# Patient Record
Sex: Male | Born: 1995 | Race: Black or African American | Hispanic: No | Marital: Single | Smoking: Current some day smoker
Health system: Southern US, Community
[De-identification: ages and names within clinical notes are randomized; demographics above are authoritative.]

## PROBLEM LIST (undated history)

## (undated) ENCOUNTER — Ambulatory Visit (HOSPITAL_COMMUNITY): Admission: EM | Payer: Medicaid Other

## (undated) ENCOUNTER — Ambulatory Visit (HOSPITAL_COMMUNITY): Admission: EM | Payer: MEDICAID | Source: Home / Self Care

## (undated) DIAGNOSIS — F259 Schizoaffective disorder, unspecified: Secondary | ICD-10-CM

## (undated) DIAGNOSIS — F319 Bipolar disorder, unspecified: Secondary | ICD-10-CM

## (undated) DIAGNOSIS — F909 Attention-deficit hyperactivity disorder, unspecified type: Secondary | ICD-10-CM

## (undated) DIAGNOSIS — R569 Unspecified convulsions: Secondary | ICD-10-CM

## (undated) DIAGNOSIS — M419 Scoliosis, unspecified: Secondary | ICD-10-CM

## (undated) HISTORY — PX: NO PAST SURGERIES: SHX2092

## (undated) HISTORY — DX: Attention-deficit hyperactivity disorder, unspecified type: F90.9

---
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
## 2014-06-12 ENCOUNTER — Emergency Department (HOSPITAL_COMMUNITY)
Admission: EM | Admit: 2014-06-12 | Discharge: 2014-06-12 | Disposition: A | Discharge status: Home / Self Care | Payer: No Typology Code available for payment source | Attending: Emergency Medicine | Admitting: Emergency Medicine

## 2014-06-12 ENCOUNTER — Encounter (HOSPITAL_COMMUNITY): Payer: Self-pay | Admitting: Emergency Medicine

## 2014-06-12 ENCOUNTER — Emergency Department (HOSPITAL_COMMUNITY): Payer: No Typology Code available for payment source

## 2014-06-12 DIAGNOSIS — IMO0002 Reserved for concepts with insufficient information to code with codable children: Secondary | ICD-10-CM | POA: Diagnosis present

## 2014-06-12 DIAGNOSIS — S43109A Unspecified dislocation of unspecified acromioclavicular joint, initial encounter: Secondary | ICD-10-CM | POA: Diagnosis not present

## 2014-06-12 DIAGNOSIS — Z79899 Other long term (current) drug therapy: Secondary | ICD-10-CM | POA: Diagnosis not present

## 2014-06-12 DIAGNOSIS — S43102A Unspecified dislocation of left acromioclavicular joint, initial encounter: Secondary | ICD-10-CM

## 2014-06-12 DIAGNOSIS — Y92410 Unspecified street and highway as the place of occurrence of the external cause: Secondary | ICD-10-CM | POA: Diagnosis not present

## 2014-06-12 DIAGNOSIS — Y9389 Activity, other specified: Secondary | ICD-10-CM | POA: Insufficient documentation

## 2014-06-12 MED ORDER — IBUPROFEN 400 MG PO TABS
800.0000 mg | ORAL_TABLET | Freq: Once | ORAL | Status: AC
  Administered 2014-06-12: 800 mg via ORAL
  Filled 2014-06-12: qty 2
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
## 2014-06-12 NOTE — ED Provider Notes (Signed)
Medical screening examination/treatment/procedure(s) were performed by non-physician practitioner and as supervising physician I was immediately available for consultation/collaboration.   EKG Interpretation None        Kristen N Ward, DO 06/12/14 2135 
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
## 2014-06-13 NOTE — ED Provider Notes (Signed)
Medical screening examination/treatment/procedure(s) were performed by non-physician practitioner and as supervising physician I was immediately available for consultation/collaboration.   EKG Interpretation None        Layla Maw Ward, DO 06/13/14 1430

## 2014-06-16 ENCOUNTER — Encounter (HOSPITAL_COMMUNITY): Payer: Self-pay | Admitting: Emergency Medicine

## 2014-06-16 ENCOUNTER — Emergency Department (INDEPENDENT_AMBULATORY_CARE_PROVIDER_SITE_OTHER)
Admission: EM | Admit: 2014-06-16 | Discharge: 2014-06-16 | Disposition: A | Discharge status: Home / Self Care | Payer: Self-pay | Source: Home / Self Care

## 2014-06-16 DIAGNOSIS — M25512 Pain in left shoulder: Secondary | ICD-10-CM

## 2014-06-16 DIAGNOSIS — M25519 Pain in unspecified shoulder: Secondary | ICD-10-CM

## 2014-06-16 DIAGNOSIS — Z5189 Encounter for other specified aftercare: Secondary | ICD-10-CM

## 2014-06-16 DIAGNOSIS — M545 Low back pain, unspecified: Secondary | ICD-10-CM

## 2014-06-16 DIAGNOSIS — Z09 Encounter for follow-up examination after completed treatment for conditions other than malignant neoplasm: Secondary | ICD-10-CM

## 2014-06-16 DIAGNOSIS — S43109A Unspecified dislocation of unspecified acromioclavicular joint, initial encounter: Secondary | ICD-10-CM

## 2014-06-16 DIAGNOSIS — S43102D Unspecified dislocation of left acromioclavicular joint, subsequent encounter: Secondary | ICD-10-CM

## 2014-06-16 DIAGNOSIS — M7918 Myalgia, other site: Secondary | ICD-10-CM

## 2014-06-16 MED ORDER — IBUPROFEN 800 MG PO TABS: 800.0000 mg | ORAL_TABLET | Freq: Three times a day (TID) | ORAL | Status: DC
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
## 2014-06-16 NOTE — Discharge Instructions (Signed)
Acromioclavicular Separation with Rehab The acromioclavicular joint is the joint between the roof of the shoulder (acromion) and the collarbone (clavicle). It is vulnerable to injury. An acromioclavicular [facility]) separation is a partial or complete tear (sprain), injury, or redness and soreness (inflammation) of the ligaments that cross the acromioclavicular joint and hold it in place. There are two ligaments in this area that are vulnerable to injury, the acromioclavicular ligament and the coracoclavicular ligament. SYMPTOMS   Tenderness and swelling, or a bump on top of the shoulder (at the [facility] joint).  Bruising (contusion) in the area within 48 hours of injury.  Loss of strength or pain when reaching over the head or across the body. CAUSES  AC separation is caused by direct trauma to the joint (falling on your shoulder) or indirect trauma (falling on an outstretched arm). RISK INCREASES WITH:  Sports that require contact or collision, throwing sports (i.e. racquetball, squash).  Poor strength and flexibility.  Previous shoulder sprain or dislocation.  Poorly fitted or padded protective equipment. PREVENTION   Warm-up and stretch properly before activity.  Maintain physical fitness:  Shoulder strength.  Shoulder flexibility.  Cardiovascular fitness.  Wear properly fitted and padded protective equipment.  Learn and use proper technique when playing sports. Have a coach correct improper technique, including falling and landing.  Apply taping, protective strapping or padding, or an adhesive bandage as recommended before practice or competition. PROGNOSIS   If treated properly, the symptoms of AC separation can be expected to go away.  If treated improperly, permanent disability may occur unless surgery is performed.  Healing time varies with type of sport and position, arm injured (dominant versus non-dominant) and severity of sprain. RELATED COMPLICATIONS  Weakness and  fatigue of the arm or shoulder are possible but uncommon.  Pain and inflammation of the [facility] joint may continue.  Prolonged healing time may be necessary if usual activities are resumed too early. This causes a susceptibility to recurrent injury.  Prolonged disability may occur.  The shoulder may remain unstable or arthritic following repeated injury. TREATMENT  Treatment initially involves ice and medication to help reduce pain and inflammation. It may also be necessary to modify your activities in order to prevent further injury. Both non-surgical and surgical interventions exist to treat AC separation. Non-surgical intervention is usually recommended and involves wearing a sling to immobilize the joint for a period of time to allow for healing. Surgical intervention is usually only considered for severe sprains of the ligament or for individuals who do not improve after 2 to 6 months of non-surgical treatment. Surgical interventions require 4 to 6 months before a return to sports is possible. MEDICATION  If pain medication is necessary, nonsteroidal anti-inflammatory medications, such as aspirin and ibuprofen, or other minor pain relievers, such as acetaminophen, are often recommended.  Do not take pain medication for 7 days before surgery.  Prescription pain relievers may be given by your caregiver. Use only as directed and only as much as you need.  Ointments applied to the skin may be helpful.  Corticosteroid injections may be given to reduce inflammation. HEAT AND COLD  Cold treatment (icing) relieves pain and reduces inflammation. Cold treatment should be applied for 10 to 15 minutes every 2 to 3 hours for inflammation and pain and immediately after any activity that aggravates your symptoms. Use ice packs or an ice massage.  Heat treatment may be used prior to performing the stretching and strengthening activities prescribed by your caregiver, physical therapist or  Warehouse manager. Use a heat pack or a warm soak. SEEK IMMEDIATE MEDICAL CARE IF:   Pain, swelling or bruising worsens despite treatment.  There is pain, numbness or coldness in the arm.  Discoloration appears in the fingernails.  New, unexplained symptoms develop. EXERCISES  RANGE OF MOTION (ROM) AND STRETCHING EXERCISES - Acromioclavicular Separation These exercises may help you when beginning to rehabilitate your injury. Your symptoms may resolve with or without further involvement from your physician, physical therapist or athletic trainer. While completing these exercises, remember:  Restoring tissue flexibility helps normal motion to return to the joints. This allows healthier, less painful movement and activity.  An effective stretch should be held for at least 30 seconds.  A stretch should never be painful. You should only feel a gentle lengthening or release in the stretched tissue. ROM - Pendulum  Bend at the waist so that your right / left arm falls away from your body. Support yourself with your opposite hand on a solid surface, such as a table or a countertop.  Your right / left arm should be perpendicular to the ground. If it is not perpendicular, you need to lean over farther. Relax the muscles in your right / left arm and shoulder as much as possible.  Gently sway your hips and trunk so they move your right / left arm without any use of your right / left shoulder muscles.  Progress your movements so that your right / left arm moves side to side, then forward and backward, and finally, both clockwise and counterclockwise.  Complete __________ repetitions in each direction. Many people use this exercise to relieve discomfort in their shoulder as well as to gain range of motion. Repeat __________ times. Complete this exercise __________ times per day. STRETCH - Flexion, Seated   Sit in a firm chair so that your right / left forearm can rest on a table or countertop. Your right /  left elbow should rest below the height of your shoulder so that your shoulder feels supported and not tense or uncomfortable.  Keeping your right / left shoulder relaxed, lean forward at your waist, allowing your right / left hand to slide forward. Bend forward until you feel a moderate stretch in your shoulder, but before you feel an increase in your pain.  Hold __________ seconds. Slowly return to your starting position. Repeat __________ times. Complete this exercise __________ times per day. STRETCH - Flexion, Standing  Stand with good posture. With an underhand grip on your right / left and an overhand grip on the opposite hand, grasp a broomstick or cane so that your hands are a little more than shoulder-width apart.  Keeping your right / left elbow straight and shoulder muscles relaxed, push the stick with your opposite hand to raise your right / left arm in front of your body and then overhead. Raise your arm until you feel a stretch in your right / left shoulder, but before you have increased shoulder pain.  Try to avoid shrugging your right / left shoulder as your arm rises by keeping your shoulder blade tucked down and toward your mid-back spine. Hold __________ seconds.  Slowly return to the starting position. Repeat __________ times. Complete this exercise __________ times per day. STRENGTHENING EXERCISES - Acromioclavicular Separation These exercises may help you when beginning to rehabilitate your injury. They may resolve your symptoms with or without further involvement from your physician, physical therapist or athletic trainer. While completing these exercises, remember:  Muscles  can gain both the endurance and the strength needed for everyday activities through controlled exercises.  Complete these exercises as instructed by your physician, physical therapist or athletic trainer. Progress the resistance and repetitions only as guided.  You may experience muscle soreness or  fatigue, but the pain or discomfort you are trying to eliminate should never worsen during these exercises. If this pain does worsen, stop and make certain you are following the directions exactly. If the pain is still present after adjustments, discontinue the exercise until you can discuss the trouble with your clinician. STRENGTH - Shoulder Abductors, Isometric   With good posture, stand or sit about 4-6 inches from a wall with your right / left side facing the wall.  Bend your right / left elbow. Gently press your right / left elbow into the wall. Increase the pressure gradually until you are pressing as hard as you can without shrugging your shoulder or increasing any shoulder discomfort.  Hold __________ seconds.  Release the tension slowly. Relax your shoulder muscles completely before you start the next repetition. Repeat __________ times. Complete this exercise __________ times per day. STRENGTH - Internal Rotators, Isometric  Keep your right / left elbow at your side and bend it 90 degrees.  Step into a door frame so that the inside of your right / left wrist can press against the door frame without your upper arm leaving your side.  Gently press your right / left wrist into the door frame as if you were trying to draw the palm of your hand to your abdomen. Gradually increase the tension until you are pressing as hard as you can without shrugging your shoulder or increasing any shoulder discomfort.  Hold __________ seconds.  Release the tension slowly. Relax your shoulder muscles completely before you the next repetition. Repeat __________ times. Complete this exercise __________ times per day.  STRENGTH - External Rotators, Isometric  Keep your right / left elbow at your side and bend it 90 degrees.  Step into a door frame so that the outside of your right / left wrist can press against the door frame without your upper arm leaving your side.  Gently press your right / left  wrist into the door frame as if you were trying to swing the back of your hand away from your abdomen. Gradually increase the tension until you are pressing as hard as you can without shrugging your shoulder or increasing any shoulder discomfort.  Hold __________ seconds.  Release the tension slowly. Relax your shoulder muscles completely before you the next repetition. Repeat __________ times. Complete this exercise __________ times per day. STRENGTH - Internal Rotators  Secure a rubber exercise band/tubing to a fixed object so that it is at the same height as your right / left elbow when you are standing or sitting on a firm surface.  Stand or sit so that the secured exercise band/tubing is at your right / left side.  Bend your elbow 90 degrees. Place a folded towel or small pillow under your right / left arm so that your elbow is a few inches away from your side.  Keeping the tension on the exercise band/tubing, pull it across your body toward your abdomen. Be sure to keep your body steady so that the movement is only coming from your shoulder rotating.  Hold __________ seconds. Release the tension in a controlled manner as you return to the starting position. Repeat __________ times. Complete this exercise __________ times per day. STRENGTH -  External Rotators  Secure a rubber exercise band/tubing to a fixed object so that it is at the same height as your right / left elbow when you are standing or sitting on a firm surface.  Stand or sit so that the secured exercise band/tubing is at your side that is not injured.  Bend your elbow 90 degrees. Place a folded towel or small pillow under your right / left arm so that your elbow is a few inches away from your side.  Keeping the tension on the exercise band/tubing, pull it away from your body, as if pivoting on your elbow. Be sure to keep your body steady so that the movement is only coming from your shoulder rotating.  Hold __________  seconds. Release the tension in a controlled manner as you return to the starting position. Repeat __________ times. Complete this exercise __________ times per day. Document Released: 09/09/2005 Document Revised: 12/02/2011 Document Reviewed: 12/22/2008 [facility] Patient Information 2015 [place], [place]. This information is not intended to replace advice given to you by your health care provider. Make sure you discuss any questions you have with your health care provider.  Back Pain, Adult Low back pain is very common. About 1 in 5 people have back pain.The cause of low back pain is rarely dangerous. The pain often gets better over time.About half of people with a sudden onset of back pain feel better in just 2 weeks. About 8 in 10 people feel better by 6 weeks.  CAUSES Some common causes of back pain include:  Strain of the muscles or ligaments supporting the spine.  Wear and tear (degeneration) of the spinal discs.  Arthritis.  Direct injury to the back. DIAGNOSIS Most of the time, the direct cause of low back pain is not known.However, back pain can be treated effectively even when the exact cause of the pain is unknown.Answering your caregiver's questions about your overall health and symptoms is one of the most accurate ways to make sure the cause of your pain is not dangerous. If your caregiver needs more information, he or she may order lab work or imaging tests (X-rays or MRIs).However, even if imaging tests show changes in your back, this usually does not require surgery. HOME CARE INSTRUCTIONS For many people, back pain returns.Since low back pain is rarely dangerous, it is often a condition that people can learn to [facility] their own.   Remain active. It is stressful on the back to sit or stand in one place. Do not sit, drive, or stand in one place for more than 30 minutes at a time. Take short walks on level surfaces as soon as pain allows.Try to increase the length of time  you walk each day.  Do not stay in bed.Resting more than 1 or 2 days can delay your recovery.  Do not avoid exercise or work.Your body is made to move.It is not dangerous to be active, even though your back may hurt.Your back will likely heal faster if you return to being active before your pain is gone.  Pay attention to your body when you bend and lift. Many people have less discomfortwhen lifting if they bend their knees, keep the load close to their bodies,and avoid twisting. Often, the most comfortable positions are those that put less stress on your recovering back.  Find a comfortable position to sleep. Use a firm mattress and lie on your side with your knees slightly bent. If you lie on your back, put a pillow under  your knees.  Only take over-the-counter or prescription medicines as directed by your caregiver. Over-the-counter medicines to reduce pain and inflammation are often the most helpful.Your caregiver may prescribe muscle relaxant drugs.These medicines help dull your pain so you can more quickly return to your normal activities and healthy exercise.  Put ice on the injured area.  Put ice in a plastic bag.  Place a towel between your skin and the bag.  Leave the ice on for 15-20 minutes, 03-04 times a day for the first 2 to 3 days. After that, ice and heat may be alternated to reduce pain and spasms.  Ask your caregiver about trying back exercises and gentle massage. This may be of some benefit.  Avoid feeling anxious or stressed.Stress increases muscle tension and can worsen back pain.It is important to recognize when you are anxious or stressed and learn ways to manage it.Exercise is a great option. SEEK MEDICAL CARE IF:  You have pain that is not relieved with rest or medicine.  You have pain that does not improve in 1 week.  You have new symptoms.  You are generally not feeling well. SEEK IMMEDIATE MEDICAL CARE IF:   You have pain that radiates from  your back into your legs.  You develop new bowel or bladder control problems.  You have unusual weakness or numbness in your arms or legs.  You develop nausea or vomiting.  You develop abdominal pain.  You feel faint. Document Released: 09/09/2005 Document Revised: 03/10/2012 Document Reviewed: 01/11/2014 [facility] Patient Information 2015 [place], [place]. This information is not intended to replace advice given to you by your health care provider. Make sure you discuss any questions you have with your health care provider.  Lumbosacral Strain Lumbosacral strain is a strain of any of the parts that make up your lumbosacral vertebrae. Your lumbosacral vertebrae are the bones that make up the lower third of your backbone. Your lumbosacral vertebrae are held together by muscles and tough, fibrous tissue (ligaments).  CAUSES  A sudden blow to your back can cause lumbosacral strain. Also, anything that causes an excessive stretch of the muscles in the low back can cause this strain. This is typically seen when people exert themselves strenuously, fall, lift heavy objects, bend, or crouch repeatedly. RISK FACTORS  Physically demanding work.  Participation in pushing or pulling sports or sports that require a sudden twist of the back (tennis, golf, baseball).  Weight lifting.  Excessive lower back curvature.  Forward-tilted pelvis.  Weak back or abdominal muscles or both.  Tight hamstrings. SIGNS AND SYMPTOMS  Lumbosacral strain may cause pain in the area of your injury or pain that moves (radiates) down your leg.  DIAGNOSIS Your health care provider can often diagnose lumbosacral strain through a physical exam. In some cases, you may need tests such as X-ray exams.  TREATMENT  Treatment for your lower back injury depends on many factors that your clinician will have to evaluate. However, most treatment will include the use of anti-inflammatory medicines. HOME CARE INSTRUCTIONS    Avoid hard physical activities (tennis, racquetball, waterskiing) if you are not in proper physical condition for it. This may aggravate or create problems.  If you have a back problem, avoid sports requiring sudden body movements. Swimming and walking are generally safer activities.  Maintain good posture.  Maintain a healthy weight.  For acute conditions, you may put ice on the injured area.  Put ice in a plastic bag.  Place a towel between your  skin and the bag.  Leave the ice on for 20 minutes, 2-3 times a day.  When the low back starts healing, stretching and strengthening exercises may be recommended. SEEK MEDICAL CARE IF:  Your back pain is getting worse.  You experience severe back pain not relieved with medicines. SEEK IMMEDIATE MEDICAL CARE IF:   You have numbness, tingling, weakness, or problems with the use of your arms or legs.  There is a change in bowel or bladder control.  You have increasing pain in any area of the body, including your belly (abdomen).  You notice shortness of breath, dizziness, or feel faint.  You feel sick to your stomach (nauseous), are throwing up (vomiting), or become sweaty.  You notice discoloration of your toes or legs, or your feet get very cold. MAKE SURE YOU:   Understand these instructions.  Will watch your condition.  Will get help right away if you are not doing well or get worse. Document Released: 06/19/2005 Document Revised: 09/14/2013 Document Reviewed: 04/28/2013 [facility] Patient Information 2015 [place], [place]. This information is not intended to replace advice given to you by your health care provider. Make sure you discuss any questions you have with your health care provider.
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
## 2014-06-18 NOTE — ED Provider Notes (Signed)
Medical screening examination/treatment/procedure(s) were performed by a resident physician or non-physician practitioner and as the supervising physician I was immediately available for consultation/collaboration.  Jayston Trevino, MD    Kiara Mcdowell S Kloi Brodman, MD 06/18/14 0851 

## 2014-06-22 ENCOUNTER — Encounter (HOSPITAL_COMMUNITY): Payer: Self-pay | Admitting: Emergency Medicine

## 2014-06-22 ENCOUNTER — Emergency Department (HOSPITAL_COMMUNITY)
Admission: EM | Admit: 2014-06-22 | Discharge: 2014-06-22 | Disposition: A | Discharge status: Home / Self Care | Payer: No Typology Code available for payment source | Attending: Emergency Medicine | Admitting: Emergency Medicine

## 2014-06-22 DIAGNOSIS — Z79899 Other long term (current) drug therapy: Secondary | ICD-10-CM | POA: Diagnosis not present

## 2014-06-22 DIAGNOSIS — M545 Low back pain, unspecified: Secondary | ICD-10-CM | POA: Diagnosis not present

## 2014-06-22 DIAGNOSIS — S43102D Unspecified dislocation of left acromioclavicular joint, subsequent encounter: Secondary | ICD-10-CM

## 2014-06-22 DIAGNOSIS — M549 Dorsalgia, unspecified: Secondary | ICD-10-CM | POA: Diagnosis present

## 2014-06-22 DIAGNOSIS — S39012D Strain of muscle, fascia and tendon of lower back, subsequent encounter: Secondary | ICD-10-CM

## 2014-06-22 DIAGNOSIS — G8911 Acute pain due to trauma: Secondary | ICD-10-CM | POA: Insufficient documentation

## 2014-06-22 DIAGNOSIS — M25519 Pain in unspecified shoulder: Secondary | ICD-10-CM | POA: Diagnosis not present

## 2014-06-22 MED ORDER — IBUPROFEN 800 MG PO TABS: 800.0000 mg | ORAL_TABLET | Freq: Three times a day (TID) | ORAL | Status: DC | PRN
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
## 2014-06-22 NOTE — ED Provider Notes (Signed)
Medical screening examination/treatment/procedure(s) were performed by non-physician practitioner and as supervising physician I was immediately available for consultation/collaboration.   EKG Interpretation None       Traveion Ruddock, MD 06/22/14 1643 
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
## 2014-06-22 NOTE — Discharge Instructions (Signed)
Please follow up with orthopedist as previously scheduled.  Follow instruction below.  Take ibuprofen as needed for pain.    Acromioclavicular Injuries The acromioclavicular [facility](AC) joint is the joint in the shoulder. There are many bands of tissue (ligaments) that surround the [facility] bones and joints. These bands of tissue can tear, which can lead to sprains and separations. The bones of the [facility] joint can also break (fracture).  HOME CARE   Put ice on the injured area.  Put ice in a plastic bag.  Place a towel between your skin and the bag.  Leave the ice on for 15-20 minutes, 03-04 times a day.  Wear your sling as told by your doctor. Remove the sling before showering and bathing. Keep the shoulder in the same place as when the sling is on. Do not lift the arm.  Gently tighten your figure-eight splint (if applied) every day. Tighten it enough to keep the shoulders held back. There should be room to place your finger between your body and the strap. Loosen the splint right away if you lose feeling (numbness) or have tingling in your hands.  Only take medicine as told by your doctor.  Keep all follow-up visits with your doctor. GET HELP RIGHT AWAY IF:   Your medicine does not help your pain.  You have more puffiness (swelling) or your bruising gets worse rather than better.  You were unable to follow up as told by your doctor.  You have tingling or lose even more feeling in your arm, forearm, or hand.  Your arm is cold or pale.  You have more pain in the hand, forearm, or fingers. MAKE SURE YOU:   Understand these instructions.  Will watch your condition.  Will get help right away if you are not doing well or get worse. Document Released: 02/27/2010 Document Revised: 12/02/2011 Document Reviewed: 02/27/2010 [facility] Patient Information 2015 [place], [place]. This information is not intended to replace advice given to you by your health care provider. Make sure you discuss any  questions you have with your health care provider.

## 2014-08-25 ENCOUNTER — Ambulatory Visit: Payer: Self-pay

## 2016-05-23 ENCOUNTER — Emergency Department (HOSPITAL_COMMUNITY)
Admission: EM | Admit: 2016-05-23 | Discharge: 2016-05-23 | Disposition: A | Discharge status: Home / Self Care | Payer: No Typology Code available for payment source | Attending: Emergency Medicine | Admitting: Emergency Medicine

## 2016-05-23 ENCOUNTER — Emergency Department (HOSPITAL_COMMUNITY)
Admission: EM | Admit: 2016-05-23 | Discharge: 2016-05-23 | Disposition: A | Discharge status: Home / Self Care | Payer: Self-pay | Attending: Emergency Medicine | Admitting: Emergency Medicine

## 2016-05-23 ENCOUNTER — Encounter (HOSPITAL_COMMUNITY): Payer: Self-pay

## 2016-05-23 ENCOUNTER — Encounter (HOSPITAL_COMMUNITY): Payer: Self-pay | Admitting: Emergency Medicine

## 2016-05-23 DIAGNOSIS — M546 Pain in thoracic spine: Secondary | ICD-10-CM

## 2016-05-23 DIAGNOSIS — Y939 Activity, unspecified: Secondary | ICD-10-CM | POA: Insufficient documentation

## 2016-05-23 DIAGNOSIS — Y92410 Unspecified street and highway as the place of occurrence of the external cause: Secondary | ICD-10-CM | POA: Diagnosis not present

## 2016-05-23 DIAGNOSIS — R569 Unspecified convulsions: Secondary | ICD-10-CM | POA: Insufficient documentation

## 2016-05-23 DIAGNOSIS — M79602 Pain in left arm: Secondary | ICD-10-CM | POA: Diagnosis not present

## 2016-05-23 DIAGNOSIS — Y999 Unspecified external cause status: Secondary | ICD-10-CM | POA: Insufficient documentation

## 2016-05-23 DIAGNOSIS — Z79899 Other long term (current) drug therapy: Secondary | ICD-10-CM | POA: Insufficient documentation

## 2016-05-23 MED ORDER — NAPROXEN 500 MG PO TABS: 500.0000 mg | ORAL_TABLET | Freq: Two times a day (BID) | ORAL | 0 refills | Status: DC

## 2016-05-23 MED ORDER — METHOCARBAMOL 500 MG PO TABS: 500.0000 mg | ORAL_TABLET | Freq: Two times a day (BID) | ORAL | 0 refills | Status: DC
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
## 2016-05-23 NOTE — ED Notes (Signed)
Pt. Didn't want pain meds administered int he hospital. Prescriptions given and questions answered

## 2016-05-23 NOTE — ED Notes (Signed)
Back pain is soreness under the L scapula

## 2016-05-23 NOTE — ED Notes (Signed)
Mother at bedside and advised nurse that she feels that the pt. did not overdosed on Percocet , concerned about possible seizure or other illness.

## 2016-05-23 NOTE — Discharge Instructions (Signed)
Please establish neurology follow-up care with the group of your choice.
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
## 2016-05-24 ENCOUNTER — Encounter (HOSPITAL_COMMUNITY): Payer: Self-pay | Admitting: *Deleted

## 2016-05-24 ENCOUNTER — Observation Stay (HOSPITAL_COMMUNITY)
Admission: EM | Admit: 2016-05-24 | Discharge: 2016-05-24 | Discharge status: Against Medical Advice | Payer: Self-pay | Attending: Internal Medicine | Admitting: Internal Medicine

## 2016-05-24 ENCOUNTER — Observation Stay (HOSPITAL_COMMUNITY): Payer: Self-pay

## 2016-05-24 ENCOUNTER — Emergency Department (HOSPITAL_COMMUNITY): Payer: Self-pay

## 2016-05-24 DIAGNOSIS — F1290 Cannabis use, unspecified, uncomplicated: Secondary | ICD-10-CM | POA: Diagnosis present

## 2016-05-24 DIAGNOSIS — R569 Unspecified convulsions: Principal | ICD-10-CM

## 2016-05-24 DIAGNOSIS — E860 Dehydration: Secondary | ICD-10-CM | POA: Diagnosis present

## 2016-05-24 DIAGNOSIS — N179 Acute kidney failure, unspecified: Secondary | ICD-10-CM | POA: Diagnosis present

## 2016-05-24 HISTORY — DX: Scoliosis, unspecified: M41.9

## 2016-05-24 LAB — COMPREHENSIVE METABOLIC PANEL
ALT: 18 U/L (ref 17–63)
AST: 36 U/L (ref 15–41)
Albumin: 4.7 g/dL (ref 3.5–5.0)
Alkaline Phosphatase: 42 U/L (ref 38–126)
Anion gap: 14 (ref 5–15)
BILIRUBIN TOTAL: 1.5 mg/dL — AB (ref 0.3–1.2)
BUN: 7 mg/dL (ref 6–20)
CALCIUM: 9.7 mg/dL (ref 8.9–10.3)
CHLORIDE: 99 mmol/L — AB (ref 101–111)
CO2: 25 mmol/L (ref 22–32)
CREATININE: 1.51 mg/dL — AB (ref 0.61–1.24)
Glucose, Bld: 118 mg/dL — ABNORMAL HIGH (ref 65–99)
Potassium: 3.6 mmol/L (ref 3.5–5.1)
Sodium: 138 mmol/L (ref 135–145)
TOTAL PROTEIN: 7.5 g/dL (ref 6.5–8.1)

## 2016-05-24 LAB — CBC WITH DIFFERENTIAL/PLATELET
Basophils Absolute: 0 10*3/uL (ref 0.0–0.1)
Basophils Relative: 0 %
EOS PCT: 1 %
Eosinophils Absolute: 0.1 10*3/uL (ref 0.0–0.7)
HCT: 46.2 % (ref 39.0–52.0)
Hemoglobin: 15.3 g/dL (ref 13.0–17.0)
LYMPHS ABS: 2.5 10*3/uL (ref 0.7–4.0)
LYMPHS PCT: 19 %
MCH: 27.3 pg (ref 26.0–34.0)
MCHC: 33.1 g/dL (ref 30.0–36.0)
MCV: 82.5 fL (ref 78.0–100.0)
MONO ABS: 1 10*3/uL (ref 0.1–1.0)
Monocytes Relative: 8 %
Neutro Abs: 9.2 10*3/uL — ABNORMAL HIGH (ref 1.7–7.7)
Neutrophils Relative %: 72 %
PLATELETS: 197 10*3/uL (ref 150–400)
RBC: 5.6 MIL/uL (ref 4.22–5.81)
RDW: 12.9 % (ref 11.5–15.5)
WBC: 12.7 10*3/uL — AB (ref 4.0–10.5)

## 2016-05-24 LAB — RAPID URINE DRUG SCREEN, HOSP PERFORMED
AMPHETAMINES: NOT DETECTED
Barbiturates: NOT DETECTED
Benzodiazepines: POSITIVE — AB
Cocaine: NOT DETECTED
Opiates: POSITIVE — AB
Tetrahydrocannabinol: POSITIVE — AB

## 2016-05-24 LAB — CK: Total CK: 1125 U/L — ABNORMAL HIGH (ref 49–397)

## 2016-05-24 LAB — ETHANOL

## 2016-05-24 MED ORDER — SODIUM CHLORIDE 0.9% FLUSH: 3.0000 mL | Freq: Two times a day (BID) | INTRAVENOUS | Status: DC

## 2016-05-24 MED ORDER — ACETAMINOPHEN 650 MG RE SUPP: 650.0000 mg | Freq: Four times a day (QID) | RECTAL | Status: DC | PRN

## 2016-05-24 MED ORDER — SODIUM CHLORIDE 0.9 % IV BOLUS (SEPSIS)
1000.0000 mL | Freq: Once | INTRAVENOUS | Status: AC
  Administered 2016-05-24: 1000 mL via INTRAVENOUS

## 2016-05-24 MED ORDER — LORAZEPAM 2 MG/ML IJ SOLN: 0.5000 mg | Freq: Once | INTRAMUSCULAR | Status: DC

## 2016-05-24 MED ORDER — ENOXAPARIN SODIUM 40 MG/0.4ML ~~LOC~~ SOLN
40.0000 mg | SUBCUTANEOUS | Status: DC
  Administered 2016-05-24: 40 mg via SUBCUTANEOUS
  Filled 2016-05-24: qty 0.4

## 2016-05-24 MED ORDER — ACETAMINOPHEN 325 MG PO TABS
650.0000 mg | ORAL_TABLET | Freq: Four times a day (QID) | ORAL | Status: DC | PRN
  Administered 2016-05-24: 650 mg via ORAL
  Filled 2016-05-24: qty 2

## 2016-05-24 MED ORDER — SODIUM CHLORIDE 0.9 % IV SOLN: INTRAVENOUS | Status: DC

## 2016-05-24 NOTE — Progress Notes (Signed)
Patient not cooperating with staff has to be  Reminded not to get up without help and to keep his monitor on , very restless wanting to leave went to MRI and refused to do test so he was sent back will place in camera room as ordered mother at bedside and tries to get son to cooperate with us
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
## 2016-05-24 NOTE — Progress Notes (Signed)
Pt brought down to MRI for brain scan.  Pt was difficult to refocus, would not follow directions for more than a couple of minutes.  Pt was instructed how important it was to hold still for exam but he could not maintain this for very long.  Finally he asked to come out of the scanner and he wanted to stand up.  Again I instructed him about the need to hold still and he asked if he needed to do the pictures.  I told him only he could decide that, but in order to do the scan he would have to lay still.  He said then all he wanted to do was go back to his room and refused further imaging.
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
## 2016-05-24 NOTE — ED Triage Notes (Signed)
Pt to ED by EMS after mother found pt unresponsive in the bathroom. Pt combative with EMS, pinpoint pupils, given 5mg  versed and 5 mg haldol. Pt alert on arrival, nonverbal, appears postical. Pt seen here earlier tonight after overdosing on percocets.

## 2016-05-24 NOTE — ED Notes (Signed)
Pt taken to CT.

## 2016-05-24 NOTE — Progress Notes (Signed)
Patient  Pulled out IV insisting that he be let out of here he was ready to go. Family in room , mother at bedside. MD called by Charge nurse Daphene Tom-Johnson and orders to allow patient to go AMA Dr. Hali MarryEmopke Courage,  Papers were given to him and signed and witnessed .
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
## 2016-05-24 NOTE — ED Notes (Signed)
Pt had a seizure earlier in the evening, was seen in the ED sent home then his mother found him having another seizure early this morning.  When EMS arrived, he was combative/confused he is now able to be woken easier and is alert and oriented at this time. Mother sitting bedside.  He is being admitted for new-onset seizures.

## 2016-05-24 NOTE — Discharge Instructions (Signed)

## 2016-05-24 NOTE — Care Management Note (Signed)
Case Management Note  Patient Details  Name: Dylan CarinaChance E Mccain MRN: [id number] Date of Birth: 1996-05-29  Subjective/Objective:  Pt in with seizures. He is from home with his mom.                  Action/Plan: Plan is to discharge home when medically ready. CM following for d/c needs.   Expected Discharge Date:                  Expected Discharge Plan:  Home/Self Care  In-House Referral:     Discharge planning Services     Post Acute Care Choice:    Choice offered to:     DME Arranged:    DME Agency:     HH Arranged:    HH Agency:     Status of Service:  In process, will continue to follow  If discussed at Long Length of Stay Meetings, dates discussed:    Additional Comments:  Kermit BaloKelli F Early Ord, RN 05/24/2016, 12:05 PM

## 2016-06-13 NOTE — Discharge Summary (Signed)
Physician Discharge Summary  Patient ID: Dylan Moon Thelin MRN: [id number] DOB/AGE: 1996-09-05 20 y.o.  Admit date: 05/24/2016 Discharge date: 06/13/2016  Admission Diagnoses: Possible seizures, AKI secondary to dehydration  Discharge Diagnoses:  Patient left AGAINST MEDICAL ADVICE on same date of admission.  Please see Full  H&P for some date of service  Principal Problem:   Seizure (HCC) Active Problems:   Dehydration, mild   AKI (acute kidney injury) (HCC)   Marijuana use   Discharged Condition: Patient left AMA,  Please see Full  H&P for some date of service   Hospital Course: Patient was admitted on 05/24/2016 with possible seizures and AKI secondary to dehydration despite repeated persuasion from me Dr. Shon Halecourage Koa Palla  and also repeated persuasion from the mid-level [employer] Pt left AMA on same date. He put out his IV and insisted on going home despite repeated persuasion to the contrary. Patient's mother and family members were in the room.   Please see Full  H&P for some date of service   Discharge Exam: Blood pressure 127/66, pulse 95, temperature 98.6 F (37 C), temperature source Oral, resp. rate 18, height 5\' 11"  (1.803 m), weight 68 kg (150 lb), SpO2 100 %. No discharge exam performed as patient left AGAINST MEDICAL ADVICE  . Please see Full  H&P for some date of service  Disposition: 01-Home or Self Care     Medication List    You have not been prescribed any medications.     Patient left AGAINST MEDICAL ADVICE on same date of admission.  Please see Full  H&P for some date of service   Signed: Shon HaleEMOKPAE, Shalay Carder, MD   [facility] LLC[facility]OKPAE, Stacey Sago 06/13/2016, 4:42 PM

## 2016-08-09 ENCOUNTER — Telehealth: Payer: Self-pay | Admitting: Developmental - Behavioral Pediatrics

## 2016-08-09 ENCOUNTER — Telehealth: Payer: Self-pay | Admitting: *Deleted

## 2016-08-09 NOTE — Telephone Encounter (Signed)
TC to mom. LVM that Dr. Inda CokeGertz is trained to see children in her practice. Advised she can call [facility] or [facility] to establish care.

## 2016-08-09 NOTE — Telephone Encounter (Signed)
[name] mom called back stating she missed provider's call. Explained mom provider's instructions to [facility] care. She said that pt used to be in the care of Dr. Inda CokeGertz for ADHD at the other practice when pt was around 20 y/o. Gave mom other practice phone number and she understood, however, wants to know if Dr. Inda CokeGertz can give her names of ADHD specialist.

## 2016-08-09 NOTE — Telephone Encounter (Signed)
Please call this mom and let her know that Dr. Inda CokeGertz is trained to see children in her practice.  They can call [facility] or [facility] to establish care.

## 2016-08-09 NOTE — Telephone Encounter (Signed)
VM from mom, requesting call back from Dr. Inda CokeGertz re: pt.   No notes in Epic that Dr. Inda CokeGertz has seen pt.   Will route to provider.

## 2017-01-21 ENCOUNTER — Encounter (HOSPITAL_COMMUNITY): Payer: Self-pay

## 2017-01-21 ENCOUNTER — Emergency Department (HOSPITAL_COMMUNITY)
Admission: EM | Admit: 2017-01-21 | Discharge: 2017-01-21 | Disposition: A | Discharge status: Home / Self Care | Payer: BLUE CROSS/BLUE SHIELD | Attending: Emergency Medicine | Admitting: Emergency Medicine

## 2017-01-21 DIAGNOSIS — Z79899 Other long term (current) drug therapy: Secondary | ICD-10-CM | POA: Insufficient documentation

## 2017-01-21 DIAGNOSIS — R569 Unspecified convulsions: Secondary | ICD-10-CM | POA: Insufficient documentation

## 2017-01-21 HISTORY — DX: Unspecified convulsions: R56.9

## 2017-01-21 MED ORDER — LEVETIRACETAM 500 MG PO TABS: 500.0000 mg | ORAL_TABLET | Freq: Two times a day (BID) | ORAL | 0 refills | Status: DC

## 2017-01-21 MED ORDER — LEVETIRACETAM 500 MG PO TABS: 500.0000 mg | ORAL_TABLET | Freq: Every day | ORAL | 0 refills | Status: DC

## 2017-01-21 MED ORDER — LEVETIRACETAM 500 MG PO TABS
1500.0000 mg | ORAL_TABLET | Freq: Once | ORAL | Status: AC
  Administered 2017-01-21: 1500 mg via ORAL
  Filled 2017-01-21: qty 3

## 2017-01-21 NOTE — ED Triage Notes (Signed)
Pt. Coming from home via [facility] for seizure. Pt. Hx of same and supposed to be taking 500 mg kepra daily. Pt. Has not had medication in a month because he left it in [place]. Pt. Asking to leave hospital at this time, but is willing to talk to EDP first. Pt. Denies any pain. Pt. Aox4.

## 2017-01-21 NOTE — Discharge Instructions (Signed)
Take your medications daily as prescribed. Call your neurologist and set up a follow-up appointment this week for reevaluation and medication management. Return to the emergency department if any concerning symptoms develop.

## 2017-01-21 NOTE — ED Provider Notes (Signed)
[facility]EMER[facility]ENCY DEPT Provider Note   CSN: [id number] Arrival date & time: 01/21/17  1241     History   Chief Complaint Chief Complaint  Patient presents with  . Seizures    HPI Dylan Moon is a 21 y.o. male with seizure disorder who presents today with acute onset seizure earlier today. He states "I felt weird in the shower today ", got out of the shower and walked into the bedroom and began seizing 1 hour PTA. States it was witnessed by his girlfriend. Girlfriend states seizure in which he was "jerking" lasted about 50 seconds before resolving. Denies hitting his head. He has been ambulatory since. He states his first seizure was 6 months ago here. He had another seizure in [place], where he was evaluated and Keppra  BID was initiated. He has not seen a neurologist here. He states he left his medication in [place] 1 month ago, and has not been taking his medication as a result. He endorses mild right-sided headache but otherwise has no complaints.   Denies pain, numbness, tingling, weakness, bowel/bladder, incontinence, CP/SOB, abd pain, n/v.    HPI  Past Medical History:  Diagnosis Date  . Scoliosis   . Seizures [facility])     Patient Active Problem List   Diagnosis Date Noted  . Seizure (HCC) 05/24/2016  . Dehydration, mild 05/24/2016  . AKI (acute kidney injury) (HCC) 05/24/2016  . Marijuana use 05/24/2016    Past Surgical History:  Procedure Laterality Date  . NO PAST SURGERIES         Home Medications    Prior to Admission medications   Medication Sig Start Date End Date Taking? Authorizing Provider  levETIRAcetam (KEPPRA) 500 MG tablet Take 1 tablet (500 mg total) by mouth 2 (two) times daily. 01/21/17 02/20/17  Jeanie Sewer, PA-C    Family History Family History  Problem Relation Age of Onset  . Diabetes Mother   . Hypertension Mother   . Cancer Other     Social History Social History  Substance Use Topics  . Smoking status: Never Smoker  .  Smokeless tobacco: Never Used  . Alcohol use No     Allergies   Patient has no known allergies.   Review of Systems Review of Systems  Constitutional: Negative for chills and fever.  Respiratory: Negative for shortness of breath.   Cardiovascular: Negative for chest pain.  Gastrointestinal: Negative for abdominal pain, nausea and vomiting.  Genitourinary: Negative for hematuria.  Musculoskeletal: Negative for back pain and neck pain.  Neurological: Positive for seizures and headaches. Negative for weakness and numbness.  Psychiatric/Behavioral: Negative for confusion.     Physical Exam Updated Vital Signs BP 135/80 (BP Location: Left Arm)   Pulse 87   Temp 98.2 F (36.8 C) (Oral)   Resp 17   Ht 6' (1.829 m)   Wt 68 kg   SpO2 97%   BMI 20.34 kg/m   Physical Exam  Constitutional: He is oriented to person, place, and time. He appears well-developed and well-nourished. No distress.  HENT:  Head: Normocephalic and atraumatic.  No battle's signs, no raccoon's eyes, no rhinorrhea  Eyes: Conjunctivae and EOM are normal. Pupils are equal, round, and reactive to light. Right eye exhibits no discharge. Left eye exhibits no discharge.  Neck: Normal range of motion. Neck supple. No JVD present. No tracheal deviation present.  Cardiovascular: Normal rate, regular rhythm, normal heart sounds and intact distal pulses.   No murmur heard. 2+ radial and  DP/PT pulses bl, negative Homan's bl   Pulmonary/Chest: Effort normal and breath sounds normal. No respiratory distress.  Abdominal: Soft. Bowel sounds are normal. He exhibits no distension. There is no tenderness.  Musculoskeletal: Normal range of motion. He exhibits no edema, tenderness or deformity.  No midline spine tenderness to palpation. No paraspinal muscle spasm. Full ROM of BUE and BLE and LSP. 5/5 strength BUE and BLE with good grip strength.   Neurological: He is alert and oriented to person, place, and time. No cranial  nerve deficit or sensory deficit.  Fluent speech, no facial droop, sensation intact globally, normal gait, and patient able to heel walk and toe walk without difficulty.   Skin: Skin is warm and dry. Capillary refill takes less than 2 seconds. He is not diaphoretic.  Psychiatric: He has a normal mood and affect. His behavior is normal.  Nursing note and vitals reviewed.    ED Treatments / Results  Labs (all labs ordered are listed, but only abnormal results are displayed) Labs Reviewed - No data to display  EKG  EKG Interpretation None       Radiology No results found.  Procedures Procedures (including critical care time)  Medications Ordered in ED Medications  levETIRAcetam (KEPPRA) tablet 1,500 mg (1,500 mg Oral Given 01/21/17 1328)     Initial Impression / Assessment and Plan / ED Course  I have reviewed the triage vital signs and the nursing notes.  Pertinent labs & imaging results that were available during my care of the patient were reviewed by me and considered in my medical decision making (see chart for details).     Patient with known seizure disorder not managed by neurology here and has not been taking his medication for one month presents today with seizure earlier today. Patient afebrile, vital signs stable. Patient with no evidence of focal neuro deficits on physical exam and is at mental baseline. No lab work or imaging indicated at this time.  Patient is advised to establish care with neurologist in regards to today's event.  Provided loading dose of Keppra today. Provided Rx for 1 month supply in the interim to get patient to neurology visit. Spoke with patient and family in detail about driving restrictions until cleared by a neurologist.  Patient verbalizes understanding.  Answered all questions.  Patient is hemodynamically stable and in no acute distress prior to discharge.  Final Clinical Impressions(s) / ED Diagnoses   Final diagnoses:  Seizure [facility])     New Prescriptions Discharge Medication List as of 01/21/2017  1:31 PM       Jeanie Sewer, PA-C 01/21/17 1355    Nira Conn, MD 01/21/17 1737

## 2017-03-14 ENCOUNTER — Emergency Department (HOSPITAL_COMMUNITY)
Admission: EM | Admit: 2017-03-14 | Discharge: 2017-03-14 | Disposition: A | Discharge status: Home / Self Care | Payer: BLUE CROSS/BLUE SHIELD | Attending: Emergency Medicine | Admitting: Emergency Medicine

## 2017-03-14 ENCOUNTER — Encounter (HOSPITAL_COMMUNITY): Payer: Self-pay | Admitting: Emergency Medicine

## 2017-03-14 DIAGNOSIS — F919 Conduct disorder, unspecified: Secondary | ICD-10-CM | POA: Diagnosis not present

## 2017-03-14 DIAGNOSIS — Z5321 Procedure and treatment not carried out due to patient leaving prior to being seen by health care provider: Secondary | ICD-10-CM | POA: Insufficient documentation

## 2017-03-14 DIAGNOSIS — Z9114 Patient's other noncompliance with medication regimen: Secondary | ICD-10-CM | POA: Insufficient documentation

## 2017-03-14 DIAGNOSIS — R569 Unspecified convulsions: Secondary | ICD-10-CM | POA: Diagnosis present

## 2017-03-14 NOTE — ED Triage Notes (Signed)
Per EMS-states patient was laying in bed when he had a witnessed seizure lasting one minute-post ictal when EMS arrived-states combative at that time and could not be placed-oriented x 4 during transport-patient admitted to not taking medication

## 2017-03-14 NOTE — ED Notes (Signed)
Patient states he "ain't staying"-states he will take his meds at home-states he is fine-patient ambulated with his child and other family members to lobby-states he did not want to stay and be evaluated

## 2017-03-14 NOTE — ED Notes (Signed)
Bed: WA22 Expected date:  Expected time:  Means of arrival:  Comments: EMS-SZ 

## 2017-03-31 ENCOUNTER — Encounter: Payer: Self-pay | Admitting: Neurology

## 2017-03-31 ENCOUNTER — Ambulatory Visit (INDEPENDENT_AMBULATORY_CARE_PROVIDER_SITE_OTHER): Payer: BLUE CROSS/BLUE SHIELD | Admitting: Neurology

## 2017-03-31 ENCOUNTER — Encounter (INDEPENDENT_AMBULATORY_CARE_PROVIDER_SITE_OTHER): Payer: Self-pay

## 2017-03-31 VITALS — BP 114/70 | HR 59 | Ht 72.0 in | Wt 163.5 lb

## 2017-03-31 DIAGNOSIS — Z5181 Encounter for therapeutic drug level monitoring: Secondary | ICD-10-CM | POA: Diagnosis not present

## 2017-03-31 DIAGNOSIS — R569 Unspecified convulsions: Secondary | ICD-10-CM | POA: Diagnosis not present

## 2017-03-31 MED ORDER — ALPRAZOLAM 0.5 MG PO TABS: ORAL_TABLET | ORAL | 0 refills | Status: DC

## 2017-03-31 MED ORDER — LEVETIRACETAM 500 MG PO TABS: 500.0000 mg | ORAL_TABLET | Freq: Two times a day (BID) | ORAL | 3 refills | Status: DC

## 2017-03-31 NOTE — Patient Instructions (Signed)
   We will get MRI of the brain and get an EEG study. No driving for at least 6 months.
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
## 2017-04-01 ENCOUNTER — Telehealth: Payer: Self-pay | Admitting: *Deleted

## 2017-04-01 LAB — COMPREHENSIVE METABOLIC PANEL
ALBUMIN: 4.6 g/dL (ref 3.5–5.5)
ALT: 14 IU/L (ref 0–44)
AST: 16 IU/L (ref 0–40)
Albumin/Globulin Ratio: 1.9 (ref 1.2–2.2)
Alkaline Phosphatase: 50 IU/L (ref 39–117)
BUN / CREAT RATIO: 5 — AB (ref 9–20)
BUN: 6 mg/dL (ref 6–20)
Bilirubin Total: 1 mg/dL (ref 0.0–1.2)
CALCIUM: 9.7 mg/dL (ref 8.7–10.2)
CO2: 24 mmol/L (ref 20–29)
Chloride: 98 mmol/L (ref 96–106)
Creatinine, Ser: 1.13 mg/dL (ref 0.76–1.27)
GFR calc non Af Amer: 92 mL/min/{1.73_m2} (ref >59)
GFR, EST AFRICAN AMERICAN: 106 mL/min/{1.73_m2} (ref >59)
GLUCOSE: 87 mg/dL (ref 65–99)
Globulin, Total: 2.4 g/dL (ref 1.5–4.5)
Potassium: 4.9 mmol/L (ref 3.5–5.2)
Sodium: 139 mmol/L (ref 134–144)
TOTAL PROTEIN: 7 g/dL (ref 6.0–8.5)

## 2017-04-01 NOTE — Telephone Encounter (Signed)
-----   Message from York Spanielharles K Willis, MD sent at 04/01/2017  7:51 AM EDT -----  The blood work results are unremarkable. Please call the patient.  ----- Message ----- From: Nell RangeInterface, [facility] Lab Results In Sent: 04/01/2017   7:45 AM To: York Spanielharles K Willis, MD

## 2017-04-01 NOTE — Telephone Encounter (Signed)
Called and spoke with patient mother, [place] about unremarkable labs per CW,MD note. She verbalized understanding. She will let pt know.

## 2017-04-03 ENCOUNTER — Ambulatory Visit (INDEPENDENT_AMBULATORY_CARE_PROVIDER_SITE_OTHER): Payer: BLUE CROSS/BLUE SHIELD | Admitting: Neurology

## 2017-04-03 ENCOUNTER — Telehealth: Payer: Self-pay | Admitting: Neurology

## 2017-04-03 DIAGNOSIS — R569 Unspecified convulsions: Secondary | ICD-10-CM | POA: Diagnosis not present

## 2017-04-03 NOTE — Procedures (Signed)
    History:  Dylan Moon is a 21 year old patient with a history of seizures that have occurred since August 2017. The last seizure occurred on 03/14/2017. The patient may get lightheaded, sweaty, have slurred speech and then go into a generalized jerking event with tongue biting. The patient is being evaluated for these events.  This is a routine EEG. No skull defects are noted. Medications include Keppra.   EEG classification: Normal awake and drowsy  Description of the recording: The background rhythms of this recording consists of a fairly well modulated medium amplitude alpha rhythm of 9 Hz that is reactive to eye opening and closure. As the record progresses, the patient appears to remain in the waking state throughout the recording. Photic stimulation was performed, resulting in a bilateral and symmetric photic driving response. Hyperventilation was also performed, resulting in a minimal buildup of the background rhythm activities without significant slowing seen. Toward the end of the recording, the patient enters the drowsy state with slight symmetric slowing seen. The patient never enters stage II sleep. At no time during the recording does there appear to be evidence of spike or spike wave discharges or evidence of focal slowing. EKG monitor shows no evidence of cardiac rhythm abnormalities with a heart rate of 54.  Impression: This is a normal EEG recording in the waking and drowsy state. No evidence of ictal or interictal discharges are seen.

## 2017-04-03 NOTE — Telephone Encounter (Signed)
I called the patient, talk with the mother. The EEG study was normal. MRI of the brain is pending.

## 2017-04-13 ENCOUNTER — Telehealth: Payer: Self-pay | Admitting: Neurology

## 2017-04-13 ENCOUNTER — Ambulatory Visit
Admission: RE | Admit: 2017-04-13 | Discharge: 2017-04-13 | Disposition: A | Discharge status: Home / Self Care | Payer: BLUE CROSS/BLUE SHIELD | Source: Ambulatory Visit | Attending: Neurology | Admitting: Neurology

## 2017-04-13 DIAGNOSIS — R569 Unspecified convulsions: Secondary | ICD-10-CM

## 2017-04-13 MED ORDER — GADOBENATE DIMEGLUMINE 529 MG/ML IV SOLN
15.0000 mL | Freq: Once | INTRAVENOUS | Status: AC | PRN
  Administered 2017-04-13: 15 mL via INTRAVENOUS

## 2017-04-13 NOTE — Telephone Encounter (Signed)
I called the patient. MRI of the brain is normal.   MRI brain 04/11/17:  IMPRESSION:  Normal MRI scan of the brain with and without contrast

## 2017-05-23 ENCOUNTER — Encounter (HOSPITAL_COMMUNITY): Payer: Self-pay | Admitting: *Deleted

## 2017-05-23 ENCOUNTER — Emergency Department (HOSPITAL_COMMUNITY)
Admission: EM | Admit: 2017-05-23 | Discharge: 2017-05-23 | Disposition: A | Discharge status: Home / Self Care | Payer: BLUE CROSS/BLUE SHIELD | Attending: Emergency Medicine | Admitting: Emergency Medicine

## 2017-05-23 DIAGNOSIS — Z202 Contact with and (suspected) exposure to infections with a predominantly sexual mode of transmission: Secondary | ICD-10-CM | POA: Insufficient documentation

## 2017-05-23 DIAGNOSIS — Z711 Person with feared health complaint in whom no diagnosis is made: Secondary | ICD-10-CM

## 2017-05-23 DIAGNOSIS — Z79899 Other long term (current) drug therapy: Secondary | ICD-10-CM | POA: Diagnosis not present

## 2017-05-23 MED ORDER — CEFTRIAXONE SODIUM 250 MG IJ SOLR
250.0000 mg | Freq: Once | INTRAMUSCULAR | Status: AC
  Administered 2017-05-23: 250 mg via INTRAMUSCULAR
  Filled 2017-05-23: qty 250

## 2017-05-23 MED ORDER — AZITHROMYCIN 250 MG PO TABS
1000.0000 mg | ORAL_TABLET | Freq: Once | ORAL | Status: AC
Start: 2017-05-23 — End: 2017-05-23
  Administered 2017-05-23: 1000 mg via ORAL
  Filled 2017-05-23: qty 4

## 2017-05-23 MED ORDER — STERILE WATER FOR INJECTION IJ SOLN
INTRAMUSCULAR | Status: AC
  Filled 2017-05-23: qty 10

## 2017-05-23 NOTE — ED Triage Notes (Signed)
Pt requesting to be tested for STD. Denies any symptoms.

## 2017-05-23 NOTE — ED Notes (Addendum)
Girlfriend tested positive for Chlamydia yesterday. Pt wants treatment. Denies sx.

## 2017-05-23 NOTE — ED Provider Notes (Signed)
[facility]ENCY DEPT Provider Note   CSN: [id number] Arrival date & time: 05/23/17  2440     History   Chief Complaint Chief Complaint  Patient presents with  . SEXUALLY TRANSMITTED DISEASE    HPI Dylan Moon is a 21 y.o. male.  Dylan Moon is a 21 y.o. Male who presents to the ED requesting STD check after his girlfriend was diagnosed with chlamydia. Patient tells me that his girlfriend was diagnosed Chlamydia about 2 days ago. He denies any symptoms of an STD, but he would like to be checked and treated. He tells me he has been having unprotected intercourse. He denies fevers, rashes, penile pain, testicular pain, penile discharge, mouth sores or other complaints.   The history is provided by the patient and medical records. No language interpreter was used.    Past Medical History:  Diagnosis Date  . ADHD   . Scoliosis   . Seizures [facility])     Patient Active Problem List   Diagnosis Date Noted  . Seizure (HCC) 05/24/2016  . Dehydration, mild 05/24/2016  . AKI (acute kidney injury) (HCC) 05/24/2016  . Marijuana use 05/24/2016    Past Surgical History:  Procedure Laterality Date  . NO PAST SURGERIES         Home Medications    Prior to Admission medications   Medication Sig Start Date End Date Taking? Authorizing Provider  ALPRAZolam Prudy Feeler) 0.5 MG tablet Take 2 tablets approximately 45 minutes prior to the MRI study, take a third tablet if needed. 03/31/17   York Spaniel, MD  levETIRAcetam (KEPPRA) 500 MG tablet Take 1 tablet (500 mg total) by mouth 2 (two) times daily. 03/31/17   York Spaniel, MD    Family History Family History  Problem Relation Age of Onset  . Diabetes Mother   . Hypertension Mother   . Cancer Other   . Diabetes Father   . Seizures Maternal Grandfather     Social History Social History  Substance Use Topics  . Smoking status: Never Smoker  . Smokeless tobacco: Never Used  . Alcohol use No     Allergies     Patient has no known allergies.   Review of Systems Review of Systems  Constitutional: Negative for fever.  HENT: Negative for mouth sores.   Gastrointestinal: Negative for abdominal pain, nausea and vomiting.  Genitourinary: Negative for decreased urine volume, discharge, dysuria, frequency, penile pain, penile swelling, testicular pain and urgency.  Skin: Negative for rash.     Physical Exam Updated Vital Signs BP (!) 145/79 (BP Location: Right Arm)   Pulse 68   Temp 98.2 F (36.8 C) (Oral)   Resp 18   SpO2 100%   Physical Exam  Constitutional: He appears well-developed and well-nourished. No distress.  HENT:  Head: Normocephalic and atraumatic.  Eyes: Right eye exhibits no discharge. Left eye exhibits no discharge.  Pulmonary/Chest: Effort normal. No respiratory distress.  Abdominal: Soft. There is no tenderness. There is no guarding.  Genitourinary: Penis normal. No penile tenderness.  Genitourinary Comments: GU exam with male RN chaperone. No penile or testicular lesions or rashes. No penile or testicular tenderness to palpation. No penile discharge.  Neurological: He is alert. Coordination normal.  Skin: Skin is warm and dry. Capillary refill takes less than 2 seconds. No rash noted. He is not diaphoretic. No erythema. No pallor.  Psychiatric: He has a normal mood and affect. His behavior is normal.  Nursing note and vitals reviewed.  ED Treatments / Results  Labs (all labs ordered are listed, but only abnormal results are displayed) Labs Reviewed  RPR  HIV ANTIBODY (ROUTINE TESTING)  GC/CHLAMYDIA PROBE AMP ([place]) NOT AT [facility]    EKG  EKG Interpretation None       Radiology No results found.  Procedures Procedures (including critical care time)  Medications Ordered in ED Medications  cefTRIAXone (ROCEPHIN) injection 250 mg (not administered)  azithromycin (ZITHROMAX) tablet 1,000 mg (not administered)     Initial Impression /  Assessment and Plan / ED Course  I have reviewed the triage vital signs and the nursing notes.  Pertinent labs & imaging results that were available during my care of the patient were reviewed by me and considered in my medical decision making (see chart for details).    This  is a 21 y.o. Male who presents to the ED requesting STD check after his girlfriend was diagnosed with chlamydia. Patient tells me that his girlfriend was diagnosed Chlamydia about 2 days ago. He denies any symptoms of an STD, but he would like to be checked and treated. He tells me he has been having unprotected intercourse. On exam patient is afebrile nontoxic appearing. He has no GU rashes. No penile discharge. He agrees to be checked for HIV, syphilis, gonorrhea and chlamydia. Advised that is pending testing for STDs and encouraged him to follow-up on test results in about 3 days. I discussed safe sex practices with the patient. I encouraged him to follow-up with health department for future STD checks. I advised the patient to follow-up with their primary care provider this week. I advised the patient to return to the emergency department with new or worsening symptoms or new concerns. The patient verbalized understanding and agreement with plan.     Final Clinical Impressions(s) / ED Diagnoses   Final diagnoses:  Exposure to STD  Concern about STD in male without diagnosis    New Prescriptions New Prescriptions   No medications on file     Lorene DyDansie, Braelynne Garinger, PA-C 05/23/17 1025    Lorre NickAllen, Anthony, MD 05/23/17 [phone]

## 2017-05-24 LAB — HIV ANTIBODY (ROUTINE TESTING W REFLEX): HIV Screen 4th Generation wRfx: NONREACTIVE

## 2017-05-24 LAB — RPR: RPR Ser Ql: NONREACTIVE

## 2017-05-29 LAB — GC/CHLAMYDIA PROBE AMP (~~LOC~~) NOT AT ARMC
CHLAMYDIA, DNA PROBE: POSITIVE — AB
Neisseria Gonorrhea: NEGATIVE

## 2017-06-26 ENCOUNTER — Encounter (HOSPITAL_COMMUNITY): Payer: Self-pay

## 2017-06-26 ENCOUNTER — Emergency Department (HOSPITAL_COMMUNITY)
Admission: EM | Admit: 2017-06-26 | Discharge: 2017-06-26 | Disposition: A | Discharge status: Home / Self Care | Payer: BLUE CROSS/BLUE SHIELD | Attending: Emergency Medicine | Admitting: Emergency Medicine

## 2017-06-26 DIAGNOSIS — R569 Unspecified convulsions: Secondary | ICD-10-CM | POA: Insufficient documentation

## 2017-06-26 DIAGNOSIS — Z5321 Procedure and treatment not carried out due to patient leaving prior to being seen by health care provider: Secondary | ICD-10-CM | POA: Diagnosis not present

## 2017-06-26 LAB — CBG MONITORING, ED: Glucose-Capillary: 129 mg/dL — ABNORMAL HIGH (ref 65–99)

## 2017-06-26 MED ORDER — LEVETIRACETAM 750 MG PO TABS: 750.0000 mg | ORAL_TABLET | Freq: Two times a day (BID) | ORAL | 5 refills | Status: DC

## 2017-06-26 NOTE — Addendum Note (Signed)
Addended by: York Spaniel on: 06/26/2017 01:04 PM   Modules accepted: Orders

## 2017-06-26 NOTE — ED Notes (Signed)
Called pt for room. No answer.  

## 2017-06-26 NOTE — Telephone Encounter (Signed)
I called and talked with the mother. The MRI of the brain was unremarkable. The patient has seizures this morning, bit his tongue again. We'll go up on the Keppra taking 750 mg twice daily.  The mother believes that he did not miss a dose, in the past he has had problems with compliance on medication.

## 2017-06-26 NOTE — ED Notes (Signed)
Called pt x 2 for room.

## 2017-06-26 NOTE — ED Triage Notes (Signed)
Per Pt, Pt is coming from home with complaints of seziure that happened while he was asleep. Pt was on the phone with his girlfriend last night at 2200 normal. Pt was found this mornign at 1030 sweating and reports of a seizure. Pt has injury noted to right eye and left tongue.

## 2017-06-26 NOTE — Telephone Encounter (Signed)
Pt mother(on DPR)is calling stating that she never heard about the results of the MRI.  The note from Dr Anne Hahn from 7-22 was gone over with her.  Pt mother is questioning the seizures and is asking for a call back to discuss

## 2017-06-26 NOTE — ED Notes (Signed)
Attempted to Call with no answer

## 2017-08-04 ENCOUNTER — Ambulatory Visit: Payer: BLUE CROSS/BLUE SHIELD | Admitting: Adult Health

## 2017-09-18 ENCOUNTER — Telehealth: Payer: Self-pay | Admitting: Neurology

## 2017-09-18 DIAGNOSIS — Z5181 Encounter for therapeutic drug level monitoring: Secondary | ICD-10-CM

## 2017-09-18 MED ORDER — DIVALPROEX SODIUM 500 MG PO DR TAB: 500.0000 mg | DELAYED_RELEASE_TABLET | Freq: Two times a day (BID) | ORAL | 2 refills | Status: DC

## 2017-09-18 NOTE — Addendum Note (Signed)
Addended by: York SpanielWILLIS, CHARLES K on: 09/18/2017 06:00 PM   Modules accepted: Orders

## 2017-09-18 NOTE — Telephone Encounter (Signed)
Pt mother(on DPR) is asking for a call back to discuss the levETIRAcetam (KEPPRA) 750 MG tablet, she feels it is affecting his anxiety , please call

## 2017-09-18 NOTE — Telephone Encounter (Signed)
I called and talked to the mother.  The patient has had a lot of irritability and anxiety on Keppra, he is not able to tolerate the medication.  We will start Depakote, 500 mg twice daily, they will call in 2 weeks, we will consider checking blood work and initiating a taper off of the Keppra.

## 2017-09-29 MED ORDER — DIVALPROEX SODIUM 500 MG PO DR TAB: 500.0000 mg | DELAYED_RELEASE_TABLET | Freq: Two times a day (BID) | ORAL | 2 refills | Status: DC

## 2017-09-29 NOTE — Addendum Note (Signed)
Addended by: Rocky LinkGANN, Elwanda Moger A on: 09/29/2017 11:46 AM   Modules accepted: Orders

## 2017-09-29 NOTE — Telephone Encounter (Signed)
[facility] 

## 2017-09-29 NOTE — Telephone Encounter (Signed)
[name] mother called she said the [facility] have [facility]. Please remove [facility]

## 2017-10-06 NOTE — Addendum Note (Signed)
Addended by: York SpanielWILLIS, Shakyla Nolley K on: 10/06/2017 09:36 AM   Modules accepted: Orders

## 2017-10-06 NOTE — Telephone Encounter (Signed)
I called the mother.  The patient's been on Depakote 500 mg twice daily for 2 weeks, they were to call me for blood work before we initiated a taper off of Keppra.  Instead, she completely stop the Keppra suddenly 3 days ago, the patient began having seizures yesterday.  They are to get back on the Keppra, they are to come in for blood work on the Depakote, we will make adjustments in dosing of the Depakote and eventually slowly taper off the Keppra.  I will put in orders for blood work on the Depakote.

## 2017-10-06 NOTE — Telephone Encounter (Signed)
Pts mother is requesting a call from Dr. Anne HahnWillis asap. She did not want to go into detail.

## 2017-10-17 ENCOUNTER — Other Ambulatory Visit (INDEPENDENT_AMBULATORY_CARE_PROVIDER_SITE_OTHER): Payer: Self-pay

## 2017-10-17 DIAGNOSIS — Z0289 Encounter for other administrative examinations: Secondary | ICD-10-CM

## 2017-10-17 DIAGNOSIS — Z5181 Encounter for therapeutic drug level monitoring: Secondary | ICD-10-CM

## 2017-10-18 ENCOUNTER — Telehealth: Payer: Self-pay | Admitting: Neurology

## 2017-10-18 LAB — CBC WITH DIFFERENTIAL/PLATELET
BASOS: 0 %
Basophils Absolute: 0 10*3/uL (ref 0.0–0.2)
EOS (ABSOLUTE): 0.1 10*3/uL (ref 0.0–0.4)
Eos: 1 %
HEMOGLOBIN: 14.9 g/dL (ref 13.0–17.7)
Hematocrit: 45.4 % (ref 37.5–51.0)
Immature Grans (Abs): 0 10*3/uL (ref 0.0–0.1)
Immature Granulocytes: 0 %
LYMPHS ABS: 1.4 10*3/uL (ref 0.7–3.1)
LYMPHS: 28 %
MCH: 27.5 pg (ref 26.6–33.0)
MCHC: 32.8 g/dL (ref 31.5–35.7)
MCV: 84 fL (ref 79–97)
MONOCYTES: 7 %
Monocytes Absolute: 0.3 10*3/uL (ref 0.1–0.9)
NEUTROS ABS: 3.2 10*3/uL (ref 1.4–7.0)
Neutrophils: 64 %
Platelets: 251 10*3/uL (ref 150–379)
RBC: 5.41 x10E6/uL (ref 4.14–5.80)
RDW: 14.1 % (ref 12.3–15.4)
WBC: 4.9 10*3/uL (ref 3.4–10.8)

## 2017-10-18 LAB — COMPREHENSIVE METABOLIC PANEL
A/G RATIO: 1.9 (ref 1.2–2.2)
ALBUMIN: 4.6 g/dL (ref 3.5–5.5)
ALT: 20 IU/L (ref 0–44)
AST: 25 IU/L (ref 0–40)
Alkaline Phosphatase: 42 IU/L (ref 39–117)
BILIRUBIN TOTAL: 0.6 mg/dL (ref 0.0–1.2)
BUN / CREAT RATIO: 9 (ref 9–20)
BUN: 9 mg/dL (ref 6–20)
CHLORIDE: 102 mmol/L (ref 96–106)
CO2: 25 mmol/L (ref 20–29)
Calcium: 9.5 mg/dL (ref 8.7–10.2)
Creatinine, Ser: 1.05 mg/dL (ref 0.76–1.27)
GFR calc non Af Amer: 100 mL/min/{1.73_m2} (ref >59)
GFR, EST AFRICAN AMERICAN: 116 mL/min/{1.73_m2} (ref >59)
GLOBULIN, TOTAL: 2.4 g/dL (ref 1.5–4.5)
Glucose: 140 mg/dL — ABNORMAL HIGH (ref 65–99)
POTASSIUM: 4.7 mmol/L (ref 3.5–5.2)
SODIUM: 141 mmol/L (ref 134–144)
Total Protein: 7 g/dL (ref 6.0–8.5)

## 2017-10-18 LAB — VALPROIC ACID LEVEL: Valproic Acid Lvl: 6 ug/mL — ABNORMAL LOW (ref 50–100)

## 2017-10-18 NOTE — Telephone Encounter (Signed)
I called the mother.  The blood work shows that the patient is not taking his Depakote.  She was inquiring about getting disability for this patient, I indicated that he does not qualify for disability until he can demonstrate that his seizures are not controlled while he is on his medications.  The mother is to contact our office once she has ensured that the patient has taken his medications for at least 2 or 3 weeks, we will recheck a Depakote level at that time.  Until that time, he is to remain on both Depakote and Keppra.  He will likely continue to have seizures if he does not take his medications.

## 2017-11-03 ENCOUNTER — Ambulatory Visit: Payer: BLUE CROSS/BLUE SHIELD | Admitting: Nurse Practitioner

## 2017-11-12 ENCOUNTER — Telehealth: Payer: Self-pay | Admitting: Neurology

## 2017-11-12 NOTE — Telephone Encounter (Signed)
Pts mother calling stating pt had a seizure yesterday. Meriam SpragueBeverly would like a call back to discuss what she they should do moving forward.

## 2017-11-12 NOTE — Telephone Encounter (Signed)
Called and spoke with mother. Offered appt today at 12pm, check in 11:30. She declined, stating she will be at work and she wants to come with him to appt.  Made appt for 11/13/17 at 730am, check in 715am at the latest. She verbalized understanding and appreciation for call.

## 2017-11-13 ENCOUNTER — Encounter: Payer: Self-pay | Admitting: Neurology

## 2017-11-13 ENCOUNTER — Ambulatory Visit (INDEPENDENT_AMBULATORY_CARE_PROVIDER_SITE_OTHER): Payer: BLUE CROSS/BLUE SHIELD | Admitting: Neurology

## 2017-11-13 ENCOUNTER — Encounter (INDEPENDENT_AMBULATORY_CARE_PROVIDER_SITE_OTHER): Payer: Self-pay

## 2017-11-13 VITALS — BP 133/77 | HR 76 | Ht 70.0 in | Wt 170.0 lb

## 2017-11-13 DIAGNOSIS — R569 Unspecified convulsions: Secondary | ICD-10-CM | POA: Diagnosis not present

## 2017-11-13 DIAGNOSIS — Z5181 Encounter for therapeutic drug level monitoring: Secondary | ICD-10-CM

## 2017-11-13 MED ORDER — DIVALPROEX SODIUM 500 MG PO DR TAB
DELAYED_RELEASE_TABLET | ORAL | 2 refills | Status: DC
Start: 2017-11-13 — End: 2018-04-28

## 2017-11-13 NOTE — Patient Instructions (Addendum)
   We will go up on the Depakote 500 mg tablet taking one in the morning and 2 at night.   Depakote (valproic acid) is a seizure medication that also has an FDA approval for migraine headache. The most common potential side effects of this medication include weight gain, tremor, or possible stomach upset. This medication can potentially cause liver problems. If confusion is noted on this medication, contact our office immediately.

## 2017-11-13 NOTE — Progress Notes (Signed)
Reason for visit: Seizures  Dylan Moon is an 22 y.o. male  History of present illness:  Dylan Moon is a 22 year old right-handed black male with a history of seizures.  The patient has had another seizure event that occurred 4 days prior to this evaluation.  The patient admits to missing at least 2 days worth of medications.  He has had problems on the Keppra with irritability, he is being switched to Depakote.  He has had problems with appetite, he is not sleeping well.  The patient continues to smoke marijuana on a regular basis.  The patient is not operating a motor vehicle.  He comes in with his mother today.  The mother really has no idea whether the patient is taking his medications or not.  She does not monitor his dosing schedules.  Past Medical History:  Diagnosis Date  . ADHD   . Scoliosis   . Seizures (HCC)     Past Surgical History:  Procedure Laterality Date  . NO PAST SURGERIES      Family History  Problem Relation Age of Onset  . Diabetes Mother   . Hypertension Mother   . Cancer Other   . Diabetes Father   . Seizures Maternal Grandfather     Social history:  reports that  has never smoked. he has never used smokeless tobacco. He reports that he uses drugs. Drug: Marijuana. He reports that he does not drink alcohol.   No Known Allergies  Medications:  Prior to Admission medications   Medication Sig Start Date End Date Taking? Authorizing Provider  divalproex (DEPAKOTE) 500 MG DR tablet One tablet in the morning and 2 in the evening 11/13/17  Yes York Spaniel, MD  levETIRAcetam (KEPPRA) 750 MG tablet Take 1 tablet (750 mg total) by mouth 2 (two) times daily. 06/26/17  Yes York Spaniel, MD    ROS:  Out of a complete 14 system review of symptoms, the patient complains only of the following symptoms, and all other reviewed systems are negative.  Seizures  Blood pressure 133/77, pulse 76, height 5\' 10"  (1.778 m), weight 170 lb (77.1  kg).  Physical Exam  General: The patient is alert and cooperative at the time of the examination.  Skin: No significant peripheral edema is noted.   Neurologic Exam  Mental status: The patient is alert and oriented x 3 at the time of the examination. The patient has apparent normal recent and remote memory, with an apparently normal attention span and concentration ability.   Cranial nerves: Facial symmetry is present. Speech is normal, no aphasia or dysarthria is noted. Extraocular movements are full. Visual fields are full.  Motor: The patient has good strength in all 4 extremities.  Sensory examination: Soft touch sensation is symmetric on the face, arms, and legs.  Coordination: The patient has good finger-nose-finger and heel-to-shin bilaterally.  Gait and station: The patient has a normal gait. Tandem gait is normal. Romberg is negative. No drift is seen.  Reflexes: Deep tendon reflexes are symmetric.   Assessment/Plan:  1.  History of seizures, recent recurrence  2.  Medical noncompliance  The patient will be increased on the Depakote taking 500 mg in the morning and 1000 mg in the evening.  The patient is to get a pill dispenser so he and his mother can monitor his medication intake.  We will get blood work today, we will consider a taper off of the Keppra in the near future.  The mother is upset about the side effects from the Keppra.  The patient will follow-up for his next scheduled visit on 11 December 2017.  We will need to check blood work again at that time.  Dylan Moon. Dylan Dealie Koelzer MD 11/13/2017 8:00 AM  [facility] [address] [place], [place] [id number]  Phone [phone] Fax [phone]

## 2017-11-14 ENCOUNTER — Telehealth: Payer: Self-pay | Admitting: Neurology

## 2017-11-14 LAB — COMPREHENSIVE METABOLIC PANEL
ALBUMIN: 4.6 g/dL (ref 3.5–5.5)
ALK PHOS: 44 IU/L (ref 39–117)
ALT: 13 IU/L (ref 0–44)
AST: 16 IU/L (ref 0–40)
Albumin/Globulin Ratio: 2.2 (ref 1.2–2.2)
BUN / CREAT RATIO: 14 (ref 9–20)
BUN: 14 mg/dL (ref 6–20)
Bilirubin Total: 0.5 mg/dL (ref 0.0–1.2)
CO2: 21 mmol/L (ref 20–29)
Calcium: 9.4 mg/dL (ref 8.7–10.2)
Chloride: 102 mmol/L (ref 96–106)
Creatinine, Ser: 0.97 mg/dL (ref 0.76–1.27)
GFR calc Af Amer: 128 mL/min/{1.73_m2} (ref >59)
GFR, EST NON AFRICAN AMERICAN: 110 mL/min/{1.73_m2} (ref >59)
GLOBULIN, TOTAL: 2.1 g/dL (ref 1.5–4.5)
Glucose: 103 mg/dL — ABNORMAL HIGH (ref 65–99)
POTASSIUM: 4.1 mmol/L (ref 3.5–5.2)
SODIUM: 140 mmol/L (ref 134–144)
Total Protein: 6.7 g/dL (ref 6.0–8.5)

## 2017-11-14 LAB — CBC WITH DIFFERENTIAL/PLATELET
BASOS: 0 %
Basophils Absolute: 0 10*3/uL (ref 0.0–0.2)
EOS (ABSOLUTE): 0.1 10*3/uL (ref 0.0–0.4)
Eos: 2 %
HEMATOCRIT: 46.8 % (ref 37.5–51.0)
Hemoglobin: 15.8 g/dL (ref 13.0–17.7)
IMMATURE GRANULOCYTES: 0 %
Immature Grans (Abs): 0 10*3/uL (ref 0.0–0.1)
LYMPHS ABS: 2.5 10*3/uL (ref 0.7–3.1)
Lymphs: 39 %
MCH: 28.6 pg (ref 26.6–33.0)
MCHC: 33.8 g/dL (ref 31.5–35.7)
MCV: 85 fL (ref 79–97)
Monocytes Absolute: 0.4 10*3/uL (ref 0.1–0.9)
Monocytes: 7 %
NEUTROS PCT: 52 %
Neutrophils Absolute: 3.4 10*3/uL (ref 1.4–7.0)
PLATELETS: 245 10*3/uL (ref 150–379)
RBC: 5.53 x10E6/uL (ref 4.14–5.80)
RDW: 13.9 % (ref 12.3–15.4)
WBC: 6.4 10*3/uL (ref 3.4–10.8)

## 2017-11-14 LAB — VALPROIC ACID LEVEL: Valproic Acid Lvl: 9 ug/mL — ABNORMAL LOW (ref 50–100)

## 2017-11-14 NOTE — Telephone Encounter (Signed)
I called the mother.  The Depakote level once again is low, the patient has not been taking his medications.  He will be going up on the Depakote taking 500 mg in the morning and thousand milligrams in the evening, we will recheck blood work in 3 weeks.  He will go down on the Keppra taking 1 tablet a day for 2 weeks and then stop the drug.

## 2017-11-24 ENCOUNTER — Telehealth: Payer: Self-pay | Admitting: Neurology

## 2017-11-24 NOTE — Telephone Encounter (Signed)
Pt mother would like a call back regarding Depakote not working. Best call back is  [phone]

## 2017-11-24 NOTE — Telephone Encounter (Signed)
I called the mother.  The patient will be tapering off of Keppra, he should be off of the medication by the end of this week.  He will be going on the Depakote 500 mg in the morning 1000 mg in the evening, I will call them in another couple weeks to check blood work.  For some reason, they seem to have problems understanding these directions and staying on the medication.

## 2017-12-02 ENCOUNTER — Telehealth: Payer: Self-pay | Admitting: Neurology

## 2017-12-02 DIAGNOSIS — Z5181 Encounter for therapeutic drug level monitoring: Secondary | ICD-10-CM

## 2017-12-02 NOTE — Telephone Encounter (Signed)
Pt mother(on DPR) has called stating pt just had a seizure, ambulance just left(pt is at home) she was advised to call and ask Dr Anne HahnWillis to call in the antiseptic for pt biting his tongue.  [id number] [place], [place] - [address] [phone] (Phone) [phone] (Fax)   Mother does not know the name of the antiseptic, she said the ambulance stated Dr Anne HahnWillis would know

## 2017-12-02 NOTE — Telephone Encounter (Signed)
I usually do not use an antibiotic for a tongue bite following a seizure.  I would recommend simply using mouthwash to keep the area clean.  It is about time for the patient come in and check blood work, in the past he has had a lot of problems with medical noncompliance.  We will check the levels of Depakote.  From the blood work we will determine whether the dosing can be increased.

## 2017-12-02 NOTE — Addendum Note (Signed)
Addended by: York SpanielWILLIS, CHARLES K on: 12/02/2017 04:47 PM   Modules accepted: Orders

## 2017-12-09 NOTE — Progress Notes (Signed)
[facility]  PATIENT: Dylan Moon DOB: 1996/07/20   REASON FOR VISIT: Follow-up for seizure disorder, medication noncompliance HISTORY FROM: Patient and mom Dylan Moon   HISTORY OF PRESENT ILLNESS:UPDATE 3/21/2019CM Dylan Moon, 22 year old male returns for follow-up with his mom is a history of seizure event in February after missing 2 days worth of medication.  He has had problems with irritability on Keppra.  He was switched to Depakote by Dr. Anne Moon but repeat lab work showed Depakote level at 9 the patient was noncompliant .  Mother called in again on 11/24/2017 saying the Depakote was not working however he was not taking the medication as prescribed.  Should be taking 500 mg in the morning thousand milligrams in the evening.  The Keppra was due to be tapered and discontinued by the first week in March however on return visit today he is still taking an afternoon dose of Keppra.  Patient had another seizure on 12/02/17 where he bit his tongue.  He was advised to use a mouthwash and keep the area clean.  He returns for reevaluation today.  He claims he is compliant with the Depakote.  He was made aware he cannot operate a motor vehicle for 6 months.  Mother still says she does not monitor his medication compliance and was encouraged to do so. 11/13/17 Dylan Moon is a 22 year old right-handed black male with a history of seizures.  The patient has had another seizure event that occurred 4 days prior to this evaluation.  The patient admits to missing at least 2 days worth of medications.  He has had problems on the Keppra with irritability, he is being switched to Depakote.  He has had problems with appetite, he is not sleeping well.  The patient continues to smoke marijuana on a regular basis.  The patient is not operating a motor vehicle.  He comes in with his mother today.  The mother really has no idea whether the patient is taking his medications or not.  She does not monitor  his dosing schedules.   REVIEW OF SYSTEMS: Full 14 system review of systems performed and notable only for those listed, all others are neg:  Constitutional: neg  Cardiovascular: neg Ear/Nose/Throat: neg  Skin: neg Eyes: neg Respiratory: neg Gastroitestinal: neg  Hematology/Lymphatic: neg  Endocrine: neg Musculoskeletal:neg Allergy/Immunology: neg Neurological: Seizure disorder Psychiatric: neg Sleep : neg   ALLERGIES: No Known Allergies  HOME MEDICATIONS: Outpatient Medications Prior to Visit  Medication Sig Dispense Refill  . divalproex (DEPAKOTE) 500 MG DR tablet One tablet in the morning and 2 in the evening 90 tablet 2   No facility-administered medications prior to visit.     PAST MEDICAL HISTORY: Past Medical History:  Diagnosis Date  . ADHD   . Scoliosis   . Seizures (HCC)    most recent 12/02/17    PAST SURGICAL HISTORY: Past Surgical History:  Procedure Laterality Date  . NO PAST SURGERIES      FAMILY HISTORY: Family History  Problem Relation Age of Onset  . Diabetes Mother   . Hypertension Mother   . Cancer Other   . Diabetes Father   . Seizures Maternal Grandfather     SOCIAL HISTORY: Social History   Socioeconomic History  . Marital status: Single    Spouse name: Not on file  . Number of children: 0  . Years of education: HS  . Highest education level: Not on file  Occupational History  . Occupation: Arboriculturist  Social Needs  . Financial resource strain: Not on file  . Food insecurity:    Worry: Not on file    Inability: Not on file  . Transportation needs:    Medical: Not on file    Non-medical: Not on file  Tobacco Use  . Smoking status: Never Smoker  . Smokeless tobacco: Never Used  Substance and Sexual Activity  . Alcohol use: No  . Drug use: Yes    Types: Marijuana    Comment: Daily use of marijuana.  . Sexual activity: Not on file  Lifestyle  . Physical activity:    Days per week: Not on file     Minutes per session: Not on file  . Stress: Not on file  Relationships  . Social connections:    Talks on phone: Not on file    Gets together: Not on file    Attends religious service: Not on file    Active member of club or organization: Not on file    Attends meetings of clubs or organizations: Not on file    Relationship status: Not on file  . Intimate partner violence:    Fear of current or ex partner: Not on file    Emotionally abused: Not on file    Physically abused: Not on file    Forced sexual activity: Not on file  Other Topics Concern  . Not on file  Social History Narrative   Lives at home his mother.   3-4 sodas per week.   Right-handed.     PHYSICAL EXAM  Vitals:   12/11/17 0954  BP: 126/62  Pulse: 69  Weight: 173 lb (78.5 kg)   Body mass index is 24.82 kg/m.  Generalized: Well developed, in no acute distress  Head: normocephalic and atraumatic,. Oropharynx benign  Neck: Supple,   Musculoskeletal: No deformity   Neurological examination   Mentation: Alert oriented to time, place, history taking. Attention span and concentration appropriate. Recent and remote memory intact.  Follows all commands speech and language fluent.   Cranial nerve II-XII: Pupils were equal round reactive to light extraocular movements were full, visual field were full on confrontational test. Facial sensation and strength were normal. hearing was intact to finger rubbing bilaterally. Uvula tongue midline. head turning and shoulder shrug were normal and symmetric.Tongue protrusion into cheek strength was normal. Motor: normal bulk and tone, full strength in the BUE, BLE,  Sensory: normal and symmetric to light touch, on the face arms and legs Coordination: finger-nose-finger, heel-to-shin bilaterally, no dysmetria Reflexes: Brachioradialis 2/2, biceps 2/2, triceps 2/2, patellar 2/2, Achilles 2/2, plantar responses were flexor bilaterally. Gait and Station: Rising up from seated  position without assistance, normal stance,  moderate stride, good arm swing, smooth turning, able to perform tiptoe, and heel walking without difficulty. Tandem gait is steady  DIAGNOSTIC DATA (LABS, IMAGING, TESTING) - I reviewed patient records, labs, notes, testing and imaging myself where available.  Lab Results  Component Value Date   WBC 6.4 11/13/2017   HGB 15.8 11/13/2017   HCT 46.8 11/13/2017   MCV 85 11/13/2017   PLT 245 11/13/2017      Component Value Date/Time   NA 140 11/13/2017 0809   K 4.1 11/13/2017 0809   CL 102 11/13/2017 0809   CO2 21 11/13/2017 0809   GLUCOSE 103 (H) 11/13/2017 0809   GLUCOSE 118 (H) 05/24/2016 0320   BUN 14 11/13/2017 0809   CREATININE 0.97 11/13/2017 0809   CALCIUM 9.4 11/13/2017 0809  PROT 6.7 11/13/2017 0809   ALBUMIN 4.6 11/13/2017 0809   AST 16 11/13/2017 0809   ALT 13 11/13/2017 0809   ALKPHOS 44 11/13/2017 0809   BILITOT 0.5 11/13/2017 0809   GFRNONAA 110 11/13/2017 0809   GFRAA 128 11/13/2017 0809    ASSESSMENT AND PLAN  22 y.o. year old male  has a past medical history of ADHD, Scoliosis, and Seizures (HCC). here to follow-up for seizure disorder with recent seizure on 12/02/2017.  He had another seizure on 3 /4/19due to noncompliance Mother has been advised to monitor his compliance.  Patient and mother apparently had trouble understanding directions because the patient should be off Keppra altogether and he is still taking an afternoon dose.  The Keppra caused irritability in the past   PLAN: Check labs today CBC CMP and Depakote level Continue Depakote at current dose for now, 1 tab in the am 2 tabs 12 hours apart once labs are back may increase dose Stop Keppra Important to be compliant with medication No driving for 6 months from last seizure around 06/04/18  Please remember, common seizure triggers are: Sleep deprivation, dehydration, overheating, stress, hypoglycemia or skipping meals, certain medications or excessive  alcohol use,  If you have a prolonged seizure over 5 minutes or back to back seizures, call or have someone call 911 or take you to the nearest emergency room. You cannot drive a car or operate any other machinery or vehicle within 6 months after a  seizure. Please do not swim alone or Take your medicine for seizure prevention regularly and do not skip doses or stop medication abruptly. Avoid taking Wellbutrin, narcotic pain medications and tramadol, as they can lower seizure threshold.  Nilda RiggsNancy Carolyn Martin, [facility] HospitalGNP, [facility]BC, APRN  [facility] [address], [address] [place], [place] [address] [phone]

## 2017-12-11 ENCOUNTER — Encounter: Payer: Self-pay | Admitting: Nurse Practitioner

## 2017-12-11 ENCOUNTER — Ambulatory Visit: Payer: BLUE CROSS/BLUE SHIELD | Admitting: Nurse Practitioner

## 2017-12-11 VITALS — BP 126/62 | HR 69 | Wt 173.0 lb

## 2017-12-11 DIAGNOSIS — R569 Unspecified convulsions: Secondary | ICD-10-CM | POA: Diagnosis not present

## 2017-12-11 DIAGNOSIS — Z5181 Encounter for therapeutic drug level monitoring: Secondary | ICD-10-CM

## 2017-12-11 DIAGNOSIS — Z9114 Patient's other noncompliance with medication regimen: Secondary | ICD-10-CM | POA: Diagnosis not present

## 2017-12-11 NOTE — Patient Instructions (Signed)
Check labs today CBC CMP and Depakote level Continue Depakote at current dose for now, 1 tab in the am 2 tabs 12 hours apart once labs are back may increase dose Stop Keppra Important to be compliant with medication No driving for 6 months from last seizure Please remember, common seizure triggers are: Sleep deprivation, dehydration, overheating, stress, hypoglycemia or skipping meals, certain medications or excessive alcohol use,  If you have a prolonged seizure over 5 minutes or back to back seizures, call or have someone call 911 or take you to the nearest emergency room. You cannot drive a car or operate any other machinery or vehicle within 6 months after a  seizure. Please do not swim alone or Take your medicine for seizure prevention regularly and do not skip doses or stop medication abruptly. Avoid taking Wellbutrin, narcotic pain medications and tramadol, as they can lower seizure threshold.

## 2017-12-12 ENCOUNTER — Telehealth: Payer: Self-pay | Admitting: *Deleted

## 2017-12-12 LAB — CBC WITH DIFFERENTIAL/PLATELET
BASOS ABS: 0 10*3/uL (ref 0.0–0.2)
Basos: 1 %
EOS (ABSOLUTE): 0.1 10*3/uL (ref 0.0–0.4)
Eos: 2 %
HEMOGLOBIN: 15.3 g/dL (ref 13.0–17.7)
Hematocrit: 45.7 % (ref 37.5–51.0)
IMMATURE GRANULOCYTES: 0 %
Immature Grans (Abs): 0 10*3/uL (ref 0.0–0.1)
LYMPHS: 40 %
Lymphocytes Absolute: 1.9 10*3/uL (ref 0.7–3.1)
MCH: 28.4 pg (ref 26.6–33.0)
MCHC: 33.5 g/dL (ref 31.5–35.7)
MCV: 85 fL (ref 79–97)
MONOCYTES: 8 %
Monocytes Absolute: 0.4 10*3/uL (ref 0.1–0.9)
NEUTROS PCT: 49 %
Neutrophils Absolute: 2.4 10*3/uL (ref 1.4–7.0)
Platelets: 234 10*3/uL (ref 150–379)
RBC: 5.39 x10E6/uL (ref 4.14–5.80)
RDW: 14 % (ref 12.3–15.4)
WBC: 4.8 10*3/uL (ref 3.4–10.8)

## 2017-12-12 LAB — COMPREHENSIVE METABOLIC PANEL
ALT: 12 IU/L (ref 0–44)
AST: 17 IU/L (ref 0–40)
Albumin/Globulin Ratio: 2.2 (ref 1.2–2.2)
Albumin: 4.8 g/dL (ref 3.5–5.5)
Alkaline Phosphatase: 40 IU/L (ref 39–117)
BILIRUBIN TOTAL: 0.4 mg/dL (ref 0.0–1.2)
BUN/Creatinine Ratio: 11 (ref 9–20)
BUN: 11 mg/dL (ref 6–20)
CHLORIDE: 100 mmol/L (ref 96–106)
CO2: 25 mmol/L (ref 20–29)
Calcium: 9.7 mg/dL (ref 8.7–10.2)
Creatinine, Ser: 0.99 mg/dL (ref 0.76–1.27)
GFR calc Af Amer: 124 mL/min/{1.73_m2} (ref >59)
GFR calc non Af Amer: 108 mL/min/{1.73_m2} (ref >59)
GLUCOSE: 77 mg/dL (ref 65–99)
Globulin, Total: 2.2 g/dL (ref 1.5–4.5)
Potassium: 5.1 mmol/L (ref 3.5–5.2)
Sodium: 140 mmol/L (ref 134–144)
Total Protein: 7 g/dL (ref 6.0–8.5)

## 2017-12-12 LAB — VALPROIC ACID LEVEL: VALPROIC ACID LVL: 81 ug/mL (ref 50–100)

## 2017-12-12 NOTE — Telephone Encounter (Signed)
Called and spoke with mother about unremarkable labs, depakote level in therapeutic range. Mother verbalized understanding and appreciation for call.

## 2017-12-12 NOTE — Telephone Encounter (Signed)
-----   Message from York Spanielharles K Willis, MD sent at 12/12/2017  7:34 AM EDT -----  The blood work results are unremarkable, depakote level is therapeutic, no change in dosing. Please call the patient. ----- Message ----- From: Nell RangeInterface, [facility] Lab Results In Sent: 12/12/2017   5:41 AM To: York Spanielharles K Willis, MD

## 2017-12-17 ENCOUNTER — Emergency Department (HOSPITAL_COMMUNITY)
Admission: EM | Admit: 2017-12-17 | Discharge: 2017-12-17 | Disposition: A | Discharge status: Home / Self Care | Payer: BLUE CROSS/BLUE SHIELD | Attending: Emergency Medicine | Admitting: Emergency Medicine

## 2017-12-17 ENCOUNTER — Encounter (HOSPITAL_COMMUNITY): Payer: Self-pay | Admitting: Emergency Medicine

## 2017-12-17 ENCOUNTER — Telehealth: Payer: Self-pay | Admitting: Nurse Practitioner

## 2017-12-17 DIAGNOSIS — R569 Unspecified convulsions: Secondary | ICD-10-CM

## 2017-12-17 DIAGNOSIS — Z79899 Other long term (current) drug therapy: Secondary | ICD-10-CM | POA: Insufficient documentation

## 2017-12-17 DIAGNOSIS — G40909 Epilepsy, unspecified, not intractable, without status epilepticus: Secondary | ICD-10-CM | POA: Diagnosis not present

## 2017-12-17 DIAGNOSIS — R410 Disorientation, unspecified: Secondary | ICD-10-CM

## 2017-12-17 LAB — BASIC METABOLIC PANEL
Anion gap: 11 (ref 5–15)
BUN: 12 mg/dL (ref 6–20)
CALCIUM: 9.3 mg/dL (ref 8.9–10.3)
CO2: 25 mmol/L (ref 22–32)
CREATININE: 0.97 mg/dL (ref 0.61–1.24)
Chloride: 100 mmol/L — ABNORMAL LOW (ref 101–111)
GFR calc Af Amer: >60 mL/min (ref >60)
Glucose, Bld: 115 mg/dL — ABNORMAL HIGH (ref 65–99)
Potassium: 5.1 mmol/L (ref 3.5–5.1)
Sodium: 136 mmol/L (ref 135–145)

## 2017-12-17 LAB — CBC WITH DIFFERENTIAL/PLATELET
BASOS ABS: 0 10*3/uL (ref 0.0–0.1)
Basophils Relative: 0 %
Eosinophils Absolute: 0 10*3/uL (ref 0.0–0.7)
Eosinophils Relative: 0 %
HEMATOCRIT: 44.8 % (ref 39.0–52.0)
HEMOGLOBIN: 14.9 g/dL (ref 13.0–17.0)
LYMPHS PCT: 8 %
Lymphs Abs: 1 10*3/uL (ref 0.7–4.0)
MCH: 28.1 pg (ref 26.0–34.0)
MCHC: 33.3 g/dL (ref 30.0–36.0)
MCV: 84.4 fL (ref 78.0–100.0)
Monocytes Absolute: 0.5 10*3/uL (ref 0.1–1.0)
Monocytes Relative: 5 %
NEUTROS ABS: 10 10*3/uL — AB (ref 1.7–7.7)
NEUTROS PCT: 87 %
Platelets: 223 10*3/uL (ref 150–400)
RBC: 5.31 MIL/uL (ref 4.22–5.81)
RDW: 13.3 % (ref 11.5–15.5)
WBC: 11.6 10*3/uL — AB (ref 4.0–10.5)

## 2017-12-17 LAB — CBG MONITORING, ED: GLUCOSE-CAPILLARY: 126 mg/dL — AB (ref 65–99)

## 2017-12-17 LAB — RAPID URINE DRUG SCREEN, HOSP PERFORMED
Amphetamines: NOT DETECTED
BARBITURATES: NOT DETECTED
Benzodiazepines: NOT DETECTED
Cocaine: NOT DETECTED
Opiates: NOT DETECTED
Tetrahydrocannabinol: POSITIVE — AB

## 2017-12-17 LAB — VALPROIC ACID LEVEL: Valproic Acid Lvl: 78 ug/mL (ref 50.0–100.0)

## 2017-12-17 MED ORDER — ACETAMINOPHEN 325 MG PO TABS
650.0000 mg | ORAL_TABLET | Freq: Once | ORAL | Status: AC
  Administered 2017-12-17: 650 mg via ORAL
  Filled 2017-12-17: qty 2

## 2017-12-17 MED ORDER — LEVETIRACETAM 500 MG PO TABS
1000.0000 mg | ORAL_TABLET | Freq: Once | ORAL | Status: AC
  Administered 2017-12-17: 1000 mg via ORAL
  Filled 2017-12-17: qty 2

## 2017-12-17 MED ORDER — LACOSAMIDE 50 MG PO TABS: ORAL_TABLET | ORAL | 3 refills | Status: DC

## 2017-12-17 NOTE — Telephone Encounter (Signed)
I will call the patient, talk with the mother.  The patient was in the emergency room today.  The Depakote levels were therapeutic.  He had another seizure.  The patient will continue the Depakote, I will add Vimpat to this taking 50 mg twice daily for 2 weeks and then go to 100 mg twice daily.

## 2017-12-17 NOTE — Telephone Encounter (Signed)
[name] mother called back, she has another question, did not want to leave any information. Please call

## 2017-12-17 NOTE — Telephone Encounter (Signed)
I called the mother.  The patient is still having some problems with confusion and lethargy, the seizure occurred around 530 this morning.  She will need to keep an eye on him, if he does not recover by tomorrow, she is to contact me.
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
## 2017-12-17 NOTE — Telephone Encounter (Signed)
Patient's mother calling. He was seen in the ED today for seizures. She would like a call back to discuss. I advised Dylan JonesCarolyn is out of the office today but will send message to her nurse.

## 2017-12-17 NOTE — ED Triage Notes (Signed)
Patient arrived with EMS from home , family witnessed grand mal seizure approx. 1 min. while on bed this morning , history of seizures and recent change in medication to Depakote . Alert and oriented at arrival / respirations unlabored .

## 2017-12-17 NOTE — Discharge Instructions (Addendum)
Your depakote level today is 78.  You should restart keppra and call Dr. Anne HahnWillis to follow up and advise regarding ongoing seizure management.

## 2017-12-17 NOTE — Addendum Note (Signed)
Addended by: York SpanielWILLIS, Arthi Mcdonald K on: 12/17/2017 03:57 PM   Modules accepted: Orders

## 2017-12-18 NOTE — Telephone Encounter (Signed)
Pt mother(on DPR ) has called so that Dr Anne HahnWillis can be made aware that pt has not gotten himself together yet and she is asking to be called

## 2017-12-18 NOTE — Telephone Encounter (Signed)
I called the mother.  The patient is still somewhat confused, not back to baseline but better than he was yesterday.  I will go ahead and get MRI evaluation of the brain again.  If the confusion worsens, the mother to take him to the emergency room, an EEG study will be needed to rule out subclinical seizures.

## 2017-12-18 NOTE — Addendum Note (Signed)
Addended by: York SpanielWILLIS, CHARLES K on: 12/18/2017 09:11 AM   Modules accepted: Orders

## 2017-12-19 ENCOUNTER — Telehealth: Payer: Self-pay | Admitting: Neurology

## 2017-12-19 NOTE — Telephone Encounter (Signed)
[employer] Auth: [id number] (exp. 12/19/17 to 01/17/18) order sent to [facility] they will contact the pt to schedule.

## 2017-12-25 ENCOUNTER — Other Ambulatory Visit: Payer: BLUE CROSS/BLUE SHIELD

## 2017-12-29 ENCOUNTER — Telehealth: Payer: Self-pay | Admitting: Nurse Practitioner

## 2017-12-29 DIAGNOSIS — Z0271 Encounter for disability determination: Secondary | ICD-10-CM

## 2017-12-29 MED ORDER — ALPRAZOLAM 0.5 MG PO TABS
ORAL_TABLET | ORAL | 0 refills | Status: DC
Start: 2017-12-29 — End: 2018-04-08

## 2017-12-29 NOTE — Telephone Encounter (Signed)
Xanax was sent into the pharmacy.

## 2017-12-29 NOTE — Telephone Encounter (Signed)
[name] mother states he is claustrophobic and needs something while having MRI on 4/15. Please sent to [facility] [place]

## 2018-01-02 ENCOUNTER — Other Ambulatory Visit: Payer: BLUE CROSS/BLUE SHIELD

## 2018-01-03 ENCOUNTER — Ambulatory Visit
Admission: RE | Admit: 2018-01-03 | Discharge: 2018-01-03 | Disposition: A | Discharge status: Home / Self Care | Payer: BLUE CROSS/BLUE SHIELD | Source: Ambulatory Visit | Attending: Neurology | Admitting: Neurology

## 2018-01-03 DIAGNOSIS — R410 Disorientation, unspecified: Secondary | ICD-10-CM | POA: Diagnosis not present

## 2018-01-05 ENCOUNTER — Other Ambulatory Visit: Payer: BLUE CROSS/BLUE SHIELD

## 2018-01-05 ENCOUNTER — Telehealth: Payer: Self-pay | Admitting: Neurology

## 2018-01-05 NOTE — Telephone Encounter (Signed)
I called the mother.  The patient had MRI of the brain that was unremarkable.  No change from prior studies.  The patient is still having some troubles with memory, he is not return to his usual baseline.  She is concerned that he is having some irritability on the Vimpat just like he did on Keppra.  She is to continue him on the medication, make sure he does not miss doses.  He may get acclimated to the new medication.  He is to remain on Depakote as well.   MRI brain 01/04/18:  IMPRESSION: This is a borderline normal noncontrasted MRI of the brain.   Brain parenchyma appears normal though there is noted to be a small cavum septum pellucidum and mild enlargement of the third ventricle.  This appears stable compared to the 04/13/2017 MRI.

## 2018-01-06 NOTE — Telephone Encounter (Signed)
Called and discussed medications with mom.  He is to be on Depakote and Vimpat.  He is doing a slow titration of Vimpat at present.  Asked her to give it a little time

## 2018-01-06 NOTE — Telephone Encounter (Signed)
[place] mother [place] has called insisting that NP Eber JonesCarolyn calls her re: medication that son is on.  Pt mother was offered option to have message sent to RN for Dr Anne HahnWillis.  Pt mother is again asking that NP Eber JonesCarolyn calls her.

## 2018-01-08 ENCOUNTER — Encounter: Payer: Self-pay | Admitting: Neurology

## 2018-01-08 NOTE — Telephone Encounter (Signed)
I called the patient, talk with the mother.  The patient has still not fully regained baseline mental status following the last seizure event.  MRI of the brain did not show any acute changes.  He is getting on Vimpat added to the Depakote.  I will dictate a letter indicating that there has been an alteration in his cognitive functioning.

## 2018-01-08 NOTE — Telephone Encounter (Signed)
Placed letter up front for pick up. 

## 2018-01-08 NOTE — Telephone Encounter (Signed)
Pt mother(on DPR) has called stating that by 2pm today she needs the note from Dr Anne HahnWillis for pt. For school. Mother states in the note it needs to state : pt is still not right. Pt had MRI on Saturday of this past week.  Please also mention pt medication is still being regulated and mother is also asking that the dates of seizures be mentioned in the letter as well.  This request for a letter is something pt's mother says Dr Anne HahnWillis agreed to do this for her

## 2018-01-09 MED ORDER — PHENYTOIN SODIUM EXTENDED 100 MG PO CAPS: 300.0000 mg | ORAL_CAPSULE | Freq: Every day | ORAL | 3 refills | Status: DC

## 2018-01-09 NOTE — Telephone Encounter (Signed)
[name] mother called she said he is "acting out" like when he was on the keppra. Bad mood swings. She wants him off of the vimpat. She said he was doing good on the depakote. Please call to advise. She is requesting a call from Dr Anne HahnWillis this time. Please call her at [phone]

## 2018-01-09 NOTE — Addendum Note (Signed)
Addended by: York SpanielWILLIS, CHARLES K on: 01/09/2018 12:51 PM   Modules accepted: Orders

## 2018-01-09 NOTE — Telephone Encounter (Signed)
I called the mother.  The patient is having irritability on the Vimpat, they are to cut back to 50 mg a day for a week and then stop, we will start Dilantin 300 mg at night.  I will need to check levels in the next 3-4 weeks.

## 2018-01-20 NOTE — Telephone Encounter (Signed)
I called the patient, her telephone is not accepting phone calls now, I will call back later.

## 2018-01-20 NOTE — Telephone Encounter (Addendum)
Called mother, on [place] and advised her per Dr Anne Hahn' note the patient was to taper off Vimpat by 01/16/18. He was to start Dilantin at that time. She stated that she did not stop the Vimpat and start Dilantin because "he had his birthday party, and I know he was drinking alcohol. He can't drink if he's taking Dialntin, so I didn't start it." She stated that Dilantin causes the same side effects as Keppra, so she doesn't understand why he was prescribed Vimpat. She then asked how much Dilantin is is supposed to take. This RN reviewed Depakote and Dilantin prescriptions with her. She then stated "He won't take 5 pills every night. He already thinks he's over medicated. "  As the conversation went on, she continued to raise her voice and speak faster. She asked to speak to Eber Jones, NP. This RN stated that since Dr Anne Hahn prescribed the Dilantin, will have him call her. She continued to raise her voice, stated she wants to speak to [place]. She continued to have an angry tone and stated she needs to speak with Eber Jones. This RN advised her this message will be routed to both providers. She said "Okay" then ended the call.

## 2018-01-20 NOTE — Telephone Encounter (Signed)
Patient's mother calling stating patient is having problems taking Vimpat. He stays upset, irritable and very bad mood swings. She has not started patient on Dilantin yet. Please call and discuss. She is requesting a call back from [place] or her nurse.

## 2018-01-21 NOTE — Telephone Encounter (Signed)
I called the mother, left a message.  She is concerned about the number of pills he has to take at night, the Dilantin is a once a day medication, but it can be spread out throughout the day if they desire to do this.  They can take the 3 capsules in the morning or they can take 3 capsules in the evening or take 1 capsule 3 times a day.  There is no reason to delay onset of taking the Dilantin.

## 2018-01-30 ENCOUNTER — Telehealth: Payer: Self-pay | Admitting: Neurology

## 2018-01-30 DIAGNOSIS — Z5181 Encounter for therapeutic drug level monitoring: Secondary | ICD-10-CM

## 2018-01-30 NOTE — Telephone Encounter (Signed)
I called the mother.  The patient is to come in for Dilantin and Depakote levels.  The mother indicates that he is still not back to his cognitive baseline after that severe seizure several weeks ago.  Hopefully this is not a permanent change in his cognitive status.  I will check ammonia levels to make sure that the Depakote is not causing cognitive side effects.

## 2018-01-30 NOTE — Telephone Encounter (Signed)
-----   Message from York Spaniel, MD sent at 01/09/2018 12:51 PM EDT ----- Check Dilantin and Depakote levels

## 2018-02-24 ENCOUNTER — Telehealth: Payer: Self-pay | Admitting: Nurse Practitioner

## 2018-02-24 NOTE — Telephone Encounter (Signed)
Patient's mother requesting a call back. The patient has been hearing sounds inside his head. She does not know how long patient just told her.

## 2018-02-24 NOTE — Telephone Encounter (Signed)
I called the mother.  The patient apparently has had noises inside of his head, some whooshing noises since the seizure.  MRI of the brain was done after the seizure and was unremarkable, no structural abnormality to explain these changes.  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

## 2018-02-24 NOTE — Telephone Encounter (Signed)
I called and initially started speaking with mother, then she got pt on phone.  He stated since his last seizure in 12/2017 he has been hearing sounds in his head, back of head, close to R ear, (sirens, swoosh) these are fleeting, every day (once daily) in the evening.  He has been compliant with his medications depakote  500mg  po am and 1000mg  po pm, and dilantin 200mg  po am and 100mg  po pm.  He has not done the lab work from the 01-30-18 as requested, so they will be in tomorrow (for trough level).  Mother then got on phone and said his memory is still not back to his baseline.  FYI

## 2018-03-14 ENCOUNTER — Emergency Department (HOSPITAL_COMMUNITY)
Admission: EM | Admit: 2018-03-14 | Discharge: 2018-03-14 | Discharge status: Against Medical Advice | Payer: BLUE CROSS/BLUE SHIELD | Attending: Emergency Medicine | Admitting: Emergency Medicine

## 2018-03-14 ENCOUNTER — Encounter (HOSPITAL_COMMUNITY): Payer: Self-pay | Admitting: Emergency Medicine

## 2018-03-14 DIAGNOSIS — Z79899 Other long term (current) drug therapy: Secondary | ICD-10-CM | POA: Insufficient documentation

## 2018-03-14 DIAGNOSIS — R569 Unspecified convulsions: Secondary | ICD-10-CM

## 2018-03-14 DIAGNOSIS — Z5321 Procedure and treatment not carried out due to patient leaving prior to being seen by health care provider: Secondary | ICD-10-CM | POA: Insufficient documentation

## 2018-03-14 LAB — CBC
HCT: 51.9 % (ref 39.0–52.0)
Hemoglobin: 16.5 g/dL (ref 13.0–17.0)
MCH: 27.9 pg (ref 26.0–34.0)
MCHC: 31.8 g/dL (ref 30.0–36.0)
MCV: 87.8 fL (ref 78.0–100.0)
PLATELETS: 234 10*3/uL (ref 150–400)
RBC: 5.91 MIL/uL — ABNORMAL HIGH (ref 4.22–5.81)
RDW: 13.4 % (ref 11.5–15.5)
WBC: 8.9 10*3/uL (ref 4.0–10.5)

## 2018-03-14 LAB — COMPREHENSIVE METABOLIC PANEL
ALK PHOS: 40 U/L (ref 38–126)
ALT: 35 U/L (ref 17–63)
AST: 37 U/L (ref 15–41)
Albumin: 4.6 g/dL (ref 3.5–5.0)
Anion gap: 15 (ref 5–15)
BUN: 13 mg/dL (ref 6–20)
CALCIUM: 9.3 mg/dL (ref 8.9–10.3)
CHLORIDE: 103 mmol/L (ref 101–111)
CO2: 18 mmol/L — ABNORMAL LOW (ref 22–32)
CREATININE: 1.2 mg/dL (ref 0.61–1.24)
GFR calc non Af Amer: >60 mL/min (ref >60)
Glucose, Bld: 149 mg/dL — ABNORMAL HIGH (ref 65–99)
Potassium: 4.5 mmol/L (ref 3.5–5.1)
Sodium: 136 mmol/L (ref 135–145)
Total Bilirubin: 0.4 mg/dL (ref 0.3–1.2)
Total Protein: 7.7 g/dL (ref 6.5–8.1)

## 2018-03-14 LAB — PHENYTOIN LEVEL, TOTAL: Phenytoin Lvl: 5.5 ug/mL — ABNORMAL LOW (ref 10.0–20.0)

## 2018-03-14 LAB — CBG MONITORING, ED: GLUCOSE-CAPILLARY: 124 mg/dL — AB (ref 65–99)

## 2018-03-14 LAB — VALPROIC ACID LEVEL: VALPROIC ACID LVL: 74 ug/mL (ref 50.0–100.0)

## 2018-03-14 MED ORDER — LORAZEPAM 2 MG/ML IJ SOLN: 0.5000 mg | Freq: Once | INTRAMUSCULAR | Status: DC

## 2018-03-14 MED ORDER — ONDANSETRON 8 MG PO TBDP: 8.0000 mg | ORAL_TABLET | Freq: Three times a day (TID) | ORAL | 0 refills | Status: DC | PRN

## 2018-03-14 NOTE — ED Triage Notes (Signed)
Pt arrives via EMS from home with reports of 2 witnessed grand mal seizures, 2 mins apart. Mother reports pt hit the side of his head on the first one and the back of his head on the 2nd seizure. Pt reports head pain and requesting water.

## 2018-03-14 NOTE — ED Provider Notes (Addendum)
[name]   CSN: [id number] Arrival date & time: 03/14/18  [id number]     History   Chief Complaint Chief Complaint  Patient presents with  . Seizures    HPI [place] Moon is a 22 y.o. male.  HPI  22 year old male with a history of seizures who presents today after 2 seizures this morning.  His mother's bedside is giving the majority of the history.  She states at 530 she heard a noise from his bedroom when in the plan of having a grand mal seizure which lasted approximately 1 minute.  He was postictal afterwards.  EMS responded and he was back to baseline and they refused transport.  She heard and having a second seizure today about 8 AM and witnessed a similar event.  She again describes this as a grand mal seizure.  He has been post ictal prior to my evaluation.  She states he is taking Depakote 2 in the morning, Dilantin 1 in the morning and Depakote 1 at night and Dilantin 2 at night.  She denies that he has missed any doses.  He is able to contribute and states that he has not missed any doses in the past week.  Discussed the importance of clarity around this issue when he continues to state that he has been taking his medication as prescribed.  Smokes some marijuana and drink but states that he was not drinking alcohol last night and was in bed between  1030 and 11.  He is complaining of some headache.  His mother is noted a mark on his left neck and it is somewhat tender.  Past Medical History:  Diagnosis Date  . ADHD   . Scoliosis   . Seizures (HCC)    most recent 12/02/17    Patient Active Problem List   Diagnosis Date Noted  . Noncompliance with medication regimen 12/11/2017  . Seizure (HCC) 05/24/2016  . Dehydration, mild 05/24/2016  . AKI (acute kidney injury) (HCC) 05/24/2016  . Marijuana use 05/24/2016    Past Surgical History:  Procedure Laterality Date  . NO PAST SURGERIES          Home Medications     Prior to Admission medications   Medication Sig Start Date End Date Taking? Authorizing Provider  ALPRAZolam Prudy Feeler(XANAX) 0.5 MG tablet Take 2 tablets approximately 45 minutes prior to the MRI study, take a third tablet if needed. 12/29/17   York SpanielWillis, Charles K, MD  divalproex (DEPAKOTE) 500 MG DR tablet One tablet in the morning and 2 in the evening Patient taking differently: Take 500-1,000 mg by mouth See admin instructions. One tablet in the morning and 2 in the evening 11/13/17   York SpanielWillis, Charles K, MD  phenytoin (DILANTIN) 100 MG ER capsule Take 3 capsules (300 mg total) by mouth at bedtime. 01/09/18   York SpanielWillis, Charles K, MD    Family History Family History  Problem Relation Age of Onset  . Diabetes Mother   . Hypertension Mother   . Cancer Other   . Diabetes Father   . Seizures Maternal Grandfather     Social History Social History   Tobacco Use  . Smoking status: Never Smoker  . Smokeless tobacco: Never Used  Substance Use Topics  . Alcohol use: No  . Drug use: Yes    Types: Marijuana    Comment: Daily use of marijuana.     Allergies   Keppra [levetiracetam]   Review of Systems Review  of Systems  Unable to perform ROS: Mental status change  All other systems reviewed and are negative.    Physical Exam Updated Vital Signs BP 98/87 (BP Location: Right Arm)   Pulse (!) 102   Temp 98 F (36.7 C) (Oral)   Resp (!) 24   Ht 1.778 m (5\' 10" )   Wt 74.8 kg (165 lb)   SpO2 99%   BMI 23.68 kg/m   Physical Exam  Constitutional: He is oriented to person, place, and time. He appears well-developed and well-nourished.  HENT:  Head: Normocephalic and atraumatic.  Right Ear: External ear normal.  Left Ear: External ear normal.  Mouth/Throat: Oropharynx is clear and moist.  Patient and contusion left side of tongue  Eyes: Pupils are equal, round, and reactive to light. EOM are normal.  Neck: Normal range of motion. Neck supple.  Cardiovascular: Normal rate and regular  rhythm.  Pulmonary/Chest: Effort normal.  Abdominal: Soft.  Musculoskeletal: Normal range of motion.  Neurological: He is alert and oriented to person, place, and time. He displays normal reflexes. No cranial nerve deficit. He exhibits normal muscle tone. Coordination normal.  Skin: Skin is warm and dry. Capillary refill takes less than 2 seconds.  Psychiatric: His speech is normal. Thought content normal. His affect is angry. He is agitated. He expresses impulsivity. He exhibits abnormal recent memory. He exhibits normal remote memory.  Nursing note and vitals reviewed.    ED Treatments / Results  Labs (all labs ordered are listed, but only abnormal results are displayed) Labs Reviewed  COMPREHENSIVE METABOLIC PANEL - Abnormal; Notable for the following components:      Result Value   CO2 18 (*)    Glucose, Bld 149 (*)    All other components within normal limits  CBC - Abnormal; Notable for the following components:   RBC 5.91 (*)    All other components within normal limits  CBG MONITORING, ED - Abnormal; Notable for the following components:   Glucose-Capillary 124 (*)    All other components within normal limits  VALPROIC ACID LEVEL  PHENYTOIN LEVEL, TOTAL    EKG None  Radiology No results found.  Procedures Procedures (including critical care time)  Medications Ordered in ED Medications  LORazepam (ATIVAN) injection 0.5 mg (has no administration in time range)     Initial Impression / Assessment and Plan / ED Course  I have reviewed the triage vital signs and the nursing notes.  Pertinent labs & imaging results that were available during my care of the patient were reviewed by me and considered in my medical decision making (see chart for details).    9:27 AM He is currently awake and alert and is ambulatory.  He is insistent upon leaving.  Nursing and I have both had discussions with him and attempted to persuade him to stay.  He is adamant about leaving.   Given that he is awake alert appears to have capacity would require physical restraints to stay.  His mother is present here with him.Patient gives permission for mother to obtain information regarding this visit  We have discussed making sure that he is taking his medication.  She will take him home and return for further problems.  His Dilantin and Depakote level are pending.    9:43 AM  Valproic acid therapeutic 74 Dilantin low at 5.5- plan advise extra dose today and tomorrow with recheck on Monday-advised extra 200 mg now and increase dose by 100 mg tomorrow Discussed with results  with mother advised of above.  Requests prescription for Zofran and prescription is written. Final Clinical Impressions(s) / ED Diagnoses   Final diagnoses:  Seizure [facility])    ED Discharge Orders    None       Margarita Grizzle, MD 03/14/18 1610    Margarita Grizzle, MD 03/14/18 [phone]

## 2018-03-14 NOTE — ED Notes (Addendum)
Pt Alert and oriented x4 and ambulating with steady gait ; pt wanting to leave and refusing to stay ; pt stating " im about to take this IV out " Dr. Rosalia Hammersay aware and attempted to talk to pt ; pt refusing to stay and has left out of the department at this time ; Per Dr. Rosalia Hammersay pt AMA ; pt refusing to all allow to get repeat vital signs

## 2018-03-17 ENCOUNTER — Telehealth: Payer: Self-pay | Admitting: Neurology

## 2018-03-17 NOTE — Telephone Encounter (Signed)
Late Entry:  Patient mother called on-call physician, he had recurrent seizure on March 14, 2018, was taken to emergency room, Dilantin level was subtherapeutic, advised to increase his Dilantin to 500 mg daily, he was taking 300 mg daily, and Depakote DR 500 mg 1 in the morning, 2 at night,  I have advised him to take Depakote DR 500 mg 2 tablets twice a day, keep Dilantin 300 mg daily  Please call and check on patient, he has a follow-up appointment with Eber Jonesarolyn in September 2019,

## 2018-03-17 NOTE — Telephone Encounter (Signed)
Spoke to mother of pt.  He is doing fine, no seizures.  Went over the medications again to confirm doses.  depakote 1000mg  po BID and dilatin 300mg  po qhs.  She asked about changing MD.  I relayed that she would write letter, and each MD would make decision if ok, then could change. She verbalized understanding.

## 2018-03-17 NOTE — Telephone Encounter (Signed)
LMVM for pt to return call, calling for update on medication increase.

## 2018-04-01 ENCOUNTER — Telehealth: Payer: Self-pay | Admitting: Neurology

## 2018-04-01 NOTE — Telephone Encounter (Signed)
Noted. I will let the pt know.

## 2018-04-01 NOTE — Telephone Encounter (Signed)
Reviewed the chart, patient had recurrent seizures, noncompliance with medications, multiple phone conversations since his most recent visit with Dr. Anne HahnWillis in February 2019,  I do not think I can do a better job than Dr. Anne HahnWillis, next visit is with Eber Jonesarolyn in September 2019, make sure he continue his follow-up visit,

## 2018-04-01 NOTE — Telephone Encounter (Signed)
We received a referral from [facility] on this pt for his seizure disorder. Pt is already followed by Dr. Anne HahnWillis, but he is requesting to switch to Dr. Terrace ArabiaYan. Would you both be ok with this?

## 2018-04-04 ENCOUNTER — Encounter

## 2018-04-08 ENCOUNTER — Telehealth: Payer: Self-pay | Admitting: Neurology

## 2018-04-08 ENCOUNTER — Emergency Department (HOSPITAL_COMMUNITY)
Admission: EM | Admit: 2018-04-08 | Discharge: 2018-04-08 | Disposition: A | Discharge status: Home / Self Care | Payer: BLUE CROSS/BLUE SHIELD | Attending: Emergency Medicine | Admitting: Emergency Medicine

## 2018-04-08 ENCOUNTER — Encounter (HOSPITAL_COMMUNITY): Payer: Self-pay | Admitting: Emergency Medicine

## 2018-04-08 ENCOUNTER — Other Ambulatory Visit: Payer: Self-pay

## 2018-04-08 DIAGNOSIS — Z79899 Other long term (current) drug therapy: Secondary | ICD-10-CM | POA: Insufficient documentation

## 2018-04-08 DIAGNOSIS — R462 Strange and inexplicable behavior: Secondary | ICD-10-CM

## 2018-04-08 DIAGNOSIS — Z046 Encounter for general psychiatric examination, requested by authority: Secondary | ICD-10-CM | POA: Insufficient documentation

## 2018-04-08 DIAGNOSIS — F4325 Adjustment disorder with mixed disturbance of emotions and conduct: Secondary | ICD-10-CM | POA: Diagnosis present

## 2018-04-08 LAB — CBC WITH DIFFERENTIAL/PLATELET
BASOS ABS: 0 10*3/uL (ref 0.0–0.1)
Basophils Relative: 0 %
Eosinophils Absolute: 0.1 10*3/uL (ref 0.0–0.7)
Eosinophils Relative: 1 %
HEMATOCRIT: 44.8 % (ref 39.0–52.0)
HEMOGLOBIN: 15.2 g/dL (ref 13.0–17.0)
LYMPHS PCT: 45 %
Lymphs Abs: 2.5 10*3/uL (ref 0.7–4.0)
MCH: 29.3 pg (ref 26.0–34.0)
MCHC: 33.9 g/dL (ref 30.0–36.0)
MCV: 86.5 fL (ref 78.0–100.0)
MONO ABS: 0.4 10*3/uL (ref 0.1–1.0)
Monocytes Relative: 7 %
NEUTROS ABS: 2.5 10*3/uL (ref 1.7–7.7)
Neutrophils Relative %: 47 %
Platelets: 210 10*3/uL (ref 150–400)
RBC: 5.18 MIL/uL (ref 4.22–5.81)
RDW: 13.2 % (ref 11.5–15.5)
WBC: 5.5 10*3/uL (ref 4.0–10.5)

## 2018-04-08 LAB — COMPREHENSIVE METABOLIC PANEL
ALBUMIN: 4.9 g/dL (ref 3.5–5.0)
ALK PHOS: 45 U/L (ref 38–126)
ALT: 25 U/L (ref 0–44)
ANION GAP: 12 (ref 5–15)
AST: 23 U/L (ref 15–41)
BILIRUBIN TOTAL: 0.4 mg/dL (ref 0.3–1.2)
BUN: 15 mg/dL (ref 6–20)
CALCIUM: 9.7 mg/dL (ref 8.9–10.3)
CO2: 27 mmol/L (ref 22–32)
Chloride: 105 mmol/L (ref 98–111)
Creatinine, Ser: 1.18 mg/dL (ref 0.61–1.24)
GFR calc Af Amer: >60 mL/min (ref >60)
GFR calc non Af Amer: >60 mL/min (ref >60)
GLUCOSE: 131 mg/dL — AB (ref 70–99)
Potassium: 3.8 mmol/L (ref 3.5–5.1)
Sodium: 144 mmol/L (ref 135–145)
TOTAL PROTEIN: 8.1 g/dL (ref 6.5–8.1)

## 2018-04-08 LAB — VALPROIC ACID LEVEL: Valproic Acid Lvl: 39 ug/mL — ABNORMAL LOW (ref 50.0–100.0)

## 2018-04-08 LAB — RAPID URINE DRUG SCREEN, HOSP PERFORMED
Amphetamines: NOT DETECTED
Benzodiazepines: NOT DETECTED
COCAINE: NOT DETECTED
Opiates: NOT DETECTED
Tetrahydrocannabinol: POSITIVE — AB

## 2018-04-08 LAB — ETHANOL: Alcohol, Ethyl (B): <10 mg/dL (ref <10)

## 2018-04-08 MED ORDER — DIVALPROEX SODIUM 500 MG PO DR TAB: 1000.0000 mg | DELAYED_RELEASE_TABLET | Freq: Every day | ORAL | Status: DC

## 2018-04-08 MED ORDER — DIVALPROEX SODIUM 500 MG PO DR TAB
500.0000 mg | DELAYED_RELEASE_TABLET | Freq: Every morning | ORAL | Status: DC
  Administered 2018-04-08: 500 mg via ORAL
  Filled 2018-04-08: qty 1

## 2018-04-08 MED ORDER — PHENYTOIN SODIUM EXTENDED 100 MG PO CAPS: 300.0000 mg | ORAL_CAPSULE | Freq: Every day | ORAL | Status: DC

## 2018-04-08 MED ORDER — DIVALPROEX SODIUM 500 MG PO DR TAB: 1000.0000 mg | DELAYED_RELEASE_TABLET | ORAL | Status: DC

## 2018-04-08 MED ORDER — DIVALPROEX SODIUM 500 MG PO DR TAB: 500.0000 mg | DELAYED_RELEASE_TABLET | ORAL | Status: DC

## 2018-04-08 NOTE — BH Assessment (Signed)
[facility]    04/08/18  Per Nanine MeansJamison Lord, NP and Dr. Sharma CovertNorman, patient does not meet inpatient criteria and can be discharged.

## 2018-04-08 NOTE — Consult Note (Addendum)
[place] Consult   Reason for Consult:  Bizarre behaviors Referring Physician:  EDP Patient Identification: Dylan Moon MRN:  [id number] Principal Diagnosis: Adjustment disorder with mixed disturbance of emotions and conduct Diagnosis:   Patient Active Problem List   Diagnosis Date Noted  . Adjustment disorder with mixed disturbance of emotions and conduct [F43.25] 04/08/2018    Priority: High  . Noncompliance with medication regimen [Z91.14] 12/11/2017  . Seizure ([place]) [R56.9] 05/24/2016  . Dehydration, mild [E86.0] 05/24/2016  . AKI (acute kidney injury) ([place]) [N17.9] 05/24/2016  . Marijuana use [F12.90] 05/24/2016    Total Time spent with patient: 45 minutes  Subjective:   Dylan Moon is a 22 y.o. male patient does not warrant admission.  HPI:  22 yo male who presented to the ED under IVC after taking his son.  He saw him for the first time yesterday and wanted to spend time by himself with his son so he took him.  When the police came, his mother reported him acting bizarre.  Calm and cooperative in the ED with no suicidal/homicidal ideations, hallucinations, or withdrawal symptoms.  He is appropriate in behavior.  Stable for discharge.  Past Psychiatric History: ADHD  Risk to Self:  none Risk to Others:  none Prior Inpatient Therapy:  none Prior Outpatient Therapy:  none  Past Medical History:  Past Medical History:  Diagnosis Date  . ADHD   . Scoliosis   . Seizures ([place])    most recent 12/02/17    Past Surgical History:  Procedure Laterality Date  . NO PAST SURGERIES     Family History:  Family History  Problem Relation Age of Onset  . Diabetes Mother   . Hypertension Mother   . Cancer Other   . Diabetes Father   . Seizures Maternal Grandfather    Family Psychiatric  History: none Social History:  Social History   Substance and Sexual Activity  Alcohol Use No     Social History   Substance and Sexual Activity  Drug Use Yes   . Types: Marijuana   Comment: Daily use of marijuana.    Social History   Socioeconomic History  . Marital status: Single    Spouse name: Not on file  . Number of children: 0  . Years of education: HS  . Highest education level: Not on file  Occupational History  . Occupation: Horticulturist, commercial  Social Needs  . Financial resource strain: Not on file  . Food insecurity:    Worry: Not on file    Inability: Not on file  . Transportation needs:    Medical: Not on file    Non-medical: Not on file  Tobacco Use  . Smoking status: Never Smoker  . Smokeless tobacco: Never Used  Substance and Sexual Activity  . Alcohol use: No  . Drug use: Yes    Types: Marijuana    Comment: Daily use of marijuana.  . Sexual activity: Not on file  Lifestyle  . Physical activity:    Days per week: Not on file    Minutes per session: Not on file  . Stress: Not on file  Relationships  . Social connections:    Talks on phone: Not on file    Gets together: Not on file    Attends religious service: Not on file    Active member of club or organization: Not on file    Attends meetings of clubs or organizations: Not on file  Relationship status: Not on file  Other Topics Concern  . Not on file  Social History Narrative   Lives at home his mother.   3-4 sodas per week.   Right-handed.   Additional Social History: N/A    Allergies:   Allergies  Allergen Reactions  . Keppra [Levetiracetam] Other (See Comments)    irritability    Labs:  Results for orders placed or performed during the hospital encounter of 04/08/18 (from the past 48 hour(s))  Comprehensive metabolic panel     Status: Abnormal   Collection Time: 04/08/18 12:34 AM  Result Value Ref Range   Sodium 144 135 - 145 mmol/L   Potassium 3.8 3.5 - 5.1 mmol/L   Chloride 105 98 - 111 mmol/L    Comment: Please note change in reference range.   CO2 27 22 - 32 mmol/L   Glucose, Bld 131 (H) 70 - 99 mg/dL    Comment:  Please note change in reference range.   BUN 15 6 - 20 mg/dL    Comment: Please note change in reference range.   Creatinine, Ser 1.18 0.61 - 1.24 mg/dL   Calcium 9.7 8.9 - 10.3 mg/dL   Total Protein 8.1 6.5 - 8.1 g/dL   Albumin 4.9 3.5 - 5.0 g/dL   AST 23 15 - 41 U/L   ALT 25 0 - 44 U/L    Comment: Please note change in reference range.   Alkaline Phosphatase 45 38 - 126 U/L   Total Bilirubin 0.4 0.3 - 1.2 mg/dL   GFR calc non Af Amer >60 >60 mL/min   GFR calc Af Amer >60 >60 mL/min    Comment: (NOTE) The eGFR has been calculated using the CKD EPI equation. This calculation has not been validated in all clinical situations. eGFR's persistently <60 mL/min signify possible Chronic Kidney Disease.    Anion gap 12 5 - 15    Comment: Performed at [facility], [place] [address], [place], [place] [address]  Ethanol     Status: None   Collection Time: 04/08/18 12:34 AM  Result Value Ref Range   Alcohol, Ethyl (B) <10 <10 mg/dL    Comment: (NOTE) Lowest detectable limit for serum alcohol is 10 mg/dL. For medical purposes only. Performed at [facility], [place] [address] [place], [place] [address]   CBC with Diff     Status: None   Collection Time: 04/08/18 12:34 AM  Result Value Ref Range   WBC 5.5 4.0 - 10.5 K/uL   RBC 5.18 4.22 - 5.81 MIL/uL   Hemoglobin 15.2 13.0 - 17.0 g/dL   HCT 44.8 39.0 - 52.0 %   MCV 86.5 78.0 - 100.0 fL   MCH 29.3 26.0 - 34.0 pg   MCHC 33.9 30.0 - 36.0 g/dL   RDW 13.2 11.5 - 15.5 %   Platelets 210 150 - 400 K/uL   Neutrophils Relative % 47 %   Neutro Abs 2.5 1.7 - 7.7 K/uL   Lymphocytes Relative 45 %   Lymphs Abs 2.5 0.7 - 4.0 K/uL   Monocytes Relative 7 %   Monocytes Absolute 0.4 0.1 - 1.0 K/uL   Eosinophils Relative 1 %   Eosinophils Absolute 0.1 0.0 - 0.7 K/uL   Basophils Relative 0 %   Basophils Absolute 0.0 0.0 - 0.1 K/uL    Comment: Performed at [facility], [place] [address],  [place], [place] [address]  Valproic acid level     Status:  Abnormal   Collection Time: 04/08/18 12:34 AM  Result Value Ref Range   Valproic Acid Lvl 39 (L) 50.0 - 100.0 ug/mL    Comment: Performed at [facility] [place] [address], [place], [place] [address]  Urine rapid drug screen (hosp performed)     Status: Abnormal   Collection Time: 04/08/18  7:31 AM  Result Value Ref Range   Opiates NONE DETECTED NONE DETECTED   Cocaine NONE DETECTED NONE DETECTED   Benzodiazepines NONE DETECTED NONE DETECTED   Amphetamines NONE DETECTED NONE DETECTED   Tetrahydrocannabinol POSITIVE (A) NONE DETECTED   Barbiturates (A) NONE DETECTED    Result not available. Reagent lot number recalled by manufacturer.    Comment: Performed at [facility], [place] [address], [place], [place] [address]    Current Facility-Administered Medications  Medication Dose Route Frequency Provider Last Rate Last Dose  . divalproex (DEPAKOTE) DR tablet 1,000 mg  1,000 mg Oral QHS Cardama, Grayce Sessions, MD      . divalproex (DEPAKOTE) DR tablet 500 mg  500 mg Oral q morning - 10a Cardama, Grayce Sessions, MD   500 mg at 04/08/18 0912  . phenytoin (DILANTIN) ER capsule 300 mg  300 mg Oral QHS Cardama, Grayce Sessions, MD       Current Outpatient Medications  Medication Sig Dispense Refill  . divalproex (DEPAKOTE) 500 MG DR tablet One tablet in the morning and 2 in the evening (Patient taking differently: Take 500-1,000 mg by mouth See admin instructions. One tablet in the morning and 2 in the evening) 90 tablet 2  . ondansetron (ZOFRAN ODT) 8 MG disintegrating tablet Take 1 tablet (8 mg total) by mouth every 8 (eight) hours as needed for nausea or vomiting. 20 tablet 0  . phenytoin (DILANTIN) 100 MG ER capsule Take 3 capsules (300 mg total) by mouth at bedtime. 90 capsule 3    Musculoskeletal: Strength & Muscle Tone: within normal limits Gait & Station: normal Patient leans: N/A  Psychiatric  Specialty Exam: Physical Exam  Nursing note and vitals reviewed. Constitutional: He is oriented to person, place, and time. He appears well-developed and well-nourished.  HENT:  Head: Normocephalic.  Neck: Normal range of motion.  Respiratory: Effort normal.  Musculoskeletal: Normal range of motion.  Neurological: He is alert and oriented to person, place, and time.  Psychiatric: He has a normal mood and affect. His speech is normal and behavior is normal. Thought content normal. Cognition and memory are normal. He expresses impulsivity.    Review of Systems  Psychiatric/Behavioral: Positive for substance abuse.  All other systems reviewed and are negative.   Blood pressure (!) 162/80, pulse 98, temperature 98.7 F (37.1 C), temperature source Oral, resp. rate (!) 22, height '5\' 10"'  (1.778 m), weight 74.8 kg (165 lb), SpO2 99 %.Body mass index is 23.68 kg/m.  General Appearance: Casual  Eye Contact:  Good  Speech:  Normal Rate  Volume:  Normal  Mood:  Euthymic  Affect:  Congruent  Thought Process:  Coherent and Descriptions of Associations: Intact  Orientation:  Full (Time, Place, and Person)  Thought Content:  WDL and Logical  Suicidal Thoughts:  No  Homicidal Thoughts:  No  Memory:  Immediate;   Good Recent;   Good Remote;   Good  Judgement:  Fair  Insight:  Fair  Psychomotor Activity:  Normal  Concentration:  Concentration: Good and Attention Span: Good  Recall:  Good  Fund of Knowledge:  Good  Language:  Good  Akathisia:  No  Handed:  Right  AIMS (if indicated):   N/A  Assets:  Housing Leisure Time Physical Health Resilience Social Support  ADL's:  Intact  Cognition:  WNL  Sleep:   N/A     Treatment Plan Summary: Adjustment disorder with mixed disturbance of emotions and conduct: None needed  Disposition: No evidence of imminent risk to self or others at present.    Waylan Boga, NP 04/08/2018 10:29 AM   Patient seen face-to-face for psychiatric  evaluation, chart reviewed and case discussed with the physician extender and developed treatment plan. Reviewed the information documented and agree with the treatment plan.  Buford Dresser, DO 04/08/18 1:51 PM

## 2018-04-08 NOTE — ED Notes (Signed)
Pt transferred to acute unit. Pt cooperative at this time. Pt was sleeping prior to transfer and immediately laid down in bed in new room. Pt denies needs or concerns. No distress is noted at this time.
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
## 2018-04-08 NOTE — ED Triage Notes (Signed)
Pt here with [facility] following erratic behavior. Pt was had his 616 month old son and was acting bizarre. Pt then left with the child. [name] family were worried about his safety and the safety of his child so they called [facility]. Pt eventually showed up at his sister's house with the baby. Per [facility] they were told the pt had seizures today although they are unclear on the details. They are unsure when this happened. Pt is labile at this time. Initially pt was not complying with staff requests but is now allowing lab draw. Pt IVC'ed by mother

## 2018-04-08 NOTE — [facility] Suicide Risk Assessment (Signed)
Suicide Risk Assessment  Discharge Assessment   [facility]   Principal Problem: Adjustment disorder with mixed disturbance of emotions and conduct Discharge Diagnoses:  Patient Active Problem List   Diagnosis Date Noted  . Adjustment disorder with mixed disturbance of emotions and conduct [F43.25] 04/08/2018    Priority: High  . Noncompliance with medication regimen [Z91.14] 12/11/2017  . Seizure (HCC) [R56.9] 05/24/2016  . Dehydration, mild [E86.0] 05/24/2016  . AKI (acute kidney injury) (HCC) [N17.9] 05/24/2016  . Marijuana use [F12.90] 05/24/2016    Total Time spent with patient: 45 minutes  Musculoskeletal: Strength & Muscle Tone: within normal limits Gait & Station: normal Patient leans: N/A  Psychiatric Specialty Exam: Physical Exam  Nursing note and vitals reviewed. Constitutional: He is oriented to person, place, and time. He appears well-developed and well-nourished.  HENT:  Head: Normocephalic.  Neck: Normal range of motion.  Respiratory: Effort normal.  Musculoskeletal: Normal range of motion.  Neurological: He is alert and oriented to person, place, and time.  Psychiatric: He has a normal mood and affect. His speech is normal and behavior is normal. Thought content normal. Cognition and memory are normal. He expresses impulsivity.    Review of Systems  Psychiatric/Behavioral: Positive for substance abuse.  All other systems reviewed and are negative.   Blood pressure (!) 162/80, pulse 98, temperature 98.7 F (37.1 C), temperature source Oral, resp. rate (!) 22, height 5\' 10"  (1.778 m), weight 74.8 kg (165 lb), SpO2 99 %.Body mass index is 23.68 kg/m.  General Appearance: Casual  Eye Contact:  Good  Speech:  Normal Rate  Volume:  Normal  Mood:  Euthymic  Affect:  Congruent  Thought Process:  Coherent and Descriptions of Associations: Intact  Orientation:  Full (Time, Place, and Person)  Thought Content:  WDL and Logical   Suicidal Thoughts:  No  Homicidal Thoughts:  No  Memory:  Immediate;   Good Recent;   Good Remote;   Good  Judgement:  Fair  Insight:  Fair  Psychomotor Activity:  Normal  Concentration:  Concentration: Good and Attention Span: Good  Recall:  Good  Fund of Knowledge:  Good  Language:  Good  Akathisia:  No  Handed:  Right  AIMS (if indicated):     Assets:  Housing Leisure Time Physical Health Resilience Social Support  ADL's:  Intact  Cognition:  WNL  Sleep:      Mental Status Per Nursing Assessment::   On Admission:   bizarre behaviors  Demographic Factors:  Male and Adolescent or young adult  Loss Factors: NA  Historical Factors: None  Risk Reduction Factors:   Sense of responsibility to family, Living with another person, especially a relative and Positive social support  Continued Clinical Symptoms:  None   Cognitive Features That Contribute To Risk:  None    Suicide Risk:  Minimal: No identifiable suicidal ideation.  Patients presenting with no risk factors but with morbid ruminations; may be classified as minimal risk based on the severity of the depressive symptoms    Plan Of Care/Follow-up recommendations:  Activity:  as tolerated Diet:  heart healthy diet  Dee Paden, NP 04/08/2018, 10:10 AM

## 2018-04-08 NOTE — BH Assessment (Signed)
Attempted to complete TTS assessment with pt.  The pt stated he wanted to spend time with his son.  He denied having a seizure today  The pt walked out of the room and stated he did not want to answer any questions.  TTS walked with the pt back to his room.  The pt stated he is not going to answer any questions.  Will attempt to do assessment when the pt is cooperative.

## 2018-04-08 NOTE — ED Notes (Signed)
Pt d/c home per MD order. Discharge summary reviewed with pt. Pt verbalizes understanding. Pt denies SI/HI/AVH. Personal property returned. Pt signed e-signature. Ambulatory off unit with [facility].

## 2018-04-08 NOTE — Telephone Encounter (Signed)
Mrs. Dylan Moon who identified herself as Dylan Moon mother, called me yesterday shortly after 5 PM, when I returned the call nobody picked up.  I received a second call at around 9 PM after I have met made 2 more attempts to contact the patient.  Mother reported that Dylan Moon has become agitated, verbally aggressive and she reports what sounds like visual and auditory hallucinations.  She was visibly audibly upset and frustrated and stated that her son's condition had become worse after doctors have placed him on Keppra, while he did actually better when treated with valproic acid.  She states he has ongoing seizures.  He did not have seizures the day of her call.  I explained to her that Dilantin and valproic acid can be used to sedate and mellow a patient that has a psychotic symptoms.  I also strongly recommended that the patient sees a psychiatrist or behavioral health specialist after she explained to me that MRI studies and other neurologic evaluations have returned negative and could not explain her son's behavior nor spells. I then learned that the patient has a psychiatry appointment on Friday, I asked her to continue giving her some the Depakote and Dilantin as currently ordered and to make a follow-up appointment after psychiatry has evaluated him with Dr. Jannifer Franklin.

## 2018-04-08 NOTE — ED Notes (Signed)
Bed: WA27 Expected date:  Expected time:  Means of arrival:  Comments: 

## 2018-04-09 NOTE — Telephone Encounter (Signed)
noted 

## 2018-04-12 NOTE — Telephone Encounter (Signed)
Most recent Depakote level on 17 July indicates a low level indicating that he has missed doses of the Depakote.  The patient apparently has some difficulty with medical compliance.   I called the mother, left a message.  You are to contact me whenever he is available for further blood work, we need to recheck the Dilantin and Depakote levels, the Dilantin level was low on 14 March 2018, reviewed by Darrol Angelarolyn Martin, it does not appear that any adjustments were made to the dose.

## 2018-04-15 NOTE — Telephone Encounter (Signed)
Pts mother Meriam SpragueBeverly called requesting a call from the RN. Stating the pt will need a letter for school touching base on the pts "memory not in line", also stating the s/e from Keppra and all medications pt currently on. Please call to advise

## 2018-04-16 NOTE — Telephone Encounter (Signed)
Received call back from mother asking again for the date her son was last in hospital. This RN advised it was in [facility] on 04/08/18. She asked what time, and this RN advised her it was right around midnight. She verbalized understanding, appreciation.

## 2018-04-16 NOTE — Telephone Encounter (Signed)
Please let mother that we will only speak to his last visit in the office which was 12/11/2017.  He was off Keppra at that time due to irritability.  He was on Depakote but did not complain with side effects at that time.
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
## 2018-04-20 NOTE — Telephone Encounter (Signed)
LVM advising mother this RN is trying to reach her with NP's message. Left number.

## 2018-04-21 NOTE — Telephone Encounter (Signed)
Spoke with mother and advised her of NP's message. Advised her the NP will only be able to address her observations and what the patient reported when she last saw him in March 2019. Patient's mother raised her voice and repeated the same things she stated in previous call on 04/16/18 about what she wants the letter to state. She continued to repeat and raise her voice louder. This RN repeated again that the NP will only address what is from the patient's last office visit. The mother stated "That's okay. She don't need to write no letter." She then ended the call.

## 2018-04-23 ENCOUNTER — Emergency Department (HOSPITAL_COMMUNITY)
Admission: EM | Admit: 2018-04-23 | Discharge: 2018-04-23 | Discharge status: Against Medical Advice | Payer: BLUE CROSS/BLUE SHIELD | Attending: Emergency Medicine | Admitting: Emergency Medicine

## 2018-04-23 ENCOUNTER — Other Ambulatory Visit: Payer: Self-pay

## 2018-04-23 ENCOUNTER — Encounter (HOSPITAL_COMMUNITY): Payer: Self-pay | Admitting: Emergency Medicine

## 2018-04-23 ENCOUNTER — Emergency Department (HOSPITAL_COMMUNITY): Payer: BLUE CROSS/BLUE SHIELD

## 2018-04-23 ENCOUNTER — Telehealth: Payer: Self-pay | Admitting: Neurology

## 2018-04-23 DIAGNOSIS — R569 Unspecified convulsions: Secondary | ICD-10-CM | POA: Insufficient documentation

## 2018-04-23 DIAGNOSIS — Z5321 Procedure and treatment not carried out due to patient leaving prior to being seen by health care provider: Secondary | ICD-10-CM | POA: Insufficient documentation

## 2018-04-23 LAB — ETHANOL: Alcohol, Ethyl (B): <10 mg/dL (ref <10)

## 2018-04-23 LAB — CBC WITH DIFFERENTIAL/PLATELET
Abs Immature Granulocytes: 0.1 10*3/uL (ref 0.0–0.1)
Basophils Absolute: 0 10*3/uL (ref 0.0–0.1)
Basophils Relative: 0 %
Eosinophils Absolute: 0 10*3/uL (ref 0.0–0.7)
Eosinophils Relative: 0 %
HCT: 49.9 % (ref 39.0–52.0)
Hemoglobin: 16.1 g/dL (ref 13.0–17.0)
Immature Granulocytes: 0 %
Lymphocytes Relative: 7 %
Lymphs Abs: 0.9 10*3/uL (ref 0.7–4.0)
MCH: 27.9 pg (ref 26.0–34.0)
MCHC: 32.3 g/dL (ref 30.0–36.0)
MCV: 86.3 fL (ref 78.0–100.0)
Monocytes Absolute: 0.6 10*3/uL (ref 0.1–1.0)
Monocytes Relative: 5 %
Neutro Abs: 10.7 10*3/uL — ABNORMAL HIGH (ref 1.7–7.7)
Neutrophils Relative %: 88 %
Platelets: 236 10*3/uL (ref 150–400)
RBC: 5.78 MIL/uL (ref 4.22–5.81)
RDW: 13 % (ref 11.5–15.5)
WBC: 12.2 10*3/uL — ABNORMAL HIGH (ref 4.0–10.5)

## 2018-04-23 LAB — COMPREHENSIVE METABOLIC PANEL
ALT: 40 U/L (ref 0–44)
AST: 33 U/L (ref 15–41)
Albumin: 5 g/dL (ref 3.5–5.0)
Alkaline Phosphatase: 51 U/L (ref 38–126)
Anion gap: 13 (ref 5–15)
BUN: 8 mg/dL (ref 6–20)
CO2: 25 mmol/L (ref 22–32)
Calcium: 10.1 mg/dL (ref 8.9–10.3)
Chloride: 98 mmol/L (ref 98–111)
Creatinine, Ser: 1.1 mg/dL (ref 0.61–1.24)
GFR calc Af Amer: >60 mL/min (ref >60)
GFR calc non Af Amer: >60 mL/min (ref >60)
Glucose, Bld: 148 mg/dL — ABNORMAL HIGH (ref 70–99)
Potassium: 4.5 mmol/L (ref 3.5–5.1)
Sodium: 136 mmol/L (ref 135–145)
Total Bilirubin: 0.5 mg/dL (ref 0.3–1.2)
Total Protein: 7.8 g/dL (ref 6.5–8.1)

## 2018-04-23 LAB — VALPROIC ACID LEVEL: Valproic Acid Lvl: 45 ug/mL — ABNORMAL LOW (ref 50.0–100.0)

## 2018-04-23 LAB — PHENYTOIN LEVEL, TOTAL: Phenytoin Lvl: 9.4 ug/mL — ABNORMAL LOW (ref 10.0–20.0)

## 2018-04-23 MED ORDER — TETANUS-DIPHTH-ACELL PERTUSSIS 5-2.5-18.5 LF-MCG/0.5 IM SUSP: 0.5000 mL | Freq: Once | INTRAMUSCULAR | Status: DC

## 2018-04-23 MED ORDER — PHENYTOIN 50 MG PO CHEW: 50.0000 mg | CHEWABLE_TABLET | Freq: Every day | ORAL | 5 refills | Status: DC

## 2018-04-23 NOTE — Telephone Encounter (Signed)
I called the mother. The dilantin and VPA levels are low.  He will go to 100 mg bid of the VPA and 350 mg daily of the dilantin. I will see him in the office.

## 2018-04-23 NOTE — ED Provider Notes (Signed)
Patient placed in Quick Look pathway, seen and evaluated   Chief Complaint: Seizures  HPI:   Patient presenting with headache, left sided facial pain and abrasions, and bilateral foot pain after seizure.  Patient takes Dilantin and Depakote.  He has not missed any doses this week.  He denies any drug or alcohol use.  Patient seizure was unwitnessed.  He has pain to the left side of his face and bilateral feet with abrasions over each.  He denies any chest pain or shortness of breath, abdominal pain, nausea, vomiting.  Tetanus not up-to-date.  ROS: +Seizure, abrasions  Physical Exam:   Gen: No distress  Neuro: Awake and Alert  Skin: Warm    Focused Exam: Patient with significant left-sided facial abrasion and swelling, abrasions to the left, but tongue intact no significant lacerations noted; abrasions to bilateral feet with tenderness, no other extremity tenderness, sensation intact bilaterally, equal bilateral grip strength, 5/5 strength all 4 extremities, no midline tenderness of the cervical, thoracic, or lumbar spine   Initiation of care has begun. The patient has been counseled on the process, plan, and necessity for staying for the completion/evaluation, and the remainder of the medical screening examination    [name], [name], PA-C 04/23/18 1934    Linwood DibblesKnapp, Jon, MD 04/29/18 [id number]

## 2018-04-23 NOTE — ED Notes (Signed)
Pt visualized leaving department with family member. Secretary first attempted to stop patient and intervene, pt did not stop to talk to staff.

## 2018-04-23 NOTE — ED Triage Notes (Signed)
Pt here with family, reports seizure 30 minutes PTA. Pt and family report pt has hx seizures, denies missing doses. Pt alert and answering questions appropriately. Left cheek swollen and abrasions noted to hands and feet, pt states that he fell on his face during seizure.

## 2018-04-23 NOTE — Telephone Encounter (Signed)
I called the patient, talk with the mother.  Patient is in the emergency room with a seizure event that occurred today.  Blood work has been done, CT scan of the brain is to be done.  The patient apparently has not missed any doses, he is on Depakote taking 2 in the morning and 2 in the evening, he is on 300 mg a day of Dilantin.  Blood work has been done, the results are not back.  The patient will need to be seen in office next week.

## 2018-04-24 ENCOUNTER — Encounter (HOSPITAL_COMMUNITY): Payer: Self-pay | Admitting: *Deleted

## 2018-04-24 ENCOUNTER — Emergency Department (HOSPITAL_COMMUNITY)
Admission: EM | Admit: 2018-04-24 | Discharge: 2018-04-24 | Disposition: A | Discharge status: Home / Self Care | Payer: BLUE CROSS/BLUE SHIELD | Attending: Emergency Medicine | Admitting: Emergency Medicine

## 2018-04-24 ENCOUNTER — Other Ambulatory Visit: Payer: Self-pay

## 2018-04-24 DIAGNOSIS — G40909 Epilepsy, unspecified, not intractable, without status epilepticus: Secondary | ICD-10-CM | POA: Insufficient documentation

## 2018-04-24 DIAGNOSIS — Z79899 Other long term (current) drug therapy: Secondary | ICD-10-CM | POA: Insufficient documentation

## 2018-04-24 MED ORDER — STERILE WATER FOR INJECTION IJ SOLN
INTRAMUSCULAR | Status: AC
  Filled 2018-04-24: qty 10

## 2018-04-24 MED ORDER — DIVALPROEX SODIUM 500 MG PO DR TAB
500.0000 mg | DELAYED_RELEASE_TABLET | Freq: Once | ORAL | Status: AC
  Administered 2018-04-24: 500 mg via ORAL
  Filled 2018-04-24: qty 1

## 2018-04-24 MED ORDER — SODIUM CHLORIDE 0.9 % IV SOLN
1000.0000 mg | Freq: Once | INTRAVENOUS | Status: AC
  Administered 2018-04-24: 1000 mg via INTRAVENOUS
  Filled 2018-04-24: qty 20

## 2018-04-24 MED ORDER — DEXTROSE 5 % IV SOLN
500.0000 mg | Freq: Once | INTRAVENOUS | Status: DC
  Filled 2018-04-24: qty 5

## 2018-04-24 MED ORDER — LORAZEPAM 2 MG/ML IJ SOLN
INTRAMUSCULAR | Status: AC
  Administered 2018-04-24: 1 mg via INTRAVENOUS
  Filled 2018-04-24: qty 1

## 2018-04-24 MED ORDER — LORAZEPAM 2 MG/ML IJ SOLN
1.0000 mg | Freq: Once | INTRAMUSCULAR | Status: AC
  Administered 2018-04-24: 1 mg via INTRAVENOUS

## 2018-04-24 MED ORDER — ZIPRASIDONE MESYLATE 20 MG IM SOLR
INTRAMUSCULAR | Status: AC
  Filled 2018-04-24: qty 20

## 2018-04-24 NOTE — ED Provider Notes (Signed)
[facility]-EMERGENCY DEPT Provider Note   CSN: [id number] Arrival date & time: 04/24/18  0119     History   Chief Complaint Chief Complaint  Patient presents with  . Seizures    HPI Dylan Moon is a 22 y.o. male.  HPI 22 year old male comes in with chief complaint of seizures. Patient is not cooperative -and threatening to leave once he arrived.  Level 5 caveat for the uncooperative nature.  According to patient's mother, patient had 3 episodes of seizures today.  He was seen at [facility], ED earlier in the day, and left AMA.  At home he had to more episodes of seizures prompting mother to call 911.   Mother alleges that patient normally gets seizure once a month.  Patient denies any substance abuse and states that he has been taking his medications as prescribed.  He is noted to have bruising to his face, and denies any headaches.  Patient is pacing up and down the hall and had to be physically restrained in order to prevent him from leaving initially for his own safety - but he calmed down once his mother arrived.  Past Medical History:  Diagnosis Date  . ADHD   . Scoliosis   . Seizures (HCC)    most recent 12/02/17    Patient Active Problem List   Diagnosis Date Noted  . 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 Noncompliance with medication regimen 12/11/2017  . Seizure (HCC) 05/24/2016  . Dehydration, mild 05/24/2016  . AKI (acute kidney injury) (HCC) 05/24/2016  . Marijuana use 05/24/2016    Past Surgical History:  Procedure Laterality Date  . NO PAST SURGERIES          Home Medications    Prior to Admission medications   Medication Sig Start Date End Date Taking? Authorizing Provider  diclofenac (VOLTAREN) 50 MG EC tablet Take 1 tablet by mouth 2 (two) times daily as needed for pain. 03/05/18  Yes [provider]  divalproex (DEPAKOTE) 500 MG DR tablet One tablet in the morning  and 2 in the evening Patient taking differently: Take 500-1,000 mg by mouth See admin instructions. One tablet in the morning and 2 in the evening 11/13/17  Yes York Spaniel, MD  phenytoin (DILANTIN) 100 MG ER capsule Take 3 capsules (300 mg total) by mouth at bedtime. 01/09/18  Yes York Spaniel, MD  ondansetron (ZOFRAN ODT) 8 MG disintegrating tablet Take 1 tablet (8 mg total) by mouth every 8 (eight) hours as needed for nausea or vomiting. Patient not taking: Reported on 04/24/2018 03/14/18   Margarita Grizzle, MD  phenytoin (DILANTIN) 50 MG tablet Chew 1 tablet (50 mg total) by mouth daily. 04/23/18   York Spaniel, MD    Family History Family History  Problem Relation Age of Onset  . Diabetes Mother   . Hypertension Mother   . Cancer Other   . Diabetes Father   . Seizures Maternal Grandfather     Social History Social History   Tobacco Use  . Smoking status: Never Smoker  . Smokeless tobacco: Never Used  Substance Use Topics  . Alcohol use: No  . Drug use: Yes    Types: Marijuana    Comment: Daily use of marijuana.     Allergies   Keppra [levetiracetam]   Review of Systems Review of Systems  Unable to perform ROS: Other  non-cooperative patient  Physical Exam Updated Vital Signs BP Marland Kitchen)  149/80   Pulse 86   Temp 99.2 F (37.3 C) (Oral)   Resp 18   SpO2 99%   Physical Exam  Constitutional: He appears well-developed.  HENT:  Head: Atraumatic.  Large ecchymotic lesion to the left side of the face  Eyes: Pupils are equal, round, and reactive to light. EOM are normal.  Neck: Neck supple.  Cardiovascular: Normal rate.  Pulmonary/Chest: Effort normal.  Musculoskeletal: He exhibits no deformity.  Neurological: He is alert.  Skin: Skin is warm.  Nursing note and vitals reviewed.    ED Treatments / Results  Labs (all labs ordered are listed, but only abnormal results are displayed) Labs Reviewed - No data to display  EKG None  Radiology Ct Head Wo  Contrast  Result Date: 04/23/2018 CLINICAL DATA:  Unwitnessed seizure.  Fall with facial trauma. EXAM: CT HEAD WITHOUT CONTRAST CT MAXILLOFACIAL WITHOUT CONTRAST CT CERVICAL SPINE WITHOUT CONTRAST TECHNIQUE: Multidetector CT imaging of the head, cervical spine, and maxillofacial structures were performed using the standard protocol without intravenous contrast. Multiplanar CT image reconstructions of the cervical spine and maxillofacial structures were also generated. COMPARISON:  Head CT and cervical spine CT 05/24/2016 FINDINGS: CT HEAD FINDINGS Brain: There is no mass, hemorrhage or extra-axial collection. The size and configuration of the ventricles and extra-axial CSF spaces are normal. There is no acute or chronic infarction. The brain parenchyma is normal. Vascular: No abnormal hyperdensity of the major intracranial arteries or dural venous sinuses. No intracranial atherosclerosis. Skull: The visualized skull base, calvarium and extracranial soft tissues are normal. CT MAXILLOFACIAL FINDINGS Osseous: --Complex facial fracture types: No LeFort, zygomaticomaxillary complex or nasoorbitoethmoidal fracture. --Simple fracture types: None. --Mandible: No fracture or dislocation. Orbits: The globes are intact. Normal appearance of the intra- and extraconal fat. Symmetric extraocular muscles and optic nerves. Sinuses: No fluid levels or advanced mucosal thickening. Soft tissues: Left facial abrasion. CT CERVICAL SPINE FINDINGS Alignment: No static subluxation. Facets are aligned. Occipital condyles and the lateral masses of C1-C2 are aligned. Skull base and vertebrae: No acute fracture. Soft tissues and spinal canal: No prevertebral fluid or swelling. No visible canal hematoma. Disc levels: No advanced spinal canal or neural foraminal stenosis. Upper chest: No pneumothorax, pulmonary nodule or pleural effusion. Other: Normal visualized paraspinal cervical soft tissues. IMPRESSION: 1. Normal brain.  No acute  intracranial abnormality. 2. Right facial abrasion without facial fracture. 3. Normal cervical spine. Electronically Signed   By: Deatra Robinson M.D.   On: 04/23/2018 20:07   Ct Cervical Spine Wo Contrast  Result Date: 04/23/2018 CLINICAL DATA:  Unwitnessed seizure.  Fall with facial trauma. EXAM: CT HEAD WITHOUT CONTRAST CT MAXILLOFACIAL WITHOUT CONTRAST CT CERVICAL SPINE WITHOUT CONTRAST TECHNIQUE: Multidetector CT imaging of the head, cervical spine, and maxillofacial structures were performed using the standard protocol without intravenous contrast. Multiplanar CT image reconstructions of the cervical spine and maxillofacial structures were also generated. COMPARISON:  Head CT and cervical spine CT 05/24/2016 FINDINGS: CT HEAD FINDINGS Brain: There is no mass, hemorrhage or extra-axial collection. The size and configuration of the ventricles and extra-axial CSF spaces are normal. There is no acute or chronic infarction. The brain parenchyma is normal. Vascular: No abnormal hyperdensity of the major intracranial arteries or dural venous sinuses. No intracranial atherosclerosis. Skull: The visualized skull base, calvarium and extracranial soft tissues are normal. CT MAXILLOFACIAL FINDINGS Osseous: --Complex facial fracture types: No LeFort, zygomaticomaxillary complex or nasoorbitoethmoidal fracture. --Simple fracture types: None. --Mandible: No fracture or dislocation. Orbits: The globes are  intact. Normal appearance of the intra- and extraconal fat. Symmetric extraocular muscles and optic nerves. Sinuses: No fluid levels or advanced mucosal thickening. Soft tissues: Left facial abrasion. CT CERVICAL SPINE FINDINGS Alignment: No static subluxation. Facets are aligned. Occipital condyles and the lateral masses of C1-C2 are aligned. Skull base and vertebrae: No acute fracture. Soft tissues and spinal canal: No prevertebral fluid or swelling. No visible canal hematoma. Disc levels: No advanced spinal canal or  neural foraminal stenosis. Upper chest: No pneumothorax, pulmonary nodule or pleural effusion. Other: Normal visualized paraspinal cervical soft tissues. IMPRESSION: 1. Normal brain.  No acute intracranial abnormality. 2. Right facial abrasion without facial fracture. 3. Normal cervical spine. Electronically Signed   By: Deatra Robinson M.D.   On: 04/23/2018 20:07   Ct Maxillofacial Wo Contrast  Result Date: 04/23/2018 CLINICAL DATA:  Unwitnessed seizure.  Fall with facial trauma. EXAM: CT HEAD WITHOUT CONTRAST CT MAXILLOFACIAL WITHOUT CONTRAST CT CERVICAL SPINE WITHOUT CONTRAST TECHNIQUE: Multidetector CT imaging of the head, cervical spine, and maxillofacial structures were performed using the standard protocol without intravenous contrast. Multiplanar CT image reconstructions of the cervical spine and maxillofacial structures were also generated. COMPARISON:  Head CT and cervical spine CT 05/24/2016 FINDINGS: CT HEAD FINDINGS Brain: There is no mass, hemorrhage or extra-axial collection. The size and configuration of the ventricles and extra-axial CSF spaces are normal. There is no acute or chronic infarction. The brain parenchyma is normal. Vascular: No abnormal hyperdensity of the major intracranial arteries or dural venous sinuses. No intracranial atherosclerosis. Skull: The visualized skull base, calvarium and extracranial soft tissues are normal. CT MAXILLOFACIAL FINDINGS Osseous: --Complex facial fracture types: No LeFort, zygomaticomaxillary complex or nasoorbitoethmoidal fracture. --Simple fracture types: None. --Mandible: No fracture or dislocation. Orbits: The globes are intact. Normal appearance of the intra- and extraconal fat. Symmetric extraocular muscles and optic nerves. Sinuses: No fluid levels or advanced mucosal thickening. Soft tissues: Left facial abrasion. CT CERVICAL SPINE FINDINGS Alignment: No static subluxation. Facets are aligned. Occipital condyles and the lateral masses of C1-C2 are  aligned. Skull base and vertebrae: No acute fracture. Soft tissues and spinal canal: No prevertebral fluid or swelling. No visible canal hematoma. Disc levels: No advanced spinal canal or neural foraminal stenosis. Upper chest: No pneumothorax, pulmonary nodule or pleural effusion. Other: Normal visualized paraspinal cervical soft tissues. IMPRESSION: 1. Normal brain.  No acute intracranial abnormality. 2. Right facial abrasion without facial fracture. 3. Normal cervical spine. Electronically Signed   By: Deatra Robinson M.D.   On: 04/23/2018 20:07    Procedures Procedures (including critical care time)  Medications Ordered in ED Medications  divalproex (DEPAKOTE) DR tablet 500 mg (has no administration in time range)  LORazepam (ATIVAN) injection 1 mg (1 mg Intravenous Given 04/24/18 0152)  phenytoin (DILANTIN) 1,000 mg in sodium chloride 0.9 % 250 mL IVPB (0 mg Intravenous Stopped 04/24/18 0300)     Initial Impression / Assessment and Plan / ED Course  I have reviewed the triage vital signs and the nursing notes.  Pertinent labs & imaging results that were available during my care of the patient were reviewed by me and considered in my medical decision making (see chart for details).     DDx: -Seizure disorder -Meningitis -Trauma -ICH -Electrolyte abnormality -Metabolic derangement -Stroke -Toxin induced seizures -Medication side effects -Hypoxia -Hypoglycemia  Patient comes in after he had a seizure.  He had 3 episodes today.  He was seen at [facility] earlier and labs were drawn, however patient  left AMA.  According to mother patient 2000 mg of Depakote prior to ED arrival.  Dilantin load has been ordered, Depakote IV has been ordered -but 500 mg only since he might have taken medications at home.  Patient is not cooperative, but that is more of a behavioral issue and not because of patient being mentally incapacitated.  Mother was able to calm patient down, and patient is decided  to stay for further treatment.  Final Clinical Impressions(s) / ED Diagnoses   Final diagnoses:  Seizure disorder [facility](HCC)    ED Discharge Orders    None       Derwood KaplanNanavati, Johnathon Olden, MD 04/24/18 [id number]

## 2018-04-24 NOTE — ED Notes (Signed)
On arrival to room by ems, pt moved over ED stretcher without difficulty. While triaging the patient, the patient would not answer questions, taking off monitoring equipment while trying to get vitals, getting out of bed, saying he wants to call his mother. Dr. Rhunette CroftNanavati requested to bedside as the patient is trying to leave. Pt not wanting to cooperate with staff. Security to bedside, patient back in bed at this time. Able to get hold of patient family who is now at the beside. Pt agreeing at this time to receive seizure medications via IV. Comfort measures. Pt mother reports the pt has had at least three seizures today, to which he has sustained abrasions to the left face and right foot areas. He also c/o some pain in the right face. He has some oral trauma from biting his tongue.

## 2018-04-24 NOTE — Telephone Encounter (Signed)
Called mother. Scheduled appt for 04/28/18 at 4pm, check in no later than 345pm. She verbalized understanding.

## 2018-04-24 NOTE — ED Triage Notes (Signed)
Pt arrives via EMS from home. Mother reported seizure lasting arpprox 2-5 minutes tonight. [facility] was seen [facility] for [facility] same yesterday. By EMS #20 left hand, no meds en route, 110/60, hr 90, r 16, 96% RA, CBG 138

## 2018-04-24 NOTE — Discharge Instructions (Signed)
We signed the ER for seizures. You are given IV load of Dilantin.  You have refused IV Depakote because it would take an hour, therefore we have given you Depakote medication in pill form.  It is prudent that you have your seizures well controlled so that it does not lead to life-threatening injuries. See your neurologist as soon as possible.  Make sure you are taking all your medications as prescribed.  [place] law prevents people with seizures or fainting from driving or operating dangerous machinery until they are free of seizures or fainting for 6 months.

## 2018-04-24 NOTE — ED Notes (Signed)
Pt called rn to room, stating it hurts at the site of IV insertion. I have checked the IV, good blood return, no signs of swelling or redness. He has pulled up all of the dressing and tape that has already been reapplied once before. The pt says that he does not want to stay any longer, anxious. Pt mother requesting further meds for anxiety. Pt refusing. Pt wants IV out and to leave. Dr. Rhunette CroftNanavati notified of the same, to the patient bedside, pt refusing to stay for IV Depakote at this time, agreeable to oral dose. Oral dose given, reinforced importance of following up with neurology, taking medications and not driving.

## 2018-04-24 NOTE — ED Notes (Signed)
Bed: [id number] Expected date:  Expected time:  Means of arrival:  Comments: EMS 22 yo male several grand mal seizures post-ictal

## 2018-04-28 ENCOUNTER — Ambulatory Visit: Payer: BLUE CROSS/BLUE SHIELD | Admitting: Neurology

## 2018-04-28 ENCOUNTER — Encounter: Payer: Self-pay | Admitting: Neurology

## 2018-04-28 VITALS — BP 118/70 | HR 78 | Ht 69.0 in | Wt 179.5 lb

## 2018-04-28 DIAGNOSIS — R569 Unspecified convulsions: Secondary | ICD-10-CM

## 2018-04-28 MED ORDER — DIVALPROEX SODIUM 500 MG PO DR TAB: 1000.0000 mg | DELAYED_RELEASE_TABLET | Freq: Two times a day (BID) | ORAL | 3 refills | Status: DC

## 2018-04-28 NOTE — Progress Notes (Signed)
Reason for visit: Seizures  Dylan Moon is an 22 y.o. male  History of present illness:  Dylan Moon is a 22 year old right-handed black male with a history of intractable seizures.  He has had multiple recent emergency room visits on 2 August, 1 August, 22 June, and 23 March for seizure problems.  The patient has had 1 or 2 severe seizures that have left him with some prolonged cognitive changes.  His mother believes that he is not quite back to his baseline mental status even now.  He recent was in the hospital for several seizures, both the Dilantin and Depakote levels were subtherapeutic.  The patient has a tendency to drink quite a bit on the weekends.  Otherwise, the mother believes that he is getting his medications in properly.  The patient does smoke marijuana.  The patient indicates that he is sleeping well.  He returns for an evaluation.  Past Medical History:  Diagnosis Date  . ADHD   . Scoliosis   . Seizures (HCC)    most recent 12/02/17    Past Surgical History:  Procedure Laterality Date  . NO PAST SURGERIES      Family History  Problem Relation Age of Onset  . Diabetes Mother   . Hypertension Mother   . Cancer Other   . Diabetes Father   . Seizures Maternal Grandfather     Social history:  reports that he has never smoked. He has never used smokeless tobacco. He reports that he has current or past drug history. Drug: Marijuana. He reports that he does not drink alcohol.    Allergies  Allergen Reactions  . Keppra [Levetiracetam] Other (See Comments)    irritability    Medications:  Prior to Admission medications   Medication Sig Start Date End Date Taking? Authorizing Provider  ALPRAZolam Prudy Feeler) 0.5 MG tablet Take 0.5 mg by mouth at bedtime as needed for anxiety.   Yes [provider]  diclofenac (VOLTAREN) 50 MG EC tablet Take 1 tablet by mouth 2 (two) times daily as needed for pain. 03/05/18  Yes [provider]  divalproex  (DEPAKOTE) 500 MG DR tablet Take 2 tablets (1,000 mg total) by mouth 2 (two) times daily. 04/28/18  Yes York Spaniel, MD  ondansetron (ZOFRAN ODT) 8 MG disintegrating tablet Take 1 tablet (8 mg total) by mouth every 8 (eight) hours as needed for nausea or vomiting. 03/14/18  Yes Margarita Grizzle, MD  phenytoin (DILANTIN) 100 MG ER capsule Take 3 capsules (300 mg total) by mouth at bedtime. 01/09/18  Yes York Spaniel, MD  phenytoin (DILANTIN) 50 MG tablet Chew 1 tablet (50 mg total) by mouth daily. 04/23/18  Yes York Spaniel, MD    ROS:  Out of a complete 14 system review of symptoms, the patient complains only of the following symptoms, and all other reviewed systems are negative.  Fatigue, excessive sweating Eye discharge, light sensitivity Cold intolerance, heat intolerance Constipation Insomnia, frequent waking Anemia Memory loss, headache, seizures Behavior problem, confusion, depression, hallucinations, suicidal thoughts  Blood pressure 118/70, pulse 78, height 5\' 9"  (1.753 m), weight 179 lb 8 oz (81.4 kg), SpO2 98 %.  Physical Exam  General: The patient is alert and cooperative at the time of the examination.  Skin: No significant peripheral edema is noted.   Neurologic Exam  Mental status: The patient is alert and oriented x 3 at the time of the examination. The patient has apparent normal recent and remote  memory, with an apparently normal attention span and concentration ability.   Cranial nerves: Facial symmetry is present. Speech is normal, no aphasia or dysarthria is noted. Extraocular movements are full. Visual fields are full.  Motor: The patient has good strength in all 4 extremities.  Sensory examination: Soft touch sensation is symmetric on the face, arms, and legs.  Coordination: The patient has good finger-nose-finger and heel-to-shin bilaterally.  Gait and station: The patient has a normal gait. Tandem gait is normal. Romberg is negative. No drift is  seen.  Reflexes: Deep tendon reflexes are symmetric.   Assessment/Plan:  1.  Intractable seizures  The patient has been increased on his Depakote to 1000 mg twice daily, we will go up on the Dilantin to a total of 350 mg daily.  The patient will have blood work done in about 2 weeks.  He will follow-up with his next scheduled office visit.  I have cautioned him about excessive alcohol intake as this may worsen seizure control.  Marlan Palau. Keith Elmer Merwin MD 04/28/2018 4:17 PM  [facility] [address] [place], [place] [id number]  Phone [phone] Fax [phone]

## 2018-04-28 NOTE — Patient Instructions (Signed)
We will dose the Depakote at 1000 mg twice daily, the Dilantin will be changed to 350 mg total daily.  I will call in 2 weeks to check blood levels.

## 2018-05-11 ENCOUNTER — Telehealth: Payer: Self-pay | Admitting: Neurology

## 2018-05-11 DIAGNOSIS — Z5181 Encounter for therapeutic drug level monitoring: Secondary | ICD-10-CM

## 2018-05-11 NOTE — Telephone Encounter (Signed)
I called the mother, the patient is coming this week and get Dilantin and Depakote levels done.

## 2018-05-15 ENCOUNTER — Other Ambulatory Visit (INDEPENDENT_AMBULATORY_CARE_PROVIDER_SITE_OTHER): Payer: Self-pay

## 2018-05-15 DIAGNOSIS — Z5181 Encounter for therapeutic drug level monitoring: Secondary | ICD-10-CM

## 2018-05-15 DIAGNOSIS — Z0289 Encounter for other administrative examinations: Secondary | ICD-10-CM

## 2018-05-16 LAB — VALPROIC ACID LEVEL: Valproic Acid Lvl: 65 ug/mL (ref 50–100)

## 2018-05-16 LAB — PHENYTOIN LEVEL, TOTAL: PHENYTOIN (DILANTIN), SERUM: 20.2 ug/mL — AB (ref 10.0–20.0)

## 2018-05-17 ENCOUNTER — Telehealth: Payer: Self-pay | Admitting: Neurology

## 2018-05-17 MED ORDER — PHENYTOIN 50 MG PO CHEW: 25.0000 mg | CHEWABLE_TABLET | Freq: Every day | ORAL | 5 refills | Status: DC

## 2018-05-17 MED ORDER — DIVALPROEX SODIUM 500 MG PO DR TAB: DELAYED_RELEASE_TABLET | ORAL | 3 refills | Status: DC

## 2018-05-17 NOTE — Telephone Encounter (Signed)
I called the patient.  The Dilantin level was slightly elevated, the Depakote level was in the low therapeutic range, I will reduce the Dilantin dose taking one half of the 50 mg tablet daily for total of 325 mg of Dilantin daily.  Depakote dose will be increased taking 1000 mg in the morning and 1500 mg in the evening.  He will stay on this dose.

## 2018-05-20 NOTE — Telephone Encounter (Signed)
Pts mother Meriam SpragueBeverly requesting a call to discuss blood work levels. Please call

## 2018-05-20 NOTE — Telephone Encounter (Signed)
I called the mother.  The Depakote will be increased to thousand milligrams in the morning and 1500 mg in the evening, he will drop back by 25 mg daily on the Dilantin dose.

## 2018-05-22 ENCOUNTER — Telehealth: Payer: Self-pay | Admitting: Neurology

## 2018-05-22 NOTE — Telephone Encounter (Signed)
I returned a call from patient`s mother who was concerned about a knot behind patient`s knee. I advised her to call her primary MD for advice.

## 2018-05-26 ENCOUNTER — Ambulatory Visit (HOSPITAL_COMMUNITY)
Admission: EM | Admit: 2018-05-26 | Discharge: 2018-05-26 | Disposition: A | Discharge status: Home / Self Care | Payer: Self-pay | Attending: Family Medicine | Admitting: Family Medicine

## 2018-05-26 ENCOUNTER — Encounter (HOSPITAL_COMMUNITY): Payer: Self-pay

## 2018-05-26 ENCOUNTER — Other Ambulatory Visit: Payer: Self-pay

## 2018-05-26 DIAGNOSIS — L02416 Cutaneous abscess of left lower limb: Secondary | ICD-10-CM

## 2018-05-26 DIAGNOSIS — K0889 Other specified disorders of teeth and supporting structures: Secondary | ICD-10-CM

## 2018-05-26 MED ORDER — TRAMADOL HCL 50 MG PO TABS: 50.0000 mg | ORAL_TABLET | Freq: Four times a day (QID) | ORAL | 0 refills | Status: DC | PRN

## 2018-05-26 MED ORDER — DOXYCYCLINE HYCLATE 100 MG PO CAPS: 100.0000 mg | ORAL_CAPSULE | Freq: Two times a day (BID) | ORAL | 0 refills | Status: AC

## 2018-05-26 NOTE — Discharge Instructions (Addendum)
Please begin doxycycline for 10 days  Apply warm compresses/hot rags to area with massage to express further drainage especially the first 24-48 hours  Return if symptoms returning or not improving  Use anti-inflammatories for pain/swelling. You may take up to 800 mg Ibuprofen every 8 hours with food. You may supplement Ibuprofen with Tylenol [phone] mg every 8 hours.   Tramadol for severe pain or nighttime pain, do not drive or take while working.  Please return or go to emergency room if developing difficulty bending the knee, worsening pain, worsening redness, developing fever

## 2018-05-26 NOTE — ED Provider Notes (Signed)
[facility]    CSN: [id number] Arrival date & time: 05/26/18  0820     History   Chief Complaint Chief Complaint  Patient presents with  . Knee Pain  . Dental Pain    HPI Dylan Moon is a 22 y.o. male history of seizure, marijuana use, presenting today for evaluation of knee pain and dental pain.  Patient states that for the past 2 weeks he has had pain to his left knee, feels it swollen and it began draining last night.  He denies previous similar issues of abscesses, denies any injury to the knee.  Denies difficulty moving his knee although it does become slightly painful with extreme flexion.  Denies fevers.  He has been applying warm rags.  He has been taking Tylenol without relief.  Also notes that he has a cracked tooth in his right lower jaw.  For the past month it has been causing him pain.  Denies facial swelling, difficulty moving jaw.  Denies neck pain or difficulty moving neck.  HPI  Past Medical History:  Diagnosis Date  . ADHD   . Scoliosis   . Seizures (HCC)    most recent 12/02/17    Patient Active Problem List   Diagnosis Date Noted  . 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 Noncompliance with medication regimen 12/11/2017  . Seizure (HCC) 05/24/2016  . Dehydration, mild 05/24/2016  . AKI (acute kidney injury) (HCC) 05/24/2016  . Marijuana use 05/24/2016    Past Surgical History:  Procedure Laterality Date  . NO PAST SURGERIES         Home Medications    Prior to Admission medications   Medication Sig Start Date End Date Taking? Authorizing Provider  ALPRAZolam Prudy Feeler) 0.5 MG tablet Take 0.5 mg by mouth at bedtime as needed for anxiety.    [provider]  diclofenac (VOLTAREN) 50 MG EC tablet Take 1 tablet by mouth 2 (two) times daily as needed for pain. 03/05/18   [provider]  divalproex (DEPAKOTE) 500 MG DR tablet Two tablets in the morning and 3 in the evening 05/17/18    York Spaniel, MD  doxycycline (VIBRAMYCIN) 100 MG capsule Take 1 capsule (100 mg total) by mouth 2 (two) times daily for 10 days. 05/26/18 06/05/18  Wieters, Hallie C, PA-C  ondansetron (ZOFRAN ODT) 8 MG disintegrating tablet Take 1 tablet (8 mg total) by mouth every 8 (eight) hours as needed for nausea or vomiting. 03/14/18   Margarita Grizzle, MD  phenytoin (DILANTIN) 100 MG ER capsule Take 3 capsules (300 mg total) by mouth at bedtime. 01/09/18   York Spaniel, MD  phenytoin (DILANTIN) 50 MG tablet Chew 0.5 tablets (25 mg total) by mouth daily. 05/17/18   York Spaniel, MD  traMADol (ULTRAM) 50 MG tablet Take 1 tablet (50 mg total) by mouth every 6 (six) hours as needed. 05/26/18   Wieters, Junius Creamer, PA-C    Family History Family History  Problem Relation Age of Onset  . Diabetes Mother   . Hypertension Mother   . Cancer Other   . Diabetes Father   . Seizures Maternal Grandfather     Social History Social History   Tobacco Use  . Smoking status: Never Smoker  . Smokeless tobacco: Never Used  Substance Use Topics  . Alcohol use: No  . Drug use: Yes    Types: Marijuana    Comment: Daily use of marijuana.  Allergies   Keppra [levetiracetam]   Review of Systems Review of Systems  Constitutional: Negative for fatigue and fever.  HENT: Positive for dental problem.   Eyes: Negative for redness, itching and visual disturbance.  Respiratory: Negative for shortness of breath.   Cardiovascular: Negative for chest pain and leg swelling.  Gastrointestinal: Negative for nausea and vomiting.  Musculoskeletal: Positive for arthralgias, joint swelling and myalgias.  Skin: Positive for color change and wound. Negative for rash.  Neurological: Negative for dizziness, syncope, weakness, light-headedness and headaches.     Physical Exam Triage Vital Signs ED Triage Vitals  Enc Vitals Group     BP 05/26/18 0835 (!) 146/88     Pulse Rate 05/26/18 0835 73     Resp 05/26/18  0835 16     Temp 05/26/18 0835 97.9 F (36.6 C)     Temp Source 05/26/18 0835 Oral     SpO2 05/26/18 0835 100 %     Weight 05/26/18 0836 180 lb (81.6 kg)     Height --      Head Circumference --      Peak Flow --      Pain Score --      Pain Loc --      Pain Edu? --      Excl. in GC? --    No data found.  Updated Vital Signs BP (!) 146/88 (BP Location: Right Arm)   Pulse 73   Temp 97.9 F (36.6 C) (Oral)   Resp 16   Wt 180 lb (81.6 kg)   SpO2 100%   BMI 26.58 kg/m   Visual Acuity Right Eye Distance:   Left Eye Distance:   Bilateral Distance:    Right Eye Near:   Left Eye Near:    Bilateral Near:     Physical Exam  Constitutional: He appears well-developed and well-nourished.  HENT:  Head: Normocephalic and atraumatic.  Eyes: Conjunctivae are normal.  Neck: Neck supple.  Cardiovascular: Normal rate and regular rhythm.  No murmur heard. Pulmonary/Chest: Effort normal and breath sounds normal. No respiratory distress.  Abdominal: He exhibits no distension.  Musculoskeletal: He exhibits no edema.  Inferolateral area of left knee with induration, central area with central peeling and drainage, minimal fluctuance palpated.  Full active range of motion of knee, minimal swelling to rest of knee, no overlying erythema or increased warmth.  Neurological: He is alert.  Skin: Skin is warm and dry.  Psychiatric: He has a normal mood and affect.  Nursing note and vitals reviewed.    UC Treatments / Results  Labs (all labs ordered are listed, but only abnormal results are displayed) Labs Reviewed - No data to display  EKG None  Radiology No results found.  Procedures Procedures (including critical care time) Aspiration performed to ensure that synovial fluid did not appear infected, patient and mother verbalized consent, was approached from superior lateral area, cleaned with Betadine, pain ease spray used, needle was inserted without resistance, no fluid  obtained.  No immediate complications.  Patient tolerated procedure well. Medications Ordered in UC Medications - No data to display  Initial Impression / Assessment and Plan / UC Course  I have reviewed the triage vital signs and the nursing notes.  Pertinent labs & imaging results that were available during my care of the patient were reviewed by me and considered in my medical decision making (see chart for details).     Patient with abscess which appears superficial to the knee,  does not appear to have septic joint at this time, vital signs stable.  Will initiate patient on doxycycline, did not appear to affect concentrations of Dilantin, but may affect concentrations with doxycycline.  We will also continue warm compresses and massage to express further drainage.  Did not feel an ED was warranted at this time as it was already actively draining.  Patient with cracked tooth, does not have signs of dental abscess at this time.  Will treat with anti-inflammatories.  Provided tramadol to use for severe pain regarding the dental pain or nighttime pain.  Discussed sedation regarding this.  Follow-up if having limited range of motion of knee, worsening pain, symptoms not improving, developing fever.Discussed strict return precautions. Patient verbalized understanding and is agreeable with plan.  Final Clinical Impressions(s) / UC Diagnoses   Final diagnoses:  Abscess of knee, left  Pain, dental     Discharge Instructions     Please begin doxycycline for 10 days  Apply warm compresses/hot rags to area with massage to express further drainage especially the first 24-48 hours  Return if symptoms returning or not improving  Use anti-inflammatories for pain/swelling. You may take up to 800 mg Ibuprofen every 8 hours with food. You may supplement Ibuprofen with Tylenol [phone] mg every 8 hours.   Tramadol for severe pain or nighttime pain, do not drive or take while working.  Please return  or go to emergency room if developing difficulty bending the knee, worsening pain, worsening redness, developing fever   ED Prescriptions    Medication Sig Dispense Auth. Provider   doxycycline (VIBRAMYCIN) 100 MG capsule Take 1 capsule (100 mg total) by mouth 2 (two) times daily for 10 days. 20 capsule Wieters, Hallie C, PA-C   traMADol (ULTRAM) 50 MG tablet Take 1 tablet (50 mg total) by mouth every 6 (six) hours as needed. 15 tablet Wieters, [place] C, PA-C     Controlled Substance Prescriptions [place] Controlled Substance Registry consulted? Yes, I have consulted the [place] Controlled Substances Registry for this patient, and feel the risk/benefit ratio today is favorable for proceeding with this prescription for a controlled substance.   Sharyon Cable [place], [place] 05/26/18 [phone]

## 2018-05-26 NOTE — ED Triage Notes (Signed)
Pt states he has a tooth ache and left knee pain x 2 weeks or more.

## 2018-06-12 NOTE — Progress Notes (Deleted)
[facility]  PATIENT: Dylan Moon DOB: 1996-01-05   REASON FOR VISIT: Follow-up for seizure disorder HISTORY FROM:    HISTORY OF PRESENT ILLNESS: 8/6/19[name] is a 22 year old right-handed black male with a history of intractable seizures.  He has had multiple recent emergency room visits on 2 August, 1 August, 22 June, and 23 March for seizure problems.  The patient has had 1 or 2 severe seizures that have left him with some prolonged cognitive changes.  His mother believes that he is not quite back to his baseline mental status even now.  He recent was in the hospital for several seizures, both the Dilantin and Depakote levels were subtherapeutic.  The patient has a tendency to drink quite a bit on the weekends.  Otherwise, the mother believes that he is getting his medications in properly.  The patient does smoke marijuana.  The patient indicates that he is sleeping well.  He returns for an evaluation. UPDATE 3/21/2019CM Dylan Moon, 22 year old male returns for follow-up with his mom is a history of seizure event in February after missing 2 days worth of medication.  He has had problems with irritability on Keppra.  He was switched to Depakote by Dr. Anne Hahn but repeat lab work showed Depakote level at 9 the patient was noncompliant .  Mother called in again on 11/24/2017 saying the Depakote was not working however he was not taking the medication as prescribed.  Should be taking 500 mg in the morning thousand milligrams in the evening.  The Keppra was due to be tapered and discontinued by the first week in March however on return visit today he is still taking an afternoon dose of Keppra.  Patient had another seizure on 12/02/17 where he bit his tongue.  He was advised to use a mouthwash and keep the area clean.  He returns for reevaluation today.  He claims he is compliant with the Depakote.  He was made aware he cannot operate a motor vehicle for 6 months.  Mother still  says she does not monitor his medication compliance and was encouraged to do so. 11/13/17 [name] is a 22 year old right-handed black male with a history of seizures. The patient has had another seizure event that occurred 4 days prior to this evaluation. The patient admits to missing at least 2 days worth of medications. He has had problems on the Keppra with irritability, he is being switched to Depakote. He has had problems with appetite, he is not sleeping well.The patient continues to smoke marijuana on a regular basis. The patient is not operatinga motor vehicle. He comes in with his mother today. The mother really has no idea whether the patient is taking hismedications or not. She does not monitor his dosing schedules. REVIEW OF SYSTEMS: Full 14 system review of systems performed and notable only for those listed, all others are neg:  Constitutional: neg  Cardiovascular: neg Ear/Nose/Throat: neg  Skin: neg Eyes: neg Respiratory: neg Gastroitestinal: neg  Hematology/Lymphatic: neg  Endocrine: neg Musculoskeletal:neg Allergy/Immunology: neg Neurological: neg Psychiatric: neg Sleep : neg   ALLERGIES: Allergies  Allergen Reactions  . Keppra [Levetiracetam] Other (See Comments)    irritability    HOME MEDICATIONS: Outpatient Medications Prior to Visit  Medication Sig Dispense Refill  . ALPRAZolam (XANAX) 0.5 MG tablet Take 0.5 mg by mouth at bedtime as needed for anxiety.    . diclofenac (VOLTAREN) 50 MG EC tablet Take 1 tablet by mouth 2 (two) times daily as needed  for pain.  2  . divalproex (DEPAKOTE) 500 MG DR tablet Two tablets in the morning and 3 in the evening 120 tablet 3  . ondansetron (ZOFRAN ODT) 8 MG disintegrating tablet Take 1 tablet (8 mg total) by mouth every 8 (eight) hours as needed for nausea or vomiting. 20 tablet 0  . phenytoin (DILANTIN) 100 MG ER capsule Take 3 capsules (300 mg total) by mouth at bedtime. 90 capsule 3  . phenytoin (DILANTIN)  50 MG tablet Chew 0.5 tablets (25 mg total) by mouth daily. 30 tablet 5  . traMADol (ULTRAM) 50 MG tablet Take 1 tablet (50 mg total) by mouth every 6 (six) hours as needed. 15 tablet 0   No facility-administered medications prior to visit.     PAST MEDICAL HISTORY: Past Medical History:  Diagnosis Date  . ADHD   . Scoliosis   . Seizures (HCC)    most recent 12/02/17    PAST SURGICAL HISTORY: Past Surgical History:  Procedure Laterality Date  . NO PAST SURGERIES      FAMILY HISTORY: Family History  Problem Relation Age of Onset  . Diabetes Mother   . Hypertension Mother   . Cancer Other   . Diabetes Father   . Seizures Maternal Grandfather     SOCIAL HISTORY: Social History   Socioeconomic History  . Marital status: Single    Spouse name: Not on file  . Number of children: 0  . Years of education: HS  . Highest education level: Not on file  Occupational History  . Occupation: Arboriculturist  Social Needs  . Financial resource strain: Not on file  . Food insecurity:    Worry: Not on file    Inability: Not on file  . Transportation needs:    Medical: Not on file    Non-medical: Not on file  Tobacco Use  . Smoking status: Never Smoker  . Smokeless tobacco: Never Used  Substance and Sexual Activity  . Alcohol use: No  . Drug use: Yes    Types: Marijuana    Comment: Daily use of marijuana.  . Sexual activity: Not on file  Lifestyle  . Physical activity:    Days per week: Not on file    Minutes per session: Not on file  . Stress: Not on file  Relationships  . Social connections:    Talks on phone: Not on file    Gets together: Not on file    Attends religious service: Not on file    Active member of club or organization: Not on file    Attends meetings of clubs or organizations: Not on file    Relationship status: Not on file  . Intimate partner violence:    Fear of current or ex partner: Not on file    Emotionally abused: Not on  file    Physically abused: Not on file    Forced sexual activity: Not on file  Other Topics Concern  . Not on file  Social History Narrative   Lives at home his mother.   3-4 sodas per week.   Right-handed.     PHYSICAL EXAM  There were no vitals filed for this visit. There is no height or weight on file to calculate BMI.  Generalized: Well developed, in no acute distress  Head: normocephalic and atraumatic,. Oropharynx benign  Neck: Supple, no carotid bruits  Cardiac: Regular rate rhythm, no murmur  Musculoskeletal: No deformity   Neurological examination   Mentation: Alert  oriented to time, place, history taking. Attention span and concentration appropriate. Recent and remote memory intact.  Follows all commands speech and language fluent.   Cranial nerve II-XII: Fundoscopic exam reveals sharp disc margins.Pupils were equal round reactive to light extraocular movements were full, visual field were full on confrontational test. Facial sensation and strength were normal. hearing was intact to finger rubbing bilaterally. Uvula tongue midline. head turning and shoulder shrug were normal and symmetric.Tongue protrusion into cheek strength was normal. Motor: normal bulk and tone, full strength in the BUE, BLE, fine finger movements normal, no pronator drift. No focal weakness Sensory: normal and symmetric to light touch, pinprick, and  Vibration, proprioception  Coordination: finger-nose-finger, heel-to-shin bilaterally, no dysmetria Reflexes: Brachioradialis 2/2, biceps 2/2, triceps 2/2, patellar 2/2, Achilles 2/2, plantar responses were flexor bilaterally. Gait and Station: Rising up from seated position without assistance, normal stance,  moderate stride, good arm swing, smooth turning, able to perform tiptoe, and heel walking without difficulty. Tandem gait is steady  DIAGNOSTIC DATA (LABS, IMAGING, TESTING) - I reviewed patient records, labs, notes, testing and imaging myself  where available.  Lab Results  Component Value Date   WBC 12.2 (H) 04/23/2018   HGB 16.1 04/23/2018   HCT 49.9 04/23/2018   MCV 86.3 04/23/2018   PLT 236 04/23/2018      Component Value Date/Time   NA 136 04/23/2018 1937   NA 140 12/11/2017 1033   K 4.5 04/23/2018 1937   CL 98 04/23/2018 1937   CO2 25 04/23/2018 1937   GLUCOSE 148 (H) 04/23/2018 1937   BUN 8 04/23/2018 1937   BUN 11 12/11/2017 1033   CREATININE 1.10 04/23/2018 1937   CALCIUM 10.1 04/23/2018 1937   PROT 7.8 04/23/2018 1937   PROT 7.0 12/11/2017 1033   ALBUMIN 5.0 04/23/2018 1937   ALBUMIN 4.8 12/11/2017 1033   AST 33 04/23/2018 1937   ALT 40 04/23/2018 1937   ALKPHOS 51 04/23/2018 1937   BILITOT 0.5 04/23/2018 1937   BILITOT 0.4 12/11/2017 1033   GFRNONAA >60 04/23/2018 1937   GFRAA >60 04/23/2018 1937   ASSESSMENT AND PLAN  22 y.o. year old male  has a past medical history of ADHD, Scoliosis, and Seizures (HCC). here with ***  Intractable seizures  The patient has been increased on his Depakote to 1000 mg twice daily, we will go up on the Dilantin to a total of 350 mg daily.  The patient will have blood work done in about 2 weeks.  He will follow-up with his next scheduled office visit.  I have cautioned him about excessive alcohol intake as this may worsen seizure control  Nilda RiggsNancy Carolyn Jahliyah Trice, [facility] HospitalGNP, [facility] LLCBC, APRN  [facility] [address], [address] [place], [place] [address] [phone]

## 2018-06-15 ENCOUNTER — Ambulatory Visit: Payer: Self-pay | Admitting: Nurse Practitioner

## 2018-06-17 ENCOUNTER — Encounter: Payer: Self-pay | Admitting: Nurse Practitioner

## 2018-06-29 ENCOUNTER — Telehealth: Payer: Self-pay | Admitting: Neurology

## 2018-06-29 DIAGNOSIS — Z5181 Encounter for therapeutic drug level monitoring: Secondary | ICD-10-CM

## 2018-06-29 DIAGNOSIS — R569 Unspecified convulsions: Secondary | ICD-10-CM

## 2018-06-29 NOTE — Telephone Encounter (Signed)
Pt mother(on DPR-[place]) has called asking to speak with Dr Theodoro Kos is aware he is not in office until 10-08) due to irrattion pt is still having.  Mother states pharmacy suggested extended release for divalproex (DEPAKOTE) 500 MG DR tablet Please call

## 2018-06-29 NOTE — Telephone Encounter (Signed)
I called the patient, left a message.  The patient apparently has had ongoing irritability.  We will need to get a revisit set up sometime in the next 4 to 6 weeks.  I noticed that he has Ultram on his medication list.  I have asked that they not allow him to take Ultram as this lowers the seizure threshold, this gentleman has had significant issues with seizure control.

## 2018-06-30 ENCOUNTER — Other Ambulatory Visit: Payer: Self-pay | Admitting: *Deleted

## 2018-06-30 ENCOUNTER — Encounter: Payer: Self-pay | Admitting: *Deleted

## 2018-06-30 MED ORDER — CITALOPRAM HYDROBROMIDE 10 MG PO TABS
ORAL_TABLET | ORAL | 3 refills | Status: DC
Start: 2018-06-30 — End: 2018-09-28

## 2018-06-30 NOTE — Addendum Note (Signed)
Addended by: York Spaniel on: 06/30/2018 02:58 PM   Modules accepted: Orders

## 2018-06-30 NOTE — Telephone Encounter (Signed)
I called the mother.  The patient has had ongoing problems with cognitive processing since that prolonged seizure in the spring 2019, he has had some irritability and a change in his personality.  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  We will add Celexa 10 mg daily for 2 weeks and go to 20 mg daily.  They will call for any dose adjustments.

## 2018-06-30 NOTE — Telephone Encounter (Signed)
I called and spoke to his mother on [place] Salvatore Marvel at [phone].  Reports her son's Medicaid coverage has expired and she is in the process of getting this resolved.  She is hopeful to get this worked out by next week.  She will call back to schedule a follow up for him.  He no longer has Tramadol at home.  She verbalized understanding that he should not take this medication in the future.  She is asking to discuss his irritability over the phone, until he is able to be seen.

## 2018-07-02 ENCOUNTER — Encounter: Payer: Self-pay | Admitting: Neurology

## 2018-07-02 NOTE — Telephone Encounter (Signed)
I called the mother.  The patient has had some cognitive changes following a prolonged seizure on 17 December 2017.  The patient has had persistent cognitive changes following this, he has had irritability, his seizures have not been well controlled.  Currently, the patient is not able to maintain gainful employment as a result of the above issues.  I will dictate a letter.

## 2018-07-02 NOTE — Telephone Encounter (Signed)
I spoke with Dylan Moon to let her know the letter was ready and she asked that I leave it at the front desk for her or her daughter to pick up.

## 2018-07-02 NOTE — Telephone Encounter (Signed)
Pts mother [name] requesting a call to discuss Dr. Anne Hahn witting a letter explaining due to the seizure activity and increase of irritability the pt is unable to work. Please advise at [phone]

## 2018-07-10 ENCOUNTER — Other Ambulatory Visit: Payer: Self-pay

## 2018-07-10 ENCOUNTER — Observation Stay (HOSPITAL_COMMUNITY)
Admission: EM | Admit: 2018-07-10 | Discharge: 2018-07-11 | Discharge status: Against Medical Advice | Payer: Medicaid Other | Attending: Internal Medicine | Admitting: Internal Medicine

## 2018-07-10 ENCOUNTER — Encounter (HOSPITAL_COMMUNITY): Payer: Self-pay | Admitting: Emergency Medicine

## 2018-07-10 DIAGNOSIS — F909 Attention-deficit hyperactivity disorder, unspecified type: Secondary | ICD-10-CM | POA: Insufficient documentation

## 2018-07-10 DIAGNOSIS — Z79899 Other long term (current) drug therapy: Secondary | ICD-10-CM | POA: Insufficient documentation

## 2018-07-10 DIAGNOSIS — G40909 Epilepsy, unspecified, not intractable, without status epilepticus: Secondary | ICD-10-CM | POA: Diagnosis not present

## 2018-07-10 DIAGNOSIS — F1290 Cannabis use, unspecified, uncomplicated: Secondary | ICD-10-CM | POA: Diagnosis not present

## 2018-07-10 DIAGNOSIS — M419 Scoliosis, unspecified: Secondary | ICD-10-CM | POA: Insufficient documentation

## 2018-07-10 DIAGNOSIS — R569 Unspecified convulsions: Secondary | ICD-10-CM

## 2018-07-10 DIAGNOSIS — Z888 Allergy status to other drugs, medicaments and biological substances status: Secondary | ICD-10-CM | POA: Insufficient documentation

## 2018-07-10 DIAGNOSIS — F4325 Adjustment disorder with mixed disturbance of emotions and conduct: Secondary | ICD-10-CM | POA: Insufficient documentation

## 2018-07-10 DIAGNOSIS — N179 Acute kidney failure, unspecified: Secondary | ICD-10-CM | POA: Insufficient documentation

## 2018-07-10 LAB — CBC WITH DIFFERENTIAL/PLATELET
Abs Immature Granulocytes: 0.02 10*3/uL (ref 0.00–0.07)
BASOS ABS: 0 10*3/uL (ref 0.0–0.1)
Basophils Relative: 0 %
EOS PCT: 0 %
Eosinophils Absolute: 0 10*3/uL (ref 0.0–0.5)
HEMATOCRIT: 50.2 % (ref 39.0–52.0)
HEMOGLOBIN: 16.2 g/dL (ref 13.0–17.0)
IMMATURE GRANULOCYTES: 0 %
LYMPHS ABS: 0.6 10*3/uL — AB (ref 0.7–4.0)
Lymphocytes Relative: 12 %
MCH: 27.7 pg (ref 26.0–34.0)
MCHC: 32.3 g/dL (ref 30.0–36.0)
MCV: 86 fL (ref 80.0–100.0)
Monocytes Absolute: 0.3 10*3/uL (ref 0.1–1.0)
Monocytes Relative: 6 %
NEUTROS ABS: 3.9 10*3/uL (ref 1.7–7.7)
NEUTROS PCT: 82 %
NRBC: 0 % (ref 0.0–0.2)
Platelets: 237 10*3/uL (ref 150–400)
RBC: 5.84 MIL/uL — AB (ref 4.22–5.81)
RDW: 13.8 % (ref 11.5–15.5)
WBC: 4.8 10*3/uL (ref 4.0–10.5)

## 2018-07-10 LAB — VALPROIC ACID LEVEL: VALPROIC ACID LVL: 93 ug/mL (ref 50.0–100.0)

## 2018-07-10 LAB — ETHANOL: Alcohol, Ethyl (B): <10 mg/dL (ref <10)

## 2018-07-10 LAB — PHENYTOIN LEVEL, TOTAL: PHENYTOIN LVL: 15.3 ug/mL (ref 10.0–20.0)

## 2018-07-10 MED ORDER — SODIUM CHLORIDE 0.9 % IV SOLN
200.0000 mg | Freq: Once | INTRAVENOUS | Status: AC
  Administered 2018-07-11: 200 mg via INTRAVENOUS
  Filled 2018-07-10: qty 20

## 2018-07-10 MED ORDER — LORAZEPAM 2 MG/ML IJ SOLN
INTRAMUSCULAR | Status: DC
Start: 2018-07-10 — End: 2018-07-11
  Filled 2018-07-10: qty 1

## 2018-07-10 MED ORDER — LORAZEPAM 2 MG/ML IJ SOLN
1.0000 mg | Freq: Once | INTRAMUSCULAR | Status: AC
  Administered 2018-07-10: 1 mg via INTRAVENOUS
  Filled 2018-07-10: qty 1

## 2018-07-10 NOTE — Telephone Encounter (Signed)
Pts mother called to inform Dr. Anne Hahn the pt had an absent seizure this morning and would like to discuss this with Dr. Anne Hahn only. Please advise

## 2018-07-10 NOTE — Addendum Note (Signed)
Addended by: York Spaniel on: 07/10/2018 01:05 PM   Modules accepted: Orders
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
## 2018-07-10 NOTE — Telephone Encounter (Signed)
I called the mother, the patient had what appeared to be a partial complex seizure with a staring event.  She has never seen this before, but the seizure medications themselves may change the manifestation of the seizure.  The patient will come in next week for blood work check.

## 2018-07-10 NOTE — ED Provider Notes (Signed)
Emergency Department Provider Note   I have reviewed the triage vital signs and the nursing notes.   HISTORY  Chief Complaint Seizures   HPI Dylan Moon is a 22 y.o. male with PMH of seizure resents to the emergency department for evaluation of breakthrough seizure today.  He has had 3 total seizures.  Patient is postictal and unable to give me significant information regarding his seizures today.  The patient's mother is at bedside states that he had to episodes this morning where he stared off into the distance and was confused afterwards.  He has never had these type of seizures before.  She states that the third seizure occurred just prior to calling EMS.  They witnessed a 3 to 5-minute tonic-clonic seizure.  Patient was confused and combative on scene and required Haldol and midazolam.  Mom states that he has been on the seizure but regimen of Dilantin and phenytoin 4 months.  She is not aware of him using any drugs.  He seems to be sleeping well.  Patient does not drink alcohol.  Mom states that she helps him to remember to take his medicines so knows he is compliant.   Level 5 caveat: Post-ictal.   Past Medical History:  Diagnosis Date  . ADHD   . Scoliosis   . Seizures (HCC)    most recent 12/02/17    Patient Active Problem List   Diagnosis Date Noted  . Seizures (HCC) 07/10/2018  . 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 Noncompliance with medication regimen 12/11/2017  . Seizure (HCC) 05/24/2016  . Dehydration, mild 05/24/2016  . AKI (acute kidney injury) (HCC) 05/24/2016  . Marijuana use 05/24/2016    Past Surgical History:  Procedure Laterality Date  . NO PAST SURGERIES      Allergies Keppra [levetiracetam]; Tramadol; and Vimpat [lacosamide]  Family History  Problem Relation Age of Onset  . Diabetes Mother   . Hypertension Mother   . Cancer Other   . Diabetes Father   . Seizures Maternal Grandfather      Social History Social History   Tobacco Use  . Smoking status: Never Smoker  . Smokeless tobacco: Never Used  Substance Use Topics  . Alcohol use: No  . Drug use: Yes    Types: Marijuana    Comment: Daily use of marijuana.    Review of Systems  Level 5 caveat: Post-ictal.   ____________________________________________   PHYSICAL EXAM:  VITAL SIGNS: Vitals:   07/10/18 2245 07/11/18 0045  BP: 130/64 (!) 144/91  Pulse:    Resp:    SpO2:      Constitutional: Intermittently alert and purposeful. Saying "yes" and "no" to simple questions and trying to climb out of bed. Eyes: Conjunctivae are normal. PERRL.  Head: Atraumatic. Nose: No congestion/rhinnorhea. Mouth/Throat: Mucous membranes are moist. Oropharynx non-erythematous. Neck: No stridor.   Cardiovascular: Normal rate, regular rhythm. Good peripheral circulation. Grossly normal heart sounds.   Respiratory: Normal respiratory effort.  No retractions. Lungs CTAB. Gastrointestinal: Soft and nontender. No distention.  Musculoskeletal: No lower extremity tenderness nor edema. No gross deformities of extremities. Neurologic:  Normal speech. Moving all extremities equally but patient is confused.  Skin:  Skin is warm, dry and intact. No rash noted.  ____________________________________________   LABS (all labs ordered are listed, but only abnormal results are displayed)  Labs Reviewed  CBC WITH DIFFERENTIAL/PLATELET - Abnormal; Notable for the following components:      Result Value  RBC 5.84 (*)    Lymphs Abs 0.6 (*)    All other components within normal limits  VALPROIC ACID LEVEL  ETHANOL  PHENYTOIN LEVEL, TOTAL  PHENYTOIN LEVEL, FREE AND TOTAL   ____________________________________________  EKG   EKG Interpretation  Date/Time:  Friday July 10 2018 17:52:25 EDT Ventricular Rate:  69 PR Interval:    QRS Duration: 84 QT Interval:  350 QTC Calculation: 375 R Axis:   77 Text Interpretation:   Sinus rhythm No STEMI.  Confirmed by Alona Bene [phone]) on 07/10/2018 5:55:23 PM       ____________________________________________  RADIOLOGY  No results found.  ____________________________________________   PROCEDURES  Procedure(s) performed:   Procedures  None ____________________________________________   INITIAL IMPRESSION / ASSESSMENT AND PLAN / ED COURSE  Pertinent labs & imaging results that were available during my care of the patient were reviewed by me and considered in my medical decision making (see chart for details).  Patient presents to the emergency department with breakthrough seizures today.  He appears to have had 2 absence seizure type seizures earlier this morning with a postictal phase followed by more typical tonic-clonic activity witnessed by EMS.  The third seizure lasted 3 to 5 minutes.  She is reportedly compliant with his home medications.  Plan to send the levels.  No clear provoking factors for his breakthrough seizure although does have them once every 1 to 2 months according to mom.   Patient continues to have some mild confusion but is moking improvements. Labs unremarkable. Spoke with Dr. Amada Jupiter who has seen the patient and advises observational admission. Labs reviewed with no acute findings.   Discussed patient's case with Hospitlaist to request admission. Patient and family (if present) updated with plan. Care transferred to Hospitalist service.  I reviewed all nursing notes, vitals, pertinent old records, EKGs, labs, imaging (as available).  ____________________________________________  FINAL CLINICAL IMPRESSION(S) / ED DIAGNOSES  Final diagnoses:  Seizure (HCC)     MEDICATIONS GIVEN DURING THIS VISIT:  Medications  LORazepam (ATIVAN) injection 1 mg (1 mg Intravenous Given 07/10/18 1929)  lacosamide (VIMPAT) 200 mg in sodium chloride 0.9 % 25 mL IVPB (0 mg Intravenous Stopped 07/11/18 0057)     Note:  This document  was prepared using Dragon voice recognition software and may include unintentional dictation errors.  Alona Bene, MD Emergency Medicine    Long, Arlyss Repress, MD 07/11/18 [phone]

## 2018-07-10 NOTE — ED Triage Notes (Signed)
Pt BIB [facility] from home, witnessed seizure by family this am and another this evening about 30 minutes PTA. EMS witnessed second seizure lasting about 3-5 minutes. Pt became combative, given 5mg  haldol and 5mg  midazolam PTA. On arrival, pt alert to self only, attempting to get out of bed, easily redirectable. 1mg  ativan IV given per verbal order Dr. Jacqulyn Bath.

## 2018-07-11 MED ORDER — PHENYTOIN SODIUM EXTENDED 100 MG PO CAPS: 200.0000 mg | ORAL_CAPSULE | Freq: Every day | ORAL | Status: DC

## 2018-07-11 MED ORDER — DIVALPROEX SODIUM 250 MG PO DR TAB: 1000.0000 mg | DELAYED_RELEASE_TABLET | Freq: Two times a day (BID) | ORAL | Status: DC

## 2018-07-11 MED ORDER — PHENYTOIN 50 MG PO CHEW
25.0000 mg | CHEWABLE_TABLET | Freq: Every day | ORAL | Status: DC
  Filled 2018-07-11: qty 0.5

## 2018-07-11 MED ORDER — ENOXAPARIN SODIUM 40 MG/0.4ML ~~LOC~~ SOLN: 40.0000 mg | SUBCUTANEOUS | Status: DC

## 2018-07-11 MED ORDER — LORAZEPAM 2 MG/ML IJ SOLN
1.0000 mg | Freq: Once | INTRAMUSCULAR | Status: DC
  Filled 2018-07-11: qty 1

## 2018-07-11 MED ORDER — LORAZEPAM 2 MG/ML IJ SOLN: 1.0000 mg | INTRAMUSCULAR | Status: DC | PRN

## 2018-07-11 MED ORDER — PHENYTOIN SODIUM EXTENDED 100 MG PO CAPS: 100.0000 mg | ORAL_CAPSULE | Freq: Every day | ORAL | Status: DC

## 2018-07-11 MED ORDER — PHENYTOIN 50 MG PO CHEW: 25.0000 mg | CHEWABLE_TABLET | ORAL | Status: DC

## 2018-07-11 NOTE — ED Notes (Signed)
Pt increasingly agitated and up out of bed wishing to leave. IV removed. Pt wishes to leave. Hospitalist notified. Attempted to convince patient to stay with no success. Pt left with mother ambulatory and in no distress.
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
## 2018-07-13 MED ORDER — DIVALPROEX SODIUM 500 MG PO DR TAB: DELAYED_RELEASE_TABLET | ORAL | 3 refills | Status: DC

## 2018-07-13 NOTE — Telephone Encounter (Signed)
I called and left a message, the patient will need to come and get blood work, we will readjust medication dosing depending upon what the blood shows.

## 2018-07-13 NOTE — Telephone Encounter (Signed)
Pt mother(on [place]) is asking to speak back with Dr Anne Hahn. It is re: seizure pt had on Friday, please call

## 2018-07-13 NOTE — Addendum Note (Signed)
Addended by: York Spaniel on: 07/13/2018 05:15 PM   Modules accepted: Orders

## 2018-07-13 NOTE — Telephone Encounter (Signed)
Pt mother(on DPR) has called.  Phone rep began reading message to mother of pt and she stated pt has already had blood work done .  Pt mother then made request that Dr Anne Hahn just gives her a call, call then ended

## 2018-07-13 NOTE — Telephone Encounter (Signed)
I called the mother.  The patient was in the hospital on 10 July 2018 with recurrent seizures.  He had therapeutic levels of Dilantin and Depakote, we will go up a little bit further on the Depakote taking 2 in the morning and 3 in the evening of the 500 mg tablets.  I will get a referral set up for an evaluation with the epilepsy center at [facility].

## 2018-07-14 LAB — PHENYTOIN LEVEL, FREE AND TOTAL: Phenytoin, Total: 17.7 ug/mL (ref 10.0–20.0)

## 2018-07-23 ENCOUNTER — Telehealth: Payer: Self-pay | Admitting: Neurology

## 2018-07-23 NOTE — Telephone Encounter (Signed)
Patient is going to hold off for now going to [facility] because patient does not have insurance now. Patient only has patient assistance   Through [employer].

## 2018-07-23 NOTE — Telephone Encounter (Signed)
Events noted, we can cancel the referral to [facility].

## 2018-07-27 NOTE — Telephone Encounter (Signed)
Order CX.

## 2018-08-19 ENCOUNTER — Telehealth: Payer: Self-pay | Admitting: Neurology

## 2018-08-19 NOTE — Telephone Encounter (Signed)
Spoke to mother no change in his medications. Last a few minutes, no injuries.  Dilantin 200mg  po am and 125mg  po pm. Please advise.

## 2018-08-19 NOTE — Telephone Encounter (Signed)
Pts mother ([place]) called stating the pt had a seizure at about 9:15am this morning lasting a "few minutes"  Unsure what caused the seizure. Requesting a call to advise. Pt was laying down when seizure happen no injuries.

## 2018-08-19 NOTE — Telephone Encounter (Signed)
Spoke to his mother ([place]) to offer a follow up appt.  States she will call back next week to schedule.

## 2018-08-19 NOTE — Telephone Encounter (Signed)
The patient had yet another seizure this morning, last check on his Dilantin and Depakote were well within the therapeutic range.  I will get a revisit to see him, he does not have a visit set up at this time.

## 2018-09-07 DIAGNOSIS — Z0289 Encounter for other administrative examinations: Secondary | ICD-10-CM

## 2018-09-27 ENCOUNTER — Emergency Department (HOSPITAL_COMMUNITY)
Admission: EM | Admit: 2018-09-27 | Discharge: 2018-09-27 | Disposition: A | Discharge status: Home / Self Care | Payer: Medicaid Other | Attending: Emergency Medicine | Admitting: Emergency Medicine

## 2018-09-27 ENCOUNTER — Encounter (HOSPITAL_COMMUNITY): Payer: Self-pay | Admitting: Emergency Medicine

## 2018-09-27 ENCOUNTER — Other Ambulatory Visit: Payer: Self-pay

## 2018-09-27 DIAGNOSIS — F329 Major depressive disorder, single episode, unspecified: Secondary | ICD-10-CM | POA: Diagnosis not present

## 2018-09-27 DIAGNOSIS — G40909 Epilepsy, unspecified, not intractable, without status epilepticus: Secondary | ICD-10-CM | POA: Insufficient documentation

## 2018-09-27 DIAGNOSIS — F909 Attention-deficit hyperactivity disorder, unspecified type: Secondary | ICD-10-CM | POA: Insufficient documentation

## 2018-09-27 DIAGNOSIS — Z79899 Other long term (current) drug therapy: Secondary | ICD-10-CM | POA: Diagnosis not present

## 2018-09-27 DIAGNOSIS — R45851 Suicidal ideations: Secondary | ICD-10-CM | POA: Insufficient documentation

## 2018-09-27 DIAGNOSIS — F32A Depression, unspecified: Secondary | ICD-10-CM

## 2018-09-27 DIAGNOSIS — R456 Violent behavior: Secondary | ICD-10-CM | POA: Diagnosis present

## 2018-09-27 LAB — SALICYLATE LEVEL

## 2018-09-27 LAB — COMPREHENSIVE METABOLIC PANEL
ALK PHOS: 50 U/L (ref 38–126)
ALT: 31 U/L (ref 0–44)
ANION GAP: 9 (ref 5–15)
AST: 26 U/L (ref 15–41)
Albumin: 4.8 g/dL (ref 3.5–5.0)
BUN: 11 mg/dL (ref 6–20)
CO2: 27 mmol/L (ref 22–32)
Calcium: 9.6 mg/dL (ref 8.9–10.3)
Chloride: 101 mmol/L (ref 98–111)
Creatinine, Ser: 1.1 mg/dL (ref 0.61–1.24)
GFR calc Af Amer: >60 mL/min (ref >60)
GFR calc non Af Amer: >60 mL/min (ref >60)
Glucose, Bld: 129 mg/dL — ABNORMAL HIGH (ref 70–99)
Potassium: 4.1 mmol/L (ref 3.5–5.1)
Sodium: 137 mmol/L (ref 135–145)
Total Bilirubin: 0.4 mg/dL (ref 0.3–1.2)
Total Protein: 7.6 g/dL (ref 6.5–8.1)

## 2018-09-27 LAB — CBC
HCT: 50.1 % (ref 39.0–52.0)
Hemoglobin: 16.6 g/dL (ref 13.0–17.0)
MCH: 28.4 pg (ref 26.0–34.0)
MCHC: 33.1 g/dL (ref 30.0–36.0)
MCV: 85.6 fL (ref 80.0–100.0)
Platelets: 240 10*3/uL (ref 150–400)
RBC: 5.85 MIL/uL — AB (ref 4.22–5.81)
RDW: 13.1 % (ref 11.5–15.5)
WBC: 4.5 10*3/uL (ref 4.0–10.5)
nRBC: 0 % (ref 0.0–0.2)

## 2018-09-27 LAB — RAPID URINE DRUG SCREEN, HOSP PERFORMED
Amphetamines: NOT DETECTED
BARBITURATES: NOT DETECTED
Benzodiazepines: NOT DETECTED
Cocaine: NOT DETECTED
Opiates: NOT DETECTED
TETRAHYDROCANNABINOL: POSITIVE — AB

## 2018-09-27 LAB — VALPROIC ACID LEVEL: Valproic Acid Lvl: 19 ug/mL — ABNORMAL LOW (ref 50.0–100.0)

## 2018-09-27 LAB — ETHANOL: Alcohol, Ethyl (B): <10 mg/dL (ref <10)

## 2018-09-27 LAB — ACETAMINOPHEN LEVEL: Acetaminophen (Tylenol), Serum: <10 ug/mL — ABNORMAL LOW (ref 10–30)

## 2018-09-27 LAB — PHENYTOIN LEVEL, TOTAL: Phenytoin Lvl: 15.3 ug/mL (ref 10.0–20.0)

## 2018-09-27 MED ORDER — CITALOPRAM HYDROBROMIDE 10 MG PO TABS
10.0000 mg | ORAL_TABLET | Freq: Every day | ORAL | Status: DC
  Administered 2018-09-27: 10 mg via ORAL
  Filled 2018-09-27: qty 1

## 2018-09-27 MED ORDER — DIVALPROEX SODIUM 250 MG PO DR TAB
1000.0000 mg | DELAYED_RELEASE_TABLET | Freq: Two times a day (BID) | ORAL | Status: DC
  Administered 2018-09-27: 1000 mg via ORAL
  Filled 2018-09-27: qty 4

## 2018-09-27 MED ORDER — ALUM & MAG HYDROXIDE-SIMETH 200-200-20 MG/5ML PO SUSP: 30.0000 mL | Freq: Four times a day (QID) | ORAL | Status: DC | PRN

## 2018-09-27 MED ORDER — PHENYTOIN 50 MG PO CHEW: 25.0000 mg | CHEWABLE_TABLET | Freq: Every day | ORAL | Status: DC

## 2018-09-27 MED ORDER — ZOLPIDEM TARTRATE 5 MG PO TABS: 5.0000 mg | ORAL_TABLET | Freq: Every evening | ORAL | Status: DC | PRN

## 2018-09-27 MED ORDER — PHENYTOIN SODIUM EXTENDED 100 MG PO CAPS
300.0000 mg | ORAL_CAPSULE | Freq: Every day | ORAL | Status: DC
  Administered 2018-09-27: 300 mg via ORAL
  Filled 2018-09-27: qty 3

## 2018-09-27 NOTE — ED Provider Notes (Signed)
[facility] EMERGENCY DEPARTMENT Provider Note   CSN: [id number] Arrival date & time: 09/27/18  1132     History   Chief Complaint Chief Complaint  Patient presents with  . Medical Clearance  . Suicidal    HPI Dylan Moon is a 23 y.o. male.  The history is provided by the patient and the police. No language interpreter was used.  Mental Health Problem  Presenting symptoms: aggressive behavior and suicidal threats   Patient accompanied by:  Law enforcement Timing:  Constant Progression:  Worsening Chronicity:  New Context: noncompliance   Relieved by:  Nothing Pt here with Police.  [name] Mother completed IVC because pt has been threatening to harm himself and not taking his medications.  Pt reports he is taking his medication   Past Medical History:  Diagnosis Date  . ADHD   . Scoliosis   . Seizures (HCC)    most recent 12/02/17    Patient Active Problem List   Diagnosis Date Noted  . Seizures (HCC) 07/10/2018  . 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 Noncompliance with medication regimen 12/11/2017  . Seizure (HCC) 05/24/2016  . Dehydration, mild 05/24/2016  . AKI (acute kidney injury) (HCC) 05/24/2016  . Marijuana use 05/24/2016    Past Surgical History:  Procedure Laterality Date  . NO PAST SURGERIES          Home Medications    Prior to Admission medications   Medication Sig Start Date End Date Taking? Authorizing Provider  citalopram (CELEXA) 10 MG tablet 1 tablet daily for 2 weeks and then take 2 tablets daily Patient taking differently: Take 10-20 mg by mouth See admin instructions. Take one tablet (10 mg) by mouth daily for 2 weeks, then take 2 tablets (20 mg) daily 06/30/18   York Spaniel, MD  diclofenac (VOLTAREN) 50 MG EC tablet Take 50 mg by mouth 2 (two) times daily as needed (back pain).  03/05/18   [provider]  divalproex (DEPAKOTE) 500 MG DR tablet Two tablets  in the morning and 3 in the evening 07/13/18   York Spaniel, MD  ondansetron (ZOFRAN ODT) 8 MG disintegrating tablet Take 1 tablet (8 mg total) by mouth every 8 (eight) hours as needed for nausea or vomiting. 03/14/18   Margarita Grizzle, MD  phenytoin (DILANTIN) 100 MG ER capsule Take 3 capsules (300 mg total) by mouth at bedtime. Patient taking differently: Take 100-200 mg by mouth See admin instructions. Take 2 capsules (200 mg) by mouth every morning, take 1 capsule (100 mg) with 1/2 50 mg tablet for a total dose of 125 mg at bedtime. 01/09/18   York Spaniel, MD  phenytoin (DILANTIN) 50 MG tablet Chew 0.5 tablets (25 mg total) by mouth daily. Patient taking differently: Chew 25 mg by mouth See admin instructions. Take 1/2 tablet (25 mg) with a 100 mg capsule for a total dose of 125 mg daily at bedtime 05/17/18   York Spaniel, MD    Family History Family History  Problem Relation Age of Onset  . Diabetes Mother   . Hypertension Mother   . Cancer Other   . Diabetes Father   . Seizures Maternal Grandfather     Social History Social History   Tobacco Use  . Smoking status: Never Smoker  . Smokeless tobacco: Never Used  Substance Use Topics  . Alcohol use: No  . Drug use: Yes    Types: Marijuana  Comment: Daily use of marijuana.     Allergies   Keppra [levetiracetam]; Tramadol; and Vimpat [lacosamide]   Review of Systems Review of Systems  All other systems reviewed and are negative.    Physical Exam Updated Vital Signs BP (!) 167/88   Pulse 99   Temp 98.7 F (37.1 C) (Oral)   Resp 16   SpO2 99%   Physical Exam Vitals signs and nursing note reviewed.  Constitutional:      Appearance: He is well-developed.  HENT:     Head: Normocephalic.     Right Ear: External ear normal.     Left Ear: External ear normal.     Nose: Nose normal.  Eyes:     Pupils: Pupils are equal, round, and reactive to light.  Neck:     Musculoskeletal: Normal range of motion.   Cardiovascular:     Rate and Rhythm: Normal rate.  Pulmonary:     Effort: Pulmonary effort is normal.  Abdominal:     General: There is no distension.  Musculoskeletal: Normal range of motion.  Skin:    General: Skin is warm.  Neurological:     Mental Status: He is alert and oriented to person, place, and time.  Psychiatric:        Mood and Affect: Mood normal.      ED Treatments / Results  Labs (all labs ordered are listed, but only abnormal results are displayed) Labs Reviewed  COMPREHENSIVE METABOLIC PANEL - Abnormal; Notable for the following components:      Result Value   Glucose, Bld 129 (*)    All other components within normal limits  ACETAMINOPHEN LEVEL - Abnormal; Notable for the following components:   Acetaminophen (Tylenol), Serum <10 (*)    All other components within normal limits  CBC - Abnormal; Notable for the following components:   RBC 5.85 (*)    All other components within normal limits  ETHANOL  SALICYLATE LEVEL  RAPID URINE DRUG SCREEN, HOSP PERFORMED    EKG None  Radiology No results found.  Procedures Procedures (including critical care time)  Medications Ordered in ED Medications - No data to display   Initial Impression / Assessment and Plan / ED Course  I have reviewed the triage vital signs and the nursing notes.  Pertinent labs & imaging results that were available during my care of the patient were reviewed by me and considered in my medical decision making (see chart for details).     TTS consult pending. Home meds ordered   Final Clinical Impressions(s) / ED Diagnoses   Final diagnoses:  Suicidal ideation  Depression, unspecified depression type  Seizure disorder [facility])    ED Discharge Orders    None       Elson Areas, [place] 09/27/18 1449    Doug Sou, MD 09/27/18 [phone]

## 2018-09-27 NOTE — ED Notes (Signed)
Pt called someone on phone from phone at nurses' desk and asked for them to come pick him up. Pt aware IVC'd. Scientist, research (life sciences) w/pt.

## 2018-09-27 NOTE — ED Notes (Signed)
Pt standing in room - refusing to sit on bed or chair offered. [facility] aware pt ready for TTS. Officer standing by d/t pt threatening to leave.

## 2018-09-27 NOTE — ED Notes (Signed)
Sitter has arrived and is w/pt.

## 2018-09-27 NOTE — Discharge Instructions (Addendum)
Your blood Depakote level is low today at 19.  Ask your doctor to recheck your blood Depakote level in 1 to 2 weeks.  Your blood pressure should be rechecked within the next 3 weeks.  Today's was mildly elevated at 152/90.  If you have any thoughts of harming yourself or someone else call 911 immediately.  Call any of the numbers on the resource guide to get psychiatric help

## 2018-09-27 NOTE — ED Triage Notes (Addendum)
Pt arrives to ER in [employer] custody with IVC papers taken out by his mom, pt stataes that she keeps asking is he  Taking his meds for sz, mom told [employer] pt had said he did not want to live , pt denies

## 2018-09-27 NOTE — ED Notes (Signed)
1st Exam IVC papers completed by Dr Ethelda Chick - copy faxed to [facility], copy sent to Medical Records, original placed in folder Magistrate, and all 3 sets on clipboard.
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
## 2018-09-27 NOTE — ED Notes (Signed)
IVC paperwork rescinded - copy faxed to [employer], copy sent to Medical Records, and original placed in folder for [employer].

## 2018-09-27 NOTE — ED Notes (Signed)
ED Provider at bedside. 

## 2018-09-27 NOTE — ED Notes (Signed)
ALL belongings inventoried - 2 labeled belongings bags placed in [place] [address] and Valuables Envelope - Security.

## 2018-09-27 NOTE — ED Notes (Signed)
ALL belongings - 2 labeled belongings bags and 1 valuables envelope - returned to pt - Pt signed verifying all items present. 
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
## 2018-09-27 NOTE — ED Notes (Signed)
Pt escorted to lobby w/his mother.

## 2018-09-27 NOTE — ED Notes (Signed)
Pharm Tech entering pt's meds from info received from pharmacy.

## 2018-09-27 NOTE — ED Notes (Signed)
Pt arrived to [facility] wearing burgundy scrubs. States he is not staying and wants to leave. Advised pt he is under IVC. Pt denies SI/HI. States "I need to get home to my baby". States his baby his w/his "baby's mama". Pt noted to be restless. Pt had cell phone while in [place] - pt gave to Security who gave to RN and placed in pt's 1 labeled belongings bag. Pt noted to be irritable and smells of marijuana. Pt aware of tx plan - will be assessed by TTS.

## 2018-09-27 NOTE — ED Notes (Signed)
Pt attempted to reach his mother so [facility] Counselor can talk w/her for collateral info.

## 2018-09-28 ENCOUNTER — Telehealth: Payer: Self-pay | Admitting: Neurology

## 2018-09-28 MED ORDER — BUSPIRONE HCL 5 MG PO TABS: 5.0000 mg | ORAL_TABLET | Freq: Three times a day (TID) | ORAL | 1 refills | Status: DC

## 2018-09-28 NOTE — Telephone Encounter (Signed)
I called and talk with the mother.  The patient went to the emergency room yesterday because he was feeling depressed.  The patient had started the Celexa a week ago, he was having some agitation and anxiety.  Celexa could be a source of increased depression, they should stop the medication now.  The blood work from the hospital suggested that the patient was not taking his Depakote properly, he is taking his Dilantin.  We will start BuSpar instead of the Celexa.

## 2018-09-28 NOTE — Addendum Note (Signed)
Addended by: York SpanielWILLIS, CHARLES K on: 09/28/2018 12:59 PM   Modules accepted: Orders

## 2018-09-28 NOTE — Telephone Encounter (Signed)
Pt mother(on DPR-Jones,Beverly) is asking for a call to discuss that pt went to the hospital on yesterday and she is asking for a call from Dr Anne Hahn to discuss patients medications

## 2018-10-06 ENCOUNTER — Telehealth: Payer: Self-pay | Admitting: Neurology

## 2018-10-06 DIAGNOSIS — G40909 Epilepsy, unspecified, not intractable, without status epilepticus: Secondary | ICD-10-CM

## 2018-10-06 NOTE — Telephone Encounter (Signed)
I called the mother.  [place], I will try to make a referral to an epileptic allergist in that region.

## 2018-10-06 NOTE — Telephone Encounter (Signed)
Pt mother(on DPR-Jones,Beverly)has called to inform she and pt are moving to [place] and she is asking Dr Anne Hahn for a referral to a specialist in [place], [place].  Please call

## 2018-10-07 ENCOUNTER — Other Ambulatory Visit: Payer: Self-pay | Admitting: Neurology

## 2018-10-07 ENCOUNTER — Encounter: Payer: Self-pay | Admitting: Neurology

## 2018-10-07 ENCOUNTER — Telehealth: Payer: Self-pay | Admitting: Neurology

## 2018-10-07 MED ORDER — DIVALPROEX SODIUM 500 MG PO DR TAB: DELAYED_RELEASE_TABLET | ORAL | 3 refills | Status: DC

## 2018-10-07 NOTE — Telephone Encounter (Signed)
Pt mother(on DPR-Jones,Beverly) has called to report an hour ago pt had a seizure, please call
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
## 2018-10-07 NOTE — Addendum Note (Signed)
Addended by: York Spaniel on: 10/07/2018 09:11 AM   Modules accepted: Orders

## 2018-10-07 NOTE — Telephone Encounter (Signed)
Pt mother(on DPP-Jones,Beverly)is asking that Dr Anne Hahn be made aware that they are not moving to [facility] at this time.  Pt mother is also asking for a letter from Dr Anne Hahn stating pt is unable to work, this letter is needed for pt's probation officer.

## 2018-10-07 NOTE — Telephone Encounter (Signed)
I will make a referral to Dr. Junie Bame, telephone number is [phone].  Fax number is [phone].

## 2018-10-07 NOTE — Telephone Encounter (Signed)
I called the mother.  The patient missed the Depakote dose the night before and in the morning, had a seizure.  I will send in another prescription for the Depakote.  I have dictated a letter regarding his ability to work.

## 2018-10-07 NOTE — Telephone Encounter (Signed)
I will write the letter.

## 2018-11-05 ENCOUNTER — Telehealth: Payer: Self-pay | Admitting: Neurology

## 2018-11-05 ENCOUNTER — Encounter: Payer: Self-pay | Admitting: Neurology

## 2018-11-05 MED ORDER — BUSPIRONE HCL 10 MG PO TABS: 10.0000 mg | ORAL_TABLET | Freq: Three times a day (TID) | ORAL | 3 refills | Status: DC

## 2018-11-05 NOTE — Telephone Encounter (Signed)
Pt mother(on DPR) has called to inform that pt is incarcerated and she needs Dr Anne Hahn to call her because of information pt's lawyer is requesting.  Please call

## 2018-11-05 NOTE — Addendum Note (Signed)
Addended by: York Spaniel on: 11/05/2018 07:12 PM   Modules accepted: Orders

## 2018-11-05 NOTE — Telephone Encounter (Signed)
I called and talk with the mother.  The patient is now incarcerated.  He appears to be complaining of some dizziness, may be having some anxiety issues, we will increase the BuSpar to 10 mg 3 times daily.  The mother has requested a letter indicating that the patient has a seizure disorder and an anxiety disorder.  I will dictate a letter.  They will pick up the letter tomorrow.

## 2018-11-06 NOTE — Telephone Encounter (Signed)
Patients mom called and stated that ernest powell was going to pick up letters.

## 2018-11-06 NOTE — Telephone Encounter (Signed)
Letter placed in envelope and placed upfront at [facility].

## 2018-11-20 ENCOUNTER — Other Ambulatory Visit: Payer: Self-pay | Admitting: Neurology

## 2018-11-20 ENCOUNTER — Telehealth: Payer: Self-pay | Admitting: Neurology

## 2018-11-20 MED ORDER — BUSPIRONE HCL 5 MG PO TABS: 5.0000 mg | ORAL_TABLET | Freq: Three times a day (TID) | ORAL | 3 refills | Status: DC

## 2018-11-20 MED ORDER — PHENYTOIN 50 MG PO CHEW: 25.0000 mg | CHEWABLE_TABLET | Freq: Every day | ORAL | 5 refills | Status: DC

## 2018-11-20 MED ORDER — PHENYTOIN SODIUM EXTENDED 100 MG PO CAPS: 300.0000 mg | ORAL_CAPSULE | Freq: Every day | ORAL | 3 refills | Status: DC

## 2018-11-20 NOTE — Telephone Encounter (Signed)
A prescription was written for the 5 mg BuSpar tablets.

## 2018-11-20 NOTE — Telephone Encounter (Signed)
I contacted the pt's mother. I advised I have sent the Dilantin 100 mg & 50 mg to [facility]. I advised pt's mother the pt is due for an appt and I could schedule for her today. She states she is working on getting the [employer] active and will call back once she does to schedule. She requested in the mean time the buspar to be decreased back to 5 mg because the 10 mg is not agreeing with her son. I advised I would fwd to MD to review/advise.

## 2018-11-20 NOTE — Telephone Encounter (Signed)
[name] mother Granville Lewis is requesting refill for Buspar sent to [facility]. She is asking to decrease dose to 5mg . She is also needing phenytoin (DILANTIN) 50 MG tablet and phenytoin (DILANTIN) 100 MG ER capsule sent to [facility] as well.

## 2018-11-30 ENCOUNTER — Telehealth: Payer: Self-pay | Admitting: Neurology

## 2018-11-30 ENCOUNTER — Encounter: Payer: Self-pay | Admitting: Neurology

## 2018-11-30 NOTE — Telephone Encounter (Signed)
I called and talked to the mother.  The patient is having ongoing cognitive problems, I will dictate a letter indicating that he clearly has had some cognitive changes since a prolonged seizure in March 2019.

## 2018-11-30 NOTE — Telephone Encounter (Signed)
Pt mother(on DPR)  Has called asking for another letter from Dr Anne Hahn stating pt has brain damage and that is is due 2 seizure activity.  Please call

## 2018-12-02 NOTE — Telephone Encounter (Signed)
Letter placed upfront at [facility] for pick up.  

## 2018-12-28 ENCOUNTER — Ambulatory Visit: Payer: Self-pay | Admitting: Primary Care

## 2018-12-28 ENCOUNTER — Ambulatory Visit (INDEPENDENT_AMBULATORY_CARE_PROVIDER_SITE_OTHER): Payer: Self-pay | Admitting: Primary Care

## 2019-01-07 ENCOUNTER — Ambulatory Visit: Payer: Self-pay | Admitting: Family Medicine

## 2019-01-15 ENCOUNTER — Emergency Department (HOSPITAL_COMMUNITY): Payer: Medicaid Other

## 2019-01-15 ENCOUNTER — Other Ambulatory Visit: Payer: Self-pay

## 2019-01-15 ENCOUNTER — Telehealth: Payer: Self-pay | Admitting: Neurology

## 2019-01-15 ENCOUNTER — Emergency Department (HOSPITAL_COMMUNITY)
Admission: EM | Admit: 2019-01-15 | Discharge: 2019-01-15 | Disposition: A | Discharge status: Home / Self Care | Payer: Medicaid Other | Attending: Emergency Medicine | Admitting: Emergency Medicine

## 2019-01-15 DIAGNOSIS — R569 Unspecified convulsions: Secondary | ICD-10-CM | POA: Insufficient documentation

## 2019-01-15 DIAGNOSIS — Z91128 Patient's intentional underdosing of medication regimen for other reason: Secondary | ICD-10-CM | POA: Diagnosis not present

## 2019-01-15 DIAGNOSIS — Z79899 Other long term (current) drug therapy: Secondary | ICD-10-CM | POA: Diagnosis not present

## 2019-01-15 DIAGNOSIS — R51 Headache: Secondary | ICD-10-CM | POA: Diagnosis not present

## 2019-01-15 DIAGNOSIS — F1290 Cannabis use, unspecified, uncomplicated: Secondary | ICD-10-CM | POA: Insufficient documentation

## 2019-01-15 DIAGNOSIS — T420X6A Underdosing of hydantoin derivatives, initial encounter: Secondary | ICD-10-CM | POA: Insufficient documentation

## 2019-01-15 DIAGNOSIS — Z5320 Procedure and treatment not carried out because of patient's decision for unspecified reasons: Secondary | ICD-10-CM | POA: Diagnosis not present

## 2019-01-15 LAB — CBC WITH DIFFERENTIAL/PLATELET
Abs Immature Granulocytes: 0.05 10*3/uL (ref 0.00–0.07)
Basophils Absolute: 0 10*3/uL (ref 0.0–0.1)
Basophils Relative: 0 %
Eosinophils Absolute: 0 10*3/uL (ref 0.0–0.5)
Eosinophils Relative: 0 %
HCT: 50.1 % (ref 39.0–52.0)
Hemoglobin: 16.7 g/dL (ref 13.0–17.0)
Immature Granulocytes: 0 %
Lymphocytes Relative: 3 %
Lymphs Abs: 0.5 10*3/uL — ABNORMAL LOW (ref 0.7–4.0)
MCH: 28.5 pg (ref 26.0–34.0)
MCHC: 33.3 g/dL (ref 30.0–36.0)
MCV: 85.5 fL (ref 80.0–100.0)
Monocytes Absolute: 0.6 10*3/uL (ref 0.1–1.0)
Monocytes Relative: 4 %
Neutro Abs: 14 10*3/uL — ABNORMAL HIGH (ref 1.7–7.7)
Neutrophils Relative %: 93 %
Platelets: 223 10*3/uL (ref 150–400)
RBC: 5.86 MIL/uL — ABNORMAL HIGH (ref 4.22–5.81)
RDW: 12.6 % (ref 11.5–15.5)
WBC: 15.1 10*3/uL — ABNORMAL HIGH (ref 4.0–10.5)
nRBC: 0 % (ref 0.0–0.2)

## 2019-01-15 LAB — VALPROIC ACID LEVEL: Valproic Acid Lvl: 46 ug/mL — ABNORMAL LOW (ref 50.0–100.0)

## 2019-01-15 LAB — PHENYTOIN LEVEL, TOTAL: Phenytoin Lvl: <2.5 ug/mL — ABNORMAL LOW (ref 10.0–20.0)

## 2019-01-15 LAB — BASIC METABOLIC PANEL
Anion gap: 13 (ref 5–15)
BUN: 10 mg/dL (ref 6–20)
CO2: 22 mmol/L (ref 22–32)
Calcium: 9.9 mg/dL (ref 8.9–10.3)
Chloride: 100 mmol/L (ref 98–111)
Creatinine, Ser: 1.26 mg/dL — ABNORMAL HIGH (ref 0.61–1.24)
GFR calc Af Amer: >60 mL/min (ref >60)
GFR calc non Af Amer: >60 mL/min (ref >60)
Glucose, Bld: 106 mg/dL — ABNORMAL HIGH (ref 70–99)
Potassium: 4.4 mmol/L (ref 3.5–5.1)
Sodium: 135 mmol/L (ref 135–145)

## 2019-01-15 LAB — ETHANOL: Alcohol, Ethyl (B): <10 mg/dL (ref <10)

## 2019-01-15 MED ORDER — KETOROLAC TROMETHAMINE 30 MG/ML IJ SOLN: 30.0000 mg | Freq: Once | INTRAMUSCULAR | Status: DC

## 2019-01-15 MED ORDER — SODIUM CHLORIDE 0.9 % IV BOLUS
1000.0000 mL | Freq: Once | INTRAVENOUS | Status: AC
  Administered 2019-01-15: 1000 mL via INTRAVENOUS

## 2019-01-15 MED ORDER — DIPHENHYDRAMINE HCL 50 MG/ML IJ SOLN
25.0000 mg | Freq: Once | INTRAMUSCULAR | Status: AC
  Administered 2019-01-15: 15:00:00 25 mg via INTRAVENOUS
  Filled 2019-01-15: qty 1

## 2019-01-15 MED ORDER — SODIUM CHLORIDE 0.9 % IV SOLN
1500.0000 mg | Freq: Once | INTRAVENOUS | Status: DC
  Filled 2019-01-15: qty 30

## 2019-01-15 MED ORDER — METOCLOPRAMIDE HCL 5 MG/ML IJ SOLN
10.0000 mg | Freq: Once | INTRAMUSCULAR | Status: AC
  Administered 2019-01-15: 15:00:00 10 mg via INTRAVENOUS
  Filled 2019-01-15: qty 2

## 2019-01-15 NOTE — ED Notes (Signed)
AMA signatures obtained.

## 2019-01-15 NOTE — ED Triage Notes (Signed)
Pt brought in by ems for c/o multiple seizures per family today ; according to family , patient has been " adjusting his own doses " pt has hx of seizures and is onphenytoin and divalproex; pt alert and oriented x 4 and following simple commands ; pt c/o headache ; neuro intact

## 2019-01-15 NOTE — Telephone Encounter (Signed)
I called mother.  The patient apparently had several small seizures yesterday and up to 3 seizures today, he will be going to the emergency room.  In the past, he has sometimes been noncompliant with his medications, but he does have an intractable seizure disorder.  Going to the emergency room is appropriate.  The mother is concerned that the patient oftentimes has significant agitation and anxiety following a seizure.  She is concerned about his reaction when he wakes up in the emergency room.  The patient is not been seen through our office since August 2019.  He does not have medical insurance currently, the mother is trying to get him insurance.  He is now out of jail, back home.  The patient does have medications at home.

## 2019-01-15 NOTE — ED Notes (Signed)
Pt left [facility] and left AMA. He was strongly and repeatedly encouraged to stay in the hospital for tx and administration of Cerebyx but he stated he had to leave. He removed his own IV, signed AMA paperwork, and left the hospital. He was encouraged to come back to the hospital, and to take specific precautions for future seizures.

## 2019-01-15 NOTE — Telephone Encounter (Signed)
I called the mother again.  The patient went to the emergency room, his Dilantin and Depakote levels were low, he likely has not been taking his medications properly.  He smoked marijuana last evening, probably did not take his medications.  Depakote level is low, Dilantin level is basically 0.

## 2019-01-15 NOTE — ED Provider Notes (Signed)
[facility] EMERGENCY DEPARTMENT Provider Note   CSN: [id number] Arrival date & time: 01/15/19  1356    History   Chief Complaint Chief Complaint  Patient presents with  . Seizures    HPI Dylan Moon is a 23 y.o. male with history of ADHD, seizures, adjustment disorder, noncompliance with medication regimen, marijuana use presents for evaluation of seizures today.  He is brought in by EMS who report that he had multiple seizures today witnessed by his sister.  Triage note reports that he has been "adjusting his own doses".  He is currently complaining of a frontal headache which is "sharp" in nature and does not radiate.  He reports it feels similar to prior headaches he has had in the past.  He states that he woke up with this headache this morning prior to his seizures.  He denies vision changes, nausea, vomiting, chest pain, shortness of breath, abdominal pain, numbness, tingling, weakness, slurred speech, or facial droop.   He denies cigarette smoking or alcohol use but reports he last smoked marijuana last night.  When asked about his medication regimen he states "just look at the pills over there". He is uncooperative with my examination and states "I just need to sleep", but then asks me for something for his headache and a Sprite.   Chart review shows telephone encounter to the patient's neurologist earlier today which notes that the patient has apparently had been having several small seizures yesterday and up to 3 seizures today.  The note makes mention that the patient has been historically noncompliant with his medications but does have an intractable seizure disorder.  He has not been seen by his neurologist in the office since August 2019.     The history is provided by the patient.    Past Medical History:  Diagnosis Date  . ADHD   . Scoliosis   . Seizures (HCC)    most recent 12/02/17    Patient Active Problem List   Diagnosis Date Noted  .  Seizures (HCC) 07/10/2018  . 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 Noncompliance with medication regimen 12/11/2017  . Seizure (HCC) 05/24/2016  . Dehydration, mild 05/24/2016  . AKI (acute kidney injury) (HCC) 05/24/2016  . Marijuana use 05/24/2016    Past Surgical History:  Procedure Laterality Date  . NO PAST SURGERIES          Home Medications    Prior to Admission medications   Medication Sig Start Date End Date Taking? Authorizing Provider  busPIRone (BUSPAR) 5 MG tablet Take 1 tablet (5 mg total) by mouth 3 (three) times daily. 11/20/18   York Spaniel, MD  divalproex (DEPAKOTE) 500 MG DR tablet Two tablets in the morning and 3 in the evening 10/07/18   York Spaniel, MD  phenytoin (DILANTIN) 100 MG ER capsule TAKE 3 CAPSULES BY MOUTH AT BEDTIME. 11/20/18   York Spaniel, MD  phenytoin (DILANTIN) 100 MG ER capsule Take 3 capsules (300 mg total) by mouth at bedtime. 11/20/18   York Spaniel, MD  phenytoin (DILANTIN) 50 MG tablet Chew 0.5 tablets (25 mg total) by mouth daily. 11/20/18   York Spaniel, MD    Family History Family History  Problem Relation Age of Onset  . Diabetes Mother   . Hypertension Mother   . Cancer Other   . Diabetes Father   . Seizures Maternal Grandfather     Social History Social  History   Tobacco Use  . Smoking status: Never Smoker  . Smokeless tobacco: Never Used  Substance Use Topics  . Alcohol use: No  . Drug use: Yes    Types: Marijuana    Comment: Daily use of marijuana.     Allergies   Keppra [levetiracetam]; Tramadol; and Vimpat [lacosamide]   Review of Systems Review of Systems  Constitutional: Negative for chills and fever.  Eyes: Negative for photophobia and visual disturbance.  Respiratory: Negative for shortness of breath.   Cardiovascular: Negative for chest pain.  Gastrointestinal: Negative for abdominal pain, nausea and vomiting.  Neurological:  Positive for seizures and headaches.     Physical Exam Updated Vital Signs BP 139/69 (BP Location: Right Arm)   Pulse 95   Temp 97.8 F (36.6 C) (Oral)   Resp (!) 29   Ht  (1.803 m)   Wt 81.6 kg   SpO2 99%   BMI 25.10 kg/m   Physical Exam Vitals signs and nursing note reviewed.  Constitutional:      General: He is not in acute distress.    Appearance: He is well-developed.  HENT:     Head: Normocephalic and atraumatic.  Eyes:     General:        Right eye: No discharge.        Left eye: No discharge.     Extraocular Movements: Extraocular movements intact.     Conjunctiva/sclera: Conjunctivae normal.     Pupils: Pupils are equal, round, and reactive to light.  Neck:     Musculoskeletal: Normal range of motion and neck supple. No neck rigidity.     Vascular: No JVD.     Trachea: No tracheal deviation.  Cardiovascular:     Rate and Rhythm: Normal rate.  Pulmonary:     Effort: Pulmonary effort is normal.  Abdominal:     General: Abdomen is flat. There is no distension.     Palpations: Abdomen is soft.     Tenderness: There is no abdominal tenderness. There is no guarding or rebound.  Musculoskeletal:        General: No tenderness or deformity.  Skin:    General: Skin is warm and dry.     Findings: No erythema.  Neurological:     Mental Status: He is alert.     Comments: No evidence of aphasia.  No facial droop.  Cranial nerves II through XII appear grossly intact.  No pronator drift.  Patient is able to move his extremities spontaneously but quickly becomes exasperated with examination, verbalizes extremities and agitatedly states "I just need to sleep". He is uncooperative with the remainder of my examination.   Psychiatric:        Mood and Affect: Affect is angry.        Behavior: Behavior is uncooperative.      ED Treatments / Results  Labs (all labs ordered are listed, but only abnormal results are displayed) Labs Reviewed  CBC WITH  DIFFERENTIAL/PLATELET - Abnormal; Notable for the following components:      Result Value   WBC 15.1 (*)    RBC 5.86 (*)    Neutro Abs 14.0 (*)    Lymphs Abs 0.5 (*)    All other components within normal limits  BASIC METABOLIC PANEL  RAPID URINE DRUG SCREEN, HOSP PERFORMED  PHENYTOIN LEVEL, TOTAL  VALPROIC ACID LEVEL  ETHANOL    EKG EKG Interpretation  Date/Time:  Friday January 15 2019 14:03:21 EDT Ventricular Rate:  95 PR Interval:    QRS Duration: 75 QT Interval:  318 QTC Calculation: 400 R Axis:   86 Text Interpretation:  Sinus rhythm Consider left atrial enlargement No significant change since last tracing Confirmed by Richardean CanalYao, David H [phone]([id number]) on 01/15/2019 2:57:09 PM   Radiology No results found.  Procedures Procedures (including critical care time)  Medications Ordered in ED Medications  sodium chloride 0.9 % bolus 1,000 mL (1,000 mLs Intravenous New Bag/Given 01/15/19 1521)  metoCLOPramide (REGLAN) injection 10 mg (10 mg Intravenous Given 01/15/19 1523)  diphenhydrAMINE (BENADRYL) injection 25 mg (25 mg Intravenous Given 01/15/19 1522)     Initial Impression / Assessment and Plan / ED Course  I have reviewed the triage vital signs and the nursing notes.  Pertinent labs & imaging results that were available during my care of the patient were reviewed by me and considered in my medical decision making (see chart for details).        Patient with seizure disorder presenting with seizure-like activity is afebrile, stable.  Nontoxic in appearance.  Clinical picture is complicated due to history of medication noncompliance and marijuana use.  He is noncompliant with my neuro examination but no focal neurologic deficits noted.  No fever or meningeal signs to suggest meningitis.  Will obtain lab work, head CT, give headache cocktail and reassess.  3:48 PM Signed out to oncoming provider PA here.  Awaiting remainder of lab work and head CT.  Lab work thus far reviewed  initiated leukocytosis which could be reactive secondary to seizures.  EKG shows normal sinus rhythm, no acute ischemic changes.  Patient resting comfortably apparent distress.  Seizure-like activity ED. We are also checking phenytoin and Depakote levels.  Require consultation to neurology for possible medication management for further assessment.  If he has any seizures while in the ED he may require admission for intractable seizure disorder. Low suspicion CVA, ICH, SAH, or meningitis. Discussed with Dr. Silverio LayYao who agrees with assessment and plan at this time.   Final Clinical Impressions(s) / ED Diagnoses   Final diagnoses:  Seizures [facility](HCC)    ED Discharge Orders    None       Bennye AlmFawze, Dillan Candela A, PA-C 01/15/19 1551    Charlynne PanderYao, David Hsienta, MD 01/20/19 2029

## 2019-01-15 NOTE — ED Provider Notes (Signed)
3:42 PM BP 139/69 (BP Location: Right Arm)   Pulse 95   Temp 97.8 F (36.6 C) (Oral)   Resp (!) 29   Ht 5\' 11"  (1.803 m)   Wt 81.6 kg   SpO2 99%   BMI 25.10 kg/m  Patient  With hx of epiliepsy- patient recently incarcerated. Patient had multiple witnessed seizures over the past 24 hours. Patient has been somewhat noncompliant with examination today.  Likely dc with med adjustments?  Patient left AMA prior to meds or med adjustments   Arthor Captain, PA-C 01/16/19 2308    Tegeler, Canary Brim, MD 01/16/19 [phone]

## 2019-01-16 ENCOUNTER — Other Ambulatory Visit: Payer: Self-pay | Admitting: Neurology

## 2019-01-18 ENCOUNTER — Telehealth: Payer: Self-pay | Admitting: Neurology

## 2019-01-18 NOTE — Telephone Encounter (Signed)
I called the mother.  The patient has a prescription for Dilantin taking 3 at night, okay for him to take 1 in the morning and 2 in the evening with the 50 mg Dilantin tablet, the problem is that the patient does not take the medication, his last level in the ER was 0.  He is on Depakote, this has helped as a mood stabilizer.  The patient has been referred to behavioral health but they apparently are not really actively treating him.  The mother is concerned that he is acting erratically, she is afraid that he will end up getting arrested and incarcerated again.  We need to see him in the office at some point in the future.  Currently, the mother is in [place], she claimed that she will be coming back to [place] in May.

## 2019-01-18 NOTE — Telephone Encounter (Signed)
[name] mother has questions about the pt's medication's phenytoin (DILANTIN) 100 MG ER capsule and the divalproex (DEPAKOTE) 500 MG DR tablet  Please advise.

## 2019-01-18 NOTE — Telephone Encounter (Addendum)
I contacted the pt's mom back ( ok per dpr). I attempted to discuss her questions in regards to the Dilantin and Depakote but she requested Dr. Anne Hahn call to discuss directly. She states the best call back # is [phone].

## 2019-02-11 ENCOUNTER — Telehealth: Payer: Self-pay | Admitting: Neurology

## 2019-02-11 MED ORDER — BUSPIRONE HCL 5 MG PO TABS: 5.0000 mg | ORAL_TABLET | Freq: Three times a day (TID) | ORAL | 3 refills | Status: DC

## 2019-02-11 NOTE — Telephone Encounter (Signed)
Chart reviewed and refill is appropriate. Rx has been submitted.  

## 2019-02-11 NOTE — Addendum Note (Signed)
Addended by: Ann Maki T on: 02/11/2019 10:17 AM   Modules accepted: Orders

## 2019-02-11 NOTE — Telephone Encounter (Signed)
RX was orginally [facility] [address]. I have corrected and [facility] [address]. [facility] [address] notified to cancel e-script for Buspar 5 mg.

## 2019-02-11 NOTE — Telephone Encounter (Signed)
Pt mother is asking for a refill on his busPIRone (BUSPAR) 5 MG tablet [id number] [place] - [address]

## 2019-02-12 ENCOUNTER — Emergency Department (HOSPITAL_COMMUNITY): Admission: EM | Admit: 2019-02-12 | Payer: MEDICAID | Source: Home / Self Care

## 2019-02-12 ENCOUNTER — Telehealth: Payer: Self-pay | Admitting: Diagnostic Neuroimaging

## 2019-02-12 NOTE — Telephone Encounter (Signed)
Patient's mother called about patient having another seizure. I called them back but no answer. It appears that they are in the ER now. -VRP

## 2019-02-16 ENCOUNTER — Ambulatory Visit: Payer: Medicaid Other | Admitting: Primary Care

## 2019-02-21 ENCOUNTER — Other Ambulatory Visit: Payer: Self-pay | Admitting: Neurology

## 2019-04-23 ENCOUNTER — Other Ambulatory Visit: Payer: Self-pay

## 2019-04-23 ENCOUNTER — Other Ambulatory Visit: Payer: Self-pay | Admitting: Nurse Practitioner

## 2019-04-23 ENCOUNTER — Ambulatory Visit: Payer: Self-pay | Attending: Nurse Practitioner | Admitting: Nurse Practitioner

## 2019-04-23 ENCOUNTER — Encounter: Payer: Self-pay | Admitting: Nurse Practitioner

## 2019-04-23 DIAGNOSIS — R569 Unspecified convulsions: Secondary | ICD-10-CM

## 2019-04-23 DIAGNOSIS — F4325 Adjustment disorder with mixed disturbance of emotions and conduct: Secondary | ICD-10-CM

## 2019-04-23 DIAGNOSIS — Z9114 Patient's other noncompliance with medication regimen: Secondary | ICD-10-CM

## 2019-04-23 MED ORDER — BUSPIRONE HCL 5 MG PO TABS: 5.0000 mg | ORAL_TABLET | Freq: Three times a day (TID) | ORAL | 3 refills | Status: DC

## 2019-04-23 MED ORDER — DIVALPROEX SODIUM ER 500 MG PO TB24: 3000.0000 mg | ORAL_TABLET | Freq: Every day | ORAL | 3 refills | Status: DC

## 2019-04-23 MED ORDER — DIVALPROEX SODIUM 500 MG PO DR TAB: 3000.0000 mg | DELAYED_RELEASE_TABLET | Freq: Every day | ORAL | 3 refills | Status: DC

## 2019-04-23 MED FILL — busPIRone HCL 5 MG TABS: 5 | 30 days supply | Qty: 90 | Fill #0

## 2019-04-23 NOTE — Progress Notes (Signed)
Virtual Visit via Telephone Note Due to national recommendations of social distancing due to COVID 19, telehealth visit is felt to be most appropriate for this patient at this time.  I discussed the limitations, risks, security and privacy concerns of performing an evaluation and management service by telephone and the availability of in person appointments. I also discussed with the patient that there may be a patient responsible charge related to this service. The patient expressed understanding and agreed to proceed.    I connected with Dylan Moon on 04/23/19  at   3:30 PM EDT EDT by telephone and verified that I am speaking with the correct person using two identifiers.   Consent I discussed the limitations, risks, security and privacy concerns of performing an evaluation and management service by telephone and the availability of in person appointments. I also discussed with the patient that there may be a patient responsible charge related to this service. The patient expressed understanding and agreed to proceed.   Location of Patient: Private Residence    [facility]   has a past medical history of ADHD, Scoliosis, and Seizures (04-2016).   Persons participating in Telemedicine visit: Dylan Moon    History of Present Illness: Telemedicine visit for: Establish care  He has a history of seizures and noncompliance with medication adherence taking Dilantan. He was taking Depakote DR for mood lability however this was recently switched to Depakote ER for his seizures by a new Neurologist.  He smokes marijuana and levels for depakote and dilantin in the past have been as low as 0.   He was previously seeing Dr. Anne HahnWillis and now seeing [facility] Baldo Daubobles [facility]. Today patient's mother is also on the call talking over the patient at times and answering for him when attempting to assess  PHQ9 and other social history questions. There was some confusion regarding the patient's medications as he read off one of the labels for his medications as dilantin which was discontinued by his neurologist around 03-28-2001. I have clarified with the on call [facility] attending that patient should be only taking Depakote ER 3000 mg nightly.  Mother states medications are somewhat costly so will transfer here to the community clinic. She has also been instructed to pick up a financial packet from the front office.   He denies any recent seizure activity.    Past Medical History:  Diagnosis Date  . ADHD   . Scoliosis   . Seizures (HCC)    most recent 12/02/17    Past Surgical History:  Procedure Laterality Date  . NO PAST SURGERIES      Family History  Problem Relation Age of Onset  . Diabetes Mother   . Hypertension Mother   . Cancer Other   . Diabetes Father   . Seizures Maternal Grandfather     Social History   Socioeconomic History  . Marital status: Single    Spouse name: Not on file  . Number of children: 0  . Years of education: HS  . Highest education level: Not on file  Occupational History  . Occupation: Arboriculturisthotographer/Director of videos  Social Needs  . Financial resource strain: Not on file  . Food insecurity    Worry: Not on file    Inability: Not on file  . Transportation needs    Medical: Not on file    Non-medical: Not on file  Tobacco Use  .  Smoking status: Never Smoker  . Smokeless tobacco: Never Used  Substance and Sexual Activity  . Alcohol use: No  . Drug use: Not Currently    Types: Marijuana    Comment: Daily use of marijuana.  . Sexual activity: Not Currently  Lifestyle  . Physical activity    Days per week: Not on file    Minutes per session: Not on file  . Stress: Not on file  Relationships  . Social Herbalist on phone: Not on file    Gets together: Not on file    Attends religious service: Not on file    Active member of club  or organization: Not on file    Attends meetings of clubs or organizations: Not on file    Relationship status: Not on file  Other Topics Concern  . Not on file  Social History Narrative   Lives at home his mother.   3-4 sodas per week.   Right-handed.     Observations/Objective: Awake, alert and oriented x 3   Review of Systems  Constitutional: Negative for fever, malaise/fatigue and weight loss.  HENT: Negative.  Negative for nosebleeds.   Eyes: Negative.  Negative for blurred vision, double vision and photophobia.  Respiratory: Negative.  Negative for cough and shortness of breath.   Cardiovascular: Negative.  Negative for chest pain, palpitations and leg swelling.  Gastrointestinal: Negative.  Negative for heartburn, nausea and vomiting.  Musculoskeletal: Negative.  Negative for myalgias.  Neurological: Positive for seizures (controlled at this time). Negative for dizziness, focal weakness and headaches.  Psychiatric/Behavioral: Negative.  Negative for suicidal ideas.    Assessment and Plan: [name] was seen today for new patient (initial visit).  Diagnoses and all orders for this visit:  Seizure ([place]) Continue Depakote ER 3000 mg (6 tablets) nightly  Noncompliance with medication regimen Discussed with patient the importance of taking his medications as prescribed as well as complications of poorly controlled seizures including death.  Adjustment disorder with mixed disturbance of emotions and conduct -     busPIRone (BUSPAR) 5 MG tablet; Take 1 tablet (5 mg total) by mouth 3 (three) times daily. Patient will pick up scripts today.      Follow Up Instructions Return in about 3 months (around 07/24/2019).     I discussed the assessment and treatment plan with the patient. The patient was provided an opportunity to ask questions and all were answered. The patient agreed with the plan and demonstrated an understanding of the instructions.   The patient was advised to  call back or seek an in-person evaluation if the symptoms worsen or if the condition fails to improve as anticipated.  I provided 26 minutes of non-face-to-face time during this encounter including median intraservice time, reviewing previous notes, labs, imaging, medications and explaining diagnosis and management.  Gildardo Pounds, FNP-BC

## 2019-04-26 MED FILL — DIVALPROEX SOD ER 500 MG TA: 500 | 30 days supply | Qty: 180 | Fill #0

## 2019-05-15 ENCOUNTER — Ambulatory Visit (HOSPITAL_COMMUNITY)
Admission: EM | Admit: 2019-05-15 | Discharge: 2019-05-15 | Disposition: A | Discharge status: Home / Self Care | Payer: Medicaid Other | Attending: Internal Medicine | Admitting: Internal Medicine

## 2019-05-15 ENCOUNTER — Other Ambulatory Visit: Payer: Self-pay

## 2019-05-15 DIAGNOSIS — Z202 Contact with and (suspected) exposure to infections with a predominantly sexual mode of transmission: Secondary | ICD-10-CM | POA: Diagnosis not present

## 2019-05-15 DIAGNOSIS — A64 Unspecified sexually transmitted disease: Secondary | ICD-10-CM

## 2019-05-15 MED ORDER — CEFTRIAXONE SODIUM 250 MG IJ SOLR
250.0000 mg | Freq: Once | INTRAMUSCULAR | Status: AC
  Administered 2019-05-15: 250 mg via INTRAMUSCULAR

## 2019-05-15 MED ORDER — CEFTRIAXONE SODIUM 250 MG IJ SOLR
INTRAMUSCULAR | Status: AC
  Filled 2019-05-15: qty 250

## 2019-05-15 MED ORDER — AZITHROMYCIN 250 MG PO TABS
1000.0000 mg | ORAL_TABLET | Freq: Once | ORAL | Status: AC
  Administered 2019-05-15: 1000 mg via ORAL

## 2019-05-15 MED ORDER — AZITHROMYCIN 250 MG PO TABS
ORAL_TABLET | ORAL | Status: AC
  Filled 2019-05-15: qty 4

## 2019-05-15 NOTE — ED Triage Notes (Signed)
Patient would like STD testing, no symptoms or exposure that he is aware of.

## 2019-05-15 NOTE — ED Provider Notes (Signed)
[place]    CSN: [id number] Arrival date & time: 05/15/19  1127      History   Chief Complaint Chief Complaint  Patient presents with  . SEXUALLY TRANSMITTED DISEASE    HPI Dylan Moon is a 23 y.o. male with no past medical history comes to urgent care requesting STD screening.  Patient has at least 3 sexual partners that he is involved with.  He has been having unprotected sex with all of them.  1 of his partners told him this morning that she has been diagnosed with gonorrhea.  Patient denies any urethral discharge dysuria urgency or frequency.  No groin pain no genital pain.  No fever or chills. HPI  Past Medical History:  Diagnosis Date  . ADHD   . Scoliosis   . Seizures ([place])    most recent 12/02/17    Patient Active Problem List   Diagnosis Date Noted  . Seizures ([place]) 07/10/2018  . 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 Noncompliance with medication regimen 12/11/2017  . Seizure ([place]) 05/24/2016  . Dehydration, mild 05/24/2016  . AKI (acute kidney injury) ([place]) 05/24/2016  . Marijuana use 05/24/2016    Past Surgical History:  Procedure Laterality Date  . NO PAST SURGERIES         Home Medications    Prior to Admission medications   Medication Sig Start Date End Date Taking? Authorizing Provider  busPIRone (BUSPAR) 5 MG tablet Take 1 tablet (5 mg total) by mouth 3 (three) times daily. Patient will pick up scripts today. 04/23/19   Gildardo Pounds, NP  divalproex (DEPAKOTE ER) 500 MG 24 hr tablet Take 6 tablets (3,000 mg total) by mouth at bedtime. 04/23/19 05/23/19  Gildardo Pounds, NP    Family History Family History  Problem Relation Age of Onset  . Diabetes Mother   . Hypertension Mother   . Cancer Other   . Diabetes Father   . Seizures Maternal Grandfather     Social History Social History   Tobacco Use  . Smoking status: Never Smoker  . Smokeless tobacco: Never Used  Substance  Use Topics  . Alcohol use: No  . Drug use: Yes    Types: Marijuana    Comment: Daily use of marijuana.     Allergies   Keppra [levetiracetam], Tramadol, and Vimpat [lacosamide]   Review of Systems Review of Systems  Constitutional: Negative.   Respiratory: Negative.   Cardiovascular: Negative.   Gastrointestinal: Negative.   Genitourinary: Negative.  Negative for discharge, dysuria, frequency, genital sores, penile pain, penile swelling, scrotal swelling, testicular pain and urgency.  Musculoskeletal: Negative.      Physical Exam Triage Vital Signs ED Triage Vitals [05/15/19 1136]  Enc Vitals Group     BP 121/86     Pulse Rate 68     Resp 16     Temp 97.8 F (36.6 C)     Temp Source Oral     SpO2 97 %     Weight      Height      Head Circumference      Peak Flow      Pain Score 0     Pain Loc      Pain Edu?      Excl. in [place]?    No data found.  Updated Vital Signs BP 121/86 (BP Location: Left Arm)   Pulse 68   Temp 97.8 F (36.6 C) (  Oral)   Resp 16   SpO2 97%   Visual Acuity Right Eye Distance:   Left Eye Distance:   Bilateral Distance:    Right Eye Near:   Left Eye Near:    Bilateral Near:     Physical Exam Vitals signs and nursing note reviewed.  Constitutional:      Appearance: He is not ill-appearing or toxic-appearing.  Cardiovascular:     Pulses: Normal pulses.     Heart sounds: Normal heart sounds.  Pulmonary:     Effort: Pulmonary effort is normal. No respiratory distress.     Breath sounds: Normal breath sounds. No rhonchi or rales.  Genitourinary:    Penis: Normal.      Scrotum/Testes: Normal.  Musculoskeletal: Normal range of motion.  Skin:    Capillary Refill: Capillary refill takes less than 2 seconds.  Neurological:     General: No focal deficit present.     Mental Status: He is alert and oriented to person, place, and time.      UC Treatments / Results  Labs (all labs ordered are listed, but only abnormal results are  displayed) Labs Reviewed  HIV ANTIBODY (ROUTINE TESTING W REFLEX)  RPR  CYTOLOGY, (ORAL, ANAL, URETHRAL) ANCILLARY ONLY    EKG   Radiology No results found.  Procedures Procedures (including critical care time)  Medications Ordered in UC Medications  cefTRIAXone (ROCEPHIN) injection 250 mg (has no administration in time range)  azithromycin (ZITHROMAX) tablet 1,000 mg (has no administration in time range)    Initial Impression / Assessment and Plan / UC Course  I have reviewed the triage vital signs and the nursing notes.  Pertinent labs & imaging results that were available during my care of the patient were reviewed by me and considered in my medical decision making (see chart for details).    1.  Exposure to gonorrhea: Ceftriaxone 250 mg IM x1 Azithromycin 1 g x 1 Urethral swab for GC/chlamydia/trichomonas HIV, RPR Safe sex counseling given to the patient Patient is advised to abstain from sexual intercourse for 7 days If patient has any more concerns or symptoms he is welcome to return to urgent care to be evaluated.  If his laboratory evaluation is remarkable, we will call him and inform him of the next steps. Final Clinical Impressions(s) / UC Diagnoses   Final diagnoses:  STD (male)   Discharge Instructions   None    ED Prescriptions    None     Controlled Substance Prescriptions [place] Controlled Substance Registry consulted? No   Merrilee JanskyLamptey, Claus Silvestro O, MD 05/15/19 1356

## 2019-05-16 LAB — HIV ANTIBODY (ROUTINE TESTING W REFLEX): HIV Screen 4th Generation wRfx: NONREACTIVE

## 2019-05-16 LAB — RPR: RPR Ser Ql: NONREACTIVE

## 2019-05-18 LAB — CYTOLOGY, (ORAL, ANAL, URETHRAL) ANCILLARY ONLY
Chlamydia: POSITIVE — AB
Neisseria Gonorrhea: NEGATIVE

## 2019-05-19 ENCOUNTER — Telehealth (HOSPITAL_COMMUNITY): Payer: Self-pay | Admitting: Emergency Medicine

## 2019-05-19 NOTE — Telephone Encounter (Signed)
Chlamydia is positive.  This was treated at the urgent care visit with po zithromax 1g.  Pt needs education to please refrain from sexual intercourse for 7 days to give the medicine time to work.  Sexual partners need to be notified and tested/treated.  Condoms may reduce risk of reinfection.  Recheck or followup with PCP for further evaluation if symptoms are not improving.  [facility] notified.  Patient contacted and made aware of    results, all questions answered   

## 2019-06-21 ENCOUNTER — Telehealth: Payer: Self-pay | Admitting: Neurology

## 2019-06-21 ENCOUNTER — Ambulatory Visit (HOSPITAL_COMMUNITY): Admission: RE | Admit: 2019-06-21 | Payer: Medicaid Other | Source: Ambulatory Visit

## 2019-06-21 ENCOUNTER — Encounter (HOSPITAL_COMMUNITY): Payer: Self-pay | Admitting: Family Medicine

## 2019-06-21 ENCOUNTER — Other Ambulatory Visit: Payer: Self-pay

## 2019-06-21 ENCOUNTER — Emergency Department (HOSPITAL_COMMUNITY)
Admission: EM | Admit: 2019-06-21 | Discharge: 2019-06-21 | Discharge status: Against Medical Advice | Payer: Medicaid Other | Attending: Emergency Medicine | Admitting: Emergency Medicine

## 2019-06-21 DIAGNOSIS — Z79899 Other long term (current) drug therapy: Secondary | ICD-10-CM | POA: Diagnosis not present

## 2019-06-21 DIAGNOSIS — R569 Unspecified convulsions: Secondary | ICD-10-CM | POA: Insufficient documentation

## 2019-06-21 LAB — BASIC METABOLIC PANEL
Anion gap: 14 (ref 5–15)
BUN: 11 mg/dL (ref 6–20)
CO2: 22 mmol/L (ref 22–32)
Calcium: 9.3 mg/dL (ref 8.9–10.3)
Chloride: 99 mmol/L (ref 98–111)
Creatinine, Ser: 0.94 mg/dL (ref 0.61–1.24)
GFR calc Af Amer: >60 mL/min (ref >60)
GFR calc non Af Amer: >60 mL/min (ref >60)
Glucose, Bld: 103 mg/dL — ABNORMAL HIGH (ref 70–99)
Potassium: 4.5 mmol/L (ref 3.5–5.1)
Sodium: 135 mmol/L (ref 135–145)

## 2019-06-21 LAB — CBC WITH DIFFERENTIAL/PLATELET
Abs Immature Granulocytes: 0.02 10*3/uL (ref 0.00–0.07)
Basophils Absolute: 0 10*3/uL (ref 0.0–0.1)
Basophils Relative: 0 %
Eosinophils Absolute: 0 10*3/uL (ref 0.0–0.5)
Eosinophils Relative: 0 %
HCT: 48.1 % (ref 39.0–52.0)
Hemoglobin: 15.6 g/dL (ref 13.0–17.0)
Immature Granulocytes: 0 %
Lymphocytes Relative: 13 %
Lymphs Abs: 0.9 10*3/uL (ref 0.7–4.0)
MCH: 28.7 pg (ref 26.0–34.0)
MCHC: 32.4 g/dL (ref 30.0–36.0)
MCV: 88.4 fL (ref 80.0–100.0)
Monocytes Absolute: 0.2 10*3/uL (ref 0.1–1.0)
Monocytes Relative: 3 %
Neutro Abs: 5.4 10*3/uL (ref 1.7–7.7)
Neutrophils Relative %: 84 %
Platelets: 223 10*3/uL (ref 150–400)
RBC: 5.44 MIL/uL (ref 4.22–5.81)
RDW: 14.1 % (ref 11.5–15.5)
WBC: 6.5 10*3/uL (ref 4.0–10.5)
nRBC: 0 % (ref 0.0–0.2)

## 2019-06-21 LAB — VALPROIC ACID LEVEL: Valproic Acid Lvl: 148 ug/mL — ABNORMAL HIGH (ref 50.0–100.0)

## 2019-06-21 MED ORDER — LORAZEPAM 1 MG PO TABS: 1.0000 mg | ORAL_TABLET | Freq: Once | ORAL | Status: DC

## 2019-06-21 MED ORDER — LORAZEPAM 2 MG/ML IJ SOLN: 1.0000 mg | Freq: Once | INTRAMUSCULAR | Status: DC

## 2019-06-21 MED ORDER — LORAZEPAM 2 MG/ML IJ SOLN
2.0000 mg | Freq: Once | INTRAMUSCULAR | Status: AC
  Administered 2019-06-21: 15:00:00 2 mg via INTRAMUSCULAR
  Filled 2019-06-21: qty 1

## 2019-06-21 NOTE — Telephone Encounter (Signed)
Pt mother called in stated patient was beinfg seen in ED , pt then left AMA , she states he has had 4 seizures today , she is requesting a call to see what his levels were, advised pts mom pt needed to be seen in the office due to him not being seen in a year , offered appt with Amy Lomax,NP for 07/02/2019 pts mother denied appt and stated she will have to work.

## 2019-06-21 NOTE — ED Notes (Signed)
Came out of another patients room, patient was up walking up in the hallways. Encouraged patient to come back into room. Mother was close to him. Mother was upset about not giving him additional Ativan. Informed patients mother it is not order for additional medication. Informed Dr. Ronnald Nian of patients condition. Dr. Ronnald Nian has come to speak with patient and patients mother. Patient is going to sign out AMA.

## 2019-06-21 NOTE — ED Provider Notes (Signed)
[facility] DEPT Provider Note   CSN: [id number] Arrival date & time: 06/21/19  1344     History   Chief Complaint Chief Complaint  Patient presents with  . Seizures    HPI Jadarrius E Cordrey is a 23 y.o. male.     The history is provided by a relative and the patient.  Seizures Seizure activity on arrival: no   Seizure type:  Focal Initial focality:  Facial Episode characteristics: abnormal movements and partial responsiveness   Postictal symptoms: confusion (agitation )   Return to baseline: no   Severity:  Moderate Timing:  Clustered Number of seizures this episode:  3-4 Context: medical non-compliance (possibly, currently on 3000 mg of depakote)   Recent head injury:  No recent head injuries PTA treatment:  None History of seizures: yes     Past Medical History:  Diagnosis Date  . ADHD   . Scoliosis   . Seizures ([place])    most recent 12/02/17    Patient Active Problem List   Diagnosis Date Noted  . Seizures ([place]) 07/10/2018  . 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 Noncompliance with medication regimen 12/11/2017  . Seizure ([place]) 05/24/2016  . Dehydration, mild 05/24/2016  . AKI (acute kidney injury) ([employer]) 05/24/2016  . Marijuana use 05/24/2016    Past Surgical History:  Procedure Laterality Date  . NO PAST SURGERIES          Home Medications    Prior to Admission medications   Medication Sig Start Date End Date Taking? Authorizing Provider  busPIRone (BUSPAR) 5 MG tablet Take 1 tablet (5 mg total) by mouth 3 (three) times daily. Patient will pick up scripts today. 04/23/19  Yes Gildardo Pounds, NP  divalproex (DEPAKOTE ER) 500 MG 24 hr tablet Take 6,000 mg by mouth at bedtime.   Yes [provider]  divalproex (DEPAKOTE ER) 500 MG 24 hr tablet Take 6 tablets (3,000 mg total) by mouth at bedtime. 04/23/19 05/23/19  Gildardo Pounds, NP    Family History Family  History  Problem Relation Age of Onset  . Diabetes Mother   . Hypertension Mother   . Cancer Other   . Diabetes Father   . Seizures Maternal Grandfather     Social History Social History   Tobacco Use  . Smoking status: Never Smoker  . Smokeless tobacco: Never Used  Substance Use Topics  . Alcohol use: No  . Drug use: Yes    Types: Marijuana    Comment: Daily use of marijuana.     Allergies   Keppra [levetiracetam], Tramadol, and Vimpat [lacosamide]   Review of Systems Review of Systems  Constitutional: Negative for chills and fever.  HENT: Negative for ear pain and sore throat.   Eyes: Negative for pain and visual disturbance.  Respiratory: Negative for cough and shortness of breath.   Cardiovascular: Negative for chest pain and palpitations.  Gastrointestinal: Negative for abdominal pain and vomiting.  Genitourinary: Negative for dysuria and hematuria.  Musculoskeletal: Negative for arthralgias and back pain.  Skin: Negative for color change and rash.  Neurological: Positive for seizures. Negative for syncope.  All other systems reviewed and are negative.    Physical Exam Updated Vital Signs  ED Triage Vitals [06/21/19 1424]  Enc Vitals Group     BP 121/72     Pulse Rate 65     Resp (!) 26     Temp 98.5 F (36.9 C)  Temp Source Oral     SpO2 98 %     Weight      Height      Head Circumference      Peak Flow      Pain Score      Pain Loc      Pain Edu?      Excl. in [place]?     Physical Exam Vitals signs and nursing note reviewed.  Constitutional:      General: He is not in acute distress.    Appearance: He is well-developed.  HENT:     Head: Normocephalic and atraumatic.     Nose: Nose normal.     Mouth/Throat:     Mouth: Mucous membranes are moist.  Eyes:     Conjunctiva/sclera: Conjunctivae normal.     Pupils: Pupils are equal, round, and reactive to light.  Neck:     Musculoskeletal: Normal range of motion and neck supple.   Cardiovascular:     Rate and Rhythm: Normal rate and regular rhythm.     Pulses: Normal pulses.     Heart sounds: Normal heart sounds. No murmur.  Pulmonary:     Effort: Pulmonary effort is normal. No respiratory distress.     Breath sounds: Normal breath sounds.  Abdominal:     General: Abdomen is flat.     Palpations: Abdomen is soft.     Tenderness: There is no abdominal tenderness.  Musculoskeletal: Normal range of motion.  Skin:    General: Skin is warm and dry.  Neurological:     General: No focal deficit present.     Mental Status: He is alert.     Comments: Patient moves all extremities, is able to tell me his name but he appears agitated and overall difficult to fully assess      ED Treatments / Results  Labs (all labs ordered are listed, but only abnormal results are displayed) Labs Reviewed  VALPROIC ACID LEVEL - Abnormal; Notable for the following components:      Result Value   Valproic Acid Lvl 148 (*)    All other components within normal limits  BASIC METABOLIC PANEL - Abnormal; Notable for the following components:   Glucose, Bld 103 (*)    All other components within normal limits  CBC WITH DIFFERENTIAL/PLATELET  RAPID URINE DRUG SCREEN, HOSP PERFORMED    EKG EKG Interpretation  Date/Time:  Monday June 21 2019 14:20:51 EDT Ventricular Rate:  71 PR Interval:    QRS Duration: 81 QT Interval:  358 QTC Calculation: 389 R Axis:   72 Text Interpretation:  Sinus rhythm LVH by voltage Confirmed by Virgina Norfolk [phone]) on 06/21/2019 2:43:51 PM   Radiology No results found.  Procedures Procedures (including critical care time)  Medications Ordered in ED Medications  LORazepam (ATIVAN) injection 2 mg (2 mg Intramuscular Given 06/21/19 1440)     Initial Impression / Assessment and Plan / ED Course  I have reviewed the triage vital signs and the nursing notes.  Pertinent labs & imaging results that were available during my care of the  patient were reviewed by me and considered in my medical decision making (see chart for details).     Harlem E Cradle is a 23 year old male with history of seizures who presents the ED with seizures.  Patient with unremarkable vitals.  No fever.  Patient not back to his baseline.  Patient was with his mother who states that he had at least 2 seizures.  Patient has staring off type seizures.  Does get agitated after his seizures.  Has had work-up with neurology outpatient.  He is on Depakote nightly.  Possibly noncompliant as he has been noncompliant in the past.  Had recent EEG with neurology about a month ago that showed epileptic type activity.  Depakote level about 3 weeks ago was at goal.  Patient had seizure-like activity while I was in the room with staring off spell.  Resolved on its own.  Gave a dose of IM Ativan 2 mg.  Will get basic lab work, Depakote level, head CT.  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  Dr. Amada JupiterKirkpatrick recommends evaluation in the ED at this time and if patient does not return back to baseline may need admission.  If he does have return back to baseline may make medication adjustment depending on Depakote level.  While awaiting work-up patient had return to baseline.  He did not want to stay for further evaluation.  Patient was with his mother.  He states that he is compliant with his medications.  He does not want to stay.  Recommend that he stay for us to check his valproic acid level as well as basic labs and wait for neurology recommendations based off of valproic acid level.  However patient has capacity to leave AGAINST MEDICAL ADVICE and was discharged from ED in good condition prior to any labs being back.  Told him to follow-up closely with his neurologist to follow-up with Depakote level that has been drawn but has not resulted.  He may need further medication adjustments as he continues to have breakthrough seizures despite changes in  his medications recently.  Neurologically stable at time of AMA.  This chart was dictated using voice recognition software.  Despite best efforts to proofread,  errors can occur which can change the documentation meaning.    Final Clinical Impressions(s) / ED Diagnoses   Final diagnoses:  Seizure-like activity [facility](HCC)    ED Discharge Orders    None       Virgina NorfolkCuratolo, Zamyah Wiesman, DO 06/21/19 1754

## 2019-06-21 NOTE — ED Notes (Signed)
When entering the room, patient was resting quietly. However, while in the  Room, he has intermittent times where he is confused, attempted to pull the pulse ox off once, but redirected. Patients mother remains at bedside. She was upset that he was not taken to [facility] instead of [place]. Informed her that patient may be redirected to [facility] depending on the emergency department patient load in the two different departments. After administering IM Ativan, reapplied a 5 lead cardiac monitor to patients chest. Will give patient a few minutes before attempted to go further in treatment.

## 2019-06-21 NOTE — ED Notes (Signed)
Went by room, noticed patient was standing up at bedside, attempting to remove cardiac monitor leads. Redirected patient back into bed. He is still disoriented. Will keep door open to visualize patient more constantly.

## 2019-06-21 NOTE — ED Triage Notes (Signed)
Patient is from home and transported via [facility]. EMS states mother witnessed two seizures. On the first seizure, patient refused transport. Then, 30 minutes later, patients mother called back to EMS. Fire department was able to get one set of vital signs, BP: 144/70, HR: 66, and O2 sat: 97%. EMS was unable to obtain vital signs, EMS stated that he put his arms in his shirt and would not say or do anything. As soon as patient got off the EMS stretcher, he would not sit on the stretcher, and constantly walked around, stating "where is my momma". He has a steady gait. Notified Dr. Azucena Freed to come evaluate patient and to see about further treatment. Patients mother at bedside.

## 2019-06-22 NOTE — Telephone Encounter (Signed)
The patient had good Depakote levels in the ER, he has no revisit scheduled, he will need to have a revisit at some point.

## 2019-06-22 NOTE — Telephone Encounter (Signed)
I have mailed a letter to the pt asking him to call our office directly to make an appt.

## 2019-06-23 NOTE — Telephone Encounter (Signed)
I called the mother.  The patient continues to have intractable seizures, we will have a revisit in November, will need to add another medication to his regimen.  He had a high random level of Depakote of 148, but on the same dosing, in August he had another level of 101, I would not change the dosing for now.  Full control of his seizures I think are can be quite difficult, he continues to have frequent seizures even on adequate doses of medication.  He continues to be somewhat moody.  In the past he could not tolerate Keppra, he was on Vimpat previously, he has been on Dilantin.  He remains on Depakote as a solo agent.  He was seen by Dr. Arlyn Leak from [facility], epilepsy monitoring was appropriately recommended, it is not clear that he has had this study.  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   Left temporal sharp transients are of unclear clinical significance. Further monitoring is recommended for definitive evaluation.

## 2019-06-23 NOTE — Telephone Encounter (Signed)
Returned pts mothers call, she stated she would like a call back from Dr Jannifer Franklin she feels as if pts Depakote levels were high, we were able to schedule an appt for Nov 4 at 3:15 due to moms work schedule we couldn't schedule any sooner

## 2019-07-28 ENCOUNTER — Ambulatory Visit: Payer: Self-pay | Admitting: Neurology

## 2019-07-30 ENCOUNTER — Encounter (HOSPITAL_COMMUNITY): Payer: Self-pay

## 2019-07-30 ENCOUNTER — Ambulatory Visit (HOSPITAL_COMMUNITY)
Admission: EM | Admit: 2019-07-30 | Discharge: 2019-07-30 | Disposition: A | Discharge status: Home / Self Care | Payer: Medicaid Other | Attending: Emergency Medicine | Admitting: Emergency Medicine

## 2019-07-30 ENCOUNTER — Other Ambulatory Visit: Payer: Self-pay

## 2019-07-30 DIAGNOSIS — M79671 Pain in right foot: Secondary | ICD-10-CM | POA: Diagnosis not present

## 2019-07-30 MED ORDER — IBUPROFEN 800 MG PO TABS: 800.0000 mg | ORAL_TABLET | Freq: Three times a day (TID) | ORAL | 0 refills | Status: DC | PRN

## 2019-07-30 NOTE — ED Triage Notes (Signed)
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 Patient denies injury or trauma.

## 2019-07-30 NOTE — Discharge Instructions (Addendum)
Take the ibuprofen as prescribed.  Rest and elevate your foot.  Apply ice packs 2-3 times a day for up to 20 minutes each.  Wear the Ace wrap as needed for comfort.   ° °Follow up with your primary care provider or an orthopedist if you symptoms continue or worsen;  Or if you develop new symptoms, such as numbness, tingling, or weakness.   ° ° ° °

## 2019-07-30 NOTE — ED Provider Notes (Signed)
[facility]    CSN: [id number] Arrival date & time: 07/30/19  1009      History   Chief Complaint Chief Complaint  Patient presents with  . Foot Pain    Right    HPI Dylan Moon is a 23 y.o. male.   Patient presents with right foot pain since yesterday evening.  He denies known cause; no trauma, falls, or injury.  He states he was standing and suddenly felt sharp pain in his foot, which has continued intermittently.  He describes the pain as sharp, intermittent, 10/10.  No treatments attempted at home.  He denies numbness, weakness, tingling, wounds, rash, or other symptoms.  The history is provided by the patient.    Past Medical History:  Diagnosis Date  . ADHD   . Scoliosis   . Seizures (HCC)    most recent 12/02/17    Patient Active Problem List   Diagnosis Date Noted  . Seizures (HCC) 07/10/2018  . 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 Noncompliance with medication regimen 12/11/2017  . Seizure (HCC) 05/24/2016  . Dehydration, mild 05/24/2016  . AKI (acute kidney injury) (HCC) 05/24/2016  . Marijuana use 05/24/2016    Past Surgical History:  Procedure Laterality Date  . NO PAST SURGERIES         Home Medications    Prior to Admission medications   Medication Sig Start Date End Date Taking? Authorizing Provider  busPIRone (BUSPAR) 5 MG tablet Take 1 tablet (5 mg total) by mouth 3 (three) times daily. Patient will pick up scripts today. 04/23/19   Claiborne RiggFleming, Zelda W, NP  divalproex (DEPAKOTE ER) 500 MG 24 hr tablet Take 6 tablets (3,000 mg total) by mouth at bedtime. 04/23/19 05/23/19  Claiborne RiggFleming, Zelda W, NP  divalproex (DEPAKOTE ER) 500 MG 24 hr tablet Take 6,000 mg by mouth at bedtime.    [provider]  ibuprofen (ADVIL) 800 MG tablet Take 1 tablet (800 mg total) by mouth every 8 (eight) hours as needed. 07/30/19   Mickie Bailate, Shamonica Schadt H, NP    Family History Family History  Problem Relation Age of  Onset  . Diabetes Mother   . Hypertension Mother   . Cancer Other   . Diabetes Father   . Seizures Maternal Grandfather     Social History Social History   Tobacco Use  . Smoking status: Never Smoker  . Smokeless tobacco: Never Used  Substance Use Topics  . Alcohol use: No  . Drug use: Yes    Types: Marijuana    Comment: Daily use of marijuana.     Allergies   Keppra [levetiracetam], Tramadol, and Vimpat [lacosamide]   Review of Systems Review of Systems  Constitutional: Negative for chills and fever.  HENT: Negative for ear pain and sore throat.   Eyes: Negative for pain and visual disturbance.  Respiratory: Negative for cough and shortness of breath.   Cardiovascular: Negative for chest pain and palpitations.  Gastrointestinal: Negative for abdominal pain and vomiting.  Genitourinary: Negative for dysuria and hematuria.  Musculoskeletal: Positive for arthralgias. Negative for back pain.  Skin: Negative for color change and rash.  Neurological: Negative for seizures, syncope, weakness and numbness.  All other systems reviewed and are negative.    Physical Exam Triage Vital Signs ED Triage Vitals  Enc Vitals Group     BP      Pulse      Resp      Temp  Temp src      SpO2      Weight      Height      Head Circumference      Peak Flow      Pain Score      Pain Loc      Pain Edu?      Excl. in [place]?    No data found.  Updated Vital Signs BP 137/83 (BP Location: Left Arm)   Pulse 65   Temp 98.6 F (37 C) (Oral)   Resp 16   SpO2 100%   Visual Acuity Right Eye Distance:   Left Eye Distance:   Bilateral Distance:    Right Eye Near:   Left Eye Near:    Bilateral Near:     Physical Exam Vitals signs and nursing note reviewed.  Constitutional:      Appearance: He is well-developed.  HENT:     Head: Normocephalic and atraumatic.  Eyes:     Conjunctiva/sclera: Conjunctivae normal.  Neck:     Musculoskeletal: Neck supple.  Cardiovascular:      Rate and Rhythm: Normal rate and regular rhythm.     Heart sounds: No murmur.  Pulmonary:     Effort: Pulmonary effort is normal. No respiratory distress.     Breath sounds: Normal breath sounds.  Abdominal:     Palpations: Abdomen is soft.     Tenderness: There is no abdominal tenderness.  Musculoskeletal: Normal range of motion.        General: Tenderness present. No swelling or deformity.     Comments: Arch of right foot tender with rotation of foot.  No wounds; No ecchymosis.  2+ pulses, sensation intact.  FROM.   Skin:    General: Skin is warm and dry.     Capillary Refill: Capillary refill takes less than 2 seconds.     Findings: No bruising, erythema, lesion or rash.  Neurological:     General: No focal deficit present.     Mental Status: He is alert and oriented to person, place, and time.     Sensory: No sensory deficit.     Motor: No weakness.     Gait: Gait normal.      UC Treatments / Results  Labs (all labs ordered are listed, but only abnormal results are displayed) Labs Reviewed - No data to display  EKG   Radiology No results found.  Procedures Procedures (including critical care time)  Medications Ordered in UC Medications - No data to display  Initial Impression / Assessment and Plan / UC Course  I have reviewed the triage vital signs and the nursing notes.  Pertinent labs & imaging results that were available during my care of the patient were reviewed by me and considered in my medical decision making (see chart for details).    Right foot pain.  Treating with ibuprofen.  Instructed patient to rest and elevate his foot; to apply ice packs as directed; to wear the ace wrap for comfort.  Directed him to follow-up with his PCP or an orthopedist if his symptoms persist or worsen; or he develops new symptoms such as numbness, weakness, tingling.  Patient agrees to plan of care.     Final Clinical Impressions(s) / UC Diagnoses   Final diagnoses:   Foot pain, right     Discharge Instructions     Take the ibuprofen as prescribed.  Rest and elevate your foot.  Apply ice packs 2-3 times a day  for up to 20 minutes each.  Wear the Ace wrap as needed for comfort.    Follow up with your primary care provider or an orthopedist if you symptoms continue or worsen;  Or if you develop new symptoms, such as numbness, tingling, or weakness.       ED Prescriptions    Medication Sig Dispense Auth. Provider   ibuprofen (ADVIL) 800 MG tablet Take 1 tablet (800 mg total) by mouth every 8 (eight) hours as needed. 21 tablet Sharion Balloon, NP     PDMP not reviewed this encounter.   Sharion Balloon, NP 07/30/19 [phone]

## 2019-10-12 ENCOUNTER — Other Ambulatory Visit: Payer: Self-pay

## 2019-10-12 ENCOUNTER — Ambulatory Visit (HOSPITAL_COMMUNITY)
Admission: EM | Admit: 2019-10-12 | Discharge: 2019-10-12 | Disposition: A | Discharge status: Home / Self Care | Payer: Medicaid Other | Attending: Family Medicine | Admitting: Family Medicine

## 2019-10-12 ENCOUNTER — Encounter (HOSPITAL_COMMUNITY): Payer: Self-pay | Admitting: Emergency Medicine

## 2019-10-12 DIAGNOSIS — Z113 Encounter for screening for infections with a predominantly sexual mode of transmission: Secondary | ICD-10-CM | POA: Diagnosis not present

## 2019-10-12 LAB — HIV ANTIBODY (ROUTINE TESTING W REFLEX): HIV Screen 4th Generation wRfx: NONREACTIVE

## 2019-10-12 NOTE — Discharge Instructions (Addendum)
Your results are accessible via my chart. Please use the information below to set-up this application on  your smart phone. Your results will be available within 3-5 business days.

## 2019-10-12 NOTE — ED Provider Notes (Addendum)
, [place]    CSN: [id number] Arrival date & time: 10/12/19  1716      History   Chief Complaint Chief Complaint  Patient presents with  . SEXUALLY TRANSMITTED DISEASE    HPI. Dylan Moon is a 24 y.o. male.   HPI  Sexually Transmitted Disease Check: Patient presents for sexually transmitted disease check. Sexual history reviewed with the patient. STD exposure: denies knowledge of risky exposure.  Current symptoms include none.  Contraception: none. Requests HIV and RPR screening today in addition to GC/Chlamydia. Past Medical History:  Diagnosis Date  . ADHD   . Scoliosis   . Seizures ([place])    most recent 12/02/17    Patient Active Problem List   Diagnosis Date Noted  . Seizures ([place]) 07/10/2018  . 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 Noncompliance with medication regimen 12/11/2017  . Seizure ([place]) 05/24/2016  . Dehydration, mild 05/24/2016  . AKI (acute kidney injury) ([place]) 05/24/2016  . Marijuana use 05/24/2016    Past Surgical History:  Procedure Laterality Date  . NO PAST SURGERIES         Home Medications    Prior to Admission medications   Medication Sig Start Date End Date Taking? Authorizing Provider  divalproex (DEPAKOTE ER) 500 MG 24 hr tablet Take 6,000 mg by mouth at bedtime.   Yes [provider]  busPIRone (BUSPAR) 5 MG tablet Take 1 tablet (5 mg total) by mouth 3 (three) times daily. Patient will pick up scripts today. 04/23/19   Gildardo Pounds, NP  divalproex (DEPAKOTE ER) 500 MG 24 hr tablet Take 6 tablets (3,000 mg total) by mouth at bedtime. 04/23/19 05/23/19  Gildardo Pounds, NP  ibuprofen (ADVIL) 800 MG tablet Take 1 tablet (800 mg total) by mouth every 8 (eight) hours as needed. 07/30/19   Sharion Balloon, NP    Family History Family History  Problem Relation Age of Onset  . Diabetes Mother   . Hypertension Mother   . Cancer Other   . Diabetes Father   . Seizures  Maternal Grandfather     Social History Social History   Tobacco Use  . Smoking status: Never Smoker  . Smokeless tobacco: Never Used  Substance Use Topics  . Alcohol use: No  . Drug use: Yes    Types: Marijuana    Comment: Daily use of marijuana.    Allergies   Keppra [levetiracetam], Tramadol, and Vimpat [lacosamide]   Review of Systems Review of Systems Pertinent negatives listed in HPI Physical Exam Triage Vital Signs ED Triage Vitals  Enc Vitals Group     BP 10/12/19 1829 (!) 144/79     Pulse Rate 10/12/19 1829 63     Resp 10/12/19 1829 18     Temp 10/12/19 1829 98.3 F (36.8 C)     Temp Source 10/12/19 1829 Oral     SpO2 10/12/19 1829 99 %     Weight --      Height --      Head Circumference --      Peak Flow --      Pain Score 10/12/19 1826 0     Pain Loc --      Pain Edu? --      Excl. in [place]? --    No data found.  Updated Vital Signs BP (!) 144/79 (BP Location: Right Arm)   Pulse 63   Temp 98.3 F (36.8 C) (  Oral)   Resp 18   SpO2 99%   Visual Acuity Right Eye Distance:   Left Eye Distance:   Bilateral Distance:    Right Eye Near:   Left Eye Near:    Bilateral Near:     Physical Exam General appearance: alert, well developed, well nourished, cooperative and in no distress Head: Normocephalic, without obvious abnormality, atraumatic Respiratory: Respirations even and unlabored, normal respiratory rate Heart: rate and rhythm normal.  Extremities: No gross deformities Skin: Skin color, texture, turgor normal. No rashes seen  Psych: Appropriate mood and affect. Neurologic: Mental status: Alert, oriented to person, place, and time, thought content appropriate.  UC Treatments / Results  Labs (all labs ordered are listed, but only abnormal results are displayed) Labs Reviewed - No data to display  EKG   Radiology No results found.  Procedures Procedures (including critical care time)  Medications Ordered in UC Medications - No  data to display  Initial Impression / Assessment and Plan / UC Course  I have reviewed the triage vital signs and the nursing notes.  Pertinent labs & imaging results that were available during my care of the patient were reviewed by me and considered in my medical decision making (see chart for details).    Encounter for routine STD screening. Urethral cytology, HIV, and RPR pending. Encouraged barrier protection with sexual intercourse to reduce the risk of acquiring or spreading STD. Education provided.  Final Clinical Impressions(s) / UC Diagnoses   Final diagnoses:  Screen for STD (sexually transmitted disease)     Discharge Instructions     Your results are accessible via my chart. Please use the information below to set-up this application on  your smart phone. Your results will be available within 3-5 business days.    ED Prescriptions    None     PDMP not reviewed this encounter.   Bing Neighbors, FNP 10/14/19 0038    Bing Neighbors, FNP 10/14/19 0038    Bing Neighbors, FNP 10/22/19 [phone]

## 2019-10-12 NOTE — ED Triage Notes (Addendum)
patient denies any std symptoms.  Patient is just wanting to get checked  Patient has remained looking at phone and texting during intake assessment

## 2019-10-13 LAB — RPR: RPR Ser Ql: NONREACTIVE

## 2019-10-14 LAB — CYTOLOGY, (ORAL, ANAL, URETHRAL) ANCILLARY ONLY
Chlamydia: NEGATIVE
Neisseria Gonorrhea: NEGATIVE
Trichomonas: NEGATIVE

## 2019-10-20 ENCOUNTER — Emergency Department (HOSPITAL_COMMUNITY)
Admission: EM | Admit: 2019-10-20 | Discharge: 2019-10-21 | Discharge status: Against Medical Advice | Payer: Medicaid Other | Attending: Emergency Medicine | Admitting: Emergency Medicine

## 2019-10-20 ENCOUNTER — Encounter (HOSPITAL_COMMUNITY): Payer: Self-pay | Admitting: Emergency Medicine

## 2019-10-20 DIAGNOSIS — Z20822 Contact with and (suspected) exposure to covid-19: Secondary | ICD-10-CM | POA: Insufficient documentation

## 2019-10-20 DIAGNOSIS — Z008 Encounter for other general examination: Secondary | ICD-10-CM

## 2019-10-20 DIAGNOSIS — F1220 Cannabis dependence, uncomplicated: Secondary | ICD-10-CM | POA: Insufficient documentation

## 2019-10-20 DIAGNOSIS — F4325 Adjustment disorder with mixed disturbance of emotions and conduct: Secondary | ICD-10-CM | POA: Diagnosis present

## 2019-10-20 DIAGNOSIS — R451 Restlessness and agitation: Secondary | ICD-10-CM | POA: Insufficient documentation

## 2019-10-20 DIAGNOSIS — F329 Major depressive disorder, single episode, unspecified: Secondary | ICD-10-CM | POA: Diagnosis present

## 2019-10-20 LAB — CBC WITH DIFFERENTIAL/PLATELET
Abs Immature Granulocytes: 0.02 10*3/uL (ref 0.00–0.07)
Basophils Absolute: 0 10*3/uL (ref 0.0–0.1)
Basophils Relative: 0 %
Eosinophils Absolute: 0 10*3/uL (ref 0.0–0.5)
Eosinophils Relative: 0 %
HCT: 49.1 % (ref 39.0–52.0)
Hemoglobin: 16 g/dL (ref 13.0–17.0)
Immature Granulocytes: 0 %
Lymphocytes Relative: 23 %
Lymphs Abs: 1.5 10*3/uL (ref 0.7–4.0)
MCH: 28.3 pg (ref 26.0–34.0)
MCHC: 32.6 g/dL (ref 30.0–36.0)
MCV: 86.7 fL (ref 80.0–100.0)
Monocytes Absolute: 0.5 10*3/uL (ref 0.1–1.0)
Monocytes Relative: 7 %
Neutro Abs: 4.5 10*3/uL (ref 1.7–7.7)
Neutrophils Relative %: 70 %
Platelets: 207 10*3/uL (ref 150–400)
RBC: 5.66 MIL/uL (ref 4.22–5.81)
RDW: 13.2 % (ref 11.5–15.5)
WBC: 6.6 10*3/uL (ref 4.0–10.5)
nRBC: 0 % (ref 0.0–0.2)

## 2019-10-20 LAB — COMPREHENSIVE METABOLIC PANEL
ALT: 11 U/L (ref 0–44)
AST: 16 U/L (ref 15–41)
Albumin: 4.9 g/dL (ref 3.5–5.0)
Alkaline Phosphatase: 35 U/L — ABNORMAL LOW (ref 38–126)
Anion gap: 9 (ref 5–15)
BUN: 13 mg/dL (ref 6–20)
CO2: 27 mmol/L (ref 22–32)
Calcium: 9.4 mg/dL (ref 8.9–10.3)
Chloride: 103 mmol/L (ref 98–111)
Creatinine, Ser: 1.12 mg/dL (ref 0.61–1.24)
GFR calc Af Amer: >60 mL/min (ref >60)
GFR calc non Af Amer: >60 mL/min (ref >60)
Glucose, Bld: 97 mg/dL (ref 70–99)
Potassium: 4.2 mmol/L (ref 3.5–5.1)
Sodium: 139 mmol/L (ref 135–145)
Total Bilirubin: 0.7 mg/dL (ref 0.3–1.2)
Total Protein: 7.6 g/dL (ref 6.5–8.1)

## 2019-10-20 LAB — RAPID URINE DRUG SCREEN, HOSP PERFORMED
Amphetamines: NOT DETECTED
Barbiturates: NOT DETECTED
Benzodiazepines: NOT DETECTED
Cocaine: NOT DETECTED
Opiates: POSITIVE — AB
Tetrahydrocannabinol: POSITIVE — AB

## 2019-10-20 LAB — RESPIRATORY PANEL BY RT PCR (FLU A&B, COVID)
Influenza A by PCR: NEGATIVE
Influenza B by PCR: NEGATIVE
SARS Coronavirus 2 by RT PCR: NEGATIVE

## 2019-10-20 LAB — SALICYLATE LEVEL: Salicylate Lvl: <7 mg/dL — ABNORMAL LOW (ref 7.0–30.0)

## 2019-10-20 LAB — ACETAMINOPHEN LEVEL: Acetaminophen (Tylenol), Serum: <10 ug/mL — ABNORMAL LOW (ref 10–30)

## 2019-10-20 LAB — ETHANOL: Alcohol, Ethyl (B): <10 mg/dL (ref <10)

## 2019-10-20 MED ORDER — ZOLPIDEM TARTRATE 5 MG PO TABS
5.0000 mg | ORAL_TABLET | Freq: Every evening | ORAL | Status: DC | PRN
  Administered 2019-10-20: 5 mg via ORAL
  Filled 2019-10-20: qty 1

## 2019-10-20 MED ORDER — ONDANSETRON HCL 4 MG PO TABS: 4.0000 mg | ORAL_TABLET | Freq: Three times a day (TID) | ORAL | Status: DC | PRN

## 2019-10-20 MED ORDER — DIVALPROEX SODIUM ER 500 MG PO TB24: 2500.0000 mg | ORAL_TABLET | Freq: Every day | ORAL | Status: DC

## 2019-10-20 MED ORDER — ESLICARBAZEPINE ACETATE 800 MG PO TABS: 800.0000 mg | ORAL_TABLET | Freq: Every day | ORAL | Status: DC

## 2019-10-20 MED ORDER — ALUM & MAG HYDROXIDE-SIMETH 200-200-20 MG/5ML PO SUSP: 30.0000 mL | Freq: Four times a day (QID) | ORAL | Status: DC | PRN

## 2019-10-20 MED ORDER — ACETAMINOPHEN 325 MG PO TABS: 650.0000 mg | ORAL_TABLET | ORAL | Status: DC | PRN

## 2019-10-20 MED ORDER — ZIPRASIDONE MESYLATE 20 MG IM SOLR
20.0000 mg | Freq: Once | INTRAMUSCULAR | Status: AC
  Administered 2019-10-20: 20 mg via INTRAMUSCULAR
  Filled 2019-10-20: qty 20

## 2019-10-20 MED ORDER — NICOTINE 21 MG/24HR TD PT24
21.0000 mg | MEDICATED_PATCH | Freq: Every day | TRANSDERMAL | Status: DC
  Filled 2019-10-20 (×2): qty 1
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
## 2019-10-20 NOTE — B[facility] Counselor (Signed)
Clinician spoke to Junior, Charity fundraiser and noted the Diplomatic Services operational officer is working on the pt's IVC. Clinician noted from RN the pt received medications due to agitation. RN to follow up with secretary on IVC paperwork. Clincain expressed she will call back to check in, if pt is sleeping the assessment will be completed once he is alert, and oriented.     Redmond Pulling, MS, [facility], [facility] Triage Specialist [phone].
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
## 2019-10-20 NOTE — B[facility] Counselor (Signed)
At 2040, Clinician received only the first page of the First Examination for Involuntary Commitment. Clinician spoke to staff on what she received and expressed she will go ahead and assess the pt. Clinician expressed she will call the cart in two minutes.    At 2042, Clincian received a call from Junior, RN expressing he faxed the pt's IVC paperwork. Clinician noted that the pt was now sleep after recieved Geodon. Clinician asked RN to call once the pt is wake, and able to engage.    Redmond Pulling, MS, [facility], [facility] Triage Specialist [phone].

## 2019-10-20 NOTE — Progress Notes (Signed)
Patient agitated and tried to get out. PA informed. Geodon IM given. Patient asleep at this time. Will continue to monitor.

## 2019-10-20 NOTE — B[facility] Counselor (Signed)
Per Eunice Blase, Secondary school teacher she gave RN pt's IVC paperwork. Clinician spoke to Morrie Sheldon to fax [phone]) pt's IVC paperwork. Clinician expressed once paperwork is received/reviewed she will call back to assess the pt (if he's awake.)    Redmond Pulling, MS, [facility], [facility] Triage Specialist [phone]

## 2019-10-20 NOTE — B[facility] Counselor (Signed)
Clinician spoke to Junior, RN and noted the pt's IVC paperwork is still being processed. Junior, RN expressed he was going to check. Clinician provided fax number [phone]). Clinician to call RN back in about 20 minutes. IVC paperwork to be received before TTS assessment is completed.     Redmond Pulling, MS, [facility], [facility] Triage Specialist [phone].

## 2019-10-21 LAB — VALPROIC ACID LEVEL: Valproic Acid Lvl: 118 ug/mL — ABNORMAL HIGH (ref 50.0–100.0)

## 2019-10-21 MED ORDER — PALIPERIDONE ER 3 MG PO TB24
3.0000 mg | ORAL_TABLET | Freq: Every day | ORAL | Status: DC
  Administered 2019-10-21: 3 mg via ORAL
  Filled 2019-10-21: qty 1

## 2019-10-21 MED ORDER — ZIPRASIDONE MESYLATE 20 MG IM SOLR: 20.0000 mg | Freq: Two times a day (BID) | INTRAMUSCULAR | Status: DC | PRN

## 2019-10-21 MED ORDER — LORAZEPAM 1 MG PO TABS: 1.0000 mg | ORAL_TABLET | ORAL | Status: DC | PRN

## 2019-10-21 MED ORDER — ZIPRASIDONE MESYLATE 20 MG IM SOLR: 20.0000 mg | Freq: Once | INTRAMUSCULAR | Status: DC
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
## 2019-10-21 NOTE — [facility] Counselor (Signed)
Clinician spoke to Junior, RN and noted the pt is still sleeping. Clincain asked the RN to call when the pt is awake, alert and able to engage in TTS assessment.     Redmond Pulling, MS, [facility], [facility] Triage Specialist [phone].

## 2019-10-21 NOTE — BH Assessment (Signed)
[facility] Assessment Progress Note  Per Nelly Rout, MD, this pt requires psychiatric hospitalization.  Jasmine has assigned pt to [facility] Rm 508-2; [facility] will be ready to receive pt at 15:00.  Pt presents under IVC initiated by Adrian Prince (identified on the petition as pt's son, but more likely his mother), and upheld by EDP Marianna Fuss, MD, and IVC documents have been faxed to [facility].  [name] nurse, Waynetta Sandy, has been notified, and agrees to call report to [phone].  Pt is to be transported via Patent [employer].   Doylene Canning, [place] [facility] Coordinator [phone]
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
## 2019-10-21 NOTE — ED Notes (Signed)
Pt ran off the unit, security notified and looking for the pt.

## 2019-10-21 NOTE — Progress Notes (Signed)
Patient eloped from room. Patient ran through hospital and outside. Patient is a psych patient, IVC, and was to be transferred to [facility] at 15:00 per notes. Security and [employer] responded and now looking for patient.   Geralyn Corwin, LCSW Transitions of Care Department [facility] ED [phone]

## 2019-10-24 ENCOUNTER — Inpatient Hospital Stay (HOSPITAL_COMMUNITY): Admission: AD | Admit: 2019-10-24 | Payer: Medicaid Other | Admitting: Psychiatry

## 2019-11-04 ENCOUNTER — Telehealth: Payer: Self-pay

## 2019-11-04 NOTE — Telephone Encounter (Signed)
Patient mother called to inform that patient is having some issues with his medication they dont believe its working for him and he has also been more agitated   Requested a CB

## 2019-11-04 NOTE — Telephone Encounter (Signed)
I called pts mom about wanting valium or xanax for pt a EEGthat's order by his neurologist in [facility]. I advise the mom to call the other Neurologist in [place] who order the scan to prescribed meds for her sons scan. I stated her son was last seen 2019. She also wanted a letter for her son to go to court saying he has brain damage. I stated the other neurologist in [place] has a call made today by her stating she needs the same letter stating her son has brain damage. The mom stated the other neurologist has not call them back yet. She stated Dr. Anne Hahn knows her son and wants a call from him. I was unable to find out from mom was she transferring care back to [facility]. I stated message will be sent to Dr. Anne Hahn.

## 2019-11-07 ENCOUNTER — Encounter: Payer: Self-pay | Admitting: Neurology

## 2019-11-07 NOTE — Telephone Encounter (Signed)
Letter needed, I have redone the letter that was written in March 2020.

## 2019-11-08 ENCOUNTER — Encounter: Payer: Self-pay | Admitting: Neurology

## 2019-11-08 NOTE — Telephone Encounter (Signed)
I called pts mom that letter was written by Dr. Pattricia Boss and put in the mail today. I verified the mailing address in the system and mom confirmed it.

## 2019-11-08 NOTE — Telephone Encounter (Signed)
Letter put in mail for pts mom for her son.

## 2019-11-13 ENCOUNTER — Ambulatory Visit (HOSPITAL_COMMUNITY)
Admission: AD | Admit: 2019-11-13 | Discharge: 2019-11-13 | Disposition: A | Discharge status: Home / Self Care | Payer: Medicaid Other | Attending: Psychiatry | Admitting: Psychiatry

## 2019-11-13 DIAGNOSIS — F411 Generalized anxiety disorder: Secondary | ICD-10-CM | POA: Diagnosis not present

## 2019-11-13 DIAGNOSIS — F913 Oppositional defiant disorder: Secondary | ICD-10-CM | POA: Insufficient documentation

## 2019-11-13 DIAGNOSIS — F909 Attention-deficit hyperactivity disorder, unspecified type: Secondary | ICD-10-CM | POA: Insufficient documentation
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
## 2019-12-24 ENCOUNTER — Inpatient Hospital Stay (HOSPITAL_COMMUNITY)
Admission: EM | Admit: 2019-12-24 | Discharge: 2020-01-01 | DRG: 100 | Discharge status: Against Medical Advice | Payer: Medicaid Other | Attending: Internal Medicine | Admitting: Internal Medicine

## 2019-12-24 ENCOUNTER — Emergency Department (HOSPITAL_COMMUNITY): Payer: Medicaid Other

## 2019-12-24 DIAGNOSIS — J9601 Acute respiratory failure with hypoxia: Secondary | ICD-10-CM | POA: Diagnosis present

## 2019-12-24 DIAGNOSIS — Z9114 Patient's other noncompliance with medication regimen: Secondary | ICD-10-CM

## 2019-12-24 DIAGNOSIS — N179 Acute kidney failure, unspecified: Secondary | ICD-10-CM | POA: Diagnosis present

## 2019-12-24 DIAGNOSIS — G9341 Metabolic encephalopathy: Secondary | ICD-10-CM | POA: Diagnosis not present

## 2019-12-24 DIAGNOSIS — F319 Bipolar disorder, unspecified: Secondary | ICD-10-CM | POA: Diagnosis present

## 2019-12-24 DIAGNOSIS — J15211 Pneumonia due to Methicillin susceptible Staphylococcus aureus: Secondary | ICD-10-CM | POA: Diagnosis present

## 2019-12-24 DIAGNOSIS — R410 Disorientation, unspecified: Secondary | ICD-10-CM | POA: Diagnosis not present

## 2019-12-24 DIAGNOSIS — W1839XA Other fall on same level, initial encounter: Secondary | ICD-10-CM | POA: Diagnosis present

## 2019-12-24 DIAGNOSIS — G40911 Epilepsy, unspecified, intractable, with status epilepticus: Secondary | ICD-10-CM | POA: Diagnosis not present

## 2019-12-24 DIAGNOSIS — M419 Scoliosis, unspecified: Secondary | ICD-10-CM | POA: Diagnosis present

## 2019-12-24 DIAGNOSIS — Z20822 Contact with and (suspected) exposure to covid-19: Secondary | ICD-10-CM | POA: Diagnosis present

## 2019-12-24 DIAGNOSIS — F419 Anxiety disorder, unspecified: Secondary | ICD-10-CM | POA: Diagnosis present

## 2019-12-24 DIAGNOSIS — Z79899 Other long term (current) drug therapy: Secondary | ICD-10-CM

## 2019-12-24 DIAGNOSIS — E870 Hyperosmolality and hypernatremia: Secondary | ICD-10-CM | POA: Diagnosis present

## 2019-12-24 DIAGNOSIS — G40901 Epilepsy, unspecified, not intractable, with status epilepticus: Secondary | ICD-10-CM | POA: Diagnosis not present

## 2019-12-24 DIAGNOSIS — D696 Thrombocytopenia, unspecified: Secondary | ICD-10-CM | POA: Diagnosis present

## 2019-12-24 DIAGNOSIS — E876 Hypokalemia: Secondary | ICD-10-CM | POA: Diagnosis present

## 2019-12-24 DIAGNOSIS — F4325 Adjustment disorder with mixed disturbance of emotions and conduct: Secondary | ICD-10-CM | POA: Diagnosis present

## 2019-12-24 DIAGNOSIS — Z9119 Patient's noncompliance with other medical treatment and regimen: Secondary | ICD-10-CM

## 2019-12-24 DIAGNOSIS — F1290 Cannabis use, unspecified, uncomplicated: Secondary | ICD-10-CM | POA: Diagnosis present

## 2019-12-24 DIAGNOSIS — Z820 Family history of epilepsy and other diseases of the nervous system: Secondary | ICD-10-CM | POA: Diagnosis not present

## 2019-12-24 DIAGNOSIS — Z781 Physical restraint status: Secondary | ICD-10-CM

## 2019-12-24 DIAGNOSIS — Z91199 Patient's noncompliance with other medical treatment and regimen due to unspecified reason: Secondary | ICD-10-CM

## 2019-12-24 DIAGNOSIS — J189 Pneumonia, unspecified organism: Secondary | ICD-10-CM | POA: Diagnosis not present

## 2019-12-24 DIAGNOSIS — Z888 Allergy status to other drugs, medicaments and biological substances status: Secondary | ICD-10-CM

## 2019-12-24 DIAGNOSIS — F29 Unspecified psychosis not due to a substance or known physiological condition: Secondary | ICD-10-CM | POA: Diagnosis present

## 2019-12-24 DIAGNOSIS — G92 Toxic encephalopathy: Secondary | ICD-10-CM | POA: Diagnosis present

## 2019-12-24 DIAGNOSIS — R809 Proteinuria, unspecified: Secondary | ICD-10-CM | POA: Diagnosis present

## 2019-12-24 DIAGNOSIS — R739 Hyperglycemia, unspecified: Secondary | ICD-10-CM | POA: Diagnosis present

## 2019-12-24 DIAGNOSIS — F909 Attention-deficit hyperactivity disorder, unspecified type: Secondary | ICD-10-CM | POA: Diagnosis not present

## 2019-12-24 DIAGNOSIS — Y95 Nosocomial condition: Secondary | ICD-10-CM | POA: Diagnosis present

## 2019-12-24 DIAGNOSIS — Z4659 Encounter for fitting and adjustment of other gastrointestinal appliance and device: Secondary | ICD-10-CM

## 2019-12-24 DIAGNOSIS — Z885 Allergy status to narcotic agent status: Secondary | ICD-10-CM | POA: Diagnosis not present

## 2019-12-24 LAB — CBC WITH DIFFERENTIAL/PLATELET
Abs Immature Granulocytes: 0.33 10*3/uL — ABNORMAL HIGH (ref 0.00–0.07)
Basophils Absolute: 0.1 10*3/uL (ref 0.0–0.1)
Basophils Relative: 1 %
Eosinophils Absolute: 0.1 10*3/uL (ref 0.0–0.5)
Eosinophils Relative: 1 %
HCT: 57.7 % — ABNORMAL HIGH (ref 39.0–52.0)
Hemoglobin: 17.2 g/dL — ABNORMAL HIGH (ref 13.0–17.0)
Immature Granulocytes: 2 %
Lymphocytes Relative: 51 %
Lymphs Abs: 6.9 10*3/uL — ABNORMAL HIGH (ref 0.7–4.0)
MCH: 28.9 pg (ref 26.0–34.0)
MCHC: 29.8 g/dL — ABNORMAL LOW (ref 30.0–36.0)
MCV: 96.8 fL (ref 80.0–100.0)
Monocytes Absolute: 1.1 10*3/uL — ABNORMAL HIGH (ref 0.1–1.0)
Monocytes Relative: 8 %
Neutro Abs: 4.9 10*3/uL (ref 1.7–7.7)
Neutrophils Relative %: 37 %
Platelets: 271 10*3/uL (ref 150–400)
RBC: 5.96 MIL/uL — ABNORMAL HIGH (ref 4.22–5.81)
RDW: 13.5 % (ref 11.5–15.5)
WBC: 13.5 10*3/uL — ABNORMAL HIGH (ref 4.0–10.5)
nRBC: 0 % (ref 0.0–0.2)

## 2019-12-24 LAB — URINALYSIS, ROUTINE W REFLEX MICROSCOPIC
Bilirubin Urine: NEGATIVE
Glucose, UA: 50 mg/dL — AB
Ketones, ur: NEGATIVE mg/dL
Leukocytes,Ua: NEGATIVE
Nitrite: NEGATIVE
Protein, ur: >=300 mg/dL — AB
Specific Gravity, Urine: 1.015 (ref 1.005–1.030)
pH: 5 (ref 5.0–8.0)

## 2019-12-24 LAB — LACTIC ACID, PLASMA
Lactic Acid, Venous: >11 mmol/L (ref 0.5–1.9)
Lactic Acid, Venous: 6 mmol/L (ref 0.5–1.9)
Lactic Acid, Venous: 2 mmol/L (ref 0.5–1.9)
Lactic Acid, Venous: 2.8 mmol/L (ref 0.5–1.9)

## 2019-12-24 LAB — COMPREHENSIVE METABOLIC PANEL
ALT: 19 U/L (ref 0–44)
AST: 34 U/L (ref 15–41)
Albumin: 5.1 g/dL — ABNORMAL HIGH (ref 3.5–5.0)
Alkaline Phosphatase: 51 U/L (ref 38–126)
Anion gap: 30 — ABNORMAL HIGH (ref 5–15)
BUN: 16 mg/dL (ref 6–20)
CO2: 10 mmol/L — ABNORMAL LOW (ref 22–32)
Calcium: 9.9 mg/dL (ref 8.9–10.3)
Chloride: 98 mmol/L (ref 98–111)
Creatinine, Ser: 1.59 mg/dL — ABNORMAL HIGH (ref 0.61–1.24)
GFR calc Af Amer: >60 mL/min (ref >60)
GFR calc non Af Amer: 60 mL/min — ABNORMAL LOW (ref >60)
Glucose, Bld: 216 mg/dL — ABNORMAL HIGH (ref 70–99)
Potassium: 4.6 mmol/L (ref 3.5–5.1)
Sodium: 138 mmol/L (ref 135–145)
Total Bilirubin: 0.1 mg/dL — ABNORMAL LOW (ref 0.3–1.2)
Total Protein: 7.8 g/dL (ref 6.5–8.1)

## 2019-12-24 LAB — POCT I-STAT 7, (LYTES, BLD GAS, ICA,H+H)
Acid-base deficit: 5 mmol/L — ABNORMAL HIGH (ref 0.0–2.0)
Bicarbonate: 21.6 mmol/L (ref 20.0–28.0)
Calcium, Ion: 1.23 mmol/L (ref 1.15–1.40)
HCT: 48 % (ref 39.0–52.0)
Hemoglobin: 16.3 g/dL (ref 13.0–17.0)
O2 Saturation: 99 %
Potassium: 5.3 mmol/L — ABNORMAL HIGH (ref 3.5–5.1)
Sodium: 137 mmol/L (ref 135–145)
TCO2: 23 mmol/L (ref 22–32)
pCO2 arterial: 43.1 mmHg (ref 32.0–48.0)
pH, Arterial: 7.309 — ABNORMAL LOW (ref 7.350–7.450)
pO2, Arterial: 155 mmHg — ABNORMAL HIGH (ref 83.0–108.0)

## 2019-12-24 LAB — MRSA PCR SCREENING: MRSA by PCR: NEGATIVE

## 2019-12-24 LAB — BRAIN NATRIURETIC PEPTIDE: B Natriuretic Peptide: 76 pg/mL (ref 0.0–100.0)

## 2019-12-24 LAB — TRIGLYCERIDES: Triglycerides: 119 mg/dL (ref <150)

## 2019-12-24 LAB — GLUCOSE, CAPILLARY
Glucose-Capillary: 100 mg/dL — ABNORMAL HIGH (ref 70–99)
Glucose-Capillary: 90 mg/dL (ref 70–99)

## 2019-12-24 LAB — RAPID URINE DRUG SCREEN, HOSP PERFORMED
Amphetamines: NOT DETECTED
Barbiturates: NOT DETECTED
Benzodiazepines: POSITIVE — AB
Cocaine: NOT DETECTED
Opiates: NOT DETECTED
Tetrahydrocannabinol: POSITIVE — AB

## 2019-12-24 LAB — PROTIME-INR
INR: 1.1 (ref 0.8–1.2)
Prothrombin Time: 13.8 seconds (ref 11.4–15.2)

## 2019-12-24 LAB — TROPONIN I (HIGH SENSITIVITY)
Troponin I (High Sensitivity): 7 ng/L (ref <18)
Troponin I (High Sensitivity): 24 ng/L — ABNORMAL HIGH (ref <18)

## 2019-12-24 LAB — RESPIRATORY PANEL BY RT PCR (FLU A&B, COVID)
Influenza A by PCR: NEGATIVE
Influenza B by PCR: NEGATIVE
SARS Coronavirus 2 by RT PCR: NEGATIVE

## 2019-12-24 LAB — VALPROIC ACID LEVEL: Valproic Acid Lvl: 39 ug/mL — ABNORMAL LOW (ref 50.0–100.0)

## 2019-12-24 LAB — CBG MONITORING, ED: Glucose-Capillary: 200 mg/dL — ABNORMAL HIGH (ref 70–99)

## 2019-12-24 LAB — BASIC METABOLIC PANEL
Anion gap: 11 (ref 5–15)
BUN: 18 mg/dL (ref 6–20)
CO2: 25 mmol/L (ref 22–32)
Calcium: 9.1 mg/dL (ref 8.9–10.3)
Chloride: 104 mmol/L (ref 98–111)
Creatinine, Ser: 2.05 mg/dL — ABNORMAL HIGH (ref 0.61–1.24)
GFR calc Af Amer: 51 mL/min — ABNORMAL LOW (ref >60)
GFR calc non Af Amer: 44 mL/min — ABNORMAL LOW (ref >60)
Glucose, Bld: 92 mg/dL (ref 70–99)
Potassium: 4.5 mmol/L (ref 3.5–5.1)
Sodium: 140 mmol/L (ref 135–145)

## 2019-12-24 LAB — HEMOGLOBIN A1C
Hgb A1c MFr Bld: 5.8 % — ABNORMAL HIGH (ref 4.8–5.6)
Mean Plasma Glucose: 119.76 mg/dL

## 2019-12-24 LAB — TSH: TSH: 2.542 u[IU]/mL (ref 0.350–4.500)

## 2019-12-24 LAB — MAGNESIUM: Magnesium: 2.8 mg/dL — ABNORMAL HIGH (ref 1.7–2.4)

## 2019-12-24 LAB — LIPASE, BLOOD: Lipase: 26 U/L (ref 11–51)

## 2019-12-24 LAB — PHOSPHORUS: Phosphorus: 6.8 mg/dL — ABNORMAL HIGH (ref 2.5–4.6)

## 2019-12-24 LAB — ETHANOL: Alcohol, Ethyl (B): <10 mg/dL (ref <10)

## 2019-12-24 MED ORDER — FAMOTIDINE IN NACL 20-0.9 MG/50ML-% IV SOLN
20.0000 mg | Freq: Two times a day (BID) | INTRAVENOUS | Status: DC
  Administered 2019-12-24 – 2019-12-27 (×6): 20 mg via INTRAVENOUS
  Filled 2019-12-24 (×6): qty 50

## 2019-12-24 MED ORDER — ESLICARBAZEPINE ACETATE 800 MG PO TABS
1200.0000 mg | ORAL_TABLET | Freq: Every day | ORAL | Status: DC
  Administered 2019-12-24 – 2019-12-28 (×5): 1200 mg
  Filled 2019-12-24 (×7): qty 1.5

## 2019-12-24 MED ORDER — CHLORHEXIDINE GLUCONATE CLOTH 2 % EX PADS
6.0000 | MEDICATED_PAD | Freq: Every day | CUTANEOUS | Status: DC
  Administered 2019-12-24 – 2020-01-01 (×9): 6 via TOPICAL

## 2019-12-24 MED ORDER — PROPOFOL 1000 MG/100ML IV EMUL
INTRAVENOUS | Status: AC
  Filled 2019-12-24: qty 100

## 2019-12-24 MED ORDER — ETOMIDATE 2 MG/ML IV SOLN
INTRAVENOUS | Status: AC | PRN
  Administered 2019-12-24: 20 mg via INTRAVENOUS

## 2019-12-24 MED ORDER — MIDAZOLAM BOLUS VIA INFUSION
10.0000 mg | Freq: Once | INTRAVENOUS | Status: AC
  Administered 2019-12-24: 10 mg via INTRAVENOUS
  Filled 2019-12-24: qty 10

## 2019-12-24 MED ORDER — ACETAMINOPHEN 325 MG PO TABS
650.0000 mg | ORAL_TABLET | Freq: Four times a day (QID) | ORAL | Status: DC | PRN
  Administered 2019-12-24 – 2019-12-25 (×2): 650 mg via ORAL
  Filled 2019-12-24 (×3): qty 2

## 2019-12-24 MED ORDER — LORAZEPAM 2 MG/ML IJ SOLN
2.0000 mg | Freq: Once | INTRAMUSCULAR | Status: AC
  Administered 2019-12-24: 2 mg via INTRAVENOUS

## 2019-12-24 MED ORDER — MIDAZOLAM 50MG/50ML (1MG/ML) PREMIX INFUSION
1.0000 mg/h | INTRAVENOUS | Status: DC
  Administered 2019-12-24 (×2): 10 mg/h via INTRAVENOUS
  Administered 2019-12-24: 20 mg/h via INTRAVENOUS
  Administered 2019-12-24: 10 mg/h via INTRAVENOUS
  Administered 2019-12-24: 20 mg/h via INTRAVENOUS
  Administered 2019-12-25: 02:00:00 10 mg/h via INTRAVENOUS
  Administered 2019-12-25: 16 mg/h via INTRAVENOUS
  Administered 2019-12-25: 07:00:00 10 mg/h via INTRAVENOUS
  Administered 2019-12-25: 13:00:00 20 mg/h via INTRAVENOUS
  Administered 2019-12-26: 5 mg/h via INTRAVENOUS
  Administered 2019-12-26: 4 mg/h via INTRAVENOUS
  Administered 2019-12-27: 2 mg/h via INTRAVENOUS
  Administered 2019-12-27: 5 mg/h via INTRAVENOUS
  Filled 2019-12-24: qty 50
  Filled 2019-12-24: qty 100
  Filled 2019-12-24 (×12): qty 50

## 2019-12-24 MED ORDER — CHLORHEXIDINE GLUCONATE 0.12% ORAL RINSE (MEDLINE KIT)
15.0000 mL | Freq: Two times a day (BID) | OROMUCOSAL | Status: DC
  Administered 2019-12-24 – 2019-12-28 (×8): 15 mL via OROMUCOSAL

## 2019-12-24 MED ORDER — ENOXAPARIN SODIUM 40 MG/0.4ML ~~LOC~~ SOLN
40.0000 mg | SUBCUTANEOUS | Status: DC
  Administered 2019-12-24 – 2019-12-31 (×8): 40 mg via SUBCUTANEOUS
  Filled 2019-12-24 (×8): qty 0.4

## 2019-12-24 MED ORDER — VALPROATE SODIUM 500 MG/5ML IV SOLN
1600.0000 mg | Freq: Once | INTRAVENOUS | Status: AC
  Administered 2019-12-24: 1600 mg via INTRAVENOUS
  Filled 2019-12-24: qty 16

## 2019-12-24 MED ORDER — INSULIN ASPART 100 UNIT/ML ~~LOC~~ SOLN
0.0000 [IU] | SUBCUTANEOUS | Status: DC
  Administered 2019-12-30: 1 [IU] via SUBCUTANEOUS

## 2019-12-24 MED ORDER — LORAZEPAM 2 MG/ML IJ SOLN
INTRAMUSCULAR | Status: AC
  Filled 2019-12-24: qty 1

## 2019-12-24 MED ORDER — POTASSIUM CHLORIDE 2 MEQ/ML IV SOLN
INTRAVENOUS | Status: DC
  Filled 2019-12-24 (×7): qty 1000

## 2019-12-24 MED ORDER — VALPROIC ACID 250 MG/5ML PO SOLN
1000.0000 mg | Freq: Three times a day (TID) | ORAL | Status: DC
  Administered 2019-12-24 – 2019-12-27 (×8): 1000 mg
  Filled 2019-12-24 (×9): qty 20

## 2019-12-24 MED ORDER — SUCCINYLCHOLINE CHLORIDE 20 MG/ML IJ SOLN
INTRAMUSCULAR | Status: AC | PRN
  Administered 2019-12-24: 100 mg via INTRAVENOUS

## 2019-12-24 MED ORDER — PROPOFOL 1000 MG/100ML IV EMUL
5.0000 ug/kg/min | INTRAVENOUS | Status: DC
  Administered 2019-12-24: 15 ug/kg/min via INTRAVENOUS
  Administered 2019-12-25: 70 ug/kg/min via INTRAVENOUS
  Administered 2019-12-26: 30 ug/kg/min via INTRAVENOUS
  Administered 2019-12-26: 60 ug/kg/min via INTRAVENOUS
  Administered 2019-12-27: 25 ug/kg/min via INTRAVENOUS
  Administered 2019-12-27: 20 ug/kg/min via INTRAVENOUS
  Administered 2019-12-28: 15 ug/kg/min via INTRAVENOUS
  Filled 2019-12-24 (×13): qty 100

## 2019-12-24 MED ORDER — PROPOFOL 1000 MG/100ML IV EMUL
5.0000 ug/kg/min | INTRAVENOUS | Status: DC
  Administered 2019-12-24: 5 ug/kg/min via INTRAVENOUS

## 2019-12-24 MED ORDER — ORAL CARE MOUTH RINSE
15.0000 mL | OROMUCOSAL | Status: DC
  Administered 2019-12-24 – 2019-12-28 (×37): 15 mL via OROMUCOSAL

## 2019-12-24 MED ORDER — ONDANSETRON HCL 4 MG/2ML IJ SOLN
4.0000 mg | Freq: Four times a day (QID) | INTRAMUSCULAR | Status: DC | PRN
  Filled 2019-12-24: qty 2

## 2019-12-24 NOTE — ED Notes (Signed)
EEG tech at bedside. 

## 2019-12-24 NOTE — Progress Notes (Signed)
STAT EEG completed, notified Neuro, results pending

## 2019-12-24 NOTE — H&P (Signed)
NAME:  Dylan Moon, MRN:  [id number], DOB:  01-01-1996, LOS: 0 ADMISSION DATE:  12/24/2019, CONSULTATION DATE:  12/24/2019 REFERRING MD: Broadus John, MD, CHIEF COMPLAINT: Seizure   Brief History   Dylan Moon is a 24 y.o male with ADHD and epilepsy who presented to the emergency department with status epilepticus. He was subsequently intubated and admitted to the ICU for burst suppression.   History of present illness   Dylan Moon is a 24 y.o male with ADHD and epilepsy who presented to the emergency department after witnessed seizure like episode. Per the patient's mother, the patient was in his normal state of health until this morning when he got up to go to the bathroom. She has subsequently heard an abnormal sound and when she went to check on him found him on the bathroom floor having a seizure. EMS was called and he subsequently had another seizure in route to the hospital. Prior to this morning's events he had not been complaining of feeling ill. She denies fevers, chills, headaches, shortness of breath, chest pain, cough, rhinorrhea, myalgias, arthralgias, abdominal pain, nausea/vomiting, diarrhea, new rashes. She states that he is compliant with all of his AEDs. He is currently on Depakote ER 3000 mg qhs and eslicarbazepine 800 mg tabs 1.5 tabs per day. The mother states that he typically has a seizure at least monthly.  The patient does use marijuana intermittently but does not drink alcohol. She denies any other illicit substance use. She does acknowledge that he was out late the night before and returned home this morning around 1 AM.  Past Medical History   Past Medical History:  Diagnosis Date  . ADHD   . Scoliosis   . Seizures ([facility])    most recent 12/02/17   Significant Hospital Events   4/2 > Admitted for status epilepticus, intubated, started on burst suppression   Consults:  Neurology   Procedures:  Endotracheal intubation 4/2  Significant Diagnostic Tests:   CT  head 4/2:  1. No evidence of acute intracranial abnormality. 2. Left sphenoid sinus mucous retention cyst.  CT cervical spine 4/2:  1. No evidence of acute fracture to the cervical spine. 2. Nonspecific reversal of the expected cervical lordosis, which may be positional.  EEG 4/2: Abnormality -Continuous slow, generalized -Excessive beta, generalized  IMPRESSION: This study is suggestive of severe diffuse encephalopathy, nonspecific to etiology but most likely secondary to sedation. The excessive beta activity seen in the background is most likely due to the effect of benzodiazepine and is a benign EEG pattern. No seizures or epileptiform discharges were seen throughout the recording.  Micro Data:  SARs COVID > Pending   Antimicrobials:  NA   Interim history/subjective:  See above  Objective   Blood pressure (!) 148/79, pulse (!) 104, temperature 97.9 F (36.6 C), temperature source Axillary, resp. rate 13, height 6' (1.829 m), weight 81.6 kg, SpO2 100 %.    Vent Mode: PRVC FiO2 (%):  [100 %] 100 % Set Rate:  [14 bmp] 14 bmp Vt Set:  [620 mL] 620 mL PEEP:  [5 cmH20] 5 cmH20   Intake/Output Summary (Last 24 hours) at 12/24/2019 1528 Last data filed at 12/24/2019 1336 Gross per 24 hour  Intake 62.31 ml  Output -  Net 62.31 ml   Filed Weights   12/24/19 1056  Weight: 81.6 kg   Examination: General: Well nourished male, intubated and sedated HENT: Normocephalic, atraumatic, moist mucus membranes, pin point pupils  Pulm: Good air movement with  no wheezing or crackles  CV: RRR, no murmurs, no rubs  Abdomen: Active bowel sounds, soft, non-distended, no tenderness to palpation  Extremities: Pulses palpable in all extremities, no LE edema  Skin: Warm and dry  Neuro: Sedated and unresponsive, no clonus noted, no rigidity   Resolved Hospital Problem list   NA  Assessment & Plan:   Status Epilepticus subsequently intubated for airway protection and burst  suppression - Presented with 2 witnessed tonic clonic seizures. Status post 7.5 mg of Versed. - CT head negative - EEG negative for active epileptic discharge - UDS + THC, benzos per [place] - Continue Versed GTT at 10 mg per hour - Valproic acid level pending, start valproic acid 20 mg/kg IV x1, followed by valproic acid solution per OGT 1000 mg TID starting at 8 PM - Allow patient to use home eslicarbazepine. Start eslicarbazepine 517 mg tabs 1.5 tabs per day - Stop Propofol  - Continue full vent support for now. Currently on PRVC at 8cc/kg - Start LR at 100cc/hr and trend lactic acid  - Appreciate neurology consultation   AKI - Baseline creatinine of ~1.1, elevated to 1.6 on admission  - Likely hemodynamically mediated  - UA does show significant proteinuria but not true hematuria. Will need repeat UA after acute episode  - Start LR 100cc/hr IVF  - Avoid nephrotoxic medications   Best practice:  Diet: NPO, meds per NG Pain/Anxiety/Delirium protocol (if indicated): Versed  VAP protocol (if indicated): Per order set DVT prophylaxis: Lovenox GI prophylaxis: Famotidine Glucose control: CBGs q4 hours Mobility: Bed rest  Code Status: Full Family Communication: Mother updated at bedside Disposition: Neuro ICU  Labs   CBC: Recent Labs  Lab 12/24/19 1046 12/24/19 1207  WBC 13.5*  --   NEUTROABS 4.9  --   HGB 17.2* 16.3  HCT 57.7* 48.0  MCV 96.8  --   PLT 271  --     Basic Metabolic Panel: Recent Labs  Lab 12/24/19 1046 12/24/19 1207  NA 138 137  K 4.6 5.3*  CL 98  --   CO2 10*  --   GLUCOSE 216*  --   BUN 16  --   CREATININE 1.59*  --   CALCIUM 9.9  --   MG 2.8*  --   PHOS 6.8*  --    GFR: Estimated Creatinine Clearance: 78.6 mL/min (A) (by C-G formula based on SCr of 1.59 mg/dL (H)). Recent Labs  Lab 12/24/19 1046 12/24/19 1322  WBC 13.5*  --   LATICACIDVEN >11.0* 6.0*    Liver Function Tests: Recent Labs  Lab 12/24/19 1046  AST 34  ALT 19  ALKPHOS  51  BILITOT 0.1*  PROT 7.8  ALBUMIN 5.1*   Recent Labs  Lab 12/24/19 1046  LIPASE 26   No results for input(s): AMMONIA in the last 168 hours.  ABG    Component Value Date/Time   PHART 7.309 (L) 12/24/2019 1207   PCO2ART 43.1 12/24/2019 1207   PO2ART 155.0 (H) 12/24/2019 1207   HCO3 21.6 12/24/2019 1207   TCO2 23 12/24/2019 1207   ACIDBASEDEF 5.0 (H) 12/24/2019 1207   O2SAT 99.0 12/24/2019 1207     Coagulation Profile: Recent Labs  Lab 12/24/19 1046  INR 1.1    Cardiac Enzymes: No results for input(s): CKTOTAL, CKMB, CKMBINDEX, TROPONINI in the last 168 hours.  HbA1C: No results found for: HGBA1C  CBG: Recent Labs  Lab 12/24/19 1039  GLUCAP 200*    Review of Systems:  Unable to obtain due to critical illness  Past Medical History  He,  has a past medical history of ADHD, Scoliosis, and Seizures (HCC).   Surgical History    Past Surgical History:  Procedure Laterality Date  . NO PAST SURGERIES       Social History   reports that he has never smoked. He has never used smokeless tobacco. He reports current drug use. Drug: Marijuana. He reports that he does not drink alcohol.   Family History   His family history includes Cancer in an other family member; Diabetes in his father and mother; Hypertension in his mother; Seizures in his maternal grandfather.   Allergies Allergies  Allergen Reactions  . Keppra [Levetiracetam] Other (See Comments)    Irritability   . Tramadol Other (See Comments)    Contraindicated with current medications (??)  . Vimpat [Lacosamide] Other (See Comments)    Causes anger     Home Medications  Prior to Admission medications   Medication Sig Start Date End Date Taking? Authorizing Provider  APTIOM 800 MG TABS Take 1,200 mg by mouth daily. Taking 1.5 tablets = 1200mg  10/01/19  Yes [provider]  busPIRone (BUSPAR) 5 MG tablet Take 5 mg by mouth 3 (three) times daily. Taking 1 tablet daily   Yes [provider]  diclofenac Sodium (VOLTAREN) 1 % GEL Apply 4 g topically 4 (four) times daily as needed for pain. 10/01/19  Yes [provider]  divalproex (DEPAKOTE ER) 500 MG 24 hr tablet Take 6 tablets (3,000 mg total) by mouth at bedtime. Patient taking differently: Take 2,500 mg by mouth daily. Taking 5 tablets =2,500mg  daily 04/23/19 12/24/19 Yes 02/23/20, NP         Claiborne Rigg, MD  [facility] PGY3 Pager: [phone]

## 2019-12-24 NOTE — ED Provider Notes (Addendum)
[facility] EMERGENCY DEPARTMENT Provider Note   CSN: [id number] Arrival date & time: 12/24/19  1033     History No chief complaint on file.   Dylan Moon is a 24 y.o. male.  HPI Patient is brought in by EMS with history of seizure and fall.  Reportedly the patient was getting into a shower and started having a seizure.  He fell to the ground.  EMS reports 2 tonic-clonic seizures on route.  He was given midazolam 5 mg IM for the first seizure and midazolam 2.5 mg IV for the second seizure.  Cervical collar was placed.  On EMS arrival the patient is extremely tachypneic with gurgling secretions and airway and appearance of ongoing seizure with low amplitude tonic extension movements of the upper extremities.  Patient is in extremis.  Cannot give any history.    Past Medical History:  Diagnosis Date  . ADHD   . Scoliosis   . Seizures (HCC)    most recent 12/02/17    Patient Active Problem List   Diagnosis Date Noted  . Seizures (HCC) 07/10/2018  . 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 Noncompliance with medication regimen 12/11/2017  . Seizure (HCC) 05/24/2016  . Dehydration, mild 05/24/2016  . AKI (acute kidney injury) (HCC) 05/24/2016  . Marijuana use 05/24/2016    Past Surgical History:  Procedure Laterality Date  . NO PAST SURGERIES         Family History  Problem Relation Age of Onset  . Diabetes Mother   . Hypertension Mother   . Cancer Other   . Diabetes Father   . Seizures Maternal Grandfather     Social History   Tobacco Use  . Smoking status: Never Smoker  . Smokeless tobacco: Never Used  Substance Use Topics  . Alcohol use: No  . Drug use: Yes    Types: Marijuana    Comment: Daily use of marijuana.    Home Medications Prior to Admission medications   Medication Sig Start Date End Date Taking? Authorizing Provider  APTIOM 800 MG TABS Take 800 mg by mouth daily. 10/01/19   [provider]  divalproex (DEPAKOTE ER) 500 MG 24 hr tablet Take 6 tablets (3,000 mg total) by mouth at bedtime. Patient not taking: Reported on 10/20/2019 04/23/19 05/23/19  Claiborne Rigg, NP    Allergies    Keppra [levetiracetam], Tramadol, and Vimpat [lacosamide]  Review of Systems   Review of Systems L5 cannot obtain review of systems due to patient condition. Physical Exam Updated Vital Signs BP (!) 188/117   Pulse (!) 145   Temp 97.9 F (36.6 C) (Axillary)   Resp (!) 24   Ht 6' (1.829 m)   Wt 81.6 kg   SpO2 93%   BMI 24.41 kg/m   Physical Exam Constitutional:      Comments: Patient arrives in severe distress.  Extreme tachypnea with rhonchorous sounds in the airway.  Appears to be having clonic seizure activity of the upper extremities.  HENT:     Head:     Comments: No evident facial trauma or head injury.  Patient has a nasal trumpet in the right nare.  Teeth are tightly clenched.  Cannot appreciate hematoma on the head.  Patient does have extremely thick hair which might obscure some scalp laceration or hematoma.  No visible blood present.    Nose:     Comments: Right nasal trumpet.  Frothy mucousy secretions in  the nose.    Mouth/Throat:     Comments: Frothy mucousy secretions in the airway.  Teeth are tightly clenched.  Cannot assess tongue and posterior airway on first assessment. Eyes:     Comments: Pupils constricted and symmetric.  Patient is not following any kind of commands for extraocular motions.  Neck:     Comments: Patient is in cervical collar. Cardiovascular:     Comments: Extreme tachycardia.  Heart regular.  Heart sounds largely obscured by respiratory noise. Pulmonary:     Comments: Severe tachypnea.  Gurgling secretions audible in the airway.  Rhonchi and crackles bilateral breath sounds symmetric. Abdominal:     Comments: Abdomen is soft and nondistended.  Musculoskeletal:     Comments: No apparent extremity injuries.  Extremities are  well-developed with musculature and no obvious abrasions, deformities or hematomas.  Distal extremities are warm and dry with 2+ pulses.  Neurological:     Comments: Patient is an active status epilepticus.  He does not answer any questions.  No pain response.  He is exhibiting some extension like seizure activity of the upper extremities.     ED Results / Procedures / Treatments   Labs (all labs ordered are listed, but only abnormal results are displayed) Labs Reviewed  CBG MONITORING, ED - Abnormal; Notable for the following components:      Result Value   Glucose-Capillary 200 (*)    All other components within normal limits  COMPREHENSIVE METABOLIC PANEL  ETHANOL  LIPASE, BLOOD  BRAIN NATRIURETIC PEPTIDE  LACTIC ACID, PLASMA  LACTIC ACID, PLASMA  CBC WITH DIFFERENTIAL/PLATELET  PROTIME-INR  URINALYSIS, ROUTINE W REFLEX MICROSCOPIC  RAPID URINE DRUG SCREEN, HOSP PERFORMED  BLOOD GAS, ARTERIAL  MAGNESIUM  PHOSPHORUS  TSH  VALPROIC ACID LEVEL  TRIGLYCERIDES  TROPONIN I (HIGH SENSITIVITY)    EKG Muse will not allow read or import of EKG at this time. Sinus tachycardia 100. Some peaking of T waves. Rate related changes but otherwise similar to previous EKG.  Radiology DG Chest Portable 1 View  Result Date: 12/24/2019 CLINICAL DATA:  Hypoxia EXAM: PORTABLE CHEST 1 VIEW COMPARISON:  October 15, 2008 FINDINGS: Endotracheal tube tip is 4.8 cm above the carina. Nasogastric tube tip and side port are in the stomach. No pneumothorax. The lungs are clear. The heart size and pulmonary vascularity are normal. No adenopathy. No bone lesions. IMPRESSION: Tube positions as described without pneumothorax. Lungs clear. Cardiac silhouette within normal limits. Electronically Signed   By: Bretta Bang III M.D.   On: 12/24/2019 11:23    Procedures Procedure Name: Intubation Date/Time: 12/24/2019 12:02 PM Performed by: Arby Barrette, MD Pre-anesthesia Checklist: Patient identified,  Patient being monitored, Emergency Drugs available and Suction available Oxygen Delivery Method: Ambu bag Preoxygenation: Pre-oxygenation with 100% oxygen Induction Type: Rapid sequence and Cricoid Pressure applied Ventilation: Mask ventilation without difficulty Laryngoscope Size: Glidescope and 3 Tube size: 7.5 mm Number of attempts: 1 Placement Confirmation: ETT inserted through vocal cords under direct vision,  CO2 detector and Breath sounds checked- equal and bilateral Tube secured with: ETT holder Dental Injury: Injury to lip  Comments: Patient preoxygenated by bag-valve-mask.  Once patient was sedated, I was able to bring oxygen saturation up to 99% with bag-valve-mask.  There were significant secretions in the airway.  Patient required suctioning.  Direct visualization of the glottis.  Able to pass tube on first attempt.  Good color change.  Auscultation is for symmetric, coarse breath sounds.  Very minor trauma to the lower  lip.  No significant laceration requiring any repair.      (including critical care time) CRITICAL CARE Performed by: Charlesetta Shanks   Total critical care time: 35 minutes  Critical care time was exclusive of separately billable procedures and treating other patients.  Critical care was necessary to treat or prevent imminent or life-threatening deterioration.  Critical care was time spent personally by me on the following activities: development of treatment plan with patient and/or surrogate as well as nursing, discussions with consultants, evaluation of patient's response to treatment, examination of patient, obtaining history from patient or surrogate, ordering and performing treatments and interventions, ordering and review of laboratory studies, ordering and review of radiographic studies, pulse oximetry and re-evaluation of patient's condition.   Medications Ordered in ED Medications  propofol (DIPRIVAN) 1000 MG/100ML infusion ( Intravenous Canceled  Entry 12/24/19 1123)  propofol (DIPRIVAN) 1000 MG/100ML infusion (20 mcg/kg/min  81.6 kg Intravenous Rate/Dose Change 12/24/19 1103)  LORazepam (ATIVAN) 2 MG/ML injection (  Canceled Entry 12/24/19 1122)  midazolam (VERSED) 50 mg/50 mL (1 mg/mL) premix infusion (10 mg/hr Intravenous New Bag/Given 12/24/19 1126)  valproate (DEPACON) 1,600 mg in dextrose 5 % 50 mL IVPB (has no administration in time range)  valproic acid (DEPAKENE) 250 MG/5ML solution 1,000 mg (has no administration in time range)  LORazepam (ATIVAN) injection 2 mg (2 mg Intravenous Given 12/24/19 1044)  etomidate (AMIDATE) injection (20 mg Intravenous Given 12/24/19 1047)  succinylcholine (ANECTINE) injection (100 mg Intravenous Given 12/24/19 1047)  LORazepam (ATIVAN) injection 2 mg (2 mg Intravenous Given 12/24/19 1054)  LORazepam (ATIVAN) injection 2 mg (2 mg Intravenous Given 12/24/19 1109)  midazolam (VERSED) bolus via infusion 10 mg (10 mg Intravenous Bolus from Bag 12/24/19 1125)    ED Course  I have reviewed the triage vital signs and the nursing notes.  Pertinent labs & imaging results that were available during my care of the patient were reviewed by me and considered in my medical decision making (see chart for details).  Clinical Course as of Dec 28 2328  Fri Dec 24, 2019  1454 Consult: Critical care for admission.   [MP]    Clinical Course User Index [MP] Charlesetta Shanks, MD   MDM Rules/Calculators/A&P                     Consult: Dr. Cheral Marker for status epilepticus.  Seeing patient at bedside.  Patient presents as outlined above.  He presents with status epilepticus.  Limited history as to extent of possible head trauma.  On exam, no significant visible trauma.  Patient has known seizure history.  He was hypoxic with secretions in the airway.  Patient was immediately prepared for intubation.  Improved oxygenation with bag-valve-mask.  Patient intubated on first attempt.  Subsequently placed on propofol drip.  Hypertension  improving with sedation.  Patient was also given Ativan 2mg  IV x2 doses.  Initially patient was sedated and no visible ongoing seizure activity.  He did have a repeat seizure activity, Versed drip initiated as well.  Dr. Cheral Marker now managing seizure control.  Airway has remained stable with 100% oxygenation. Final Clinical Impression(s) / ED Diagnoses Final diagnoses:  Status epilepticus ([place])  Acute respiratory failure with hypoxia [facility])    Rx / DC Orders ED Discharge Orders    None       Charlesetta Shanks, MD 12/24/19 1209    Charlesetta Shanks, MD 12/29/19 [phone]

## 2019-12-24 NOTE — ED Notes (Signed)
C-spine collar removed per MD Pfeiffer order.

## 2019-12-24 NOTE — Progress Notes (Signed)
vLTM EEG started following spot EEG. Same leads

## 2019-12-24 NOTE — ED Triage Notes (Signed)
Pt bib ems from home with seizures, hx of same. 2 witnessed tonic clonic seizures with ems. Given 5mg  midazolam IM for first seizure and 2.5mg  midazolam IV for the second seizure. 20g LFA. CCollar in place. Pt unresponsive on arrival with NRB. Some oral trauma noted.  187/81 HR 72 CBG 146 94% NRB RR 28

## 2019-12-24 NOTE — Consult Note (Signed)
NEURO HOSPITALIST CONSULT NOTE   Requesting physician: Dr. Donnald Garre  Reason for Consult: Status epilepticus  History obtained from:  EDP and Chart    HPI:                                                                                                                                          Dylan Moon is an 24 y.o. male with known history of ADHD and seizures, presenting via EMS in status epilepticus. He had 2 witnessed tonic clonic seizures with EMS and was given 5 mg IM Versed for the first seizure, followed by 2.5 mg IV midazolam for the second seizure. The patient was hypertensive on arrival with BP of 187/81. HR was 72, CBG 146 and sats 94% on NRB mask with RR of 28. He had to be "bagged up" to improve his saturations, prior to intubation. He was sedated with propofol for intubation but exhibited recurrent stiffening c/w seizure. 2 mg IV Ativan administered and propofol switched to Versed titratable drip starting at a rate of 10 mg/hr following a 10 mg loading dose.   The patient's mother states that he is compliant with his home anticonvulsant regimen of Depakote ER 3000 mg qhs and eslicarbazepine 800 mg tabs 1.5 tabs per day. Mother states that he smokes marijuana but does not drink EtOH. She is not aware of any other substance use.   Mother states that he is seen at [facility] for his epilepsy. She describes arrangements to the effect that he is due to have specialized imaging to assess for a possible surgically removable epileptogenic lesion.   Mother notes that patient returned home from being with friends at about 1 AM last night and that she heard nothing unusual at that time. He got up this AM and was behaving normally. Mother went to a neighbor's house and on returning home, saw that EMS was there. On entering the house mother noted that patient was lying on the floor of the bathroom near the tub with EMS present. Apparently he had been on the telephone or on  Facetime with a girlfriend who had seen or heard him seize and then called EMS. 2 additional seizures were witnessed by EMS as noted above.   The patient has allergies to Keppra and Vimpat.   Past Medical History:  Diagnosis Date  . ADHD   . Scoliosis   . Seizures (HCC)    most recent 12/02/17    Past Surgical History:  Procedure Laterality Date  . NO PAST SURGERIES      Family History  Problem Relation Age of Onset  . Diabetes Mother   . Hypertension Mother   . Cancer Other   . Diabetes Father   . Seizures Maternal Grandfather  Social History:  reports that he has never smoked. He has never used smokeless tobacco. He reports current drug use. Drug: Marijuana. He reports that he does not drink alcohol.  Allergies  Allergen Reactions  . Keppra [Levetiracetam] Other (See Comments)    Irritability   . Tramadol Other (See Comments)    Contraindicated with current medications (??)  . Vimpat [Lacosamide] Other (See Comments)    Causes anger    HOME MEDICATIONS:                                                                                                                      No current facility-administered medications on file prior to encounter.   Current Outpatient Medications on File Prior to Encounter  Medication Sig Dispense Refill  . APTIOM 800 MG TABS Take 800 mg by mouth daily.    . divalproex (DEPAKOTE ER) 500 MG 24 hr tablet Take 6 tablets (3,000 mg total) by mouth at bedtime. (Patient not taking: Reported on 10/20/2019) 180 tablet 3     ROS:                                                                                                                                       Unable to obtain due to intubation and sedation.    Blood pressure (!) 188/117, pulse (!) 145, temperature 97.9 F (36.6 C), temperature source Axillary, resp. rate (!) 24, height 6' (1.829 m), weight 81.6 kg, SpO2 93 %.   General Examination:                                                                                                        Physical Exam  HEENT-  [place]/AT    Lungs- Respirations unlabored Extremities- No edema  Neurological Examination Mental Status: Intubated and sedated. Will move head and flex LLE intermittently, semipurposefully. Not following any commands. No attempts to communicate in the context of intubation/sedation.  Cranial Nerves: II: Pupils 4 mm >>  2 mm bilaterally. No blink to threat.   III,IV, VI: No ptosis. Unable to test oculocephalic reflex.  V,VII: Face flaccidly symmetric.  VIII: No responses to auditory stimuli IX,X: Intubated XI: In C-collar XII: Intubated Motor: Flaccid tone BUE, increased extensor tone BLE when at rest. Had one additional seizure witnessed by Neurology with generalized tonic activity for which Ativan was administered with cessation of activity.  Sensory: Slight upper extremity movement to pinch. No movement of BLE to plantar stimulation.  Deep Tendon Reflexes: 0 bilateral brachioradialis and biceps. 2+ patellae and achilles bilaterally.  Plantars: Mute bilaterally  Cerebellar/Gait: Unable to assess   Lab Results: Basic Metabolic Panel: No results for input(s): NA, K, CL, CO2, GLUCOSE, BUN, CREATININE, CALCIUM, MG, PHOS in the last 168 hours.  CBC: No results for input(s): WBC, NEUTROABS, HGB, HCT, MCV, PLT in the last 168 hours.  Cardiac Enzymes: No results for input(s): CKTOTAL, CKMB, CKMBINDEX, TROPONINI in the last 168 hours.  Lipid Panel: No results for input(s): CHOL, TRIG, HDL, CHOLHDL, VLDL, LDLCALC in the last 168 hours.  Imaging: No results found.   12/24/2019, 11:01 AM  Assessment: 24 year old male with known seizure disorder presenting with status epilepticus 1. Status epilepticus. Not currently seizing after initiation of Versed.  2. HIstory of seizures on Depakote ER 3000 mg qhs and Aptiom (eslicarbazepine) 6812 mg qd.   Recommendations: 1. Stopping propofol due to side  effect of BP reduction.  2. Versed gtt at initial rate of 10 mg/hr following IV load of 10 mg (ordered). 3. 2 mg IV Ativan given for clinical seizure activity at 11:10 AM, after intubation.  4. STAT CT head and neck 5. STAT EEG after CT scans. May need to be converted to an LTM EEG pending results of the spot EEG.  6. Valproic acid level is pending. 7. Valproic acid load 20 mg/kg IV x 1, followed by valproic acid solution per OGT 1000 mg TID starting at 8 PM.  8. Admitting team to continue to attempt to get in touch with the patient's mother.  9. Tox screen 10. EtOH level  11. Mother is bringing in eslicarbazepine tablets, which are not available through the pharmacy and [facility]. When she brings in the tablets, they should be given to RN, forms filled out and the tablets given to Pharmacy for administration as follows: One and a half 800 mg tablets per OGT qd.   60 minutes spent in the emergent neurological evaluation and management of this critically ill patient.   Electronically signed: Dr. Kerney Elbe

## 2019-12-24 NOTE — Procedures (Signed)
Patient Name: Dylan Moon  MRN: [id number]  Epilepsy Attending: Charlsie Quest  Referring Physician/Provider: Dr. Caryl Pina Date: 12/24/2019 Duration: 25.68 mins  Patient history: 24 year old male with history of epilepsy presented in status epilepticus.  EEG to evaluate for subclinical seizures.  Level of alertness: Comatose/sedated  AEDs during EEG study: Depakote, Versed, propofol  Technical aspects: This EEG study was done with scalp electrodes positioned according to the 10-20 International system of electrode placement. Electrical activity was acquired at a sampling rate of 500Hz  and reviewed with a high frequency filter of 70Hz  and a low frequency filter of 1Hz . EEG data were recorded continuously and digitally stored.   Description: EEG showed continuous generalized 2 to 3 Hz delta activity with overriding 15 to 18 Hz, 2-3 uV beta activity distributed symmetrically and diffusely.  EEG was reactive to tactile stimulation. Hyperventilation and photic stimulation were not performed.  Abnormality -Continuous slow, generalized -Excessive beta, generalized  IMPRESSION: This study is suggestive of severe diffuse encephalopathy, nonspecific to etiology but most likely secondary to sedation. The excessive beta activity seen in the background is most likely due to the effect of benzodiazepine and is a benign EEG pattern. No seizures or epileptiform discharges were seen throughout the recording.  Dr was notified.   [name] 

## 2019-12-25 ENCOUNTER — Inpatient Hospital Stay: Payer: Self-pay

## 2019-12-25 ENCOUNTER — Inpatient Hospital Stay (HOSPITAL_COMMUNITY): Payer: Medicaid Other

## 2019-12-25 DIAGNOSIS — N179 Acute kidney failure, unspecified: Secondary | ICD-10-CM

## 2019-12-25 LAB — BASIC METABOLIC PANEL
Anion gap: 16 — ABNORMAL HIGH (ref 5–15)
BUN: 20 mg/dL (ref 6–20)
CO2: 19 mmol/L — ABNORMAL LOW (ref 22–32)
Calcium: 9 mg/dL (ref 8.9–10.3)
Chloride: 104 mmol/L (ref 98–111)
Creatinine, Ser: 2.37 mg/dL — ABNORMAL HIGH (ref 0.61–1.24)
GFR calc Af Amer: 43 mL/min — ABNORMAL LOW (ref >60)
GFR calc non Af Amer: 37 mL/min — ABNORMAL LOW (ref >60)
Glucose, Bld: 140 mg/dL — ABNORMAL HIGH (ref 70–99)
Potassium: 4.6 mmol/L (ref 3.5–5.1)
Sodium: 139 mmol/L (ref 135–145)

## 2019-12-25 LAB — URINALYSIS, ROUTINE W REFLEX MICROSCOPIC
Bilirubin Urine: NEGATIVE
Glucose, UA: NEGATIVE mg/dL
Ketones, ur: NEGATIVE mg/dL
Leukocytes,Ua: NEGATIVE
Nitrite: NEGATIVE
Protein, ur: 30 mg/dL — AB
RBC / HPF: >50 RBC/hpf — ABNORMAL HIGH (ref 0–5)
Specific Gravity, Urine: 1.016 (ref 1.005–1.030)
pH: 5 (ref 5.0–8.0)

## 2019-12-25 LAB — POCT I-STAT 7, (LYTES, BLD GAS, ICA,H+H)
Acid-base deficit: 1 mmol/L (ref 0.0–2.0)
Bicarbonate: 21.6 mmol/L (ref 20.0–28.0)
Calcium, Ion: 1.18 mmol/L (ref 1.15–1.40)
HCT: 50 % (ref 39.0–52.0)
Hemoglobin: 17 g/dL (ref 13.0–17.0)
O2 Saturation: 98 %
Patient temperature: 100.5
Potassium: 4.5 mmol/L (ref 3.5–5.1)
Sodium: 139 mmol/L (ref 135–145)
TCO2: 23 mmol/L (ref 22–32)
pCO2 arterial: 32.4 mmHg (ref 32.0–48.0)
pH, Arterial: 7.436 (ref 7.350–7.450)
pO2, Arterial: 107 mmHg (ref 83.0–108.0)

## 2019-12-25 LAB — CBC
HCT: 46 % (ref 39.0–52.0)
Hemoglobin: 15.1 g/dL (ref 13.0–17.0)
MCH: 28.8 pg (ref 26.0–34.0)
MCHC: 32.8 g/dL (ref 30.0–36.0)
MCV: 87.6 fL (ref 80.0–100.0)
Platelets: 182 10*3/uL (ref 150–400)
RBC: 5.25 MIL/uL (ref 4.22–5.81)
RDW: 14.1 % (ref 11.5–15.5)
WBC: 10.3 10*3/uL (ref 4.0–10.5)
nRBC: 0 % (ref 0.0–0.2)

## 2019-12-25 LAB — URINE CULTURE: Culture: NO GROWTH

## 2019-12-25 LAB — PHOSPHORUS
Phosphorus: 2.7 mg/dL (ref 2.5–4.6)
Phosphorus: 2.9 mg/dL (ref 2.5–4.6)

## 2019-12-25 LAB — VALPROIC ACID LEVEL: Valproic Acid Lvl: 134 ug/mL — ABNORMAL HIGH (ref 50.0–100.0)

## 2019-12-25 LAB — GLUCOSE, CAPILLARY
Glucose-Capillary: 111 mg/dL — ABNORMAL HIGH (ref 70–99)
Glucose-Capillary: 95 mg/dL (ref 70–99)
Glucose-Capillary: 102 mg/dL — ABNORMAL HIGH (ref 70–99)
Glucose-Capillary: 146 mg/dL — ABNORMAL HIGH (ref 70–99)
Glucose-Capillary: 106 mg/dL — ABNORMAL HIGH (ref 70–99)
Glucose-Capillary: 123 mg/dL — ABNORMAL HIGH (ref 70–99)

## 2019-12-25 LAB — MAGNESIUM
Magnesium: 2.4 mg/dL (ref 1.7–2.4)
Magnesium: 2.4 mg/dL (ref 1.7–2.4)

## 2019-12-25 MED ORDER — WHITE PETROLATUM EX OINT
TOPICAL_OINTMENT | CUTANEOUS | Status: AC
  Administered 2019-12-25: 2
  Filled 2019-12-25: qty 28.35

## 2019-12-25 MED ORDER — DEXMEDETOMIDINE HCL IN NACL 400 MCG/100ML IV SOLN
0.4000 ug/kg/h | INTRAVENOUS | Status: DC
  Administered 2019-12-25: 0.4 ug/kg/h via INTRAVENOUS
  Administered 2019-12-25: 0.8 ug/kg/h via INTRAVENOUS
  Administered 2019-12-26: 1.2 ug/kg/h via INTRAVENOUS
  Administered 2019-12-26 (×2): 0.6 ug/kg/h via INTRAVENOUS
  Administered 2019-12-26: 20:00:00 1.2 ug/kg/h via INTRAVENOUS
  Administered 2019-12-27: 0.9 ug/kg/h via INTRAVENOUS
  Administered 2019-12-28: 1.2 ug/kg/h via INTRAVENOUS
  Administered 2019-12-28: 1.7 ug/kg/h via INTRAVENOUS
  Administered 2019-12-28: 1.5 ug/kg/h via INTRAVENOUS
  Administered 2019-12-28: 05:00:00 0.9 ug/kg/h via INTRAVENOUS
  Administered 2019-12-28: 1.7 ug/kg/h via INTRAVENOUS
  Administered 2019-12-29: 0.8 ug/kg/h via INTRAVENOUS
  Administered 2019-12-29: 02:00:00 1.7 ug/kg/h via INTRAVENOUS
  Administered 2019-12-29: 0.8 ug/kg/h via INTRAVENOUS
  Administered 2019-12-29 (×2): 1.7 ug/kg/h via INTRAVENOUS
  Administered 2019-12-30: 0.4 ug/kg/h via INTRAVENOUS
  Administered 2019-12-30: 0.6 ug/kg/h via INTRAVENOUS
  Administered 2019-12-30: 0.4 ug/kg/h via INTRAVENOUS
  Filled 2019-12-25 (×4): qty 100
  Filled 2019-12-25: qty 200
  Filled 2019-12-25 (×3): qty 100
  Filled 2019-12-25: qty 200
  Filled 2019-12-25 (×11): qty 100
  Filled 2019-12-25 (×2): qty 200

## 2019-12-25 MED ORDER — ACETAMINOPHEN 325 MG PO TABS
650.0000 mg | ORAL_TABLET | Freq: Four times a day (QID) | ORAL | Status: DC | PRN
  Administered 2019-12-28: 650 mg
  Filled 2019-12-25: qty 2

## 2019-12-25 MED ORDER — VITAL HIGH PROTEIN PO LIQD
1000.0000 mL | ORAL | Status: DC
  Administered 2019-12-25 – 2019-12-26 (×2): 1000 mL

## 2019-12-25 MED ORDER — LORAZEPAM 2 MG/ML IJ SOLN
INTRAMUSCULAR | Status: AC
  Filled 2019-12-25: qty 1

## 2019-12-25 MED ORDER — ACETAMINOPHEN 160 MG/5ML PO SOLN
650.0000 mg | Freq: Four times a day (QID) | ORAL | Status: DC | PRN
  Administered 2019-12-25 – 2019-12-26 (×5): 650 mg
  Filled 2019-12-25 (×4): qty 20.3

## 2019-12-25 MED ORDER — PRO-STAT SUGAR FREE PO LIQD
30.0000 mL | Freq: Two times a day (BID) | ORAL | Status: DC
  Administered 2019-12-25 – 2019-12-27 (×5): 30 mL
  Filled 2019-12-25 (×5): qty 30

## 2019-12-25 NOTE — Procedures (Addendum)
Patient Name: Dylan Moon  MRN: [id number]  Epilepsy Attending: Charlsie Quest  Referring Physician/Provider: Dr. Caryl Pina Duration: 12/24/2019 1310 to 12/25/2019 1310  Patient history: 24 year old male with history of epilepsy presented in status epilepticus. EEG to evaluate for subclinical seizures.  Level of alertness: Comatose/sedated  AEDs during EEG study: Depakote, Versed  Technical aspects: This EEG study was done with scalp electrodes positioned according to the 10-20 International system of electrode placement. Electrical activity was acquired at a sampling rate of 500Hz  and reviewed with a high frequency filter of 70Hz  and a low frequency filter of 1Hz . EEG data were recorded continuously and digitally stored.   Description: EEG showed continuous generalized 2 to 3 Hz delta activity with overriding 15 to 18 Hz, 2-3 uV beta activity distributed symmetrically and diffusely. Sharp waves were noted in left frontotemporal region as well as sharp transients in right frontotemporal region. EEG was reactive to tactile stimulation. Hyperventilation and photic stimulation were not performed.  Event button was pressed on 12/25/2019 at 0849 and 1235. Patient was noted to have axial stiffening. Concomitant EEG before, during and after the event didn't show any eeg change to suggest seizure.  Abnormality - Sharp waves, left frontotemporal region - Continuous slow, generalized - Excessive beta, generalized  IMPRESSION: This study is suggestive of epileptogenicity in left frontotemporal region. Additionally, there is evidence of severe diffuse encephalopathy, nonspecific to etiology but most likely secondary to sedation. No seizures were seen throughout the recording.  Event button was pressed on 12/25/2019 as described above without eeg change and therefore not epileptic.   Elwin Tsou 

## 2019-12-25 NOTE — Progress Notes (Signed)
eLink Physician-Brief Progress Note Patient Name: Dylan Moon DOB: 1995/11/07 MRN: [id number]   Date of Service  12/25/2019  HPI/Events of Note  RN asked if foley can be replaced since it is leaking, especially when he moves around . Intubated patient and accurate measurement needed  eICU Interventions  Replace foley      Intervention Category Minor Interventions: Routine modifications to care plan (e.g. PRN medications for pain, fever)  Kaelyn Innocent G Harman Ferrin 12/25/2019, 5:58 AM

## 2019-12-25 NOTE — Progress Notes (Signed)
Spoke with Wenda Low, RN c/o PICC order. Cultures pending and patient has sufficient PIVs for ordered medication. Primary MD to be contacted concerning blood cultures and PICC placement.

## 2019-12-25 NOTE — Progress Notes (Signed)
Brief Nutrition Note  Consult received for enteral/tube feeding initiation and management.  Adult Enteral Nutrition Protocol initiated. Full assessment to follow.  Admitting Dx: Status epilepticus (HCC) [G40.901] Acute respiratory failure with hypoxia (HCC) [J96.01]   Patient is currently intubated on ventilator support MV: 10.1 L/min Temp (24hrs), Avg:100.4 F (38 C), Min:97.9 F (36.6 C), Max:103 F (39.4 C)  Propofol: 19.58 ml/hr Medications reviewed and include: Eslicarbazepine acetate, SSI, Depakene Drips: Pepcid Lactated ringers with KCl 20 mEq Versed 10 mg  Body mass index is 24.46 kg/m. Pt meets criteria for normal based on current BMI.  Labs:  Recent Labs  Lab 12/24/19 1046 12/24/19 1207 12/24/19 1713 12/25/19 0400 12/25/19 0549  NA 138   < > 140 139 139  K 4.6   < > 4.5 4.5 4.6  CL 98  --  104  --  104  CO2 10*  --  25  --  19*  BUN 16  --  18  --  20  CREATININE 1.59*  --  2.05*  --  2.37*  CALCIUM 9.9  --  9.1  --  9.0  MG 2.8*  --   --   --   --   PHOS 6.8*  --   --   --   --   GLUCOSE 216*  --  92  --  140*   < > = values in this interval not displayed.    Lars Masson, RD, LDN Clinical Nutrition After Hours/Weekend Pager # in Amion

## 2019-12-25 NOTE — Progress Notes (Signed)
LTM maint complete - no skin breakdown under:  Fp2, F4, F8, M2

## 2019-12-25 NOTE — Progress Notes (Signed)
0413 patient began to have rhythmic jerking of the arms and torso with subsequent inc HR,  (120s), inc RR (40s) and dec oxygen (70s).   Lindzen paged and made aware.  Override 2mg  ativan given  10mg  Versed bolus given  Versed rate increased from 10-20mg /hr Prop increased 30 to 70 mcg/hr.  Seizure like activity lasted 11 minutes.

## 2019-12-25 NOTE — Progress Notes (Signed)
NAME:  Dylan Moon, MRN:  [id number], DOB:  06/22/1996, LOS: 1 ADMISSION DATE:  12/24/2019,  REFERRING MD:  EDP, CHIEF COMPLAINT:  Status epilepticus.  Brief History   Dylan Moon is a 24 y.o male with ADHD and epilepsy who presented to the emergency department with status epilepticus. He was subsequently intubated and admitted to the ICU for burst suppression. She states that he is compliant with all of his AEDs. He is currently on Depakote ER 9629 mg qhs and eslicarbazepine 528 mg tabs 1.5 tabs per day. The mother states that he typically has a seizure at least monthly. Possible etoh ingestion prior to admission.  Consults:  Neurology  Procedures:  Endotracheal intubation 4/2  Significant Diagnostic Tests:  CT head 4/2:  1. No evidence of acute intracranial abnormality. 2. Left sphenoid sinus mucous retention cyst.  CT cervical spine 4/2:  1. No evidence of acute fracture to the cervical spine. 2. Nonspecific reversal of the expected cervical lordosis, which may be positional.  EEG 4/3 Abnormality - Sharp waves, left frontotemporal region - Continuous slow, generalized - Excessive beta, generalized  IMPRESSION: This study issuggestive of epileptogenicity in left frontotemporal region. Additionally, there is evidence of severe diffuse encephalopathy, nonspecific to etiology but most likely secondary to sedation.No seizures were seen throughout the recording.  Micro Data:  MRSA 4/2 negative Blood cultures 4/2 pending Resp panel 4/2 NGTD  Antimicrobials:  none  Interim history/subjective:  This morning EEG was abnormal and he had a witnessed tonic-clonic seizure. Continues to be intubated and sedated. Had urinary retention overnight and once foley was changed out there was 1200cc released.   Objective   Blood pressure 122/77, pulse 92, temperature 100 F (37.8 C), temperature source Oral, resp. rate 18, height 6' (1.829 m), weight 81.8 kg, SpO2 100 %.      Vent Mode: PRVC FiO2 (%):  [40 %-100 %] 40 % Set Rate:  [14 bmp-18 bmp] 18 bmp Vt Set:  [620 mL] 620 mL PEEP:  [5 cmH20] 5 cmH20 Plateau Pressure:  [18 cmH20-21 cmH20] 20 cmH20   Intake/Output Summary (Last 24 hours) at 12/25/2019 4132 Last data filed at 12/25/2019 0600 Gross per 24 hour  Intake 1449.9 ml  Output 2700 ml  Net -1250.1 ml   Filed Weights   12/24/19 1056 12/25/19 0500  Weight: 81.6 kg 81.8 kg    Examination: General: intubated, sedated, EEG device in place HENT: ETT to vent Lungs: CTAB Cardiovascular: RRR Abdomen: scaphoid, soft, normal bs Extremities: thin, no edema Neuro: sedated deeply, not responsive MSK: no rashes Lines: PIV  Resolved Hospital Problem list     Assessment & Plan:  Phyllis E Cardoza is a 24 y.o. man with history of seizure disorder who presents with:  Status epilepticus - AED per neurology for burst suppression - on versed, propofol, VPA, eslicarbazepine (home med)  AKI - suspect secondary to urinary retention - adequate urine output after foley was placed - will send a UA  - if not stabilizing by tomorrow will pursue AKI work up including renal ultrasound, nephrology consultation - ok to continue versed despite renal function given risks of uncontrolled seizures. Will discuss dose adjustment with pharmacy  Acute hypoxemic respiratory failure - secondary to clinical coma from breakthrough seizures - continue lung protective ventilation - not appropriate for SBT at this time given burst suppression  Best practice:  Diet: tube feeds, will start today and consult dietary Pain/Anxiety/Delirium protocol (if indicated): see above VAP protocol (if indicated): ordered DVT  prophylaxis: lovenox GI prophylaxis: pepcid Glucose control: controlled<180 Foley yes for retention Mobility: bedrest Code Status: full Family Communication: Mother at bedside Disposition: needs ICU   Labs   CBC: Recent Labs  Lab 12/24/19 1046 12/24/19 1207  12/25/19 0400  WBC 13.5*  --   --   NEUTROABS 4.9  --   --   HGB 17.2* 16.3 17.0  HCT 57.7* 48.0 50.0  MCV 96.8  --   --   PLT 271  --   --     Basic Metabolic Panel: Recent Labs  Lab 12/24/19 1046 12/24/19 1207 12/24/19 1713 12/25/19 0400 12/25/19 0549  NA 138 137 140 139 139  K 4.6 5.3* 4.5 4.5 4.6  CL 98  --  104  --  104  CO2 10*  --  25  --  19*  GLUCOSE 216*  --  92  --  140*  BUN 16  --  18  --  20  CREATININE 1.59*  --  2.05*  --  2.37*  CALCIUM 9.9  --  9.1  --  9.0  MG 2.8*  --   --   --   --   PHOS 6.8*  --   --   --   --    GFR: Estimated Creatinine Clearance: 52.8 mL/min (A) (by C-G formula based on SCr of 2.37 mg/dL (H)). Recent Labs  Lab 12/24/19 1046 12/24/19 1322 12/24/19 1713 12/24/19 1817  WBC 13.5*  --   --   --   LATICACIDVEN >11.0* 6.0* 2.0* 2.8*    Liver Function Tests: Recent Labs  Lab 12/24/19 1046  AST 34  ALT 19  ALKPHOS 51  BILITOT 0.1*  PROT 7.8  ALBUMIN 5.1*   Recent Labs  Lab 12/24/19 1046  LIPASE 26   No results for input(s): AMMONIA in the last 168 hours.  ABG    Component Value Date/Time   PHART 7.436 12/25/2019 0400   PCO2ART 32.4 12/25/2019 0400   PO2ART 107.0 12/25/2019 0400   HCO3 21.6 12/25/2019 0400   TCO2 23 12/25/2019 0400   ACIDBASEDEF 1.0 12/25/2019 0400   O2SAT 98.0 12/25/2019 0400     Coagulation Profile: Recent Labs  Lab 12/24/19 1046  INR 1.1    Cardiac Enzymes: No results for input(s): CKTOTAL, CKMB, CKMBINDEX, TROPONINI in the last 168 hours.  HbA1C: Hgb A1c MFr Bld  Date/Time Value Ref Range Status  12/24/2019 05:13 PM 5.8 (H) 4.8 - 5.6 % Final    Comment:    (NOTE) Pre diabetes:          5.7%-6.4% Diabetes:              >6.4% Glycemic control for   <7.0% adults with diabetes     CBG: Recent Labs  Lab 12/24/19 1039 12/24/19 1929 12/24/19 2327 12/25/19 0323  GLUCAP 200* 100* 90 111*    Critical care time:   The patient is critically ill with multiple organ systems  failure and requires high complexity decision making for assessment and support, frequent evaluation and titration of therapies, application of advanced monitoring technologies and extensive interpretation of multiple databases.   Critical Care Time devoted to patient care services described in this note is 33 minutes. This time reflects the time of my personal involvement. This critical care time does not reflect separately billable procedures or procedure time, teaching time or supervisory time of PA/NP/Med student/Med Resident etc but could involve care discussion time.  Mickel Baas [facility] 12/25/2019 8:38 AM  Pager: [phone] After hours pager: [phone]

## 2019-12-25 NOTE — Progress Notes (Addendum)
Subjective: Patient in bed, intubated, sedated, breathing over the vent. No family at bedside. Initially resting comfortably, but after noxious stimuli began twitching/jumping on left side only. Per RN patient did this all last night as well. Patient had 2 seizure-like spells this AM, one of which lasted 11 minutes - RN states that this spell may also have been due to shivering. Versed drip was increased to a rate of 20 after a bolus of 10 mg. Also on propofol. LTM running  Objective: Current vital signs: BP 119/76   Pulse 92   Temp 100 F (37.8 C) (Oral)   Resp 18   Ht 6' (1.829 m)   Wt 81.8 kg   SpO2 100%   BMI 24.46 kg/m  Vital signs in last 24 hours: Temp:  [97.9 F (36.6 C)-103 F (39.4 C)] 100 F (37.8 C) (04/03 0400) Pulse Rate:  [83-145] 92 (04/03 0749) Resp:  [0-36] 18 (04/03 0749) BP: (94-188)/(57-141) 119/76 (04/03 0800) SpO2:  [93 %-100 %] 100 % (04/03 0749) FiO2 (%):  [40 %-100 %] 40 % (04/03 0749) Weight:  [81.6 kg-81.8 kg] 81.8 kg (04/03 0500)  Intake/Output from previous day: 04/02 0701 - 04/03 0700 In: 1449.9 [I.V.:1337.6; IV Piggyback:112.3] Out: 2700 [Urine:2700] Intake/Output this shift: Total I/O In: 60 [NG/GT:60] Out: -  Nutritional status:  Diet Order            Diet NPO time specified  Diet effective now             Physical Exam  Constitutional: Appears well-developed and well-nourished.  Eyes: Normal external eye and conjunctiva. HEENT: Normocephalic, no lesions, without obvious abnormality. No nuchal rigidity.   Musculoskeletal-no joint tenderness, deformity or swelling Cardiovascular: Normal rate and regular rhythm.  Respiratory: Breathing above the vent. GI: Soft.  No distension.  Skin: WDI.  Neurologic Exam: Ment: Intubated, sedated, not following commands. Breathing above the vent.  CN: Pupils are pinpoint and non-reactive. Face appears symmetric in presence of ET tube. No nystagmus or roving eye movements noted. No cough/gag with  suctioning. Motor/Sensory: No response to noxious or deep sternal rub DTR: 2+ patellar reflexes. Toes mute bilaterally. Other: When propofol is held, but while still on Versed at a rate of 20, the patient begins to exhibit hiccups at about one every 5-10 seconds, then begins to have intermittent spells of varying length lasting about 1-3 seconds each during which he weakly internally rotates his BUE and BLE with low amplitude high frequency shivering-like movements of his arms.   Lab Results: Results for orders placed or performed during the hospital encounter of 12/24/19 (from the past 48 hour(s))  CBG monitoring, ED     Status: Abnormal   Collection Time: 12/24/19 10:39 AM  Result Value Ref Range   Glucose-Capillary 200 (H) 70 - 99 mg/dL    Comment: Glucose reference range applies only to samples taken after fasting for at least 8 hours.  Comprehensive metabolic panel     Status: Abnormal   Collection Time: 12/24/19 10:46 AM  Result Value Ref Range   Sodium 138 135 - 145 mmol/L   Potassium 4.6 3.5 - 5.1 mmol/L   Chloride 98 98 - 111 mmol/L   CO2 10 (L) 22 - 32 mmol/L   Glucose, Bld 216 (H) 70 - 99 mg/dL    Comment: Glucose reference range applies only to samples taken after fasting for at least 8 hours.   BUN 16 6 - 20 mg/dL   Creatinine, Ser 1.59 (H) 0.61 -  1.24 mg/dL   Calcium 9.9 8.9 - 10.3 mg/dL   Total Protein 7.8 6.5 - 8.1 g/dL   Albumin 5.1 (H) 3.5 - 5.0 g/dL   AST 34 15 - 41 U/L   ALT 19 0 - 44 U/L   Alkaline Phosphatase 51 38 - 126 U/L   Total Bilirubin 0.1 (L) 0.3 - 1.2 mg/dL   GFR calc non Af Amer 60 (L) >60 mL/min   GFR calc Af Amer >60 >60 mL/min   Anion gap 30 (H) 5 - 15    Comment: Performed at [facility] [address] [place], [place] [address]  Ethanol     Status: None   Collection Time: 12/24/19 10:46 AM  Result Value Ref Range   Alcohol, Ethyl (B) <10 <10 mg/dL    Comment: (NOTE) Lowest detectable limit for serum alcohol is 10 mg/dL. For medical  purposes only. Performed at [facility], [place] [address], [place], [place] [address]   Lipase, blood     Status: None   Collection Time: 12/24/19 10:46 AM  Result Value Ref Range   Lipase 26 11 - 51 U/L    Comment: Performed at [facility] [address], [place], [place] [address]  Brain natriuretic peptide     Status: None   Collection Time: 12/24/19 10:46 AM  Result Value Ref Range   B Natriuretic Peptide 76.0 0.0 - 100.0 pg/mL    Comment: Performed at [facility] [address] [place], [place] [address]  Troponin I (High Sensitivity)     Status: None   Collection Time: 12/24/19 10:46 AM  Result Value Ref Range   Troponin I (High Sensitivity) 7 <18 ng/L    Comment: (NOTE) Elevated high sensitivity troponin I (hsTnI) values and significant  changes across serial measurements may suggest ACS but many other  chronic and acute conditions are known to elevate hsTnI results.  Refer to the Links section for chest pain algorithms and additional  guidance. Performed at [facility], [place] [address] [place], [place] [address]   Lactic acid, plasma     Status: Abnormal   Collection Time: 12/24/19 10:46 AM  Result Value Ref Range   Lactic Acid, Venous >11.0 (HH) 0.5 - 1.9 mmol/L    Comment: CRITICAL RESULT CALLED TO, READ BACK BY AND VERIFIED WITH: Rhona Leavens RN [phone] BY A BENNETT Performed at [facility], [place] [address], [place], [place] [address]   CBC with Differential     Status: Abnormal   Collection Time: 12/24/19 10:46 AM  Result Value Ref Range   WBC 13.5 (H) 4.0 - 10.5 K/uL   RBC 5.96 (H) 4.22 - 5.81 MIL/uL   Hemoglobin 17.2 (H) 13.0 - 17.0 g/dL   HCT 57.7 (H) 39.0 - 52.0 %   MCV 96.8 80.0 - 100.0 fL   MCH 28.9 26.0 - 34.0 pg   MCHC 29.8 (L) 30.0 - 36.0 g/dL   RDW 13.5 11.5 - 15.5 %   Platelets 271 150 - 400 K/uL   nRBC 0.0 0.0 - 0.2 %   Neutrophils Relative % 37 %   Neutro Abs 4.9 1.7 - 7.7 K/uL   Lymphocytes Relative 51 %    Lymphs Abs 6.9 (H) 0.7 - 4.0 K/uL   Monocytes Relative 8 %   Monocytes Absolute 1.1 (H) 0.1 - 1.0 K/uL   Eosinophils Relative 1 %   Eosinophils Absolute 0.1 0.0 - 0.5  K/uL   Basophils Relative 1 %   Basophils Absolute 0.1 0.0 - 0.1 K/uL   Immature Granulocytes 2 %   Abs Immature Granulocytes 0.33 (H) 0.00 - 0.07 K/uL    Comment: Performed at [facility], [place] [address], [place], [place] [address]  Protime-INR     Status: None   Collection Time: 12/24/19 10:46 AM  Result Value Ref Range   Prothrombin Time 13.8 11.4 - 15.2 seconds   INR 1.1 0.8 - 1.2    Comment: (NOTE) INR goal varies based on device and disease states. Performed at [facility], [place] [address], [place], [place] [address]   Magnesium     Status: Abnormal   Collection Time: 12/24/19 10:46 AM  Result Value Ref Range   Magnesium 2.8 (H) 1.7 - 2.4 mg/dL    Comment: Performed at [facility] [address] [place], [place] [address]  Phosphorus     Status: Abnormal   Collection Time: 12/24/19 10:46 AM  Result Value Ref Range   Phosphorus 6.8 (H) 2.5 - 4.6 mg/dL    Comment: Performed at [facility] [address] [place], [place] [address]  TSH     Status: None   Collection Time: 12/24/19 10:46 AM  Result Value Ref Range   TSH 2.542 0.350 - 4.500 uIU/mL    Comment: Performed by a 3rd Generation assay with a functional sensitivity of <=0.01 uIU/mL. Performed at [facility], [place] [address], [place], [place] [address]   Valproic acid level     Status: Abnormal   Collection Time: 12/24/19 10:46 AM  Result Value Ref Range   Valproic Acid Lvl 39 (L) 50.0 - 100.0 ug/mL    Comment: Performed at [facility] [address], [place], [place] [address]  Triglycerides     Status: None   Collection Time: 12/24/19 10:46 AM  Result Value Ref Range   Triglycerides 119 <150 mg/dL    Comment: Performed at [facility] [address], [place], [place] [address]  Urinalysis,  Routine w reflex microscopic     Status: Abnormal   Collection Time: 12/24/19 11:03 AM  Result Value Ref Range   Color, Urine STRAW (A) YELLOW   APPearance HAZY (A) CLEAR   Specific Gravity, Urine 1.015 1.005 - 1.030   pH 5.0 5.0 - 8.0   Glucose, UA 50 (A) NEGATIVE mg/dL   Hgb urine dipstick MODERATE (A) NEGATIVE   Bilirubin Urine NEGATIVE NEGATIVE   Ketones, ur NEGATIVE NEGATIVE mg/dL   Protein, ur >=300 (A) NEGATIVE mg/dL   Nitrite NEGATIVE NEGATIVE   Leukocytes,Ua NEGATIVE NEGATIVE   RBC / HPF 0-5 0 - 5 RBC/hpf   WBC, UA 0-5 0 - 5 WBC/hpf   Bacteria, UA RARE (A) NONE SEEN   Squamous Epithelial / LPF 0-5 0 - 5   Mucus PRESENT    Granular Casts, UA PRESENT    Amorphous Crystal PRESENT     Comment: Performed at [facility], [place] [address] [place], [place] [address]  Urine rapid drug screen (hosp performed)     Status: Abnormal   Collection Time: 12/24/19 11:03 AM  Result Value Ref Range   Opiates NONE DETECTED NONE DETECTED   Cocaine NONE DETECTED NONE DETECTED   Benzodiazepines POSITIVE (A) NONE DETECTED   Amphetamines NONE DETECTED NONE DETECTED   Tetrahydrocannabinol POSITIVE (A) NONE DETECTED   Barbiturates NONE DETECTED NONE DETECTED  Comment: (NOTE) DRUG SCREEN FOR MEDICAL PURPOSES ONLY.  IF CONFIRMATION IS NEEDED FOR ANY PURPOSE, NOTIFY LAB WITHIN 5 DAYS. LOWEST DETECTABLE LIMITS FOR URINE DRUG SCREEN Drug Class                     Cutoff (ng/mL) Amphetamine and metabolites    1000 Barbiturate and metabolites    200 Benzodiazepine                 932 Tricyclics and metabolites     300 Opiates and metabolites        300 Cocaine and metabolites        300 THC                            50 Performed at [facility], [place] [address], [place], [place] [address]   I-STAT 7, (LYTES, BLD GAS, ICA, H+H)     Status: Abnormal   Collection Time: 12/24/19 12:07 PM  Result Value Ref Range   pH, Arterial 7.309 (L) 7.350 - 7.450   pCO2 arterial 43.1 32.0 -  48.0 mmHg   pO2, Arterial 155.0 (H) 83.0 - 108.0 mmHg   Bicarbonate 21.6 20.0 - 28.0 mmol/L   TCO2 23 22 - 32 mmol/L   O2 Saturation 99.0 %   Acid-base deficit 5.0 (H) 0.0 - 2.0 mmol/L   Sodium 137 135 - 145 mmol/L   Potassium 5.3 (H) 3.5 - 5.1 mmol/L   Calcium, Ion 1.23 1.15 - 1.40 mmol/L   HCT 48.0 39.0 - 52.0 %   Hemoglobin 16.3 13.0 - 17.0 g/dL   Patient temperature HIDE    Sample type ARTERIAL   Lactic acid, plasma     Status: Abnormal   Collection Time: 12/24/19  1:22 PM  Result Value Ref Range   Lactic Acid, Venous 6.0 (HH) 0.5 - 1.9 mmol/L    Comment: CRITICAL VALUE NOTED.  VALUE IS CONSISTENT WITH PREVIOUSLY REPORTED AND CALLED VALUE. Performed at [facility], [place] [address], [place], [place] [address]   Troponin I (High Sensitivity)     Status: Abnormal   Collection Time: 12/24/19  1:22 PM  Result Value Ref Range   Troponin I (High Sensitivity) 24 (H) <18 ng/L    Comment: (NOTE) Elevated high sensitivity troponin I (hsTnI) values and significant  changes across serial measurements may suggest ACS but many other  chronic and acute conditions are known to elevate hsTnI results.  Refer to the "Links" section for chest pain algorithms and additional  guidance. Performed at [facility], [place] [address] [place], [place] [address]   Respiratory Panel by RT PCR (Flu A&B, Covid) - Nasopharyngeal Swab     Status: None   Collection Time: 12/24/19  3:23 PM   Specimen: Nasopharyngeal Swab  Result Value Ref Range   SARS Coronavirus 2 by RT PCR NEGATIVE NEGATIVE    Comment: (NOTE) SARS-CoV-2 target nucleic acids are NOT DETECTED. The SARS-CoV-2 RNA is generally detectable in upper respiratoy specimens during the acute phase of infection. The lowest concentration of SARS-CoV-2 viral copies this assay can detect is 131 copies/mL. A negative result does not preclude SARS-Cov-2 infection and should not be used as the sole basis for treatment or other patient  management decisions. A negative result may occur with  improper specimen collection/handling, submission of specimen other than nasopharyngeal swab, presence of viral mutation(s) within the areas targeted by  this assay, and inadequate number of viral copies (<131 copies/mL). A negative result must be combined with clinical observations, patient history, and epidemiological information. The expected result is Negative. Fact Sheet for Patients:  PinkCheek.be Fact Sheet for Healthcare Providers:  GravelBags.it This test is not yet ap proved or cleared by the [place] FDA and  has been authorized for detection and/or diagnosis of SARS-CoV-2 by FDA under an Emergency Use Authorization (EUA). This EUA will remain  in effect (meaning this test can be used) for the duration of the COVID-19 declaration under Section 564(b)(1) of the Act, 21 U.S.C. section 360bbb-3(b)(1), unless the authorization is terminated or revoked sooner.    Influenza A by PCR NEGATIVE NEGATIVE   Influenza B by PCR NEGATIVE NEGATIVE    Comment: (NOTE) The Xpert Xpress SARS-CoV-2/FLU/RSV assay is intended as an aid in  the diagnosis of influenza from Nasopharyngeal swab specimens and  should not be used as a sole basis for treatment. Nasal washings and  aspirates are unacceptable for Xpert Xpress SARS-CoV-2/FLU/RSV  testing. Fact Sheet for Patients: PinkCheek.be Fact Sheet for Healthcare Providers: GravelBags.it This test is not yet approved or cleared by the [place] FDA and  has been authorized for detection and/or diagnosis of SARS-CoV-2 by  FDA under an Emergency Use Authorization (EUA). This EUA will remain  in effect (meaning this test can be used) for the duration of the  Covid-19 declaration under Section 564(b)(1) of the Act, 21  U.S.C. section 360bbb-3(b)(1), unless the authorization  is  terminated or revoked. Performed at [facility], [place] [address], [place], [place] [address]   MRSA PCR Screening     Status: None   Collection Time: 12/24/19  4:30 PM   Specimen: Nasopharyngeal  Result Value Ref Range   MRSA by PCR NEGATIVE NEGATIVE    Comment:        The GeneXpert MRSA Assay (FDA approved for NASAL specimens only), is one component of a comprehensive MRSA colonization surveillance program. It is not intended to diagnose MRSA infection nor to guide or monitor treatment for MRSA infections. Performed at [facility], [place] [address], [place], [place] [address]   Basic metabolic panel     Status: Abnormal   Collection Time: 12/24/19  5:13 PM  Result Value Ref Range   Sodium 140 135 - 145 mmol/L   Potassium 4.5 3.5 - 5.1 mmol/L    Comment: SLIGHT HEMOLYSIS   Chloride 104 98 - 111 mmol/L   CO2 25 22 - 32 mmol/L   Glucose, Bld 92 70 - 99 mg/dL    Comment: Glucose reference range applies only to samples taken after fasting for at least 8 hours.   BUN 18 6 - 20 mg/dL   Creatinine, Ser 2.05 (H) 0.61 - 1.24 mg/dL   Calcium 9.1 8.9 - 10.3 mg/dL   GFR calc non Af Amer 44 (L) >60 mL/min   GFR calc Af Amer 51 (L) >60 mL/min   Anion gap 11 5 - 15    Comment: Performed at [facility] [address] [place], [place] [address]  Hemoglobin A1c     Status: Abnormal   Collection Time: 12/24/19  5:13 PM  Result Value Ref Range   Hgb A1c MFr Bld 5.8 (H) 4.8 - 5.6 %    Comment: (NOTE) Pre diabetes:          5.7%-6.4% Diabetes:              >  6.4% Glycemic control for   <7.0% adults with diabetes    Mean Plasma Glucose 119.76 mg/dL    Comment: Performed at [facility] [address] [place], [place], [place] [address]  Lactic acid, plasma     Status: Abnormal   Collection Time: 12/24/19  5:13 PM  Result Value Ref Range   Lactic Acid, Venous 2.0 (HH) 0.5 - 1.9 mmol/L    Comment: CRITICAL VALUE NOTED.  VALUE IS CONSISTENT WITH PREVIOUSLY REPORTED  AND CALLED VALUE. Performed at [facility], [place] [address], [place], [place] [address]   Lactic acid, plasma     Status: Abnormal   Collection Time: 12/24/19  6:17 PM  Result Value Ref Range   Lactic Acid, Venous 2.8 (HH) 0.5 - 1.9 mmol/L    Comment: CRITICAL VALUE NOTED.  VALUE IS CONSISTENT WITH PREVIOUSLY REPORTED AND CALLED VALUE. Performed at [facility], [place] [address] [place], [place] [address]   Glucose, capillary     Status: Abnormal   Collection Time: 12/24/19  7:29 PM  Result Value Ref Range   Glucose-Capillary 100 (H) 70 - 99 mg/dL    Comment: Glucose reference range applies only to samples taken after fasting for at least 8 hours.  Glucose, capillary     Status: None   Collection Time: 12/24/19 11:27 PM  Result Value Ref Range   Glucose-Capillary 90 70 - 99 mg/dL    Comment: Glucose reference range applies only to samples taken after fasting for at least 8 hours.  Glucose, capillary     Status: Abnormal   Collection Time: 12/25/19  3:23 AM  Result Value Ref Range   Glucose-Capillary 111 (H) 70 - 99 mg/dL    Comment: Glucose reference range applies only to samples taken after fasting for at least 8 hours.  I-STAT 7, (LYTES, BLD GAS, ICA, H+H)     Status: None   Collection Time: 12/25/19  4:00 AM  Result Value Ref Range   pH, Arterial 7.436 7.350 - 7.450   pCO2 arterial 32.4 32.0 - 48.0 mmHg   pO2, Arterial 107.0 83.0 - 108.0 mmHg   Bicarbonate 21.6 20.0 - 28.0 mmol/L   TCO2 23 22 - 32 mmol/L   O2 Saturation 98.0 %   Acid-base deficit 1.0 0.0 - 2.0 mmol/L   Sodium 139 135 - 145 mmol/L   Potassium 4.5 3.5 - 5.1 mmol/L   Calcium, Ion 1.18 1.15 - 1.40 mmol/L   HCT 50.0 39.0 - 52.0 %   Hemoglobin 17.0 13.0 - 17.0 g/dL   Patient temperature 100.5 F    Collection site RADIAL, ALLEN'S TEST ACCEPTABLE    Drawn by Operator    Sample type ARTERIAL   Basic metabolic panel     Status: Abnormal   Collection Time: 12/25/19  5:49 AM  Result Value Ref Range    Sodium 139 135 - 145 mmol/L   Potassium 4.6 3.5 - 5.1 mmol/L   Chloride 104 98 - 111 mmol/L   CO2 19 (L) 22 - 32 mmol/L   Glucose, Bld 140 (H) 70 - 99 mg/dL    Comment: Glucose reference range applies only to samples taken after fasting for at least 8 hours.   BUN 20 6 - 20 mg/dL   Creatinine, Ser 2.37 (H) 0.61 - 1.24 mg/dL   Calcium 9.0 8.9 - 10.3 mg/dL   GFR calc non Af Amer 37 (L) >60 mL/min   GFR calc Af Amer 43 (L) >60 mL/min  Anion gap 16 (H) 5 - 15    Comment: Performed at [facility], [place] [address], [place], [place] [address]    Recent Results (from the past 240 hour(s))  Respiratory Panel by RT PCR (Flu A&B, Covid) - Nasopharyngeal Swab     Status: None   Collection Time: 12/24/19  3:23 PM   Specimen: Nasopharyngeal Swab  Result Value Ref Range Status   SARS Coronavirus 2 by RT PCR NEGATIVE NEGATIVE Final    Comment: (NOTE) SARS-CoV-2 target nucleic acids are NOT DETECTED. The SARS-CoV-2 RNA is generally detectable in upper respiratoy specimens during the acute phase of infection. The lowest concentration of SARS-CoV-2 viral copies this assay can detect is 131 copies/mL. A negative result does not preclude SARS-Cov-2 infection and should not be used as the sole basis for treatment or other patient management decisions. A negative result may occur with  improper specimen collection/handling, submission of specimen other than nasopharyngeal swab, presence of viral mutation(s) within the areas targeted by this assay, and inadequate number of viral copies (<131 copies/mL). A negative result must be combined with clinical observations, patient history, and epidemiological information. The expected result is Negative. Fact Sheet for Patients:  PinkCheek.be Fact Sheet for Healthcare Providers:  GravelBags.it This test is not yet ap proved or cleared by the [place] FDA and  has been authorized for  detection and/or diagnosis of SARS-CoV-2 by FDA under an Emergency Use Authorization (EUA). This EUA will remain  in effect (meaning this test can be used) for the duration of the COVID-19 declaration under Section 564(b)(1) of the Act, 21 U.S.C. section 360bbb-3(b)(1), unless the authorization is terminated or revoked sooner.    Influenza A by PCR NEGATIVE NEGATIVE Final   Influenza B by PCR NEGATIVE NEGATIVE Final    Comment: (NOTE) The Xpert Xpress SARS-CoV-2/FLU/RSV assay is intended as an aid in  the diagnosis of influenza from Nasopharyngeal swab specimens and  should not be used as a sole basis for treatment. Nasal washings and  aspirates are unacceptable for Xpert Xpress SARS-CoV-2/FLU/RSV  testing. Fact Sheet for Patients: PinkCheek.be Fact Sheet for Healthcare Providers: GravelBags.it This test is not yet approved or cleared by the [place] FDA and  has been authorized for detection and/or diagnosis of SARS-CoV-2 by  FDA under an Emergency Use Authorization (EUA). This EUA will remain  in effect (meaning this test can be used) for the duration of the  Covid-19 declaration under Section 564(b)(1) of the Act, 21  U.S.C. section 360bbb-3(b)(1), unless the authorization is  terminated or revoked. Performed at [facility], [place] [address] [place], [place] [address]   MRSA PCR Screening     Status: None   Collection Time: 12/24/19  4:30 PM   Specimen: Nasopharyngeal  Result Value Ref Range Status   MRSA by PCR NEGATIVE NEGATIVE Final    Comment:        The GeneXpert MRSA Assay (FDA approved for NASAL specimens only), is one component of a comprehensive MRSA colonization surveillance program. It is not intended to diagnose MRSA infection nor to guide or monitor treatment for MRSA infections. Performed at [facility], [place] [address] [place], [place] [address]     Lipid Panel Recent Labs     12/24/19 1046  TRIG 119    Studies/Results: EEG  Result Date: 12/24/2019 Lora Havens, MD     12/24/2019  1:11 PM Patient Name: Dylan Moon MRN: [id number] Epilepsy Attending: Marcelle Overlie  Barbra Sarks Referring Physician/Provider: Dr. Kerney Elbe Date: 12/24/2019 Duration: 25.68 mins Patient history: 24 year old male with history of epilepsy presented in status epilepticus.  EEG to evaluate for subclinical seizures. Level of alertness: Comatose/sedated AEDs during EEG study: Depakote, Versed, propofol Technical aspects: This EEG study was done with scalp electrodes positioned according to the 10-20 International system of electrode placement. Electrical activity was acquired at a sampling rate of '500Hz'  and reviewed with a high frequency filter of '70Hz'  and a low frequency filter of '1Hz' . EEG data were recorded continuously and digitally stored. Description: EEG showed continuous generalized 2 to 3 Hz delta activity with overriding 15 to 18 Hz, 2-3 uV beta activity distributed symmetrically and diffusely.  EEG was reactive to tactile stimulation. Hyperventilation and photic stimulation were not performed. Abnormality -Continuous slow, generalized -Excessive beta, generalized IMPRESSION: This study is suggestive of severe diffuse encephalopathy, nonspecific to etiology but most likely secondary to sedation. The excessive beta activity seen in the background is most likely due to the effect of benzodiazepine and is a benign EEG pattern. No seizures or epileptiform discharges were seen throughout the recording. Dr Cheral Marker was notified. Lora Havens   CT Head Wo Contrast  Result Date: 12/24/2019 CLINICAL DATA:  Head trauma, moderate/severe. Additional history provided: Patient brought in by ambulance from home with seizures, 2 witnessed tonic-clonic seizures with EMS, patient unresponsive, fall. EXAM: CT HEAD WITHOUT CONTRAST CT CERVICAL SPINE WITHOUT CONTRAST TECHNIQUE: Multidetector CT imaging of the head and  cervical spine was performed following the standard protocol without intravenous contrast. Multiplanar CT image reconstructions of the cervical spine were also generated. COMPARISON:  CT head 01/15/2019, CT head/maxillofacial/cervical spine 04/23/2018 FINDINGS: CT HEAD FINDINGS Brain: There is no evidence of acute intracranial hemorrhage, intracranial mass, midline shift or extra-axial fluid collection.No demarcated cortical infarction. Vascular: No hyperdense vessel. Skull: Normal. Negative for fracture or focal lesion. Sinuses/Orbits: Visualized orbits demonstrate no acute abnormality. Left sphenoid sinus mucous retention cyst. No significant mastoid effusion. CT CERVICAL SPINE FINDINGS Alignment: Nonspecific reversal of the expected cervical lordosis. No significant spondylolisthesis. Skull base and vertebrae: The basion-dental and atlanto-dental intervals are maintained.No evidence of acute fracture to the cervical spine. Soft tissues and spinal canal: No prevertebral fluid or swelling. No visible canal hematoma. Partially visualized support tubes. Disc levels: No significant bony spinal canal or neural foraminal narrowing at any level. Upper chest: No consolidation within the imaged lung apices. No visible pneumothorax. IMPRESSION: CT head: 1. No evidence of acute intracranial abnormality. 2. Left sphenoid sinus mucous retention cyst. CT cervical spine: 1. No evidence of acute fracture to the cervical spine. 2. Nonspecific reversal of the expected cervical lordosis, which may be positional. Electronically Signed   By: Kellie Simmering DO   On: 12/24/2019 12:10   CT Cervical Spine Wo Contrast  Result Date: 12/24/2019 CLINICAL DATA:  Head trauma, moderate/severe. Additional history provided: Patient brought in by ambulance from home with seizures, 2 witnessed tonic-clonic seizures with EMS, patient unresponsive, fall. EXAM: CT HEAD WITHOUT CONTRAST CT CERVICAL SPINE WITHOUT CONTRAST TECHNIQUE: Multidetector CT  imaging of the head and cervical spine was performed following the standard protocol without intravenous contrast. Multiplanar CT image reconstructions of the cervical spine were also generated. COMPARISON:  CT head 01/15/2019, CT head/maxillofacial/cervical spine 04/23/2018 FINDINGS: CT HEAD FINDINGS Brain: There is no evidence of acute intracranial hemorrhage, intracranial mass, midline shift or extra-axial fluid collection.No demarcated cortical infarction. Vascular: No hyperdense vessel. Skull: Normal. Negative for fracture or focal lesion. Sinuses/Orbits: Visualized  orbits demonstrate no acute abnormality. Left sphenoid sinus mucous retention cyst. No significant mastoid effusion. CT CERVICAL SPINE FINDINGS Alignment: Nonspecific reversal of the expected cervical lordosis. No significant spondylolisthesis. Skull base and vertebrae: The basion-dental and atlanto-dental intervals are maintained.No evidence of acute fracture to the cervical spine. Soft tissues and spinal canal: No prevertebral fluid or swelling. No visible canal hematoma. Partially visualized support tubes. Disc levels: No significant bony spinal canal or neural foraminal narrowing at any level. Upper chest: No consolidation within the imaged lung apices. No visible pneumothorax. IMPRESSION: CT head: 1. No evidence of acute intracranial abnormality. 2. Left sphenoid sinus mucous retention cyst. CT cervical spine: 1. No evidence of acute fracture to the cervical spine. 2. Nonspecific reversal of the expected cervical lordosis, which may be positional. Electronically Signed   By: Kellie Simmering DO   On: 12/24/2019 12:10   DG Chest Port 1 View  Result Date: 12/25/2019 CLINICAL DATA:  Hypoxia EXAM: PORTABLE CHEST 1 VIEW COMPARISON:  December 24, 2019 FINDINGS: Endotracheal tube tip is 6.4 cm above the carina. Nasogastric tube tip and side port are below the diaphragm. No pneumothorax. There is a small layering pleural effusion on the right. Lungs  elsewhere are clear. Heart size and pulmonary vascularity are normal. No adenopathy no bone lesions. IMPRESSION: Tube positions as described without pneumothorax. Small pleural effusion on the right. Lungs elsewhere clear. Cardiac silhouette within normal limits. Electronically Signed   By: Lowella Grip III M.D.   On: 12/25/2019 08:09   DG Chest Portable 1 View  Result Date: 12/24/2019 CLINICAL DATA:  Hypoxia EXAM: PORTABLE CHEST 1 VIEW COMPARISON:  October 15, 2008 FINDINGS: Endotracheal tube tip is 4.8 cm above the carina. Nasogastric tube tip and side port are in the stomach. No pneumothorax. The lungs are clear. The heart size and pulmonary vascularity are normal. No adenopathy. No bone lesions. IMPRESSION: Tube positions as described without pneumothorax. Lungs clear. Cardiac silhouette within normal limits. Electronically Signed   By: Lowella Grip III M.D.   On: 12/24/2019 11:23   Overnight EEG with video  Result Date: 12/25/2019 Lora Havens, MD     12/25/2019  8:12 AM Patient Name: Dylan Moon MRN: [id number] Epilepsy Attending: Lora Havens Referring Physician/Provider: Dr. Kerney Elbe Duration: 12/24/2019 1310 to 12/25/2019 0800  Patient history: 24 year old male with history of epilepsy presented in status epilepticus.  EEG to evaluate for subclinical seizures.  Level of alertness: Comatose/sedated  AEDs during EEG study: Depakote, Versed  Technical aspects: This EEG study was done with scalp electrodes positioned according to the 10-20 International system of electrode placement. Electrical activity was acquired at a sampling rate of '500Hz'  and reviewed with a high frequency filter of '70Hz'  and a low frequency filter of '1Hz' . EEG data were recorded continuously and digitally stored.  Description: EEG showed continuous generalized 2 to 3 Hz delta activity with overriding 15 to 18 Hz, 2-3 uV beta activity distributed symmetrically and diffusely. Sharp waves were noted in left  frontotemporal region as well as sharp transients in right frontotemporal region. EEG was reactive to tactile stimulation. Hyperventilation and photic stimulation were not performed.  Abnormality - Sharp waves, left frontotemporal region - Continuous slow, generalized - Excessive beta, generalized  IMPRESSION: This study is suggestive of epileptogenicity in left frontotemporal region. Additionally, there is evidence of severe diffuse encephalopathy, nonspecific to etiology but most likely secondary to sedation. No seizures were seen throughout the recording. Priyanka Barbra Sarks    Medications:  Scheduled: . chlorhexidine gluconate (MEDLINE KIT)  15 mL Mouth Rinse BID  . Chlorhexidine Gluconate Cloth  6 each Topical Daily  . enoxaparin (LOVENOX) injection  40 mg Subcutaneous Q24H  . Eslicarbazepine Acetate  1,200 mg Per Tube Daily  . insulin aspart  0-6 Units Subcutaneous Q4H  . mouth rinse  15 mL Mouth Rinse 10 times per day  . valproic acid  1,000 mg Per Tube TID   Continuous: . famotidine (PEPCID) IV 20 mg (12/25/19 0836)  . lactated ringers with kcl 100 mL/hr at 12/25/19 0600  . midazolam 10 mg/hr (12/25/19 0710)  . propofol (DIPRIVAN) infusion 30 mcg/kg/min (12/24/19 1157)    Laurey Morale, MSN, NP-C [facility] [phone]   Assessment: 24 year old male with known seizure disorder presenting with status epilepticus, now resolved on Versed sedation. Valproic acid level obtained in the ED was subtherapeutic.  1. Currently intubated and sedated on Versed increased back to 20 mg/hr '@0918' . Propofol drip remains at 79mg/kg/min @ 0900 (at time of NP exam).  2. Exam reveals pinpoint non-reactive pupils. No responses to noxious limb stimuli bilaterally, or to sternal rub. 3. CT head with no evidence of acute intracranial abnormality. 4. CT cervical spine with no evidence of acute fracture. 5. LTM EEG report for this AM: Abnormalities: Sharp waves, left frontotemporal  region, continuous generalized slowing and generalized excessive beta attributable to Versed. IMPRESSION: The study issuggestive of epileptogenicity in left frontotemporal region. Additionally, there is evidence of severe diffuse encephalopathy, nonspecific to etiology but most likely secondary to sedation.No seizures were seen throughout the recording. 6. Fever of 100.4. Per CCM no evidence for infection. Mild fever felt most likely to be due to seizures. 731 Mother emphatically states that patient cannot be withdrawing from EtOH as he is not a drinker.  8. During follow up exam by attending today, when propofol is held, but while still on Versed at a rate of 20, the patient begins to exhibit hiccups at about one every 5-10 seconds, then begins to have intermittent spells of varying length lasting about 1-3 seconds each during which he weakly internally rotates his BUE and BLE with low amplitude high frequency shivering-like movements of his arms.  9. Additional history is obtained from the mother. He has had several ED admissions for seizures and has been combative during the admissions, often striking at staff and requiring restraints. Has a tendency to pull off restraints in the past. Cannot have Keppra as it makes him "psychotic" per mother. She also now reveals that the patient has been having compliance issues with his medications, sometimes missing doses and requiring mother to start "laying out" his medications for him every day. She does not on a regular basis actually watch him take the pills that she gives him, however. Mother states VPA is 3000 mg per day on the bottle but his dose was recently changed to 2500 mg daily [facility]   Recommendations: 1. Versed drip continue at a rate of 20. Continuing LTM. Propofol stopped after follow up attending exam.  2. Continue valproic acid solution per OGT 1000 mg TID. His level was low on admission, so will continue at the 3000 mg  per day dosing instead of the 2500 mg per day dosing endorsed by his mother.  3. Repeat VPA level today was 134. Uncertain if this was from the load or not. Will obtain repeat level for this evening and for tomorrow AM.   4. Continue eslicarbazepine (  not on formulary, were brought in by patient's mother): One and a half 800 mg tablets per OGT qd.   Addendum:  -- LTM EEG reviewed by Dr. Hortense Ramal. The spells of shivering-like movements with hiccups show no electrographic correlate.  -- Spells of shivering like movements then stopped about 20 minutes after they were initially noted and have not recurred as of 1:15 PM.  -- Per Dr. Hortense Ramal, can now taper off Versed. Sedation will be covered by Precedex - RN calling CCM for orders. Will start tapering off Versed once Precedex is started.    A total of 85 minutes was spent in the Neurological evaluation and management of this critically ill patient. Time spent included 3 separate trips to the bedside today, EEG review, review of imaging studies, discussion of plan of care with family on 3 separate occasions and coordination of care with CCM.   Electronically signed: Dr. Kerney Elbe  LOS: 1 day   '@Electronically'  signed: Dr. Kerney Elbe 12/25/2019  8:49 AM

## 2019-12-26 ENCOUNTER — Inpatient Hospital Stay (HOSPITAL_COMMUNITY): Payer: Medicaid Other

## 2019-12-26 DIAGNOSIS — G9341 Metabolic encephalopathy: Secondary | ICD-10-CM

## 2019-12-26 DIAGNOSIS — R410 Disorientation, unspecified: Secondary | ICD-10-CM

## 2019-12-26 LAB — BASIC METABOLIC PANEL
Anion gap: 15 (ref 5–15)
BUN: 26 mg/dL — ABNORMAL HIGH (ref 6–20)
CO2: 20 mmol/L — ABNORMAL LOW (ref 22–32)
Calcium: 8.8 mg/dL — ABNORMAL LOW (ref 8.9–10.3)
Chloride: 111 mmol/L (ref 98–111)
Creatinine, Ser: 1.77 mg/dL — ABNORMAL HIGH (ref 0.61–1.24)
GFR calc Af Amer: >60 mL/min (ref >60)
GFR calc non Af Amer: 53 mL/min — ABNORMAL LOW (ref >60)
Glucose, Bld: 136 mg/dL — ABNORMAL HIGH (ref 70–99)
Potassium: 4.7 mmol/L (ref 3.5–5.1)
Sodium: 146 mmol/L — ABNORMAL HIGH (ref 135–145)

## 2019-12-26 LAB — CBC
HCT: 43.4 % (ref 39.0–52.0)
Hemoglobin: 14.1 g/dL (ref 13.0–17.0)
MCH: 28.8 pg (ref 26.0–34.0)
MCHC: 32.5 g/dL (ref 30.0–36.0)
MCV: 88.8 fL (ref 80.0–100.0)
Platelets: 160 10*3/uL (ref 150–400)
RBC: 4.89 MIL/uL (ref 4.22–5.81)
RDW: 14.1 % (ref 11.5–15.5)
WBC: 9.6 10*3/uL (ref 4.0–10.5)
nRBC: 0 % (ref 0.0–0.2)

## 2019-12-26 LAB — VALPROIC ACID LEVEL: Valproic Acid Lvl: 139 ug/mL — ABNORMAL HIGH (ref 50.0–100.0)

## 2019-12-26 LAB — GLUCOSE, CAPILLARY
Glucose-Capillary: 126 mg/dL — ABNORMAL HIGH (ref 70–99)
Glucose-Capillary: 118 mg/dL — ABNORMAL HIGH (ref 70–99)
Glucose-Capillary: 106 mg/dL — ABNORMAL HIGH (ref 70–99)
Glucose-Capillary: 109 mg/dL — ABNORMAL HIGH (ref 70–99)
Glucose-Capillary: 111 mg/dL — ABNORMAL HIGH (ref 70–99)

## 2019-12-26 LAB — PHOSPHORUS
Phosphorus: 2.8 mg/dL (ref 2.5–4.6)
Phosphorus: 3 mg/dL (ref 2.5–4.6)

## 2019-12-26 LAB — MAGNESIUM
Magnesium: 2.3 mg/dL (ref 1.7–2.4)
Magnesium: 2.1 mg/dL (ref 1.7–2.4)

## 2019-12-26 MED ORDER — HALOPERIDOL LACTATE 5 MG/ML IJ SOLN
INTRAMUSCULAR | Status: AC
  Administered 2019-12-26: 5 mg
  Filled 2019-12-26: qty 1

## 2019-12-26 MED ORDER — ROCURONIUM BROMIDE 50 MG/5ML IV SOLN
100.0000 mg | Freq: Once | INTRAVENOUS | Status: DC
  Filled 2019-12-26: qty 10

## 2019-12-26 MED ORDER — QUETIAPINE FUMARATE 25 MG PO TABS
25.0000 mg | ORAL_TABLET | Freq: Two times a day (BID) | ORAL | Status: DC
  Filled 2019-12-26: qty 1

## 2019-12-26 MED ORDER — HALOPERIDOL LACTATE 5 MG/ML IJ SOLN
5.0000 mg | Freq: Four times a day (QID) | INTRAMUSCULAR | Status: DC | PRN
  Administered 2019-12-26 – 2019-12-28 (×3): 5 mg via INTRAVENOUS
  Filled 2019-12-26: qty 1

## 2019-12-26 MED ORDER — CLONAZEPAM 0.5 MG PO TBDP
1.0000 mg | ORAL_TABLET | Freq: Three times a day (TID) | ORAL | Status: DC
  Administered 2019-12-26 – 2019-12-28 (×6): 1 mg
  Filled 2019-12-26 (×6): qty 2

## 2019-12-26 MED ORDER — CLONAZEPAM 0.1 MG/ML ORAL SUSPENSION: 1.0000 mg | Freq: Three times a day (TID) | ORAL | Status: DC

## 2019-12-26 MED ORDER — MIDAZOLAM BOLUS VIA INFUSION
2.0000 mg | INTRAVENOUS | Status: DC | PRN
  Administered 2019-12-27: 19:00:00 2 mg via INTRAVENOUS
  Filled 2019-12-26: qty 2

## 2019-12-26 MED ORDER — QUETIAPINE FUMARATE 25 MG PO TABS
25.0000 mg | ORAL_TABLET | Freq: Two times a day (BID) | ORAL | Status: DC
  Administered 2019-12-26 – 2019-12-28 (×4): 25 mg
  Filled 2019-12-26 (×4): qty 1

## 2019-12-26 NOTE — Progress Notes (Signed)
Patient Update:  Earlier this afternoon around 1400 patient was on PS trial 15/5 for 30 minutes and I started to lighten his sedation(was only on dexmedetomidine at 0.4) which I turned off. I weaned him to 10/5 for PS trial and he was RAAS -2 at this time. Was called urgently to bedside as patient became acutely agitated RASS +5 requiring 6-8 staff members to hold him down. Required boluses of versed, resuming versed gtt, resuming precedex gtt, haldol pushes, and ultimately required resuming propofol to make him calm. He was not ready for extubation at this time given tachypnea, nasal flaring, and requiring still 10/5 of PS. Plan will be to continue PS trials, wean to 5/5. Goal will be to continue PS trial and ultimate wean off sedation (propfol first, then versed, and extubate while on precedex.)  The patient is critically ill with multiple organ systems failure and requires high complexity decision making for assessment and support, frequent evaluation and titration of therapies, application of advanced monitoring technologies and extensive interpretation of multiple databases.   Critical Care Time devoted to patient care services described in this note is an additional 45 minutes. This time reflects time of care of this signee Dylan Moon . This critical care time does not reflect separately billable procedures or procedure time, teaching time or supervisory time of PA/NP/Med student/Med Resident etc but could involve care discussion time.  Dylan Moon [facility] 12/26/2019 4:37 PM  Pager: [phone] After hours pager: [phone]

## 2019-12-26 NOTE — Progress Notes (Signed)
NAME:  Dylan Moon, MRN:  [id number], DOB:  Apr 03, 1996, LOS: 2 ADMISSION DATE:  12/24/2019,  REFERRING MD:  EDP, CHIEF COMPLAINT:  Status epilepticus.  Brief History   Dylan Moon is a 24 y.o male with ADHD and epilepsy who presented to the emergency department with status epilepticus. He was subsequently intubated and admitted to the ICU for burst suppression. She states that he is compliant with all of his AEDs. He is currently on Depakote ER 3000 mg qhs and eslicarbazepine 800 mg tabs 1.5 tabs per day. The mother states that he typically has a seizure at least monthly. Possible etoh ingestion prior to admission.  Consults:  Neurology  Procedures:  Endotracheal intubation 4/2  Significant Diagnostic Tests:  CT head 4/2:  1. No evidence of acute intracranial abnormality. 2. Left sphenoid sinus mucous retention cyst.  CT cervical spine 4/2:  1. No evidence of acute fracture to the cervical spine. 2. Nonspecific reversal of the expected cervical lordosis, which may be positional.  EEG 4/3 Abnormality - Sharp waves, left frontotemporal region - Continuous slow, generalized - Excessive beta, generalized  IMPRESSION: This study issuggestive of epileptogenicity in left frontotemporal region. Additionally, there is evidence of severe diffuse encephalopathy, nonspecific to etiology but most likely secondary to sedation.No seizures were seen throughout the recording.  Micro Data:  MRSA 4/2 negative Blood cultures 4/2 pending Resp panel 4/2 NGTD  Antimicrobials:  none  Interim history/subjective:  EEG reviewed by neurology. Seizure-like movements did not correspond with seizures on EEG. He is following commands on precedex this morning. Versed is off. No overnight issues. Urine output is good.   Objective   Blood pressure (!) 145/82, pulse 70, temperature 99.2 F (37.3 C), temperature source Axillary, resp. rate 18, height 6' (1.829 m), weight 81.2 kg, SpO2 100 %.    Vent Mode: PRVC FiO2 (%):  [40 %] 40 % Set Rate:  [18 bmp] 18 bmp Vt Set:  [620 mL] 620 mL PEEP:  [5 cmH20] 5 cmH20 Plateau Pressure:  [18 cmH20-20 cmH20] 20 cmH20   Intake/Output Summary (Last 24 hours) at 12/26/2019 0946 Last data filed at 12/26/2019 0800 Gross per 24 hour  Intake 4001.65 ml  Output 2725 ml  Net 1276.65 ml   Filed Weights   12/24/19 1056 12/25/19 0500 12/26/19 0500  Weight: 81.6 kg 81.8 kg 81.2 kg    Examination: General: intubated, EEG device in place HENT: ETT to vent Lungs: CTAB Cardiovascular: RRR Abdomen: scaphoid, soft, normal bs Extremities: thin, no edema Neuro: RASS -1 MSK: no rashes Lines: PIV  Resolved Hospital Problem list     Assessment & Plan:  Dylan Moon is a 24 y.o. man with history of seizure disorder who presents with:  Seizure disorder with concern for breakthrough seizures - AED per neurology for burst suppression - on VPA, eslicarbazepine (home med) - propofol and versed off. EEG did not correspond with seizure like activity witnessed in ICU.  - unclear precipitant as AED levels were detected in the blood suggesting adherence to outpatient therapy.  Non-oliguric AKI - suspect secondary to urinary retention - UOP adequate. Repeat BMP  Acute hypoxemic respiratory failure - secondary to clinical coma from breakthrough seizures - continue lung protective ventilation - will perform SBT/SAT this morning and extubate. Only on precedex.   Best practice:  Diet: tube feeds Pain/Anxiety/Delirium protocol (if indicated): see above VAP protocol (if indicated): ordered DVT prophylaxis: lovenox GI prophylaxis: pepcid Glucose control: controlled<180 Foley: yes for retention Mobility: bedrest Code  Status: full Family Communication: Mother at bedside updated 4/3.  Disposition: needs ICU  Labs   CBC: Recent Labs  Lab 12/24/19 1046 12/24/19 1207 12/25/19 0400 12/25/19 0954  WBC 13.5*  --   --  10.3  NEUTROABS 4.9  --    --   --   HGB 17.2* 16.3 17.0 15.1  HCT 57.7* 48.0 50.0 46.0  MCV 96.8  --   --  87.6  PLT 271  --   --  182    Basic Metabolic Panel: Recent Labs  Lab 12/24/19 1046 12/24/19 1207 12/24/19 1713 12/25/19 0400 12/25/19 0549 12/25/19 0954 12/25/19 1626 12/26/19 0536  NA 138 137 140 139 139  --   --   --   K 4.6 5.3* 4.5 4.5 4.6  --   --   --   CL 98  --  104  --  104  --   --   --   CO2 10*  --  25  --  19*  --   --   --   GLUCOSE 216*  --  92  --  140*  --   --   --   BUN 16  --  18  --  20  --   --   --   CREATININE 1.59*  --  2.05*  --  2.37*  --   --   --   CALCIUM 9.9  --  9.1  --  9.0  --   --   --   MG 2.8*  --   --   --   --  2.4 2.4 2.3  PHOS 6.8*  --   --   --   --  2.7 2.9 2.8   GFR: Estimated Creatinine Clearance: 52.8 mL/min (A) (by C-G formula based on SCr of 2.37 mg/dL (H)). Recent Labs  Lab 12/24/19 1046 12/24/19 1322 12/24/19 1713 12/24/19 1817 12/25/19 0954  WBC 13.5*  --   --   --  10.3  LATICACIDVEN >11.0* 6.0* 2.0* 2.8*  --     Liver Function Tests: Recent Labs  Lab 12/24/19 1046  AST 34  ALT 19  ALKPHOS 51  BILITOT 0.1*  PROT 7.8  ALBUMIN 5.1*   Recent Labs  Lab 12/24/19 1046  LIPASE 26   No results for input(s): AMMONIA in the last 168 hours.  ABG    Component Value Date/Time   PHART 7.436 12/25/2019 0400   PCO2ART 32.4 12/25/2019 0400   PO2ART 107.0 12/25/2019 0400   HCO3 21.6 12/25/2019 0400   TCO2 23 12/25/2019 0400   ACIDBASEDEF 1.0 12/25/2019 0400   O2SAT 98.0 12/25/2019 0400     Coagulation Profile: Recent Labs  Lab 12/24/19 1046  INR 1.1    Cardiac Enzymes: No results for input(s): CKTOTAL, CKMB, CKMBINDEX, TROPONINI in the last 168 hours.  HbA1C: Hgb A1c MFr Bld  Date/Time Value Ref Range Status  12/24/2019 05:13 PM 5.8 (H) 4.8 - 5.6 % Final    Comment:    (NOTE) Pre diabetes:          5.7%-6.4% Diabetes:              >6.4% Glycemic control for   <7.0% adults with diabetes     CBG: Recent Labs    Lab 12/25/19 1602 12/25/19 1945 12/25/19 2319 12/26/19 0327 12/26/19 0814  GLUCAP 146* 106* 123* 126* 118*    Critical care time:   The patient is critically ill with multiple  organ systems failure and requires high complexity decision making for assessment and support, frequent evaluation and titration of therapies, application of advanced monitoring technologies and extensive interpretation of multiple databases.   Critical Care Time devoted to patient care services described in this note is 35 minutes. This time reflects the time of my personal involvement. This critical care time does not reflect separately billable procedures or procedure time, teaching time or supervisory time of PA/NP/Med student/Med Resident etc but could involve care discussion time.  Leone Haven [facility] Care [facility] 12/26/2019 9:46 AM  Pager: [phone] After hours pager: [phone]

## 2019-12-26 NOTE — Progress Notes (Signed)
LTM maint complete - no skin breakdown under:   Fp1, F3, F7, M1 no skin breakdown noted. Tech fixed CZ, Pz, P4

## 2019-12-26 NOTE — Progress Notes (Signed)
Assisted sister with camera/video time via elink 

## 2019-12-26 NOTE — Progress Notes (Signed)
Spoke with Wenda Low, RN c/o PICC order. Patient's needs may have changed. Patient will be out 48 hours from blood cultures this afternoon, so PICC need will be addressed at that time.

## 2019-12-26 NOTE — Progress Notes (Signed)
Assisted tele visit to patient with family member.  Correen Bubolz P, RN  

## 2019-12-26 NOTE — Progress Notes (Signed)
At bedside to place PICC line.  Consent obtained from mother at bedside 12/25/19.  Dr Celine Mans on unit, verbal order to cancel the PICC placement. Will reorder at later time if necessary. No family at bedside, Journalist, newspaper at bedside aware.

## 2019-12-26 NOTE — Procedures (Addendum)
Patient Name:Kamin TSUGIO ELISON ULG:[id number] Epilepsy Attending:Jahmarion Popoff Annabelle Harman Referring Physician/Provider:Dr. Wardell Heath Duration: 12/25/2019 1310 to 12/26/2019 1310  Patient history:24 year old male with history of epilepsy presented in status epilepticus.EEG to evaluate for subclinical seizures.  Level of alertness:Comatose/sedated  AEDs during EEG study:Depakote, Versed  Technical aspects: This EEG study was done with scalp electrodes positioned according to the 10-20 International system of electrode placement. Electrical activity was acquired at a sampling rate of 500Hz  and reviewed with a high frequency filter of 70Hz  and a low frequency filter of 1Hz . EEG data were recorded continuously and digitally stored.  Description:EEG showed continuous generalized 2 to 3 Hz delta activity with overriding 15 to 18 Hz, 2-3 uV beta activity distributed symmetrically and diffusely. EEG was reactive to tactile stimulation.Hyperventilation and photic stimulation were not performed.  Abnormality - Continuous slow, generalized - Excessive beta, generalized  IMPRESSION: This study issuggestive of severe diffuse encephalopathy, nonspecific to etiology but most likely secondary to sedation.No seizures or definite epileptiform discharges were seen throughout the recording.  Makaio Mach 

## 2019-12-26 NOTE — Progress Notes (Signed)
Assisted tele visit to patient with family member.  Neytiri Asche M, RN  

## 2019-12-26 NOTE — Progress Notes (Signed)
Subjective: Intubated and sedated with Precedex.   Objective: Current vital signs: BP (!) 145/82   Pulse 70   Temp 99.2 F (37.3 C) (Axillary)   Resp 18   Ht 6' (1.829 m)   Wt 81.2 kg   SpO2 100%   BMI 24.28 kg/m  Vital signs in last 24 hours: Temp:  [98.7 F (37.1 C)-100.1 F (37.8 C)] 99.2 F (37.3 C) (04/04 0800) Pulse Rate:  [68-94] 70 (04/04 0822) Resp:  [18] 18 (04/04 0822) BP: (105-146)/(59-87) 145/82 (04/04 0822) SpO2:  [100 %] 100 % (04/04 0822) FiO2 (%):  [40 %] 40 % (04/04 0822) Weight:  [81.2 kg] 81.2 kg (04/04 0500)  Intake/Output from previous day: 04/03 0701 - 04/04 0700 In: 4098.1 [I.V.:3084.8; NG/GT:779.3; IV Piggyback:100] Out: 2725 [Urine:1600; Emesis/NG output:1125] Intake/Output this shift: Total I/O In: 152.2 [I.V.:112.2; NG/GT:40] Out: -  Nutritional status:  Diet Order            Diet NPO time specified  Diet effective now             HEENT: [place]/AT Lungs: Intubated Ext: No edema  Neurologic Exam: Ment: Intubated and sedated on Precedex. Will open eyes after a delay following sternal rub. Will follow simple commands after a significant delay. Not tracking visually or attempting to communicate.  CN: PERRL. Not blinking to threat. Face flaccidly symmetric.  Motor: Will move BUE and BLE slightly to commands.  Sensory: Wiggles feet when touched.  Reflexes: Hypoactive throughout.   Lab Results: Results for orders placed or performed during the hospital encounter of 12/24/19 (from the past 48 hour(s))  CBG monitoring, ED     Status: Abnormal   Collection Time: 12/24/19 10:39 AM  Result Value Ref Range   Glucose-Capillary 200 (H) 70 - 99 mg/dL    Comment: Glucose reference range applies only to samples taken after fasting for at least 8 hours.  Comprehensive metabolic panel     Status: Abnormal   Collection Time: 12/24/19 10:46 AM  Result Value Ref Range   Sodium 138 135 - 145 mmol/L   Potassium 4.6 3.5 - 5.1 mmol/L   Chloride 98 98 -  111 mmol/L   CO2 10 (L) 22 - 32 mmol/L   Glucose, Bld 216 (H) 70 - 99 mg/dL    Comment: Glucose reference range applies only to samples taken after fasting for at least 8 hours.   BUN 16 6 - 20 mg/dL   Creatinine, Ser 1.59 (H) 0.61 - 1.24 mg/dL   Calcium 9.9 8.9 - 10.3 mg/dL   Total Protein 7.8 6.5 - 8.1 g/dL   Albumin 5.1 (H) 3.5 - 5.0 g/dL   AST 34 15 - 41 U/L   ALT 19 0 - 44 U/L   Alkaline Phosphatase 51 38 - 126 U/L   Total Bilirubin 0.1 (L) 0.3 - 1.2 mg/dL   GFR calc non Af Amer 60 (L) >60 mL/min   GFR calc Af Amer >60 >60 mL/min   Anion gap 30 (H) 5 - 15    Comment: Performed at [facility] [address], [place], [place] [address]  Ethanol     Status: None   Collection Time: 12/24/19 10:46 AM  Result Value Ref Range   Alcohol, Ethyl (B) <10 <10 mg/dL    Comment: (NOTE) Lowest detectable limit for serum alcohol is 10 mg/dL. For medical purposes only. Performed at [facility], [place] [address], [place],  [place] [address]   Lipase, blood  Status: None   Collection Time: 12/24/19 10:46 AM  Result Value Ref Range   Lipase 26 11 - 51 U/L    Comment: Performed at [facility], [place] [address] [place], [place] [address]  Brain natriuretic peptide     Status: None   Collection Time: 12/24/19 10:46 AM  Result Value Ref Range   B Natriuretic Peptide 76.0 0.0 - 100.0 pg/mL    Comment: Performed at [facility] [address], [place], [place] [address]  Troponin I (High Sensitivity)     Status: None   Collection Time: 12/24/19 10:46 AM  Result Value Ref Range   Troponin I (High Sensitivity) 7 <18 ng/L    Comment: (NOTE) Elevated high sensitivity troponin I (hsTnI) values and significant  changes across serial measurements may suggest ACS but many other  chronic and acute conditions are known to elevate hsTnI results.  Refer to the Links section for chest pain algorithms and additional  guidance. Performed at [facility], [place] [address], [place], [place] [address]   Lactic acid, plasma     Status: Abnormal   Collection Time: 12/24/19 10:46 AM  Result Value Ref Range   Lactic Acid, Venous >11.0 (HH) 0.5 - 1.9 mmol/L    Comment: CRITICAL RESULT CALLED TO, READ BACK BY AND VERIFIED WITH: Rhona Leavens RN [phone] BY A BENNETT Performed at [facility], [place] [address], [place], [place] [address]   CBC with Differential     Status: Abnormal   Collection Time: 12/24/19 10:46 AM  Result Value Ref Range   WBC 13.5 (H) 4.0 - 10.5 K/uL   RBC 5.96 (H) 4.22 - 5.81 MIL/uL   Hemoglobin 17.2 (H) 13.0 - 17.0 g/dL   HCT 57.7 (H) 39.0 - 52.0 %   MCV 96.8 80.0 - 100.0 fL   MCH 28.9 26.0 - 34.0 pg   MCHC 29.8 (L) 30.0 - 36.0 g/dL   RDW 13.5 11.5 - 15.5 %   Platelets 271 150 - 400 K/uL   nRBC 0.0 0.0 - 0.2 %   Neutrophils Relative % 37 %   Neutro Abs 4.9 1.7 - 7.7 K/uL   Lymphocytes Relative 51 %   Lymphs Abs 6.9 (H) 0.7 - 4.0 K/uL   Monocytes Relative 8 %   Monocytes Absolute 1.1 (H) 0.1 - 1.0 K/uL   Eosinophils Relative 1 %   Eosinophils Absolute 0.1 0.0 - 0.5 K/uL   Basophils Relative 1 %   Basophils Absolute 0.1 0.0 - 0.1 K/uL   Immature Granulocytes 2 %   Abs Immature Granulocytes 0.33 (H) 0.00 - 0.07 K/uL    Comment: Performed at [facility] [address], [place], [place] [address]  Protime-INR     Status: None   Collection Time: 12/24/19 10:46 AM  Result Value Ref Range   Prothrombin Time 13.8 11.4 - 15.2 seconds   INR 1.1 0.8 - 1.2    Comment: (NOTE) INR goal varies based on device and disease states. Performed at [facility], [place] [address] [place], [place] [address]   Magnesium     Status: Abnormal   Collection Time: 12/24/19 10:46 AM  Result Value Ref Range   Magnesium 2.8 (H) 1.7 - 2.4 mg/dL    Comment: Performed at [facility] [address] [place], [place] [address]  Phosphorus     Status: Abnormal   Collection Time: 12/24/19 10:46 AM  Result  Value Ref Range   Phosphorus  6.8 (H) 2.5 - 4.6 mg/dL    Comment: Performed at [facility] [address] [place], [place] [address]  TSH     Status: None   Collection Time: 12/24/19 10:46 AM  Result Value Ref Range   TSH 2.542 0.350 - 4.500 uIU/mL    Comment: Performed by a 3rd Generation assay with a functional sensitivity of <=0.01 uIU/mL. Performed at [facility], [place] [address], [place], [place] [address]   Valproic acid level     Status: Abnormal   Collection Time: 12/24/19 10:46 AM  Result Value Ref Range   Valproic Acid Lvl 39 (L) 50.0 - 100.0 ug/mL    Comment: Performed at [facility] [address], [place], [place] [address]  Triglycerides     Status: None   Collection Time: 12/24/19 10:46 AM  Result Value Ref Range   Triglycerides 119 <150 mg/dL    Comment: Performed at [facility] [address], [place], [place] [address]  Urinalysis, Routine w reflex microscopic     Status: Abnormal   Collection Time: 12/24/19 11:03 AM  Result Value Ref Range   Color, Urine STRAW (A) YELLOW   APPearance HAZY (A) CLEAR   Specific Gravity, Urine 1.015 1.005 - 1.030   pH 5.0 5.0 - 8.0   Glucose, UA 50 (A) NEGATIVE mg/dL   Hgb urine dipstick MODERATE (A) NEGATIVE   Bilirubin Urine NEGATIVE NEGATIVE   Ketones, ur NEGATIVE NEGATIVE mg/dL   Protein, ur >=300 (A) NEGATIVE mg/dL   Nitrite NEGATIVE NEGATIVE   Leukocytes,Ua NEGATIVE NEGATIVE   RBC / HPF 0-5 0 - 5 RBC/hpf   WBC, UA 0-5 0 - 5 WBC/hpf   Bacteria, UA RARE (A) NONE SEEN   Squamous Epithelial / LPF 0-5 0 - 5   Mucus PRESENT    Granular Casts, UA PRESENT    Amorphous Crystal PRESENT     Comment: Performed at [facility], [place] [address] [place], [place] [address]  Urine rapid drug screen (hosp performed)     Status: Abnormal   Collection Time: 12/24/19 11:03 AM  Result Value Ref Range   Opiates NONE DETECTED NONE DETECTED   Cocaine NONE DETECTED NONE DETECTED   Benzodiazepines POSITIVE (A) NONE DETECTED    Amphetamines NONE DETECTED NONE DETECTED   Tetrahydrocannabinol POSITIVE (A) NONE DETECTED   Barbiturates NONE DETECTED NONE DETECTED    Comment: (NOTE) DRUG SCREEN FOR MEDICAL PURPOSES ONLY.  IF CONFIRMATION IS NEEDED FOR ANY PURPOSE, NOTIFY LAB WITHIN 5 DAYS. LOWEST DETECTABLE LIMITS FOR URINE DRUG SCREEN Drug Class                     Cutoff (ng/mL) Amphetamine and metabolites    1000 Barbiturate and metabolites    200 Benzodiazepine                 329 Tricyclics and metabolites     300 Opiates and metabolites        300 Cocaine and metabolites        300 THC                            50 Performed at [facility], [place] [address], [place], [place] [address]   I-STAT 7, (LYTES, BLD GAS, ICA, H+H)     Status: Abnormal   Collection Time:  12/24/19 12:07 PM  Result Value Ref Range   pH, Arterial 7.309 (L) 7.350 - 7.450   pCO2 arterial 43.1 32.0 - 48.0 mmHg   pO2, Arterial 155.0 (H) 83.0 - 108.0 mmHg   Bicarbonate 21.6 20.0 - 28.0 mmol/L   TCO2 23 22 - 32 mmol/L   O2 Saturation 99.0 %   Acid-base deficit 5.0 (H) 0.0 - 2.0 mmol/L   Sodium 137 135 - 145 mmol/L   Potassium 5.3 (H) 3.5 - 5.1 mmol/L   Calcium, Ion 1.23 1.15 - 1.40 mmol/L   HCT 48.0 39.0 - 52.0 %   Hemoglobin 16.3 13.0 - 17.0 g/dL   Patient temperature HIDE    Sample type ARTERIAL   Lactic acid, plasma     Status: Abnormal   Collection Time: 12/24/19  1:22 PM  Result Value Ref Range   Lactic Acid, Venous 6.0 (HH) 0.5 - 1.9 mmol/L    Comment: CRITICAL VALUE NOTED.  VALUE IS CONSISTENT WITH PREVIOUSLY REPORTED AND CALLED VALUE. Performed at [facility], [place] [address] [place] [address] [place], [place] [address]   Troponin I (High Sensitivity)     Status: Abnormal   Collection Time: 12/24/19  1:22 PM  Result Value Ref Range   Troponin I (High Sensitivity) 24 (H) <18 ng/L    Comment: (NOTE) Elevated high sensitivity troponin I (hsTnI) values and significant  changes across serial measurements may suggest ACS but  many other  chronic and acute conditions are known to elevate hsTnI results.  Refer to the "Links" section for chest pain algorithms and additional  guidance. Performed at [facility], [place] [address] [place], [place] [address]   Respiratory Panel by RT PCR (Flu A&B, Covid) - Nasopharyngeal Swab     Status: None   Collection Time: 12/24/19  3:23 PM   Specimen: Nasopharyngeal Swab  Result Value Ref Range   SARS Coronavirus 2 by RT PCR NEGATIVE NEGATIVE    Comment: (NOTE) SARS-CoV-2 target nucleic acids are NOT DETECTED. The SARS-CoV-2 RNA is generally detectable in upper respiratoy specimens during the acute phase of infection. The lowest concentration of SARS-CoV-2 viral copies this assay can detect is 131 copies/mL. A negative result does not preclude SARS-Cov-2 infection and should not be used as the sole basis for treatment or other patient management decisions. A negative result may occur with  improper specimen collection/handling, submission of specimen other than nasopharyngeal swab, presence of viral mutation(s) within the areas targeted by this assay, and inadequate number of viral copies (<131 copies/mL). A negative result must be combined with clinical observations, patient history, and epidemiological information. The expected result is Negative. Fact Sheet for Patients:  PinkCheek.be Fact Sheet for Healthcare Providers:  GravelBags.it This test is not yet ap proved or cleared by the [place] FDA and  has been authorized for detection and/or diagnosis of SARS-CoV-2 by FDA under an Emergency Use Authorization (EUA). This EUA will remain  in effect (meaning this test can be used) for the duration of the COVID-19 declaration under Section 564(b)(1) of the Act, 21 U.S.C. section 360bbb-3(b)(1), unless the authorization is terminated or revoked sooner.    Influenza A by PCR NEGATIVE NEGATIVE   Influenza  B by PCR NEGATIVE NEGATIVE    Comment: (NOTE) The Xpert Xpress SARS-CoV-2/FLU/RSV assay is intended as an aid in  the diagnosis of influenza from Nasopharyngeal swab specimens and  should not be used as a sole basis for treatment. Nasal washings and  aspirates are unacceptable for  Xpert Xpress SARS-CoV-2/FLU/RSV  testing. Fact Sheet for Patients: PinkCheek.be Fact Sheet for Healthcare Providers: GravelBags.it This test is not yet approved or cleared by the [place] FDA and  has been authorized for detection and/or diagnosis of SARS-CoV-2 by  FDA under an Emergency Use Authorization (EUA). This EUA will remain  in effect (meaning this test can be used) for the duration of the  Covid-19 declaration under Section 564(b)(1) of the Act, 21  U.S.C. section 360bbb-3(b)(1), unless the authorization is  terminated or revoked. Performed at [facility], [place] [address] [place], [place] [address]   MRSA PCR Screening     Status: None   Collection Time: 12/24/19  4:30 PM   Specimen: Nasopharyngeal  Result Value Ref Range   MRSA by PCR NEGATIVE NEGATIVE    Comment:        The GeneXpert MRSA Assay (FDA approved for NASAL specimens only), is one component of a comprehensive MRSA colonization surveillance program. It is not intended to diagnose MRSA infection nor to guide or monitor treatment for MRSA infections. Performed at [facility], [place] [address] [place], [place] [address]   Basic metabolic panel     Status: Abnormal   Collection Time: 12/24/19  5:13 PM  Result Value Ref Range   Sodium 140 135 - 145 mmol/L   Potassium 4.5 3.5 - 5.1 mmol/L    Comment: SLIGHT HEMOLYSIS   Chloride 104 98 - 111 mmol/L   CO2 25 22 - 32 mmol/L   Glucose, Bld 92 70 - 99 mg/dL    Comment: Glucose reference range applies only to samples taken after fasting for at least 8 hours.   BUN 18 6 - 20 mg/dL   Creatinine, Ser 2.05 (H)  0.61 - 1.24 mg/dL   Calcium 9.1 8.9 - 10.3 mg/dL   GFR calc non Af Amer 44 (L) >60 mL/min   GFR calc Af Amer 51 (L) >60 mL/min   Anion gap 11 5 - 15    Comment: Performed at [facility] [address], [place], [place] [address]  Hemoglobin A1c     Status: Abnormal   Collection Time: 12/24/19  5:13 PM  Result Value Ref Range   Hgb A1c MFr Bld 5.8 (H) 4.8 - 5.6 %    Comment: (NOTE) Pre diabetes:          5.7%-6.4% Diabetes:              >6.4% Glycemic control for   <7.0% adults with diabetes    Mean Plasma Glucose 119.76 mg/dL    Comment: Performed at [facility] [address] [place], [place] [address]  Lactic acid, plasma     Status: Abnormal   Collection Time: 12/24/19  5:13 PM  Result Value Ref Range   Lactic Acid, Venous 2.0 (HH) 0.5 - 1.9 mmol/L    Comment: CRITICAL VALUE NOTED.  VALUE IS CONSISTENT WITH PREVIOUSLY REPORTED AND CALLED VALUE. Performed at [facility], [place] [address], [place], [place] [address]   Culture, blood (routine x 2)     Status: None (Preliminary result)   Collection Time: 12/24/19  6:06 PM   Specimen: BLOOD  Result Value Ref Range   Specimen Description BLOOD LEFT ANTECUBITAL    Special Requests      BOTTLES DRAWN AEROBIC AND ANAEROBIC Blood Culture results may not be optimal due to an inadequate volume of blood received in culture bottles   Culture  NO GROWTH 2 DAYS Performed at [facility], [place] [address] [place], [place] [address]    Report Status PENDING   Culture, blood (routine x 2)     Status: None (Preliminary result)   Collection Time: 12/24/19  6:14 PM   Specimen: BLOOD LEFT FOREARM  Result Value Ref Range   Specimen Description BLOOD LEFT FOREARM    Special Requests      BOTTLES DRAWN AEROBIC AND ANAEROBIC Blood Culture adequate volume   Culture      NO GROWTH 2 DAYS Performed at [facility], [place] [address], [place], [place] [address]    Report Status PENDING   Lactic acid, plasma      Status: Abnormal   Collection Time: 12/24/19  6:17 PM  Result Value Ref Range   Lactic Acid, Venous 2.8 (HH) 0.5 - 1.9 mmol/L    Comment: CRITICAL VALUE NOTED.  VALUE IS CONSISTENT WITH PREVIOUSLY REPORTED AND CALLED VALUE. Performed at [facility], [place] [address], [place], [place] [address]   Culture, Urine     Status: None   Collection Time: 12/24/19  6:38 PM   Specimen: Urine, Random  Result Value Ref Range   Specimen Description URINE, RANDOM    Special Requests NONE    Culture      NO GROWTH Performed at [facility], [place] [address] [place], [place] [address]    Report Status 12/25/2019 FINAL   Glucose, capillary     Status: Abnormal   Collection Time: 12/24/19  7:29 PM  Result Value Ref Range   Glucose-Capillary 100 (H) 70 - 99 mg/dL    Comment: Glucose reference range applies only to samples taken after fasting for at least 8 hours.  Glucose, capillary     Status: None   Collection Time: 12/24/19 11:27 PM  Result Value Ref Range   Glucose-Capillary 90 70 - 99 mg/dL    Comment: Glucose reference range applies only to samples taken after fasting for at least 8 hours.  Glucose, capillary     Status: Abnormal   Collection Time: 12/25/19  3:23 AM  Result Value Ref Range   Glucose-Capillary 111 (H) 70 - 99 mg/dL    Comment: Glucose reference range applies only to samples taken after fasting for at least 8 hours.  I-STAT 7, (LYTES, BLD GAS, ICA, H+H)     Status: None   Collection Time: 12/25/19  4:00 AM  Result Value Ref Range   pH, Arterial 7.436 7.350 - 7.450   pCO2 arterial 32.4 32.0 - 48.0 mmHg   pO2, Arterial 107.0 83.0 - 108.0 mmHg   Bicarbonate 21.6 20.0 - 28.0 mmol/L   TCO2 23 22 - 32 mmol/L   O2 Saturation 98.0 %   Acid-base deficit 1.0 0.0 - 2.0 mmol/L   Sodium 139 135 - 145 mmol/L   Potassium 4.5 3.5 - 5.1 mmol/L   Calcium, Ion 1.18 1.15 - 1.40 mmol/L   HCT 50.0 39.0 - 52.0 %   Hemoglobin 17.0 13.0 - 17.0 g/dL   Patient temperature 100.5 F     Collection site RADIAL, ALLEN'S TEST ACCEPTABLE    Drawn by Operator    Sample type ARTERIAL   Basic metabolic panel     Status: Abnormal   Collection Time: 12/25/19  5:49 AM  Result Value Ref Range   Sodium 139 135 - 145 mmol/L   Potassium 4.6 3.5 - 5.1 mmol/L   Chloride 104 98 - 111  mmol/L   CO2 19 (L) 22 - 32 mmol/L   Glucose, Bld 140 (H) 70 - 99 mg/dL    Comment: Glucose reference range applies only to samples taken after fasting for at least 8 hours.   BUN 20 6 - 20 mg/dL   Creatinine, Ser 2.37 (H) 0.61 - 1.24 mg/dL   Calcium 9.0 8.9 - 10.3 mg/dL   GFR calc non Af Amer 37 (L) >60 mL/min   GFR calc Af Amer 43 (L) >60 mL/min   Anion gap 16 (H) 5 - 15    Comment: Performed at [facility] [address] [place], [place] [address]  Glucose, capillary     Status: None   Collection Time: 12/25/19  8:48 AM  Result Value Ref Range   Glucose-Capillary 95 70 - 99 mg/dL    Comment: Glucose reference range applies only to samples taken after fasting for at least 8 hours.  CBC     Status: None   Collection Time: 12/25/19  9:54 AM  Result Value Ref Range   WBC 10.3 4.0 - 10.5 K/uL   RBC 5.25 4.22 - 5.81 MIL/uL   Hemoglobin 15.1 13.0 - 17.0 g/dL   HCT 46.0 39.0 - 52.0 %   MCV 87.6 80.0 - 100.0 fL    Comment: RESULTS VERIFIED VIA RECOLLECT   MCH 28.8 26.0 - 34.0 pg   MCHC 32.8 30.0 - 36.0 g/dL   RDW 14.1 11.5 - 15.5 %   Platelets 182 150 - 400 K/uL   nRBC 0.0 0.0 - 0.2 %    Comment: Performed at [facility], [place] [address] [place], [place] [address]  Valproic acid level     Status: Abnormal   Collection Time: 12/25/19  9:54 AM  Result Value Ref Range   Valproic Acid Lvl 134 (H) 50.0 - 100.0 ug/mL    Comment: Performed at [facility] [address], [place], [place] [address]  Magnesium     Status: None   Collection Time: 12/25/19  9:54 AM  Result Value Ref Range   Magnesium 2.4 1.7 - 2.4 mg/dL    Comment: Performed at [facility] [address], [place], [place] [address]  Phosphorus     Status: None   Collection Time: 12/25/19  9:54 AM  Result Value Ref Range   Phosphorus 2.7 2.5 - 4.6 mg/dL    Comment: Performed at [facility] [address], [place],  [address]  Urinalysis, Routine w reflex microscopic     Status: Abnormal   Collection Time: 12/25/19 11:01 AM  Result Value Ref Range   Color, Urine YELLOW YELLOW   APPearance CLOUDY (A) CLEAR   Specific Gravity, Urine 1.016 1.005 - 1.030   pH 5.0 5.0 - 8.0   Glucose, UA NEGATIVE NEGATIVE mg/dL   Hgb urine dipstick MODERATE (A) NEGATIVE   Bilirubin Urine NEGATIVE NEGATIVE   Ketones, ur NEGATIVE NEGATIVE mg/dL   Protein, ur 30 (A) NEGATIVE mg/dL   Nitrite NEGATIVE NEGATIVE   Leukocytes,Ua NEGATIVE NEGATIVE   RBC / HPF >50 (H) 0 - 5 RBC/hpf   WBC, UA 6-10 0 - 5 WBC/hpf   Bacteria, UA RARE (A) NONE SEEN   Squamous Epithelial / LPF 0-5 0 - 5   Mucus PRESENT    Uric Acid Crys, UA PRESENT     Comment: Performed at [facility], [place] [address], [place], [place] [address]  Glucose, capillary  Status: Abnormal   Collection Time: 12/25/19 11:54 AM  Result Value Ref Range   Glucose-Capillary 102 (H) 70 - 99 mg/dL    Comment: Glucose reference range applies only to samples taken after fasting for at least 8 hours.  Glucose, capillary     Status: Abnormal   Collection Time: 12/25/19  4:02 PM  Result Value Ref Range   Glucose-Capillary 146 (H) 70 - 99 mg/dL    Comment: Glucose reference range applies only to samples taken after fasting for at least 8 hours.  Magnesium     Status: None   Collection Time: 12/25/19  4:26 PM  Result Value Ref Range   Magnesium 2.4 1.7 - 2.4 mg/dL    Comment: Performed at [facility], [place] [address] [place], [place] [address]  Phosphorus     Status: None   Collection Time: 12/25/19  4:26 PM  Result Value Ref Range   Phosphorus 2.9 2.5 - 4.6 mg/dL    Comment: Performed at [facility] [address]  [place], [place] [address]  Glucose, capillary     Status: Abnormal   Collection Time: 12/25/19  7:45 PM  Result Value Ref Range   Glucose-Capillary 106 (H) 70 - 99 mg/dL    Comment: Glucose reference range applies only to samples taken after fasting for at least 8 hours.  Glucose, capillary     Status: Abnormal   Collection Time: 12/25/19 11:19 PM  Result Value Ref Range   Glucose-Capillary 123 (H) 70 - 99 mg/dL    Comment: Glucose reference range applies only to samples taken after fasting for at least 8 hours.  Glucose, capillary     Status: Abnormal   Collection Time: 12/26/19  3:27 AM  Result Value Ref Range   Glucose-Capillary 126 (H) 70 - 99 mg/dL    Comment: Glucose reference range applies only to samples taken after fasting for at least 8 hours.  Magnesium     Status: None   Collection Time: 12/26/19  5:36 AM  Result Value Ref Range   Magnesium 2.3 1.7 - 2.4 mg/dL    Comment: Performed at [facility] [address] [place], [place] [address]  Phosphorus     Status: None   Collection Time: 12/26/19  5:36 AM  Result Value Ref Range   Phosphorus 2.8 2.5 - 4.6 mg/dL    Comment: Performed at [facility] [address], [place], [place] [address]  Glucose, capillary     Status: Abnormal   Collection Time: 12/26/19  8:14 AM  Result Value Ref Range   Glucose-Capillary 118 (H) 70 - 99 mg/dL    Comment: Glucose reference range applies only to samples taken after fasting for at least 8 hours.    Recent Results (from the past 240 hour(s))  Respiratory Panel by RT PCR (Flu A&B, Covid) - Nasopharyngeal Swab     Status: None   Collection Time: 12/24/19  3:23 PM   Specimen: Nasopharyngeal Swab  Result Value Ref Range Status   SARS Coronavirus 2 by RT PCR NEGATIVE NEGATIVE Final    Comment: (NOTE) SARS-CoV-2 target nucleic acids are NOT DETECTED. The SARS-CoV-2 RNA is generally detectable in upper respiratoy specimens during the acute phase of infection. The  lowest concentration of SARS-CoV-2 viral copies this assay can detect is 131 copies/mL. A negative result does not preclude SARS-Cov-2 infection and should not be used as the sole basis for treatment or other patient management  decisions. A negative result may occur with  improper specimen collection/handling, submission of specimen other than nasopharyngeal swab, presence of viral mutation(s) within the areas targeted by this assay, and inadequate number of viral copies (<131 copies/mL). A negative result must be combined with clinical observations, patient history, and epidemiological information. The expected result is Negative. Fact Sheet for Patients:  PinkCheek.be Fact Sheet for Healthcare Providers:  GravelBags.it This test is not yet ap proved or cleared by the [place] FDA and  has been authorized for detection and/or diagnosis of SARS-CoV-2 by FDA under an Emergency Use Authorization (EUA). This EUA will remain  in effect (meaning this test can be used) for the duration of the COVID-19 declaration under Section 564(b)(1) of the Act, 21 U.S.C. section 360bbb-3(b)(1), unless the authorization is terminated or revoked sooner.    Influenza A by PCR NEGATIVE NEGATIVE Final   Influenza B by PCR NEGATIVE NEGATIVE Final    Comment: (NOTE) The Xpert Xpress SARS-CoV-2/FLU/RSV assay is intended as an aid in  the diagnosis of influenza from Nasopharyngeal swab specimens and  should not be used as a sole basis for treatment. Nasal washings and  aspirates are unacceptable for Xpert Xpress SARS-CoV-2/FLU/RSV  testing. Fact Sheet for Patients: PinkCheek.be Fact Sheet for Healthcare Providers: GravelBags.it This test is not yet approved or cleared by the [place] FDA and  has been authorized for detection and/or diagnosis of SARS-CoV-2 by  FDA under an Emergency  Use Authorization (EUA). This EUA will remain  in effect (meaning this test can be used) for the duration of the  Covid-19 declaration under Section 564(b)(1) of the Act, 21  U.S.C. section 360bbb-3(b)(1), unless the authorization is  terminated or revoked. Performed at [facility], [place] [address] [place], [place] [address]   MRSA PCR Screening     Status: None   Collection Time: 12/24/19  4:30 PM   Specimen: Nasopharyngeal  Result Value Ref Range Status   MRSA by PCR NEGATIVE NEGATIVE Final    Comment:        The GeneXpert MRSA Assay (FDA approved for NASAL specimens only), is one component of a comprehensive MRSA colonization surveillance program. It is not intended to diagnose MRSA infection nor to guide or monitor treatment for MRSA infections. Performed at [facility], [place] [address], [place], [place] [address]   Culture, blood (routine x 2)     Status: None (Preliminary result)   Collection Time: 12/24/19  6:06 PM   Specimen: BLOOD  Result Value Ref Range Status   Specimen Description BLOOD LEFT ANTECUBITAL  Final   Special Requests   Final    BOTTLES DRAWN AEROBIC AND ANAEROBIC Blood Culture results may not be optimal due to an inadequate volume of blood received in culture bottles   Culture   Final    NO GROWTH 2 DAYS Performed at [facility], [place] [address] [place], [place] [address]    Report Status PENDING  Incomplete  Culture, blood (routine x 2)     Status: None (Preliminary result)   Collection Time: 12/24/19  6:14 PM   Specimen: BLOOD LEFT FOREARM  Result Value Ref Range Status   Specimen Description BLOOD LEFT FOREARM  Final   Special Requests   Final    BOTTLES DRAWN AEROBIC AND ANAEROBIC Blood Culture adequate volume   Culture   Final    NO GROWTH 2 DAYS Performed at [facility], [place],  [place] [address]    Report Status PENDING  Incomplete  Culture, Urine     Status: None   Collection Time: 12/24/19   6:38 PM   Specimen: Urine, Random  Result Value Ref Range Status   Specimen Description URINE, RANDOM  Final   Special Requests NONE  Final   Culture   Final    NO GROWTH Performed at [facility], [place] [address], [place], [place] [address]    Report Status 12/25/2019 FINAL  Final    Lipid Panel Recent Labs    12/24/19 1046  TRIG 119    Studies/Results: EEG  Result Date: 12/24/2019 Lora Havens, MD     12/24/2019  1:11 PM Patient Name: DELFINO FRIESEN MRN: [id number] Epilepsy Attending: Lora Havens Referring Physician/Provider: Dr. Kerney Elbe Date: 12/24/2019 Duration: 25.68 mins Patient history: 24 year old male with history of epilepsy presented in status epilepticus.  EEG to evaluate for subclinical seizures. Level of alertness: Comatose/sedated AEDs during EEG study: Depakote, Versed, propofol Technical aspects: This EEG study was done with scalp electrodes positioned according to the 10-20 International system of electrode placement. Electrical activity was acquired at a sampling rate of '500Hz'  and reviewed with a high frequency filter of '70Hz'  and a low frequency filter of '1Hz' . EEG data were recorded continuously and digitally stored. Description: EEG showed continuous generalized 2 to 3 Hz delta activity with overriding 15 to 18 Hz, 2-3 uV beta activity distributed symmetrically and diffusely.  EEG was reactive to tactile stimulation. Hyperventilation and photic stimulation were not performed. Abnormality -Continuous slow, generalized -Excessive beta, generalized IMPRESSION: This study is suggestive of severe diffuse encephalopathy, nonspecific to etiology but most likely secondary to sedation. The excessive beta activity seen in the background is most likely due to the effect of benzodiazepine and is a benign EEG pattern. No seizures or epileptiform discharges were seen throughout the recording. Dr Cheral Marker was notified. Lora Havens   CT Head Wo Contrast  Result Date:  12/24/2019 CLINICAL DATA:  Head trauma, moderate/severe. Additional history provided: Patient brought in by ambulance from home with seizures, 2 witnessed tonic-clonic seizures with EMS, patient unresponsive, fall. EXAM: CT HEAD WITHOUT CONTRAST CT CERVICAL SPINE WITHOUT CONTRAST TECHNIQUE: Multidetector CT imaging of the head and cervical spine was performed following the standard protocol without intravenous contrast. Multiplanar CT image reconstructions of the cervical spine were also generated. COMPARISON:  CT head 01/15/2019, CT head/maxillofacial/cervical spine 04/23/2018 FINDINGS: CT HEAD FINDINGS Brain: There is no evidence of acute intracranial hemorrhage, intracranial mass, midline shift or extra-axial fluid collection.No demarcated cortical infarction. Vascular: No hyperdense vessel. Skull: Normal. Negative for fracture or focal lesion. Sinuses/Orbits: Visualized orbits demonstrate no acute abnormality. Left sphenoid sinus mucous retention cyst. No significant mastoid effusion. CT CERVICAL SPINE FINDINGS Alignment: Nonspecific reversal of the expected cervical lordosis. No significant spondylolisthesis. Skull base and vertebrae: The basion-dental and atlanto-dental intervals are maintained.No evidence of acute fracture to the cervical spine. Soft tissues and spinal canal: No prevertebral fluid or swelling. No visible canal hematoma. Partially visualized support tubes. Disc levels: No significant bony spinal canal or neural foraminal narrowing at any level. Upper chest: No consolidation within the imaged lung apices. No visible pneumothorax. IMPRESSION: CT head: 1. No evidence of acute intracranial abnormality. 2. Left sphenoid sinus mucous retention cyst. CT cervical spine: 1. No evidence of acute fracture to the cervical spine. 2. Nonspecific reversal of the expected cervical lordosis, which may be positional. Electronically Signed   By: Marylyn Ishihara  Golden DO   On: 12/24/2019 12:10   CT Cervical Spine Wo  Contrast  Result Date: 12/24/2019 CLINICAL DATA:  Head trauma, moderate/severe. Additional history provided: Patient brought in by ambulance from home with seizures, 2 witnessed tonic-clonic seizures with EMS, patient unresponsive, fall. EXAM: CT HEAD WITHOUT CONTRAST CT CERVICAL SPINE WITHOUT CONTRAST TECHNIQUE: Multidetector CT imaging of the head and cervical spine was performed following the standard protocol without intravenous contrast. Multiplanar CT image reconstructions of the cervical spine were also generated. COMPARISON:  CT head 01/15/2019, CT head/maxillofacial/cervical spine 04/23/2018 FINDINGS: CT HEAD FINDINGS Brain: There is no evidence of acute intracranial hemorrhage, intracranial mass, midline shift or extra-axial fluid collection.No demarcated cortical infarction. Vascular: No hyperdense vessel. Skull: Normal. Negative for fracture or focal lesion. Sinuses/Orbits: Visualized orbits demonstrate no acute abnormality. Left sphenoid sinus mucous retention cyst. No significant mastoid effusion. CT CERVICAL SPINE FINDINGS Alignment: Nonspecific reversal of the expected cervical lordosis. No significant spondylolisthesis. Skull base and vertebrae: The basion-dental and atlanto-dental intervals are maintained.No evidence of acute fracture to the cervical spine. Soft tissues and spinal canal: No prevertebral fluid or swelling. No visible canal hematoma. Partially visualized support tubes. Disc levels: No significant bony spinal canal or neural foraminal narrowing at any level. Upper chest: No consolidation within the imaged lung apices. No visible pneumothorax. IMPRESSION: CT head: 1. No evidence of acute intracranial abnormality. 2. Left sphenoid sinus mucous retention cyst. CT cervical spine: 1. No evidence of acute fracture to the cervical spine. 2. Nonspecific reversal of the expected cervical lordosis, which may be positional. Electronically Signed   By: Kellie Simmering DO   On: 12/24/2019 12:10    DG Chest Port 1 View  Result Date: 12/25/2019 CLINICAL DATA:  Hypoxia EXAM: PORTABLE CHEST 1 VIEW COMPARISON:  December 24, 2019 FINDINGS: Endotracheal tube tip is 6.4 cm above the carina. Nasogastric tube tip and side port are below the diaphragm. No pneumothorax. There is a small layering pleural effusion on the right. Lungs elsewhere are clear. Heart size and pulmonary vascularity are normal. No adenopathy no bone lesions. IMPRESSION: Tube positions as described without pneumothorax. Small pleural effusion on the right. Lungs elsewhere clear. Cardiac silhouette within normal limits. Electronically Signed   By: Lowella Grip III M.D.   On: 12/25/2019 08:09   DG Chest Portable 1 View  Result Date: 12/24/2019 CLINICAL DATA:  Hypoxia EXAM: PORTABLE CHEST 1 VIEW COMPARISON:  October 15, 2008 FINDINGS: Endotracheal tube tip is 4.8 cm above the carina. Nasogastric tube tip and side port are in the stomach. No pneumothorax. The lungs are clear. The heart size and pulmonary vascularity are normal. No adenopathy. No bone lesions. IMPRESSION: Tube positions as described without pneumothorax. Lungs clear. Cardiac silhouette within normal limits. Electronically Signed   By: Lowella Grip III M.D.   On: 12/24/2019 11:23   Overnight EEG with video  Result Date: 12/25/2019 Lora Havens, MD     12/26/2019  9:36 AM Patient Name: ROZELL THEILER MRN: [id number] Epilepsy Attending: Lora Havens Referring Physician/Provider: Dr. Kerney Elbe Duration: 12/24/2019 1310 to 12/25/2019 1310  Patient history: 24 year old male with history of epilepsy presented in status epilepticus. EEG to evaluate for subclinical seizures.  Level of alertness: Comatose/sedated  AEDs during EEG study: Depakote, Versed  Technical aspects: This EEG study was done with scalp electrodes positioned according to the 10-20 International system of electrode placement. Electrical activity was acquired at a sampling rate of '500Hz'  and reviewed  with a high frequency filter  of '70Hz'  and a low frequency filter of '1Hz' . EEG data were recorded continuously and digitally stored.  Description: EEG showed continuous generalized 2 to 3 Hz delta activity with overriding 15 to 18 Hz, 2-3 uV beta activity distributed symmetrically and diffusely. Sharp waves were noted in left frontotemporal region as well as sharp transients in right frontotemporal region. EEG was reactive to tactile stimulation. Hyperventilation and photic stimulation were not performed. Event button was pressed on 12/25/2019 at 0849 and 1235. Patient was noted to have axial stiffening. Concomitant EEG before, during and after the event didn't show any eeg change to suggest seizure.  Abnormality - Sharp waves, left frontotemporal region - Continuous slow, generalized - Excessive beta, generalized  IMPRESSION: This study is suggestive of epileptogenicity in left frontotemporal region. Additionally, there is evidence of severe diffuse encephalopathy, nonspecific to etiology but most likely secondary to sedation. No seizures were seen throughout the recording. Event button was pressed on 12/25/2019 as described above without eeg change and therefore not epileptic. Priyanka O Yadav   [place] EKG SITE RITE  Result Date: 12/25/2019 If Site Rite image not attached, placement could not be confirmed due to current cardiac rhythm.   Medications:  Scheduled: . chlorhexidine gluconate (MEDLINE KIT)  15 mL Mouth Rinse BID  . Chlorhexidine Gluconate Cloth  6 each Topical Daily  . enoxaparin (LOVENOX) injection  40 mg Subcutaneous Q24H  . Eslicarbazepine Acetate  1,200 mg Per Tube Daily  . feeding supplement (PRO-STAT SUGAR FREE 64)  30 mL Per Tube BID  . feeding supplement (VITAL HIGH PROTEIN)  1,000 mL Per Tube Q24H  . insulin aspart  0-6 Units Subcutaneous Q4H  . mouth rinse  15 mL Mouth Rinse 10 times per day  . valproic acid  1,000 mg Per Tube TID   Continuous: . dexmedetomidine (PRECEDEX) IV  infusion 0.6 mcg/kg/hr (12/26/19 0800)  . famotidine (PEPCID) IV Stopped (12/25/19 2135)  . lactated ringers with kcl 100 mL/hr at 12/26/19 0800  . midazolam Stopped (12/26/19 0530)  . propofol (DIPRIVAN) infusion Stopped (12/25/19 1300)    Assessment:24 year old male with known seizure disorder presenting with status epilepticus, now resolved on Versed sedation. Valproic acid level obtained in the ED was subtherapeutic.  1.Currently intubated and sedated on Precedex only. He will open eyes and respond to simple commands with significant delay. Bedside EEG preliminary read: Diffuse slowing c/w sedation.  2. LTM EEG report for this AM: This study issuggestive ofsevere diffuse encephalopathy, nonspecific to etiology but most likely secondary to sedation.No seizures or definite epileptiform discharges were seen throughout the recording. 3. Valproic acid level yesterday was 134. Repeat level ordered. Daily VPA level also ordered to track. Goal VPA level would be in the upper range of therapeutic (50-100 therapeutic range, with goal of about 80-100)  Recommendations: 1.Recommend extubation. If successfully extubated and interactive with no seizure recurrence, then discontinue LTM EEG.   2. Continue valproic acid solution per OGT 1000 mg TID. His level was low on admission, so will continue at the 3000 mg per day dosing instead of the 2500 mg per day dosing endorsed by his mother.  3. Continue eslicarbazepine (not on formulary, were brought in by patient's mother): One and a half 800 mg tablets per OGT qd.   35 minutes spent in the neurological evaluation and management of this critically ill patient.    LOS: 2 days   '@Electronically'  signed: Dr. Kerney Elbe 12/26/2019  9:57 AM

## 2019-12-27 ENCOUNTER — Inpatient Hospital Stay (HOSPITAL_COMMUNITY): Payer: Medicaid Other

## 2019-12-27 DIAGNOSIS — J189 Pneumonia, unspecified organism: Secondary | ICD-10-CM

## 2019-12-27 LAB — CBC
HCT: 40.6 % (ref 39.0–52.0)
Hemoglobin: 13.5 g/dL (ref 13.0–17.0)
MCH: 29.7 pg (ref 26.0–34.0)
MCHC: 33.3 g/dL (ref 30.0–36.0)
MCV: 89.4 fL (ref 80.0–100.0)
Platelets: 121 10*3/uL — ABNORMAL LOW (ref 150–400)
RBC: 4.54 MIL/uL (ref 4.22–5.81)
RDW: 14.1 % (ref 11.5–15.5)
WBC: 10.6 10*3/uL — ABNORMAL HIGH (ref 4.0–10.5)
nRBC: 0 % (ref 0.0–0.2)

## 2019-12-27 LAB — BASIC METABOLIC PANEL
Anion gap: 12 (ref 5–15)
BUN: 23 mg/dL — ABNORMAL HIGH (ref 6–20)
CO2: 22 mmol/L (ref 22–32)
Calcium: 8.9 mg/dL (ref 8.9–10.3)
Chloride: 113 mmol/L — ABNORMAL HIGH (ref 98–111)
Creatinine, Ser: 1.63 mg/dL — ABNORMAL HIGH (ref 0.61–1.24)
GFR calc Af Amer: >60 mL/min (ref >60)
GFR calc non Af Amer: 58 mL/min — ABNORMAL LOW (ref >60)
Glucose, Bld: 112 mg/dL — ABNORMAL HIGH (ref 70–99)
Potassium: 4.7 mmol/L (ref 3.5–5.1)
Sodium: 147 mmol/L — ABNORMAL HIGH (ref 135–145)

## 2019-12-27 LAB — GLUCOSE, CAPILLARY
Glucose-Capillary: 101 mg/dL — ABNORMAL HIGH (ref 70–99)
Glucose-Capillary: 115 mg/dL — ABNORMAL HIGH (ref 70–99)
Glucose-Capillary: 120 mg/dL — ABNORMAL HIGH (ref 70–99)
Glucose-Capillary: 122 mg/dL — ABNORMAL HIGH (ref 70–99)
Glucose-Capillary: 84 mg/dL (ref 70–99)
Glucose-Capillary: 88 mg/dL (ref 70–99)
Glucose-Capillary: 93 mg/dL (ref 70–99)

## 2019-12-27 LAB — AMMONIA: Ammonia: 29 umol/L (ref 9–35)

## 2019-12-27 LAB — VALPROIC ACID LEVEL: Valproic Acid Lvl: 131 ug/mL — ABNORMAL HIGH (ref 50.0–100.0)

## 2019-12-27 LAB — TRIGLYCERIDES: Triglycerides: 205 mg/dL — ABNORMAL HIGH (ref <150)

## 2019-12-27 MED ORDER — VITAL AF 1.2 CAL PO LIQD
1000.0000 mL | ORAL | Status: DC
  Administered 2019-12-27: 1000 mL

## 2019-12-27 MED ORDER — VALPROIC ACID 250 MG/5ML PO SOLN
500.0000 mg | Freq: Every day | ORAL | Status: DC
  Administered 2019-12-27: 500 mg via ORAL
  Filled 2019-12-27: qty 10

## 2019-12-27 MED ORDER — CLONIDINE ORAL SUSPENSION 10 MCG/ML
0.1000 mg | Freq: Two times a day (BID) | ORAL | Status: DC
  Filled 2019-12-27: qty 10

## 2019-12-27 MED ORDER — VALPROIC ACID 250 MG/5ML PO SOLN
1000.0000 mg | Freq: Two times a day (BID) | ORAL | Status: DC
  Administered 2019-12-27 – 2019-12-28 (×2): 1000 mg
  Filled 2019-12-27 (×2): qty 20

## 2019-12-27 MED ORDER — CLONIDINE HCL 0.1 MG PO TABS
0.1000 mg | ORAL_TABLET | Freq: Two times a day (BID) | ORAL | Status: DC
  Administered 2019-12-27 – 2019-12-28 (×3): 0.1 mg
  Filled 2019-12-27 (×3): qty 1

## 2019-12-27 MED ORDER — DEXTROSE 5 % IV SOLN: INTRAVENOUS | Status: DC

## 2019-12-27 MED ORDER — FAMOTIDINE 40 MG/5ML PO SUSR
20.0000 mg | Freq: Two times a day (BID) | ORAL | Status: DC
  Administered 2019-12-27 – 2019-12-28 (×2): 20 mg
  Filled 2019-12-27 (×2): qty 2.5

## 2019-12-27 MED ORDER — PIPERACILLIN-TAZOBACTAM 3.375 G IVPB 30 MIN
3.3750 g | Freq: Once | INTRAVENOUS | Status: AC
  Administered 2019-12-27: 3.375 g via INTRAVENOUS
  Filled 2019-12-27 (×2): qty 50

## 2019-12-27 MED ORDER — LORAZEPAM 2 MG/ML IJ SOLN: 2.0000 mg | INTRAMUSCULAR | Status: DC | PRN

## 2019-12-27 MED ORDER — PIPERACILLIN-TAZOBACTAM 3.375 G IVPB
3.3750 g | Freq: Three times a day (TID) | INTRAVENOUS | Status: DC
  Administered 2019-12-27 – 2019-12-29 (×5): 3.375 g via INTRAVENOUS
  Filled 2019-12-27 (×4): qty 50

## 2019-12-27 NOTE — Progress Notes (Signed)
PCCM progress note  Assessing for extubation Through the afternoon patient has been agitated when sedation weaned off Has low tidal volumes on 10/5 pressure support  Now back on low-dose propofol and full vent support.  Hold off extubation for tonight and reassess tomorrow morning.  Chilton Greathouse MD [facility] Please see [email or web address] for pager details.  12/27/2019, 5:49 PM

## 2019-12-27 NOTE — Progress Notes (Signed)
Subjective: No acute events overnight.  No further seizures overnight.  ROS: Unable to obtain due to poor mental status  Examination  Vital signs in last 24 hours: Temp:  [97.5 F (36.4 C)-102.4 F (39.1 C)] 97.5 F (36.4 C) (04/05 1200) Pulse Rate:  [49-93] 57 (04/05 0900) Resp:  [18] 18 (04/05 0900) BP: (128-151)/(66-101) 151/101 (04/05 0900) SpO2:  [94 %-100 %] 100 % (04/05 1400) FiO2 (%):  [30 %-40 %] 30 % (04/05 1400)  General: lying in bed, not in apparent distress CVS: pulse-normal rate and rhythm RS: breathing comfortably, intubated Extremities: normal, warm  Neuro: MS: Opens eyes spontaneously, moves arms and legs after asking repeatedly but not consistently following commands CN: pupils equal and reactive, tracks examiner in room, blinks to threat bilaterally, no apparent facial asymmetry  Motor: Antigravity strength in all 4 extremities  Basic Metabolic Panel: Recent Labs  Lab 12/24/19 1046 12/24/19 1207 12/24/19 1713 12/24/19 1713 12/25/19 0400 12/25/19 0549 12/25/19 0954 12/25/19 1626 12/26/19 0536 12/26/19 1005 12/26/19 1626 12/27/19 0218  NA 138   < > 140  --  139 139  --   --   --  146*  --  147*  K 4.6   < > 4.5  --  4.5 4.6  --   --   --  4.7  --  4.7  CL 98  --  104  --   --  104  --   --   --  111  --  113*  CO2 10*  --  25  --   --  19*  --   --   --  20*  --  22  GLUCOSE 216*  --  92  --   --  140*  --   --   --  136*  --  112*  BUN 16  --  18  --   --  20  --   --   --  26*  --  23*  CREATININE 1.59*  --  2.05*  --   --  2.37*  --   --   --  1.77*  --  1.63*  CALCIUM 9.9   < > 9.1   < >  --  9.0  --   --   --  8.8*  --  8.9  MG 2.8*  --   --   --   --   --  2.4 2.4 2.3  --  2.1  --   PHOS 6.8*  --   --   --   --   --  2.7 2.9 2.8  --  3.0  --    < > = values in this interval not displayed.    CBC: Recent Labs  Lab 12/24/19 1046 12/24/19 1046 12/24/19 1207 12/25/19 0400 12/25/19 0954 12/26/19 1005 12/27/19 0218  WBC 13.5*  --   --    --  10.3 9.6 10.6*  NEUTROABS 4.9  --   --   --   --   --   --   HGB 17.2*   < > 16.3 17.0 15.1 14.1 13.5  HCT 57.7*   < > 48.0 50.0 46.0 43.4 40.6  MCV 96.8  --   --   --  87.6 88.8 89.4  PLT 271  --   --   --  182 160 121*   < > = values in this interval not displayed.     Coagulation Studies: No results  for input(s): LABPROT, INR in the last 72 hours.  Imaging  CT head without contrast 12/24/2019: No acute abnormality.   ASSESSMENT AND PLAN: 24 year old male with history of epilepsy presented with status epilepticus.   Status epilepticus (resolved) Epilepsy with breakthrough seizures AKI Acute respiratory failure Possible aspiration pneumonia Leukocytosis Thrombocytopenia Hypernatremia Hyperglycemia -LTM EEG did not show any further seizures. -Etiology for breakthrough status epilepticus: Had subtherapeutic levels of valproic acid (39) therefore questionable noncompliance.   Recommendations -Valproic acid has been consistently elevated.  If seizures are controlled and patient is awake with gradual improvement in mental status, I would not necessarily reduce valproic acid.  However, patient continues to be encephalopathic and also had thrombocytopenia noted today which could be secondary to valproic toxicity. -Therefore will check ammonia level and reduce valproic acid dosing to 1000-500-1000mg  ( was on 1000mg  TID)  -Continue eslicarbazepine 1200 mg daily and Klonopin 1 mg 3 times daily -Discontinue LTM EEG as patient has not had any further seizures -Management of rest of the comorbidities per primary team -As needed IV Ativan 2 mg for generalized tonic-clonic seizure lasting more than 2 minutes or focal seizure lasting more than 5 minutes  CRITICAL CARE Performed by:   Total critical care time: 35 minutes  Critical care time was exclusive of separately billable procedures and treating other patients.  Critical care was necessary to treat or  prevent imminent or life-threatening deterioration.  Critical care was time spent personally by me on the following activities: development of treatment plan with patient and/or surrogate as well as nursing, discussions with consultants, evaluation of patient's response to treatment, examination of patient, obtaining history from patient or surrogate, ordering and performing treatments and interventions, ordering and review of laboratory studies, ordering and review of radiographic studies, pulse oximetry and re-evaluation of patient's condition.

## 2019-12-27 NOTE — Progress Notes (Signed)
Patient almost self extubated while RT was suctioning. Tube was out to 17. RT advanced tube back to 25. Bilateral breath sounds. Will get chest x-ray to confirm placement.

## 2019-12-27 NOTE — Progress Notes (Signed)
eLink Physician-Brief Progress Note Patient Name: Dylan Moon DOB: 01-Apr-1996 MRN: [id number]   Date of Service  12/27/2019  HPI/Events of Note  Review of CXR reveals ETT about 6 cm above the carina.   eICU Interventions  Will order: 1. Advance ETT 2 cm and re-secure.     Intervention Category Major Interventions: Other:  Lenell Antu 12/27/2019, 8:39 PM

## 2019-12-27 NOTE — Procedures (Signed)
Patient Name:Dylan Moon XYO:[id number] Epilepsy Attending:Brixton Schnapp Annabelle Harman Referring Physician/Provider:Dr. Wardell Heath Duration:12/26/2019 1310 to 4/5/20210858 Total duration of study: 67 hours  Patient history:24 year old male with history of epilepsy presented in status epilepticus.EEG to evaluate for subclinical seizures.  Level of alertness:Comatose/sedated  AEDs during EEG study:Depakote, Aptiom, Versed, propofol  Technical aspects: This EEG study was done with scalp electrodes positioned according to the 10-20 International system of electrode placement. Electrical activity was acquired at a sampling rate of 500Hz  and reviewed with a high frequency filter of 70Hz  and a low frequency filter of 1Hz . EEG data were recorded continuously and digitally stored.  Description:EEG showed continuous generalized 2 to 3 Hz delta activity with overriding 15 to 18 Hz, 2-3 uV beta activity distributed symmetrically and diffusely. EEG was reactive to tactile stimulation.Hyperventilation and photic stimulation were not performed.  After around midnight on 12/28/2019, EEG was technically difficult interpret due to significant electrode artifact.  Abnormality - Continuous slow, generalized - Excessive beta, generalized  IMPRESSION: This study issuggestive ofsevere diffuse encephalopathy, nonspecific to etiology but most likely secondary to sedation.No seizures or definite epileptiform discharges were seen throughout the recording.  [name] 

## 2019-12-27 NOTE — Progress Notes (Signed)
LTM discontinued. No skin breakdown was seen. Called [facility] to D/C monitoring.

## 2019-12-27 NOTE — Progress Notes (Addendum)
NAME:  Dylan Moon, MRN:  [id number], DOB:  Aug 11, 1996, LOS: 3 ADMISSION DATE:  12/24/2019,  REFERRING MD:  EDP, CHIEF COMPLAINT:  Status epilepticus.  Brief History   Dylan Moon is a 24 y.o male with ADHD and epilepsy who presented to the emergency department with status epilepticus. He was subsequently intubated and admitted to the ICU for burst suppression. Mother states that he is compliant with all of his AEDs =  Depakote ER 4481 mg qhs and eslicarbazepine 856 mg tabs 1.5 tabs per day. The mother states that he typically has a seizure at least monthly. Possible etoh ingestion prior to admission.  Consults:  Neurology  Procedures:  Endotracheal intubation 4/2  Significant Diagnostic Tests:  CT head 4/2:  1. No evidence of acute intracranial abnormality. 2. Left sphenoid sinus mucous retention cyst.  CT cervical spine 4/2:  1. No evidence of acute fracture to the cervical spine. 2. Nonspecific reversal of the expected cervical lordosis, which may be positional.  EEG 4/3 Abnormality - Sharp waves, left frontotemporal region - Continuous slow, generalized - Excessive beta, generalized  IMPRESSION: This study issuggestive of epileptogenicity in left frontotemporal region. Additionally, there is evidence of severe diffuse encephalopathy, nonspecific to etiology but most likely secondary to sedation.No seizures were seen throughout the recording.  Micro Data:  MRSA  PCR  4/2  Neg  Urine culture 4/1 Neg  Resp viral panel 4/2  Neg Blood cultures 4/2  >>> Resp culture 4/5 >>>  Antimicrobials:  Zosyn  (? HCAP)  4/5 >>>   Scheduled Meds: . chlorhexidine gluconate (MEDLINE KIT)  15 mL Mouth Rinse BID  . Chlorhexidine Gluconate Cloth  6 each Topical Daily  . clonazepam  1 mg Per Tube TID  . enoxaparin (LOVENOX) injection  40 mg Subcutaneous Q24H  . Eslicarbazepine Acetate  1,200 mg Per Tube Daily  . feeding supplement (PRO-STAT SUGAR FREE 64)  30 mL Per Tube  BID  . feeding supplement (VITAL HIGH PROTEIN)  1,000 mL Per Tube Q24H  . insulin aspart  0-6 Units Subcutaneous Q4H  . mouth rinse  15 mL Mouth Rinse 10 times per day  . QUEtiapine  25 mg Per Tube BID  . rocuronium  100 mg Intravenous Once  . valproic acid  1,000 mg Per Tube TID   Continuous Infusions: . dexmedetomidine (PRECEDEX) IV infusion 1.2 mcg/kg/hr (12/27/19 0900)  . famotidine (PEPCID) IV Stopped (12/26/19 2228)  . lactated ringers with kcl 100 mL/hr at 12/27/19 0900  . midazolam 5 mg/hr (12/27/19 0900)  . propofol (DIPRIVAN) infusion 25 mcg/kg/min (12/27/19 0923)   PRN Meds:.acetaminophen (TYLENOL) oral liquid 160 mg/5 mL, acetaminophen, haloperidol lactate, midazolam, ondansetron (ZOFRAN) IV   Interim history/subjective:  Got very agitated with d/c sedation pm 4/4 and spiked to 102.4 s obvious source   Objective   Blood pressure (!) 151/101, pulse (!) 57, temperature 98.4 F (36.9 C), temperature source Axillary, resp. rate 18, height 6' (1.829 m), weight 81.2 kg, SpO2 100 %.    Vent Mode: PRVC FiO2 (%):  [30 %-40 %] 30 % Set Rate:  [18 bmp] 18 bmp Vt Set:  [620 mL] 620 mL PEEP:  [5 cmH20] 5 cmH20 Pressure Support:  [10 cmH20-15 cmH20] 10 cmH20 Plateau Pressure:  [20 cmH20-22 cmH20] 20 cmH20   Intake/Output Summary (Last 24 hours) at 12/27/2019 1003 Last data filed at 12/27/2019 0900 Gross per 24 hour  Intake 3239.02 ml  Output 2400 ml  Net 839.02 ml   Danley Danker  Weights   12/24/19 1056 12/25/19 0500 12/26/19 0500  Weight: 81.6 kg 81.8 kg 81.2 kg    Examination: Tmax  102.4  At 4pm yestday, none since   Pt sedated on vent on precedex/ versed and clonazepam    No jvd Oropharynx et  Neck supple Lungs clear  bilaterally RRR no s3 or or sign murmur Abd soft with nl excursion  Extr warm with no edema or clubbing noted/ PAS in place  Neuro sedated, no resp to verbal.    pCXR 12/27/19  ? Air bronchograms behind heart/ bilateral atx changes    Resolved Hospital  Problem list     Assessment & Plan:     Seizure disorder with concern for breakthrough seizures - AED per neurology for burst suppression - on VPA, eslicarbazepine (home med) >>> now on clonazepam/ propofol/ versed so plan to stop versed first then wean propofol     Non-oliguric AKI Lab Results  Component Value Date   CREATININE 1.63 (H) 12/27/2019   CREATININE 1.77 (H) 12/26/2019   CREATININE 2.37 (H) 12/25/2019    - suspect secondary to urinary retention> good uop , creatinine improving     Acute hypoxemic respiratory failure - secondary to clinical coma from breakthrough seizures - mechanics are good / gas exchange good so once cns issues resolved should do fine with extubation.  Possible aspiration Pna  - Rx per flowsheet   Best practice:  Diet: tube feeds Pain/Anxiety/Delirium protocol (if indicated): see above VAP protocol (if indicated): ordered DVT prophylaxis: lovenox GI prophylaxis: pepcid Glucose control: controlled<180 Foley: yes for retention Mobility: bedrest Code Status: full Family Communication: pending  Disposition: needs ICU  Labs   CBC: Recent Labs  Lab 12/24/19 1046 12/24/19 1046 12/24/19 1207 12/25/19 0400 12/25/19 0954 12/26/19 1005 12/27/19 0218  WBC 13.5*  --   --   --  10.3 9.6 10.6*  NEUTROABS 4.9  --   --   --   --   --   --   HGB 17.2*   < > 16.3 17.0 15.1 14.1 13.5  HCT 57.7*   < > 48.0 50.0 46.0 43.4 40.6  MCV 96.8  --   --   --  87.6 88.8 89.4  PLT 271  --   --   --  182 160 121*   < > = values in this interval not displayed.   Basic Metabolic Panel: Recent Labs  Lab 12/24/19 1046 12/24/19 1207 12/24/19 1713 12/25/19 0400 12/25/19 0549 12/25/19 0954 12/25/19 1626 12/26/19 0536 12/26/19 1005 12/26/19 1626 12/27/19 0218  NA 138   < > 140 139 139  --   --   --  146*  --  147*  K 4.6   < > 4.5 4.5 4.6  --   --   --  4.7  --  4.7  CL 98  --  104  --  104  --   --   --  111  --  113*  CO2 10*  --  25  --  19*  --    --   --  20*  --  22  GLUCOSE 216*  --  92  --  140*  --   --   --  136*  --  112*  BUN 16  --  18  --  20  --   --   --  26*  --  23*  CREATININE 1.59*  --  2.05*  --  2.37*  --   --   --  1.77*  --  1.63*  CALCIUM 9.9  --  9.1  --  9.0  --   --   --  8.8*  --  8.9  MG 2.8*  --   --   --   --  2.4 2.4 2.3  --  2.1  --   PHOS 6.8*  --   --   --   --  2.7 2.9 2.8  --  3.0  --    < > = values in this interval not displayed.   GFR: Estimated Creatinine Clearance: 76.7 mL/min (A) (by C-G formula based on SCr of 1.63 mg/dL (H)). Recent Labs  Lab 12/24/19 1046 12/24/19 1322 12/24/19 1713 12/24/19 1817 12/25/19 0954 12/26/19 1005 12/27/19 0218  WBC 13.5*  --   --   --  10.3 9.6 10.6*  LATICACIDVEN >11.0* 6.0* 2.0* 2.8*  --   --   --     Liver Function Tests: Recent Labs  Lab 12/24/19 1046  AST 34  ALT 19  ALKPHOS 51  BILITOT 0.1*  PROT 7.8  ALBUMIN 5.1*   Recent Labs  Lab 12/24/19 1046  LIPASE 26   No results for input(s): AMMONIA in the last 168 hours.  ABG    Component Value Date/Time   PHART 7.436 12/25/2019 0400   PCO2ART 32.4 12/25/2019 0400   PO2ART 107.0 12/25/2019 0400   HCO3 21.6 12/25/2019 0400   TCO2 23 12/25/2019 0400   ACIDBASEDEF 1.0 12/25/2019 0400   O2SAT 98.0 12/25/2019 0400     Coagulation Profile: Recent Labs  Lab 12/24/19 1046  INR 1.1    Cardiac Enzymes: No results for input(s): CKTOTAL, CKMB, CKMBINDEX, TROPONINI in the last 168 hours.  HbA1C: Hgb A1c MFr Bld  Date/Time Value Ref Range Status  12/24/2019 05:13 PM 5.8 (H) 4.8 - 5.6 % Final    Comment:    (NOTE) Pre diabetes:          5.7%-6.4% Diabetes:              >6.4% Glycemic control for   <7.0% adults with diabetes     CBG: Recent Labs  Lab 12/26/19 1557 12/26/19 2015 12/27/19 0007 12/27/19 0625 12/27/19 0749  GLUCAP 109* 111* 122* 101* 84    Critical care time:      The patient is critically ill with multiple organ systems failure and requires high  complexity decision making for assessment and support, frequent evaluation and titration of therapies, application of advanced monitoring technologies and extensive interpretation of multiple databases. Critical Care Time devoted to patient care services described in this note is 45 minutes.    Christinia Gully, MD Pulmonary and [place] [phone] After 6:00 PM or weekends, use Beeper [phone]  After 7:00 pm call Elink  [phone]

## 2019-12-27 NOTE — Progress Notes (Signed)
Pharmacy Antibiotic Note  Dylan Moon is a 24 y.o. male admitted on 12/24/2019 with tonic clonic seizures.  Pharmacy has been consulted for Zosyn dosing for aspiration pneumonia. WBC 10.6. Tmax 102.4.   Plan: Start zosyn 3.375g IV (30 minute infusion) followed by 3.375g IV q8h (4 hour infusion) Monitor renal function, cultures/sensitivities, and clinical progression   Height: 6' (182.9 cm) Weight: 81.2 kg (179 lb 0.2 oz) IBW/kg (Calculated) : 77.6  Temp (24hrs), Avg:99.1 F (37.3 C), Min:97.7 F (36.5 C), Max:102.4 F (39.1 C)  Recent Labs  Lab 12/24/19 1046 12/24/19 1322 12/24/19 1713 12/24/19 1817 12/25/19 0549 12/25/19 0954 12/26/19 1005 12/27/19 0218  WBC 13.5*  --   --   --   --  10.3 9.6 10.6*  CREATININE 1.59*  --  2.05*  --  2.37*  --  1.77* 1.63*  LATICACIDVEN >11.0* 6.0* 2.0* 2.8*  --   --   --   --     Estimated Creatinine Clearance: 76.7 mL/min (A) (by C-G formula based on SCr of 1.63 mg/dL (H)).    Allergies  Allergen Reactions  . Keppra [Levetiracetam] Other (See Comments)    Irritability   . Tramadol Other (See Comments)    Contraindicated with current medications (??)  . Vimpat [Lacosamide] Other (See Comments)    Causes anger    Antimicrobials this admission: Zosyn 4/5 >>   Dose adjustments this admission: N/A  Microbiology results: 4/2 Bcx: ngtd  4/2 Ucx: negative 4/4 resp cx:   Thank you for allowing pharmacy to be a part of this patient's care.  Gerrit Halls, PharmD PGY1 Pharmacy Resident [employer]: [phone]   12/27/2019 12:19 PM

## 2019-12-27 NOTE — Progress Notes (Signed)
Initial Nutrition Assessment  DOCUMENTATION CODES:   Not applicable  INTERVENTION:   D/C Vital High Protein  Vital 1.2 @ 75 ml/hr (1800 ml/day) via OG tube  Provides: 2160 kcal, 135 grams protein, and 1459 ml free water.    NUTRITION DIAGNOSIS:   Inadequate oral intake related to inability to eat as evidenced by NPO status.  GOAL:   Patient will meet greater than or equal to 90% of their needs  MONITOR:   TF tolerance, Vent status  REASON FOR ASSESSMENT:   Consult, Ventilator Enteral/tube feeding initiation and management  ASSESSMENT:   Pt with PMH of ADHD and epilepsy who was admitted with status epilepticus.   Pt intubated for clinical coma for breakthrough seizures.  Versed and propofol have been stopped Spoke with RN and mom at bedside.  Per mom pt with no recent weight changes and good appetite.   Patient is currently intubated on ventilator support MV: 8.8 L/min Temp (24hrs), Avg:98.8 F (37.1 C), Min:97.5 F (36.4 C), Max:102.4 F (39.1 C)   Medications reviewed and include: novolog SSI Precedex D5 @ 100 ml/hr Labs reviewed: Na 147 (H)   TF: Vital High Protein @ 40 ml/hr with 30 ml Prostat BID Provides: 1160 kcal and 114 grams protein  NUTRITION - FOCUSED PHYSICAL EXAM:    Most Recent Value  Orbital Region  No depletion  Upper Arm Region  No depletion  Thoracic and Lumbar Region  No depletion  Buccal Region  No depletion  Temple Region  No depletion  Clavicle Bone Region  No depletion  Clavicle and Acromion Bone Region  No depletion  Scapular Bone Region  No depletion  Dorsal Hand  No depletion  Patellar Region  No depletion  Anterior Thigh Region  No depletion  Posterior Calf Region  No depletion  Edema (RD Assessment)  None  Hair  Reviewed  Eyes  Reviewed  Mouth  Unable to assess  Skin  Reviewed  Nails  Reviewed       Diet Order:   Diet Order            Diet NPO time specified  Diet effective now               EDUCATION NEEDS:   No education needs have been identified at this time  Skin:  Skin Assessment: Reviewed RN Assessment  Last BM:  unknown  Height:   Ht Readings from Last 1 Encounters:  12/24/19 6' (1.829 m)    Weight:   Wt Readings from Last 1 Encounters:  12/26/19 81.2 kg    Ideal Body Weight:  80.9 kg  BMI:  Body mass index is 24.28 kg/m.  Estimated Nutritional Needs:   Kcal:  2192  Protein:  100-120 grams  Fluid:  2 L/day  Cammy Copa., RD, LDN, CNSC See AMiON for contact information

## 2019-12-28 LAB — CBC
HCT: 39.1 % (ref 39.0–52.0)
Hemoglobin: 12.8 g/dL — ABNORMAL LOW (ref 13.0–17.0)
MCH: 28.7 pg (ref 26.0–34.0)
MCHC: 32.7 g/dL (ref 30.0–36.0)
MCV: 87.7 fL (ref 80.0–100.0)
Platelets: 150 10*3/uL (ref 150–400)
RBC: 4.46 MIL/uL (ref 4.22–5.81)
RDW: 13.8 % (ref 11.5–15.5)
WBC: 8.9 10*3/uL (ref 4.0–10.5)
nRBC: 0 % (ref 0.0–0.2)

## 2019-12-28 LAB — GLUCOSE, CAPILLARY
Glucose-Capillary: 86 mg/dL (ref 70–99)
Glucose-Capillary: 90 mg/dL (ref 70–99)
Glucose-Capillary: 97 mg/dL (ref 70–99)

## 2019-12-28 LAB — BASIC METABOLIC PANEL
Anion gap: 11 (ref 5–15)
BUN: 18 mg/dL (ref 6–20)
CO2: 24 mmol/L (ref 22–32)
Calcium: 8.4 mg/dL — ABNORMAL LOW (ref 8.9–10.3)
Chloride: 110 mmol/L (ref 98–111)
Creatinine, Ser: 1.5 mg/dL — ABNORMAL HIGH (ref 0.61–1.24)
GFR calc Af Amer: >60 mL/min (ref >60)
GFR calc non Af Amer: >60 mL/min (ref >60)
Glucose, Bld: 104 mg/dL — ABNORMAL HIGH (ref 70–99)
Potassium: 3.5 mmol/L (ref 3.5–5.1)
Sodium: 145 mmol/L (ref 135–145)

## 2019-12-28 LAB — VALPROIC ACID LEVEL: Valproic Acid Lvl: 94 ug/mL (ref 50.0–100.0)

## 2019-12-28 MED ORDER — HALOPERIDOL LACTATE 5 MG/ML IJ SOLN
5.0000 mg | Freq: Once | INTRAMUSCULAR | Status: AC
  Administered 2019-12-28: 5 mg via INTRAVENOUS
  Filled 2019-12-28: qty 1

## 2019-12-28 MED ORDER — FENTANYL CITRATE (PF) 100 MCG/2ML IJ SOLN
INTRAMUSCULAR | Status: AC
  Filled 2019-12-28: qty 2

## 2019-12-28 MED ORDER — HALOPERIDOL LACTATE 5 MG/ML IJ SOLN
5.0000 mg | INTRAMUSCULAR | Status: DC | PRN
  Administered 2019-12-28 – 2019-12-30 (×7): 5 mg via INTRAVENOUS
  Filled 2019-12-28 (×8): qty 1

## 2019-12-28 MED ORDER — FENTANYL CITRATE (PF) 100 MCG/2ML IJ SOLN
INTRAMUSCULAR | Status: AC
  Administered 2019-12-28: 100 ug
  Filled 2019-12-28: qty 2

## 2019-12-28 MED ORDER — FENTANYL CITRATE (PF) 100 MCG/2ML IJ SOLN
50.0000 ug | INTRAMUSCULAR | Status: DC | PRN
  Administered 2019-12-28 – 2019-12-29 (×6): 50 ug via INTRAVENOUS
  Filled 2019-12-28 (×4): qty 2

## 2019-12-28 MED ORDER — CLONIDINE HCL 0.2 MG PO TABS: 0.2000 mg | ORAL_TABLET | Freq: Three times a day (TID) | ORAL | Status: DC

## 2019-12-28 NOTE — Procedures (Signed)
Extubation Procedure Note  Patient Details:   Name: Dylan Moon DOB: 07/14/1996 MRN: [id number]   Airway Documentation:    Vent end date: 12/28/19 Vent end time: 1220   Evaluation  O2 sats: stable throughout Complications: No apparent complications Patient did tolerate procedure well. Bilateral Breath Sounds: Clear   Patient extubated per MD order & placed on 4L [place]. Patient in no distress, but will not cough or speak at our request.  Jacqulynn Cadet 12/28/2019, 12:26 PM

## 2019-12-28 NOTE — Progress Notes (Signed)
NAME:  Dylan Moon, MRN:  [id number], DOB:  1995-11-08, LOS: 4 ADMISSION DATE:  12/24/2019,  REFERRING MD:  EDP, CHIEF COMPLAINT:  Status epilepticus.  Brief History   Dylan Moon is a 24 y.o male with ADHD and epilepsy who presented to the emergency department with status epilepticus. He was subsequently intubated and admitted to the ICU for burst suppression. Mother states that he is compliant with all of his AEDs =  Depakote ER 8850 mg qhs and eslicarbazepine 277 mg tabs 1.5 tabs per day. The mother states that he typically has a seizure at least monthly. Possible etoh ingestion prior to admission.  Consults:  Neurology  Procedures:  Endotracheal intubation 4/2  Significant Diagnostic Tests:  CT head 4/2:  1. No evidence of acute intracranial abnormality. 2. Left sphenoid sinus mucous retention cyst.  CT cervical spine 4/2:  1. No evidence of acute fracture to the cervical spine. 2. Nonspecific reversal of the expected cervical lordosis, which may be positional.  EEG 4/3 Abnormality - Sharp waves, left frontotemporal region - Continuous slow, generalized - Excessive beta, generalized  IMPRESSION: This study issuggestive of epileptogenicity in left frontotemporal region. Additionally, there is evidence of severe diffuse encephalopathy, nonspecific to etiology but most likely secondary to sedation.No seizures were seen throughout the recording.  Micro Data:  MRSA  PCR  4/2  Neg  Urine culture 4/1 Neg  Resp viral panel 4/2  Neg Blood cultures 4/2  >>> Resp culture 4/5 >>> staph >>>   Antimicrobials:  Zosyn  (? HCAP)  4/5 >>>   Scheduled Meds: . chlorhexidine gluconate (MEDLINE KIT)  15 mL Mouth Rinse BID  . Chlorhexidine Gluconate Cloth  6 each Topical Daily  . clonazepam  1 mg Per Tube TID  . cloNIDine  0.1 mg Per Tube BID  . enoxaparin (LOVENOX) injection  40 mg Subcutaneous Q24H  . Eslicarbazepine Acetate  1,200 mg Per Tube Daily  . famotidine  20  mg Per Tube BID  . insulin aspart  0-6 Units Subcutaneous Q4H  . mouth rinse  15 mL Mouth Rinse 10 times per day  . QUEtiapine  25 mg Per Tube BID  . valproic acid  1,000 mg Per Tube BID  . valproic acid  500 mg Oral Q1200   Continuous Infusions: . dexmedetomidine (PRECEDEX) IV infusion 0.9 mcg/kg/hr (12/28/19 0900)  . dextrose 100 mL/hr at 12/28/19 0500  . feeding supplement (VITAL AF 1.2 CAL) 1,000 mL (12/27/19 1629)  . midazolam 1 mg/hr (12/28/19 0900)  . piperacillin-tazobactam (ZOSYN)  IV 12.5 mL/hr at 12/28/19 0900  . propofol (DIPRIVAN) infusion 10 mcg/kg/min (12/28/19 0900)   PRN Meds:.acetaminophen (TYLENOL) oral liquid 160 mg/5 mL, acetaminophen, haloperidol lactate, LORazepam, midazolam, ondansetron (ZOFRAN) IV   Interim history/subjective:  Still very agitated when not on precedex and diprovan drips   Objective   Blood pressure (!) 144/81, pulse 78, temperature (!) 100.7 F (38.2 C), temperature source Axillary, resp. rate 18, height 6' (1.829 m), weight 81.5 kg, SpO2 100 %.    Vent Mode: PRVC FiO2 (%):  [30 %] 30 % Set Rate:  [18 bmp] 18 bmp Vt Set:  [620 mL] 620 mL PEEP:  [5 cmH20] 5 cmH20 Pressure Support:  [10 cmH20] 10 cmH20 Plateau Pressure:  [18 cmH20-24 cmH20] 18 cmH20   Intake/Output Summary (Last 24 hours) at 12/28/2019 0955 Last data filed at 12/28/2019 0900 Gross per 24 hour  Intake 4018.37 ml  Output 990 ml  Net 3028.37 ml  Filed Weights   12/25/19 0500 12/26/19 0500 12/28/19 0422  Weight: 81.8 kg 81.2 kg 81.5 kg    Examination: Tmax  100.7  Pt agitated, uncooperative,not f/c on diprovan/ precedex drips  No jvd Oropharynx oral et/og Neck supple Lungs with a minimal  exp > insp rhonchi bilaterally and thick slt purulent secretions RRR no s3 or or sign murmur Abd obese with nl excursion  Extr warm with no edema or clubbing noted   pCXR 4/5 pm : 1. Endotracheal tube above the carina. 2. Right lung base airspace opacity concerning for  pneumonia.        Resolved Hospital Problem list     Assessment & Plan:     Seizure disorder with concern for breakthrough seizures - no longer sz per neuro, ok to extubate once get agitation controlled better  - on VPA, eslicarbazepine (home med)  >>> no chantge rx      Non-oliguric AKI Lab Results  Component Value Date   CREATININE 1.50 (H) 12/28/2019   CREATININE 1.63 (H) 12/27/2019   CREATININE 1.77 (H) 12/26/2019    - suspect secondary to urinary retention> good uop , creatinine improving     Acute hypoxemic respiratory failure - secondary to clinical coma from breakthrough seizures - mechanics are good / gas exchange good so once cns issues resolved should do fine with extubation despite ? hcap R base.  Possible aspiration Pna  - Rx per flowsheet  - with mrsa screen neg this is likely MSSA, cultures pending, temp down on zosyn ? Change to ancef once cultures back   HBP/agitation driving - increase clonidine to 0.2 tid   Best practice:  Diet: tube feeds Pain/Anxiety/Delirium protocol (if indicated): see above VAP protocol (if indicated): ordered DVT prophylaxis: lovenox GI prophylaxis: pepcid Glucose control: controlled<180 Foley: yes for retention Mobility: bedrest Code Status: full Family Communication: pending  Disposition: needs ICU  Labs   CBC: Recent Labs  Lab 12/24/19 1046 12/24/19 1207 12/25/19 0400 12/25/19 0954 12/26/19 1005 12/27/19 0218 12/28/19 0616  WBC 13.5*  --   --  10.3 9.6 10.6* 8.9  NEUTROABS 4.9  --   --   --   --   --   --   HGB 17.2*   < > 17.0 15.1 14.1 13.5 12.8*  HCT 57.7*   < > 50.0 46.0 43.4 40.6 39.1  MCV 96.8  --   --  87.6 88.8 89.4 87.7  PLT 271  --   --  182 160 121* 150   < > = values in this interval not displayed.   Basic Metabolic Panel: Recent Labs  Lab 12/24/19 1046 12/24/19 1207 12/24/19 1713 12/24/19 1713 12/25/19 0400 12/25/19 0549 12/25/19 0954 12/25/19 1626 12/26/19 0536 12/26/19 1005  12/26/19 1626 12/27/19 0218 12/28/19 0616  NA 138   < > 140   < > 139 139  --   --   --  146*  --  147* 145  K 4.6   < > 4.5   < > 4.5 4.6  --   --   --  4.7  --  4.7 3.5  CL 98   < > 104  --   --  104  --   --   --  111  --  113* 110  CO2 10*   < > 25  --   --  19*  --   --   --  20*  --  22 24  GLUCOSE 216*   < >  92  --   --  140*  --   --   --  136*  --  112* 104*  BUN 16   < > 18  --   --  20  --   --   --  26*  --  23* 18  CREATININE 1.59*   < > 2.05*  --   --  2.37*  --   --   --  1.77*  --  1.63* 1.50*  CALCIUM 9.9   < > 9.1  --   --  9.0  --   --   --  8.8*  --  8.9 8.4*  MG 2.8*  --   --   --   --   --  2.4 2.4 2.3  --  2.1  --   --   PHOS 6.8*  --   --   --   --   --  2.7 2.9 2.8  --  3.0  --   --    < > = values in this interval not displayed.   GFR: Estimated Creatinine Clearance: 83.3 mL/min (A) (by C-G formula based on SCr of 1.5 mg/dL (H)). Recent Labs  Lab 12/24/19 1046 12/24/19 1046 12/24/19 1322 12/24/19 1713 12/24/19 1817 12/25/19 0954 12/26/19 1005 12/27/19 0218 12/28/19 0616  WBC 13.5*   < >  --   --   --  10.3 9.6 10.6* 8.9  LATICACIDVEN >11.0*  --  6.0* 2.0* 2.8*  --   --   --   --    < > = values in this interval not displayed.    Liver Function Tests: Recent Labs  Lab 12/24/19 1046  AST 34  ALT 19  ALKPHOS 51  BILITOT 0.1*  PROT 7.8  ALBUMIN 5.1*   Recent Labs  Lab 12/24/19 1046  LIPASE 26   Recent Labs  Lab 12/27/19 1550  AMMONIA 29    ABG    Component Value Date/Time   PHART 7.436 12/25/2019 0400   PCO2ART 32.4 12/25/2019 0400   PO2ART 107.0 12/25/2019 0400   HCO3 21.6 12/25/2019 0400   TCO2 23 12/25/2019 0400   ACIDBASEDEF 1.0 12/25/2019 0400   O2SAT 98.0 12/25/2019 0400     Coagulation Profile: Recent Labs  Lab 12/24/19 1046  INR 1.1    Cardiac Enzymes: No results for input(s): CKTOTAL, CKMB, CKMBINDEX, TROPONINI in the last 168 hours.  HbA1C: Hgb A1c MFr Bld  Date/Time Value Ref Range Status  12/24/2019  05:13 PM 5.8 (H) 4.8 - 5.6 % Final    Comment:    (NOTE) Pre diabetes:          5.7%-6.4% Diabetes:              >6.4% Glycemic control for   <7.0% adults with diabetes     CBG: Recent Labs  Lab 12/27/19 1539 12/27/19 2009 12/27/19 2346 12/28/19 0347 12/28/19 0813  GLUCAP 115* 120* 88 97 90    Critical care time:     The patient is critically ill with multiple organ systems failure and requires high complexity decision making for assessment and support, frequent evaluation and titration of therapies, application of advanced monitoring technologies and extensive interpretation of multiple databases. Critical Care Time devoted to patient care services described in this note is 45 minutes.     Christinia Gully, MD Pulmonary and [place] [phone] After 6:00 PM or weekends, use Beeper [phone]  After 7:00 pm  call Elink  [phone]

## 2019-12-28 NOTE — Progress Notes (Signed)
Subjective: No further seizures overnight.  Unfortunate patient still getting agitated which is delaying extubation.  ROS: unable to obtain due to poor mental status  Examination  Vital signs in last 24 hours: Temp:  [98.7 F (37.1 C)-100.7 F (38.2 C)] 100.6 F (38.1 C) (04/06 1140) Pulse Rate:  [54-92] 60 (04/06 1200) Resp:  [18-35] 29 (04/06 1200) BP: (134-167)/(70-95) 155/93 (04/06 1200) SpO2:  [100 %] 100 % (04/06 1200) FiO2 (%):  [30 %] 30 % (04/06 1108) Weight:  [81.5 kg] 81.5 kg (04/06 0422)  General: lying in bed, not in apparent distress CVS: pulse-normal rate and rhythm RS: breathing comfortably, intubated Extremities: normal, warm  Neuro: MS: Opens eyes to noxious stimuli,, not following commands ( recently got sedation) CN: pupils equal and reactive, tracks examiner in room, blinks to threat bilaterally, no apparent facial asymmetry  Motor:  Withdraws to noxious stimuli in all 4 extremities  Basic Metabolic Panel: Recent Labs  Lab 12/24/19 1046 12/24/19 1207 12/24/19 1713 12/24/19 1713 12/25/19 0400 12/25/19 0549 12/25/19 0549 12/25/19 0954 12/25/19 1626 12/26/19 0536 12/26/19 1005 12/26/19 1626 12/27/19 0218 12/28/19 0616  NA 138   < > 140   < > 139 139  --   --   --   --  146*  --  147* 145  K 4.6   < > 4.5   < > 4.5 4.6  --   --   --   --  4.7  --  4.7 3.5  CL 98   < > 104  --   --  104  --   --   --   --  111  --  113* 110  CO2 10*   < > 25  --   --  19*  --   --   --   --  20*  --  22 24  GLUCOSE 216*   < > 92  --   --  140*  --   --   --   --  136*  --  112* 104*  BUN 16   < > 18  --   --  20  --   --   --   --  26*  --  23* 18  CREATININE 1.59*   < > 2.05*  --   --  2.37*  --   --   --   --  1.77*  --  1.63* 1.50*  CALCIUM 9.9   < > 9.1   < >  --  9.0   < >  --   --   --  8.8*  --  8.9 8.4*  MG 2.8*  --   --   --   --   --   --  2.4 2.4 2.3  --  2.1  --   --   PHOS 6.8*  --   --   --   --   --   --  2.7 2.9 2.8  --  3.0  --   --    < > =  values in this interval not displayed.    CBC: Recent Labs  Lab 12/24/19 1046 12/24/19 1207 12/25/19 0400 12/25/19 0954 12/26/19 1005 12/27/19 0218 12/28/19 0616  WBC 13.5*  --   --  10.3 9.6 10.6* 8.9  NEUTROABS 4.9  --   --   --   --   --   --   HGB 17.2*   < > 17.0  15.1 14.1 13.5 12.8*  HCT 57.7*   < > 50.0 46.0 43.4 40.6 39.1  MCV 96.8  --   --  87.6 88.8 89.4 87.7  PLT 271  --   --  182 160 121* 150   < > = values in this interval not displayed.     Coagulation Studies: No results for input(s): LABPROT, INR in the last 72 hours.  Imaging No new brain imaging  ASSESSMENT AND PLAN: 24 year old male with history of epilepsy presented with status epilepticus.   Status epilepticus (resolved) Epilepsy with breakthrough seizures AKI Acute respiratory failure Possible aspiration pneumonia Leukocytosis Thrombocytopenia ( resolved) Hypernatremia ( resolved) Hyperglycemia -LTM EEG did not show any further seizures. -Etiology for breakthrough status epilepticus: Had subtherapeutic levels of valproic acid (39) therefore questionable noncompliance.  Recommendations -Continue valproic acid 1000-500-1000mg  ( was on 1000mg  TID)  -Continue eslicarbazepine 1200 mg daily and Klonopin 1 mg 3 times daily -Management of rest of the comorbidities per primary team -As needed IV Ativan 2 mg for generalized tonic-clonic seizure lasting more than 2 minutes or focal seizure lasting more than 5 minutes  CRITICAL CARE Performed by:   Total critical care time: 35 minutes  Critical care time was exclusive of separately billable procedures and treating other patients.  Critical care was necessary to treat or prevent imminent or life-threatening deterioration.  Critical care was time spent personally by me on the following activities: development of treatment plan with patient and/or surrogate as well as nursing, discussions with consultants, evaluation of  patient's response to treatment, examination of patient, obtaining history from patient or surrogate, ordering and performing treatments and interventions, ordering and review of laboratory studies, ordering and review of radiographic studies, pulse oximetry and re-evaluation of patient's condition.

## 2019-12-28 NOTE — Progress Notes (Signed)
eLink Physician-Brief Progress Note Patient Name: Dylan Moon DOB: 09-10-1996 MRN: [id number]   Date of Service  12/28/2019  HPI/Events of Note  Agitated Delirium - Currently on a Precedex IV infusion at 1.7 mcg/kg/min and orders have a ceiling of 1.2 mcg/kg/hour.   eICU Interventions  Will increase ceiling on Precedex IV infusion to 1.7 mcg/kg/hour.     Intervention Category Major Interventions: Delirium, psychosis, severe agitation - evaluation and management  Raad Clayson Eugene 12/28/2019, 8:03 PM

## 2019-12-29 LAB — BASIC METABOLIC PANEL
Anion gap: 10 (ref 5–15)
BUN: 14 mg/dL (ref 6–20)
CO2: 25 mmol/L (ref 22–32)
Calcium: 8.6 mg/dL — ABNORMAL LOW (ref 8.9–10.3)
Chloride: 107 mmol/L (ref 98–111)
Creatinine, Ser: 1.47 mg/dL — ABNORMAL HIGH (ref 0.61–1.24)
GFR calc Af Amer: >60 mL/min (ref >60)
GFR calc non Af Amer: >60 mL/min (ref >60)
Glucose, Bld: 97 mg/dL (ref 70–99)
Potassium: 4.1 mmol/L (ref 3.5–5.1)
Sodium: 142 mmol/L (ref 135–145)

## 2019-12-29 LAB — CULTURE, RESPIRATORY W GRAM STAIN: Special Requests: NORMAL

## 2019-12-29 LAB — CBC
HCT: 42.1 % (ref 39.0–52.0)
Hemoglobin: 13.6 g/dL (ref 13.0–17.0)
MCH: 28.7 pg (ref 26.0–34.0)
MCHC: 32.3 g/dL (ref 30.0–36.0)
MCV: 88.8 fL (ref 80.0–100.0)
Platelets: 185 10*3/uL (ref 150–400)
RBC: 4.74 MIL/uL (ref 4.22–5.81)
RDW: 13.1 % (ref 11.5–15.5)
WBC: 9.6 10*3/uL (ref 4.0–10.5)
nRBC: 0 % (ref 0.0–0.2)

## 2019-12-29 LAB — GLUCOSE, CAPILLARY
Glucose-Capillary: 76 mg/dL (ref 70–99)
Glucose-Capillary: 87 mg/dL (ref 70–99)
Glucose-Capillary: 87 mg/dL (ref 70–99)
Glucose-Capillary: 88 mg/dL (ref 70–99)
Glucose-Capillary: 91 mg/dL (ref 70–99)

## 2019-12-29 LAB — CULTURE, BLOOD (ROUTINE X 2)
Culture: NO GROWTH
Culture: NO GROWTH
Special Requests: ADEQUATE

## 2019-12-29 MED ORDER — ACETAMINOPHEN 325 MG PO TABS
650.0000 mg | ORAL_TABLET | Freq: Four times a day (QID) | ORAL | Status: DC | PRN
  Administered 2019-12-29 – 2019-12-30 (×3): 650 mg via ORAL
  Filled 2019-12-29 (×3): qty 2

## 2019-12-29 MED ORDER — BUSPIRONE HCL 10 MG PO TABS
5.0000 mg | ORAL_TABLET | Freq: Three times a day (TID) | ORAL | Status: DC
  Administered 2019-12-29 – 2020-01-01 (×11): 5 mg via ORAL
  Filled 2019-12-29 (×11): qty 1

## 2019-12-29 MED ORDER — CLONIDINE HCL 0.2 MG PO TABS
0.3000 mg | ORAL_TABLET | Freq: Three times a day (TID) | ORAL | Status: DC
  Administered 2019-12-29 – 2019-12-30 (×6): 0.3 mg via ORAL
  Filled 2019-12-29 (×6): qty 1

## 2019-12-29 MED ORDER — CLONIDINE HCL 0.2 MG PO TABS
0.2000 mg | ORAL_TABLET | Freq: Three times a day (TID) | ORAL | Status: DC
  Filled 2019-12-29: qty 1

## 2019-12-29 MED ORDER — CEFDINIR 300 MG PO CAPS
300.0000 mg | ORAL_CAPSULE | Freq: Two times a day (BID) | ORAL | Status: DC
  Administered 2019-12-29 – 2020-01-01 (×7): 300 mg via ORAL
  Filled 2019-12-29 (×8): qty 1

## 2019-12-29 MED ORDER — HALOPERIDOL LACTATE 5 MG/ML IJ SOLN
5.0000 mg | Freq: Once | INTRAMUSCULAR | Status: AC
  Administered 2019-12-29: 5 mg via INTRAMUSCULAR

## 2019-12-29 MED ORDER — VALPROIC ACID 250 MG PO CAPS
1000.0000 mg | ORAL_CAPSULE | Freq: Two times a day (BID) | ORAL | Status: DC
  Administered 2019-12-29 – 2020-01-01 (×7): 1000 mg via ORAL
  Filled 2019-12-29 (×9): qty 4

## 2019-12-29 MED ORDER — ACETAMINOPHEN 160 MG/5ML PO SOLN: 650.0000 mg | Freq: Four times a day (QID) | ORAL | Status: DC | PRN

## 2019-12-29 MED ORDER — ESLICARBAZEPINE ACETATE 800 MG PO TABS
1200.0000 mg | ORAL_TABLET | Freq: Every day | ORAL | Status: DC
  Administered 2019-12-29 – 2020-01-01 (×4): 1200 mg via ORAL
  Filled 2019-12-29 (×3): qty 1

## 2019-12-29 MED ORDER — BOOST / RESOURCE BREEZE PO LIQD CUSTOM
1.0000 | Freq: Three times a day (TID) | ORAL | Status: DC
  Administered 2019-12-29 – 2019-12-30 (×4): 1 via ORAL

## 2019-12-29 MED ORDER — VALPROIC ACID 250 MG/5ML PO SOLN: 1000.0000 mg | Freq: Two times a day (BID) | ORAL | Status: DC

## 2019-12-29 MED ORDER — FAMOTIDINE 40 MG/5ML PO SUSR: 20.0000 mg | Freq: Two times a day (BID) | ORAL | Status: DC

## 2019-12-29 MED ORDER — VALPROIC ACID 250 MG PO CAPS
500.0000 mg | ORAL_CAPSULE | Freq: Every day | ORAL | Status: DC
  Administered 2019-12-29 – 2020-01-01 (×4): 500 mg via ORAL
  Filled 2019-12-29 (×4): qty 2

## 2019-12-29 MED ORDER — CLONAZEPAM 0.5 MG PO TBDP
1.0000 mg | ORAL_TABLET | Freq: Three times a day (TID) | ORAL | Status: DC
  Administered 2019-12-29 – 2020-01-01 (×10): 1 mg via ORAL
  Filled 2019-12-29 (×10): qty 2

## 2019-12-29 MED ORDER — QUETIAPINE FUMARATE 25 MG PO TABS
25.0000 mg | ORAL_TABLET | Freq: Two times a day (BID) | ORAL | Status: DC
  Administered 2019-12-29 – 2019-12-30 (×3): 25 mg via ORAL
  Filled 2019-12-29 (×3): qty 1

## 2019-12-29 NOTE — Progress Notes (Signed)
Subjective: No acute events overnight.  Patient extubated now.  ROS: Unable to obtain due to poor mental status  Examination  Vital signs in last 24 hours: Temp:  [98 F (36.7 C)-102.8 F (39.3 C)] 101.2 F (38.4 C) (04/07 1149) Pulse Rate:  [50-95] 87 (04/07 1200) Resp:  [8-42] 30 (04/07 1149) BP: (129-172)/(54-105) 143/84 (04/07 1200) SpO2:  [94 %-100 %] 100 % (04/07 1200) Weight:  [83.8 kg] 83.8 kg (04/07 0311)  General: lying in bed, not in apparent distress CVS: pulse-normal rate and rhythm RS: breathing comfortably, CTAB Extremities: normal, warm  Neuro: MS: Alert, able to tell me his name, not following commands CN: pupils equal and reactive,  EOMI, face symmetric, Motor: Antigravity strength in all 4 extremities Reflexes: 2+ bilaterally over patella, biceps, plantars: flexor  Basic Metabolic Panel: Recent Labs  Lab 12/24/19 1046 12/24/19 1207 12/25/19 0549 12/25/19 0549 12/25/19 0954 12/25/19 1626 12/26/19 0536 12/26/19 1005 12/26/19 1005 12/26/19 1626 12/27/19 0218 12/28/19 0616 12/29/19 0047  NA 138   < > 139  --   --   --   --  146*  --   --  147* 145 142  K 4.6   < > 4.6  --   --   --   --  4.7  --   --  4.7 3.5 4.1  CL 98   < > 104  --   --   --   --  111  --   --  113* 110 107  CO2 10*   < > 19*  --   --   --   --  20*  --   --  22 24 25   GLUCOSE 216*   < > 140*  --   --   --   --  136*  --   --  112* 104* 97  BUN 16   < > 20  --   --   --   --  26*  --   --  23* 18 14  CREATININE 1.59*   < > 2.37*  --   --   --   --  1.77*  --   --  1.63* 1.50* 1.47*  CALCIUM 9.9   < > 9.0   < >  --   --   --  8.8*   < >  --  8.9 8.4* 8.6*  MG 2.8*  --   --   --  2.4 2.4 2.3  --   --  2.1  --   --   --   PHOS 6.8*  --   --   --  2.7 2.9 2.8  --   --  3.0  --   --   --    < > = values in this interval not displayed.    CBC: Recent Labs  Lab 12/24/19 1046 12/24/19 1207 12/25/19 0954 12/26/19 1005 12/27/19 0218 12/28/19 0616 12/29/19 0047  WBC 13.5*   < >  10.3 9.6 10.6* 8.9 9.6  NEUTROABS 4.9  --   --   --   --   --   --   HGB 17.2*   < > 15.1 14.1 13.5 12.8* 13.6  HCT 57.7*   < > 46.0 43.4 40.6 39.1 42.1  MCV 96.8   < > 87.6 88.8 89.4 87.7 88.8  PLT 271   < > 182 160 121* 150 185   < > = values in this interval not displayed.  Coagulation Studies: No results for input(s): LABPROT, INR in the last 72 hours.  Imaging No new brain imaging  ASSESSMENT AND PLAN: 24 year old male with history of epilepsy presented with status epilepticus.   Status epilepticus (resolved) Epilepsy with breakthrough seizures AKI Acute respiratory failure Possible aspiration pneumonia Leukocytosis Thrombocytopenia ( resolved) Hypernatremia ( resolved) Hyperglycemia -LTM EEG did not show any further seizures. -Etiology for breakthrough status epilepticus: Had subtherapeutic levels of valproic acid(39)therefore questionable noncompliance.  Recommendations -Continue valproic acid 1000-500-1000mg  ( was on 1000mg  TID) , Eslicarbazepine 9458 mg daily and Klonopin 1 mg 3 times daily -Klonopin was started for agitation in addition to seizure control and can most likely be tapered off as an outpatient once patient is able to follow-up with his primary epileptologist. -As needed IV Ativan 2 mg for generalized tonic-clonic seizure lasting more than 2 minutes or focal seizure lasting more than 5 minutes while inpatient -Continue seizure precautions -Follow-up with primary epileptologist in 8 to 12 weeks after discharge -Management of rest of comorbidities per primary team  I have spent a total of  25 minutes with the patient reviewing hospital notes,  test results, labs and examining the patient as well as establishing an assessment and plan that was discussed personally with the patient's team.  > 50% of time was spent in direct patient care.

## 2019-12-29 NOTE — Consult Note (Signed)
Attempted to complete psychiatry consult as ordered.  Patient currently restrained and unable to participate in telemedicine psychiatry consult at this time.  Will attempt psychiatry consult again tomorrow.

## 2019-12-29 NOTE — Progress Notes (Signed)
Nutrition Follow-up  DOCUMENTATION CODES:   Not applicable  INTERVENTION:   Boost Breeze po TID, each supplement provides 250 kcal and 9 grams of protein  Encourage PO intake   NUTRITION DIAGNOSIS:   Inadequate oral intake related to inability to eat as evidenced by NPO status. Progressing  GOAL:   Patient will meet greater than or equal to 90% of their needs Progressing.   MONITOR:   TF tolerance, Vent status  REASON FOR ASSESSMENT:   Consult, Ventilator Enteral/tube feeding initiation and management  ASSESSMENT:   Pt with PMH of ADHD and epilepsy who was admitted with status epilepticus.   4/6 pt extubated  Pt sleeping during visit. Noted pt has been agitated and on precedex.   Diet Order:   Diet Order            Diet clear liquid Room service appropriate? Yes; Fluid consistency: Thin  Diet effective now              EDUCATION NEEDS:   No education needs have been identified at this time  Skin:  Skin Assessment: Reviewed RN Assessment  Last BM:  unknown  Height:   Ht Readings from Last 1 Encounters:  12/24/19 6' (1.829 m)    Weight:   Wt Readings from Last 1 Encounters:  12/29/19 83.8 kg    Ideal Body Weight:  80.9 kg  BMI:  Body mass index is 25.06 kg/m.  Estimated Nutritional Needs:   Kcal:  2200-2400  Protein:  100-120 grams  Fluid:  2 L/day  Cammy Copa., RD, LDN, CNSC See AMiON for contact information

## 2019-12-29 NOTE — Progress Notes (Signed)
Attempted psych consult with ipad.  Pt too sleepy and confused to continue at this time.  Pt was agitated prior to this and required haldol.  Pt is also in restraints and on precedex.  Will attempt consult again tomorrow.

## 2019-12-29 NOTE — Progress Notes (Addendum)
NAME:  Dylan Moon, MRN:  [id number], DOB:  Sep 21, 1996, LOS: 5 ADMISSION DATE:  12/24/2019,  REFERRING MD:  EDP, CHIEF COMPLAINT:  Status epilepticus.  Brief History   Dylan Moon is a 24 y.o male with ADHD and epilepsy who presented to the emergency department with status epilepticus. He was subsequently intubated and admitted to the ICU for burst suppression. Mother states that he is compliant with all of his AEDs =  Depakote ER 3000 mg qhs and eslicarbazepine 800 mg tabs 1.5 tabs per day. The mother states that he typically has a seizure at least monthly. Possible etoh ingestion prior to admission.  Consults:  Neurology  Procedures:  Endotracheal intubation 4/2 >> 4/6  Significant Diagnostic Tests:  CT head 4/2:  1. No evidence of acute intracranial abnormality. 2. Left sphenoid sinus mucous retention cyst.  CT cervical spine 4/2:  1. No evidence of acute fracture to the cervical spine. 2. Nonspecific reversal of the expected cervical lordosis, which may be positional.  EEG 4/3 Abnormality - Sharp waves, left frontotemporal region - Continuous slow, generalized - Excessive beta, generalized  IMPRESSION: This study issuggestive of epileptogenicity in left frontotemporal region. Additionally, there is evidence of severe diffuse encephalopathy, nonspecific to etiology but most likely secondary to sedation.No seizures were seen throughout the recording.  Micro Data:  MRSA  PCR  4/2  Neg  Urine culture 4/1 Neg  Resp viral panel 4/2  Neg Blood cultures 4/2  >>> Resp culture 4/5 >>> staph >>>   Antimicrobials:  Zosyn  (? HCAP)  4/5 >>>   Scheduled Meds: . Chlorhexidine Gluconate Cloth  6 each Topical Daily  . clonazepam  1 mg Per Tube TID  . cloNIDine  0.2 mg Per Tube TID  . enoxaparin (LOVENOX) injection  40 mg Subcutaneous Q24H  . Eslicarbazepine Acetate  1,200 mg Per Tube Daily  . famotidine  20 mg Per Tube BID  . insulin aspart  0-6 Units  Subcutaneous Q4H  . QUEtiapine  25 mg Per Tube BID  . valproic acid  1,000 mg Per Tube BID  . valproic acid  500 mg Oral Q1200   Continuous Infusions: . dexmedetomidine (PRECEDEX) IV infusion 1.7 mcg/kg/hr (12/29/19 0724)  . dextrose 100 mL/hr at 12/28/19 1928  . feeding supplement (VITAL AF 1.2 CAL) 1,000 mL (12/27/19 1629)  . piperacillin-tazobactam (ZOSYN)  IV 12.5 mL/hr at 12/29/19 0700   PRN Meds:.acetaminophen (TYLENOL) oral liquid 160 mg/5 mL, acetaminophen, fentaNYL (SUBLIMAZE) injection, haloperidol lactate, midazolam, ondansetron (ZOFRAN) IV   Interim history/subjective:  Extubated yesterday.  Has significant agitation post extubation requiring reinitiation of Precedex, Haldol and restraints He is calmer today.  Precedex is weaning down.  Objective   Blood pressure (!) 141/54, pulse 84, temperature 98.6 F (37 C), temperature source Axillary, resp. rate (!) 24, height 6' (1.829 m), weight 83.8 kg, SpO2 100 %.    Vent Mode: CPAP;PSV FiO2 (%):  [30 %] 30 % Set Rate:  [18 bmp] 18 bmp Vt Set:  [620 mL] 620 mL PEEP:  [5 cmH20] 5 cmH20 Pressure Support:  [10 cmH20] 10 cmH20   Intake/Output Summary (Last 24 hours) at 12/29/2019 0759 Last data filed at 12/29/2019 0700 Gross per 24 hour  Intake 974.17 ml  Output 2410 ml  Net -1435.83 ml   Filed Weights   12/26/19 0500 12/28/19 0422 12/29/19 0311  Weight: 81.2 kg 81.5 kg 83.8 kg    Examination: Gen:      No acute distress HEENT:  EOMI, sclera anicteric Neck:     No masses; no thyromegaly Lungs:    Clear to auscultation bilaterally; normal respiratory effort CV:         Regular rate and rhythm; no murmurs Abd:      + bowel sounds; soft, non-tender; no palpable masses, no distension Ext:    No edema; adequate peripheral perfusion Skin:      Warm and dry; no rash Neuro: Awake, oriented, directable and responds to questions.   Labs significant for BUN/creatinine 14/1.47, WBC 9.6   Resolved Hospital Problem list      Assessment & Plan:    Seizure disorder with concern for breakthrough seizures No more seizures.  Discussed with neurology.  He is stable for discharge on his current medication regimen Follow-up with his epileptologist, neurologist at [facility].   Non-oliguric AKI Suspect secondary to urinary retention> good uop , creatinine improving  Patient is voiding spontaneously now with no ongoing evidence of urinary retention   Acute hypoxemic respiratory failure Secondary to clinical coma from breakthrough seizures Stable post extubation, on room air  Possible aspiration Pna  - Rx per flowsheet  - with mrsa screen neg this is likely MSSA Change antibiotic to cefdinir for total 7-day course of antibiotics  Agitation, delirium Per mother he has significant anxiety about being in the hospital We will plan on discharge home once he can get him safely of Precedex Restart home BuSpar. Psychiatry consult  Best practice:  Diet: Initiating clears. Pain/Anxiety/Delirium protocol (if indicated): NA VAP protocol (if indicated): NA DVT prophylaxis: lovenox GI prophylaxis: pepcid > DC Glucose control: controlled<180 Foley: No. Mobility: bedrest Code Status: full Family Communication: Updated mom Disposition: ICU  The patient is critically ill with multiple organ system failure and requires high complexity decision making for assessment and support, frequent evaluation and titration of therapies, advanced monitoring, review of radiographic studies and interpretation of complex data.   Critical Care Time devoted to patient care services, exclusive of separately billable procedures, described in this note is 35 minutes.   Marshell Garfinkel MD [facility] Please see [email or web address] for pager details.  12/29/2019, 2:14 PM

## 2019-12-29 NOTE — Progress Notes (Signed)
eLink Physician-Brief Progress Note Patient Name: Dylan Moon DOB: 06-24-1996 MRN: [id number]   Date of Service  12/29/2019  HPI/Events of Note  Agitation - Patient removed all of his IV's.  eICU Interventions  Haldol 5 mg IM X 1 now.      Intervention Category Major Interventions: Delirium, psychosis, severe agitation - evaluation and management  [name] 12/29/2019, 6:13 AM

## 2019-12-30 LAB — CBC
HCT: 44.2 % (ref 39.0–52.0)
Hemoglobin: 14.3 g/dL (ref 13.0–17.0)
MCH: 28.5 pg (ref 26.0–34.0)
MCHC: 32.4 g/dL (ref 30.0–36.0)
MCV: 88 fL (ref 80.0–100.0)
Platelets: 262 10*3/uL (ref 150–400)
RBC: 5.02 MIL/uL (ref 4.22–5.81)
RDW: 12.9 % (ref 11.5–15.5)
WBC: 10.3 10*3/uL (ref 4.0–10.5)
nRBC: 0 % (ref 0.0–0.2)

## 2019-12-30 LAB — BASIC METABOLIC PANEL
Anion gap: 11 (ref 5–15)
BUN: 18 mg/dL (ref 6–20)
CO2: 27 mmol/L (ref 22–32)
Calcium: 8.8 mg/dL — ABNORMAL LOW (ref 8.9–10.3)
Chloride: 103 mmol/L (ref 98–111)
Creatinine, Ser: 1.36 mg/dL — ABNORMAL HIGH (ref 0.61–1.24)
GFR calc Af Amer: >60 mL/min (ref >60)
GFR calc non Af Amer: >60 mL/min (ref >60)
Glucose, Bld: 92 mg/dL (ref 70–99)
Potassium: 3.3 mmol/L — ABNORMAL LOW (ref 3.5–5.1)
Sodium: 141 mmol/L (ref 135–145)

## 2019-12-30 LAB — GLUCOSE, CAPILLARY
Glucose-Capillary: 84 mg/dL (ref 70–99)
Glucose-Capillary: 98 mg/dL (ref 70–99)
Glucose-Capillary: 120 mg/dL — ABNORMAL HIGH (ref 70–99)
Glucose-Capillary: 170 mg/dL — ABNORMAL HIGH (ref 70–99)
Glucose-Capillary: 98 mg/dL (ref 70–99)
Glucose-Capillary: 74 mg/dL (ref 70–99)

## 2019-12-30 MED ORDER — QUETIAPINE FUMARATE 25 MG PO TABS
50.0000 mg | ORAL_TABLET | Freq: Every day | ORAL | Status: DC
  Administered 2019-12-30: 50 mg via ORAL
  Filled 2019-12-30: qty 2

## 2019-12-30 MED ORDER — QUETIAPINE FUMARATE 25 MG PO TABS: 50.0000 mg | ORAL_TABLET | Freq: Every day | ORAL | Status: DC

## 2019-12-30 MED ORDER — POTASSIUM CHLORIDE CRYS ER 20 MEQ PO TBCR
40.0000 meq | EXTENDED_RELEASE_TABLET | Freq: Once | ORAL | Status: AC
  Administered 2019-12-30: 40 meq via ORAL
  Filled 2019-12-30: qty 2

## 2019-12-30 NOTE — Evaluation (Signed)
Physical Therapy Evaluation Patient Details Name: Dylan Moon MRN: [id number] DOB: 1996/08/24 Today's Date: 12/30/2019   History of Present Illness  24 y.o male with ADHD and epilepsy who presented to the emergency department with status epilepticus. He was subsequently intubated and admitted to the ICU for burst suppression. Mother states that he is compliant with all of his AEDs =  Depakote ER 4098 mg qhs and eslicarbazepine 119 mg tabs 1.5 tabs per day. The mother states that he typically has a seizure at least monthly. Possible etoh ingestion prior to admission.  Clinical Impression  Pt presents to PT with deficits in functional mobility, gait, balance, endurance, power, safety awareness, awareness of deficits. Pt perseverating about leaving the hospital to go to [place]. Pt requires frequent redirection during session, is impulsive, and demonstrates a lack of awareness of deficits. Pt is very unsteady during ambulation and demonstrates brief periods of lethargy while sitting at edge of bed. Pt will benefit from continued acute PT POC to improve balance and restore independent mobility.    Follow Up Recommendations CIR;Supervision/Assistance - 24 hour    Equipment Recommendations  (defer to post-acute setting, RW currently if D/C home)    Recommendations for Other Services       Precautions / Restrictions Precautions Precautions: Fall(seizure) Restrictions Weight Bearing Restrictions: No      Mobility  Bed Mobility Overal bed mobility: Needs Assistance Bed Mobility: Supine to Sit;Sit to Supine     Supine to sit: Min guard Sit to supine: Min guard      Transfers Overall transfer level: Needs assistance Equipment used: 2 person hand held assist Transfers: Sit to/from Stand Sit to Stand: Min assist;+2 physical assistance            Ambulation/Gait Ambulation/Gait assistance: Min assist;+2 physical assistance Gait Distance (Feet): 40 Feet Assistive device: 1  person hand held assist;IV Pole Gait Pattern/deviations: Step-to pattern;Staggering left;Staggering right Gait velocity: reduced Gait velocity interpretation: <1.8 ft/sec, indicate of risk for recurrent falls General Gait Details: pt very unsteady, swaying in all directions, bumping IV pole into multiple objects in room. Pt requires verbal cues for direction and for safety awareness/device management  Stairs            Wheelchair Mobility    Modified Rankin (Stroke Patients Only)       Balance Overall balance assessment: Needs assistance Sitting-balance support: No upper extremity supported;Feet supported Sitting balance-Leahy Scale: Fair Sitting balance - Comments: minG at edge of bed, dons socks   Standing balance support: Bilateral upper extremity supported Standing balance-Leahy Scale: Poor Standing balance comment: min-modA for standing balance during gait                             Pertinent Vitals/Pain Pain Assessment: Faces Faces Pain Scale: No hurt    Home Living Family/patient expects to be discharged to:: Private residence Living Arrangements: Parent Available Help at Discharge: Family;Available PRN/intermittently Type of Home: House Home Access: Level entry     Home Layout: One level Home Equipment: None      Prior Function Level of Independence: Independent         Comments: pt is a Geophysicist/field seismologist, 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     Hand Dominance        Extremity/Trunk Assessment   Upper Extremity Assessment Upper Extremity Assessment: Overall WFL for tasks assessed    Lower Extremity Assessment Lower Extremity  Assessment: Overall WFL for tasks assessed    Cervical / Trunk Assessment Cervical / Trunk Assessment: Normal  Communication   Communication: Other (comment)(dysarthria)  Cognition Arousal/Alertness: Awake/alert(intermittent periods of lethargy) Behavior During Therapy:  Restless;Impulsive Overall Cognitive Status: Impaired/Different from baseline Area of Impairment: Orientation;Attention;Memory;Following commands;Safety/judgement;Awareness                 Orientation Level: Disoriented to;Time;Situation Current Attention Level: Alternating Memory: Decreased recall of precautions;Decreased short-term memory Following Commands: Follows one step commands consistently;Follows multi-step commands with increased time Safety/Judgement: Decreased awareness of safety;Decreased awareness of deficits Awareness: Emergent          General Comments General comments (skin integrity, edema, etc.): pt with labile HR during session, 80s-90s for most of session but with tachycardia up to 136 seated at edge of bed after ambulation. Pt set on leaving [facility] to go to [facility]    Exercises     Assessment/Plan    PT Assessment Patient needs continued PT services  PT Problem List Decreased strength;Decreased activity tolerance;Decreased balance;Decreased mobility;Decreased cognition;Decreased knowledge of use of DME;Decreased safety awareness;Decreased knowledge of precautions       PT Treatment Interventions DME instruction;Gait training;Stair training;Functional mobility training;Therapeutic activities;Therapeutic exercise;Balance training;Neuromuscular re-education;Patient/family education    PT Goals (Current goals can be found in the Care Plan section)  Acute Rehab PT Goals Patient Stated Goal: To go home PT Goal Formulation: With patient Time For Goal Achievement: 01/13/20 Potential to Achieve Goals: Good    Frequency Min 4X/week   Barriers to discharge        Co-evaluation               AM-PAC PT "6 Clicks" Mobility  Outcome Measure Help needed turning from your back to your side while in a flat bed without using bedrails?: A Little Help needed moving from lying on your back to sitting on the side of a flat bed without using bedrails?: A  Little Help needed moving to and from a bed to a chair (including a wheelchair)?: A Little Help needed standing up from a chair using your arms (e.g., wheelchair or bedside chair)?: A Little Help needed to walk in hospital room?: A Lot Help needed climbing 3-5 steps with a railing? : Total 6 Click Score: 15    End of Session   Activity Tolerance: Patient tolerated treatment well Patient left: in bed;with call bell/phone within reach;with restraints reapplied Nurse Communication: Mobility status PT Visit Diagnosis: Unsteadiness on feet (R26.81);Other symptoms and signs involving the nervous system (R29.898)    Time: 2876-8115 PT Time Calculation (min) (ACUTE ONLY): 35 min   Charges:   PT Evaluation $PT Eval High Complexity: 1 High PT Treatments $Gait Training: 8-22 mins        Arlyss Gandy, PT, DPT Acute Rehabilitation Pager: [phone]   Arlyss Gandy 12/30/2019, 2:27 PM

## 2019-12-30 NOTE — Consult Note (Signed)
[facility] Face-to-Face Psychiatry Consult   Reason for Consult:  "delirium, psychosis. Underlying bipolar, ADHD. Need help with manangemnt of acute symptoms" Referring Physician:  Dr. Vaughan Browner Patient Identification: Dylan Moon MRN:  [id number] Principal Diagnosis: <principal problem not specified> Diagnosis:  Active Problems:   AKI (acute kidney injury) ([place])   Status epilepticus ([place])   Acute respiratory failure with hypoxemia ([place])   HCAP (healthcare-associated pneumonia)   Total Time spent with patient: 30 minutes  Subjective:   Dylan Moon is a 24 y.o. male patient.  Patient assessed by nurse practitioner.  Patient alert and oriented, answers appropriately. Patient states "I want to go to [facility] they treat me better there."  Patient gestures toward mittens currently applied to hands. Patient denies homicidal and suicidal ideations.  Patient denies history of self-harm, denies history of suicide attempts.  Patient denies auditory visual hallucinations.  Patient denies symptoms of paranoia.  Patient denies alcohol and substance use.  Patient reports sleep is "good." Patient denies current outpatient psychiatry.  Patient reports compliant with Depakote, patient unable to articulate home medications at this time.  Patient reports he lives with his mother who helps manage his medications.  Patient gives verbal consent to reach out to his mother Geanie Kenning. Patient's mother reports patient compliant with Depakote and eslicarbazepine when at home.  Patient is mother reports BuSpar ordered 3 times daily however patient refuses to take BuSpar more than once daily at home.  Patient's mother states "he was not angry like this until the seizure started now he is angry all the time."  Patient's mother reports patient "smokes a lot of weed and I know his friends use "zans"."  HPI: Patient admitted with seizure activity, history of seizure disorder.  Past Psychiatric History: Cannabis  use disorder, adjustment disorder with mixed disturbance of emotions and conduct  Risk to Self:  Denies Risk to Others:  Denies Prior Inpatient Therapy:  Denies Prior Outpatient Therapy:  Denies  Past Medical History:  Past Medical History:  Diagnosis Date  . ADHD   . Scoliosis   . Seizures ([place])    most recent 12/02/17    Past Surgical History:  Procedure Laterality Date  . NO PAST SURGERIES     Family History:  Family History  Problem Relation Age of Onset  . Diabetes Mother   . Hypertension Mother   . Cancer Other   . Diabetes Father   . Seizures Maternal Grandfather    Family Psychiatric  History: Denies Social History:  Social History   Substance and Sexual Activity  Alcohol Use No     Social History   Substance and Sexual Activity  Drug Use Yes  . Types: Marijuana   Comment: Daily use of marijuana.    Social History   Socioeconomic History  . Marital status: Single    Spouse name: Not on file  . Number of children: 0  . Years of education: HS  . Highest education level: Not on file  Occupational History  . Occupation: Horticulturist, commercial  Tobacco Use  . Smoking status: Never Smoker  . Smokeless tobacco: Never Used  Substance and Sexual Activity  . Alcohol use: No  . Drug use: Yes    Types: Marijuana    Comment: Daily use of marijuana.  . Sexual activity: Not Currently  Other Topics Concern  . Not on file  Social History Narrative   Lives at home his mother.   3-4 sodas per week.  Right-handed.   Social Determinants of Health   Financial Resource Strain:   . Difficulty of Paying Living Expenses:   Food Insecurity:   . Worried About Programme researcher, broadcasting/film/video in the Last Year:   . Barista in the Last Year:   Transportation Needs:   . Freight forwarder (Medical):   Marland Kitchen Lack of Transportation (Non-Medical):   Physical Activity:   . Days of Exercise per Week:   . Minutes of Exercise per Session:   Stress:   . Feeling  of Stress :   Social Connections:   . Frequency of Communication with Friends and Family:   . Frequency of Social Gatherings with Friends and Family:   . Attends Religious Services:   . Active Member of Clubs or Organizations:   . Attends Banker Meetings:   Marland Kitchen Marital Status:    Additional Social History:    Allergies:   Allergies  Allergen Reactions  . Keppra [Levetiracetam] Other (See Comments)    Irritability   . Tramadol Other (See Comments)    Contraindicated with current medications (??)  . Vimpat [Lacosamide] Other (See Comments)    Causes anger    Labs:  Results for orders placed or performed during the hospital encounter of 12/24/19 (from the past 48 hour(s))  Glucose, capillary     Status: None   Collection Time: 12/28/19 11:40 PM  Result Value Ref Range   Glucose-Capillary 86 70 - 99 mg/dL    Comment: Glucose reference range applies only to samples taken after fasting for at least 8 hours.  Basic metabolic panel     Status: Abnormal   Collection Time: 12/29/19 12:47 AM  Result Value Ref Range   Sodium 142 135 - 145 mmol/L   Potassium 4.1 3.5 - 5.1 mmol/L   Chloride 107 98 - 111 mmol/L   CO2 25 22 - 32 mmol/L   Glucose, Bld 97 70 - 99 mg/dL    Comment: Glucose reference range applies only to samples taken after fasting for at least 8 hours.   BUN 14 6 - 20 mg/dL   Creatinine, Ser 7.84 (H) 0.61 - 1.24 mg/dL   Calcium 8.6 (L) 8.9 - 10.3 mg/dL   GFR calc non Af Amer >60 >60 mL/min   GFR calc Af Amer >60 >60 mL/min   Anion gap 10 5 - 15    Comment: Performed at [facility], [address] [place], [place] [address]  CBC     Status: None   Collection Time: 12/29/19 12:47 AM  Result Value Ref Range   WBC 9.6 4.0 - 10.5 K/uL   RBC 4.74 4.22 - 5.81 MIL/uL   Hemoglobin 13.6 13.0 - 17.0 g/dL   HCT 81.3 88.7 - 19.5 %   MCV 88.8 80.0 - 100.0 fL   MCH 28.7 26.0 - 34.0 pg   MCHC 32.3 30.0 - 36.0 g/dL   RDW 97.4 71.8 - 55.0 %   Platelets 185  150 - 400 K/uL   nRBC 0.0 0.0 - 0.2 %    Comment: Performed at [facility], [address] [place], [place] [address]  Glucose, capillary     Status: None   Collection Time: 12/29/19  8:22 AM  Result Value Ref Range   Glucose-Capillary 76 70 - 99 mg/dL    Comment: Glucose reference range applies only to samples taken after fasting for at least 8 hours.   Comment 1 Notify RN  Comment 2 Document in Chart   Glucose, capillary     Status: None   Collection Time: 12/29/19 12:15 PM  Result Value Ref Range   Glucose-Capillary 88 70 - 99 mg/dL    Comment: Glucose reference range applies only to samples taken after fasting for at least 8 hours.   Comment 1 Notify RN    Comment 2 Document in Chart   Glucose, capillary     Status: None   Collection Time: 12/29/19  3:59 PM  Result Value Ref Range   Glucose-Capillary 87 70 - 99 mg/dL    Comment: Glucose reference range applies only to samples taken after fasting for at least 8 hours.   Comment 1 Notify RN    Comment 2 Document in Chart   Glucose, capillary     Status: None   Collection Time: 12/29/19  8:01 PM  Result Value Ref Range   Glucose-Capillary 87 70 - 99 mg/dL    Comment: Glucose reference range applies only to samples taken after fasting for at least 8 hours.  Glucose, capillary     Status: None   Collection Time: 12/29/19 11:43 PM  Result Value Ref Range   Glucose-Capillary 91 70 - 99 mg/dL    Comment: Glucose reference range applies only to samples taken after fasting for at least 8 hours.  Glucose, capillary     Status: None   Collection Time: 12/30/19  3:45 AM  Result Value Ref Range   Glucose-Capillary 84 70 - 99 mg/dL    Comment: Glucose reference range applies only to samples taken after fasting for at least 8 hours.  Basic metabolic panel     Status: Abnormal   Collection Time: 12/30/19  6:40 AM  Result Value Ref Range   Sodium 141 135 - 145 mmol/L   Potassium 3.3 (L) 3.5 - 5.1 mmol/L   Chloride 103 98 - 111  mmol/L   CO2 27 22 - 32 mmol/L   Glucose, Bld 92 70 - 99 mg/dL    Comment: Glucose reference range applies only to samples taken after fasting for at least 8 hours.   BUN 18 6 - 20 mg/dL   Creatinine, Ser 6.14 (H) 0.61 - 1.24 mg/dL   Calcium 8.8 (L) 8.9 - 10.3 mg/dL   GFR calc non Af Amer >60 >60 mL/min   GFR calc Af Amer >60 >60 mL/min   Anion gap 11 5 - 15    Comment: Performed at [facility], [address], [place], [place] [address]  CBC     Status: None   Collection Time: 12/30/19  6:40 AM  Result Value Ref Range   WBC 10.3 4.0 - 10.5 K/uL   RBC 5.02 4.22 - 5.81 MIL/uL   Hemoglobin 14.3 13.0 - 17.0 g/dL   HCT 00.8 67.6 - 19.5 %   MCV 88.0 80.0 - 100.0 fL   MCH 28.5 26.0 - 34.0 pg   MCHC 32.4 30.0 - 36.0 g/dL   RDW 09.3 26.7 - 12.4 %   Platelets 262 150 - 400 K/uL   nRBC 0.0 0.0 - 0.2 %    Comment: Performed at [facility], [address] [place], [place] [address]  Glucose, capillary     Status: None   Collection Time: 12/30/19  8:23 AM  Result Value Ref Range   Glucose-Capillary 98 70 - 99 mg/dL    Comment: Glucose reference range applies only to samples taken after fasting for at least  8 hours.   Comment 1 Notify RN     Current Facility-Administered Medications  Medication Dose Route Frequency Provider Last Rate Last Admin  . acetaminophen (TYLENOL) 160 MG/5ML solution 650 mg  650 mg Oral Q6H PRN Nyoka Cowden, MD      . acetaminophen (TYLENOL) tablet 650 mg  650 mg Oral Q6H PRN Nyoka Cowden, MD   650 mg at 12/29/19 1634  . busPIRone (BUSPAR) tablet 5 mg  5 mg Oral TID Mannam, Praveen, MD   5 mg at 12/30/19 0832  . cefdinir (OMNICEF) capsule 300 mg  300 mg Oral Q12H Mannam, Praveen, MD   300 mg at 12/30/19 0833  . Chlorhexidine Gluconate Cloth 2 % PADS 6 each  6 each Topical Daily Lorin Glass, MD   6 each at 12/29/19 1746  . clonazePAM (KLONOPIN) disintegrating tablet 1 mg  1 mg Oral TID Nyoka Cowden, MD   1 mg at 12/30/19 5465  . cloNIDine  (CATAPRES) tablet 0.3 mg  0.3 mg Oral TID Mannam, Praveen, MD   0.3 mg at 12/30/19 0832  . dexmedetomidine (PRECEDEX) 400 MCG/100ML (4 mcg/mL) infusion  0.4-1.7 mcg/kg/hr Intravenous Titrated Karl Ito, MD 4.09 mL/hr at 12/30/19 0801 0.2 mcg/kg/hr at 12/30/19 0801  . enoxaparin (LOVENOX) injection 40 mg  40 mg Subcutaneous Q24H Helberg, Justin, MD   40 mg at 12/29/19 1635  . Eslicarbazepine Acetate TABS 1,200 mg  1,200 mg Oral Daily Nyoka Cowden, MD   1,200 mg at 12/29/19 0936  . feeding supplement (BOOST / RESOURCE BREEZE) liquid 1 Container  1 Container Oral TID BM Mannam, Praveen, MD   1 Container at 12/29/19 2125  . haloperidol lactate (HALDOL) injection 5 mg  5 mg Intravenous Q4H PRN Nyoka Cowden, MD   5 mg at 12/29/19 2148  . insulin aspart (novoLOG) injection 0-6 Units  0-6 Units Subcutaneous Q4H Helberg, Justin, MD      . ondansetron (ZOFRAN) injection 4 mg  4 mg Intravenous Q6H PRN Lorin Glass, MD      . QUEtiapine (SEROQUEL) tablet 25 mg  25 mg Oral BID Nyoka Cowden, MD   25 mg at 12/30/19 0354  . valproic acid (DEPAKENE) 250 MG capsule 1,000 mg  1,000 mg Oral BID Nyoka Cowden, MD   1,000 mg at 12/30/19 6568  . valproic acid (DEPAKENE) 250 MG capsule 500 mg  500 mg Oral q1600 Nyoka Cowden, MD   500 mg at 12/29/19 1634    Musculoskeletal: Strength & Muscle Tone: within normal limits Gait & Station: unable to assess Patient leans: N/A  Psychiatric Specialty Exam: Physical Exam  Nursing note and vitals reviewed. Constitutional: He is oriented to person, place, and time. He appears well-developed.  HENT:  Head: Normocephalic.  Cardiovascular: Normal rate.  Respiratory: Effort normal.  Musculoskeletal:     Cervical back: Normal range of motion.  Neurological: He is alert and oriented to person, place, and time.  Psychiatric: He has a normal mood and affect. His behavior is normal. Thought content normal. His speech is slurred. Cognition and memory are  normal. He expresses impulsivity.    Review of Systems  Constitutional: Negative.   HENT: Negative.   Eyes: Negative.   Respiratory: Negative.   Cardiovascular: Negative.   Gastrointestinal: Negative.   Genitourinary: Negative.   Musculoskeletal: Negative.   Skin: Negative.   Neurological: Negative.     Blood pressure (!) 143/97, pulse 73, temperature 99.6 F (37.6 C),  temperature source Oral, resp. rate (!) 29, height 6' (1.829 m), weight 83 kg, SpO2 100 %.Body mass index is 24.82 kg/m.  General Appearance: Fairly Groomed  Eye Contact:  Fair  Speech:  Slurred  Volume:  Normal  Mood:  Anxious  Affect:  Congruent  Thought Process:  Coherent, Goal Directed and Descriptions of Associations: Intact  Orientation:  Full (Time, Place, and Person)  Thought Content:  Logical  Suicidal Thoughts:  No  Homicidal Thoughts:  No  Memory:  Immediate;   Fair Recent;   Fair Remote;   Fair  Judgement:  Fair  Insight:  Fair  Psychomotor Activity:  Normal  Concentration:  Concentration: Good and Attention Span: Good  Recall:  Good  Fund of Knowledge:  Good  Language:  Good  Akathisia:  No  Handed:  Right  AIMS (if indicated):     Assets:  Communication Skills Desire for Improvement Financial Resources/Insurance Housing Intimacy Leisure Time Physical Health Resilience Social Support  ADL's:  Impaired  Cognition:  WNL  Sleep:        Treatment Plan Summary: Medication management recommend consider increase Seroquel at bedtime dose to 50 mg.  Disposition: No evidence of imminent risk to self or others at present.   Patient does not meet criteria for psychiatric inpatient admission. Supportive therapy provided about ongoing stressors.  Patrcia Dollyina L Tate, FNP 12/30/2019 10:23 AM

## 2019-12-30 NOTE — Progress Notes (Signed)
Rehab Admissions Coordinator Note:  Patient was screened by Clois Dupes for appropriateness for an Inpatient Acute Rehab Consult per PT recommendations. Status epilepticus.  Agitated At this time, we are recommending await further progress with therapies for unlikely to have a medical need for an inpatient rehab admission. I will follow.  Clois Dupes RN MSN 12/30/2019, 3:44 PM  I can be reached at [phone].

## 2019-12-30 NOTE — Progress Notes (Signed)
NAME:  Dylan Moon, MRN:  [id number], DOB:  1996-08-19, LOS: 6 ADMISSION DATE:  12/24/2019,  REFERRING MD:  EDP, CHIEF COMPLAINT:  Status epilepticus.  Brief History   Dylan Moon is a 24 y.o male with ADHD and epilepsy who presented to the emergency department with status epilepticus. He was subsequently intubated and admitted to the ICU for burst suppression. Mother states that he is compliant with all of his AEDs =  Depakote ER 9242 mg qhs and eslicarbazepine 683 mg tabs 1.5 tabs per day. The mother states that he typically has a seizure at least monthly. Possible etoh ingestion prior to admission.  Consults:  Neurology  Procedures:  Endotracheal intubation 4/2 >> 4/6  Significant Diagnostic Tests:  CT head 4/2:  1. No evidence of acute intracranial abnormality. 2. Left sphenoid sinus mucous retention cyst.  CT cervical spine 4/2:  1. No evidence of acute fracture to the cervical spine. 2. Nonspecific reversal of the expected cervical lordosis, which may be positional.  EEG 4/3 Abnormality - Sharp waves, left frontotemporal region - Continuous slow, generalized - Excessive beta, generalized  IMPRESSION: This study issuggestive of epileptogenicity in left frontotemporal region. Additionally, there is evidence of severe diffuse encephalopathy, nonspecific to etiology but most likely secondary to sedation.No seizures were seen throughout the recording.  Micro Data:  MRSA  PCR  4/2  Neg  Urine culture 4/1 Neg  Resp viral panel 4/2  Neg Blood cultures 4/2  >>> Resp culture 4/5 >>> staph >>> MSSA  Antimicrobials:  Zosyn  (? HCAP)  4/5 >>> 4/7 Omnicef 4/7>>>  Scheduled Meds: . busPIRone  5 mg Oral TID  . cefdinir  300 mg Oral Q12H  . Chlorhexidine Gluconate Cloth  6 each Topical Daily  . clonazepam  1 mg Oral TID  . cloNIDine  0.3 mg Oral TID  . enoxaparin (LOVENOX) injection  40 mg Subcutaneous Q24H  . Eslicarbazepine Acetate  1,200 mg Oral Daily  .  feeding supplement  1 Container Oral TID BM  . insulin aspart  0-6 Units Subcutaneous Q4H  . QUEtiapine  25 mg Oral BID  . valproic acid  1,000 mg Oral BID  . valproic acid  500 mg Oral q1600   Continuous Infusions: . dexmedetomidine (PRECEDEX) IV infusion 0.2 mcg/kg/hr (12/30/19 0801)   PRN Meds:.acetaminophen (TYLENOL) oral liquid 160 mg/5 mL, acetaminophen, fentaNYL (SUBLIMAZE) injection, haloperidol lactate, midazolam, ondansetron (ZOFRAN) IV   Interim history/subjective:  Extubated 4/6. No sig change overnight.  Remains easily agitated but improving slowly. Could not complete telemedicine psych eval yesterday. Wants chic-fil-a  Objective   Blood pressure (!) 134/93, pulse (!) 41, temperature 98.8 F (37.1 C), temperature source Axillary, resp. rate (!) 33, height 6' (1.829 m), weight 83 kg, SpO2 100 %.        Intake/Output Summary (Last 24 hours) at 12/30/2019 0908 Last data filed at 12/30/2019 0800 Gross per 24 hour  Intake 444.44 ml  Output 1200 ml  Net -755.56 ml   Filed Weights   12/28/19 0422 12/29/19 0311 12/30/19 0448  Weight: 81.5 kg 83.8 kg 83 kg    Examination: Gen:      No acute distress HEENT:  EOMI, sclera anicteric Neck:     No masses; no thyromegaly Lungs:   resps even non labored on RA CV:         Regular rate and rhythm; no murmurs Abd:      + bowel sounds; soft, non-tender; no palpable masses, no distension  Ext:    No edema; adequate peripheral perfusion Skin:      Warm and dry; no rash Neuro:  Awake, oriented to self and place, pleasantly confused at times. Wants to go home, wants some chic-fil-a    Labs significant for BUN/creatinine 14/1.47, WBC 9.6   Resolved Hospital Problem list     Assessment & Plan:    Agitation, delirium - improving slowly  Continue low dose precedex and wean off as able  Continue buspar (home med), seroquel  Consider addition klonopin  Psychiatry consult pending  PT/OT   Seizure disorder with concern for  breakthrough seizures No further seizures.  Discussed with neurology.  He is stable for discharge on his current medication regimen Follow-up with his epileptologist, neurologist at [facility].   Non-oliguric AKI Suspect secondary to urinary retention> good uop , creatinine improving  Patient is voiding spontaneously    Acute hypoxemic respiratory failure Secondary to clinical coma from breakthrough seizures Stable post extubation, on room air  Possible aspiration Pna  - Rx per flowsheet  - with mrsa screen neg this is likely MSSA Continue cefdinir for total 7-day course of antibiotics - stop date in place    Best practice:  Diet: Initiating clears. Pain/Anxiety/Delirium protocol (if indicated): NA VAP protocol (if indicated): NA DVT prophylaxis: lovenox GI prophylaxis: pepcid > DC Glucose control: controlled<180 Foley: No. Mobility: bedrest Code Status: full Family Communication: Updated mom Disposition: ICU    Dirk Dress, NP Pulmonary/Critical Care Medicine  12/30/2019  9:08 AM

## 2019-12-31 LAB — CBC
HCT: 44 % (ref 39.0–52.0)
Hemoglobin: 14.8 g/dL (ref 13.0–17.0)
MCH: 29.5 pg (ref 26.0–34.0)
MCHC: 33.6 g/dL (ref 30.0–36.0)
MCV: 87.6 fL (ref 80.0–100.0)
Platelets: 312 10*3/uL (ref 150–400)
RBC: 5.02 MIL/uL (ref 4.22–5.81)
RDW: 12.9 % (ref 11.5–15.5)
WBC: 8.2 10*3/uL (ref 4.0–10.5)
nRBC: 0 % (ref 0.0–0.2)

## 2019-12-31 LAB — BASIC METABOLIC PANEL
Anion gap: 15 (ref 5–15)
BUN: 17 mg/dL (ref 6–20)
CO2: 25 mmol/L (ref 22–32)
Calcium: 8.7 mg/dL — ABNORMAL LOW (ref 8.9–10.3)
Chloride: 100 mmol/L (ref 98–111)
Creatinine, Ser: 1.25 mg/dL — ABNORMAL HIGH (ref 0.61–1.24)
GFR calc Af Amer: >60 mL/min (ref >60)
GFR calc non Af Amer: >60 mL/min (ref >60)
Glucose, Bld: 126 mg/dL — ABNORMAL HIGH (ref 70–99)
Potassium: 3.3 mmol/L — ABNORMAL LOW (ref 3.5–5.1)
Sodium: 140 mmol/L (ref 135–145)

## 2019-12-31 LAB — GLUCOSE, CAPILLARY
Glucose-Capillary: 83 mg/dL (ref 70–99)
Glucose-Capillary: 116 mg/dL — ABNORMAL HIGH (ref 70–99)
Glucose-Capillary: 108 mg/dL — ABNORMAL HIGH (ref 70–99)

## 2019-12-31 LAB — VALPROIC ACID LEVEL: Valproic Acid Lvl: 83 ug/mL (ref 50.0–100.0)

## 2019-12-31 MED ORDER — HALOPERIDOL LACTATE 5 MG/ML IJ SOLN
INTRAMUSCULAR | Status: AC
  Filled 2019-12-31: qty 1

## 2019-12-31 MED ORDER — POTASSIUM CHLORIDE CRYS ER 20 MEQ PO TBCR
40.0000 meq | EXTENDED_RELEASE_TABLET | Freq: Once | ORAL | Status: AC
  Administered 2019-12-31: 11:00:00 40 meq via ORAL
  Filled 2019-12-31: qty 2

## 2019-12-31 MED ORDER — HALOPERIDOL LACTATE 5 MG/ML IJ SOLN
1.0000 mg | INTRAMUSCULAR | Status: DC | PRN
  Filled 2019-12-31 (×3): qty 1

## 2019-12-31 MED ORDER — DEXMEDETOMIDINE HCL IN NACL 400 MCG/100ML IV SOLN
0.4000 ug/kg/h | INTRAVENOUS | Status: DC
  Administered 2019-12-31: 0.5 ug/kg/h via INTRAVENOUS
  Filled 2019-12-31: qty 100

## 2019-12-31 NOTE — Progress Notes (Signed)
CSW received consult to IVC patient due to impulsivity and being unsafe to discharge at this time. CSW completing IVC paperwork.   Aiyannah Fayad LCSW

## 2019-12-31 NOTE — Progress Notes (Signed)
Responded to PIV consult. On arrival, noted PIV obtained by RN.

## 2019-12-31 NOTE — Progress Notes (Signed)
CSW attempted to see pt. To discuss back up SNF plan. Was not appropriate time as pt. Was attempting to leave AMA and was in discussion with multiple other staff members. Will follow up at later time.

## 2019-12-31 NOTE — Progress Notes (Signed)
CSW faxed IVC paperwork to magistrate. Magistrate sent custody order. Custody order placed in shadow chart. [employer] called for service only.

## 2019-12-31 NOTE — Progress Notes (Signed)
Pt found to be up out of bed. Redirected pt back into bed with one assistt. Explained to pt the importance of using call bell and not getting out of bed without assistance. Ensured bed alarm was on before leaving room.

## 2019-12-31 NOTE — Progress Notes (Signed)
Physical Therapy Treatment Patient Details Name: Dylan Moon MRN: [id number] DOB: 04/19/1996 Today's Date: 12/31/2019    History of Present Illness 24 y.o male with ADHD and epilepsy who presented to the emergency department with status epilepticus. He was subsequently intubated and admitted to the ICU for burst suppression. Mother states that he is compliant with all of his AEDs =  Depakote ER 3000 mg qhs and eslicarbazepine 800 mg tabs 1.5 tabs per day. The mother states that he typically has a seizure at least monthly. Possible etoh ingestion prior to admission.    PT Comments    Pt tolerates treatment well with improved demeanor and cognition this session (although still with slowed processing and safety awareness deficits). Pt with improved recognition of balance deficits and does have some recall of performance from 4/8 session. Pt continues to demonstrate balance deficits, although does not require physical assistance or UE support to correct them at this time. PT updating recommendations to home with no PT follow-up as PT anticipates pt gait and balance will continue to progress quickly with continued aggressive mobilization.   Follow Up Recommendations  No PT follow up;Supervision/Assistance - 24 hour     Equipment Recommendations  None recommended by PT    Recommendations for Other Services       Precautions / Restrictions Precautions Precautions: Fall(seizure) Restrictions Weight Bearing Restrictions: No    Mobility  Bed Mobility                  Transfers Overall transfer level: Needs assistance Equipment used: None Transfers: Sit to/from Stand Sit to Stand: Supervision            Ambulation/Gait Ambulation/Gait assistance: Supervision Gait Distance (Feet): 1000 Feet Assistive device: None Gait Pattern/deviations: Step-through pattern Gait velocity: functional Gait velocity interpretation: >2.62 ft/sec, indicative of community ambulatory General  Gait Details: pt with step through gait, intermittent drift left and right, 2 minor LOB which pt corrects for with stepping strategy.   Stairs             Wheelchair Mobility    Modified Rankin (Stroke Patients Only)       Balance Overall balance assessment: Needs assistance Sitting-balance support: No upper extremity supported;Feet supported Sitting balance-Leahy Scale: Good Sitting balance - Comments: supervision   Standing balance support: No upper extremity supported;During functional activity Standing balance-Leahy Scale: Good Standing balance comment: close supervision, adjusts Crocs straps in squat position                            Cognition Arousal/Alertness: Awake/alert Behavior During Therapy: WFL for tasks assessed/performed Overall Cognitive Status: Impaired/Different from baseline Area of Impairment: Attention;Memory;Following commands;Safety/judgement;Awareness;Problem solving                   Current Attention Level: Focused Memory: Decreased recall of precautions Following Commands: Follows one step commands consistently;Follows multi-step commands with increased time Safety/Judgement: Decreased awareness of safety;Decreased awareness of deficits Awareness: Emergent Problem Solving: Slow processing        Exercises      General Comments General comments (skin integrity, edema, etc.): pt tolerates session well, very agreeable to participate in PT session, excited to show PT his youtube page (has close to 3 million views)      Pertinent Vitals/Pain Pain Assessment: No/denies pain    Home Living  Prior Function            PT Goals (current goals can now be found in the care plan section) Acute Rehab PT Goals Patient Stated Goal: To go home Progress towards PT goals: Progressing toward goals    Frequency    Min 3X/week      PT Plan Discharge plan needs to be updated     Co-evaluation              AM-PAC PT "6 Clicks" Mobility   Outcome Measure  Help needed turning from your back to your side while in a flat bed without using bedrails?: None Help needed moving from lying on your back to sitting on the side of a flat bed without using bedrails?: None Help needed moving to and from a bed to a chair (including a wheelchair)?: None Help needed standing up from a chair using your arms (e.g., wheelchair or bedside chair)?: None Help needed to walk in hospital room?: None Help needed climbing 3-5 steps with a railing? : A Little 6 Click Score: 23    End of Session   Activity Tolerance: Patient tolerated treatment well Patient left: in chair;with call bell/phone within reach Nurse Communication: Mobility status PT Visit Diagnosis: Unsteadiness on feet (R26.81);Other symptoms and signs involving the nervous system (R29.898)     Time: 1610-9604 PT Time Calculation (min) (ACUTE ONLY): 12 min  Charges:  $Gait Training: 8-22 mins                     Zenaida Niece, PT, DPT Acute Rehabilitation Pager: [phone]    Zenaida Niece 12/31/2019, 3:59 PM

## 2019-12-31 NOTE — Progress Notes (Addendum)
Patient ID: Dylan Moon MRN: [id number] DOB/AGE: [birth year] 23 y.o.  Admit date: 12/24/2019 transfer date: 12/31/2019    ICU course    This is a 24 year old male admitted to [facility] on 4/2 w/ refractory break-through seizures and status epilepticus. Mother states compliant w/ meds. Does not drink alcohol but was out late the night before, arriving home ~ 1 am. Has h/o around 1 seizure a month.  Icu course- Intubated in ED 4/2 Admitted to Neuro ICU for burst-suppression therapy guided by LTM AEDs adjusted.  Hospital course complicated by: -MSSA PNA, initially treated w/ zosyn for presumed aspiration started 4/5 and narrowed to [facility] on 4/7 -AKI which resolved w/ IVFs -agitated delirium treated w/ Precedex, clonidine and clonazepam.   He was extubated on 4/7. Clinically stable w/out any recorded seizure since 4/6 -as of 4/9 no longer agitated but still delirious and not back to baseline. Working on titration of delirium meds. Transferred to medicine team    Significant Hospital tests/ studies  CT head 4/2:   1. No evidence of acute intracranial abnormality. 2. Left sphenoid sinus mucous retention cyst.   CT cervical spine 4/2:   1. No evidence of acute fracture to the cervical spine. 2. Nonspecific reversal of the expected cervical lordosis, which may be positional.   EEG 4/3 Abnormality - Sharp waves, left frontotemporal region - Continuous slow, generalized - Excessive beta, generalized   IMPRESSION: This study is suggestive of epileptogenicity in left frontotemporal region. Additionally, there is evidence of severe diffuse encephalopathy, nonspecific to etiology but most likely secondary to sedation. No seizures were seen throughout the recording.   Procedures    Culture data/antimicrobials     Micro Data:  MRSA  PCR  4/2  Neg  Urine culture 4/1 Neg  Resp viral panel 4/2  Neg Blood cultures 4/2  >>> Resp culture 4/5 >>> staph >>> MSSA     Antimicrobials:  Zosyn  (? HCAP)  4/5 >>> 4/7 Omnicef 4/7>>>    Consults  neuro     Subjective Wants to go home. Still impulsive   Objective Blood Pressure 98/84   Pulse 72   Temperature 99.4 F (37.4 C) (Oral)   Respiration 19   Height 6' (1.829 m)   Weight 83 kg   Oxygen Saturation 99%   Body Mass Index 24.82 kg/m    Intake/Output Summary (Last 24 hours) at 12/31/2019 0935 Last data filed at 12/31/2019 0800 Gross per 24 hour  Intake 2280.12 ml  Output 1500 ml  Net 780.12 ml    Exam  General awake. Alert still impulsive but easy to re-direct Neuro awake and oriented but still having some confusion and impulsiveness. No focal def Pulm clear to auscultation ->on room air  Card RRR   abd not tender  gu voids Ext no edema    Hospital dx   ADHD Seizure disorder Status epilepticus (resolved) Acute Hypoxic respiratory failure (resolved) MSSA pneumonia  Agitated Delirium/Toxic/metabolic encephalopathy AKI   Current dx  Seizure disorder Plan Cont valproic acid 1000-500-100 tid Cont Eslicarbazepine 1200 daily  Cont klonopin 1 mg tid Follow up primary neuro at [facility] 8-12 weeks  Acute toxic metabolic encephalopathy w/ agitated delirium Plan Cont supportive care Dc seroquel Cont Buspar Dc clonidine  MSSA PNA Plan 4 more days Omnicef   Mild hypokalemia Plan Replace  Diet: regular. Pain/Anxiety/Delirium protocol (if indicated): NA VAP protocol (if indicated): NA DVT prophylaxis: lovenox GI prophylaxis: pepcid > DC  Glucose control: controlled<180 Foley: No. Mobility: mobilize  Code Status: full Family Communication: Updated mom  Disposition-->to medical ward  Erick Colace ACNP-BC [place] Pager # [phone] OR # [phone] if no answer

## 2020-01-01 DIAGNOSIS — F909 Attention-deficit hyperactivity disorder, unspecified type: Secondary | ICD-10-CM

## 2020-01-01 LAB — CBC
HCT: 43.5 % (ref 39.0–52.0)
Hemoglobin: 14.4 g/dL (ref 13.0–17.0)
MCH: 29.1 pg (ref 26.0–34.0)
MCHC: 33.1 g/dL (ref 30.0–36.0)
MCV: 88.1 fL (ref 80.0–100.0)
Platelets: 368 10*3/uL (ref 150–400)
RBC: 4.94 MIL/uL (ref 4.22–5.81)
RDW: 13.1 % (ref 11.5–15.5)
WBC: 6.6 10*3/uL (ref 4.0–10.5)
nRBC: 0 % (ref 0.0–0.2)

## 2020-01-01 LAB — BASIC METABOLIC PANEL
Anion gap: 14 (ref 5–15)
BUN: 12 mg/dL (ref 6–20)
CO2: 23 mmol/L (ref 22–32)
Calcium: 8.9 mg/dL (ref 8.9–10.3)
Chloride: 104 mmol/L (ref 98–111)
Creatinine, Ser: 1.19 mg/dL (ref 0.61–1.24)
GFR calc Af Amer: >60 mL/min (ref >60)
GFR calc non Af Amer: >60 mL/min (ref >60)
Glucose, Bld: 92 mg/dL (ref 70–99)
Potassium: 3.4 mmol/L — ABNORMAL LOW (ref 3.5–5.1)
Sodium: 141 mmol/L (ref 135–145)

## 2020-01-01 MED ORDER — VALPROIC ACID 250 MG PO CAPS: 500.0000 mg | ORAL_CAPSULE | Freq: Every day | ORAL | 0 refills | Status: DC

## 2020-01-01 MED ORDER — VALPROIC ACID 250 MG PO CAPS: 1000.0000 mg | ORAL_CAPSULE | Freq: Two times a day (BID) | ORAL | 0 refills | Status: DC

## 2020-01-01 MED ORDER — POTASSIUM CHLORIDE CRYS ER 20 MEQ PO TBCR
40.0000 meq | EXTENDED_RELEASE_TABLET | Freq: Once | ORAL | Status: AC
  Administered 2020-01-01: 40 meq via ORAL
  Filled 2020-01-01: qty 2

## 2020-01-01 MED ORDER — APTIOM 800 MG PO TABS: 1200.0000 mg | ORAL_TABLET | Freq: Every day | ORAL | 0 refills | Status: DC

## 2020-01-01 MED ORDER — CEFDINIR 300 MG PO CAPS: 300.0000 mg | ORAL_CAPSULE | Freq: Two times a day (BID) | ORAL | 0 refills | Status: AC

## 2020-01-01 NOTE — Progress Notes (Signed)
Psych consult done facilitated by e-link using video call.

## 2020-01-01 NOTE — Progress Notes (Signed)
Long discussion made with pt's mother [name], niece and AC. Informed pt's family that pt continues to request to leave the hospital. Informed that Dr. Jannifer Franklin, from Psychiatry, has deemed the pt competent and able to make decisions for himself. Dr. Jannifer Franklin has also spoken to the pt's mother about his evaluation. Dr. Alvino Chapel, hospitalist, has also spoken with the pt's mother about Dr. Gloris Manchester evaluation.   [name] mother and niece are concerned for pt's safety when he leaves the hospital stating that he may be competent now, but could be bad again later. Lengthy conversation made to inform them of what the hospital can and can't do. Clarified with the pt's family that we cannot hold the pt against his will. [name] family continues to voice concern for allowing pt to leave the hospital. [facility] and RN reassured pt's family that pt will be encouraged to stay as advised by the medical team. Also agreed to closely monitor patient for cognitive changes and plan to have him reevaluated by psychiatry if any changes arise. But in the event that pt chooses to leave, pt's mother and niece informed that pt will not be held against his will as he is able to make competent decisions for himself. [name] family also assured that he has been on 1 on 1 monitoring with a sitter to monitor for any changes and maintain safety.

## 2020-01-01 NOTE — Progress Notes (Signed)
Assisted tele visit to patient with provider.  Hanae Waiters Ann, RN  

## 2020-01-01 NOTE — Consult Note (Signed)
[facility]iat[facility] Consult   Reason for Consult:  "for medical competency AND evaluation of underlying psychiatric disorder Impulsive, not cooperative.'' Referring Physician:  Dessa Phi, DO Patient Identification: Dylan Moon MRN:  [id number] Principal Diagnosis: Status epilepticus ([place]) Diagnosis:  Principal Problem:   Status epilepticus ([place]) Active Problems:   AKI (acute kidney injury) ([place])   Acute respiratory failure with hypoxemia ([place])   HCAP (healthcare-associated pneumonia)   Total Time spent with patient: 30 minutes  Subjective: " I am doing great, I am ready to go home.''  Objective:  24 year old male with past medical history significant for ADHD, epilepsy who was admitted 12/24/2019 due to refractory seizures, status epilepticus. Per chart review, patient was intubated on admission and extubated on 12/28/19. He was evaluated and cleared  by psychiatric service on 12/30/19. This consult is a request for medical competency, however, it should be noted that competency is always a court ordered and evaluation is conducted by a forensic psychiatrist. Rather, is assessed for capacity to make decision. Today, he is alert, oriented x4, denies depressive symptoms, psychosis, delusions, intellectual disability, SI/HI and based on my evaluation, has no evidence of dementia. He was able to tell the reasons for his admission and reports that he has been accepting all treatment plan except only one time yesterday when he refused his medication.   Past Psychiatric History: Cannabis use disorder, adjustment disorder with mixed disturbance of emotions and conduct  Risk to Self:  Denies Risk to Others:  Denies Prior Inpatient Therapy:  Denies Prior Outpatient Therapy:  Denies  Past Medical History:  Past Medical History:  Diagnosis Date  . ADHD   . Scoliosis   . Seizures ([place])    most recent 12/02/17    Past Surgical History:  Procedure Laterality Date  . NO PAST  SURGERIES     Family History:  Family History  Problem Relation Age of Onset  . Diabetes Mother   . Hypertension Mother   . Cancer Other   . Diabetes Father   . Seizures Maternal Grandfather    Family Psychiatric  History: Denies Social History:  Social History   Substance and Sexual Activity  Alcohol Use No     Social History   Substance and Sexual Activity  Drug Use Yes  . Types: Marijuana   Comment: Daily use of marijuana.    Social History   Socioeconomic History  . Marital status: Single    Spouse name: Not on file  . Number of children: 0  . Years of education: HS  . Highest education level: Not on file  Occupational History  . Occupation: Horticulturist, commercial  Tobacco Use  . Smoking status: Never Smoker  . Smokeless tobacco: Never Used  Substance and Sexual Activity  . Alcohol use: No  . Drug use: Yes    Types: Marijuana    Comment: Daily use of marijuana.  . Sexual activity: Not Currently  Other Topics Concern  . Not on file  Social History Narrative   Lives at home his mother.   3-4 sodas per week.   Right-handed.   Social Determinants of Health   Financial Resource Strain:   . Difficulty of Paying Living Expenses:   Food Insecurity:   . Worried About Charity fundraiser in the Last Year:   . Arboriculturist in the Last Year:   Transportation Needs:   . Film/video editor (Medical):   Marland Kitchen Lack of Transportation (Non-Medical):   Physical  Activity:   . Days of Exercise per Week:   . Minutes of Exercise per Session:   Stress:   . Feeling of Stress :   Social Connections:   . Frequency of Communication with Friends and Family:   . Frequency of Social Gatherings with Friends and Family:   . Attends Religious Services:   . Active Member of Clubs or Organizations:   . Attends Banker Meetings:   Marland Kitchen Marital Status:    Additional Social History:    Allergies:   Allergies  Allergen Reactions  . Keppra  [Levetiracetam] Other (See Comments)    Irritability   . Tramadol Other (See Comments)    Contraindicated with current medications (??)  . Vimpat [Lacosamide] Other (See Comments)    Causes anger    Labs:  Results for orders placed or performed during the hospital encounter of 12/24/19 (from the past 48 hour(s))  Glucose, capillary     Status: Abnormal   Collection Time: 12/30/19  3:16 PM  Result Value Ref Range   Glucose-Capillary 170 (H) 70 - 99 mg/dL    Comment: Glucose reference range applies only to samples taken after fasting for at least 8 hours.   Comment 1 Repeat Test    Comment 2 Document in Chart   Glucose, capillary     Status: None   Collection Time: 12/30/19  7:47 PM  Result Value Ref Range   Glucose-Capillary 98 70 - 99 mg/dL    Comment: Glucose reference range applies only to samples taken after fasting for at least 8 hours.  Glucose, capillary     Status: None   Collection Time: 12/30/19 11:16 PM  Result Value Ref Range   Glucose-Capillary 74 70 - 99 mg/dL    Comment: Glucose reference range applies only to samples taken after fasting for at least 8 hours.  Glucose, capillary     Status: None   Collection Time: 12/31/19  3:20 AM  Result Value Ref Range   Glucose-Capillary 83 70 - 99 mg/dL    Comment: Glucose reference range applies only to samples taken after fasting for at least 8 hours.  Basic metabolic panel     Status: Abnormal   Collection Time: 12/31/19  7:53 AM  Result Value Ref Range   Sodium 140 135 - 145 mmol/L   Potassium 3.3 (L) 3.5 - 5.1 mmol/L   Chloride 100 98 - 111 mmol/L   CO2 25 22 - 32 mmol/L   Glucose, Bld 126 (H) 70 - 99 mg/dL    Comment: Glucose reference range applies only to samples taken after fasting for at least 8 hours.   BUN 17 6 - 20 mg/dL   Creatinine, Ser 6.76 (H) 0.61 - 1.24 mg/dL   Calcium 8.7 (L) 8.9 - 10.3 mg/dL   GFR calc non Af Amer >60 >60 mL/min   GFR calc Af Amer >60 >60 mL/min   Anion gap 15 5 - 15    Comment:  Performed at [facility], [address], [place], [place] [address]  CBC     Status: None   Collection Time: 12/31/19  7:53 AM  Result Value Ref Range   WBC 8.2 4.0 - 10.5 K/uL   RBC 5.02 4.22 - 5.81 MIL/uL   Hemoglobin 14.8 13.0 - 17.0 g/dL   HCT 32.6 71.2 - 45.8 %   MCV 87.6 80.0 - 100.0 fL   MCH 29.5 26.0 - 34.0 pg   MCHC 33.6 30.0 -  36.0 g/dL   RDW 40.912.9 81.111.5 - 91.415.5 %   Platelets 312 150 - 400 K/uL   nRBC 0.0 0.0 - 0.2 %    Comment: Performed at [facility], [address] [place], [place] [address]  Valproic acid level     Status: None   Collection Time: 12/31/19  7:53 AM  Result Value Ref Range   Valproic Acid Lvl 83 50.0 - 100.0 ug/mL    Comment: Performed at [facility], [address] [place], [place] [address]  Glucose, capillary     Status: Abnormal   Collection Time: 12/31/19  8:18 AM  Result Value Ref Range   Glucose-Capillary 116 (H) 70 - 99 mg/dL    Comment: Glucose reference range applies only to samples taken after fasting for at least 8 hours.  Glucose, capillary     Status: Abnormal   Collection Time: 12/31/19 12:13 PM  Result Value Ref Range   Glucose-Capillary 108 (H) 70 - 99 mg/dL    Comment: Glucose reference range applies only to samples taken after fasting for at least 8 hours.  Basic metabolic panel     Status: Abnormal   Collection Time: 01/01/20  5:30 AM  Result Value Ref Range   Sodium 141 135 - 145 mmol/L   Potassium 3.4 (L) 3.5 - 5.1 mmol/L   Chloride 104 98 - 111 mmol/L   CO2 23 22 - 32 mmol/L   Glucose, Bld 92 70 - 99 mg/dL    Comment: Glucose reference range applies only to samples taken after fasting for at least 8 hours.   BUN 12 6 - 20 mg/dL   Creatinine, Ser 8.651.19 0.61 - 1.24 mg/dL   Calcium 8.9 8.9 - 78.410.3 mg/dL   GFR calc non Af Amer >60 >60 mL/min   GFR calc Af Amer >60 >60 mL/min   Anion gap 14 5 - 15    Comment: Performed at [facility], [address] [place], [place] [address]  CBC     Status: None    Collection Time: 01/01/20  5:30 AM  Result Value Ref Range   WBC 6.6 4.0 - 10.5 K/uL   RBC 4.94 4.22 - 5.81 MIL/uL   Hemoglobin 14.4 13.0 - 17.0 g/dL   HCT 52.843.5 41.339.0 - 24.452.0 %   MCV 88.1 80.0 - 100.0 fL   MCH 29.1 26.0 - 34.0 pg   MCHC 33.1 30.0 - 36.0 g/dL   RDW 01.013.1 27.211.5 - 53.615.5 %   Platelets 368 150 - 400 K/uL   nRBC 0.0 0.0 - 0.2 %    Comment: Performed at [facility], [address] [place], [place] [address]    Current Facility-Administered Medications  Medication Dose Route Frequency Provider Last Rate Last Admin  . acetaminophen (TYLENOL) tablet 650 mg  650 mg Oral Q6H PRN Nyoka CowdenWert, Michael B, MD   650 mg at 12/30/19 2112  . busPIRone (BUSPAR) tablet 5 mg  5 mg Oral TID Mannam, Praveen, MD   5 mg at 01/01/20 0925  . cefdinir (OMNICEF) capsule 300 mg  300 mg Oral Q12H Mannam, Praveen, MD   300 mg at 01/01/20 0926  . Chlorhexidine Gluconate Cloth 2 % PADS 6 each  6 each Topical Daily Lorin GlassSmith, Daniel C, MD   6 each at 01/01/20 1004  . clonazePAM (KLONOPIN) disintegrating tablet 1 mg  1 mg Oral TID Nyoka CowdenWert, Michael B, MD   1 mg at 01/01/20 0925  .  dexmedetomidine (PRECEDEX) 400 MCG/100ML (4 mcg/mL) infusion  0.4-1.2 mcg/kg/hr Intravenous Titrated Migdalia Dk, MD   Stopped at 01/01/20 0257  . enoxaparin (LOVENOX) injection 40 mg  40 mg Subcutaneous Q24H Helberg, Justin, MD   40 mg at 12/31/19 1647  . Eslicarbazepine Acetate TABS 1,200 mg  1,200 mg Oral Daily Sandrea Hughs B, MD   1,200 mg at 01/01/20 1003  . haloperidol lactate (HALDOL) injection 1-4 mg  1-4 mg Intravenous Q3H PRN Migdalia Dk, MD      . ondansetron (ZOFRAN) injection 4 mg  4 mg Intravenous Q6H PRN Lorin Glass, MD      . valproic acid (DEPAKENE) 250 MG capsule 1,000 mg  1,000 mg Oral BID Nyoka Cowden, MD   1,000 mg at 01/01/20 0925  . valproic acid (DEPAKENE) 250 MG capsule 500 mg  500 mg Oral q1600 Nyoka Cowden, MD   500 mg at 12/31/19 1656    Musculoskeletal: Strength & Muscle Tone: within  normal limits Gait & Station: unable to assess Patient leans: N/A  Psychiatric Specialty Exam: Physical Exam  Nursing note and vitals reviewed. Constitutional: He is oriented to person, place, and time. He appears well-developed.  HENT:  Head: Normocephalic.  Cardiovascular: Normal rate.  Respiratory: Effort normal.  Musculoskeletal:     Cervical back: Normal range of motion.  Neurological: He is alert and oriented to person, place, and time.  Psychiatric: He has a normal mood and affect. His speech is normal and behavior is normal. Judgment and thought content normal. Cognition and memory are normal.    Review of Systems  Constitutional: Negative.   HENT: Negative.   Eyes: Negative.   Respiratory: Negative.   Cardiovascular: Negative.   Gastrointestinal: Negative.   Genitourinary: Negative.   Musculoskeletal: Negative.   Skin: Negative.   Neurological: Negative.   Psychiatric/Behavioral: Negative.     Blood pressure (!) 163/70, pulse 73, temperature 98.9 F (37.2 C), temperature source Oral, resp. rate 17, height 6' (1.829 m), weight 83 kg, SpO2 96 %.Body mass index is 24.82 kg/m.  General Appearance: Fairly Groomed  Eye Contact:  Good  Speech:  Clear and Coherent  Volume:  Normal  Mood:  Anxious and Euthymic  Affect:  Appropriate  Thought Process:  Coherent, Goal Directed and Linear  Orientation:  Full (Time, Place, and Person)  Thought Content:  Logical  Suicidal Thoughts:  No  Homicidal Thoughts:  No  Memory:  Immediate;   Good Recent;   Good Remote;   Good  Judgement:  Intact  Insight:  Fair  Psychomotor Activity:  Normal  Concentration:  Concentration: Good and Attention Span: Good  Recall:  Good  Fund of Knowledge:  Good  Language:  Good  Akathisia:  No  Handed:  Right  AIMS (if indicated):     Assets:  Communication Skills Desire for Improvement Financial Resources/Insurance Housing Intimacy Leisure Time Physical Health Resilience Social  Support  ADL's:  Intact  Cognition:  WNL  Sleep:   normal     Treatment Plan Summary: 24 year old male with history of ADHD and Seizure disorder who was admitted due to status epilepticus but now stable. Patient is cooperative, pleasant,  denies depressive symptoms, mood swings, delusions or psychosis. Based on my evaluation today, patient has capacity to make medical decisions, however, capacity to make medical decision may change from time to time.  Recommendations: -If you want competency evaluation, you may have to contact family to seek a court mandated  competency evaluation by a forensic psychiatrist. -Refer to outpatient psychiatrist upon discharge for counseling and evaluation for medication management. -Psychiatric service is signing out. Re-consult as needed.  Disposition: No evidence of imminent risk to self or others at present.   Patient does not meet criteria for psychiatric inpatient admission. Supportive therapy provided about ongoing stressors.  Thedore Mins, MD 01/01/2020 12:51 PM

## 2020-01-01 NOTE — TOC Progression Note (Signed)
Transition of Care [facility]) - Progression Note    Patient Details  Name: Dylan Moon MRN: [id number] Date of Birth: Oct 12, 1995  Transition of [facility]) CM/SW Contact  Annalee Genta, LCSW Phone Number: 01/01/2020, 3:51 PM  Clinical Narrative:  CSW filed and scanned in IVC discontinuation paperwork signed by attending physician Dr. Alvino Chapel. CSW notes per documentation patient is no longer on involuntary commitment status. Original forms places with original IVC paperwork in patient's physical chart. CSW does not have access to change IVC status flag however, but patient's IVC has been discontinued as of 01/01/2020.      Expected Discharge Plan and Services                                                 Social Determinants of Health (SDOH) Interventions    Readmission Risk Interventions No flowsheet data found.

## 2020-01-01 NOTE — Progress Notes (Signed)
Pt stating throughout the morning that he wants to leave the hospital. He is currently A&Ox4, mildly agitated that he was told he may be able to go home today but now has been told he cannot. Pt continuously removing monitor leads, BP cuff, pulse ox. Requesting clothes and at this time fully dressed stating he is going home.  Pt requested a phone and one placed in room. He spoke with several friends and also called 911 who per patient told him he had the right to leave the hospital if he wanted to.   Pt mom and niece called primary RN and aggressively told RN that "this hospital would be responsible if he left the hospital" and demanded that he be transferred to [facility] RN several times that if this didn't happen we would be held responsible. RN attempted to explain process for hospital-hospital transfer but family was argumentative and RN told family MD would give them a call to discuss.  Pt continued to insist he was leaving AMA. Due to question of medical competency, MD made decision to IVC pt overnight until a formal psychiatric evaluation could be completed. [employer] at bedside 1800 to let pt know he would have to stay in hospital overnight. 1:1 sitter at bedside redirecting pt. Pt still refusing to wear EKG leads but did allow for vitals to be taken.

## 2020-01-01 NOTE — Progress Notes (Signed)
Pt now wanting to leave. Dr. Alvino Chapel notified and MD came to speak with pt. Pt continues to want to leave even after advised to stay and after risks discussed with patient when he leaves against medical advice. When asked where he is going, pt states he is going to his girlfriend's house. Pt continues to be coherent and coherent and cooperative at this time. AC notified of situation.

## 2020-01-01 NOTE — Consult Note (Signed)
Called patient's mother at Dr. Teresa Coombs request. Mother had the impression that psychiatrist recommended for her son to e discharged home today based on her conversation with Dr. Alvino Chapel. However, she was informed that psychiatrist did not make such recommendation, he was only assessed [place] for capacity and not for discharge. He was deemed to have capacity based on our evaluation today. Besides, psychiatrist cannot recommend discharge for a patient admitted for status epilepticus and she verbalizes understanding. She is aware now that her son's primary doctor will determine when he is discharged from [address]  Thedore Mins, MD Attending psychiatrist.

## 2020-01-01 NOTE — Progress Notes (Signed)
Pt states that his ride is here to pick him up. Another attempt made by RN to encourage pt to stay to complete treatment. Pt still wants to leave against medical advice. Pt is coherent and appropriate at this time. Pt ambulated to lobby accompanied by RN and NT. [name] friend, Lollie Marrow, picked pt up from the main entrance. No s/s of distress at this time.

## 2020-01-01 NOTE — Progress Notes (Signed)
Another attempt made by RN to encourage pt to stay to receive and complete treatment. Pt states that he wants to "receive treatment at another hospital." When pt asked what treatment he wants from another facility so he could receive it at [facility], he states that he is "just done with this hospital" and wants to leave.    Pt has called friends to get a ride home. Marcie Bal, pt's friend, has agreed to pick up pt from hospital. RN spoke with this individual and verified. AC notified.   Pt continues to insist on leaving against medical advice. Pt still coherent and appropriate at this time. AMA paperwork signed and IV removed. Sitter remains with patient in the room at this time.

## 2020-01-01 NOTE — Progress Notes (Signed)
Pt became extremely agitated, pulling IV out and standing up on bed. Bolus of Precedix failed due to loss IV. Security arrived and assisted with getting pt back in bed, then restraints where applied.

## 2020-01-01 NOTE — Progress Notes (Signed)
eLink Physician-Brief Progress Note Patient Name: Dylan Moon DOB: 20-Dec-1995 MRN: [id number]   Date of Service  01/01/2020  HPI/Events of Note  Extreme agitation, threatening to leave AMA despite IVC order.  eICU Interventions  Precedex infusion, 5 point soft restraints, PRN Haldol.        Thomasene Lot Ogan 01/01/2020, 1:03 AM

## 2020-01-01 NOTE — Progress Notes (Signed)
PROGRESS NOTE    Dylan Moon  XBW:[id number] DOB: 08-Apr-1996 DOA: 12/24/2019 PCP: Claiborne Rigg, NP     Brief Narrative:  Dylan Moon is a 24 year old male with past medical history significant for ADHD, epilepsy.  Patient was admitted 12/24/2019 due to refractory seizures, status epilepticus.  Patient was intubated on admission for burst suppression.  He was extubated on 4/6.  He has had significant agitation, delirium requiring Precedex drip, clonidine, clonazepam, Haldol.  He has been IVC'd.  Transferred out of ICU on 4/10.  New events last 24 hours / Subjective: Required precedex overnight for agitation, has been off since around 2-3am. This morning, pt evaluated with sitter at bedside.  Patient states that he was told he could go home today, which is false.  Patient was involuntarily committed yesterday.  He states that he has been able to walk with physical therapy yesterday, wants to walk today as well.  Assessment & Plan:   Active Problems:   AKI (acute kidney injury) (HCC)   Status epilepticus (HCC)   Acute respiratory failure with hypoxemia (HCC)   HCAP (healthcare-associated pneumonia)    Status epilepticus with underlying seizure disorder -Intubated for airway protection, extubated 4/6 -Continue valproic acid, eslicarbazepine  -Follow-up with primary neurology at [facility] in 8 to 12 weeks.  Case was discussed by ICU team with his epileptologist Dr.Robles   Acute toxic metabolic encephalopathy with intermittent delirium -Psych consulted -Continue BuSpar, Klonopin, haldol   MSSA pneumonia -Zosyn 4/5 --> Omnicef 4/7 - 4/11   Hypokalemia -Replace, trend  AKI -Resolved    DVT prophylaxis: Lovenox Code Status: Full Family Communication: None at bedside Disposition Plan:  . Patient is from home prior to admission. . Currently in-hospital treatment needed due to continued delirium and confusion. Patient IVC'd. . Suspect patient will discharge to  home in 1-2 days.    Consultants:   PCCM admission  Neurology  Psychiatry   Antimicrobials:  Anti-infectives (From admission, onward)   Start     Dose/Rate Route Frequency Ordered Stop   12/29/19 1500  cefdinir (OMNICEF) capsule 300 mg     300 mg Oral Every 12 hours 12/29/19 0912 01/03/20 0959   12/27/19 2100  piperacillin-tazobactam (ZOSYN) IVPB 3.375 g  Status:  Discontinued     3.375 g 12.5 mL/hr over 240 Minutes Intravenous Every 8 hours 12/27/19 1226 12/29/19 0912   12/27/19 1230  piperacillin-tazobactam (ZOSYN) IVPB 3.375 g     3.375 g 100 mL/hr over 30 Minutes Intravenous  Once 12/27/19 1226 12/27/19 1355        Objective: Vitals:   01/01/20 0600 01/01/20 0700 01/01/20 0800 01/01/20 0900  BP: (!) 157/94 (!) 149/80 (!) 160/81 (!) 158/71  Pulse:  89 85 79  Resp: 18   17  Temp:   99.3 F (37.4 C)   TempSrc:   Oral   SpO2:  98% 93% 95%  Weight:      Height:        Intake/Output Summary (Last 24 hours) at 01/01/2020 0944 Last data filed at 01/01/2020 0600 Gross per 24 hour  Intake 749.48 ml  Output 150 ml  Net 599.48 ml   Filed Weights   12/28/19 0422 12/29/19 0311 12/30/19 0448  Weight: 81.5 kg 83.8 kg 83 kg    Examination:  General exam: Appears comfortable  Respiratory system: Clear to auscultation. Respiratory effort normal. No respiratory distress. No conversational dyspnea.  Cardiovascular system: S1 & S2 heard, RRR. No murmurs. No pedal  edema. Gastrointestinal system: Abdomen is nondistended, soft and nontender. Normal bowel sounds heard. Central nervous system: Alert Extremities: Symmetric in appearance  Skin: No rashes, lesions or ulcers on exposed skin  Psychiatry: Judgement and insight appear poor    Data Reviewed: I have personally reviewed following labs and imaging studies  CBC: Recent Labs  Lab 12/28/19 0616 12/29/19 0047 12/30/19 0640 12/31/19 0753 01/01/20 0530  WBC 8.9 9.6 10.3 8.2 6.6  HGB 12.8* 13.6 14.3 14.8 14.4  HCT  39.1 42.1 44.2 44.0 43.5  MCV 87.7 88.8 88.0 87.6 88.1  PLT 150 185 262 312 368   Basic Metabolic Panel: Recent Labs  Lab 12/25/19 0954 12/25/19 1626 12/26/19 0536 12/26/19 1005 12/26/19 1626 12/27/19 0218 12/28/19 0616 12/29/19 0047 12/30/19 0640 12/31/19 0753 01/01/20 0530  NA  --   --   --    < >  --    < > 145 142 141 140 141  K  --   --   --    < >  --    < > 3.5 4.1 3.3* 3.3* 3.4*  CL  --   --   --    < >  --    < > 110 107 103 100 104  CO2  --   --   --    < >  --    < > 24 25 27 25 23   GLUCOSE  --   --   --    < >  --    < > 104* 97 92 126* 92  BUN  --   --   --    < >  --    < > 18 14 18 17 12   CREATININE  --   --   --    < >  --    < > 1.50* 1.47* 1.36* 1.25* 1.19  CALCIUM  --   --   --    < >  --    < > 8.4* 8.6* 8.8* 8.7* 8.9  MG 2.4 2.4 2.3  --  2.1  --   --   --   --   --   --   PHOS 2.7 2.9 2.8  --  3.0  --   --   --   --   --   --    < > = values in this interval not displayed.   GFR: Estimated Creatinine Clearance: 105.1 mL/min (by C-G formula based on SCr of 1.19 mg/dL). Liver Function Tests: No results for input(s): AST, ALT, ALKPHOS, BILITOT, PROT, ALBUMIN in the last 168 hours. No results for input(s): LIPASE, AMYLASE in the last 168 hours. Recent Labs  Lab 12/27/19 1550  AMMONIA 29   Coagulation Profile: No results for input(s): INR, PROTIME in the last 168 hours. Cardiac Enzymes: No results for input(s): CKTOTAL, CKMB, CKMBINDEX, TROPONINI in the last 168 hours. BNP (last 3 results) No results for input(s): PROBNP in the last 8760 hours. HbA1C: No results for input(s): HGBA1C in the last 72 hours. CBG: Recent Labs  Lab 12/30/19 1947 12/30/19 2316 12/31/19 0320 12/31/19 0818 12/31/19 1213  GLUCAP 98 74 83 116* 108*   Lipid Profile: No results for input(s): CHOL, HDL, LDLCALC, TRIG, CHOLHDL, LDLDIRECT in the last 72 hours. Thyroid Function Tests: No results for input(s): TSH, T4TOTAL, FREET4, T3FREE, THYROIDAB in the last 72  hours. Anemia Panel: No results for input(s): VITAMINB12, FOLATE, FERRITIN, TIBC, IRON, RETICCTPCT in the last  72 hours. Sepsis Labs: No results for input(s): PROCALCITON, LATICACIDVEN in the last 168 hours.  Recent Results (from the past 240 hour(s))  Respiratory Panel by RT PCR (Flu A&B, Covid) - Nasopharyngeal Swab     Status: None   Collection Time: 12/24/19  3:23 PM   Specimen: Nasopharyngeal Swab  Result Value Ref Range Status   SARS Coronavirus 2 by RT PCR NEGATIVE NEGATIVE Final    Comment: (NOTE) SARS-CoV-2 target nucleic acids are NOT DETECTED. The SARS-CoV-2 RNA is generally detectable in upper respiratoy specimens during the acute phase of infection. The lowest concentration of SARS-CoV-2 viral copies this assay can detect is 131 copies/mL. A negative result does not preclude SARS-Cov-2 infection and should not be used as the sole basis for treatment or other patient management decisions. A negative result may occur with  improper specimen collection/handling, submission of specimen other than nasopharyngeal swab, presence of viral mutation(s) within the areas targeted by this assay, and inadequate number of viral copies (<131 copies/mL). A negative result must be combined with clinical observations, patient history, and epidemiological information. The expected result is Negative. Fact Sheet for Patients:  PinkCheek.be Fact Sheet for Healthcare Providers:  GravelBags.it This test is not yet ap proved or cleared by the [place] FDA and  has been authorized for detection and/or diagnosis of SARS-CoV-2 by FDA under an Emergency Use Authorization (EUA). This EUA will remain  in effect (meaning this test can be used) for the duration of the COVID-19 declaration under Section 564(b)(1) of the Act, 21 U.S.C. section 360bbb-3(b)(1), unless the authorization is terminated or revoked sooner.    Influenza A by PCR  NEGATIVE NEGATIVE Final   Influenza B by PCR NEGATIVE NEGATIVE Final    Comment: (NOTE) The Xpert Xpress SARS-CoV-2/FLU/RSV assay is intended as an aid in  the diagnosis of influenza from Nasopharyngeal swab specimens and  should not be used as a sole basis for treatment. Nasal washings and  aspirates are unacceptable for Xpert Xpress SARS-CoV-2/FLU/RSV  testing. Fact Sheet for Patients: PinkCheek.be Fact Sheet for Healthcare Providers: GravelBags.it This test is not yet approved or cleared by the [place] FDA and  has been authorized for detection and/or diagnosis of SARS-CoV-2 by  FDA under an Emergency Use Authorization (EUA). This EUA will remain  in effect (meaning this test can be used) for the duration of the  Covid-19 declaration under Section 564(b)(1) of the Act, 21  U.S.C. section 360bbb-3(b)(1), unless the authorization is  terminated or revoked. Performed at [facility], [place] [address] [place], [place] [address]   MRSA PCR Screening     Status: None   Collection Time: 12/24/19  4:30 PM   Specimen: Nasopharyngeal  Result Value Ref Range Status   MRSA by PCR NEGATIVE NEGATIVE Final    Comment:        The GeneXpert MRSA Assay (FDA approved for NASAL specimens only), is one component of a comprehensive MRSA colonization surveillance program. It is not intended to diagnose MRSA infection nor to guide or monitor treatment for MRSA infections. Performed at [facility], [place] [address], [place], [place] [address]   Culture, blood (routine x 2)     Status: None   Collection Time: 12/24/19  6:06 PM   Specimen: BLOOD  Result Value Ref Range Status   Specimen Description BLOOD LEFT ANTECUBITAL  Final   Special Requests   Final    BOTTLES DRAWN AEROBIC AND ANAEROBIC Blood Culture results may  not be optimal due to an inadequate volume of blood received in culture bottles   Culture   Final    NO  GROWTH 5 DAYS Performed at [facility], [address], [place], [place] [address]    Report Status 12/29/2019 FINAL  Final  Culture, blood (routine x 2)     Status: None   Collection Time: 12/24/19  6:14 PM   Specimen: BLOOD LEFT FOREARM  Result Value Ref Range Status   Specimen Description BLOOD LEFT FOREARM  Final   Special Requests   Final    BOTTLES DRAWN AEROBIC AND ANAEROBIC Blood Culture adequate volume   Culture   Final    NO GROWTH 5 DAYS Performed at [facility], [address], [place], [place] [address]    Report Status 12/29/2019 FINAL  Final  Culture, Urine     Status: None   Collection Time: 12/24/19  6:38 PM   Specimen: Urine, Random  Result Value Ref Range Status   Specimen Description URINE, RANDOM  Final   Special Requests NONE  Final   Culture   Final    NO GROWTH Performed at [facility], [address], [place], [place] [address]    Report Status 12/25/2019 FINAL  Final  Culture, respiratory (non-expectorated)     Status: None   Collection Time: 12/27/19 12:58 PM   Specimen: Tracheal Aspirate; Respiratory  Result Value Ref Range Status   Specimen Description TRACHEAL ASPIRATE  Final   Special Requests Normal  Final   Gram Stain   Final    FEW WBC PRESENT, PREDOMINANTLY PMN ABUNDANT GRAM NEGATIVE RODS ABUNDANT GRAM POSITIVE COCCI Performed at [facility], [address], [place], [place] [address]    Culture ABUNDANT STAPHYLOCOCCUS AUREUS  Final   Report Status 12/29/2019 FINAL  Final   Organism ID, Bacteria STAPHYLOCOCCUS AUREUS  Final      Susceptibility   Staphylococcus aureus - MIC*    CIPROFLOXACIN <=0.5 SENSITIVE Sensitive     ERYTHROMYCIN >=8 RESISTANT Resistant     GENTAMICIN <=0.5 SENSITIVE Sensitive     OXACILLIN <=0.25 SENSITIVE Sensitive     TETRACYCLINE <=1 SENSITIVE Sensitive     VANCOMYCIN 1 SENSITIVE Sensitive     TRIMETH/SULFA <=10 SENSITIVE Sensitive     CLINDAMYCIN RESISTANT Resistant     RIFAMPIN  <=0.5 SENSITIVE Sensitive     Inducible Clindamycin POSITIVE Resistant     * ABUNDANT STAPHYLOCOCCUS AUREUS      Radiology Studies: No results found.    Scheduled Meds: . busPIRone  5 mg Oral TID  . cefdinir  300 mg Oral Q12H  . Chlorhexidine Gluconate Cloth  6 each Topical Daily  . clonazepam  1 mg Oral TID  . enoxaparin (LOVENOX) injection  40 mg Subcutaneous Q24H  . Eslicarbazepine Acetate  1,200 mg Oral Daily  . valproic acid  1,000 mg Oral BID  . valproic acid  500 mg Oral q1600   Continuous Infusions: . dexmedetomidine (PRECEDEX) IV infusion Stopped (01/01/20 0257)     LOS: 8 days      Time spent: 40 minutes   Noralee Stain, DO [facility] 01/01/2020, 9:44 AM   Available via Epic secure chat 7am-7pm After these hours, please refer to coverage provider listed on [email or web address]

## 2020-01-01 NOTE — Progress Notes (Signed)
  PROGRESS NOTE  Evaluation from psych completed this afternoon. Patient deemed to have medical decision making capacity today. Patient is not medically ready for discharge home today. I also discussed this recommendation with mother and niece over the phone; they were both very angry and made threatening comments regarding discharge plan such as "If he leaves and something happens to him, then you'd better be prepared for the consequences". Again, I re-iterated that I do not plan to discharge him home today. They asked for a second opinion psych evaluation. I also discussed over the phone with Dr. Jannifer Franklin and asked him to give the mother a call, at her request.   I've also been in communication with bedside RN and [facility] regarding today's updates. They have also been in contact with mother and niece over the phone.   Patient determined to leave against medical advice today. I spoke with patient in person this afternoon with RN at bedside. Patient is coherent, calm, smiling. He states that he still wants to leave our hospital and will take his medications as prescribed. I re-iterated my concern that he is not medical ready for discharge home, but as he has capacity to make decisions, we cannot hold him against his will.   Noralee Stain, DO [facility] 01/01/2020, 1:49 PM  Available via Epic secure chat 7am-7pm After these hours, please refer to coverage provider listed on [email or web address]

## 2020-01-03 ENCOUNTER — Telehealth: Payer: Self-pay

## 2020-01-03 NOTE — Telephone Encounter (Signed)
Transition Care Management Follow-up Telephone Call Date of discharge and from where: 01/01/2020, [facility]  Left AMA  Attempted to contact the patient.   Calls placed to # [phone] and [phone], both numbers just ring without the option to leave a voicemail message.   Call placed to patient's mother and the patient was with her.  They both confirmed that he has a provider at [facility] for PCP who he will be following up with.   Ms Meredeth Ide, NP removed from Epic as his PCP.

## 2020-01-04 NOTE — Discharge Summary (Signed)
Physician Discharge Summary  Dylan Moon QMV:[id number] DOB: Feb 10, 1996 DOA: 12/24/2019  PCP: No primary care provider on file.  Admit date: 12/24/2019 Discharge date: 01/04/2020  Signed out AMA   Brief/Interim Summary: Dylan Moon is a 24 year old male with past medical history significant for ADHD, epilepsy.  Patient was admitted 12/24/2019 due to refractory seizures, status epilepticus.  Patient was intubated on admission for burst suppression.  He was extubated on 4/6.  He has had significant agitation, delirium requiring Precedex drip, clonidine, clonazepam, Haldol.  He had been IVC'd.  Transferred out of ICU on 4/10. He was re-evaluated by psychiatry on 4/10 and was deemed to have medical decision making capacity. IVD discontinued. Patient encouraged to stay in hospital for further care and treatment numerous times by multiple providers but he continued to refuse. He remained alert, coherent, without delirium and signed out against medical advice.   Discharge Diagnoses:  Principal Problem:   Status epilepticus (HCC) Active Problems:   AKI (acute kidney injury) (HCC)   Acute respiratory failure with hypoxemia (HCC)   HCAP (healthcare-associated pneumonia)   Status epilepticus with underlying seizure disorder -Intubated for airway protection, extubated 4/6 -Continue valproic acid, eslicarbazepine  -Follow-up with primary neurology at [facility] in 8 to 12 weeks.  Case was discussed by ICU team with his epileptologist Dr.Robles   Acute toxic metabolic encephalopathy with intermittent delirium -Psych consulted -Continue BuSpar, Klonopin, haldol   MSSA pneumonia -Zosyn 4/5 --> Omnicef 4/7 - 4/11   Hypokalemia -Replace, trend  AKI -Resolved  Discharge Instructions   Allergies as of 01/01/2020      Reactions   Keppra [levetiracetam] Other (See Comments)   Irritability   Tramadol Other (See Comments)   Contraindicated with current medications (??)   Vimpat  [lacosamide] Other (See Comments)   Causes anger      Medication List    STOP taking these medications   divalproex 500 MG 24 hr tablet Commonly known as: Depakote ER     TAKE these medications   Aptiom 800 MG Tabs Generic drug: Eslicarbazepine Acetate Take 1,200 mg by mouth daily. Taking 1.5 tablets = 1200mg    busPIRone 5 MG tablet Commonly known as: BUSPAR Take 5 mg by mouth 3 (three) times daily. Taking 1 tablet daily   diclofenac Sodium 1 % Gel Commonly known as: VOLTAREN Apply 4 g topically 4 (four) times daily as needed for pain.   valproic acid 250 MG capsule Commonly known as: DEPAKENE Take 4 capsules (1,000 mg total) by mouth 2 (two) times daily.   valproic acid 250 MG capsule Commonly known as: DEPAKENE Take 2 capsules (500 mg total) by mouth daily at 4 PM.     ASK your doctor about these medications   cefdinir 300 MG capsule Commonly known as: OMNICEF Take 1 capsule (300 mg total) by mouth every 12 (twelve) hours for 1 day. Ask about: Should I take this medication?       Allergies  Allergen Reactions  . Keppra [Levetiracetam] Other (See Comments)    Irritability   . Tramadol Other (See Comments)    Contraindicated with current medications (??)  . Vimpat [Lacosamide] Other (See Comments)    Causes anger    Consultations:  PCCM admission  Neurology  Psychiatry   Procedures/Studies: EEG  Result Date: 12/24/2019 02/23/2020, MD     12/24/2019  1:11 PM Patient Name: Dylan Moon MRN: Gabriel Carina Epilepsy Attending: [id number] Referring Physician/Provider: Dr. Charlsie Quest Date: 12/24/2019  Duration: 25.68 mins Patient history: 24 year old male with history of epilepsy presented in status epilepticus.  EEG to evaluate for subclinical seizures. Level of alertness: Comatose/sedated AEDs during EEG study: Depakote, Versed, propofol Technical aspects: This EEG study was done with scalp electrodes positioned according to the 10-20 International  system of electrode placement. Electrical activity was acquired at a sampling rate of  and reviewed with a high frequency filter of  and a low frequency filter of . EEG data were recorded continuously and digitally stored. Description: EEG showed continuous generalized 2 to 3 Hz delta activity with overriding 15 to 18 Hz, 2-3 uV beta activity distributed symmetrically and diffusely.  EEG was reactive to tactile stimulation. Hyperventilation and photic stimulation were not performed. Abnormality -Continuous slow, generalized -Excessive beta, generalized IMPRESSION: This study is suggestive of severe diffuse encephalopathy, nonspecific to etiology but most likely secondary to sedation. The excessive beta activity seen in the background is most likely due to the effect of benzodiazepine and is a benign EEG pattern. No seizures or epileptiform discharges were seen throughout the recording. Dr Otelia Limes was notified. Charlsie Quest   DG Abd 1 View  Result Date: 12/26/2019 CLINICAL DATA:  OG tube placement EXAM: ABDOMEN - 1 VIEW COMPARISON:  None. FINDINGS: Esophagogastric tube is positioned with tip and side port above the diaphragm. Nonobstructive pattern included bowel gas. IMPRESSION: Esophagogastric tube is positioned with tip and side port above the diaphragm. Recommend advancement to ensure subdiaphragmatic position. Electronically Signed   By: Lauralyn Primes M.D.   On: 12/26/2019 17:38   CT Head Wo Contrast  Result Date: 12/24/2019 CLINICAL DATA:  Head trauma, moderate/severe. Additional history provided: Patient brought in by ambulance from home with seizures, 2 witnessed tonic-clonic seizures with EMS, patient unresponsive, fall. EXAM: CT HEAD WITHOUT CONTRAST CT CERVICAL SPINE WITHOUT CONTRAST TECHNIQUE: Multidetector CT imaging of the head and cervical spine was performed following the standard protocol without intravenous contrast. Multiplanar CT image reconstructions of the cervical spine  were also generated. COMPARISON:  CT head 01/15/2019, CT head/maxillofacial/cervical spine 04/23/2018 FINDINGS: CT HEAD FINDINGS Brain: There is no evidence of acute intracranial hemorrhage, intracranial mass, midline shift or extra-axial fluid collection.No demarcated cortical infarction. Vascular: No hyperdense vessel. Skull: Normal. Negative for fracture or focal lesion. Sinuses/Orbits: Visualized orbits demonstrate no acute abnormality. Left sphenoid sinus mucous retention cyst. No significant mastoid effusion. CT CERVICAL SPINE FINDINGS Alignment: Nonspecific reversal of the expected cervical lordosis. No significant spondylolisthesis. Skull base and vertebrae: The basion-dental and atlanto-dental intervals are maintained.No evidence of acute fracture to the cervical spine. Soft tissues and spinal canal: No prevertebral fluid or swelling. No visible canal hematoma. Partially visualized support tubes. Disc levels: No significant bony spinal canal or neural foraminal narrowing at any level. Upper chest: No consolidation within the imaged lung apices. No visible pneumothorax. IMPRESSION: CT head: 1. No evidence of acute intracranial abnormality. 2. Left sphenoid sinus mucous retention cyst. CT cervical spine: 1. No evidence of acute fracture to the cervical spine. 2. Nonspecific reversal of the expected cervical lordosis, which may be positional. Electronically Signed   By: Jackey Loge DO   On: 12/24/2019 12:10   CT Cervical Spine Wo Contrast  Result Date: 12/24/2019 CLINICAL DATA:  Head trauma, moderate/severe. Additional history provided: Patient brought in by ambulance from home with seizures, 2 witnessed tonic-clonic seizures with EMS, patient unresponsive, fall. EXAM: CT HEAD WITHOUT CONTRAST CT CERVICAL SPINE WITHOUT CONTRAST TECHNIQUE: Multidetector CT imaging of the head and cervical spine was  performed following the standard protocol without intravenous contrast. Multiplanar CT image reconstructions  of the cervical spine were also generated. COMPARISON:  CT head 01/15/2019, CT head/maxillofacial/cervical spine 04/23/2018 FINDINGS: CT HEAD FINDINGS Brain: There is no evidence of acute intracranial hemorrhage, intracranial mass, midline shift or extra-axial fluid collection.No demarcated cortical infarction. Vascular: No hyperdense vessel. Skull: Normal. Negative for fracture or focal lesion. Sinuses/Orbits: Visualized orbits demonstrate no acute abnormality. Left sphenoid sinus mucous retention cyst. No significant mastoid effusion. CT CERVICAL SPINE FINDINGS Alignment: Nonspecific reversal of the expected cervical lordosis. No significant spondylolisthesis. Skull base and vertebrae: The basion-dental and atlanto-dental intervals are maintained.No evidence of acute fracture to the cervical spine. Soft tissues and spinal canal: No prevertebral fluid or swelling. No visible canal hematoma. Partially visualized support tubes. Disc levels: No significant bony spinal canal or neural foraminal narrowing at any level. Upper chest: No consolidation within the imaged lung apices. No visible pneumothorax. IMPRESSION: CT head: 1. No evidence of acute intracranial abnormality. 2. Left sphenoid sinus mucous retention cyst. CT cervical spine: 1. No evidence of acute fracture to the cervical spine. 2. Nonspecific reversal of the expected cervical lordosis, which may be positional. Electronically Signed   By: Jackey Loge DO   On: 12/24/2019 12:10   DG CHEST PORT 1 VIEW  Result Date: 12/27/2019 CLINICAL DATA:  24 year old male status post intubation. EXAM: PORTABLE CHEST 1 VIEW COMPARISON:  Chest radiograph dated 12/27/2019. FINDINGS: An endotracheal tube is noted with tip approximately 6 cm above the carina. Enteric tube extends below the diaphragm with tip beyond the inferior margin of the image. Patchy area of airspace opacity involving the right lower lung field concerning for infiltrate. Clinical correlation is  recommended. The left lung remains clear. No pneumothorax. The cardiac silhouette is within limits. No acute osseous pathology. IMPRESSION: 1. Endotracheal tube above the carina. 2. Right lung base airspace opacity concerning for pneumonia. Electronically Signed   By: Elgie Collard M.D.   On: 12/27/2019 20:10   DG Chest Port 1 View  Result Date: 12/27/2019 CLINICAL DATA:  Respiratory failure EXAM: PORTABLE CHEST 1 VIEW COMPARISON:  12/25/2019 FINDINGS: Endotracheal tube with the tip 4 cm above the carina. Nasogastric tube projecting over the stomach. Small layering right pleural effusion. Hazy right basilar airspace disease likely reflecting atelectasis. Small area of lucency peripherally along the right costophrenic angle likely projectional versus less likely a small pneumothorax. Attention on follow-up examination recommended. Otherwise no pneumothorax. Stable cardiomediastinal silhouette. No aggressive osseous lesion. IMPRESSION: Support lines and tubing in satisfactory position. Small area of lucency peripherally along the right costophrenic angle likely projectional versus less likely a small pneumothorax. Attention on follow-up examination recommended. Mild right basilar pleuroparenchymal disease. Electronically Signed   By: Elige Ko   On: 12/27/2019 12:06   DG Chest Port 1 View  Result Date: 12/25/2019 CLINICAL DATA:  Hypoxia EXAM: PORTABLE CHEST 1 VIEW COMPARISON:  December 24, 2019 FINDINGS: Endotracheal tube tip is 6.4 cm above the carina. Nasogastric tube tip and side port are below the diaphragm. No pneumothorax. There is a small layering pleural effusion on the right. Lungs elsewhere are clear. Heart size and pulmonary vascularity are normal. No adenopathy no bone lesions. IMPRESSION: Tube positions as described without pneumothorax. Small pleural effusion on the right. Lungs elsewhere clear. Cardiac silhouette within normal limits. Electronically Signed   By: Bretta Bang III M.D.   On:  12/25/2019 08:09   DG Chest Portable 1 View  Result Date: 12/24/2019 CLINICAL DATA:  Hypoxia EXAM: PORTABLE CHEST 1 VIEW COMPARISON:  October 15, 2008 FINDINGS: Endotracheal tube tip is 4.8 cm above the carina. Nasogastric tube tip and side port are in the stomach. No pneumothorax. The lungs are clear. The heart size and pulmonary vascularity are normal. No adenopathy. No bone lesions. IMPRESSION: Tube positions as described without pneumothorax. Lungs clear. Cardiac silhouette within normal limits. Electronically Signed   By: Bretta Bang III M.D.   On: 12/24/2019 11:23   Overnight EEG with video  Result Date: 12/25/2019 Charlsie Quest, MD     12/26/2019  9:36 AM Patient Name: Dylan Moon MRN: [id number] Epilepsy Attending: Charlsie Quest Referring Physician/Provider: Dr. Caryl Pina Duration: 12/24/2019 1310 to 12/25/2019 1310  Patient history: 24 year old male with history of epilepsy presented in status epilepticus. EEG to evaluate for subclinical seizures.  Level of alertness: Comatose/sedated  AEDs during EEG study: Depakote, Versed  Technical aspects: This EEG study was done with scalp electrodes positioned according to the 10-20 International system of electrode placement. Electrical activity was acquired at a sampling rate of 500Hz  and reviewed with a high frequency filter of 70Hz  and a low frequency filter of 1Hz . EEG data were recorded continuously and digitally stored.  Description: EEG showed continuous generalized 2 to 3 Hz delta activity with overriding 15 to 18 Hz, 2-3 uV beta activity distributed symmetrically and diffusely. Sharp waves were noted in left frontotemporal region as well as sharp transients in right frontotemporal region. EEG was reactive to tactile stimulation. Hyperventilation and photic stimulation were not performed. Event button was pressed on 12/25/2019 at 0849 and 1235. Patient was noted to have axial stiffening. Concomitant EEG before, during and after the  event didn't show any eeg change to suggest seizure.  Abnormality - Sharp waves, left frontotemporal region - Continuous slow, generalized - Excessive beta, generalized  IMPRESSION: This study is suggestive of epileptogenicity in left frontotemporal region. Additionally, there is evidence of severe diffuse encephalopathy, nonspecific to etiology but most likely secondary to sedation. No seizures were seen throughout the recording. Event button was pressed on 12/25/2019 as described above without eeg change and therefore not epileptic.   02/24/2020 EKG SITE RITE  Result Date: 12/25/2019 If Site Rite image not attached, placement could not be confirmed due to current cardiac rhythm.       Discharge Exam: Vitals:   01/01/20 0900 01/01/20 1200  BP: (!) 158/71 (!) 163/70  Pulse: 79 73  Resp: 17   Temp:  98.9 F (37.2 C)  SpO2: 95% 96%     The results of significant diagnostics from this hospitalization (including imaging, microbiology, ancillary and laboratory) are listed below for reference.     Microbiology: Recent Results (from the past 240 hour(s))  Culture, respiratory (non-expectorated)     Status: None   Collection Time: 12/27/19 12:58 PM   Specimen: Tracheal Aspirate; Respiratory  Result Value Ref Range Status   Specimen Description TRACHEAL ASPIRATE  Final   Special Requests Normal  Final   Gram Stain   Final    FEW WBC PRESENT, PREDOMINANTLY PMN ABUNDANT GRAM NEGATIVE RODS ABUNDANT GRAM POSITIVE COCCI Performed at [facility], [address] [place], [facility] [address]    Culture ABUNDANT STAPHYLOCOCCUS AUREUS  Final   Report Status 12/29/2019 FINAL  Final   Organism ID, Bacteria STAPHYLOCOCCUS AUREUS  Final      Susceptibility   Staphylococcus aureus - MIC*    CIPROFLOXACIN <=0.5 SENSITIVE Sensitive  ERYTHROMYCIN >=8 RESISTANT Resistant     GENTAMICIN <=0.5 SENSITIVE Sensitive     OXACILLIN <=0.25 SENSITIVE Sensitive     TETRACYCLINE <=1  SENSITIVE Sensitive     VANCOMYCIN 1 SENSITIVE Sensitive     TRIMETH/SULFA <=10 SENSITIVE Sensitive     CLINDAMYCIN RESISTANT Resistant     RIFAMPIN <=0.5 SENSITIVE Sensitive     Inducible Clindamycin POSITIVE Resistant     * ABUNDANT STAPHYLOCOCCUS AUREUS     Labs: BNP (last 3 results) Recent Labs    12/24/19 1046  BNP 44.9   Basic Metabolic Panel: Recent Labs  Lab 12/29/19 0047 12/30/19 0640 12/31/19 0753 01/01/20 0530  NA 142 141 140 141  K 4.1 3.3* 3.3* 3.4*  CL 107 103 100 104  CO2 25 27 25 23   GLUCOSE 97 92 126* 92  BUN 14 18 17 12   CREATININE 1.47* 1.36* 1.25* 1.19  CALCIUM 8.6* 8.8* 8.7* 8.9   Liver Function Tests: No results for input(s): AST, ALT, ALKPHOS, BILITOT, PROT, ALBUMIN in the last 168 hours. No results for input(s): LIPASE, AMYLASE in the last 168 hours. No results for input(s): AMMONIA in the last 168 hours. CBC: Recent Labs  Lab 12/29/19 0047 12/30/19 0640 12/31/19 0753 01/01/20 0530  WBC 9.6 10.3 8.2 6.6  HGB 13.6 14.3 14.8 14.4  HCT 42.1 44.2 44.0 43.5  MCV 88.8 88.0 87.6 88.1  PLT 185 262 312 368   Cardiac Enzymes: No results for input(s): CKTOTAL, CKMB, CKMBINDEX, TROPONINI in the last 168 hours. BNP: Invalid input(s): POCBNP CBG: Recent Labs  Lab 12/30/19 1947 12/30/19 2316 12/31/19 0320 12/31/19 0818 12/31/19 1213  GLUCAP 98 74 83 116* 108*   D-Dimer No results for input(s): DDIMER in the last 72 hours. Hgb A1c No results for input(s): HGBA1C in the last 72 hours. Lipid Profile No results for input(s): CHOL, HDL, LDLCALC, TRIG, CHOLHDL, LDLDIRECT in the last 72 hours. Thyroid function studies No results for input(s): TSH, T4TOTAL, T3FREE, THYROIDAB in the last 72 hours.  Invalid input(s): FREET3 Anemia work up No results for input(s): VITAMINB12, FOLATE, FERRITIN, TIBC, IRON, RETICCTPCT in the last 72 hours. Urinalysis    Component Value Date/Time   COLORURINE YELLOW 12/25/2019 1101   APPEARANCEUR CLOUDY (A)  12/25/2019 1101   LABSPEC 1.016 12/25/2019 1101   PHURINE 5.0 12/25/2019 1101   GLUCOSEU NEGATIVE 12/25/2019 1101   HGBUR MODERATE (A) 12/25/2019 1101   BILIRUBINUR NEGATIVE 12/25/2019 1101   KETONESUR NEGATIVE 12/25/2019 1101   PROTEINUR 30 (A) 12/25/2019 1101   NITRITE NEGATIVE 12/25/2019 1101   LEUKOCYTESUR NEGATIVE 12/25/2019 1101   Sepsis Labs Invalid input(s): PROCALCITONIN,  WBC,  LACTICIDVEN Microbiology Recent Results (from the past 240 hour(s))  Culture, respiratory (non-expectorated)     Status: None   Collection Time: 12/27/19 12:58 PM   Specimen: Tracheal Aspirate; Respiratory  Result Value Ref Range Status   Specimen Description TRACHEAL ASPIRATE  Final   Special Requests Normal  Final   Gram Stain   Final    FEW WBC PRESENT, PREDOMINANTLY PMN ABUNDANT GRAM NEGATIVE RODS ABUNDANT GRAM POSITIVE COCCI Performed at [facility], [place] [address] [place], [place] [address]    Culture ABUNDANT STAPHYLOCOCCUS AUREUS  Final   Report Status 12/29/2019 FINAL  Final   Organism ID, Bacteria STAPHYLOCOCCUS AUREUS  Final      Susceptibility   Staphylococcus aureus - MIC*    CIPROFLOXACIN <=0.5 SENSITIVE Sensitive     ERYTHROMYCIN >=8 RESISTANT Resistant  GENTAMICIN <=0.5 SENSITIVE Sensitive     OXACILLIN <=0.25 SENSITIVE Sensitive     TETRACYCLINE <=1 SENSITIVE Sensitive     VANCOMYCIN 1 SENSITIVE Sensitive     TRIMETH/SULFA <=10 SENSITIVE Sensitive     CLINDAMYCIN RESISTANT Resistant     RIFAMPIN <=0.5 SENSITIVE Sensitive     Inducible Clindamycin POSITIVE Resistant     * ABUNDANT STAPHYLOCOCCUS AUREUS     SIGNED:  Noralee StainJennifer Cassundra Mckeever, DO [facility] 01/04/2020, 9:18 AM

## 2020-02-21 ENCOUNTER — Other Ambulatory Visit: Payer: Self-pay

## 2020-02-21 ENCOUNTER — Emergency Department (HOSPITAL_COMMUNITY)
Admission: EM | Admit: 2020-02-21 | Discharge: 2020-02-21 | Disposition: A | Discharge status: Home / Self Care | Payer: Medicaid Other | Attending: Emergency Medicine | Admitting: Emergency Medicine

## 2020-02-21 ENCOUNTER — Encounter (HOSPITAL_COMMUNITY): Payer: Self-pay

## 2020-02-21 DIAGNOSIS — F909 Attention-deficit hyperactivity disorder, unspecified type: Secondary | ICD-10-CM | POA: Insufficient documentation

## 2020-02-21 DIAGNOSIS — Z79899 Other long term (current) drug therapy: Secondary | ICD-10-CM | POA: Diagnosis not present

## 2020-02-21 DIAGNOSIS — R569 Unspecified convulsions: Secondary | ICD-10-CM | POA: Insufficient documentation

## 2020-02-21 LAB — CBC
HCT: 54.9 % — ABNORMAL HIGH (ref 39.0–52.0)
Hemoglobin: 17.6 g/dL — ABNORMAL HIGH (ref 13.0–17.0)
MCH: 29.3 pg (ref 26.0–34.0)
MCHC: 32.1 g/dL (ref 30.0–36.0)
MCV: 91.3 fL (ref 80.0–100.0)
Platelets: 223 10*3/uL (ref 150–400)
RBC: 6.01 MIL/uL — ABNORMAL HIGH (ref 4.22–5.81)
RDW: 13.8 % (ref 11.5–15.5)
WBC: 11.6 10*3/uL — ABNORMAL HIGH (ref 4.0–10.5)
nRBC: 0 % (ref 0.0–0.2)

## 2020-02-21 LAB — COMPREHENSIVE METABOLIC PANEL
ALT: 39 U/L (ref 0–44)
AST: 36 U/L (ref 15–41)
Albumin: 4.8 g/dL (ref 3.5–5.0)
Alkaline Phosphatase: 37 U/L — ABNORMAL LOW (ref 38–126)
Anion gap: 15 (ref 5–15)
BUN: 13 mg/dL (ref 6–20)
CO2: 22 mmol/L (ref 22–32)
Calcium: 9.8 mg/dL (ref 8.9–10.3)
Chloride: 101 mmol/L (ref 98–111)
Creatinine, Ser: 1.37 mg/dL — ABNORMAL HIGH (ref 0.61–1.24)
GFR calc Af Amer: >60 mL/min (ref >60)
GFR calc non Af Amer: >60 mL/min (ref >60)
Glucose, Bld: 147 mg/dL — ABNORMAL HIGH (ref 70–99)
Potassium: 5.7 mmol/L — ABNORMAL HIGH (ref 3.5–5.1)
Sodium: 138 mmol/L (ref 135–145)
Total Bilirubin: 0.6 mg/dL (ref 0.3–1.2)
Total Protein: 7.8 g/dL (ref 6.5–8.1)

## 2020-02-21 LAB — POTASSIUM: Potassium: 4.9 mmol/L (ref 3.5–5.1)

## 2020-02-21 LAB — VALPROIC ACID LEVEL: Valproic Acid Lvl: 81 ug/mL (ref 50.0–100.0)

## 2020-02-21 MED ORDER — SODIUM CHLORIDE 0.9 % IV BOLUS
500.0000 mL | Freq: Once | INTRAVENOUS | Status: AC
  Administered 2020-02-21: 500 mL via INTRAVENOUS

## 2020-02-21 NOTE — ED Provider Notes (Signed)
[facility] EMERGENCY DEPARTMENT Provider Note   CSN: [id number] Arrival date & time: 02/21/20  1302     History Chief Complaint  Patient presents with  . Seizures    Dylan Moon is a 24 y.o. male. Level 5 caveat secondary to altered mental status HPI  24 year old male with a history of seizure disorder, on Depakote, who presents today with seizure.  EMS reported about 5 minutes of normal seizure activity with another 20 minutes of preferred gaze.  He then appeared to be postictal with some confusion. Reviewed discharge summary here from 01/04/2020.  During that admission he was intubated for airway protection due to status epilepticus.  He had acute encephalopathy and acute kidney injury as well as respiratory failure.  Patient was discharged on  Aptiom, buspirone, valproic acid was to follow-up at [facility].  Past Medical History:  Diagnosis Date  . ADHD   . Scoliosis   . Seizures (HCC)    most recent 12/02/17    Patient Active Problem List   Diagnosis Date Noted  . HCAP (healthcare-associated pneumonia) 12/27/2019  . Status epilepticus (HCC) 12/24/2019  . Acute respiratory failure with hypoxemia (HCC)   . Seizures (HCC) 07/10/2018  . 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 Noncompliance with medication regimen 12/11/2017  . Seizure (HCC) 05/24/2016  . Dehydration, mild 05/24/2016  . AKI (acute kidney injury) (HCC) 05/24/2016  . Marijuana use 05/24/2016    Past Surgical History:  Procedure Laterality Date  . NO PAST SURGERIES         Family History  Problem Relation Age of Onset  . Diabetes Mother   . Hypertension Mother   . Cancer Other   . Diabetes Father   . Seizures Maternal Grandfather     Social History   Tobacco Use  . Smoking status: Never Smoker  . Smokeless tobacco: Never Used  Substance Use Topics  . Alcohol use: No  . Drug use: Yes    Types: Marijuana    Comment: Daily use  of marijuana.    Home Medications Prior to Admission medications   Medication Sig Start Date End Date Taking? Authorizing Provider  APTIOM 800 MG TABS Take 1,200 mg by mouth daily. Taking 1.5 tablets = 1200mg  01/01/20   03/02/20, DO  busPIRone (BUSPAR) 5 MG tablet Take 5 mg by mouth 3 (three) times daily. Taking 1 tablet daily    [provider]  diclofenac Sodium (VOLTAREN) 1 % GEL Apply 4 g topically 4 (four) times daily as needed for pain. 10/01/19   [provider]  valproic acid (DEPAKENE) 250 MG capsule Take 4 capsules (1,000 mg total) by mouth 2 (two) times daily. 01/01/20 01/31/20  04/01/20, DO  valproic acid (DEPAKENE) 250 MG capsule Take 2 capsules (500 mg total) by mouth daily at 4 PM. 01/01/20 01/31/20  04/01/20, DO    Allergies    Keppra [levetiracetam], Tramadol, and Vimpat [lacosamide]  Review of Systems   Review of Systems  Unable to perform ROS: Mental status change    Physical Exam Updated Vital Signs There were no vitals taken for this visit.  Physical Exam Vitals and nursing note reviewed.  Constitutional:      General: He is in acute distress.     Appearance: He is not ill-appearing.  HENT:     Head: Normocephalic.     Nose: Nose normal.     Mouth/Throat:     Mouth:  Mucous membranes are moist.  Eyes:     Pupils: Pupils are equal, round, and reactive to light.  Cardiovascular:     Rate and Rhythm: Normal rate.     Pulses: Normal pulses.  Pulmonary:     Effort: Pulmonary effort is normal.  Abdominal:     General: Abdomen is flat.  Musculoskeletal:        General: Normal range of motion.     Cervical back: Normal range of motion.  Skin:    General: Skin is warm.     Capillary Refill: Capillary refill takes less than 2 seconds.  Neurological:     General: No focal deficit present.     Mental Status: He is alert.     Cranial Nerves: No cranial nerve deficit.     Sensory: No sensory deficit.     Motor: No weakness.    Psychiatric:        Mood and Affect: Mood normal.        Behavior: Behavior normal.     ED Results / Procedures / Treatments   Labs (all labs ordered are listed, but only abnormal results are displayed) Labs Reviewed - No data to display  EKG None  Radiology No results found.  Procedures .Critical Care Performed by: Pattricia Boss, MD Authorized by: Pattricia Boss, MD   Critical care provider statement:    Critical care time (minutes):  45   Critical care end time:  02/21/2020 4:27 PM   Critical care was necessary to treat or prevent imminent or life-threatening deterioration of the following conditions:  CNS failure or compromise   Critical care was time spent personally by me on the following activities:  Discussions with consultants, evaluation of patient's response to treatment, examination of patient, ordering and performing treatments and interventions, ordering and review of laboratory studies, ordering and review of radiographic studies, pulse oximetry, re-evaluation of patient's condition, obtaining history from patient or surrogate and review of old charts   (including critical care time)  Medications Ordered in ED Medications - No data to display  ED Course  I have reviewed the triage vital signs and the nursing notes.  Pertinent labs & imaging results that were available during my care of the patient were reviewed by me and considered in my medical decision making (see chart for details).    MDM Rules/Calculators/A&P                       2:54 PM Patient still sleepy arouses easily to verbal stimuli oriented to person and place. 4:25 PM Patient awake and at baseline per his mother.  He had not taken his morning dose of medications and will receive that here.  Calcium mildly elevated at 5.7.Will recheck Plan d/c Dr.Kohut will reevaluate after repeat potassium 2:54 PM Final Clinical Impression(s) / ED Diagnoses Final diagnoses:  Seizure [facility])    Rx / [place]  Orders ED Discharge Orders    None       Pattricia Boss, MD 02/21/20 1627

## 2020-02-21 NOTE — ED Triage Notes (Signed)
Pt was reported to have had a grand mal seizure that lasted approx. 5 minutes (per pt's mother that EMS reports to this RN). Pt does have history of seizures, more often that involves gazing. EMS reports that his med regimen for his seizures has been depakote & busparone. Pt is postictal upon arrival, VSS & has 5 mg of ativan already given via IM inj. While in route to ED.

## 2020-03-14 ENCOUNTER — Other Ambulatory Visit: Payer: Self-pay

## 2020-03-14 ENCOUNTER — Emergency Department (HOSPITAL_COMMUNITY)
Admission: EM | Admit: 2020-03-14 | Discharge: 2020-03-15 | Disposition: A | Discharge status: Home / Self Care | Payer: Medicaid Other | Attending: Emergency Medicine | Admitting: Emergency Medicine

## 2020-03-14 ENCOUNTER — Encounter (HOSPITAL_COMMUNITY): Payer: Self-pay

## 2020-03-14 DIAGNOSIS — Z638 Other specified problems related to primary support group: Secondary | ICD-10-CM

## 2020-03-14 DIAGNOSIS — Z79899 Other long term (current) drug therapy: Secondary | ICD-10-CM | POA: Diagnosis not present

## 2020-03-14 DIAGNOSIS — Z1339 Encounter for screening examination for other mental health and behavioral disorders: Secondary | ICD-10-CM | POA: Diagnosis present

## 2020-03-14 DIAGNOSIS — F1210 Cannabis abuse, uncomplicated: Secondary | ICD-10-CM | POA: Diagnosis not present

## 2020-03-14 DIAGNOSIS — R45851 Suicidal ideations: Secondary | ICD-10-CM | POA: Insufficient documentation

## 2020-03-14 DIAGNOSIS — F39 Unspecified mood [affective] disorder: Secondary | ICD-10-CM | POA: Diagnosis not present

## 2020-03-14 DIAGNOSIS — Z888 Allergy status to other drugs, medicaments and biological substances status: Secondary | ICD-10-CM | POA: Insufficient documentation

## 2020-03-14 DIAGNOSIS — J189 Pneumonia, unspecified organism: Secondary | ICD-10-CM | POA: Diagnosis not present

## 2020-03-14 DIAGNOSIS — G40909 Epilepsy, unspecified, not intractable, without status epilepticus: Secondary | ICD-10-CM | POA: Insufficient documentation

## 2020-03-14 DIAGNOSIS — Z20822 Contact with and (suspected) exposure to covid-19: Secondary | ICD-10-CM | POA: Insufficient documentation

## 2020-03-14 DIAGNOSIS — F1290 Cannabis use, unspecified, uncomplicated: Secondary | ICD-10-CM | POA: Diagnosis present

## 2020-03-14 DIAGNOSIS — Z885 Allergy status to narcotic agent status: Secondary | ICD-10-CM | POA: Diagnosis not present

## 2020-03-14 DIAGNOSIS — R4689 Other symptoms and signs involving appearance and behavior: Secondary | ICD-10-CM | POA: Diagnosis present

## 2020-03-14 LAB — COMPREHENSIVE METABOLIC PANEL
ALT: 23 U/L (ref 0–44)
AST: 22 U/L (ref 15–41)
Albumin: 4.8 g/dL (ref 3.5–5.0)
Alkaline Phosphatase: 31 U/L — ABNORMAL LOW (ref 38–126)
Anion gap: 12 (ref 5–15)
BUN: 13 mg/dL (ref 6–20)
CO2: 25 mmol/L (ref 22–32)
Calcium: 9.5 mg/dL (ref 8.9–10.3)
Chloride: 101 mmol/L (ref 98–111)
Creatinine, Ser: 1.07 mg/dL (ref 0.61–1.24)
GFR calc Af Amer: >60 mL/min (ref >60)
GFR calc non Af Amer: >60 mL/min (ref >60)
Glucose, Bld: 89 mg/dL (ref 70–99)
Potassium: 4 mmol/L (ref 3.5–5.1)
Sodium: 138 mmol/L (ref 135–145)
Total Bilirubin: 0.6 mg/dL (ref 0.3–1.2)
Total Protein: 7.8 g/dL (ref 6.5–8.1)

## 2020-03-14 LAB — CBC
HCT: 46.1 % (ref 39.0–52.0)
Hemoglobin: 15.1 g/dL (ref 13.0–17.0)
MCH: 29.4 pg (ref 26.0–34.0)
MCHC: 32.8 g/dL (ref 30.0–36.0)
MCV: 89.7 fL (ref 80.0–100.0)
Platelets: 246 10*3/uL (ref 150–400)
RBC: 5.14 MIL/uL (ref 4.22–5.81)
RDW: 13.8 % (ref 11.5–15.5)
WBC: 6.2 10*3/uL (ref 4.0–10.5)
nRBC: 0 % (ref 0.0–0.2)

## 2020-03-14 LAB — SARS CORONAVIRUS 2 BY RT PCR (HOSPITAL ORDER, PERFORMED IN ~~LOC~~ HOSPITAL LAB): SARS Coronavirus 2: NEGATIVE

## 2020-03-14 LAB — ETHANOL: Alcohol, Ethyl (B): <10 mg/dL (ref <10)

## 2020-03-14 LAB — SALICYLATE LEVEL: Salicylate Lvl: <7 mg/dL — ABNORMAL LOW (ref 7.0–30.0)

## 2020-03-14 LAB — VALPROIC ACID LEVEL: Valproic Acid Lvl: 132 ug/mL — ABNORMAL HIGH (ref 50.0–100.0)

## 2020-03-14 LAB — ACETAMINOPHEN LEVEL: Acetaminophen (Tylenol), Serum: <10 ug/mL — ABNORMAL LOW (ref 10–30)

## 2020-03-14 MED ORDER — VITAMIN D (ERGOCALCIFEROL) 1.25 MG (50000 UNIT) PO CAPS: 50000.0000 [IU] | ORAL_CAPSULE | ORAL | Status: DC

## 2020-03-14 MED ORDER — ESLICARBAZEPINE ACETATE 800 MG PO TABS: 1200.0000 mg | ORAL_TABLET | Freq: Every day | ORAL | Status: DC

## 2020-03-14 MED ORDER — NICOTINE 21 MG/24HR TD PT24: 21.0000 mg | MEDICATED_PATCH | Freq: Every day | TRANSDERMAL | Status: DC

## 2020-03-14 MED ORDER — ALUM & MAG HYDROXIDE-SIMETH 200-200-20 MG/5ML PO SUSP: 30.0000 mL | Freq: Four times a day (QID) | ORAL | Status: DC | PRN

## 2020-03-14 MED ORDER — ONDANSETRON HCL 4 MG PO TABS: 4.0000 mg | ORAL_TABLET | Freq: Three times a day (TID) | ORAL | Status: DC | PRN

## 2020-03-14 MED ORDER — BUSPIRONE HCL 5 MG PO TABS
7.5000 mg | ORAL_TABLET | Freq: Two times a day (BID) | ORAL | Status: DC
  Administered 2020-03-15: 7.5 mg via ORAL
  Filled 2020-03-14 (×2): qty 1.5

## 2020-03-14 MED ORDER — ZOLPIDEM TARTRATE 5 MG PO TABS: 5.0000 mg | ORAL_TABLET | Freq: Every evening | ORAL | Status: DC | PRN

## 2020-03-14 MED ORDER — ESCITALOPRAM OXALATE 10 MG PO TABS
5.0000 mg | ORAL_TABLET | Freq: Every day | ORAL | Status: DC
  Filled 2020-03-14: qty 1

## 2020-03-14 MED ORDER — IBUPROFEN 200 MG PO TABS: 600.0000 mg | ORAL_TABLET | Freq: Three times a day (TID) | ORAL | Status: DC | PRN

## 2020-03-14 MED ORDER — DIVALPROEX SODIUM ER 500 MG PO TB24: 3000.0000 mg | ORAL_TABLET | Freq: Every day | ORAL | Status: DC

## 2020-03-14 NOTE — ED Provider Notes (Signed)
[facility]-EMERGENCY DEPT Provider Note   CSN: [id number] Arrival date & time: 03/14/20  2047     History No chief complaint on file.   Dylan Moon is a 24 y.o. male.  Pt presents to the ED by the police with IVC papers.  Papers were taken out by his mother.  She said he has threatened to kill himself and other people and that he stalks the mother of his children.  She also said he's not been taking his meds.  Pt denies si/hi.  He has no complaints except that he wants to go home.        Past Medical History:  Diagnosis Date  . ADHD   . Scoliosis   . Seizures (HCC)    most recent 12/02/17    Patient Active Problem List   Diagnosis Date Noted  . HCAP (healthcare-associated pneumonia) 12/27/2019  . Status epilepticus (HCC) 12/24/2019  . Acute respiratory failure with hypoxemia (HCC)   . Seizures (HCC) 07/10/2018  . 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 Noncompliance with medication regimen 12/11/2017  . Seizure (HCC) 05/24/2016  . Dehydration, mild 05/24/2016  . AKI (acute kidney injury) (HCC) 05/24/2016  . Marijuana use 05/24/2016    Past Surgical History:  Procedure Laterality Date  . NO PAST SURGERIES         Family History  Problem Relation Age of Onset  . Diabetes Mother   . Hypertension Mother   . Cancer Other   . Diabetes Father   . Seizures Maternal Grandfather     Social History   Tobacco Use  . Smoking status: Never Smoker  . Smokeless tobacco: Never Used  Vaping Use  . Vaping Use: Never used  Substance Use Topics  . Alcohol use: No  . Drug use: Yes    Types: Marijuana    Comment: Daily use of marijuana.    Home Medications Prior to Admission medications   Medication Sig Start Date End Date Taking? Authorizing Provider  APTIOM 800 MG TABS Take 1,200 mg by mouth daily. Taking 1.5 tablets = 1200mg  01/01/20  Yes 03/02/20, DO  busPIRone (BUSPAR) 7.5 MG tablet Take  7.5 mg by mouth 2 (two) times daily. 01/19/20 01/18/21 Yes [provider]  divalproex (DEPAKOTE ER) 500 MG 24 hr tablet Take 3,000 mg by mouth daily. 01/04/20  Yes [provider]  escitalopram (LEXAPRO) 5 MG tablet Take 5 mg by mouth daily. 03/08/20  Yes [provider]  Vitamin D, Ergocalciferol, (DRISDOL) 1.25 MG (50000 UNIT) CAPS capsule Take 50,000 Units by mouth once a week. 01/24/20  Yes [provider]    Allergies    Depakene [divalproex sodium], Keppra [levetiracetam], Tramadol, and Vimpat [lacosamide]  Review of Systems   Review of Systems  All other systems reviewed and are negative.   Physical Exam Updated Vital Signs There were no vitals taken for this visit.  Physical Exam Vitals and nursing note reviewed.  Constitutional:      Appearance: Normal appearance.     Comments: Pt smells strongly of marijuana  HENT:     Head: Normocephalic and atraumatic.     Right Ear: External ear normal.     Left Ear: External ear normal.     Nose: Nose normal.     Mouth/Throat:     Mouth: Mucous membranes are moist.     Pharynx: Oropharynx is clear.  Eyes:     Extraocular Movements: Extraocular  movements intact.     Conjunctiva/sclera: Conjunctivae normal.     Pupils: Pupils are equal, round, and reactive to light.  Cardiovascular:     Rate and Rhythm: Normal rate and regular rhythm.     Pulses: Normal pulses.     Heart sounds: Normal heart sounds.  Pulmonary:     Effort: Pulmonary effort is normal.     Breath sounds: Normal breath sounds.  Abdominal:     General: Abdomen is flat. Bowel sounds are normal.     Palpations: Abdomen is soft.  Musculoskeletal:        General: Normal range of motion.     Cervical back: Normal range of motion and neck supple.  Skin:    General: Skin is warm.     Capillary Refill: Capillary refill takes less than 2 seconds.  Neurological:     General: No focal deficit present.     Mental Status: He is alert and  oriented to person, place, and time.  Psychiatric:        Mood and Affect: Mood normal.        Behavior: Behavior normal.        Thought Content: Thought content normal.        Judgment: Judgment normal.     ED Results / Procedures / Treatments   Labs (all labs ordered are listed, but only abnormal results are displayed) Labs Reviewed  COMPREHENSIVE METABOLIC PANEL - Abnormal; Notable for the following components:      Result Value   Alkaline Phosphatase 31 (*)    All other components within normal limits  SALICYLATE LEVEL - Abnormal; Notable for the following components:   Salicylate Lvl <7.0 (*)    All other components within normal limits  ACETAMINOPHEN LEVEL - Abnormal; Notable for the following components:   Acetaminophen (Tylenol), Serum <10 (*)    All other components within normal limits  SARS CORONAVIRUS 2 BY RT PCR (HOSPITAL ORDER, PERFORMED IN [facility])  ETHANOL  CBC  RAPID URINE DRUG SCREEN, HOSP PERFORMED  VALPROIC ACID LEVEL    EKG None  Radiology No results found.  Procedures Procedures (including critical care time)  Medications Ordered in ED Medications  ibuprofen (ADVIL) tablet 600 mg (has no administration in time range)  zolpidem (AMBIEN) tablet 5 mg (has no administration in time range)  ondansetron (ZOFRAN) tablet 4 mg (has no administration in time range)  alum & mag hydroxide-simeth (MAALOX/MYLANTA) 200-200-20 MG/5ML suspension 30 mL (has no administration in time range)  nicotine (NICODERM CQ - dosed in mg/24 hours) patch 21 mg (has no administration in time range)  Eslicarbazepine Acetate TABS 1,200 mg (has no administration in time range)  busPIRone (BUSPAR) tablet 7.5 mg (has no administration in time range)  divalproex (DEPAKOTE ER) 24 hr tablet 3,000 mg (has no administration in time range)  escitalopram (LEXAPRO) tablet 5 mg (has no administration in time range)  Vitamin D (Ergocalciferol) (DRISDOL) capsule 50,000 Units  (has no administration in time range)    ED Course  I have reviewed the triage vital signs and the nursing notes.  Pertinent labs & imaging results that were available during my care of the patient were reviewed by me and considered in my medical decision making (see chart for details).    MDM Rules/Calculators/A&P                          TTS consult has been ordered.  Home meds and diet ordered. Disposition per TTS. Final Clinical Impression(s) / ED Diagnoses Final diagnoses:  Mood disorder ([place])  Marijuana abuse  Seizure disorder [facility])    Rx / DC Orders ED Discharge Orders    None       Isla Pence, MD 03/14/20 2258

## 2020-03-14 NOTE — ED Triage Notes (Signed)
Pt is under IVC by his mom because he doesn't take any of hie meds, threatens to kill himself, threatens people on social media and stalks the mother of his children Pt has run form the ED before and currently has [employer] at bedside

## 2020-03-15 ENCOUNTER — Encounter (HOSPITAL_COMMUNITY): Payer: Self-pay | Admitting: Registered Nurse

## 2020-03-15 DIAGNOSIS — R4689 Other symptoms and signs involving appearance and behavior: Secondary | ICD-10-CM | POA: Diagnosis present

## 2020-03-15 DIAGNOSIS — Z638 Other specified problems related to primary support group: Secondary | ICD-10-CM

## 2020-03-15 NOTE — ED Notes (Signed)
Seizure medication brought in by family member.  Will send to pharmacy for dispense.

## 2020-03-15 NOTE — Consult Note (Signed)
  Dylan Moon, 24 y.o., male patient seen via tele psych by this provider, consulted with Dr. Lucianne Muss; and chart reviewed on 03/15/20.  On evaluation Dylan Moon reports "I'm at the hospital cause my mama telling lies on me, saying I want to kill myself.  I got 2 kids, I wouldn't do nothing to kill myself."  Patient states that he lives with his mother who is also his guardian "But I'm going to get guardian back cause I am a 24 yr old man and she want to treat me like a kid."  Patient states that his mother doesn't like his girlfriend and his girlfriends mother doesn't like home and want them to stay away from each other.  States that the takes his medications as ordered "If I spend the night with a friend; I take some my medicine with me to take; when I get back home; my mama be telling me I didn't take it and try to make me take more; I be telling her mama you gone make me overdose taking mo medicine."   During evaluation Dylan Moon is alert/oriented x 4; calm/cooperative; and mood is congruent with affect.  He does not appear to be responding to internal/external stimuli or delusional thoughts.  Patient denies suicidal/self-harm/homicidal ideation, psychosis, and paranoia.  Patient answered question appropriately.   Collateral information gathered by social work (see Francee Nodal, LCW note) for more information  Recommendation:  Follow up with current outpatient psychiatric provider    Disposition:  Psychiatrically cleared No evidence of imminent risk to self or others at present.   Patient does not meet criteria for psychiatric inpatient admission. Supportive therapy provided about ongoing stressors. Discussed crisis plan, support from social network, calling 911, coming to the Emergency Department, and calling Suicide Hotline.   Spoke with Dr. Dalene Seltzer; informed of recommendation/disposition:  Rm 31:  Keondrick Yadao:  Psychiatrically cleared.  IVC will need to be rescinded by EDP and  can be discharged:  His mother is his guardian and social work is calling mother to inform that patient will be discharged and she will need to pick up.

## 2020-03-15 NOTE — ED Provider Notes (Signed)
  Physical Exam  BP (!) 140/105 (BP Location: Right Arm)   Pulse 63   Temp 98.3 F (36.8 C) (Oral)   Resp 16   SpO2 98%   Physical Exam  ED Course/Procedures     Procedures  MDM   Received care of patient at 7 AM.  Please see previous notes for history, and physical exam.  Briefly this is a 24 year old male who presented with IVC papers after he became upset and threatened to hurt himself.  He was medically cleared, and psychiatry evaluated him.  Psychiatry feels that he is stable for outpatient follow-up.  He reports to me he has no thoughts of hurting himself or anyone else, and had said that because he was upset at the time. Contracted for safety. IVC rescinded. Patient discharged in stable condition with understanding of reasons to return.       Alvira Monday, MD 03/15/20 1123

## 2020-03-15 NOTE — ED Notes (Signed)
1 Pt Belonging Bag located in locker 31
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
## 2020-03-15 NOTE — ED Notes (Signed)
As I was preparing medications to go to pharmacy for labeling I got notified of pending discharge.  Meds and clothes will be returned after he changes into his clothes.

## 2020-03-15 NOTE — ED Notes (Signed)
TTS cart placed in room with pt for consult
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
## 2020-04-26 ENCOUNTER — Ambulatory Visit (HOSPITAL_COMMUNITY)
Admission: EM | Admit: 2020-04-26 | Discharge: 2020-04-26 | Disposition: A | Discharge status: Home / Self Care | Payer: Medicaid Other

## 2020-04-26 ENCOUNTER — Encounter (HOSPITAL_COMMUNITY): Payer: Self-pay

## 2020-04-26 ENCOUNTER — Other Ambulatory Visit: Payer: Self-pay

## 2020-04-26 ENCOUNTER — Encounter (HOSPITAL_COMMUNITY): Payer: Self-pay | Admitting: Registered Nurse

## 2020-04-26 NOTE — BH Assessment (Signed)
Patient requests court mandated psychological assessment.  Writer was informed by the front desk scheduling that we do not do court mandated assessments.    Patient denies SI/HI/Psychosis/Substance Abuse.  Patient declined MSE Exam.  Patient and his mother reports that they already have a list of other providers that they can go to in order to get an appointment.

## 2020-05-16 ENCOUNTER — Emergency Department (HOSPITAL_COMMUNITY)
Admission: EM | Admit: 2020-05-16 | Discharge: 2020-05-16 | Disposition: A | Discharge status: Home / Self Care | Payer: Medicaid Other | Attending: Emergency Medicine | Admitting: Emergency Medicine

## 2020-05-16 ENCOUNTER — Encounter (HOSPITAL_COMMUNITY): Payer: Self-pay | Admitting: Emergency Medicine

## 2020-05-16 ENCOUNTER — Other Ambulatory Visit: Payer: Self-pay

## 2020-05-16 DIAGNOSIS — F1590 Other stimulant use, unspecified, uncomplicated: Secondary | ICD-10-CM | POA: Diagnosis not present

## 2020-05-16 DIAGNOSIS — Y939 Activity, unspecified: Secondary | ICD-10-CM | POA: Diagnosis not present

## 2020-05-16 DIAGNOSIS — S00512A Abrasion of oral cavity, initial encounter: Secondary | ICD-10-CM | POA: Insufficient documentation

## 2020-05-16 DIAGNOSIS — R519 Headache, unspecified: Secondary | ICD-10-CM | POA: Diagnosis not present

## 2020-05-16 DIAGNOSIS — Y92009 Unspecified place in unspecified non-institutional (private) residence as the place of occurrence of the external cause: Secondary | ICD-10-CM | POA: Diagnosis not present

## 2020-05-16 DIAGNOSIS — Y999 Unspecified external cause status: Secondary | ICD-10-CM | POA: Insufficient documentation

## 2020-05-16 DIAGNOSIS — S00502A Unspecified superficial injury of oral cavity, initial encounter: Secondary | ICD-10-CM | POA: Diagnosis present

## 2020-05-16 DIAGNOSIS — Z79899 Other long term (current) drug therapy: Secondary | ICD-10-CM | POA: Insufficient documentation

## 2020-05-16 DIAGNOSIS — R569 Unspecified convulsions: Secondary | ICD-10-CM | POA: Diagnosis not present

## 2020-05-16 DIAGNOSIS — X58XXXA Exposure to other specified factors, initial encounter: Secondary | ICD-10-CM | POA: Diagnosis not present

## 2020-05-16 MED ORDER — ACETAMINOPHEN 500 MG PO TABS
1000.0000 mg | ORAL_TABLET | Freq: Once | ORAL | Status: AC
  Administered 2020-05-16: 1000 mg via ORAL
  Filled 2020-05-16: qty 2

## 2020-05-16 MED ORDER — DIVALPROEX SODIUM ER 500 MG PO TB24
3000.0000 mg | ORAL_TABLET | Freq: Every day | ORAL | Status: DC
  Administered 2020-05-16: 3000 mg via ORAL
  Filled 2020-05-16: qty 6

## 2020-05-16 NOTE — ED Provider Notes (Signed)
Dylan Moon   CSN: [id number] Arrival date & time: 05/16/20  1028     History Chief Complaint  Patient presents with  . Seizures    Dylan Moon is a 24 y.o. male.  HPI     24yo male with history of epilepsy with admission 12/2019 for status epilepticus followed by [facility] who presents with concern for seizure.  Per family he began shaking in bed. On EMS arrival he appeared postictal, did not require medications.    Admits to missing a few doses of his depakote in the last week-reports pills are large and he doesn't like to take them for this reason at times.  Reports mild headache at this time which is typical after his seizures and feels like he is back to normal and would like to go home. Denies numbness/weakness. Denies acute changes, no vomiting, no diarrhea, no fever or acute illness.      Per Neurology Moon: Pt is a 24 y.o.RH male with following: //Epilepsy- potentially focal epilepsy given prior focal EEG findings (albeit not consistently left temporal) and MRI findings. Unfortunately, it has been difficult to determine degree of intractability given history of noncompliance although appears to more recently been compliant and no definite seizure since 02/21/20. Will attempt amb EEG (patient twice has failed to complete EMU) to evaluate for occult seizures. Ultimately, if he is determined to be medically intractable will be imperative to try to best localize seizure focus given heterotopia as he conceivably could be a candidate for laser ablation. I discussed this with patient and his mother who are in agreement. I also emphasized importance of compliance with AED.   PLAN:  - continue VPA 3g ER and aptiom 1200mg  qd  - take Vit D 900-1000IU daily  - amb EEG    Past Medical History:  Diagnosis Date  . ADHD   . Scoliosis   . Seizures (HCC)    most recent 12/02/17    Patient Active Problem List   Diagnosis Date  Noted  . Family discord 03/15/2020  . Aggressive behavior 03/15/2020  . HCAP (healthcare-associated pneumonia) 12/27/2019  . Status epilepticus (HCC) 12/24/2019  . Acute respiratory failure with hypoxemia (HCC)   . Seizures (HCC) 07/10/2018  . 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 Noncompliance with medication regimen 12/11/2017  . Seizure (HCC) 05/24/2016  . Dehydration, mild 05/24/2016  . AKI (acute kidney injury) (HCC) 05/24/2016  . Marijuana use 05/24/2016    Past Surgical History:  Procedure Laterality Date  . NO PAST SURGERIES         Family History  Problem Relation Age of Onset  . Diabetes Mother   . Hypertension Mother   . Cancer Other   . Diabetes Father   . Seizures Maternal Grandfather     Social History   Tobacco Use  . Smoking status: Never Smoker  . Smokeless tobacco: Never Used  Vaping Use  . Vaping Use: Never used  Substance Use Topics  . Alcohol use: No  . Drug use: Yes    Types: Marijuana    Comment: Daily use of marijuana.    Home Medications Prior to Admission medications   Medication Sig Start Date End Date Taking? Authorizing Provider  APTIOM 800 MG TABS Take 1,200 mg by mouth daily. Taking 1.5 tablets = 1200mg  01/01/20   , DO  busPIRone (BUSPAR) 7.5 MG tablet Take 7.5 mg by mouth 2 (two) times  daily. 01/19/20 01/18/21  [provider]  divalproex (DEPAKOTE ER) 500 MG 24 hr tablet Take 3,000 mg by mouth daily. 01/04/20   [provider]  escitalopram (LEXAPRO) 5 MG tablet Take 5 mg by mouth daily. 03/08/20   [provider]  Vitamin D, Ergocalciferol, (DRISDOL) 1.25 MG (50000 UNIT) CAPS capsule Take 50,000 Units by mouth once a week. 01/24/20   [provider]    Allergies    Depakene [divalproex sodium], Keppra [levetiracetam], Tramadol, and Vimpat [lacosamide]  Review of Systems   Review of Systems  Constitutional: Negative for fever.  Eyes:  Negative for visual disturbance.  Respiratory: Negative for cough and shortness of breath.   Cardiovascular: Negative for chest pain.  Gastrointestinal: Negative for abdominal pain and vomiting.  Genitourinary: Negative for dysuria.  Skin: Positive for wound (tongue abrasion).  Neurological: Positive for seizures and headaches. Negative for syncope, facial asymmetry, weakness and numbness.    Physical Exam Updated Vital Signs BP 136/79 (BP Location: Left Arm)   Pulse 82   Temp 97.8 F (36.6 C)   Resp 18   SpO2 96%   Physical Exam Vitals and nursing Moon reviewed.  Constitutional:      General: He is not in acute distress.    Appearance: Normal appearance. He is not ill-appearing, toxic-appearing or diaphoretic.  HENT:     Head: Normocephalic.     Comments: Lateral tongue abrasions Eyes:     Conjunctiva/sclera: Conjunctivae normal.  Cardiovascular:     Rate and Rhythm: Normal rate and regular rhythm.     Pulses: Normal pulses.  Pulmonary:     Effort: Pulmonary effort is normal. No respiratory distress.  Musculoskeletal:        General: No deformity or signs of injury.     Cervical back: No rigidity.  Skin:    General: Skin is warm and dry.     Coloration: Skin is not jaundiced or pale.  Neurological:     General: No focal deficit present.     Mental Status: He is alert and oriented to person, place, and time.     Motor: No weakness.     Gait: Gait normal.     ED Results / Procedures / Treatments   Labs (all labs ordered are listed, but only abnormal results are displayed) Labs Reviewed - No data to display  EKG None  Radiology No results found.  Procedures Procedures (including critical care time)  Medications Ordered in ED Medications  acetaminophen (TYLENOL) tablet 1,000 mg (1,000 mg Oral Given 05/16/20 1103)    ED Course  I have reviewed the triage vital signs and the nursing notes.  Pertinent labs & imaging results that were available during my  care of the patient were reviewed by me and considered in my medical decision making (see chart for details).    MDM Rules/Calculators/A&P                           24yo male with history of epilepsy with admission 12/2019 for status epilepticus followed by [facility] who presents with concern for seizure.  He was postictal for EMS, improved on arrival to the ED.  Reports he has been nonadherent to seizure medications and missed doses in the last week. Does not describe other illness and have low suspicion for other underlying infection, trauma, or electrolyte abnormalities as cause of breakthrough seizure.  He is returned to baseline and is requesting discharge.  Glucose normal with EMS. Given home dose of depakote. Do not feel levels will change care in setting of noncompliance. Recommend continued follow up with Neurology.      Final Clinical Impression(s) / ED Diagnoses Final diagnoses:  Seizure [facility])    Rx / DC Orders ED Discharge Orders    None       Alvira Monday, MD 05/16/20 2202

## 2020-05-16 NOTE — ED Triage Notes (Signed)
Pt arrives from home, family members said he started shaking in the bed. Hx of seizures. Patient has been compliant with medications Neurologist appointment 05/17/2020 Patient was postictal  for EMS Patient arrives AO x 4 Hx of TBI Complains of headache

## 2020-05-16 NOTE — ED Notes (Signed)
Provider at bedside

## 2020-07-01 ENCOUNTER — Emergency Department (HOSPITAL_COMMUNITY): Payer: Medicaid Other

## 2020-07-01 ENCOUNTER — Inpatient Hospital Stay (HOSPITAL_COMMUNITY): Payer: Medicaid Other

## 2020-07-01 ENCOUNTER — Inpatient Hospital Stay (HOSPITAL_COMMUNITY)
Admission: EM | Admit: 2020-07-01 | Discharge: 2020-07-20 | DRG: 100 | Disposition: A | Discharge status: Other Acute Inpatient Hospital | Payer: Medicaid Other | Attending: Internal Medicine | Admitting: Internal Medicine

## 2020-07-01 DIAGNOSIS — Z888 Allergy status to other drugs, medicaments and biological substances status: Secondary | ICD-10-CM

## 2020-07-01 DIAGNOSIS — R040 Epistaxis: Secondary | ICD-10-CM | POA: Diagnosis present

## 2020-07-01 DIAGNOSIS — G40909 Epilepsy, unspecified, not intractable, without status epilepticus: Secondary | ICD-10-CM | POA: Diagnosis not present

## 2020-07-01 DIAGNOSIS — Z978 Presence of other specified devices: Secondary | ICD-10-CM

## 2020-07-01 DIAGNOSIS — F319 Bipolar disorder, unspecified: Secondary | ICD-10-CM | POA: Diagnosis present

## 2020-07-01 DIAGNOSIS — J95851 Ventilator associated pneumonia: Secondary | ICD-10-CM | POA: Diagnosis present

## 2020-07-01 DIAGNOSIS — Z79899 Other long term (current) drug therapy: Secondary | ICD-10-CM | POA: Diagnosis not present

## 2020-07-01 DIAGNOSIS — K6389 Other specified diseases of intestine: Secondary | ICD-10-CM

## 2020-07-01 DIAGNOSIS — J9601 Acute respiratory failure with hypoxia: Secondary | ICD-10-CM | POA: Diagnosis present

## 2020-07-01 DIAGNOSIS — Z20822 Contact with and (suspected) exposure to covid-19: Secondary | ICD-10-CM | POA: Diagnosis present

## 2020-07-01 DIAGNOSIS — K567 Ileus, unspecified: Secondary | ICD-10-CM | POA: Diagnosis not present

## 2020-07-01 DIAGNOSIS — F1290 Cannabis use, unspecified, uncomplicated: Secondary | ICD-10-CM | POA: Diagnosis present

## 2020-07-01 DIAGNOSIS — G40301 Generalized idiopathic epilepsy and epileptic syndromes, not intractable, with status epilepticus: Secondary | ICD-10-CM | POA: Diagnosis not present

## 2020-07-01 DIAGNOSIS — F419 Anxiety disorder, unspecified: Secondary | ICD-10-CM | POA: Diagnosis present

## 2020-07-01 DIAGNOSIS — Y848 Other medical procedures as the cause of abnormal reaction of the patient, or of later complication, without mention of misadventure at the time of the procedure: Secondary | ICD-10-CM | POA: Diagnosis present

## 2020-07-01 DIAGNOSIS — F1590 Other stimulant use, unspecified, uncomplicated: Secondary | ICD-10-CM | POA: Diagnosis present

## 2020-07-01 DIAGNOSIS — F909 Attention-deficit hyperactivity disorder, unspecified type: Secondary | ICD-10-CM | POA: Diagnosis present

## 2020-07-01 DIAGNOSIS — E7220 Disorder of urea cycle metabolism, unspecified: Secondary | ICD-10-CM | POA: Diagnosis present

## 2020-07-01 DIAGNOSIS — R410 Disorientation, unspecified: Secondary | ICD-10-CM | POA: Diagnosis not present

## 2020-07-01 DIAGNOSIS — G40911 Epilepsy, unspecified, intractable, with status epilepticus: Principal | ICD-10-CM | POA: Diagnosis present

## 2020-07-01 DIAGNOSIS — G40901 Epilepsy, unspecified, not intractable, with status epilepticus: Secondary | ICD-10-CM | POA: Diagnosis present

## 2020-07-01 DIAGNOSIS — G928 Other toxic encephalopathy: Secondary | ICD-10-CM | POA: Diagnosis present

## 2020-07-01 DIAGNOSIS — R001 Bradycardia, unspecified: Secondary | ICD-10-CM | POA: Diagnosis present

## 2020-07-01 DIAGNOSIS — Z9981 Dependence on supplemental oxygen: Secondary | ICD-10-CM

## 2020-07-01 DIAGNOSIS — F4325 Adjustment disorder with mixed disturbance of emotions and conduct: Secondary | ICD-10-CM | POA: Diagnosis present

## 2020-07-01 DIAGNOSIS — J181 Lobar pneumonia, unspecified organism: Secondary | ICD-10-CM | POA: Diagnosis not present

## 2020-07-01 DIAGNOSIS — F4329 Adjustment disorder with other symptoms: Secondary | ICD-10-CM | POA: Diagnosis present

## 2020-07-01 DIAGNOSIS — R569 Unspecified convulsions: Secondary | ICD-10-CM

## 2020-07-01 DIAGNOSIS — Z8249 Family history of ischemic heart disease and other diseases of the circulatory system: Secondary | ICD-10-CM

## 2020-07-01 DIAGNOSIS — Z833 Family history of diabetes mellitus: Secondary | ICD-10-CM

## 2020-07-01 DIAGNOSIS — I10 Essential (primary) hypertension: Secondary | ICD-10-CM | POA: Diagnosis present

## 2020-07-01 DIAGNOSIS — Z9119 Patient's noncompliance with other medical treatment and regimen: Secondary | ICD-10-CM

## 2020-07-01 DIAGNOSIS — M419 Scoliosis, unspecified: Secondary | ICD-10-CM | POA: Diagnosis present

## 2020-07-01 DIAGNOSIS — J189 Pneumonia, unspecified organism: Secondary | ICD-10-CM | POA: Diagnosis present

## 2020-07-01 DIAGNOSIS — Z781 Physical restraint status: Secondary | ICD-10-CM

## 2020-07-01 DIAGNOSIS — K56609 Unspecified intestinal obstruction, unspecified as to partial versus complete obstruction: Secondary | ICD-10-CM

## 2020-07-01 LAB — CBC WITH DIFFERENTIAL/PLATELET
Abs Immature Granulocytes: 0.2 10*3/uL — ABNORMAL HIGH (ref 0.00–0.07)
Basophils Absolute: 0.1 10*3/uL (ref 0.0–0.1)
Basophils Relative: 1 %
Eosinophils Absolute: 0.2 10*3/uL (ref 0.0–0.5)
Eosinophils Relative: 1 %
HCT: 57.8 % — ABNORMAL HIGH (ref 39.0–52.0)
Hemoglobin: 16.7 g/dL (ref 13.0–17.0)
Immature Granulocytes: 1 %
Lymphocytes Relative: 63 %
Lymphs Abs: 10.1 10*3/uL — ABNORMAL HIGH (ref 0.7–4.0)
MCH: 28.7 pg (ref 26.0–34.0)
MCHC: 28.9 g/dL — ABNORMAL LOW (ref 30.0–36.0)
MCV: 99.5 fL (ref 80.0–100.0)
Monocytes Absolute: 1.8 10*3/uL — ABNORMAL HIGH (ref 0.1–1.0)
Monocytes Relative: 12 %
Neutro Abs: 3.4 10*3/uL (ref 1.7–7.7)
Neutrophils Relative %: 22 %
Platelets: 259 10*3/uL (ref 150–400)
RBC: 5.81 MIL/uL (ref 4.22–5.81)
RDW: 13.2 % (ref 11.5–15.5)
WBC: 15.8 10*3/uL — ABNORMAL HIGH (ref 4.0–10.5)
nRBC: 0 % (ref 0.0–0.2)

## 2020-07-01 LAB — COMPREHENSIVE METABOLIC PANEL
ALT: 19 U/L (ref 0–44)
AST: 40 U/L (ref 15–41)
Albumin: 4.9 g/dL (ref 3.5–5.0)
Alkaline Phosphatase: 43 U/L (ref 38–126)
Anion gap: 33 — ABNORMAL HIGH (ref 5–15)
BUN: 18 mg/dL (ref 6–20)
CO2: 8 mmol/L — ABNORMAL LOW (ref 22–32)
Calcium: 10 mg/dL (ref 8.9–10.3)
Chloride: 103 mmol/L (ref 98–111)
Creatinine, Ser: 1.5 mg/dL — ABNORMAL HIGH (ref 0.61–1.24)
GFR, Estimated: >60 mL/min (ref >60)
Glucose, Bld: 218 mg/dL — ABNORMAL HIGH (ref 70–99)
Potassium: 4.5 mmol/L (ref 3.5–5.1)
Sodium: 144 mmol/L (ref 135–145)
Total Bilirubin: 1.1 mg/dL (ref 0.3–1.2)
Total Protein: 7.9 g/dL (ref 6.5–8.1)

## 2020-07-01 LAB — I-STAT ARTERIAL BLOOD GAS, ED
Acid-Base Excess: 4 mmol/L — ABNORMAL HIGH (ref 0.0–2.0)
Acid-base deficit: 24 mmol/L — ABNORMAL HIGH (ref 0.0–2.0)
Acid-base deficit: 7 mmol/L — ABNORMAL HIGH (ref 0.0–2.0)
Bicarbonate: 11.8 mmol/L — ABNORMAL LOW (ref 20.0–28.0)
Bicarbonate: 20.2 mmol/L (ref 20.0–28.0)
Bicarbonate: 25.4 mmol/L (ref 20.0–28.0)
Calcium, Ion: 1.25 mmol/L (ref 1.15–1.40)
Calcium, Ion: 1.19 mmol/L (ref 1.15–1.40)
Calcium, Ion: 1.19 mmol/L (ref 1.15–1.40)
HCT: 54 % — ABNORMAL HIGH (ref 39.0–52.0)
HCT: 48 % (ref 39.0–52.0)
HCT: 45 % (ref 39.0–52.0)
Hemoglobin: 18.4 g/dL — ABNORMAL HIGH (ref 13.0–17.0)
Hemoglobin: 16.3 g/dL (ref 13.0–17.0)
Hemoglobin: 15.3 g/dL (ref 13.0–17.0)
O2 Saturation: 81 %
O2 Saturation: 99 %
O2 Saturation: 100 %
Potassium: 3.7 mmol/L (ref 3.5–5.1)
Potassium: 4.9 mmol/L (ref 3.5–5.1)
Potassium: 3.8 mmol/L (ref 3.5–5.1)
Sodium: 138 mmol/L (ref 135–145)
Sodium: 138 mmol/L (ref 135–145)
Sodium: 140 mmol/L (ref 135–145)
TCO2: 14 mmol/L — ABNORMAL LOW (ref 22–32)
TCO2: 22 mmol/L (ref 22–32)
TCO2: 26 mmol/L (ref 22–32)
pCO2 arterial: 71.1 mmHg (ref 32.0–48.0)
pCO2 arterial: 46.3 mmHg (ref 32.0–48.0)
pCO2 arterial: 29.4 mmHg — ABNORMAL LOW (ref 32.0–48.0)
pH, Arterial: 6.828 — CL (ref 7.350–7.450)
pH, Arterial: 7.248 — ABNORMAL LOW (ref 7.350–7.450)
pH, Arterial: 7.544 — ABNORMAL HIGH (ref 7.350–7.450)
pO2, Arterial: 82 mmHg — ABNORMAL LOW (ref 83.0–108.0)
pO2, Arterial: 192 mmHg — ABNORMAL HIGH (ref 83.0–108.0)
pO2, Arterial: 156 mmHg — ABNORMAL HIGH (ref 83.0–108.0)

## 2020-07-01 LAB — RESPIRATORY PANEL BY RT PCR (FLU A&B, COVID)
Influenza A by PCR: NEGATIVE
Influenza B by PCR: NEGATIVE
SARS Coronavirus 2 by RT PCR: NEGATIVE

## 2020-07-01 LAB — CBG MONITORING, ED: Glucose-Capillary: 213 mg/dL — ABNORMAL HIGH (ref 70–99)

## 2020-07-01 LAB — ETHANOL: Alcohol, Ethyl (B): <10 mg/dL (ref <10)

## 2020-07-01 LAB — VALPROIC ACID LEVEL: Valproic Acid Lvl: 30 ug/mL — ABNORMAL LOW (ref 50.0–100.0)

## 2020-07-01 LAB — MAGNESIUM: Magnesium: 2.3 mg/dL (ref 1.7–2.4)

## 2020-07-01 MED ORDER — NON FORMULARY: 1200.0000 mg | Freq: Every day | Status: DC

## 2020-07-01 MED ORDER — LORAZEPAM 2 MG/ML IJ SOLN
INTRAMUSCULAR | Status: AC | PRN
  Administered 2020-07-01 (×2): 2 mg via INTRAVENOUS

## 2020-07-01 MED ORDER — CHLORHEXIDINE GLUCONATE CLOTH 2 % EX PADS
6.0000 | MEDICATED_PAD | Freq: Every day | CUTANEOUS | Status: DC
  Administered 2020-07-03 – 2020-07-19 (×17): 6 via TOPICAL

## 2020-07-01 MED ORDER — MIDAZOLAM 50MG/50ML (1MG/ML) PREMIX INFUSION
0.5000 mg/h | INTRAVENOUS | Status: DC
  Administered 2020-07-01: 0.5 mg/h via INTRAVENOUS
  Administered 2020-07-02 (×5): 15 mg/h via INTRAVENOUS
  Administered 2020-07-02 – 2020-07-03 (×2): 5 mg/h via INTRAVENOUS
  Filled 2020-07-01 (×4): qty 50
  Filled 2020-07-01: qty 100
  Filled 2020-07-01 (×4): qty 50
  Filled 2020-07-01: qty 100

## 2020-07-01 MED ORDER — VALPROATE SODIUM 500 MG/5ML IV SOLN
2000.0000 mg | Freq: Once | INTRAVENOUS | Status: DC
  Filled 2020-07-01: qty 20

## 2020-07-01 MED ORDER — ENOXAPARIN SODIUM 40 MG/0.4ML ~~LOC~~ SOLN
40.0000 mg | SUBCUTANEOUS | Status: DC
  Administered 2020-07-01 – 2020-07-18 (×17): 40 mg via SUBCUTANEOUS
  Filled 2020-07-01 (×18): qty 0.4

## 2020-07-01 MED ORDER — PROPOFOL 1000 MG/100ML IV EMUL
INTRAVENOUS | Status: AC
  Administered 2020-07-01: 5 ug/kg/min via INTRAVENOUS
  Filled 2020-07-01: qty 100

## 2020-07-01 MED ORDER — FENTANYL CITRATE (PF) 100 MCG/2ML IJ SOLN
25.0000 ug | INTRAMUSCULAR | Status: DC | PRN
  Administered 2020-07-06: 25 ug via INTRAVENOUS
  Administered 2020-07-06 – 2020-07-07 (×4): 100 ug via INTRAVENOUS
  Administered 2020-07-09: 50 ug via INTRAVENOUS
  Administered 2020-07-11: 100 ug via INTRAVENOUS
  Filled 2020-07-01 (×9): qty 2

## 2020-07-01 MED ORDER — ACETAMINOPHEN 160 MG/5ML PO SOLN
650.0000 mg | Freq: Four times a day (QID) | ORAL | Status: DC | PRN
  Administered 2020-07-05 – 2020-07-06 (×2): 650 mg
  Filled 2020-07-01 (×2): qty 20.3

## 2020-07-01 MED ORDER — SUCCINYLCHOLINE CHLORIDE 20 MG/ML IJ SOLN
INTRAMUSCULAR | Status: AC | PRN
  Administered 2020-07-01: 120 mg via INTRAVENOUS

## 2020-07-01 MED ORDER — POLYETHYLENE GLYCOL 3350 17 G PO PACK
17.0000 g | PACK | Freq: Every day | ORAL | Status: DC | PRN
  Administered 2020-07-09 – 2020-07-11 (×2): 17 g via ORAL
  Filled 2020-07-01 (×2): qty 1

## 2020-07-01 MED ORDER — LORAZEPAM 2 MG/ML IJ SOLN
INTRAMUSCULAR | Status: AC
  Filled 2020-07-01: qty 2

## 2020-07-01 MED ORDER — DOCUSATE SODIUM 50 MG/5ML PO LIQD
100.0000 mg | Freq: Two times a day (BID) | ORAL | Status: DC | PRN
  Administered 2020-07-12 – 2020-07-13 (×2): 100 mg
  Filled 2020-07-01: qty 10

## 2020-07-01 MED ORDER — ETOMIDATE 2 MG/ML IV SOLN
INTRAVENOUS | Status: AC | PRN
  Administered 2020-07-01: 20 mg via INTRAVENOUS

## 2020-07-01 MED ORDER — LORAZEPAM 2 MG/ML IJ SOLN: 2.0000 mg | Freq: Once | INTRAMUSCULAR | Status: DC

## 2020-07-01 MED ORDER — PROPOFOL 1000 MG/100ML IV EMUL
INTRAVENOUS | Status: AC | PRN
  Administered 2020-07-01: 199.6 ug via INTRAVENOUS

## 2020-07-01 MED ORDER — ESLICARBAZEPINE ACETATE 800 MG PO TABS
1600.0000 mg | ORAL_TABLET | Freq: Every day | ORAL | Status: DC
  Administered 2020-07-01 – 2020-07-07 (×6): 1600 mg
  Filled 2020-07-01 (×8): qty 2

## 2020-07-01 MED ORDER — ORAL CARE MOUTH RINSE
15.0000 mL | OROMUCOSAL | Status: DC
  Administered 2020-07-02: 15 mL via OROMUCOSAL

## 2020-07-01 MED ORDER — VALPROIC ACID 250 MG/5ML PO SOLN
1000.0000 mg | Freq: Three times a day (TID) | ORAL | Status: DC
  Administered 2020-07-01 – 2020-07-06 (×16): 1000 mg
  Filled 2020-07-01 (×17): qty 20

## 2020-07-01 MED ORDER — PROPOFOL 1000 MG/100ML IV EMUL
5.0000 ug/kg/min | INTRAVENOUS | Status: DC
  Administered 2020-07-01: 50 ug/kg/min via INTRAVENOUS
  Administered 2020-07-01: 5 ug/kg/min via INTRAVENOUS
  Administered 2020-07-02: 60 ug/kg/min via INTRAVENOUS
  Administered 2020-07-02: 10 ug/kg/min via INTRAVENOUS
  Administered 2020-07-02 (×2): 50 ug/kg/min via INTRAVENOUS
  Administered 2020-07-02: 60 ug/kg/min via INTRAVENOUS
  Administered 2020-07-02 – 2020-07-03 (×2): 50 ug/kg/min via INTRAVENOUS
  Administered 2020-07-03: 40 ug/kg/min via INTRAVENOUS
  Administered 2020-07-03: 60 ug/kg/min via INTRAVENOUS
  Administered 2020-07-03 (×2): 30 ug/kg/min via INTRAVENOUS
  Administered 2020-07-03 (×2): 60 ug/kg/min via INTRAVENOUS
  Administered 2020-07-04: 20 ug/kg/min via INTRAVENOUS
  Administered 2020-07-04: 10 ug/kg/min via INTRAVENOUS
  Administered 2020-07-05: 15 ug/kg/min via INTRAVENOUS
  Filled 2020-07-01 (×3): qty 100
  Filled 2020-07-01 (×2): qty 200
  Filled 2020-07-01 (×14): qty 100

## 2020-07-01 MED ORDER — FAMOTIDINE IN NACL 20-0.9 MG/50ML-% IV SOLN
20.0000 mg | Freq: Two times a day (BID) | INTRAVENOUS | Status: DC
  Administered 2020-07-01 – 2020-07-06 (×10): 20 mg via INTRAVENOUS
  Filled 2020-07-01 (×10): qty 50

## 2020-07-01 MED ORDER — VALPROATE SODIUM 500 MG/5ML IV SOLN
2000.0000 mg | Freq: Once | INTRAVENOUS | Status: AC
  Administered 2020-07-01: 2000 mg via INTRAVENOUS
  Filled 2020-07-01: qty 20

## 2020-07-01 MED ORDER — CHLORHEXIDINE GLUCONATE 0.12% ORAL RINSE (MEDLINE KIT)
15.0000 mL | Freq: Two times a day (BID) | OROMUCOSAL | Status: DC
  Administered 2020-07-01 – 2020-07-02 (×2): 15 mL via OROMUCOSAL

## 2020-07-01 NOTE — ED Notes (Signed)
Dylan Moon, mom, [phone] would like an update when available

## 2020-07-01 NOTE — ED Notes (Addendum)
Emmy, RN pulled 4 mg of Ativan from pyxis floor stock.  Marisue Ivan, RN received verbal order from Dr. Rodena Medin to give 2mg  Ativan due to pt seizure activity.

## 2020-07-01 NOTE — ED Notes (Signed)
Receipt for patients home medications taken to pharmacy, Aption 800mg  (10 pills were counted and signed of by this RN and pt mother. ) to be able to be dispensed. If hospital needs more, please contact pt mother and she will provide.

## 2020-07-01 NOTE — Progress Notes (Signed)
Delayed note LTM EEG hooked up and running - no initial skin breakdown - push button tested - neuro notified.

## 2020-07-01 NOTE — ED Triage Notes (Signed)
Pt bib EMS for seizure activity. Pt was getting in the shower when he started seizing and fell between the toilet and sink. Girlfriend found pt and called EMS. 2mg  of versed IM was administered. Pt then came out of seizure. While in the ambulance, ems reports another seizure occurrence with sinus tach noted. A 3rd seizure occurred and 2mg  of ativan was administered.   Abrasion to the right arm reported.   Among arrival pt began to seize for a 4th time with EMS present.   Vitals: BP: 184/70 HR: 140 RR: 20 O2: 78% NRB CBG: 119

## 2020-07-01 NOTE — Progress Notes (Signed)
ETT pulled back 1cm.  Pt transported from ED31 to CT2 and back without any complications.

## 2020-07-01 NOTE — Procedures (Addendum)
Patient Name: Dylan Moon  MRN: [id number]  Epilepsy Attending: Charlsie Quest  Referring Physician/Provider: Dr Nilda Simmer Date: 07/01/2020 Duration: 27.14 mins  Patient history: 24yo M with h/o epilepsy who presented with breakthrough seizure. EEG to evaluate for seizure  Level of alertness:  Comatose  AEDs during EEG study: VPA, versed, propofol  Technical aspects: This EEG study was done with scalp electrodes positioned according to the 10-20 International system of electrode placement. Electrical activity was acquired at a sampling rate of 500Hz  and reviewed with a high frequency filter of 70Hz  and a low frequency filter of 1Hz . EEG data were recorded continuously and digitally stored.   Description:  EEG showed continuous generalized 2-3Hz  delta slowing with overriding 15 to 18 Hz beta activity distributed symmetrically and diffusely.  Hyperventilation and photic stimulation were not performed.     ABNORMALITY -Continuous slow, generalized   IMPRESSION: This study is suggestive of severe diffuse encephalopathy, nonspecific etiology but likely related to sedation. No seizures or epileptiform discharges were seen throughout the recording.   [name] 

## 2020-07-01 NOTE — Progress Notes (Signed)
eLink Physician-Brief Progress Note Patient Name: Dylan Moon DOB: 1996-03-01 MRN: [id number]   Date of Service  07/01/2020  HPI/Events of Note  Patient's versed order has a ceiling of 10 mg / hour but he came from the ED on 15 mg / hour.  eICU Interventions  Versed ceiling increased to 15 mg / hour to reflect what he is actually on.        Dylan Moon 07/01/2020, 11:27 PM

## 2020-07-01 NOTE — ED Notes (Signed)
This RN received verbal order from Dr. Rodena Medin to give pt 2mg  of Ativan due to seizure activity

## 2020-07-01 NOTE — Progress Notes (Signed)
eLink Physician-Brief Progress Note Patient Name: Dylan Moon DOB: Mar 30, 1996 MRN: [id number]   Date of Service  07/01/2020  HPI/Events of Note  Patient admitted earlier toady with status epilepticus and intubated for airway protection and acute respiratory failure.  eICU Interventions  New Patient Evaluation completed.        Dylan Moon 07/01/2020, 11:22 PM

## 2020-07-01 NOTE — Progress Notes (Signed)
Delayed note EEG complete - results pending. 

## 2020-07-01 NOTE — ED Notes (Signed)
Dr Dareen Piano notified pt not properly sedated continues to buck vent, orders to be written.
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
## 2020-07-01 NOTE — Progress Notes (Signed)
Patient transported from ED Room 31 to 4N32 with no complications noted.

## 2020-07-01 NOTE — Progress Notes (Signed)
eLink Physician-Brief Progress Note Patient Name: Dylan Moon DOB: 1995-12-11 MRN: [id number]   Date of Service  07/01/2020  HPI/Events of Note  Patient was intubated for status epilepticus and is awaiting bed placement in the ED, he has sub-optimal sedation on Propofol and Versed.  eICU Interventions  Order for PRN Fentanyl entered.        Thomasene Lot Tyanne Derocher 07/01/2020, 9:13 PM

## 2020-07-01 NOTE — ED Provider Notes (Signed)
[facility] EMERGENCY DEPARTMENT Provider Note   CSN: [id number] Arrival date & time: 07/01/20  9371     History Chief Complaint  Patient presents with  . Seizures    Dylan Moon is a 24 y.o. male.  24 year old male with prior medical history as detailed below presents for evaluation.  Patient with history of seizures.  Patient with history of status epilepticus.  Patient with approximately 30 minutes of continuous seizure prior to arrival.  Per EMS he already received Versed and Ativan without improvement in his seizure.  Patient continues to actively seize on arrival.  Patient was given additional doses of Ativan upon arrival to the ED.  Seizures continued.  Patient developed sonorous breath sounds.  Patient was intubated at that time for airway protection.  Propofol drip initiated.  With propofol drip, physical seizure activity ceased.  Patient's mother presented shortly after the patient.  She does report history of noncompliance.  Patient with prior episodes of status requiring intubation and deep sedation.   The history is provided by the patient, medical records, a relative and the EMS personnel.  Seizures Seizure activity on arrival: yes   Seizure type:  Tonic Initial focality:  Diffuse Episode characteristics: incontinence, stiffening, tongue biting and unresponsiveness   Return to baseline: no   Severity:  Severe Duration:  30 minutes Timing:  Clustered Progression:  Unchanged Context: medical non-compliance        Past Medical History:  Diagnosis Date  . ADHD   . Scoliosis   . Seizures (HCC)    most recent 12/02/17    Patient Active Problem List   Diagnosis Date Noted  . Family discord 03/15/2020  . Aggressive behavior 03/15/2020  . HCAP (healthcare-associated pneumonia) 12/27/2019  . Status epilepticus (HCC) 12/24/2019  . Acute respiratory failure with hypoxemia (HCC)   . Seizures (HCC) 07/10/2018  . 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 Noncompliance with medication regimen 12/11/2017  . Seizure (HCC) 05/24/2016  . Dehydration, mild 05/24/2016  . AKI (acute kidney injury) (HCC) 05/24/2016  . Marijuana use 05/24/2016    Past Surgical History:  Procedure Laterality Date  . NO PAST SURGERIES         Family History  Problem Relation Age of Onset  . Diabetes Mother   . Hypertension Mother   . Cancer Other   . Diabetes Father   . Seizures Maternal Grandfather     Social History   Tobacco Use  . Smoking status: Never Smoker  . Smokeless tobacco: Never Used  Vaping Use  . Vaping Use: Never used  Substance Use Topics  . Alcohol use: No  . Drug use: Yes    Types: Marijuana    Comment: Daily use of marijuana.    Home Medications Prior to Admission medications   Medication Sig Start Date End Date Taking? Authorizing Provider  APTIOM 800 MG TABS Take 1,200 mg by mouth daily. Taking 1.5 tablets = 1200mg  01/01/20   03/02/20, DO  busPIRone (BUSPAR) 7.5 MG tablet Take 7.5 mg by mouth 2 (two) times daily. 01/19/20 01/18/21  [provider]  divalproex (DEPAKOTE ER) 500 MG 24 hr tablet Take 3,000 mg by mouth daily. 01/04/20   [provider]  escitalopram (LEXAPRO) 5 MG tablet Take 5 mg by mouth daily. 03/08/20   [provider]  Vitamin D, Ergocalciferol, (DRISDOL) 1.25 MG (50000 UNIT) CAPS capsule Take 50,000 Units by mouth once a week. 01/24/20  [provider]    Allergies    Depakene [divalproex sodium], Keppra [levetiracetam], Tramadol, and Vimpat [lacosamide]  Review of Systems   Review of Systems  Neurological: Positive for seizures.  All other systems reviewed and are negative.   Physical Exam Updated Vital Signs Ht 6' (1.829 m)   Wt 99.8 kg   BMI 29.84 kg/m   Physical Exam Vitals and nursing note reviewed.  Constitutional:      General: He is in acute distress.     Appearance: He is well-developed. He is  ill-appearing.     Comments: Actively seizing  HENT:     Head: Normocephalic and atraumatic.  Eyes:     Conjunctiva/sclera: Conjunctivae normal.  Cardiovascular:     Rate and Rhythm: Regular rhythm. Tachycardia present.     Heart sounds: Normal heart sounds.  Pulmonary:     Effort: Pulmonary effort is normal. No respiratory distress.     Breath sounds: Normal breath sounds.  Abdominal:     General: There is no distension.     Palpations: Abdomen is soft.     Tenderness: There is no abdominal tenderness.  Musculoskeletal:        General: No deformity.     Cervical back: Neck supple.  Skin:    General: Skin is warm and dry.  Neurological:     Mental Status: He is alert.     Comments: Actively seizing     ED Results / Procedures / Treatments   Labs (all labs ordered are listed, but only abnormal results are displayed) Labs Reviewed  COMPREHENSIVE METABOLIC PANEL - Abnormal; Notable for the following components:      Result Value   CO2 8 (*)    Glucose, Bld 218 (*)    Creatinine, Ser 1.50 (*)    Anion gap 33 (*)    All other components within normal limits  CBC WITH DIFFERENTIAL/PLATELET - Abnormal; Notable for the following components:   WBC 15.8 (*)    HCT 57.8 (*)    MCHC 28.9 (*)    Lymphs Abs 10.1 (*)    Monocytes Absolute 1.8 (*)    Abs Immature Granulocytes 0.20 (*)    All other components within normal limits  I-STAT ARTERIAL BLOOD GAS, ED - Abnormal; Notable for the following components:   pH, Arterial 6.828 (*)    pCO2 arterial 71.1 (*)    pO2, Arterial 82 (*)    Bicarbonate 11.8 (*)    TCO2 14 (*)    Acid-base deficit 24.0 (*)    HCT 54.0 (*)    Hemoglobin 18.4 (*)    All other components within normal limits  CBG MONITORING, ED - Abnormal; Notable for the following components:   Glucose-Capillary 213 (*)    All other components within normal limits  I-STAT ARTERIAL BLOOD GAS, ED - Abnormal; Notable for the following components:   pH, Arterial 7.248  (*)    pO2, Arterial 192 (*)    Acid-base deficit 7.0 (*)    All other components within normal limits  RESPIRATORY PANEL BY RT PCR (FLU A&B, COVID)  ETHANOL  PROTIME-INR  RAPID URINE DRUG SCREEN, HOSP PERFORMED  URINALYSIS, ROUTINE W REFLEX MICROSCOPIC  VALPROIC ACID LEVEL  MAGNESIUM  PATHOLOGIST SMEAR REVIEW  I-STAT CHEM 8, ED    EKG None  Radiology DG Chest Port 1 View  Result Date: 07/01/2020 CLINICAL DATA:  Seizure. EXAM: PORTABLE CHEST 1 VIEW COMPARISON:  Chest radiograph 12/27/2019. FINDINGS: ETT tip projects at  the carina, recommend retraction. Enteric tube tip and side-port project over the stomach. Monitoring leads overlie the patient. Stable cardiac and mediastinal contours. Low lung volumes. Bibasilar heterogeneous opacities. No pleural effusion or pneumothorax. Thoracic spine degenerative changes. IMPRESSION: ET tube terminates at the carina, recommend retraction. Low lung volumes with bibasilar opacities favored represent atelectasis. These results will be called to the ordering clinician or representative by the Radiologist Assistant, and communication documented in the PACS or [employer]. Electronically Signed   By: Annia Belt M.D.   On: 07/01/2020 12:02   DG Abd Portable 1 View  Result Date: 07/01/2020 CLINICAL DATA:  Verify OG tube placement. EXAM: PORTABLE ABDOMEN - 1 VIEW COMPARISON:  Chest radiograph-earlier same day FINDINGS: When correlated with preceding chest radiograph, enteric tube tip and side port project over the expected location of the gastric fundus. Paucity of bowel gas without evidence of enteric obstruction Nondiagnostic evaluation for pneumoperitoneum secondary to supine positioning and exclusion of the lower thorax. No pneumatosis or portal venous gas. No acute osseous abnormalities IMPRESSION: Enteric tube tip and side port project over the expected location the gastric fundus Electronically Signed   By: Simonne Come M.D.   On: 07/01/2020 12:27     Procedures Procedures (including critical care time)  CRITICAL CARE Performed by: Wynetta Fines   Total critical care time: 30 minutes  Critical care time was exclusive of separately billable procedures and treating other patients.  Critical care was necessary to treat or prevent imminent or life-threatening deterioration.  Critical care was time spent personally by me on the following activities: development of treatment plan with patient and/or surrogate as well as nursing, discussions with consultants, evaluation of patient's response to treatment, examination of patient, obtaining history from patient or surrogate, ordering and performing treatments and interventions, ordering and review of laboratory studies, ordering and review of radiographic studies, pulse oximetry and re-evaluation of patient's condition.    Medications Ordered in ED Medications  propofol (DIPRIVAN) 1000 MG/100ML infusion (has no administration in time range)  propofol (DIPRIVAN) 1000 MG/100ML infusion (5 mcg/kg/min  99.8 kg Intravenous New Bag/Given 07/01/20 1023)  LORazepam (ATIVAN) 2 MG/ML injection (has no administration in time range)  valproic acid (DEPAKENE) 250 MG/5ML solution 1,000 mg (has no administration in time range)  midazolam (VERSED) 50 mg/50 mL (1 mg/mL) premix infusion (0.5 mg/hr Intravenous New Bag/Given 07/01/20 1212)  NON FORMULARY 1,200 mg (has no administration in time range)  valproate (DEPACON) 2,000 mg in dextrose 5 % 50 mL IVPB (0 mg Intravenous Stopped 07/01/20 1210)    ED Course  I have reviewed the triage vital signs and the nursing notes.  Pertinent labs & imaging results that were available during my care of the patient were reviewed by me and considered in my medical decision making (see chart for details).    MDM Rules/Calculators/A&P                          MDM  Screen complete  Whitaker E Moede was evaluated in Emergency Department on 07/01/2020 for the  symptoms described in the history of present illness. He was evaluated in the context of the global COVID-19 pandemic, which necessitated consideration that the patient might be at risk for infection with the SARS-CoV-2 virus that causes COVID-19. Institutional protocols and algorithms that pertain to the evaluation of patients at risk for COVID-19 are in a state of rapid change based on information released by regulatory bodies  including the CDC and federal and state organizations. These policies and algorithms were followed during the patient's care in the ED.   Patient is presenting in status epilepticus.  Patient had approximately 30 minutes of continuous seizure activity prior to arrival.  Patient seizure activity was not controlled with medications alone.  Patient did develop sonorous breath sounds and was intubated for airway protection.  Propofol drip initiated in the ED.  Case discussed with neurology who will consult.  Case discussed with critical care who will evaluate for admission.   Final Clinical Impression(s) / ED Diagnoses Final diagnoses:  Seizures [facility])    Rx / DC Orders ED Discharge Orders    None       Wynetta Fines, MD 07/01/20 1237

## 2020-07-01 NOTE — ED Notes (Signed)
Paged admitting dr to [facility]

## 2020-07-01 NOTE — ED Notes (Signed)
Patient transported to CT w/ RN. 

## 2020-07-01 NOTE — H&P (Signed)
NAME:  Dylan Moon, MRN:  [id number], DOB:  [birth year], LOS: 0 ADMISSION DATE:  07/01/2020, CONSULTATION DATE:  07/01/2020 REFERRING MD:  ED, CHIEF COMPLAINT:  Persistent seizures   Brief History   This is a 24 year old with a history of seizure disorder and prior history of status who will began seizing this morning.  Apparently had 30 minutes of uninterrupted seizures before EMS arrived.  He was given benzodiazepines in transport but seizures did not resolve.  He was given further benzodiazepines on arrival in the department of emergency medicine however he subsequently became apneic and required intubation.  Despite being loaded with valproic acid and started on propofol he is still intermittently having seizures being treated with boluses of Versed. History of present illness   Have no one present to provide [place] with any history.  Appears that he has had episodes of status in the past related to noncompliance I know nothing of whether he had any concurrent illness fevers chills changes in medications or use of street drugs  Past Medical History  Epilepsy, prior status, anxiety, and ADHD.  Significant Hospital Events   Intubated in ER 10/9  Consults:  Neurology  Procedures:    Significant Diagnostic Tests:  TTE and EEG are pending.  Tox screen is positive for THC and benzodiazepines  Micro Data:    Antimicrobials:  None   Interim history/subjective:    Objective   Height 6' (1.829 m), weight 99.8 kg.    Vent Mode: PRVC FiO2 (%):  [50 %-100 %] 50 % Set Rate:  [20 bmp] 20 bmp Vt Set:  [620 mL] 620 mL PEEP:  [5 cmH20] 5 cmH20 Plateau Pressure:  [19 cmH20] 19 cmH20  No intake or output data in the 24 hours ending 07/01/20 1241 Filed Weights   07/01/20 1019  Weight: 99.8 kg    Examination: General: Fit appearing young man orally intubated and mechanically ventilated and unresponsive.  He is not seizing at the time of my examination. HENT: There is no meningismus,  there is no external indication of head trauma. Lungs: Respirations are unlabored there is symmetric air movement no wheezes no rhonchi Cardiovascular: 1 and S2 are regular without murmur rub or gallop there is no JVD there is no edema the limbs are warm Abdomen: Abdomen is soft without organomegaly masses tenderness guarding or rebound he is anicteric Extremities: Warm without edema Neuro: Response to voice or noxious stimuli, pupils are equal and reactive. GU:   Resolved Hospital Problem list     Assessment & Plan:  This is a 24 year old with a known seizure disorder who was previously suffered from status related to noncompliance.  Fortunately we have no history we do not know whether he had any concurrent illness sleep deprivation or other stressors.  We do not know anything about the use of street drugs but his tox screen screen negative.  Her awaiting a CT scan of the head to rule out a structural provocation.  To control his status he is currently being loaded with valproic acid, and he will be maintained on both propofol and Versed infusions.  Best practice:  Diet: N.p.o.  Pain/Anxiety/Delirium protocol (if indicated): Not indicated VAP protocol (if indicated): Ordered DVT prophylaxis: Lovenox GI prophylaxis: Pepcid Glucose control: Not indicated Mobility: Bedrest Code Status: Full code Family Communication: No family is currently available Disposition:   Labs   CBC: Recent Labs  Lab 07/01/20 1010 07/01/20 1025 07/01/20 1119  WBC  --  15.8*  --  NEUTROABS  --  3.4  --   HGB 18.4* 16.7 16.3  HCT 54.0* 57.8* 48.0  MCV  --  99.5  --   PLT  --  259  --     Basic Metabolic Panel: Recent Labs  Lab 07/01/20 1010 07/01/20 1025 07/01/20 1119  NA 138 144 138  K 3.7 4.5 4.9  CL  --  103  --   CO2  --  8*  --   GLUCOSE  --  218*  --   BUN  --  18  --   CREATININE  --  1.50*  --   CALCIUM  --  10.0  --    GFR: Estimated Creatinine Clearance: 92.1 mL/min (A) (by  C-G formula based on SCr of 1.5 mg/dL (H)). Recent Labs  Lab 07/01/20 1025  WBC 15.8*    Liver Function Tests: Recent Labs  Lab 07/01/20 1025  AST 40  ALT 19  ALKPHOS 43  BILITOT 1.1  PROT 7.9  ALBUMIN 4.9   No results for input(s): LIPASE, AMYLASE in the last 168 hours. No results for input(s): AMMONIA in the last 168 hours.  ABG    Component Value Date/Time   PHART 7.248 (L) 07/01/2020 1119   PCO2ART 46.3 07/01/2020 1119   PO2ART 192 (H) 07/01/2020 1119   HCO3 20.2 07/01/2020 1119   TCO2 22 07/01/2020 1119   ACIDBASEDEF 7.0 (H) 07/01/2020 1119   O2SAT 99.0 07/01/2020 1119     Coagulation Profile: Recent Labs  Lab 07/01/20 1025  INR 1.2    Cardiac Enzymes: No results for input(s): CKTOTAL, CKMB, CKMBINDEX, TROPONINI in the last 168 hours.  HbA1C: Hgb A1c MFr Bld  Date/Time Value Ref Range Status  12/24/2019 05:13 PM 5.8 (H) 4.8 - 5.6 % Final    Comment:    (NOTE) Pre diabetes:          5.7%-6.4% Diabetes:              >6.4% Glycemic control for   <7.0% adults with diabetes     CBG: Recent Labs  Lab 07/01/20 1002  GLUCAP 213*    Review of Systems:    Past Medical History  He,  has a past medical history of ADHD, Scoliosis, and Seizures (HCC).   Surgical History    Past Surgical History:  Procedure Laterality Date  . NO PAST SURGERIES       Social History   reports that he has never smoked. He has never used smokeless tobacco. He reports current drug use. Drug: Marijuana. He reports that he does not drink alcohol.   Family History   His family history includes Cancer in an other family member; Diabetes in his father and mother; Hypertension in his mother; Seizures in his maternal grandfather.   Allergies Allergies  Allergen Reactions  . Depakene [Divalproex Sodium] Other (See Comments)    Per mother, pt was unable to take -- pill too big and made him sick  . Keppra [Levetiracetam] Other (See Comments)    Irritability   . Tramadol  Other (See Comments)    Contraindicated with current medications (??)  . Vimpat [Lacosamide] Other (See Comments)    Causes anger     Home Medications  Prior to Admission medications   Medication Sig Start Date End Date Taking? Authorizing Provider  APTIOM 800 MG TABS Take 1,200 mg by mouth daily. Taking 1.5 tablets = 1200mg  01/01/20   03/02/20, DO  busPIRone (BUSPAR) 7.5 MG tablet  Take 7.5 mg by mouth 2 (two) times daily. 01/19/20 01/18/21  [provider]  divalproex (DEPAKOTE ER) 500 MG 24 hr tablet Take 3,000 mg by mouth daily. 01/04/20   [provider]  escitalopram (LEXAPRO) 5 MG tablet Take 5 mg by mouth daily. 03/08/20   [provider]  Vitamin D, Ergocalciferol, (DRISDOL) 1.25 MG (50000 UNIT) CAPS capsule Take 50,000 Units by mouth once a week. 01/24/20   [provider]     Critical care time: 33 minutes    Penny Pia, MD

## 2020-07-01 NOTE — Code Documentation (Signed)
23 @ lip with color change

## 2020-07-02 DIAGNOSIS — G40301 Generalized idiopathic epilepsy and epileptic syndromes, not intractable, with status epilepticus: Secondary | ICD-10-CM | POA: Diagnosis not present

## 2020-07-02 DIAGNOSIS — R569 Unspecified convulsions: Secondary | ICD-10-CM | POA: Diagnosis not present

## 2020-07-02 DIAGNOSIS — G40901 Epilepsy, unspecified, not intractable, with status epilepticus: Secondary | ICD-10-CM | POA: Diagnosis not present

## 2020-07-02 LAB — MAGNESIUM: Magnesium: 2.3 mg/dL (ref 1.7–2.4)

## 2020-07-02 LAB — TRIGLYCERIDES: Triglycerides: 283 mg/dL — ABNORMAL HIGH (ref <150)

## 2020-07-02 LAB — CBC WITH DIFFERENTIAL/PLATELET
Abs Immature Granulocytes: 0.03 10*3/uL (ref 0.00–0.07)
Basophils Absolute: 0 10*3/uL (ref 0.0–0.1)
Basophils Relative: 0 %
Eosinophils Absolute: 0 10*3/uL (ref 0.0–0.5)
Eosinophils Relative: 0 %
HCT: 44.1 % (ref 39.0–52.0)
Hemoglobin: 14.7 g/dL (ref 13.0–17.0)
Immature Granulocytes: 0 %
Lymphocytes Relative: 20 %
Lymphs Abs: 2.1 10*3/uL (ref 0.7–4.0)
MCH: 29.4 pg (ref 26.0–34.0)
MCHC: 33.3 g/dL (ref 30.0–36.0)
MCV: 88.2 fL (ref 80.0–100.0)
Monocytes Absolute: 1.2 10*3/uL — ABNORMAL HIGH (ref 0.1–1.0)
Monocytes Relative: 11 %
Neutro Abs: 7.2 10*3/uL (ref 1.7–7.7)
Neutrophils Relative %: 69 %
Platelets: 196 10*3/uL (ref 150–400)
RBC: 5 MIL/uL (ref 4.22–5.81)
RDW: 13.7 % (ref 11.5–15.5)
WBC: 10.5 10*3/uL (ref 4.0–10.5)
nRBC: 0 % (ref 0.0–0.2)

## 2020-07-02 LAB — POCT I-STAT 7, (LYTES, BLD GAS, ICA,H+H)
Acid-Base Excess: 2 mmol/L (ref 0.0–2.0)
Bicarbonate: 25.6 mmol/L (ref 20.0–28.0)
Calcium, Ion: 1.18 mmol/L (ref 1.15–1.40)
HCT: 45 % (ref 39.0–52.0)
Hemoglobin: 15.3 g/dL (ref 13.0–17.0)
O2 Saturation: 99 %
Patient temperature: 98.6
Potassium: 3.9 mmol/L (ref 3.5–5.1)
Sodium: 141 mmol/L (ref 135–145)
TCO2: 27 mmol/L (ref 22–32)
pCO2 arterial: 35.2 mmHg (ref 32.0–48.0)
pH, Arterial: 7.47 — ABNORMAL HIGH (ref 7.350–7.450)
pO2, Arterial: 140 mmHg — ABNORMAL HIGH (ref 83.0–108.0)

## 2020-07-02 LAB — GLUCOSE, CAPILLARY: Glucose-Capillary: 95 mg/dL (ref 70–99)

## 2020-07-02 LAB — BASIC METABOLIC PANEL
Anion gap: 12 (ref 5–15)
BUN: 13 mg/dL (ref 6–20)
CO2: 22 mmol/L (ref 22–32)
Calcium: 8.9 mg/dL (ref 8.9–10.3)
Chloride: 106 mmol/L (ref 98–111)
Creatinine, Ser: 1.21 mg/dL (ref 0.61–1.24)
GFR, Estimated: >60 mL/min (ref >60)
Glucose, Bld: 104 mg/dL — ABNORMAL HIGH (ref 70–99)
Potassium: 4 mmol/L (ref 3.5–5.1)
Sodium: 140 mmol/L (ref 135–145)

## 2020-07-02 LAB — URINALYSIS, ROUTINE W REFLEX MICROSCOPIC
Bilirubin Urine: NEGATIVE
Glucose, UA: NEGATIVE mg/dL
Hgb urine dipstick: NEGATIVE
Ketones, ur: NEGATIVE mg/dL
Leukocytes,Ua: NEGATIVE
Nitrite: NEGATIVE
Protein, ur: NEGATIVE mg/dL
Specific Gravity, Urine: 1.033 — ABNORMAL HIGH (ref 1.005–1.030)
pH: 5 (ref 5.0–8.0)

## 2020-07-02 LAB — RAPID URINE DRUG SCREEN, HOSP PERFORMED
Amphetamines: NOT DETECTED
Barbiturates: NOT DETECTED
Benzodiazepines: POSITIVE — AB
Cocaine: NOT DETECTED
Opiates: NOT DETECTED
Tetrahydrocannabinol: POSITIVE — AB

## 2020-07-02 LAB — MRSA PCR SCREENING: MRSA by PCR: POSITIVE — AB

## 2020-07-02 LAB — VALPROIC ACID LEVEL: Valproic Acid Lvl: 122 ug/mL — ABNORMAL HIGH (ref 50.0–100.0)

## 2020-07-02 LAB — PROTIME-INR
INR: 1.2 (ref 0.8–1.2)
Prothrombin Time: 14.5 seconds (ref 11.4–15.2)

## 2020-07-02 LAB — CK: Total CK: 886 U/L — ABNORMAL HIGH (ref 49–397)

## 2020-07-02 MED ORDER — ORAL CARE MOUTH RINSE
15.0000 mL | OROMUCOSAL | Status: DC
  Administered 2020-07-02 – 2020-07-05 (×30): 15 mL via OROMUCOSAL

## 2020-07-02 MED ORDER — MUPIROCIN 2 % EX OINT
1.0000 "application " | TOPICAL_OINTMENT | Freq: Two times a day (BID) | CUTANEOUS | Status: AC
  Administered 2020-07-02 – 2020-07-06 (×10): 1 via NASAL
  Filled 2020-07-02: qty 22

## 2020-07-02 MED ORDER — FENTANYL 2500MCG IN NS 250ML (10MCG/ML) PREMIX INFUSION
100.0000 ug/h | INTRAVENOUS | Status: DC
  Administered 2020-07-02 – 2020-07-04 (×3): 100 ug/h via INTRAVENOUS
  Filled 2020-07-02 (×3): qty 250

## 2020-07-02 MED ORDER — CHLORHEXIDINE GLUCONATE 0.12% ORAL RINSE (MEDLINE KIT)
15.0000 mL | Freq: Two times a day (BID) | OROMUCOSAL | Status: DC
  Administered 2020-07-02 – 2020-07-04 (×5): 15 mL via OROMUCOSAL

## 2020-07-02 NOTE — Progress Notes (Signed)
NAME:  Dylan Moon, MRN:  [id number], DOB:  08-05-1996, LOS: 1 ADMISSION DATE:  07/01/2020, CONSULTATION DATE:  07/01/2020 REFERRING MD:  ED, CHIEF COMPLAINT:  Persistent seizures   Brief History   This is a 24 year old with a history of seizure disorder and prior history of status who will began seizing this morning.  Apparently had 30 minutes of uninterrupted seizures before EMS arrived.  He was given benzodiazepines in transport but seizures did not resolve.  He was given further benzodiazepines on arrival in the department of emergency medicine however he subsequently became apneic and required intubation.  Despite being loaded with valproic acid and started on propofol he is still intermittently having seizures being treated with boluses of Versed. History of present illness   Have no one present to provide [place] with any history.  Appears that he has had episodes of status in the past related to noncompliance I know nothing of whether he had any concurrent illness fevers chills changes in medications or use of street drugs  Past Medical History  Epilepsy, prior status, anxiety, and ADHD.  Significant Hospital Events   Intubated in ER 10/9  Consults:  Neurology  Procedures:  None  Significant Diagnostic Tests:  CT Head 10/9 >> Negative for intracranial abnormalities   cEEG 10/9 - 10/10 >> Negative for seizures or epileptiform discharges. Severe diffuse encephalopathy.  Micro Data:  None   Antimicrobials:  None   Interim history/subjective:   No overnight events. cEEG negative. Heavily sedated on examination.   Objective   Blood pressure (!) 146/84, pulse 72, temperature 98.9 F (37.2 C), temperature source Axillary, resp. rate 15, height 6' (1.829 m), weight 90.9 kg, SpO2 100 %.    Vent Mode: PRVC FiO2 (%):  [40 %-100 %] 40 % Set Rate:  [15 bmp-20 bmp] 15 bmp Vt Set:  [620 mL] 620 mL PEEP:  [5 cmH20] 5 cmH20 Plateau Pressure:  [16 cmH20-19 cmH20] 19 cmH20    Intake/Output Summary (Last 24 hours) at 07/02/2020 1013 Last data filed at 07/02/2020 0600 Gross per 24 hour  Intake 878.42 ml  Output 1600 ml  Net -721.58 ml   Filed Weights   07/01/20 1019 07/01/20 2200  Weight: 99.8 kg 90.9 kg   Physical Exam Vitals and nursing note reviewed.  Constitutional:      Appearance: He is normal weight.     Interventions: He is sedated and restrained.  HENT:     Head: Normocephalic and atraumatic.  Cardiovascular:     Rate and Rhythm: Normal rate and regular rhythm.     Heart sounds: No murmur heard.   Pulmonary:     Effort: Pulmonary effort is normal. No respiratory distress.     Breath sounds: No wheezing, rhonchi or rales.  Abdominal:     General: Bowel sounds are normal. There is no distension.  Musculoskeletal:     Right lower leg: No edema.     Left lower leg: No edema.  Skin:    General: Skin is warm and dry.  Neurological:     Comments: Sedated on maximum Propofol and Versed ggt. cEEG running. Neuro exam deferred.     Resolved Hospital Problem list     Assessment & Plan:   # Breakthrough Seizures complicated by Status Epilepticus # Hx of Seizures  - Hx of frequent breakthrough seizures secondary to medication non-compliance - UDS also positive for [facility] - Neurology is following; appreciate their recommendations  - On admission, Valproic Acid was continued 1000  mg TID, with the addition of home medication Aptiom, brought by patient's mother - Started on Propofol gtt with continuation of Versed as well - cEEG overnight has been negative for any additional seizures or epileptiform discharges.  - Will follow up Neurology's recommendations on weaning of sedation.  # Acute Hypoxic Respiratory Failure secondary to Acute Encephalopathy - Intubated for airway protection - Full vent support  - VAP measures in place - Consider SBT when sedation has been weaned and patient able to follow commands.   Best practice:  Diet: N.p.o.   Pain/Anxiety/Delirium protocol (if indicated): Not indicated VAP protocol (if indicated): Ordered DVT prophylaxis: Lovenox GI prophylaxis: Pepcid Glucose control: Not indicated Mobility: Bedrest Code Status: Full code Family Communication: Will update mother  Disposition: ICU  Labs   CBC: Recent Labs  Lab 07/01/20 1025 07/01/20 1119 07/01/20 1817 07/02/20 0233 07/02/20 0316  WBC 15.8*  --   --   --  10.5  NEUTROABS 3.4  --   --   --  7.2  HGB 16.7 16.3 15.3 15.3 14.7  HCT 57.8* 48.0 45.0 45.0 44.1  MCV 99.5  --   --   --  88.2  PLT 259  --   --   --  196    Basic Metabolic Panel: Recent Labs  Lab 07/01/20 1025 07/01/20 1119 07/01/20 1817 07/02/20 0233 07/02/20 0316  NA 144 138 140 141 140  K 4.5 4.9 3.8 3.9 4.0  CL 103  --   --   --  106  CO2 8*  --   --   --  22  GLUCOSE 218*  --   --   --  104*  BUN 18  --   --   --  13  CREATININE 1.50*  --   --   --  1.21  CALCIUM 10.0  --   --   --  8.9  MG 2.3  --   --   --  2.3   GFR: Estimated Creatinine Clearance: 102.4 mL/min (by C-G formula based on SCr of 1.21 mg/dL). Recent Labs  Lab 07/01/20 1025 07/02/20 0316  WBC 15.8* 10.5    Liver Function Tests: Recent Labs  Lab 07/01/20 1025  AST 40  ALT 19  ALKPHOS 43  BILITOT 1.1  PROT 7.9  ALBUMIN 4.9   No results for input(s): LIPASE, AMYLASE in the last 168 hours. No results for input(s): AMMONIA in the last 168 hours.  ABG    Component Value Date/Time   PHART 7.470 (H) 07/02/2020 0233   PCO2ART 35.2 07/02/2020 0233   PO2ART 140 (H) 07/02/2020 0233   HCO3 25.6 07/02/2020 0233   TCO2 27 07/02/2020 0233   ACIDBASEDEF 7.0 (H) 07/01/2020 1119   O2SAT 99.0 07/02/2020 0233     Coagulation Profile: Recent Labs  Lab 07/01/20 1025  INR 1.2    Cardiac Enzymes: Recent Labs  Lab 07/02/20 0538  CKTOTAL 886*    HbA1C: Hgb A1c MFr Bld  Date/Time Value Ref Range Status  12/24/2019 05:13 PM 5.8 (H) 4.8 - 5.6 % Final    Comment:    (NOTE) Pre  diabetes:          5.7%-6.4% Diabetes:              >6.4% Glycemic control for   <7.0% adults with diabetes     CBG: Recent Labs  Lab 07/01/20 1002  GLUCAP 213*    Review of Systems:    Past Medical  History  He,  has a past medical history of ADHD, Scoliosis, and Seizures (HCC).   Surgical History    Past Surgical History:  Procedure Laterality Date  . NO PAST SURGERIES       Social History   reports that he has never smoked. He has never used smokeless tobacco. He reports current drug use. Drug: Marijuana. He reports that he does not drink alcohol.   Family History   His family history includes Cancer in an other family member; Diabetes in his father and mother; Hypertension in his mother; Seizures in his maternal grandfather.   Allergies Allergies  Allergen Reactions  . Keppra [Levetiracetam] Other (See Comments)    Irritability   . Tramadol Other (See Comments)    Contraindicated with current medications (??)  . Vimpat [Lacosamide] Other (See Comments)    Causes anger     Home Medications  Prior to Admission medications   Medication Sig Start Date End Date Taking? Authorizing Provider  APTIOM 800 MG TABS Take 1,200 mg by mouth daily. Taking 1.5 tablets = 1200mg  01/01/20   03/02/20, DO  busPIRone (BUSPAR) 7.5 MG tablet Take 7.5 mg by mouth 2 (two) times daily. 01/19/20 01/18/21  [provider]  divalproex (DEPAKOTE ER) 500 MG 24 hr tablet Take 3,000 mg by mouth daily. 01/04/20   [provider]  escitalopram (LEXAPRO) 5 MG tablet Take 5 mg by mouth daily. 03/08/20   [provider]  Vitamin D, Ergocalciferol, (DRISDOL) 1.25 MG (50000 UNIT) CAPS capsule Take 50,000 Units by mouth once a week. 01/24/20   [provider]     Dr. 03/25/20 Internal Medicine PGY-2  Pager: [phone]  07/02/2020, 10:22 AM

## 2020-07-02 NOTE — Progress Notes (Signed)
LTM maint complete - no skin breakdown under:  fp2 f4 f8 a2  

## 2020-07-02 NOTE — Progress Notes (Addendum)
Subjective: No clinical seizures overnight. Remains on propofol at 50 mcg/kg/min and Versed 15 mg/h currently. Plan d/w RN to slowly wean off sedation today as we con't to monitor on LTM EEG.   Objective: Current vital signs: BP (!) 146/84 (BP Location: Right Arm)   Pulse 72   Temp 98.9 F (37.2 C) (Axillary)   Resp 15   Ht 6' (1.829 m)   Wt 90.9 kg   SpO2 100%   BMI 27.18 kg/m  Vital signs in last 24 hours: Temp:  [98.9 F (37.2 C)-101.1 F (38.4 C)] 98.9 F (37.2 C) (10/10 0800) Pulse Rate:  [72-108] 72 (10/10 0800) Resp:  [3-27] 15 (10/10 0800) BP: (97-155)/(51-105) 146/84 (10/10 0800) SpO2:  [98 %-100 %] 100 % (10/10 0800) FiO2 (%):  [40 %-50 %] 40 % (10/10 0800) Weight:  [90.9 kg] 90.9 kg (10/09 2200)  Intake/Output from previous day: 10/09 0701 - 10/10 0700 In: 878.4 [I.V.:828.4; IV Piggyback:50] Out: 1600 [Urine:1600] Intake/Output this shift: No intake/output data recorded. Nutritional status:  Diet Order    None     Physical Exam  HEENT-  [place]/AT. No nuchal rigidity. EEG leads are in place  Lungs- Intubated, vented, lungs sound clear Extremities- No edema  Neurological Examination Performed while on sedation with propofol and Versed gtt.  Mental Status: Intubated and sedated. Will move both arms off bed slowly in response to sternal rub. Withdraws BLE weakly to plantar stimulation. Otherwise no volitional or semi-volitional movements. Not following commands or arousing to voice or loud clap. No attempts to communicate.  Cranial Nerves: II: No blink to threat. Pupils are pinpoint bilaterally.  III,IV, VI: No doll's eye reflex. Eyes are midline. No nystagmus.  V,VII: No response to eyelid stimulation. Face flaccidly symmetric.  VIII: No response to voice IX,X: Intubated XI: Head is midline XII: Intubated Motor/Sensory: Flaccid tone x 4.Will move both arms off bed slowly in response to sternal rub. Withdraws BLE weakly to plantar stimulation. Otherwise no  volitional or semi-volitional movements. Deep Tendon Reflexes: Hypoactive throughout (sedated on propofol and Versed) Plantars: Upgoing bilaterally  Cerebellar/Gait: Unable to assess  Lab Results: Results for orders placed or performed during the hospital encounter of 07/01/20 (from the past 48 hour(s))  CBG monitoring, ED     Status: Abnormal   Collection Time: 07/01/20 10:02 AM  Result Value Ref Range   Glucose-Capillary 213 (H) 70 - 99 mg/dL    Comment: Glucose reference range applies only to samples taken after fasting for at least 8 hours.  I-Stat arterial blood gas, [facility] ED only)     Status: Abnormal   Collection Time: 07/01/20 10:10 AM  Result Value Ref Range   pH, Arterial 6.828 (LL) 7.35 - 7.45   pCO2 arterial 71.1 (HH) 32 - 48 mmHg   pO2, Arterial 82 (L) 83 - 108 mmHg   Bicarbonate 11.8 (L) 20.0 - 28.0 mmol/L   TCO2 14 (L) 22 - 32 mmol/L   O2 Saturation 81.0 %   Acid-base deficit 24.0 (H) 0.0 - 2.0 mmol/L   Sodium 138 135 - 145 mmol/L   Potassium 3.7 3.5 - 5.1 mmol/L   Calcium, Ion 1.25 1.15 - 1.40 mmol/L   HCT 54.0 (H) 39 - 52 %   Hemoglobin 18.4 (H) 13.0 - 17.0 g/dL   Sample type ARTERIAL   Comprehensive metabolic panel     Status: Abnormal   Collection Time: 07/01/20 10:25 AM  Result Value Ref Range   Sodium 144 135 - 145 mmol/L  Potassium 4.5 3.5 - 5.1 mmol/L    Comment: HEMOLYSIS AT THIS LEVEL MAY AFFECT RESULT   Chloride 103 98 - 111 mmol/L   CO2 8 (L) 22 - 32 mmol/L   Glucose, Bld 218 (H) 70 - 99 mg/dL    Comment: Glucose reference range applies only to samples taken after fasting for at least 8 hours.   BUN 18 6 - 20 mg/dL   Creatinine, Ser 1.50 (H) 0.61 - 1.24 mg/dL   Calcium 10.0 8.9 - 10.3 mg/dL   Total Protein 7.9 6.5 - 8.1 g/dL   Albumin 4.9 3.5 - 5.0 g/dL   AST 40 15 - 41 U/L   ALT 19 0 - 44 U/L   Alkaline Phosphatase 43 38 - 126 U/L   Total Bilirubin 1.1 0.3 - 1.2 mg/dL   GFR, Estimated >60 >60 mL/min   Anion gap 33 (H) 5 - 15    Comment:  Performed at [facility] [address] [place], [place] [address]  Ethanol     Status: None   Collection Time: 07/01/20 10:25 AM  Result Value Ref Range   Alcohol, Ethyl (B) <10 <10 mg/dL    Comment: (NOTE) Lowest detectable limit for serum alcohol is 10 mg/dL.  For medical purposes only. Performed at [facility], [place] [address], [place], [place] [address]   Protime-INR     Status: None   Collection Time: 07/01/20 10:25 AM  Result Value Ref Range   Prothrombin Time 14.5 11.4 - 15.2 seconds   INR 1.2 0.8 - 1.2    Comment: (NOTE) INR goal varies based on device and disease states. Performed at [facility], [place] [address] [place], [place] [address]   CBC with Differential     Status: Abnormal   Collection Time: 07/01/20 10:25 AM  Result Value Ref Range   WBC 15.8 (H) 4.0 - 10.5 K/uL   RBC 5.81 4.22 - 5.81 MIL/uL   Hemoglobin 16.7 13.0 - 17.0 g/dL   HCT 57.8 (H) 39 - 52 %   MCV 99.5 80.0 - 100.0 fL   MCH 28.7 26.0 - 34.0 pg   MCHC 28.9 (L) 30.0 - 36.0 g/dL   RDW 13.2 11.5 - 15.5 %   Platelets 259 150 - 400 K/uL   nRBC 0.0 0.0 - 0.2 %   Neutrophils Relative % 22 %   Neutro Abs 3.4 1.7 - 7.7 K/uL   Lymphocytes Relative 63 %   Lymphs Abs 10.1 (H) 0.7 - 4.0 K/uL   Monocytes Relative 12 %   Monocytes Absolute 1.8 (H) 0.1 - 1.0 K/uL   Eosinophils Relative 1 %   Eosinophils Absolute 0.2 0 - 0 K/uL   Basophils Relative 1 %   Basophils Absolute 0.1 0 - 0 K/uL   Immature Granulocytes 1 %   Abs Immature Granulocytes 0.20 (H) 0.00 - 0.07 K/uL    Comment: Performed at [facility] [address], [place], [place] [address]  Valproic acid level     Status: Abnormal   Collection Time: 07/01/20 10:25 AM  Result Value Ref Range   Valproic Acid Lvl 30 (L) 50.0 - 100.0 ug/mL    Comment: Performed at [facility] [address] [place], [place] [address]  Magnesium     Status: None   Collection Time: 07/01/20 10:25 AM  Result Value Ref Range    Magnesium 2.3 1.7 - 2.4 mg/dL    Comment: Performed  at [facility], [place] [address], [place], [place] [address]  I-Stat arterial blood gas, [facility] ED only)     Status: Abnormal   Collection Time: 07/01/20 11:19 AM  Result Value Ref Range   pH, Arterial 7.248 (L) 7.35 - 7.45   pCO2 arterial 46.3 32 - 48 mmHg   pO2, Arterial 192 (H) 83 - 108 mmHg   Bicarbonate 20.2 20.0 - 28.0 mmol/L   TCO2 22 22 - 32 mmol/L   O2 Saturation 99.0 %   Acid-base deficit 7.0 (H) 0.0 - 2.0 mmol/L   Sodium 138 135 - 145 mmol/L   Potassium 4.9 3.5 - 5.1 mmol/L   Calcium, Ion 1.19 1.15 - 1.40 mmol/L   HCT 48.0 39 - 52 %   Hemoglobin 16.3 13.0 - 17.0 g/dL   Sample type ARTERIAL   Respiratory Panel by RT PCR (Flu A&B, Covid) - Nasopharyngeal Swab     Status: None   Collection Time: 07/01/20 12:00 PM   Specimen: Nasopharyngeal Swab  Result Value Ref Range   SARS Coronavirus 2 by RT PCR NEGATIVE NEGATIVE    Comment: (NOTE) SARS-CoV-2 target nucleic acids are NOT DETECTED.  The SARS-CoV-2 RNA is generally detectable in upper respiratoy specimens during the acute phase of infection. The lowest concentration of SARS-CoV-2 viral copies this assay can detect is 131 copies/mL. A negative result does not preclude SARS-Cov-2 infection and should not be used as the sole basis for treatment or other patient management decisions. A negative result may occur with  improper specimen collection/handling, submission of specimen other than nasopharyngeal swab, presence of viral mutation(s) within the areas targeted by this assay, and inadequate number of viral copies (<131 copies/mL). A negative result must be combined with clinical observations, patient history, and epidemiological information. The expected result is Negative.  Fact Sheet for Patients:  PinkCheek.be  Fact Sheet for Healthcare Providers:  GravelBags.it  This test is no t yet approved or  cleared by the [place] FDA and  has been authorized for detection and/or diagnosis of SARS-CoV-2 by FDA under an Emergency Use Authorization (EUA). This EUA will remain  in effect (meaning this test can be used) for the duration of the COVID-19 declaration under Section 564(b)(1) of the Act, 21 U.S.C. section 360bbb-3(b)(1), unless the authorization is terminated or revoked sooner.     Influenza A by PCR NEGATIVE NEGATIVE   Influenza B by PCR NEGATIVE NEGATIVE    Comment: (NOTE) The Xpert Xpress SARS-CoV-2/FLU/RSV assay is intended as an aid in  the diagnosis of influenza from Nasopharyngeal swab specimens and  should not be used as a sole basis for treatment. Nasal washings and  aspirates are unacceptable for Xpert Xpress SARS-CoV-2/FLU/RSV  testing.  Fact Sheet for Patients: PinkCheek.be  Fact Sheet for Healthcare Providers: GravelBags.it  This test is not yet approved or cleared by the [place] FDA and  has been authorized for detection and/or diagnosis of SARS-CoV-2 by  FDA under an Emergency Use Authorization (EUA). This EUA will remain  in effect (meaning this test can be used) for the duration of the  Covid-19 declaration under Section 564(b)(1) of the Act, 21  U.S.C. section 360bbb-3(b)(1), unless the authorization is  terminated or revoked. Performed at [facility], [place] [address] [place], [place] [address]   I-Stat arterial blood gas, ED     Status: Abnormal   Collection Time: 07/01/20  6:17 PM  Result Value Ref Range   pH, Arterial 7.544 (H)  7.35 - 7.45   pCO2 arterial 29.4 (L) 32 - 48 mmHg   pO2, Arterial 156 (H) 83 - 108 mmHg   Bicarbonate 25.4 20.0 - 28.0 mmol/L   TCO2 26 22 - 32 mmol/L   O2 Saturation 100.0 %   Acid-Base Excess 4.0 (H) 0.0 - 2.0 mmol/L   Sodium 140 135 - 145 mmol/L   Potassium 3.8 3.5 - 5.1 mmol/L   Calcium, Ion 1.19 1.15 - 1.40 mmol/L   HCT 45.0 39 - 52 %    Hemoglobin 15.3 13.0 - 17.0 g/dL   Sample type ARTERIAL   MRSA PCR Screening     Status: Abnormal   Collection Time: 07/01/20 10:10 PM   Specimen: Nasopharyngeal  Result Value Ref Range   MRSA by PCR POSITIVE (A) NEGATIVE    Comment: CRITICAL RESULT CALLED TO, READ BACK BY AND VERIFIED WITH: RN, Clyde Lundborg [phone]'  THANEY Performed at [facility], [address] [place], [place] [address]   I-STAT 7, (LYTES, BLD GAS, ICA, H+H)     Status: Abnormal   Collection Time: 07/02/20  2:33 AM  Result Value Ref Range   pH, Arterial 7.470 (H) 7.35 - 7.45   pCO2 arterial 35.2 32 - 48 mmHg   pO2, Arterial 140 (H) 83 - 108 mmHg   Bicarbonate 25.6 20.0 - 28.0 mmol/L   TCO2 27 22 - 32 mmol/L   O2 Saturation 99.0 %   Acid-Base Excess 2.0 0.0 - 2.0 mmol/L   Sodium 141 135 - 145 mmol/L   Potassium 3.9 3.5 - 5.1 mmol/L   Calcium, Ion 1.18 1.15 - 1.40 mmol/L   HCT 45.0 39 - 52 %   Hemoglobin 15.3 13.0 - 17.0 g/dL   Patient temperature 98.6 F    Collection site Radial    Drawn by Operator    Sample type ARTERIAL   CBC with Differential/Platelet     Status: Abnormal   Collection Time: 07/02/20  3:16 AM  Result Value Ref Range   WBC 10.5 4.0 - 10.5 K/uL   RBC 5.00 4.22 - 5.81 MIL/uL   Hemoglobin 14.7 13.0 - 17.0 g/dL   HCT 44.1 39 - 52 %   MCV 88.2 80.0 - 100.0 fL    Comment: REPEATED TO VERIFY   MCH 29.4 26.0 - 34.0 pg   MCHC 33.3 30.0 - 36.0 g/dL   RDW 13.7 11.5 - 15.5 %   Platelets 196 150 - 400 K/uL   nRBC 0.0 0.0 - 0.2 %   Neutrophils Relative % 69 %   Neutro Abs 7.2 1.7 - 7.7 K/uL   Lymphocytes Relative 20 %   Lymphs Abs 2.1 0.7 - 4.0 K/uL   Monocytes Relative 11 %   Monocytes Absolute 1.2 (H) 0.1 - 1.0 K/uL   Eosinophils Relative 0 %   Eosinophils Absolute 0.0 0 - 0 K/uL   Basophils Relative 0 %   Basophils Absolute 0.0 0 - 0 K/uL   Immature Granulocytes 0 %   Abs Immature Granulocytes 0.03 0.00 - 0.07 K/uL    Comment: Performed at [facility], [place]  [address] [place], [place] [address]  Basic metabolic panel     Status: Abnormal   Collection Time: 07/02/20  3:16 AM  Result Value Ref Range   Sodium 140 135 - 145 mmol/L   Potassium 4.0 3.5 - 5.1 mmol/L   Chloride 106 98 - 111 mmol/L   CO2 22 22 - 32 mmol/L  Glucose, Bld 104 (H) 70 - 99 mg/dL    Comment: Glucose reference range applies only to samples taken after fasting for at least 8 hours.   BUN 13 6 - 20 mg/dL   Creatinine, Ser 1.21 0.61 - 1.24 mg/dL   Calcium 8.9 8.9 - 10.3 mg/dL   GFR, Estimated >60 >60 mL/min   Anion gap 12 5 - 15    Comment: Performed at [facility] [address], [place], [place] [address]  Magnesium     Status: None   Collection Time: 07/02/20  3:16 AM  Result Value Ref Range   Magnesium 2.3 1.7 - 2.4 mg/dL    Comment: Performed at [facility] [address] [place], [place] [address]  Valproic acid level     Status: Abnormal   Collection Time: 07/02/20  3:16 AM  Result Value Ref Range   Valproic Acid Lvl 122 (H) 50.0 - 100.0 ug/mL    Comment: Performed at [facility] [address], [place], [place] [address]  CK     Status: Abnormal   Collection Time: 07/02/20  5:38 AM  Result Value Ref Range   Total CK 886 (H) 49.0 - 397.0 U/L    Comment: Performed at [facility], [place] [address] [place], [place] [address]    Recent Results (from the past 240 hour(s))  Respiratory Panel by RT PCR (Flu A&B, Covid) - Nasopharyngeal Swab     Status: None   Collection Time: 07/01/20 12:00 PM   Specimen: Nasopharyngeal Swab  Result Value Ref Range Status   SARS Coronavirus 2 by RT PCR NEGATIVE NEGATIVE Final    Comment: (NOTE) SARS-CoV-2 target nucleic acids are NOT DETECTED.  The SARS-CoV-2 RNA is generally detectable in upper respiratoy specimens during the acute phase of infection. The lowest concentration of SARS-CoV-2 viral copies this assay can detect is 131 copies/mL. A negative result does not preclude SARS-Cov-2 infection and  should not be used as the sole basis for treatment or other patient management decisions. A negative result may occur with  improper specimen collection/handling, submission of specimen other than nasopharyngeal swab, presence of viral mutation(s) within the areas targeted by this assay, and inadequate number of viral copies (<131 copies/mL). A negative result must be combined with clinical observations, patient history, and epidemiological information. The expected result is Negative.  Fact Sheet for Patients:  PinkCheek.be  Fact Sheet for Healthcare Providers:  GravelBags.it  This test is no t yet approved or cleared by the [place] FDA and  has been authorized for detection and/or diagnosis of SARS-CoV-2 by FDA under an Emergency Use Authorization (EUA). This EUA will remain  in effect (meaning this test can be used) for the duration of the COVID-19 declaration under Section 564(b)(1) of the Act, 21 U.S.C. section 360bbb-3(b)(1), unless the authorization is terminated or revoked sooner.     Influenza A by PCR NEGATIVE NEGATIVE Final   Influenza B by PCR NEGATIVE NEGATIVE Final    Comment: (NOTE) The Xpert Xpress SARS-CoV-2/FLU/RSV assay is intended as an aid in  the diagnosis of influenza from Nasopharyngeal swab specimens and  should not be used as a sole basis for treatment. Nasal washings and  aspirates are unacceptable for Xpert Xpress SARS-CoV-2/FLU/RSV  testing.  Fact Sheet for Patients: PinkCheek.be  Fact Sheet for Healthcare Providers: GravelBags.it  This test is not yet approved or cleared by the [place] FDA and  has been authorized for detection  and/or diagnosis of SARS-CoV-2 by  FDA under an Emergency Use Authorization (EUA). This EUA will remain  in effect (meaning this test can be used) for the duration of the  Covid-19 declaration  under Section 564(b)(1) of the Act, 21  U.S.C. section 360bbb-3(b)(1), unless the authorization is  terminated or revoked. Performed at [facility], [place] [address] [place], [place] [address]   MRSA PCR Screening     Status: Abnormal   Collection Time: 07/01/20 10:10 PM   Specimen: Nasopharyngeal  Result Value Ref Range Status   MRSA by PCR POSITIVE (A) NEGATIVE Final    Comment: CRITICAL RESULT CALLED TO, READ BACK BY AND VERIFIED WITH: RN, Clyde Lundborg [phone]'  THANEY Performed at [facility], [place] [address] [place], [place] [address]     Lipid Panel No results for input(s): CHOL, TRIG, HDL, CHOLHDL, VLDL, LDLCALC in the last 72 hours.  Studies/Results: CT Head Wo Contrast  Result Date: 07/01/2020 CLINICAL DATA:  Seizure. EXAM: CT HEAD WITHOUT CONTRAST TECHNIQUE: Contiguous axial images were obtained from the base of the skull through the vertex without intravenous contrast. COMPARISON:  December 24, 2019 FINDINGS: Brain: No evidence of acute infarction, hemorrhage, hydrocephalus, extra-axial collection or mass lesion/mass effect. Vascular: No hyperdense vessel or unexpected calcification. Skull: Normal. Negative for fracture or focal lesion. Sinuses/Orbits: No acute finding. Other: None. IMPRESSION: No focal acute intracranial abnormality identified. Electronically Signed   By: Abelardo Diesel M.D.   On: 07/01/2020 13:37   DG Chest Port 1 View  Result Date: 07/01/2020 CLINICAL DATA:  Seizure. EXAM: PORTABLE CHEST 1 VIEW COMPARISON:  Chest radiograph 12/27/2019. FINDINGS: ETT tip projects at the carina, recommend retraction. Enteric tube tip and side-port project over the stomach. Monitoring leads overlie the patient. Stable cardiac and mediastinal contours. Low lung volumes. Bibasilar heterogeneous opacities. No pleural effusion or pneumothorax. Thoracic spine degenerative changes. IMPRESSION: ET tube terminates at the carina, recommend retraction. Low lung volumes with  bibasilar opacities favored represent atelectasis. These results will be called to the ordering clinician or representative by the Radiologist Assistant, and communication documented in the PACS or [employer]. Electronically Signed   By: Lovey Newcomer M.D.   On: 07/01/2020 12:02   DG Abd Portable 1 View  Result Date: 07/01/2020 CLINICAL DATA:  Verify OG tube placement. EXAM: PORTABLE ABDOMEN - 1 VIEW COMPARISON:  Chest radiograph-earlier same day FINDINGS: When correlated with preceding chest radiograph, enteric tube tip and side port project over the expected location of the gastric fundus. Paucity of bowel gas without evidence of enteric obstruction Nondiagnostic evaluation for pneumoperitoneum secondary to supine positioning and exclusion of the lower thorax. No pneumatosis or portal venous gas. No acute osseous abnormalities IMPRESSION: Enteric tube tip and side port project over the expected location the gastric fundus Electronically Signed   By: Sandi Mariscal M.D.   On: 07/01/2020 12:27   EEG adult  Result Date: 07/01/2020 Lora Havens, MD     07/02/2020  8:07 AM Patient Name: Dylan Moon MRN: [id number] Epilepsy Attending: Lora Havens Referring Physician/Provider: Dr Janene Madeira Date: 07/01/2020 Duration: 27.14 mins Patient history: 24yo M with h/o epilepsy who presented with breakthrough seizure. EEG to evaluate for seizure Level of alertness:  Comatose AEDs during EEG study: VPA, versed, propofol Technical aspects: This EEG study was done with scalp electrodes positioned according to the 10-20 International system of electrode placement. Electrical activity was acquired at a sampling rate of '500Hz'  and reviewed with  a high frequency filter of '70Hz'  and a low frequency filter of '1Hz' . EEG data were recorded continuously and digitally stored. Description:  EEG showed continuous generalized 2-'3Hz'  delta slowing with overriding 15 to 18 Hz beta activity distributed symmetrically and  diffusely. Hyperventilation and photic stimulation were not performed.   ABNORMALITY -Continuous slow, generalized IMPRESSION: This study is suggestive of severe diffuse encephalopathy, nonspecific etiology but likely related to sedation. No seizures or epileptiform discharges were seen throughout the recording. Priyanka Barbra Sarks   Overnight EEG with video  Result Date: 07/02/2020 Lora Havens, MD     07/02/2020  8:13 AM Patient Name: LORENCE NAGENGAST MRN: [id number] Epilepsy Attending: Lora Havens Referring Physician/Provider: Dr Janene Madeira Duration: 07/01/2020 1408 to 07/02/2020 0815  Patient history: 23yo M with h/o epilepsy who presented with breakthrough seizure. EEG to evaluate for seizure  Level of alertness:  Comatose  AEDs during EEG study: VPA, Aptiom, versed, propofol  Technical aspects: This EEG study was done with scalp electrodes positioned according to the 10-20 International system of electrode placement. Electrical activity was acquired at a sampling rate of '500Hz'  and reviewed with a high frequency filter of '70Hz'  and a low frequency filter of '1Hz' . EEG data were recorded continuously and digitally stored.  Description:  EEG showed continuous generalized 2-'3Hz'  delta slowing with overriding 15 to 18 Hz beta activity distributed symmetrically and diffusely. Hyperventilation and photic stimulation were not performed.    ABNORMALITY -Continuous slow, generalized   IMPRESSION: This study is suggestive of severe diffuse encephalopathy, nonspecific etiology but likely related to sedation. No seizures or epileptiform discharges were seen throughout the recording.   Priyanka Barbra Sarks    Medications:  Scheduled: . chlorhexidine gluconate (MEDLINE KIT)  15 mL Mouth Rinse BID  . Chlorhexidine Gluconate Cloth  6 each Topical Daily  . enoxaparin (LOVENOX) injection  40 mg Subcutaneous Q24H  . Eslicarbazepine Acetate  1,600 mg Per Tube Daily  . mouth rinse  15 mL Mouth Rinse 10 times  per day  . mupirocin ointment  1 application Nasal BID  . valproic acid  1,000 mg Per Tube TID   Continuous: . famotidine (PEPCID) IV 20 mg (07/02/20 1013)  . midazolam 15 mg/hr (07/02/20 1015)  . propofol (DIPRIVAN) infusion 50 mcg/kg/min (07/02/20 0720)    LTM EEG 07/01/2020 1408 to 07/02/2020 0815 Description: EEG showed continuous generalized 2-'3Hz'  deltaslowing with overriding15 to 18 Hz beta activity distributed symmetrically and diffusely. Hyperventilation and photic stimulation were not performed.  ABNORMALITY: Continuousslow, generalized. IMPRESSION: This study is suggestive of severe diffuse encephalopathy, nonspecific etiology but likely related to sedation.No seizures or epileptiform discharges were seen throughout the recording.  Neurology NP participating in the care of our patient today: Desiree Metzger-Cihelka, ARNP-C, ANVP-BC Pager: [phone]  Assessment: 24 year old male with breakthrough seizures progressing to status epilepticus. Now intubated and on propofol gtt. Received multiple doses of Versed/Ativan without cessation of seizures prior to intubation and propofol sedation, which was successful at aborting clinical seizure activity.  1. Per chart review and history obtained by EDP, the patient's breakthrough seizures most likely are secondary to medication noncompliance. However, mother disagreed and stated that he is compliant. Valproic acid level was 30 yesterday, consistent with either partial compliance or fast metabolism of home dosage regimen. Repeat level early this AM was 122 due to the loading dose in the ED.  2. CT head: No focal acute intracranial abnormality identified. 3. LTM EEG report for today: Findings suggestive of severe  diffuse encephalopathy, nonspecific etiology but likely related to sedation.No seizures or epileptiform discharges were seen throughout the recording.  4. On sedation with Versed at 15 mg/h and propofol at 50  mcg/kg/min  Recommendations: 1. Continue LTM while we wean off sedation to watch closely for further seizures during the transition off sedation  2. Continue valproic acid scheduled at 1000 mg per tube TID  3. Continue Aptiom (non-formulary, using pills brought from home) at 1200 mg po qd. 4. Gradually taper off propofol over 4-8 hours. 5. Once off Propofol, gradually taper off Versed over 4-8 hours.  Call Neurology to reassess after propofol has been discontinued.  6. Inpatient seizure precautions.  7. When awake and fully alert, patient will need to review outpatient seizure precautions as follows: Per [facility] statutes, patients with seizures are not allowed to drive until  they have been seizure-free for six months. Use caution when using heavy equipment or power tools. Avoid working on ladders or at heights. Take showers instead of baths. Ensure the water temperature is not too high on the home water heater. Do not go swimming alone. When caring for infants or small children, sit down when holding, feeding, or changing them to minimize risk of injury to the child in the event you have a seizure. Also, Maintain good sleep hygiene. Avoid alcohol.   LOS: 1 day   35 minutes spent in the neurological evaluation and management of this critically ill patient.   Addendum: Patient becoming agitated off propofol with Versed at a rate of 15. Will restart propofol and titrate as needed to a max rate of 50. CCM has started fentanyl at a rate of 100, as patient has a history of agitation during prior admission with intubation. Plan would then be to hold Versed and allow it to auto-taper, reassess EEG, then stop propofol, reassess EEG again and then if no seizure recurrence, aim for tapering off fentanyl followed by extubation.   Electronically signed: Dr. Kerney Elbe

## 2020-07-02 NOTE — Progress Notes (Signed)
Resume propofol at 50mg /hr, taper off versed and add fentanyl gtt per Dr.Lindzen's orders.

## 2020-07-02 NOTE — Progress Notes (Addendum)
Upon attempts to wean benzo, patient with whole body jerking and dyssynchronous with vent. I would recommend continued benzo for now at 5mg /hr, give additional bolusof 10 now and increase propofol to 60/h Attempt wean with closer EEG monitoring in the AM.  -- , MD [facility] Pager: [phone] If 7pm to 7am, please call on call as listed on AMION.

## 2020-07-02 NOTE — Procedures (Addendum)
Patient Name: Dylan Moon  MRN: [id number]  Epilepsy Attending: Charlsie Quest  Referring Physician/Provider: Dr Nilda Simmer Duration: 07/02/2020 1408 to 07/03/2020 1408  Patient history: 24yo M with h/o epilepsy who presented with breakthrough seizure. EEG to evaluate for seizure  Level of alertness:  Comatose  AEDs during EEG study: VPA, Aptiom, versed, propofol  Technical aspects: This EEG study was done with scalp electrodes positioned according to the 10-20 International system of electrode placement. Electrical activity was acquired at a sampling rate of 500Hz  and reviewed with a high frequency filter of 70Hz  and a low frequency filter of 1Hz . EEG data were recorded continuously and digitally stored.   Description:  EEG showed continuous generalized 2-3Hz  delta slowing with overriding 15 to 18 Hz beta activity distributed symmetrically and diffusely.  Hyperventilation and photic stimulation were not performed.     Event button was pressed on 07/02/2020 at 2054, 2116 and 2128. On video, patient was noted to have brief twitching. Concomitant eeg before, during and after the event didn't show any eeg change.     ABNORMALITY -Continuous slow, generalized  IMPRESSION: This study is suggestive of severe diffuse encephalopathy, nonspecific etiology but likely related to sedation. No seizures or epileptiform discharges were seen throughout the recording.  Event button was pressed as described above without concomitant eeg change and was not epileptic.    Ameshia Pewitt [phone]

## 2020-07-03 ENCOUNTER — Encounter (HOSPITAL_COMMUNITY): Payer: Self-pay | Admitting: Pulmonary Disease

## 2020-07-03 DIAGNOSIS — G40901 Epilepsy, unspecified, not intractable, with status epilepticus: Secondary | ICD-10-CM | POA: Diagnosis not present

## 2020-07-03 DIAGNOSIS — R569 Unspecified convulsions: Secondary | ICD-10-CM | POA: Diagnosis not present

## 2020-07-03 LAB — CBC WITH DIFFERENTIAL/PLATELET
Abs Immature Granulocytes: 0.03 10*3/uL (ref 0.00–0.07)
Basophils Absolute: 0 10*3/uL (ref 0.0–0.1)
Basophils Relative: 0 %
Eosinophils Absolute: 0 10*3/uL (ref 0.0–0.5)
Eosinophils Relative: 0 %
HCT: 42.2 % (ref 39.0–52.0)
Hemoglobin: 13.7 g/dL (ref 13.0–17.0)
Immature Granulocytes: 0 %
Lymphocytes Relative: 21 %
Lymphs Abs: 2.3 10*3/uL (ref 0.7–4.0)
MCH: 28.7 pg (ref 26.0–34.0)
MCHC: 32.5 g/dL (ref 30.0–36.0)
MCV: 88.5 fL (ref 80.0–100.0)
Monocytes Absolute: 1.1 10*3/uL — ABNORMAL HIGH (ref 0.1–1.0)
Monocytes Relative: 11 %
Neutro Abs: 7.3 10*3/uL (ref 1.7–7.7)
Neutrophils Relative %: 68 %
Platelets: 163 10*3/uL (ref 150–400)
RBC: 4.77 MIL/uL (ref 4.22–5.81)
RDW: 13.6 % (ref 11.5–15.5)
WBC: 10.8 10*3/uL — ABNORMAL HIGH (ref 4.0–10.5)
nRBC: 0 % (ref 0.0–0.2)

## 2020-07-03 LAB — POCT I-STAT 7, (LYTES, BLD GAS, ICA,H+H)
Acid-Base Excess: 3 mmol/L — ABNORMAL HIGH (ref 0.0–2.0)
Bicarbonate: 25.5 mmol/L (ref 20.0–28.0)
Calcium, Ion: 1.17 mmol/L (ref 1.15–1.40)
HCT: 42 % (ref 39.0–52.0)
Hemoglobin: 14.3 g/dL (ref 13.0–17.0)
O2 Saturation: 96 %
Patient temperature: 99.1
Potassium: 3.8 mmol/L (ref 3.5–5.1)
Sodium: 141 mmol/L (ref 135–145)
TCO2: 26 mmol/L (ref 22–32)
pCO2 arterial: 33.2 mmHg (ref 32.0–48.0)
pH, Arterial: 7.493 — ABNORMAL HIGH (ref 7.350–7.450)
pO2, Arterial: 77 mmHg — ABNORMAL LOW (ref 83.0–108.0)

## 2020-07-03 LAB — GLUCOSE, CAPILLARY
Glucose-Capillary: 109 mg/dL — ABNORMAL HIGH (ref 70–99)
Glucose-Capillary: 126 mg/dL — ABNORMAL HIGH (ref 70–99)
Glucose-Capillary: 129 mg/dL — ABNORMAL HIGH (ref 70–99)
Glucose-Capillary: 81 mg/dL (ref 70–99)
Glucose-Capillary: 88 mg/dL (ref 70–99)
Glucose-Capillary: 93 mg/dL (ref 70–99)
Glucose-Capillary: 96 mg/dL (ref 70–99)

## 2020-07-03 LAB — COMPREHENSIVE METABOLIC PANEL
ALT: 13 U/L (ref 0–44)
AST: 23 U/L (ref 15–41)
Albumin: 3.1 g/dL — ABNORMAL LOW (ref 3.5–5.0)
Alkaline Phosphatase: 29 U/L — ABNORMAL LOW (ref 38–126)
Anion gap: 11 (ref 5–15)
BUN: 14 mg/dL (ref 6–20)
CO2: 24 mmol/L (ref 22–32)
Calcium: 8.7 mg/dL — ABNORMAL LOW (ref 8.9–10.3)
Chloride: 106 mmol/L (ref 98–111)
Creatinine, Ser: 1.14 mg/dL (ref 0.61–1.24)
GFR, Estimated: >60 mL/min (ref >60)
Glucose, Bld: 89 mg/dL (ref 70–99)
Potassium: 4 mmol/L (ref 3.5–5.1)
Sodium: 141 mmol/L (ref 135–145)
Total Bilirubin: 0.3 mg/dL (ref 0.3–1.2)
Total Protein: 6 g/dL — ABNORMAL LOW (ref 6.5–8.1)

## 2020-07-03 LAB — PHOSPHORUS
Phosphorus: 3.7 mg/dL (ref 2.5–4.6)
Phosphorus: 3.5 mg/dL (ref 2.5–4.6)

## 2020-07-03 LAB — MAGNESIUM: Magnesium: 2.1 mg/dL (ref 1.7–2.4)

## 2020-07-03 MED ORDER — VITAL HIGH PROTEIN PO LIQD: 1000.0000 mL | ORAL | Status: DC

## 2020-07-03 MED ORDER — VITAL 1.5 CAL PO LIQD
1000.0000 mL | ORAL | Status: DC
  Administered 2020-07-03 – 2020-07-05 (×3): 1000 mL
  Filled 2020-07-03 (×3): qty 1000

## 2020-07-03 MED ORDER — PROSOURCE TF PO LIQD
45.0000 mL | Freq: Every day | ORAL | Status: DC
  Administered 2020-07-03 – 2020-07-06 (×16): 45 mL
  Filled 2020-07-03 (×14): qty 45

## 2020-07-03 NOTE — Progress Notes (Signed)
eLink Physician-Brief Progress Note Patient Name: Dylan Moon DOB: 1996/03/05 MRN: [id number]   Date of Service  07/03/2020  HPI/Events of Note  Agitation - Nursing request to renew restraint orders.   eICU Interventions  Will renew soft bilateral wrist and waist belt restraints X 11 hours.      Intervention Category Major Interventions: Delirium, psychosis, severe agitation - evaluation and management  [name] 07/03/2020, 9:14 PM

## 2020-07-03 NOTE — Progress Notes (Signed)
NAME:  Dylan Moon, MRN:  [id number], DOB:  01-20-1996, LOS: 2 ADMISSION DATE:  07/01/2020, CONSULTATION DATE:  07/01/2020 REFERRING MD:  ED, CHIEF COMPLAINT:  Persistent seizures   Brief History   This is a 24 year old with a history of seizure disorder and prior history of status who will began seizing this morning.  Apparently had 30 minutes of uninterrupted seizures before EMS arrived.  He was given benzodiazepines in transport but seizures did not resolve.  He was given further benzodiazepines on arrival in the department of emergency medicine however he subsequently became apneic and required intubation.  Despite being loaded with valproic acid and started on propofol he is still intermittently having seizures being treated with boluses of Versed. History of present illness   Have no one present to provide [place] with any history.  Appears that he has had episodes of status in the past related to noncompliance I know nothing of whether he had any concurrent illness fevers chills changes in medications or use of street drugs  Past Medical History  Epilepsy, prior status, anxiety, and ADHD.  Significant Hospital Events   Intubated in ER 10/9  Consults:  Neurology  Procedures:  None  Significant Diagnostic Tests:  CT Head 10/9 >> Negative for intracranial abnormalities   cEEG 10/9 - 10/10 >> Negative for seizures or epileptiform discharges. Severe diffuse encephalopathy.  Micro Data:  None   Antimicrobials:  None   Interim history/subjective:   Attempted to wean from sedation overnight. Patient with whole body jerking and dyssynchronous from the vent, put back on benzo and increased propofol by Neuro.  Objective   Blood pressure 107/65, pulse 63, temperature 98.5 F (36.9 C), temperature source Axillary, resp. rate 15, height 6' (1.829 m), weight 90.9 kg, SpO2 100 %.    Vent Mode: PRVC FiO2 (%):  [40 %] 40 % Set Rate:  [15 bmp] 15 bmp Vt Set:  [620 mL] 620 mL PEEP:  [5  cmH20] 5 cmH20 Plateau Pressure:  [19 cmH20-24 cmH20] 24 cmH20   Intake/Output Summary (Last 24 hours) at 07/03/2020 0933 Last data filed at 07/03/2020 0600 Gross per 24 hour  Intake 1127.61 ml  Output 250 ml  Net 877.61 ml   Filed Weights   07/01/20 1019 07/01/20 2200  Weight: 99.8 kg 90.9 kg   Physical Exam GEN: Sedated and intubated young male in NAD HEENT: [place]/AT with EEG leads, ETT in place, Pinpoint pupils equal and mildly reactive to light CV: RRR S1,S2. No m/r/g PULM: Mechanical breath sounds, synchronous with vent, sound of possible cuff airleak ABD: Normoactive bowel sounds, nondistended, soft NEURO: EOM not appreciable, sedated, unresponive to voice MSK: Sedated not moving extremities, no edema noted DERM: Warm and dry, no rashes or abrasions  Resolved Hospital Problem list     Assessment & Plan:   # Breakthrough Seizures complicated by Status Epilepticus # Hx of Seizures  - Hx of frequent breakthrough seizures secondary to medication non-compliance - UDS also positive for [facility] - Neurology is following; appreciate their recommendations  - On admission, Valproic Acid was continued 1000 mg TID, with the addition of home medication Aptiom, brought by patient's mother - Started on Propofol gtt with continuation of Versed as well - cEEG overnight has been negative for any additional seizures or epileptiform discharges.  - Will follow up Neurology's recommendations on weaning of sedation.  # Acute Hypoxic Respiratory Failure secondary to Acute Encephalopathy - Intubated for airway protection - Full vent support  - VAP measures  in place - Consider SBT when sedation has been weaned and patient able to follow commands.   Best practice:  Diet: N.p.o.  Pain/Anxiety/Delirium protocol (if indicated): Not indicated VAP protocol (if indicated): Ordered DVT prophylaxis: Lovenox GI prophylaxis: Pepcid Glucose control: Not indicated Mobility: Bedrest Code Status: Full  code Family Communication: Will update mother  Disposition: ICU  Labs   CBC: Recent Labs  Lab 07/01/20 1025 07/01/20 1119 07/01/20 1817 07/02/20 0233 07/02/20 0316 07/03/20 0319 07/03/20 0436  WBC 15.8*  --   --   --  10.5  --  10.8*  NEUTROABS 3.4  --   --   --  7.2  --  7.3  HGB 16.7   < > 15.3 15.3 14.7 14.3 13.7  HCT 57.8*   < > 45.0 45.0 44.1 42.0 42.2  MCV 99.5  --   --   --  88.2  --  88.5  PLT 259  --   --   --  196  --  163   < > = values in this interval not displayed.    Basic Metabolic Panel: Recent Labs  Lab 07/01/20 1025 07/01/20 1119 07/01/20 1817 07/02/20 0233 07/02/20 0316 07/03/20 0319 07/03/20 0436  NA 144   < > 140 141 140 141 141  K 4.5   < > 3.8 3.9 4.0 3.8 4.0  CL 103  --   --   --  106  --  106  CO2 8*  --   --   --  22  --  24  GLUCOSE 218*  --   --   --  104*  --  89  BUN 18  --   --   --  13  --  14  CREATININE 1.50*  --   --   --  1.21  --  1.14  CALCIUM 10.0  --   --   --  8.9  --  8.7*  MG 2.3  --   --   --  2.3  --  2.1   < > = values in this interval not displayed.   GFR: Estimated Creatinine Clearance: 108.7 mL/min (by C-G formula based on SCr of 1.14 mg/dL). Recent Labs  Lab 07/01/20 1025 07/02/20 0316 07/03/20 0436  WBC 15.8* 10.5 10.8*    Liver Function Tests: Recent Labs  Lab 07/01/20 1025 07/03/20 0436  AST 40 23  ALT 19 13  ALKPHOS 43 29*  BILITOT 1.1 0.3  PROT 7.9 6.0*  ALBUMIN 4.9 3.1*   No results for input(s): LIPASE, AMYLASE in the last 168 hours. No results for input(s): AMMONIA in the last 168 hours.  ABG    Component Value Date/Time   PHART 7.493 (H) 07/03/2020 0319   PCO2ART 33.2 07/03/2020 0319   PO2ART 77 (L) 07/03/2020 0319   HCO3 25.5 07/03/2020 0319   TCO2 26 07/03/2020 0319   ACIDBASEDEF 7.0 (H) 07/01/2020 1119   O2SAT 96.0 07/03/2020 0319     Coagulation Profile: Recent Labs  Lab 07/01/20 1025  INR 1.2    Cardiac Enzymes: Recent Labs  Lab 07/02/20 0538  CKTOTAL 886*     HbA1C: Hgb A1c MFr Bld  Date/Time Value Ref Range Status  12/24/2019 05:13 PM 5.8 (H) 4.8 - 5.6 % Final    Comment:    (NOTE) Pre diabetes:          5.7%-6.4% Diabetes:              >6.4%  Glycemic control for   <7.0% adults with diabetes     CBG: Recent Labs  Lab 07/01/20 1002 07/02/20 2002 07/02/20 2358 07/03/20 0357 07/03/20 0821  GLUCAP 213* 95 109* 96 93    Review of Systems:    Past Medical History  He,  has a past medical history of ADHD, Scoliosis, and Seizures (HCC).   Surgical History    Past Surgical History:  Procedure Laterality Date   NO PAST SURGERIES       Social History   reports that he has never smoked. He has never used smokeless tobacco. He reports current drug use. Drug: Marijuana. He reports that he does not drink alcohol.   Family History   His family history includes Cancer in an other family member; Diabetes in his father and mother; Hypertension in his mother; Seizures in his maternal grandfather.   Allergies Allergies  Allergen Reactions   Keppra [Levetiracetam] Other (See Comments)    Irritability    Tramadol Other (See Comments)    Contraindicated with current medications (??)   Vimpat [Lacosamide] Other (See Comments)    Causes anger     Home Medications  Prior to Admission medications   Medication Sig Start Date End Date Taking? Authorizing Provider  APTIOM 800 MG TABS Take 1,200 mg by mouth daily. Taking 1.5 tablets = 1200mg  01/01/20   03/02/20, DO  busPIRone (BUSPAR) 7.5 MG tablet Take 7.5 mg by mouth 2 (two) times daily. 01/19/20 01/18/21  [provider]  divalproex (DEPAKOTE ER) 500 MG 24 hr tablet Take 3,000 mg by mouth daily. 01/04/20   [provider]  escitalopram (LEXAPRO) 5 MG tablet Take 5 mg by mouth daily. 03/08/20   [provider]  Vitamin D, Ergocalciferol, (DRISDOL) 1.25 MG (50000 UNIT) CAPS capsule Take 50,000 Units by mouth once a week. 01/24/20   [provider]    03/25/20, MS4

## 2020-07-03 NOTE — Procedures (Signed)
Cortrak  Person Inserting Tube:  Dylan Moon, Dylan Moon, RD Tube Type:  Cortrak - 43 inches Tube Location:  Right nare Initial Placement:  Stomach Secured by: Bridle Technique Used to Measure Tube Placement:  Documented cm marking at nare/ corner of mouth Cortrak Secured At:  68 cm    Cortrak Tube Team Note:  Consult received to place a Cortrak feeding tube.   No x-ray is required. RN may begin using tube.    If the tube becomes dislodged please keep the tube and contact the Cortrak team at [email or web address] (password [email or web address]) for replacement.  If after hours and replacement cannot be delayed, place a NG tube and confirm placement with an abdominal x-ray.    Dylan Gavia, MS, RD, LDN RD pager number and weekend/on-call pager number located in [place].

## 2020-07-03 NOTE — Progress Notes (Signed)
Initial Nutrition Assessment  RD working remotely.  DOCUMENTATION CODES:   Not applicable  INTERVENTION:  - once Cortrak placed: Vital 1.5 @ 55 ml/hr with 45 ml Prosource TF x5/day. - this regimen will provide 2180 kcal, 144 grams protein, and 1008 ml free water.  - free water flush, if desired, to be per CCM.   NUTRITION DIAGNOSIS:   Inadequate oral intake related to inability to eat as evidenced by NPO status.  GOAL:   Patient will meet greater than or equal to 90% of their needs  MONITOR:   Vent status, Labs, Weight trends  REASON FOR ASSESSMENT:   Ventilator, Consult Enteral/tube feeding initiation and management  ASSESSMENT:   24 year old male with medical history of seizure disorder, ADHD, and scoliosis. He began seizing on the morning of 10/9 with reported 30 minutes of uninterrupted seizures before EMS arrival. He became apneic in the ED and required intubation. He continues to have intermittent seizures that are being treated with boluses of versed.  Patient is intubated. No OGT/NGT documented in the flow sheet. Able to communicate with CCM via secure chat; plan to have Cortrak placed and start TF.   Weight on 10/9 was 200 lb and PTA the most recently documented weight was on 12/30/19 when he weighed 183 lb; will monitor weight trends closely and adjust estimated needs as warranted.   Per notes: - intubated in the ED on 10/9 due to acute hypoxic respiratory failure 2/2 acute encephalopathy - breakthrough seizures complicated by status epilepticus   Patient is currently intubated on ventilator support MV: 8.9 L/min Temp (24hrs), Avg:98.6 F (37 C), Min:98.2 F (36.8 C), Max:99.1 F (37.3 C) Propofol: 29.9 ml/hr (789 kcal)  Labs reviewed; CBGs: 96 and 93 mg/dl, Ca: 8.7 mg/dl, Alk Phos low. Medications reviewed; 20 mg IV pepcid BID. Drips; fentanyl @ 100 mcg/hr, versed @ 1 mg/hr, propofol @ 50 mcg/kg/min.    NUTRITION - FOCUSED PHYSICAL EXAM:  unable to  complete as RD is working on another campus.  Diet Order:   Diet Order    None      EDUCATION NEEDS:   No education needs have been identified at this time  Skin:  Skin Assessment: Reviewed RN Assessment  Last BM:  PTA/unknown  Height:   Ht Readings from Last 1 Encounters:  07/01/20 6' (1.829 m)    Weight:   Wt Readings from Last 1 Encounters:  07/01/20 90.9 kg    Estimated Nutritional Needs:  Kcal:  2149 kcal Protein:  136-154 grams Fluid:  >/= 2.2 L/day     Jarome Matin, MS, RD, LDN, CNSC Inpatient Clinical Dietitian RD pager # available in [place]  After hours/weekend pager # available in [facility]

## 2020-07-03 NOTE — Progress Notes (Signed)
maint complete - no skin breakdown under fp1, fp2, p3, pz, p4, cz

## 2020-07-03 NOTE — Progress Notes (Signed)
NAME:  Dylan Moon, MRN:  [id number], DOB:  1996-07-21, LOS: 2 ADMISSION DATE:  07/01/2020, CONSULTATION DATE:  07/01/2020 REFERRING MD:  ED, CHIEF COMPLAINT:  Persistent seizures   Brief History   This is a 24 year old with a history of seizure disorder and prior history of status who will began seizing this morning.  Apparently had 30 minutes of uninterrupted seizures before EMS arrived.  He was given benzodiazepines in transport but seizures did not resolve.  He was given further benzodiazepines on arrival in the department of emergency medicine however he subsequently became apneic and required intubation.  Despite being loaded with valproic acid and started on propofol he is still intermittently having seizures being treated with boluses of Versed. History of present illness   Have no one present to provide [place] with any history.  Appears that he has had episodes of status in the past related to noncompliance I know nothing of whether he had any concurrent illness fevers chills changes in medications or use of street drugs  Past Medical History  Epilepsy, prior status, anxiety, and ADHD.  Significant Hospital Events   Intubated in ER 10/9  Consults:  Neurology  Procedures:  None  Significant Diagnostic Tests:  CT Head 10/9 >> Negative for intracranial abnormalities   cEEG 10/9 - 10/10 >> Negative for seizures or epileptiform discharges. Severe diffuse encephalopathy.  Micro Data:  10/9 sars: negative 10/10 blood: ngtd Antimicrobials:  None   Interim history/subjective:   10/11: whole body jerking when sedation weaned last pm. Neuro recs appreciates. remains on eeg. Unresponsive on sedation  Objective   Blood pressure 105/66, pulse 66, temperature 98.6 F (37 C), temperature source Axillary, resp. rate 15, height 6' (1.829 m), weight 90.9 kg, SpO2 100 %.    Vent Mode: PRVC FiO2 (%):  [40 %] 40 % Set Rate:  [15 bmp] 15 bmp Vt Set:  [409 mL] 620 mL PEEP:  [5 cmH20] 5  cmH20 Plateau Pressure:  [19 cmH20] 19 cmH20   Intake/Output Summary (Last 24 hours) at 07/03/2020 0753 Last data filed at 07/03/2020 0600 Gross per 24 hour  Intake 1127.61 ml  Output 250 ml  Net 877.61 ml   Filed Weights   07/01/20 1019 07/01/20 2200  Weight: 99.8 kg 90.9 kg   VSS General:  In bed on vent HENT: NCAT ETT in place, Pupils pinpoint and minimally reactive PULM: CTA B, vent supported breathing CV: RRR, no mgr GI: BS+, soft, nontender, nondistended MSK: normal bulk and tone Neuro: sedated on vent   Resolved Hospital Problem list     Assessment & Plan:   # Breakthrough Seizures complicated by Status Epilepticus # Hx of Seizures  - Hx of frequent breakthrough seizures secondary to medication non-compliance - UDS also positive for [facility] - Neurology is following; appreciate their recommendations  - On admission, Valproic Acid was continued 1000 mg TID, with the addition of home medication Aptiom, brought by patient's mother - Started on Propofol gtt with continuation of Versed as well - cEEG overnight has been negative for any additional seizures or epileptiform discharges.  - Will follow up Neurology's recommendations on weaning of sedation.  # Acute Hypoxic Respiratory Failure secondary to Acute Encephalopathy - Intubated for airway protection - Full vent support  - VAP measures in place - Consider SBT when sedation has been weaned and patient able to follow commands.   Best practice:  Diet: N.p.o.  Pain/Anxiety/Delirium protocol (if indicated): Not indicated VAP protocol (if indicated): Ordered DVT  prophylaxis: Lovenox GI prophylaxis: Pepcid Glucose control: Not indicated Mobility: Bedrest Code Status: Full code Family Communication: Will update mother  Disposition: ICU  Labs   CBC: Recent Labs  Lab 07/01/20 1025 07/01/20 1119 07/01/20 1817 07/02/20 0233 07/02/20 0316 07/03/20 0319 07/03/20 0436  WBC 15.8*  --   --   --  10.5  --  10.8*    NEUTROABS 3.4  --   --   --  7.2  --  7.3  HGB 16.7   < > 15.3 15.3 14.7 14.3 13.7  HCT 57.8*   < > 45.0 45.0 44.1 42.0 42.2  MCV 99.5  --   --   --  88.2  --  88.5  PLT 259  --   --   --  196  --  163   < > = values in this interval not displayed.    Basic Metabolic Panel: Recent Labs  Lab 07/01/20 1025 07/01/20 1119 07/01/20 1817 07/02/20 0233 07/02/20 0316 07/03/20 0319 07/03/20 0436  NA 144   < > 140 141 140 141 141  K 4.5   < > 3.8 3.9 4.0 3.8 4.0  CL 103  --   --   --  106  --  106  CO2 8*  --   --   --  22  --  24  GLUCOSE 218*  --   --   --  104*  --  89  BUN 18  --   --   --  13  --  14  CREATININE 1.50*  --   --   --  1.21  --  1.14  CALCIUM 10.0  --   --   --  8.9  --  8.7*  MG 2.3  --   --   --  2.3  --  2.1   < > = values in this interval not displayed.   GFR: Estimated Creatinine Clearance: 108.7 mL/min (by C-G formula based on SCr of 1.14 mg/dL). Recent Labs  Lab 07/01/20 1025 07/02/20 0316 07/03/20 0436  WBC 15.8* 10.5 10.8*    Liver Function Tests: Recent Labs  Lab 07/01/20 1025 07/03/20 0436  AST 40 23  ALT 19 13  ALKPHOS 43 29*  BILITOT 1.1 0.3  PROT 7.9 6.0*  ALBUMIN 4.9 3.1*   No results for input(s): LIPASE, AMYLASE in the last 168 hours. No results for input(s): AMMONIA in the last 168 hours.  ABG    Component Value Date/Time   PHART 7.493 (H) 07/03/2020 0319   PCO2ART 33.2 07/03/2020 0319   PO2ART 77 (L) 07/03/2020 0319   HCO3 25.5 07/03/2020 0319   TCO2 26 07/03/2020 0319   ACIDBASEDEF 7.0 (H) 07/01/2020 1119   O2SAT 96.0 07/03/2020 0319     Coagulation Profile: Recent Labs  Lab 07/01/20 1025  INR 1.2    Cardiac Enzymes: Recent Labs  Lab 07/02/20 0538  CKTOTAL 886*    HbA1C: Hgb A1c MFr Bld  Date/Time Value Ref Range Status  12/24/2019 05:13 PM 5.8 (H) 4.8 - 5.6 % Final    Comment:    (NOTE) Pre diabetes:          5.7%-6.4% Diabetes:              >6.4% Glycemic control for   <7.0% adults with  diabetes     CBG: Recent Labs  Lab 07/01/20 1002 07/02/20 2002 07/02/20 2358 07/03/20 0357  GLUCAP 213* 95 109* 96   Critical  care time: The patient is critically ill with multiple organ systems failure and requires high complexity decision making for assessment and support, frequent evaluation and titration of therapies, application of advanced monitoring technologies and extensive interpretation of multiple databases.  Critical care time 41 mins. This represents my time independent of the NPs time taking care of the pt. This is excluding procedures.    Briant Sites DO [place] [facility] 07/03/2020, 11:19 AM

## 2020-07-03 NOTE — Progress Notes (Addendum)
NEUROLOGY CONSULTATION PROGRESS NOTE   Date of service: July 03, 2020 Patient Name: Dylan Moon MRN:  [id number] DOB:  13-Oct-1995  Brief HPI  Dylan Moon is a 24 y.o. male with PMH significant for epilepsy on Valproic acid Extended release 3000mg  daily and Aptiom 1600mg  daily who presented with breakthrough seizure progressing to status. He was given multiple doses of Versed/Ativan without cessation of seizures, ended up intubated and started on propofol sedation, which was successful at aborting clinical seizure activity. cEEG with no seizure activity.  Of note, his serum Valproic acid levels in the ED were subtherapeutic to 30. Workup for infectious causes and metabolic causes for breakthrough seizures was negative.  Interval Hx   Plan was to attempt weaning down Versed but noted to have episode of jerking last night and vent dyssynchrony. Changed course to keep Versed on board.  Vitals   Vitals:   07/03/20 0300 07/03/20 0400 07/03/20 0500 07/03/20 0600  BP: 112/72 106/69 104/64 105/66  Pulse: 74 72 68 66  Resp: 15 15 15 15   Temp:  98.6 F (37 C)    TempSrc:  Axillary    SpO2: 99% 100% 100% 100%  Weight:      Height:         Body mass index is 27.18 kg/m.  Physical Exam   General: Intubated and Sedated HENT: Normal oropharynx and mucosa. Normal external appearance of ears and nose. Neck: Supple, no pain or tenderness CV: No JVD. No peripheral edema. Pulmonary: Symmetric Chest rise. On vent. Abdomen: Soft to touch, non-tender. Ext: No cyanosis, edema, or deformity Skin: No rash. Normal palpation of skin.  Musculoskeletal: Normal digits and nails by inspection. No clubbing.  Neurologic Examination  Mental status/Cognition: No reponse to voice or loud clap. Does not open eyes to noxious stimuli.  Brainstem and cranial nerves: Pupils pinpoint BL. Non reactive to light. + corneals BL. No cough. Unable to elicit a gag. No response to noxious stimuli in any  extremities. No grimace to pain.  Labs   Basic Metabolic Panel:  Lab Results  Component Value Date   NA 141 07/03/2020   K 4.0 07/03/2020   CO2 24 07/03/2020   GLUCOSE 89 07/03/2020   BUN 14 07/03/2020   CREATININE 1.14 07/03/2020   CALCIUM 8.7 (L) 07/03/2020   GFRNONAA >60 07/03/2020   GFRAA >60 03/14/2020   HbA1c:  Lab Results  Component Value Date   HGBA1C 5.8 (H) 12/24/2019   LDL: No results found for: [facility] Urine Drug Screen:     Component Value Date/Time   LABOPIA NONE DETECTED 07/02/2020 1630   COCAINSCRNUR NONE DETECTED 07/02/2020 1630   LABBENZ POSITIVE (A) 07/02/2020 1630   AMPHETMU NONE DETECTED 07/02/2020 1630   THCU POSITIVE (A) 07/02/2020 1630   LABBARB NONE DETECTED 07/02/2020 1630    Alcohol Level     Component Value Date/Time   ETH <10 07/01/2020 1025   Lab Results  Component Value Date   PHENYTOIN <2.5 (L) 01/15/2019   Lab Results  Component Value Date   PHENYTOIN <2.5 (L) 01/15/2019   VALPROATE 122 (H) 07/02/2020    Imaging and Diagnostic studies   Results for orders placed during the hospital encounter of 07/01/20  CT Head Wo Contrast  Narrative CLINICAL DATA:  Seizure.  EXAM: CT HEAD WITHOUT CONTRAST  TECHNIQUE: Contiguous axial images were obtained from the base of the skull through the vertex without intravenous contrast.  COMPARISON:  December 24, 2019  FINDINGS: Brain: No  evidence of acute infarction, hemorrhage, hydrocephalus, extra-axial collection or mass lesion/mass effect.  Vascular: No hyperdense vessel or unexpected calcification.  Skull: Normal. Negative for fracture or focal lesion.  Sinuses/Orbits: No acute finding.  Other: None.  IMPRESSION: No focal acute intracranial abnormality identified.   Electronically Signed By: Sherian Rein M.D. On: 07/01/2020 13:37   cEEG report from 07/02/20: IMPRESSION: This study is suggestive of severe diffuse encephalopathy, nonspecific etiology but likely  related to sedation.No seizures or epileptiform discharges were seen throughout the recording.  Impression   Dylan Moon is a 24 y.o. male with PMH significant for epilepsy on Valproic acid Extended release 3000mg  daily and Aptiom 1600mg  daily who presented with breakthrough seizure progressing to status. Poor exam on Versed, propofol and Fentanyl. He has been weaned off Versed today. Will try to wean down propofol next while on cEEG.  Recommendations  - Off Versed now - Plan to wean off Propofol next at a rate of every hour. - continue Fentanyl at this time. - cEEG - continue VPA 1000mg  TID and Aptiom 1600mg  daily. - Push button event last night was not a seizure. - Updated mother over the phone today. ______________________________________________________________________  This patient is critically ill and at significant risk of neurological worsening, death and care requires constant monitoring of vital signs, hemodynamics,respiratory and cardiac monitoring, neurological assessment, discussion with family, other specialists and medical decision making of high complexity. I spent 30 minutes of neurocritical care time  in the care of  this patient. This was time spent independent of any time provided by nurse practitioner or PA.  [facility] Pager Number 07/03/2020  5:47 PM  Thank you for the opportunity to take part in the care of this patient. If you have any further questions, please contact the neurology consultation attending.  Signed,  [facility] Pager Number Erick Blinks

## 2020-07-03 NOTE — Progress Notes (Addendum)
Patient is experiencing decreased urine output. He has only had about 100 mL of output so far this shift. CCM, PA notified. Will continue to monitor. Ripley Fraise D

## 2020-07-04 DIAGNOSIS — R569 Unspecified convulsions: Secondary | ICD-10-CM

## 2020-07-04 DIAGNOSIS — G40901 Epilepsy, unspecified, not intractable, with status epilepticus: Secondary | ICD-10-CM | POA: Diagnosis not present

## 2020-07-04 LAB — GLUCOSE, CAPILLARY
Glucose-Capillary: 119 mg/dL — ABNORMAL HIGH (ref 70–99)
Glucose-Capillary: 121 mg/dL — ABNORMAL HIGH (ref 70–99)
Glucose-Capillary: 137 mg/dL — ABNORMAL HIGH (ref 70–99)
Glucose-Capillary: 146 mg/dL — ABNORMAL HIGH (ref 70–99)
Glucose-Capillary: 156 mg/dL — ABNORMAL HIGH (ref 70–99)
Glucose-Capillary: 164 mg/dL — ABNORMAL HIGH (ref 70–99)

## 2020-07-04 LAB — PHOSPHORUS
Phosphorus: 4 mg/dL (ref 2.5–4.6)
Phosphorus: 3.1 mg/dL (ref 2.5–4.6)

## 2020-07-04 LAB — PATHOLOGIST SMEAR REVIEW: Path Review: REACTIVE

## 2020-07-04 MED ORDER — BETHANECHOL CHLORIDE 10 MG PO TABS
10.0000 mg | ORAL_TABLET | Freq: Three times a day (TID) | ORAL | Status: DC
  Administered 2020-07-04 – 2020-07-05 (×4): 10 mg via ORAL
  Filled 2020-07-04 (×4): qty 1

## 2020-07-04 MED ORDER — TAMSULOSIN HCL 0.4 MG PO CAPS: 0.4000 mg | ORAL_CAPSULE | Freq: Every day | ORAL | Status: DC

## 2020-07-04 MED ORDER — DEXMEDETOMIDINE HCL IN NACL 400 MCG/100ML IV SOLN
0.4000 ug/kg/h | INTRAVENOUS | Status: DC
  Administered 2020-07-04: 1.8 ug/kg/h via INTRAVENOUS
  Administered 2020-07-04: 1.4 ug/kg/h via INTRAVENOUS
  Administered 2020-07-04: 1.8 ug/kg/h via INTRAVENOUS
  Administered 2020-07-04: 0.4 ug/kg/h via INTRAVENOUS
  Administered 2020-07-05 (×3): 2 ug/kg/h via INTRAVENOUS
  Administered 2020-07-05: 1.799 ug/kg/h via INTRAVENOUS
  Administered 2020-07-05: 2 ug/kg/h via INTRAVENOUS
  Administered 2020-07-05: 1.8 ug/kg/h via INTRAVENOUS
  Administered 2020-07-05 (×2): 2 ug/kg/h via INTRAVENOUS
  Administered 2020-07-05: 1.8 ug/kg/h via INTRAVENOUS
  Administered 2020-07-06 – 2020-07-08 (×28): 2 ug/kg/h via INTRAVENOUS
  Administered 2020-07-09: 1.6 ug/kg/h via INTRAVENOUS
  Administered 2020-07-09 (×3): 2 ug/kg/h via INTRAVENOUS
  Administered 2020-07-09 – 2020-07-10 (×7): 1.6 ug/kg/h via INTRAVENOUS
  Administered 2020-07-11 (×4): 2 ug/kg/h via INTRAVENOUS
  Administered 2020-07-11: 1.8 ug/kg/h via INTRAVENOUS
  Administered 2020-07-11 (×6): 2 ug/kg/h via INTRAVENOUS
  Administered 2020-07-12 (×2): 1.6 ug/kg/h via INTRAVENOUS
  Administered 2020-07-12: 1.7 ug/kg/h via INTRAVENOUS
  Administered 2020-07-12: 1.6 ug/kg/h via INTRAVENOUS
  Administered 2020-07-12: 1.5 ug/kg/h via INTRAVENOUS
  Administered 2020-07-12: 2 ug/kg/h via INTRAVENOUS
  Administered 2020-07-12: 1.6 ug/kg/h via INTRAVENOUS
  Administered 2020-07-12 (×2): 1.7 ug/kg/h via INTRAVENOUS
  Administered 2020-07-13: 1.5 ug/kg/h via INTRAVENOUS
  Administered 2020-07-13: 1 ug/kg/h via INTRAVENOUS
  Administered 2020-07-13: 2 ug/kg/h via INTRAVENOUS
  Administered 2020-07-13: 1.5 ug/kg/h via INTRAVENOUS
  Administered 2020-07-13: 2 ug/kg/h via INTRAVENOUS
  Administered 2020-07-13: 1.3 ug/kg/h via INTRAVENOUS
  Administered 2020-07-13 – 2020-07-14 (×2): 2 ug/kg/h via INTRAVENOUS
  Administered 2020-07-14 (×2): 1.6 ug/kg/h via INTRAVENOUS
  Administered 2020-07-14: 0.9 ug/kg/h via INTRAVENOUS
  Administered 2020-07-14 (×2): 2 ug/kg/h via INTRAVENOUS
  Administered 2020-07-14 – 2020-07-15 (×2): 1.6 ug/kg/h via INTRAVENOUS
  Filled 2020-07-04 (×2): qty 200
  Filled 2020-07-04 (×3): qty 100
  Filled 2020-07-04 (×2): qty 300
  Filled 2020-07-04: qty 100
  Filled 2020-07-04: qty 200
  Filled 2020-07-04 (×4): qty 100
  Filled 2020-07-04: qty 200
  Filled 2020-07-04: qty 100
  Filled 2020-07-04: qty 300
  Filled 2020-07-04: qty 100
  Filled 2020-07-04: qty 200
  Filled 2020-07-04 (×4): qty 100
  Filled 2020-07-04: qty 200
  Filled 2020-07-04 (×2): qty 100
  Filled 2020-07-04: qty 200
  Filled 2020-07-04 (×3): qty 100
  Filled 2020-07-04: qty 300
  Filled 2020-07-04 (×2): qty 100
  Filled 2020-07-04: qty 300
  Filled 2020-07-04 (×3): qty 100
  Filled 2020-07-04: qty 200
  Filled 2020-07-04 (×5): qty 100
  Filled 2020-07-04: qty 200
  Filled 2020-07-04 (×2): qty 100
  Filled 2020-07-04: qty 200
  Filled 2020-07-04 (×6): qty 100
  Filled 2020-07-04: qty 300
  Filled 2020-07-04 (×3): qty 100
  Filled 2020-07-04 (×3): qty 200
  Filled 2020-07-04: qty 100
  Filled 2020-07-04: qty 200
  Filled 2020-07-04 (×3): qty 100
  Filled 2020-07-04: qty 200
  Filled 2020-07-04 (×4): qty 100
  Filled 2020-07-04: qty 200
  Filled 2020-07-04: qty 100

## 2020-07-04 MED ORDER — LACTATED RINGERS IV SOLN: INTRAVENOUS | Status: DC

## 2020-07-04 NOTE — Progress Notes (Signed)
NAME:  Dylan Moon, MRN:  [id number], DOB:  [birth year], LOS: 3 ADMISSION DATE:  07/01/2020, CONSULTATION DATE:  07/01/2020 REFERRING MD:  ED, CHIEF COMPLAINT:  Persistent seizures   Brief History   This is a 24 year old with a history of seizure disorder and prior history of status who will began seizing this morning.  Apparently had 30 minutes of uninterrupted seizures before EMS arrived.  He was given benzodiazepines in transport but seizures did not resolve.  He was given further benzodiazepines on arrival in the department of emergency medicine however he subsequently became apneic and required intubation.  Despite being loaded with valproic acid and started on propofol he is still intermittently having seizures being treated with boluses of Versed. History of present illness   Have no one present to provide [place] with any history.  Appears that he has had episodes of status in the past related to noncompliance I know nothing of whether he had any concurrent illness fevers chills changes in medications or use of street drugs  Past Medical History  Epilepsy, prior status, anxiety, and ADHD.  Significant Hospital Events   Intubated in ER 10/9  Consults:  Neurology  Procedures:  None  Significant Diagnostic Tests:  CT Head 10/9 >> Negative for intracranial abnormalities   cEEG 10/9 - 10/10 >> Negative for seizures or epileptiform discharges. Severe diffuse encephalopathy.  Micro Data:  10/9 sars: negative 10/10 blood: ngtd Antimicrobials:  None   Interim history/subjective:  10/12: no further seizures. Able to wean down sedation. Does open eyes to verbal stim but non purposeful and moving when sedation weaned. On cpap  10/11: whole body jerking when sedation weaned last pm. Neuro recs appreciates. remains on eeg. Unresponsive on sedation  Objective   Blood pressure 139/66, pulse (!) 103, temperature 98.9 F (37.2 C), temperature source Axillary, resp. rate 19, height 6' (1.829  m), weight 88.7 kg, SpO2 100 %.    Vent Mode: CPAP;PSV FiO2 (%):  [40 %] 40 % Set Rate:  [15 bmp] 15 bmp Vt Set:  [620 mL] 620 mL PEEP:  [5 cmH20] 5 cmH20 Pressure Support:  [5 cmH20] 5 cmH20 Plateau Pressure:  [19 cmH20-21 cmH20] 19 cmH20   Intake/Output Summary (Last 24 hours) at 07/04/2020 0851 Last data filed at 07/04/2020 0800 Gross per 24 hour  Intake 1006.2 ml  Output 1250 ml  Net -243.8 ml   Filed Weights   07/01/20 1019 07/01/20 2200 07/04/20 0500  Weight: 99.8 kg 90.9 kg 88.7 kg   VSS General:  In bed on vent HENT: NCAT ETT in place, Pupils pinpoint  PULM: CTA B, vent supported breathing CV: RRR, no mgr GI: BS+, soft, nontender, nondistended MSK: normal bulk and tone Neuro: opens eyes to verbal stim and starts non purposeful movement.   Resolved Hospital Problem list     Assessment & Plan:   # Breakthrough Seizures complicated by Status Epilepticus # Hx of Seizures  - Hx of frequent breakthrough seizures secondary to medication non-compliance - UDS also positive for [facility] - Neurology is following; appreciate their recommendations  - On admission, Valproic Acid was continued 1000 mg TID, with the addition of home medication Aptiom, brought by patient's mother -weaning sedation but pt restless and non purposeful as weaning.  -will add precedex and uptitrate to wean propofol as tolerated. Unfortunately may be situation where we need to decrease sedation or stop and as soon as awake will extubate.  - cEEG overnight has been negative for any additional seizures  or epileptiform discharges.   # Acute Hypoxic Respiratory Failure secondary to Acute Encephalopathy - Intubated for airway protection - Full vent support  - VAP measures in place -sbt today   Best practice:  Diet: N.p.o.  Pain/Anxiety/Delirium protocol (if indicated): Not indicated VAP protocol (if indicated): Ordered DVT prophylaxis: Lovenox GI prophylaxis: Pepcid Glucose control: Not  indicated Mobility: Bedrest Code Status: Full code Family Communication: Will update mother  Disposition: ICU  Labs   CBC: Recent Labs  Lab 07/01/20 1025 07/01/20 1119 07/01/20 1817 07/02/20 0233 07/02/20 0316 07/03/20 0319 07/03/20 0436  WBC 15.8*  --   --   --  10.5  --  10.8*  NEUTROABS 3.4  --   --   --  7.2  --  7.3  HGB 16.7   < > 15.3 15.3 14.7 14.3 13.7  HCT 57.8*   < > 45.0 45.0 44.1 42.0 42.2  MCV 99.5  --   --   --  88.2  --  88.5  PLT 259  --   --   --  196  --  163   < > = values in this interval not displayed.    Basic Metabolic Panel: Recent Labs  Lab 07/01/20 1025 07/01/20 1119 07/01/20 1817 07/02/20 0233 07/02/20 0316 07/03/20 0319 07/03/20 0436 07/03/20 1320 07/03/20 1641 07/04/20 0512  NA 144   < > 140 141 140 141 141  --   --   --   K 4.5   < > 3.8 3.9 4.0 3.8 4.0  --   --   --   CL 103  --   --   --  106  --  106  --   --   --   CO2 8*  --   --   --  22  --  24  --   --   --   GLUCOSE 218*  --   --   --  104*  --  89  --   --   --   BUN 18  --   --   --  13  --  14  --   --   --   CREATININE 1.50*  --   --   --  1.21  --  1.14  --   --   --   CALCIUM 10.0  --   --   --  8.9  --  8.7*  --   --   --   MG 2.3  --   --   --  2.3  --  2.1  --   --   --   PHOS  --   --   --   --   --   --   --  3.7 3.5 4.0   < > = values in this interval not displayed.   GFR: Estimated Creatinine Clearance: 108.7 mL/min (by C-G formula based on SCr of 1.14 mg/dL). Recent Labs  Lab 07/01/20 1025 07/02/20 0316 07/03/20 0436  WBC 15.8* 10.5 10.8*    Liver Function Tests: Recent Labs  Lab 07/01/20 1025 07/03/20 0436  AST 40 23  ALT 19 13  ALKPHOS 43 29*  BILITOT 1.1 0.3  PROT 7.9 6.0*  ALBUMIN 4.9 3.1*   No results for input(s): LIPASE, AMYLASE in the last 168 hours. No results for input(s): AMMONIA in the last 168 hours.  ABG    Component Value Date/Time   PHART 7.493 (H) 07/03/2020 0319  PCO2ART 33.2 07/03/2020 0319   PO2ART 77 (L)  07/03/2020 0319   HCO3 25.5 07/03/2020 0319   TCO2 26 07/03/2020 0319   ACIDBASEDEF 7.0 (H) 07/01/2020 1119   O2SAT 96.0 07/03/2020 0319     Coagulation Profile: Recent Labs  Lab 07/01/20 1025  INR 1.2    Cardiac Enzymes: Recent Labs  Lab 07/02/20 0538  CKTOTAL 886*    HbA1C: Hgb A1c MFr Bld  Date/Time Value Ref Range Status  12/24/2019 05:13 PM 5.8 (H) 4.8 - 5.6 % Final    Comment:    (NOTE) Pre diabetes:          5.7%-6.4% Diabetes:              >6.4% Glycemic control for   <7.0% adults with diabetes     CBG: Recent Labs  Lab 07/03/20 1605 07/03/20 2012 07/03/20 2353 07/04/20 0355 07/04/20 0819  GLUCAP 81 126* 129* 119* 121*   Critical care time: The patient is critically ill with multiple organ systems failure and requires high complexity decision making for assessment and support, frequent evaluation and titration of therapies, application of advanced monitoring technologies and extensive interpretation of multiple databases.  Critical care time 43 mins. This represents my time independent of the NPs time taking care of the pt. This is excluding procedures.    Briant Sites DO [facility] 07/04/2020, 8:51 AM

## 2020-07-04 NOTE — Procedures (Addendum)
Patient Name:Han KAREE CHRISTOPHERSON BFX:[id number] Epilepsy Attending:Akiyah Eppolito Annabelle Harman Referring Physician/Provider:Dr Nilda Simmer Duration:07/03/2020 1408 to 07/04/2020  1408  Patient history:24yo M with h/o epilepsy who presented with breakthrough seizure. EEG to evaluate for seizure  Level of alertness:Comatose  AEDs during EEG study:VPA, Aptiom, versed, propofol  Technical aspects: This EEG study was done with scalp electrodes positioned according to the 10-20 International system of electrode placement. Electrical activity was acquired at a sampling rate of 500Hz  and reviewed with a high frequency filter of 70Hz  and a low frequency filter of 1Hz . EEG data were recorded continuously and digitally stored.   Description: EEG showed continuous generalized polymorphic mixed frequencies with 3-6Hz  theta-delta slowing as well as 8-9Hz  generalized alpha activity. Hyperventilation and photic stimulation were not performed.   ABNORMALITY -Continuousslow, generalized  IMPRESSION: This study is suggestive of severe diffuse encephalopathy, nonspecific etiology but likely related to sedation.No seizures or epileptiform discharges were seen throughout the recording.  EEG appears to be improving compared to previous day.   Connor Meacham 

## 2020-07-04 NOTE — Progress Notes (Signed)
NEUROLOGY CONSULTATION PROGRESS NOTE   Date of service: July 04, 2020 Patient Name: Dylan Moon MRN:  [id number] DOB:  1995/11/01  Brief HPI  Dylan Moon is a 24 y.o. male with PMH significant for epilepsy on Valproic acid Extended release 3000mg  daily and Aptiom 1600mg  daily who presented with breakthrough seizure progressing to status. He was given multiple doses of Versed/Ativan without cessation of seizures, ended up intubated and started on propofol sedation, which was successful at aborting clinical seizure activity. cEEG with no seizure activity.  Of note, his serum Valproic acid levels in the ED were subtherapeutic to 30. Workup for infectious causes and metabolic causes for breakthrough seizures was negative. Suspect related to non compliance given low VPA levels.  Weaned off Versed, currently weaning down propofol. Event that patient had while weaning off Versed was non epileptic.  Interval Hx   Propofol down from to .  He is in mitts. Opens eyes to voice, attempts to get out of bed, not able to reorient him. Pupils equal round and reactive. Moves all extremities.  Vitals   Vitals:   07/04/20 0200 07/04/20 0300 07/04/20 0400 07/04/20 0500  BP: 140/84 136/76 (!) 142/74 (!) 141/76  Pulse: 78 81 81 82  Resp: 15 15 15 15   Temp:   99.8 F (37.7 C)   TempSrc:   Axillary   SpO2: 100% 100% 100% 100%  Weight:    88.7 kg  Height:         Body mass index is 26.52 kg/m.  Physical Exam   General: Intubated and Sedated HENT: Normal oropharynx and mucosa. Normal external appearance of ears and nose. Neck: Supple, no pain or tenderness CV: No JVD. No peripheral edema. Pulmonary: Symmetric Chest rise. On vent. Abdomen: Soft to touch, non-tender. Ext: No cyanosis, edema, or deformity Skin: No rash. Normal palpation of skin.  Musculoskeletal: Normal digits and nails by inspection. No clubbing.  Neurologic Examination  Mental status/Cognition: Opens eyes  to voice, moves all extremities. Attempts to get out of bed nd agitated and not reorientable. Brainstem and cranial nerves: Pupils 48mm BL. Reactive to light. + corneals BL. Cough spontaneously on the tube. Did not attempt a gag given his agitation and concern that he could bite down on the tongue depressor.   Moves all extremities spontaneously and attempts to get out of the bed.  Labs   Basic Metabolic Panel:  Lab Results  Component Value Date   NA 141 07/03/2020   K 4.0 07/03/2020   CO2 24 07/03/2020   GLUCOSE 89 07/03/2020   BUN 14 07/03/2020   CREATININE 1.14 07/03/2020   CALCIUM 8.7 (L) 07/03/2020   GFRNONAA >60 07/03/2020   GFRAA >60 03/14/2020   HbA1c:  Lab Results  Component Value Date   HGBA1C 5.8 (H) 12/24/2019   LDL: No results found for: [facility] Urine Drug Screen:     Component Value Date/Time   LABOPIA NONE DETECTED 07/02/2020 1630   COCAINSCRNUR NONE DETECTED 07/02/2020 1630   LABBENZ POSITIVE (A) 07/02/2020 1630   AMPHETMU NONE DETECTED 07/02/2020 1630   THCU POSITIVE (A) 07/02/2020 1630   LABBARB NONE DETECTED 07/02/2020 1630    Alcohol Level     Component Value Date/Time   ETH <10 07/01/2020 1025   Lab Results  Component Value Date   PHENYTOIN <2.5 (L) 01/15/2019   Lab Results  Component Value Date   PHENYTOIN <2.5 (L) 01/15/2019   VALPROATE 122 (H) 07/02/2020    Imaging and  Diagnostic studies   Results for orders placed during the hospital encounter of 07/01/20  CT Head Wo Contrast  Narrative CLINICAL DATA:  Seizure.  EXAM: CT HEAD WITHOUT CONTRAST  TECHNIQUE: Contiguous axial images were obtained from the base of the skull through the vertex without intravenous contrast.  COMPARISON:  December 24, 2019  FINDINGS: Brain: No evidence of acute infarction, hemorrhage, hydrocephalus, extra-axial collection or mass lesion/mass effect.  Vascular: No hyperdense vessel or unexpected calcification.  Skull: Normal. Negative for fracture  or focal lesion.  Sinuses/Orbits: No acute finding.  Other: None.  IMPRESSION: No focal acute intracranial abnormality identified.   Electronically Signed By: Sherian Rein M.D. On: 07/01/2020 13:37   cEEG report from 07/02/20: IMPRESSION: This study is suggestive of severe diffuse encephalopathy, nonspecific etiology but likely related to sedation.No seizures or epileptiform discharges were seen throughout the recording.  Impression   Dylan Moon is a 24 y.o. male with PMH significant for epilepsy on Valproic acid Extended release 3000mg  daily and Aptiom 1600mg  daily who presented with breakthrough seizure progressing to status. Poor exam on Versed, propofol and Fentanyl. He has been weaned off Versed today. Will try to wean down propofol next while on cEEG.  Recommendations  - Off Versed now - Weaning down propofol as able. - Can attempt extubation today or SBT if okay with PCCM. - cEEG - Continue VPA 1000mg  TID and Aptiom 1600mg  daily. ______________________________________________________________________  This patient is critically ill and at significant risk of neurological worsening, death and care requires constant monitoring of vital signs, hemodynamics,respiratory and cardiac monitoring, neurological assessment, discussion with family, other specialists and medical decision making of high complexity. I spent 20 minutes of neurocritical care time  in the care of  this patient. This was time spent independent of any time provided by nurse practitioner or PA.  [facility] Pager Number 07/04/2020  7:32 AM  Thank you for the opportunity to take part in the care of this patient. If you have any further questions, please contact the neurology consultation attending.  Signed,  [facility] Pager Number Erick Blinks

## 2020-07-04 NOTE — Progress Notes (Signed)
EEG maintenance complete. No skin breakdown at electrode sites Fp1 Fz, Cz, P3 O1

## 2020-07-05 DIAGNOSIS — G40901 Epilepsy, unspecified, not intractable, with status epilepticus: Secondary | ICD-10-CM | POA: Diagnosis not present

## 2020-07-05 DIAGNOSIS — R569 Unspecified convulsions: Secondary | ICD-10-CM | POA: Diagnosis not present

## 2020-07-05 LAB — GLUCOSE, CAPILLARY
Glucose-Capillary: 112 mg/dL — ABNORMAL HIGH (ref 70–99)
Glucose-Capillary: 120 mg/dL — ABNORMAL HIGH (ref 70–99)
Glucose-Capillary: 133 mg/dL — ABNORMAL HIGH (ref 70–99)
Glucose-Capillary: 138 mg/dL — ABNORMAL HIGH (ref 70–99)
Glucose-Capillary: 155 mg/dL — ABNORMAL HIGH (ref 70–99)
Glucose-Capillary: 161 mg/dL — ABNORMAL HIGH (ref 70–99)

## 2020-07-05 LAB — TRIGLYCERIDES: Triglycerides: 252 mg/dL — ABNORMAL HIGH (ref <150)

## 2020-07-05 LAB — VALPROIC ACID LEVEL: Valproic Acid Lvl: 86 ug/mL (ref 50.0–100.0)

## 2020-07-05 MED ORDER — QUETIAPINE FUMARATE 25 MG PO TABS
25.0000 mg | ORAL_TABLET | Freq: Two times a day (BID) | ORAL | Status: DC
  Administered 2020-07-05 – 2020-07-08 (×8): 25 mg via ORAL
  Filled 2020-07-05 (×8): qty 1

## 2020-07-05 MED ORDER — ESCITALOPRAM OXALATE 10 MG PO TABS
5.0000 mg | ORAL_TABLET | Freq: Every day | ORAL | Status: DC
  Administered 2020-07-05 – 2020-07-10 (×6): 5 mg via ORAL
  Filled 2020-07-05 (×6): qty 1

## 2020-07-05 MED ORDER — BUSPIRONE HCL 15 MG PO TABS
7.5000 mg | ORAL_TABLET | Freq: Two times a day (BID) | ORAL | Status: DC
  Administered 2020-07-05 – 2020-07-11 (×12): 7.5 mg via ORAL
  Filled 2020-07-05 (×12): qty 1

## 2020-07-05 MED ORDER — LORAZEPAM 2 MG/ML IJ SOLN
INTRAMUSCULAR | Status: AC
  Administered 2020-07-05: 2 mg
  Filled 2020-07-05: qty 1

## 2020-07-05 MED ORDER — ALPRAZOLAM 0.5 MG PO TABS
2.0000 mg | ORAL_TABLET | Freq: Three times a day (TID) | ORAL | Status: DC
  Administered 2020-07-05 (×3): 2 mg via ORAL
  Filled 2020-07-05 (×3): qty 4

## 2020-07-05 NOTE — Progress Notes (Signed)
NAME:  Dylan Moon, MRN:  [id number], DOB:  [birth year], LOS: 4 ADMISSION DATE:  07/01/2020, CONSULTATION DATE:  07/01/2020 REFERRING MD:  ED, CHIEF COMPLAINT:  Persistent seizures   Brief History   This is a 24 year old with a history of seizure disorder and prior history of status who will began seizing this morning.  Apparently had 30 minutes of uninterrupted seizures before EMS arrived.  He was given benzodiazepines in transport but seizures did not resolve.  He was given further benzodiazepines on arrival in the department of emergency medicine however he subsequently became apneic and required intubation.  Despite being loaded with valproic acid and started on propofol he is still intermittently having seizures being treated with boluses of Versed. History of present illness   Have no one present to provide [place] with any history.  Appears that he has had episodes of status in the past related to noncompliance I know nothing of whether he had any concurrent illness fevers chills changes in medications or use of street drugs  Past Medical History  Epilepsy, prior status, anxiety, and ADHD.  Significant Hospital Events   Intubated in ER 10/9  Consults:  Neurology  Procedures:  None  Significant Diagnostic Tests:  CT Head 10/9 >> Negative for intracranial abnormalities   cEEG 10/9 - 10/10 >> Negative for seizures or epileptiform discharges. Severe diffuse encephalopathy.  Micro Data:  10/9 sars: negative 10/10 blood: ngtd Antimicrobials:  None   Interim history/subjective:  10/13: awake and following some commands, tolerating cpap and will move to extubate. Pt is agitated and req high dose precedex as well as some prn ativan at this time. Will also req 5 point restraints.  10/12: no further seizures. Able to wean down sedation. Does open eyes to verbal stim but non purposeful and moving when sedation weaned. On cpap  10/11: whole body jerking when sedation weaned last pm. Neuro  recs appreciates. remains on eeg. Unresponsive on sedation  Objective   Blood pressure (!) 159/94, pulse 93, temperature 99.3 F (37.4 C), resp. rate (!) 26, height 6' (1.829 m), weight 89.2 kg, SpO2 100 %.    Vent Mode: PRVC FiO2 (%):  [40 %] 40 % Set Rate:  [15 bmp] 15 bmp Vt Set:  [620 mL] 620 mL PEEP:  [5 cmH20] 5 cmH20 Pressure Support:  [5 cmH20] 5 cmH20 Plateau Pressure:  [19 cmH20-22 cmH20] 22 cmH20   Intake/Output Summary (Last 24 hours) at 07/05/2020 0935 Last data filed at 07/05/2020 0800 Gross per 24 hour  Intake 4458.43 ml  Output 1630 ml  Net 2828.43 ml   Filed Weights   07/01/20 2200 07/04/20 0500 07/05/20 0500  Weight: 90.9 kg 88.7 kg 89.2 kg   VSS General:  In bed on vent, nodding and shaking head, seemingly appropriately  HENT: NCAT ETT in place, PERRL PULM: CTA B, vent supported breathing CV: RRR, no mgr GI: BS+, soft, nontender, nondistended MSK: normal bulk and tone Neuro: opens eyes to verbal stim, follows some commands. Strong bilaterally.   Resolved Hospital Problem list     Assessment & Plan:   # Breakthrough Seizures complicated by Status Epilepticus # Hx of Seizures  - Hx of frequent breakthrough seizures secondary to medication non-compliance - UDS also positive for [facility] - Neurology is following; appreciate their recommendations  - On admission, Valproic Acid was continued 1000 mg TID, with the addition of home medication Aptiom, brought by patient's mother -cont precedex for extubation and staff and pt safety.  -may  need prn ativan as well.    # Acute Hypoxic Respiratory Failure secondary to Acute Encephalopathy - Intubated for airway protection - tolerating cpap - extubate today  Best practice:  Diet: N.p.o.  Pain/Anxiety/Delirium protocol (if indicated): Not indicated VAP protocol (if indicated): not indicated DVT prophylaxis: Lovenox GI prophylaxis: Pepcid Glucose control: Not indicated Mobility: Bedrest Code Status: Full  code Family Communication: Will update mother  Disposition: ICU  Labs   CBC: Recent Labs  Lab 07/01/20 1025 07/01/20 1119 07/01/20 1817 07/02/20 0233 07/02/20 0316 07/03/20 0319 07/03/20 0436  WBC 15.8*  --   --   --  10.5  --  10.8*  NEUTROABS 3.4  --   --   --  7.2  --  7.3  HGB 16.7   < > 15.3 15.3 14.7 14.3 13.7  HCT 57.8*   < > 45.0 45.0 44.1 42.0 42.2  MCV 99.5  --   --   --  88.2  --  88.5  PLT 259  --   --   --  196  --  163   < > = values in this interval not displayed.    Basic Metabolic Panel: Recent Labs  Lab 07/01/20 1025 07/01/20 1119 07/01/20 1817 07/02/20 0233 07/02/20 0316 07/03/20 0319 07/03/20 0436 07/03/20 1320 07/03/20 1641 07/04/20 0512 07/04/20 1648  NA 144   < > 140 141 140 141 141  --   --   --   --   K 4.5   < > 3.8 3.9 4.0 3.8 4.0  --   --   --   --   CL 103  --   --   --  106  --  106  --   --   --   --   CO2 8*  --   --   --  22  --  24  --   --   --   --   GLUCOSE 218*  --   --   --  104*  --  89  --   --   --   --   BUN 18  --   --   --  13  --  14  --   --   --   --   CREATININE 1.50*  --   --   --  1.21  --  1.14  --   --   --   --   CALCIUM 10.0  --   --   --  8.9  --  8.7*  --   --   --   --   MG 2.3  --   --   --  2.3  --  2.1  --   --   --   --   PHOS  --   --   --   --   --   --   --  3.7 3.5 4.0 3.1   < > = values in this interval not displayed.   GFR: Estimated Creatinine Clearance: 108.7 mL/min (by C-G formula based on SCr of 1.14 mg/dL). Recent Labs  Lab 07/01/20 1025 07/02/20 0316 07/03/20 0436  WBC 15.8* 10.5 10.8*    Liver Function Tests: Recent Labs  Lab 07/01/20 1025 07/03/20 0436  AST 40 23  ALT 19 13  ALKPHOS 43 29*  BILITOT 1.1 0.3  PROT 7.9 6.0*  ALBUMIN 4.9 3.1*   No results for input(s): LIPASE, AMYLASE in the last  168 hours. No results for input(s): AMMONIA in the last 168 hours.  ABG    Component Value Date/Time   PHART 7.493 (H) 07/03/2020 0319   PCO2ART 33.2 07/03/2020 0319    PO2ART 77 (L) 07/03/2020 0319   HCO3 25.5 07/03/2020 0319   TCO2 26 07/03/2020 0319   ACIDBASEDEF 7.0 (H) 07/01/2020 1119   O2SAT 96.0 07/03/2020 0319     Coagulation Profile: Recent Labs  Lab 07/01/20 1025  INR 1.2    Cardiac Enzymes: Recent Labs  Lab 07/02/20 0538  CKTOTAL 886*    HbA1C: Hgb A1c MFr Bld  Date/Time Value Ref Range Status  12/24/2019 05:13 PM 5.8 (H) 4.8 - 5.6 % Final    Comment:    (NOTE) Pre diabetes:          5.7%-6.4% Diabetes:              >6.4% Glycemic control for   <7.0% adults with diabetes     CBG: Recent Labs  Lab 07/04/20 1609 07/04/20 1954 07/04/20 2340 07/05/20 0349 07/05/20 0826  GLUCAP 137* 164* 156* 161* 138*   Critical care time: The patient is critically ill with multiple organ systems failure and requires high complexity decision making for assessment and support, frequent evaluation and titration of therapies, application of advanced monitoring technologies and extensive interpretation of multiple databases.  Critical care time 41 mins. This represents my time independent of the NPs time taking care of the pt. This is excluding procedures.    Briant Sites DO [facility] 07/05/2020, 9:35 AM

## 2020-07-05 NOTE — Progress Notes (Signed)
vLTM discontinue.  No skin breakdown noted at d/c

## 2020-07-05 NOTE — Procedures (Addendum)
Patient Name:Dylan Moon DEY:[id number] Epilepsy Attending:Jeffery Bachmeier Annabelle Harman Referring Physician/Provider:Dr Nilda Simmer Duration:07/04/2020 1408 to 07/05/2020 1408  Patient history:24yo M with h/o epilepsy who presented with breakthrough seizure. EEG to evaluate for seizure  Level of alertness:Comatose  AEDs during EEG study:VPA,Aptiom,propofol  Technical aspects: This EEG study was done with scalp electrodes positioned according to the 10-20 International system of electrode placement. Electrical activity was acquired at a sampling rate of 500Hz  and reviewed with a high frequency filter of 70Hz  and a low frequency filter of 1Hz . EEG data were recorded continuously and digitally stored.   Description: EEG showed continuous high amplitude rhythmic 1 to 2 Hz delta activity. Hyperventilation and photic stimulation were not performed.  ABNORMALITY -Continuous rhythmic deltaslow, generalized  IMPRESSION: This study is suggestive of severe diffuse encephalopathy, nonspecific etiology but likely related to sedation.No seizures or epileptiform discharges were seen throughout the recording.  Dara Beidleman 

## 2020-07-05 NOTE — Progress Notes (Addendum)
Adding seroquel and xanax at this time 2/2 agitation despite 1.8 precedex  Qt interval ok

## 2020-07-05 NOTE — Procedures (Signed)
Extubation Procedure Note  Patient Details:   Name: Dylan Moon DOB: 1995-10-08 MRN: [id number]   Airway Documentation:    Vent end date: 07/05/20 Vent end time: 0855   Evaluation  O2 sats: stable throughout Complications: No apparent complications Patient did tolerate procedure well. Bilateral Breath Sounds: Clear   Yes   Positive cuff leak noted before extubation. MD extubated patient with RN and RT at bedside. Patient tolerated fairly well. Sats 96% on RA. No stridor noted at this time. RT will continue to monitor as needed.   Jaquelyn Bitter 07/05/2020, 9:03 AM

## 2020-07-05 NOTE — Progress Notes (Signed)
Brief Neuro Update:  Extubated today in the AM. We will discontinue LTM. Clinically monitor for seizures.  Erick Blinks [facility] Pager Number [phone]

## 2020-07-06 ENCOUNTER — Inpatient Hospital Stay (HOSPITAL_COMMUNITY): Payer: Medicaid Other

## 2020-07-06 DIAGNOSIS — G40901 Epilepsy, unspecified, not intractable, with status epilepticus: Secondary | ICD-10-CM | POA: Diagnosis not present

## 2020-07-06 DIAGNOSIS — J189 Pneumonia, unspecified organism: Secondary | ICD-10-CM

## 2020-07-06 DIAGNOSIS — R569 Unspecified convulsions: Secondary | ICD-10-CM | POA: Diagnosis not present

## 2020-07-06 LAB — CBC WITH DIFFERENTIAL/PLATELET
Abs Immature Granulocytes: 0.04 10*3/uL (ref 0.00–0.07)
Basophils Absolute: 0 10*3/uL (ref 0.0–0.1)
Basophils Relative: 0 %
Eosinophils Absolute: 0.1 10*3/uL (ref 0.0–0.5)
Eosinophils Relative: 1 %
HCT: 42.3 % (ref 39.0–52.0)
Hemoglobin: 13.8 g/dL (ref 13.0–17.0)
Immature Granulocytes: 1 %
Lymphocytes Relative: 17 %
Lymphs Abs: 1.3 10*3/uL (ref 0.7–4.0)
MCH: 28.5 pg (ref 26.0–34.0)
MCHC: 32.6 g/dL (ref 30.0–36.0)
MCV: 87.4 fL (ref 80.0–100.0)
Monocytes Absolute: 1.2 10*3/uL — ABNORMAL HIGH (ref 0.1–1.0)
Monocytes Relative: 16 %
Neutro Abs: 5.2 10*3/uL (ref 1.7–7.7)
Neutrophils Relative %: 65 %
Platelets: 218 10*3/uL (ref 150–400)
RBC: 4.84 MIL/uL (ref 4.22–5.81)
RDW: 12.4 % (ref 11.5–15.5)
WBC: 7.9 10*3/uL (ref 4.0–10.5)
nRBC: 0 % (ref 0.0–0.2)

## 2020-07-06 LAB — GLUCOSE, CAPILLARY
Glucose-Capillary: 111 mg/dL — ABNORMAL HIGH (ref 70–99)
Glucose-Capillary: 103 mg/dL — ABNORMAL HIGH (ref 70–99)
Glucose-Capillary: 124 mg/dL — ABNORMAL HIGH (ref 70–99)
Glucose-Capillary: 122 mg/dL — ABNORMAL HIGH (ref 70–99)
Glucose-Capillary: 108 mg/dL — ABNORMAL HIGH (ref 70–99)
Glucose-Capillary: 101 mg/dL — ABNORMAL HIGH (ref 70–99)

## 2020-07-06 LAB — PROCALCITONIN: Procalcitonin: 0.12 ng/mL

## 2020-07-06 MED ORDER — ALPRAZOLAM 0.5 MG PO TABS
2.0000 mg | ORAL_TABLET | Freq: Once | ORAL | Status: DC
  Filled 2020-07-06: qty 4

## 2020-07-06 MED ORDER — ALPRAZOLAM 0.5 MG PO TABS: 2.0000 mg | ORAL_TABLET | Freq: Three times a day (TID) | ORAL | Status: DC

## 2020-07-06 MED ORDER — HALOPERIDOL LACTATE 5 MG/ML IJ SOLN: 5.0000 mg | Freq: Once | INTRAMUSCULAR | Status: AC

## 2020-07-06 MED ORDER — ALPRAZOLAM 0.5 MG PO TABS
2.0000 mg | ORAL_TABLET | Freq: Four times a day (QID) | ORAL | Status: DC
  Administered 2020-07-06 – 2020-07-08 (×7): 2 mg via ORAL
  Filled 2020-07-06 (×9): qty 4

## 2020-07-06 MED ORDER — FAMOTIDINE 20 MG PO TABS
20.0000 mg | ORAL_TABLET | Freq: Two times a day (BID) | ORAL | Status: DC
  Administered 2020-07-06 – 2020-07-11 (×10): 20 mg via ORAL
  Filled 2020-07-06 (×10): qty 1

## 2020-07-06 MED ORDER — PIPERACILLIN-TAZOBACTAM 3.375 G IVPB
3.3750 g | Freq: Three times a day (TID) | INTRAVENOUS | Status: DC
  Administered 2020-07-06 – 2020-07-09 (×8): 3.375 g via INTRAVENOUS
  Filled 2020-07-06 (×8): qty 50

## 2020-07-06 MED ORDER — CHLORHEXIDINE GLUCONATE 0.12 % MT SOLN
15.0000 mL | Freq: Two times a day (BID) | OROMUCOSAL | Status: DC
  Administered 2020-07-06 – 2020-07-11 (×11): 15 mL via OROMUCOSAL
  Filled 2020-07-06 (×8): qty 15

## 2020-07-06 MED ORDER — VITAL 1.5 CAL PO LIQD
1000.0000 mL | ORAL | Status: DC
  Administered 2020-07-06: 1000 mL
  Filled 2020-07-06: qty 1000

## 2020-07-06 MED ORDER — SODIUM CHLORIDE 0.9 % IV SOLN: 2.0000 g | Freq: Three times a day (TID) | INTRAVENOUS | Status: DC

## 2020-07-06 MED ORDER — HALOPERIDOL LACTATE 5 MG/ML IJ SOLN
INTRAMUSCULAR | Status: AC
  Administered 2020-07-06: 5 mg via INTRAMUSCULAR
  Filled 2020-07-06: qty 1

## 2020-07-06 MED ORDER — HALOPERIDOL LACTATE 5 MG/ML IJ SOLN: 2.0000 mg | Freq: Once | INTRAMUSCULAR | Status: DC

## 2020-07-06 MED ORDER — LORAZEPAM 2 MG/ML IJ SOLN
INTRAMUSCULAR | Status: AC
  Administered 2020-07-06: 2 mg
  Filled 2020-07-06: qty 1

## 2020-07-06 MED ORDER — PROSOURCE TF PO LIQD
45.0000 mL | Freq: Every day | ORAL | Status: DC
  Filled 2020-07-06: qty 45

## 2020-07-06 MED ORDER — ORAL CARE MOUTH RINSE
15.0000 mL | Freq: Two times a day (BID) | OROMUCOSAL | Status: DC
  Administered 2020-07-06 – 2020-07-10 (×10): 15 mL via OROMUCOSAL

## 2020-07-06 MED ORDER — ALPRAZOLAM 0.5 MG PO TABS
2.0000 mg | ORAL_TABLET | Freq: Once | ORAL | Status: AC
  Administered 2020-07-06: 2 mg

## 2020-07-06 MED ORDER — LORAZEPAM 2 MG/ML IJ SOLN: 2.0000 mg | Freq: Once | INTRAMUSCULAR | Status: AC

## 2020-07-06 NOTE — Progress Notes (Signed)
eLink Physician-Brief Progress Note Patient Name: Dylan Moon DOB: [birth year] MRN: [id number]   Date of Service  07/06/2020  HPI/Events of Note  Agitated delirium. He pulled out his iv. Last QTc was 0.4.  eICU Interventions  Haldol 5 mg im x 1, Xanax 2 mg via [place] x 1.        [name] 07/06/2020, 4:19 AM

## 2020-07-06 NOTE — Progress Notes (Signed)
NEUROLOGY CONSULTATION PROGRESS NOTE   Date of service: July 06, 2020 Patient Name: Dylan Moon MRN:  [id number] DOB:  06/20/1996  Brief HPI  Dylan Moon is a 24 y.o. male with PMH significant for epilepsy on Valproic acid Extended release 3000mg  daily and Aptiom 1600mg  daily who presented with breakthrough seizure progressing to status. He was given multiple doses of Versed/Ativan without cessation of seizures, ended up intubated and started on propofol sedation, which was successful at aborting clinical seizure activity. cEEG with no seizure activity.  Of note, his serum Valproic acid levels in the ED were subtherapeutic to 30. Workup for infectious causes and metabolic causes for breakthrough seizures was negative. Suspect related to non compliance given low VPA levels.  Weaned off Versed, propofol, extubated and agitated now.  Interval Hx   In 5 point restraints. Was febrile yesterday afternoon to Tmax of 102.4. CXR with Pneumonia.  Vitals   Vitals:   07/06/20 0500 07/06/20 0600 07/06/20 0700 07/06/20 0800  BP: (!) 147/77 (!) 141/82 (!) 168/98   Pulse: 78 65 71 77  Resp: (!) 39 (!) 30 (!) 36 (!) 24  Temp:    99.9 F (37.7 C)  TempSrc:    Axillary  SpO2: 94% 96% 94% 97%  Weight: 89.1 kg     Height:         Body mass index is 26.64 kg/m.  Physical Exam   General: extubated, opens eyes to voice, maintains wakefullness, agitated. HENT: Normal oropharynx and mucosa. Normal external appearance of ears and nose. Neck: Supple, no pain or tenderness CV: No JVD. No peripheral edema. Pulmonary: Symmetric Chest rise. On vent. Abdomen: Soft to touch, non-tender. Ext: No cyanosis, edema, or deformity Skin: No rash. Normal palpation of skin.  Musculoskeletal: Normal digits and nails by inspection. No clubbing.  Neurologic Examination  Mental status/Cognition:    Neurologic Examination  Mental status/Cognition: Eyes closed, opens eyes to voice, very agitated upon  awakening and attempting to get out of bed. Is in 4 pioint restraints. Oriented to self. Had to cut short my exam due to agitation. Speech/language: comprehension intact to simple commands. No slurred speech. Cranial nerves:   CN II Pupils equal and reactive to light, unable to assess VF with his agitation.   CN III,IV,VI EOM intact, no gaze preference or deviation, no nystagmus   CN V    CN VII Symmetric grimace   CN VIII normal hearing to speech   CN IX & X    CN XI    CN XII    Motor:  Muscle bulk: normal  Moves all extremities spontaneously and antigravity. He is attempting to get out of the bed.  Sensation:  Light touch Intact rhoughout.   Pin prick    Temperature    Vibration   Proprioception    Coordination/Complex Motor:  - unable to assess  Labs   Basic Metabolic Panel:  Lab Results  Component Value Date   NA 141 07/03/2020   K 4.0 07/03/2020   CO2 24 07/03/2020   GLUCOSE 89 07/03/2020   BUN 14 07/03/2020   CREATININE 1.14 07/03/2020   CALCIUM 8.7 (L) 07/03/2020   GFRNONAA >60 07/03/2020   GFRAA >60 03/14/2020   HbA1c:  Lab Results  Component Value Date   HGBA1C 5.8 (H) 12/24/2019   LDL: No results found for: [place] Urine Drug Screen:     Component Value Date/Time   LABOPIA NONE DETECTED 07/02/2020 1630   COCAINSCRNUR NONE DETECTED 07/02/2020  1630   LABBENZ POSITIVE (A) 07/02/2020 1630   AMPHETMU NONE DETECTED 07/02/2020 1630   THCU POSITIVE (A) 07/02/2020 1630   LABBARB NONE DETECTED 07/02/2020 1630    Alcohol Level     Component Value Date/Time   [facility] <10 07/01/2020 1025   Lab Results  Component Value Date   PHENYTOIN <2.5 (L) 01/15/2019   Lab Results  Component Value Date   PHENYTOIN <2.5 (L) 01/15/2019   VALPROATE 86 07/05/2020    Imaging and Diagnostic studies   Results for orders placed during the hospital encounter of 07/01/20  CT Head Wo Contrast  Narrative CLINICAL DATA:  Seizure.  EXAM: CT HEAD WITHOUT  CONTRAST  TECHNIQUE: Contiguous axial images were obtained from the base of the skull through the vertex without intravenous contrast.  COMPARISON:  December 24, 2019  FINDINGS: Brain: No evidence of acute infarction, hemorrhage, hydrocephalus, extra-axial collection or mass lesion/mass effect.  Vascular: No hyperdense vessel or unexpected calcification.  Skull: Normal. Negative for fracture or focal lesion.  Sinuses/Orbits: No acute finding.  Other: None.  IMPRESSION: No focal acute intracranial abnormality identified.   Electronically Signed By: Sherian Rein M.D. On: 07/01/2020 13:37   cEEG report from 07/02/20: IMPRESSION: This study is suggestive of severe diffuse encephalopathy, nonspecific etiology but likely related to sedation.No seizures or epileptiform discharges were seen throughout the recording.  Impression   Dylan Moon is a 24 y.o. male with PMH significant for epilepsy on Valproic acid Extended release 3000mg  daily and Aptiom 1600mg  daily who presented with breakthrough seizure progressing to status. Extubated and weaned off sedation.  Recommendations  - cEEG was discontinued - No clinical seizures after extubation - No driving for 6 months - Continue VPA 1000mg  TID and Aptiom 1600mg  daily. - Would recommend alternative to Cefepime 2/2 its potential to cause seizures.  ______________________________________________________________________  Thank you for the opportunity to take part in the care of this patient. If you have any further questions, please contact the neurology consultation attending.  Signed,  [facility] Pager Number 

## 2020-07-06 NOTE — Progress Notes (Signed)
Nutrition Follow-up  DOCUMENTATION CODES:   Not applicable  INTERVENTION:  Continue TF via Cortrak: -Vital 1.5 @ 11m/hr (15632m -4573mrosource TF daily  Provides 2380 kcals, 116 grams protein, 1191m67mee water Meets 100% of needs   Will monitor for diet advancement and order supplements as appropriate.   NUTRITION DIAGNOSIS:   Inadequate oral intake related to inability to eat as evidenced by NPO status.  ongoing  GOAL:   Patient will meet greater than or equal to 90% of their needs  Met with TF  MONITOR:   Vent status, Labs, Weight trends  REASON FOR ASSESSMENT:   Ventilator, Consult Enteral/tube feeding initiation and management  ASSESSMENT:   Pt admitted with breakthrough seizures complicated by status epilepticus. PMH includes seizure disorder, ADHD, and scoliosis.  10/9 intubated 10/11 Cortrak (gastric) 10/13 extubated  Pt unable to answer RD questions at this time. Discussed pt with RN. [name] mentation has not improved enough for po trials. Suspect pt will be able to have a diet once this improves. Recommend continuing TF via Cortrak. Current TF orders: Vital 1.5 cal @ 55ml38m 45ml 68mource TF 5x/day  UOP: 2675ml x64mours I/O: +5022.3ml sin37madmit  Labs reviewed.  Medications: pepcid IVF: LR @ 50ml/hr 80mRITION - FOCUSED PHYSICAL EXAM:    Most Recent Value  Orbital Region No depletion  Upper Arm Region No depletion  Thoracic and Lumbar Region No depletion  Buccal Region No depletion  Temple Region No depletion  Clavicle Bone Region No depletion  Clavicle and Acromion Bone Region No depletion  Scapular Bone Region No depletion  Dorsal Hand No depletion  Patellar Region No depletion  Anterior Thigh Region Mild depletion  Posterior Calf Region Mild depletion  Edema (RD Assessment) None  Hair Reviewed  Eyes Reviewed  Mouth Reviewed  Skin Reviewed  Nails Reviewed       Diet Order:   Diet Order    None      EDUCATION NEEDS:    No education needs have been identified at this time  Skin:  Skin Assessment: Reviewed RN Assessment  Last BM:  PTA/unknown  Height:   Ht Readings from Last 1 Encounters:  07/01/20 6' (1.829 m)    Weight:   Wt Readings from Last 1 Encounters:  07/06/20 89.1 kg   BMI:  Body mass index is 26.64 kg/m.  Estimated Nutritional Needs:   Kcal:  2200-2400  Protein:  110-120 grams  Fluid:  >/= 2.2 L/day    Jinan Biggins AvLarkin Ina LDN RD pager number and weekend/on-call pager number located [email or web address]

## 2020-07-06 NOTE — Procedures (Signed)
Patient Name:Dylan Moon CWU:[id number] Epilepsy Attending:Bellamy Judson Annabelle Harman Referring Physician/Provider:Dr Nilda Simmer Duration:07/05/2020 1408 to 10/14/20211826  Patient history:24yo M with h/o epilepsy who presented with breakthrough seizure. EEG to evaluate for seizure  Level of alertness:Comatose  AEDs during EEG study:VPA,Aptiom, Xanax  Technical aspects: This EEG study was done with scalp electrodes positioned according to the 10-20 International system of electrode placement. Electrical activity was acquired at a sampling rate of 500Hz  and reviewed with a high frequency filter of 70Hz  and a low frequency filter of 1Hz . EEG data were recorded continuously and digitally stored.   Description: EEG showed continuous high amplitude rhythmic 1 to 2 Hz delta activity.Hyperventilation and photic stimulation were not performed.  ABNORMALITY -Continuous rhythmic deltaslow, generalized  IMPRESSION: This study is suggestive of severe diffuse encephalopathy, nonspecific etiology but likely related to sedation.No seizures or epileptiform discharges were seen throughout the recording.  Ravis Herne 

## 2020-07-06 NOTE — Progress Notes (Signed)
NAME:  Dylan Moon, MRN:  [id number], DOB:  [birth year], LOS: 5 ADMISSION DATE:  07/01/2020, CONSULTATION DATE:  07/01/2020 REFERRING MD:  ED, CHIEF COMPLAINT:  Persistent seizures   Brief History   This is a 24 year old with a history of seizure disorder and prior history of status who will began seizing this morning.  Apparently had 30 minutes of uninterrupted seizures before EMS arrived.  He was given benzodiazepines in transport but seizures did not resolve.  He was given further benzodiazepines on arrival in the department of emergency medicine however he subsequently became apneic and required intubation.  Despite being loaded with valproic acid and started on propofol he is still intermittently having seizures being treated with boluses of Versed. History of present illness   Have no one present to provide [place] with any history.  Appears that he has had episodes of status in the past related to noncompliance I know nothing of whether he had any concurrent illness fevers chills changes in medications or use of street drugs  Past Medical History  Epilepsy, prior status, anxiety, and ADHD.  Significant Hospital Events   Intubated in ER 10/9  Consults:  Neurology  Procedures:  None  Significant Diagnostic Tests:  CT Head 10/9 >> Negative for intracranial abnormalities   cEEG 10/9 - 10/10 >> Negative for seizures or epileptiform discharges. Severe diffuse encephalopathy.  Micro Data:  10/9 sars: negative 10/10 blood: ngtd Antimicrobials:  None   Interim history/subjective:  10/14: pt did spike temp yesterday but since has been decreasing. He remains agitated although the anxiolytic given has been helpful. He remains on precedex. Today his only complaint is the need to "get out by Sunday because I'm in the [employer]".  10/13: awake and following some commands, tolerating cpap and will move to extubate. Pt is agitated and req high dose precedex as well as some prn ativan at this time.  Will also req 5 point restraints.  10/12: no further seizures. Able to wean down sedation. Does open eyes to verbal stim but non purposeful and moving when sedation weaned. On cpap  10/11: whole body jerking when sedation weaned last pm. Neuro recs appreciates. remains on eeg. Unresponsive on sedation  Objective   Blood pressure (!) 168/98, pulse 71, temperature 100 F (37.8 C), temperature source Axillary, resp. rate (!) 36, height 6' (1.829 m), weight 89.1 kg, SpO2 94 %.        Intake/Output Summary (Last 24 hours) at 07/06/2020 0756 Last data filed at 07/06/2020 0700 Gross per 24 hour  Intake 4778.33 ml  Output 2675 ml  Net 2103.33 ml   Filed Weights   07/04/20 0500 07/05/20 0500 07/06/20 0500  Weight: 88.7 kg 89.2 kg 89.1 kg   VSS General: awake alert but delirious, calling out oriented to self only HENT: NCAT eomi, perrl PULM: CTA B, on RA CV: RRR, no mgr GI: BS+, soft, nontender, nondistended MSK: normal bulk and tone Neuro: awake and restless in bed. In 5 point restraints on precedex   Resolved Hospital Problem list     Assessment & Plan:   # Breakthrough Seizures complicated by Status Epilepticus # Hx of Seizures  - Hx of frequent breakthrough seizures secondary to medication non-compliance - UDS also positive for [facility] - Neurology is following; appreciate their recommendations  - On admission, Valproic Acid was continued 1000 mg TID, with the addition of home medication Aptiom, brought by patient's mother -cont precedex for staff and pt safety at this time -cont seroquel  and xanax at this time -may need prn ativan as well.  -will check labs tomorrow if pt allows    # Acute Hypoxic Respiratory Failure secondary to Acute Encephalopathy - resolved -on RA -cxr pending 2/2 temp  Best practice:  Diet: N.p.o.  Pain/Anxiety/Delirium protocol (if indicated): precedex VAP protocol (if indicated): not indicated DVT prophylaxis: Lovenox GI prophylaxis:  Pepcid Glucose control: Not indicated Mobility: Bedrest Code Status: Full code Family Communication: mother via phone Disposition: ICU  Labs   CBC: Recent Labs  Lab 07/01/20 1025 07/01/20 1119 07/01/20 1817 07/02/20 0233 07/02/20 0316 07/03/20 0319 07/03/20 0436  WBC 15.8*  --   --   --  10.5  --  10.8*  NEUTROABS 3.4  --   --   --  7.2  --  7.3  HGB 16.7   < > 15.3 15.3 14.7 14.3 13.7  HCT 57.8*   < > 45.0 45.0 44.1 42.0 42.2  MCV 99.5  --   --   --  88.2  --  88.5  PLT 259  --   --   --  196  --  163   < > = values in this interval not displayed.    Basic Metabolic Panel: Recent Labs  Lab 07/01/20 1025 07/01/20 1119 07/01/20 1817 07/02/20 0233 07/02/20 0316 07/03/20 0319 07/03/20 0436 07/03/20 1320 07/03/20 1641 07/04/20 0512 07/04/20 1648  NA 144   < > 140 141 140 141 141  --   --   --   --   K 4.5   < > 3.8 3.9 4.0 3.8 4.0  --   --   --   --   CL 103  --   --   --  106  --  106  --   --   --   --   CO2 8*  --   --   --  22  --  24  --   --   --   --   GLUCOSE 218*  --   --   --  104*  --  89  --   --   --   --   BUN 18  --   --   --  13  --  14  --   --   --   --   CREATININE 1.50*  --   --   --  1.21  --  1.14  --   --   --   --   CALCIUM 10.0  --   --   --  8.9  --  8.7*  --   --   --   --   MG 2.3  --   --   --  2.3  --  2.1  --   --   --   --   PHOS  --   --   --   --   --   --   --  3.7 3.5 4.0 3.1   < > = values in this interval not displayed.   GFR: Estimated Creatinine Clearance: 108.7 mL/min (by C-G formula based on SCr of 1.14 mg/dL). Recent Labs  Lab 07/01/20 1025 07/02/20 0316 07/03/20 0436  WBC 15.8* 10.5 10.8*    Liver Function Tests: Recent Labs  Lab 07/01/20 1025 07/03/20 0436  AST 40 23  ALT 19 13  ALKPHOS 43 29*  BILITOT 1.1 0.3  PROT 7.9 6.0*  ALBUMIN 4.9 3.1*  No results for input(s): LIPASE, AMYLASE in the last 168 hours. No results for input(s): AMMONIA in the last 168 hours.  ABG    Component Value Date/Time    PHART 7.493 (H) 07/03/2020 0319   PCO2ART 33.2 07/03/2020 0319   PO2ART 77 (L) 07/03/2020 0319   HCO3 25.5 07/03/2020 0319   TCO2 26 07/03/2020 0319   ACIDBASEDEF 7.0 (H) 07/01/2020 1119   O2SAT 96.0 07/03/2020 0319     Coagulation Profile: Recent Labs  Lab 07/01/20 1025  INR 1.2    Cardiac Enzymes: Recent Labs  Lab 07/02/20 0538  CKTOTAL 886*    HbA1C: Hgb A1c MFr Bld  Date/Time Value Ref Range Status  12/24/2019 05:13 PM 5.8 (H) 4.8 - 5.6 % Final    Comment:    (NOTE) Pre diabetes:          5.7%-6.4% Diabetes:              >6.4% Glycemic control for   <7.0% adults with diabetes     CBG: Recent Labs  Lab 07/05/20 1218 07/05/20 1612 07/05/20 1945 07/05/20 2345 07/06/20 0349  GLUCAP 112* 133* 155* 120* 111*   Critical care time: The patient is critically ill with multiple organ systems failure and requires high complexity decision making for assessment and support, frequent evaluation and titration of therapies, application of advanced monitoring technologies and extensive interpretation of multiple databases.  Critical care time 37 mins. This represents my time independent of the NPs time taking care of the pt. This is excluding procedures.    Briant Sites DO [facility] 07/06/2020, 7:56 AM

## 2020-07-06 NOTE — Progress Notes (Signed)
Pt cxr c/w pna and with fever but inability to obtain sputum 2/2 pt's mental status and no ett. Will start empiric cefepime for 5 days.   Check stat cbc and pct, consider d/c if normal.

## 2020-07-07 DIAGNOSIS — R410 Disorientation, unspecified: Secondary | ICD-10-CM

## 2020-07-07 DIAGNOSIS — J181 Lobar pneumonia, unspecified organism: Secondary | ICD-10-CM

## 2020-07-07 DIAGNOSIS — G40901 Epilepsy, unspecified, not intractable, with status epilepticus: Secondary | ICD-10-CM | POA: Diagnosis not present

## 2020-07-07 LAB — MAGNESIUM: Magnesium: 1.8 mg/dL (ref 1.7–2.4)

## 2020-07-07 LAB — PROCALCITONIN: Procalcitonin: <0.1 ng/mL

## 2020-07-07 LAB — CULTURE, BLOOD (ROUTINE X 2)
Culture: NO GROWTH
Culture: NO GROWTH

## 2020-07-07 LAB — GLUCOSE, CAPILLARY
Glucose-Capillary: 109 mg/dL — ABNORMAL HIGH (ref 70–99)
Glucose-Capillary: 117 mg/dL — ABNORMAL HIGH (ref 70–99)
Glucose-Capillary: 121 mg/dL — ABNORMAL HIGH (ref 70–99)
Glucose-Capillary: 90 mg/dL (ref 70–99)
Glucose-Capillary: 96 mg/dL (ref 70–99)
Glucose-Capillary: 98 mg/dL (ref 70–99)

## 2020-07-07 LAB — COMPREHENSIVE METABOLIC PANEL
ALT: 31 U/L (ref 0–44)
AST: 54 U/L — ABNORMAL HIGH (ref 15–41)
Albumin: 2.9 g/dL — ABNORMAL LOW (ref 3.5–5.0)
Alkaline Phosphatase: 36 U/L — ABNORMAL LOW (ref 38–126)
Anion gap: 9 (ref 5–15)
BUN: 14 mg/dL (ref 6–20)
CO2: 26 mmol/L (ref 22–32)
Calcium: 9 mg/dL (ref 8.9–10.3)
Chloride: 103 mmol/L (ref 98–111)
Creatinine, Ser: 0.98 mg/dL (ref 0.61–1.24)
GFR, Estimated: >60 mL/min (ref >60)
Glucose, Bld: 137 mg/dL — ABNORMAL HIGH (ref 70–99)
Potassium: 3.4 mmol/L — ABNORMAL LOW (ref 3.5–5.1)
Sodium: 138 mmol/L (ref 135–145)
Total Bilirubin: 0.3 mg/dL (ref 0.3–1.2)
Total Protein: 6.2 g/dL — ABNORMAL LOW (ref 6.5–8.1)

## 2020-07-07 LAB — CBC
HCT: 41.2 % (ref 39.0–52.0)
Hemoglobin: 13.5 g/dL (ref 13.0–17.0)
MCH: 28.7 pg (ref 26.0–34.0)
MCHC: 32.8 g/dL (ref 30.0–36.0)
MCV: 87.5 fL (ref 80.0–100.0)
Platelets: 224 10*3/uL (ref 150–400)
RBC: 4.71 MIL/uL (ref 4.22–5.81)
RDW: 12.4 % (ref 11.5–15.5)
WBC: 6.8 10*3/uL (ref 4.0–10.5)
nRBC: 0 % (ref 0.0–0.2)

## 2020-07-07 LAB — PHOSPHORUS: Phosphorus: 4.1 mg/dL (ref 2.5–4.6)

## 2020-07-07 MED ORDER — ESLICARBAZEPINE ACETATE 800 MG PO TABS
1600.0000 mg | ORAL_TABLET | Freq: Every day | ORAL | Status: DC
  Administered 2020-07-08 – 2020-07-11 (×4): 1600 mg via ORAL
  Filled 2020-07-07 (×4): qty 30

## 2020-07-07 MED ORDER — POTASSIUM CHLORIDE 20 MEQ/15ML (10%) PO SOLN
40.0000 meq | Freq: Once | ORAL | Status: AC
  Administered 2020-07-07: 40 meq
  Filled 2020-07-07: qty 30

## 2020-07-07 MED ORDER — VALPROIC ACID 250 MG PO CAPS
1000.0000 mg | ORAL_CAPSULE | Freq: Three times a day (TID) | ORAL | Status: DC
  Administered 2020-07-07 – 2020-07-10 (×12): 1000 mg via ORAL
  Filled 2020-07-07 (×13): qty 4

## 2020-07-07 MED ORDER — MAGNESIUM SULFATE 2 GM/50ML IV SOLN
2.0000 g | Freq: Once | INTRAVENOUS | Status: AC
  Administered 2020-07-07: 2 g via INTRAVENOUS
  Filled 2020-07-07: qty 50

## 2020-07-07 MED ORDER — LORAZEPAM 2 MG/ML IJ SOLN
INTRAMUSCULAR | Status: AC
  Filled 2020-07-07: qty 1

## 2020-07-07 NOTE — Progress Notes (Signed)
Brief Neuro update:  Extubated and no seizures after extubation. Continue VPA 1000mg  TID and Aptiom 1600mg  daily.  - No driving for 6 months atleast. Has to be seizure free for 6 months atleast before he can resume driving. Will need to be cleared by neurology to be able to drive.  - Neurology inpatient team will signoff. Please feel free to contact with any questions or concerns.  Seizure precautions: Per [facility] statutes, patients with seizures are not allowed to drive until they have been seizure-free for six months and cleared by a physician    Use caution when using heavy equipment or power tools. Avoid working on ladders or at heights. Take showers instead of baths. Ensure the water temperature is not too high on the home water heater. Do not go swimming alone. Do not lock yourself in a room alone (i.e. bathroom). When caring for infants or small children, sit down when holding, feeding, or changing them to minimize risk of injury to the child in the event you have a seizure. Maintain good sleep hygiene. Avoid alcohol.    If patient has another seizure, call 911 and bring them back to the ED if: A.  The seizure lasts longer than 5 minutes.      B.  The patient doesn't wake shortly after the seizure or has new problems such as difficulty seeing, speaking or moving following the seizure C.  The patient was injured during the seizure D.  The patient has a temperature over 102 F (39C) E.  The patient vomited during the seizure and now is having trouble breathing    During the Seizure   - First, ensure adequate ventilation and place patients on the floor on their left side  Loosen clothing around the neck and ensure the airway is patent. If the patient is clenching the teeth, do not force the mouth open with any object as this can cause severe damage - Remove all items from the surrounding that can be hazardous. The patient may be oblivious to what's happening and may not even  know what he or she is doing. If the patient is confused and wandering, either gently guide him/her away and block access to outside areas - Reassure the individual and be comforting - Call 911. In most cases, the seizure ends before EMS arrives. However, there are cases when seizures may last over 3 to 5 minutes. Or the individual may have developed breathing difficulties or severe injuries. If a pregnant patient or a person with diabetes develops a seizure, it is prudent to call an ambulance. - Finally, if the patient does not regain full consciousness, then call EMS. Most patients will remain confused for about 45 to 90 minutes after a seizure, so you must use judgment in calling for help. - Avoid restraints but make sure the patient is in a bed with padded side rails - Place the individual in a lateral position with the neck slightly flexed; this will help the saliva drain from the mouth and prevent the tongue from falling backward - Remove all nearby furniture and other hazards from the area - Provide verbal assurance as the individual is regaining consciousness - Provide the patient with privacy if possible - Call for help and start treatment as ordered by the caregiver    After the Seizure (Postictal Stage)   After a seizure, most patients experience confusion, fatigue, muscle pain and/or a headache. Thus, one should permit the individual to sleep. For the  next few days, reassurance is essential. Being calm and helping reorient the person is also of importance.   Most seizures are painless and end spontaneously. Seizures are not harmful to others but can lead to complications such as stress on the lungs, brain and the heart. Individuals with prior lung problems may develop labored breathing and respiratory distress.

## 2020-07-07 NOTE — Plan of Care (Signed)
  Problem: Skin Integrity: Goal: Risk for impaired skin integrity will decrease Outcome: Progressing   

## 2020-07-07 NOTE — Progress Notes (Signed)
Patient pulled out coretrack regardless of being in 5 point restraints. Tube feeding turned off.

## 2020-07-07 NOTE — Progress Notes (Signed)
NAME:  Dylan Moon, MRN:  [id number], DOB:  [birth year], LOS: 6 ADMISSION DATE:  07/01/2020, CONSULTATION DATE:  07/01/2020 REFERRING MD:  ED, CHIEF COMPLAINT:  Persistent seizures   Brief History   This is a 24 year old with a history of seizure disorder and prior history of status who will began seizing this morning.  Apparently had 30 minutes of uninterrupted seizures before EMS arrived.  He was given benzodiazepines in transport but seizures did not resolve.  He was given further benzodiazepines on arrival in the department of emergency medicine however he subsequently became apneic and required intubation.  Despite being loaded with valproic acid and started on propofol he is still intermittently having seizures being treated with boluses of Versed. History of present illness   Have no one present to provide [place] with any history.  Appears that he has had episodes of status in the past related to noncompliance I know nothing of whether he had any concurrent illness fevers chills changes in medications or use of street drugs  Past Medical History  Epilepsy, prior status, anxiety, and ADHD.  Significant Hospital Events   Intubated in ER 10/9  Consults:  Neurology  Procedures:  None  Significant Diagnostic Tests:  CT Head 10/9 >> Negative for intracranial abnormalities   cEEG 10/9 - 10/10 >> Negative for seizures or epileptiform discharges. Severe diffuse encephalopathy.  Micro Data:  10/9 sars: negative 10/10 blood: ngtd Antimicrobials:  Cefepime 10/14 >> 10/14 Zosyn 10/14 >>   Interim history/subjective:  Awake, agitated Wants to talk to his mother, wants to get out of the hospital so he can go to school In restraints Pulled his NG tube this morning  Objective   Blood pressure (!) 150/97, pulse (!) 57, temperature 98.7 F (37.1 C), temperature source Axillary, resp. rate (!) 27, height 6' (1.829 m), weight 89.4 kg, SpO2 99 %.        Intake/Output Summary (Last 24  hours) at 07/07/2020 0823 Last data filed at 07/07/2020 0700 Gross per 24 hour  Intake 3195.82 ml  Output 1525 ml  Net 1670.82 ml   Filed Weights   07/05/20 0500 07/06/20 0500 07/07/20 0500  Weight: 89.2 kg 89.1 kg 89.4 kg   General: Well-developed young man, in four-point restraints on Precedex HENT: Oropharynx clear, NG tube removed, pupils equal PULM: Clear bilaterally, no wheeze, no crackles CV: Regular, distant, borderline tachycardic, no murmur GI: Nondistended, soft, positive bowel sounds MSK: Normal tone no deformities Neuro: Awake, will interact but delirious.  Understands that he is in the hospital but wants to get out so that he can "go to [facility]".  Difficult to reorient.  No tremor, no obvious seizure activity.  Good strength in all extremities  Resolved Hospital Problem list     Assessment & Plan:   # Breakthrough Seizures complicated by Status Epilepticus.  Suspect medical noncompliance # Hx of Seizures #Delirium, agitation.  Question withdrawal from substances, benzos? -Continue Precedex and wean as his agitation, mental status will allow, currently 2.0 -Unfortunately pulled his NG tube.  We will likely have to change his valproic acid to IV temporarily.  He will not be able to get his eslicarbazepine until we reestablish enteral access. -Seroquel and Xanax as ordered -Restraints.  He may benefit from a sitter and coming off restraints if he can calm down some more  # Acute Hypoxic Respiratory Failure secondary to Acute Encephalopathy # Right lower lobe pneumonia, suspect aspiration pneumonia in the setting of his seizures -Weaned to room  air -Cefepime started, changed to Zosyn 10/14 in order to avoid affecting the seizure threshold  Best practice:  Diet: N.p.o.  Pain/Anxiety/Delirium protocol (if indicated): precedex VAP protocol (if indicated): not indicated DVT prophylaxis: Lovenox GI prophylaxis: Pepcid Glucose control: Not indicated Mobility:  Bedrest Code Status: Full code Family Communication:will contact mother  Disposition: ICU  Labs   CBC: Recent Labs  Lab 07/01/20 1025 07/01/20 1119 07/02/20 0316 07/03/20 0319 07/03/20 0436 07/06/20 1157 07/07/20 0317  WBC 15.8*  --  10.5  --  10.8* 7.9 6.8  NEUTROABS 3.4  --  7.2  --  7.3 5.2  --   HGB 16.7   < > 14.7 14.3 13.7 13.8 13.5  HCT 57.8*   < > 44.1 42.0 42.2 42.3 41.2  MCV 99.5  --  88.2  --  88.5 87.4 87.5  PLT 259  --  196  --  163 218 224   < > = values in this interval not displayed.    Basic Metabolic Panel: Recent Labs  Lab 07/01/20 1025 07/01/20 1119 07/02/20 0233 07/02/20 0316 07/03/20 0319 07/03/20 0436 07/03/20 1320 07/03/20 1641 07/04/20 0512 07/04/20 1648 07/07/20 0317  NA 144   < > 141 140 141 141  --   --   --   --  138  K 4.5   < > 3.9 4.0 3.8 4.0  --   --   --   --  3.4*  CL 103  --   --  106  --  106  --   --   --   --  103  CO2 8*  --   --  22  --  24  --   --   --   --  26  GLUCOSE 218*  --   --  104*  --  89  --   --   --   --  137*  BUN 18  --   --  13  --  14  --   --   --   --  14  CREATININE 1.50*  --   --  1.21  --  1.14  --   --   --   --  0.98  CALCIUM 10.0  --   --  8.9  --  8.7*  --   --   --   --  9.0  MG 2.3  --   --  2.3  --  2.1  --   --   --   --  1.8  PHOS  --   --   --   --   --   --  3.7 3.5 4.0 3.1 4.1   < > = values in this interval not displayed.   GFR: Estimated Creatinine Clearance: 126.5 mL/min (by C-G formula based on SCr of 0.98 mg/dL). Recent Labs  Lab 07/02/20 0316 07/03/20 0436 07/06/20 1157 07/07/20 0317  PROCALCITON  --   --  0.12 <0.10  WBC 10.5 10.8* 7.9 6.8    Liver Function Tests: Recent Labs  Lab 07/01/20 1025 07/03/20 0436 07/07/20 0317  AST 40 23 54*  ALT 19 13 31   ALKPHOS 43 29* 36*  BILITOT 1.1 0.3 0.3  PROT 7.9 6.0* 6.2*  ALBUMIN 4.9 3.1* 2.9*   No results for input(s): LIPASE, AMYLASE in the last 168 hours. No results for input(s): AMMONIA in the last 168  hours.  ABG    Component Value Date/Time   PHART  7.493 (H) 07/03/2020 0319   PCO2ART 33.2 07/03/2020 0319   PO2ART 77 (L) 07/03/2020 0319   HCO3 25.5 07/03/2020 0319   TCO2 26 07/03/2020 0319   ACIDBASEDEF 7.0 (H) 07/01/2020 1119   O2SAT 96.0 07/03/2020 0319     Coagulation Profile: Recent Labs  Lab 07/01/20 1025  INR 1.2    Cardiac Enzymes: Recent Labs  Lab 07/02/20 0538  CKTOTAL 886*    HbA1C: Hgb A1c MFr Bld  Date/Time Value Ref Range Status  12/24/2019 05:13 PM 5.8 (H) 4.8 - 5.6 % Final    Comment:    (NOTE) Pre diabetes:          5.7%-6.4% Diabetes:              >6.4% Glycemic control for   <7.0% adults with diabetes     CBG: Recent Labs  Lab 07/06/20 1601 07/06/20 1934 07/06/20 2320 07/07/20 0328 07/07/20 0803  GLUCAP 122* 108* 101* 117* 98    Critical care time: The patient is critically ill with multiple organ systems failure and requires high complexity decision making for assessment and support, frequent evaluation and titration of therapies, application of advanced monitoring technologies and extensive interpretation of multiple databases.  Critical care time 34 mins. This represents my time independent of the NPs time taking care of the pt. This is excluding procedures.     Levy Pupa, MD, PhD 07/07/2020, 8:33 AM [facility] [phone] or if no answer [phone]

## 2020-07-08 ENCOUNTER — Inpatient Hospital Stay (HOSPITAL_COMMUNITY): Payer: Medicaid Other

## 2020-07-08 DIAGNOSIS — R410 Disorientation, unspecified: Secondary | ICD-10-CM | POA: Diagnosis not present

## 2020-07-08 DIAGNOSIS — G40901 Epilepsy, unspecified, not intractable, with status epilepticus: Secondary | ICD-10-CM | POA: Diagnosis not present

## 2020-07-08 LAB — GLUCOSE, CAPILLARY
Glucose-Capillary: 104 mg/dL — ABNORMAL HIGH (ref 70–99)
Glucose-Capillary: 93 mg/dL (ref 70–99)
Glucose-Capillary: 102 mg/dL — ABNORMAL HIGH (ref 70–99)

## 2020-07-08 LAB — CBC
HCT: 41.7 % (ref 39.0–52.0)
Hemoglobin: 13.7 g/dL (ref 13.0–17.0)
MCH: 28.2 pg (ref 26.0–34.0)
MCHC: 32.9 g/dL (ref 30.0–36.0)
MCV: 86 fL (ref 80.0–100.0)
Platelets: 282 10*3/uL (ref 150–400)
RBC: 4.85 MIL/uL (ref 4.22–5.81)
RDW: 12.2 % (ref 11.5–15.5)
WBC: 5.7 10*3/uL (ref 4.0–10.5)
nRBC: 0 % (ref 0.0–0.2)

## 2020-07-08 LAB — BASIC METABOLIC PANEL
Anion gap: 9 (ref 5–15)
BUN: 17 mg/dL (ref 6–20)
CO2: 23 mmol/L (ref 22–32)
Calcium: 9.2 mg/dL (ref 8.9–10.3)
Chloride: 104 mmol/L (ref 98–111)
Creatinine, Ser: 0.89 mg/dL (ref 0.61–1.24)
GFR, Estimated: >60 mL/min (ref >60)
Glucose, Bld: 133 mg/dL — ABNORMAL HIGH (ref 70–99)
Potassium: 4.2 mmol/L (ref 3.5–5.1)
Sodium: 136 mmol/L (ref 135–145)

## 2020-07-08 LAB — MAGNESIUM: Magnesium: 2.1 mg/dL (ref 1.7–2.4)

## 2020-07-08 LAB — PROCALCITONIN: Procalcitonin: <0.1 ng/mL

## 2020-07-08 LAB — PHOSPHORUS: Phosphorus: 4.2 mg/dL (ref 2.5–4.6)

## 2020-07-08 MED ORDER — LORAZEPAM 1 MG PO TABS
2.0000 mg | ORAL_TABLET | Freq: Four times a day (QID) | ORAL | Status: DC
  Administered 2020-07-08 – 2020-07-10 (×8): 2 mg via ORAL
  Filled 2020-07-08 (×9): qty 2

## 2020-07-08 MED ORDER — ALPRAZOLAM 0.5 MG PO TABS
2.0000 mg | ORAL_TABLET | Freq: Three times a day (TID) | ORAL | Status: DC | PRN
  Administered 2020-07-08 – 2020-07-11 (×8): 2 mg via ORAL
  Filled 2020-07-08 (×8): qty 4

## 2020-07-08 NOTE — Progress Notes (Signed)
NAME:  [name], MRN:  [id number], DOB:  27-Aug-1996, LOS: 7 ADMISSION DATE:  07/01/2020, CONSULTATION DATE:  07/01/2020 REFERRING MD:  ED, CHIEF COMPLAINT:  Persistent seizures   Brief History   This is a 24 year old with a history of seizure disorder and prior history of status who will began seizing this morning.  Apparently had 30 minutes of uninterrupted seizures before EMS arrived.  He was given benzodiazepines in transport but seizures did not resolve.  He was given further benzodiazepines on arrival in the department of emergency medicine however he subsequently became apneic and required intubation.  Despite being loaded with valproic acid and started on propofol he is still intermittently having seizures being treated with boluses of Versed. History of present illness   Have no one present to provide [place] with any history.  Appears that he has had episodes of status in the past related to noncompliance I know nothing of whether he had any concurrent illness fevers chills changes in medications or use of street drugs  Past Medical History  Epilepsy, prior status, anxiety, and ADHD.  Significant Hospital Events   Intubated in ER 10/9  Consults:  Neurology  Procedures:  None  Significant Diagnostic Tests:  CT Head 10/9 >> Negative for intracranial abnormalities   cEEG 10/9 - 10/10 >> Negative for seizures or epileptiform discharges. Severe diffuse encephalopathy.  Micro Data:  10/9 sars: negative 10/10 blood: ngtd Antimicrobials:  Cefepime 10/14 >> 10/14 Zosyn 10/14 >>   Interim history/subjective:   Still restrained, still agitated.  A bit sleepy currently.  Precedex is 2.0  Objective   Blood pressure 125/77, pulse (!) 50, temperature 98 F (36.7 C), temperature source Axillary, resp. rate (!) 27, height 6' (1.829 m), weight 87.8 kg, SpO2 100 %.        Intake/Output Summary (Last 24 hours) at 07/08/2020 0954 Last data filed at 07/08/2020 0900 Gross per 24 hour   Intake 1782.28 ml  Output 2050 ml  Net -267.72 ml   Filed Weights   07/06/20 0500 07/07/20 0500 07/08/20 0500  Weight: 89.1 kg 89.4 kg 87.8 kg   General: Well-developed man, four-point restraints, Precedex running HENT: Oropharynx clear, managing his secretions.  Pupils equal PULM: Clear bilaterally, no crackles, no wheezes CV: Regular, distant, borderline tachycardic, no murmur GI: Soft, nondistended, bowel sounds present MSK: Normal tone Neuro: He wakes to voice, speaks but a bit garbled with some dysarthria.  He is oriented to self, place but not situation.  He wants to leave, wants to speak to his mother.  Will intermittently lash out.  Difficult to reorient.  No tremor or obvious seizure activity.  Very strong in all extremities   Resolved Hospital Problem list     Assessment & Plan:   # Breakthrough Seizures complicated by Status Epilepticus.  Suspect medical noncompliance # Hx of Seizures #Delirium, agitation.  Question withdrawal from substances, benzos? -Continue Precedex, attempt to wean as his mental status will allow.  Very difficult case as there appears to be psychiatric overtone as well. -Tolerating p.o. so we were able to leave his eslicarbazepine and valproic acid as pills -Xanax, BuSpar, Lexapro, Seroquel as ordered -Start scheduled enteral Ativan on 10/16 -Restraints.  Hopefully will calm down enough to allow [place] to remove, transition to a sitter  # Acute Hypoxic Respiratory Failure secondary to Acute Encephalopathy # Right lower lobe pneumonia, suspect aspiration pneumonia in the setting of his seizures -Continue pulmonary hygiene -Repeat chest x-ray today reviewed, persistent right lower  lobe infiltrate -Continue Zosyn for now, consider transition to p.o. Augmentin next few days   Best practice:  Diet: Start diet 10/16 Pain/Anxiety/Delirium protocol (if indicated): precedex, scheduled Ativan, Xanax VAP protocol (if indicated): not indicated DVT  prophylaxis: Lovenox GI prophylaxis: Pepcid Glucose control: Not indicated Mobility: Bedrest Code Status: Full code Family Communication: spoke with mother by phone 10/16 Disposition: ICU  Labs   CBC: Recent Labs  Lab 07/01/20 1025 07/01/20 1119 07/02/20 0316 07/02/20 0316 07/03/20 0319 07/03/20 0436 07/06/20 1157 07/07/20 0317 07/08/20 0517  WBC 15.8*   < > 10.5  --   --  10.8* 7.9 6.8 5.7  NEUTROABS 3.4  --  7.2  --   --  7.3 5.2  --   --   HGB 16.7   < > 14.7   < > 14.3 13.7 13.8 13.5 13.7  HCT 57.8*   < > 44.1   < > 42.0 42.2 42.3 41.2 41.7  MCV 99.5   < > 88.2  --   --  88.5 87.4 87.5 86.0  PLT 259   < > 196  --   --  163 218 224 282   < > = values in this interval not displayed.    Basic Metabolic Panel: Recent Labs  Lab 07/01/20 1025 07/01/20 1119 07/02/20 0316 07/03/20 0319 07/03/20 0436 07/03/20 1320 07/03/20 1641 07/04/20 0512 07/04/20 1648 07/07/20 0317 07/08/20 0517  NA 144   < > 140 141 141  --   --   --   --  138 136  K 4.5   < > 4.0 3.8 4.0  --   --   --   --  3.4* 4.2  CL 103  --  106  --  106  --   --   --   --  103 104  CO2 8*  --  22  --  24  --   --   --   --  26 23  GLUCOSE 218*  --  104*  --  89  --   --   --   --  137* 133*  BUN 18  --  13  --  14  --   --   --   --  14 17  CREATININE 1.50*  --  1.21  --  1.14  --   --   --   --  0.98 0.89  CALCIUM 10.0  --  8.9  --  8.7*  --   --   --   --  9.0 9.2  MG 2.3  --  2.3  --  2.1  --   --   --   --  1.8 2.1  PHOS  --   --   --   --   --    < > 3.5 4.0 3.1 4.1 4.2   < > = values in this interval not displayed.   GFR: Estimated Creatinine Clearance: 139.3 mL/min (by C-G formula based on SCr of 0.89 mg/dL). Recent Labs  Lab 07/03/20 0436 07/06/20 1157 07/07/20 0317 07/08/20 0517  PROCALCITON  --  0.12 <0.10 <0.10  WBC 10.8* 7.9 6.8 5.7    Liver Function Tests: Recent Labs  Lab 07/01/20 1025 07/03/20 0436 07/07/20 0317  AST 40 23 54*  ALT 19 13 31   ALKPHOS 43 29* 36*  BILITOT  1.1 0.3 0.3  PROT 7.9 6.0* 6.2*  ALBUMIN 4.9 3.1* 2.9*   No results for input(s): LIPASE,  AMYLASE in the last 168 hours. No results for input(s): AMMONIA in the last 168 hours.  ABG    Component Value Date/Time   PHART 7.493 (H) 07/03/2020 0319   PCO2ART 33.2 07/03/2020 0319   PO2ART 77 (L) 07/03/2020 0319   HCO3 25.5 07/03/2020 0319   TCO2 26 07/03/2020 0319   ACIDBASEDEF 7.0 (H) 07/01/2020 1119   O2SAT 96.0 07/03/2020 0319     Coagulation Profile: Recent Labs  Lab 07/01/20 1025  INR 1.2    Cardiac Enzymes: Recent Labs  Lab 07/02/20 0538  CKTOTAL 886*    HbA1C: Hgb A1c MFr Bld  Date/Time Value Ref Range Status  12/24/2019 05:13 PM 5.8 (H) 4.8 - 5.6 % Final    Comment:    (NOTE) Pre diabetes:          5.7%-6.4% Diabetes:              >6.4% Glycemic control for   <7.0% adults with diabetes     CBG: Recent Labs  Lab 07/07/20 1534 07/07/20 1952 07/07/20 2346 07/08/20 0337 07/08/20 0749  GLUCAP 96 109* 90 104* 93    Critical care time: The patient is critically ill with multiple organ systems failure and requires high complexity decision making for assessment and support, frequent evaluation and titration of therapies, application of advanced monitoring technologies and extensive interpretation of multiple databases.  Critical care time 32 mins. This represents my time independent of the NPs time taking care of the pt. This is excluding procedures.     Levy Pupa, MD, PhD 07/08/2020, 9:54 AM [facility] [phone] or if no answer [phone]

## 2020-07-08 NOTE — Plan of Care (Signed)
  Problem: Clinical Measurements: Goal: Ability to maintain clinical measurements within normal limits will improve Outcome: Progressing   

## 2020-07-09 DIAGNOSIS — R410 Disorientation, unspecified: Secondary | ICD-10-CM | POA: Diagnosis not present

## 2020-07-09 DIAGNOSIS — G40901 Epilepsy, unspecified, not intractable, with status epilepticus: Secondary | ICD-10-CM | POA: Diagnosis not present

## 2020-07-09 MED ORDER — AMOXICILLIN-POT CLAVULANATE 875-125 MG PO TABS
1.0000 | ORAL_TABLET | Freq: Two times a day (BID) | ORAL | Status: DC
  Administered 2020-07-09 – 2020-07-11 (×5): 1 via ORAL
  Filled 2020-07-09 (×6): qty 1

## 2020-07-09 MED ORDER — QUETIAPINE FUMARATE 100 MG PO TABS
100.0000 mg | ORAL_TABLET | Freq: Two times a day (BID) | ORAL | Status: DC
  Administered 2020-07-09 – 2020-07-10 (×3): 100 mg via ORAL
  Filled 2020-07-09 (×3): qty 1

## 2020-07-09 NOTE — Plan of Care (Signed)
  Problem: Activity: Goal: Risk for activity intolerance will decrease Outcome: Progressing   

## 2020-07-09 NOTE — Progress Notes (Signed)
NAME:  Dylan Moon, MRN:  [id number], DOB:  [birth year], LOS: 8 ADMISSION DATE:  07/01/2020, CONSULTATION DATE:  07/01/2020 REFERRING MD:  ED, CHIEF COMPLAINT:  Persistent seizures   Brief History   This is a 24 year old with a history of seizure disorder and prior history of status who will began seizing this morning.  Apparently had 30 minutes of uninterrupted seizures before EMS arrived.  He was given benzodiazepines in transport but seizures did not resolve.  He was given further benzodiazepines on arrival in the department of emergency medicine however he subsequently became apneic and required intubation.  Despite being loaded with valproic acid and started on propofol he is still intermittently having seizures being treated with boluses of Versed. History of present illness   Have no one present to provide [place] with any history.  Appears that he has had episodes of status in the past related to noncompliance I know nothing of whether he had any concurrent illness fevers chills changes in medications or use of street drugs  Past Medical History  Epilepsy, prior status, anxiety, and ADHD.  Significant Hospital Events   Intubated in ER 10/9  Consults:  Neurology  Procedures:  None  Significant Diagnostic Tests:  CT Head 10/9 >> Negative for intracranial abnormalities   cEEG 10/9 - 10/10 >> Negative for seizures or epileptiform discharges. Severe diffuse encephalopathy.  Micro Data:  10/9 sars: negative 10/10 blood: ngtd Antimicrobials:  Cefepime 10/14 >> 10/14 Zosyn 10/14 >> 10/17 Augmentin 10/17 >>   Interim history/subjective:   Better oriented but still has periods of agitation per RN.  Sleepy currently Precedex 2.0 I/0 +5.6 L total   Objective   Blood pressure 123/76, pulse 60, temperature 98.1 F (36.7 C), temperature source Oral, resp. rate (!) 22, height 6' (1.829 m), weight 81.5 kg, SpO2 100 %.        Intake/Output Summary (Last 24 hours) at 07/09/2020  0831 Last data filed at 07/09/2020 0700 Gross per 24 hour  Intake 1079.73 ml  Output 1825 ml  Net -745.27 ml   Filed Weights   07/07/20 0500 07/08/20 0500 07/09/20 0500  Weight: 89.4 kg 87.8 kg 81.5 kg   General: Well-developed, strong, four-point restraints, Precedex on HENT: Oropharynx clear, no difficulty with secretions, pupils equal PULM: Clear bilaterally CV: Regular, bradycardic, no murmur GI: Nondistended, positive bowel sounds MSK: Normal tone Neuro: Will wake to voice, oriented x3 (improvement), does try to rise up out of the bed but easily reoriented.   Resolved Hospital Problem list     Assessment & Plan:   # Breakthrough Seizures complicated by Status Epilepticus.  Suspect medical noncompliance # Hx of Seizures #Delirium, agitation.  Question withdrawal from substances, benzos? -Plan to continue Precedex, continue to attempt to wean as his mental status will allow.  Very difficult case as there appears to be psychiatric overtone as well. -Tolerating p.o. so we were able to leave his eslicarbazepine and valproic acid as pills -Scheduled Ativan started 10/16 -Xanax to as needed, BuSpar, Lexapro.  Plan to uptitrate Seroquel 1017 -Continue restraints.  Goal is that he will will calm down enough to allow [place] to remove, transition to a sitter -Psych to eval when he is able to participate.  He has left AMA in the past whenever he gets to that point  # Acute Hypoxic Respiratory Failure secondary to Acute Encephalopathy # Right lower lobe pneumonia, suspect aspiration pneumonia in the setting of his seizures -Pulmonary hygiene -Change Zosyn to Augmentin on 10/17,  plan for 7 days (through 10/20)   Best practice:  Diet: Start diet 10/16 Pain/Anxiety/Delirium protocol (if indicated): precedex, scheduled Ativan, Xanax, seroquel VAP protocol (if indicated): not indicated DVT prophylaxis: Lovenox GI prophylaxis: Pepcid Glucose control: Not indicated Mobility:  Bedrest Code Status: Full code Family Communication: spoke with mother by phone 10/16 Disposition: ICU  Labs   CBC: Recent Labs  Lab 07/03/20 0319 07/03/20 0436 07/06/20 1157 07/07/20 0317 07/08/20 0517  WBC  --  10.8* 7.9 6.8 5.7  NEUTROABS  --  7.3 5.2  --   --   HGB 14.3 13.7 13.8 13.5 13.7  HCT 42.0 42.2 42.3 41.2 41.7  MCV  --  88.5 87.4 87.5 86.0  PLT  --  163 218 224 282    Basic Metabolic Panel: Recent Labs  Lab 07/03/20 0319 07/03/20 0436 07/03/20 1320 07/03/20 1641 07/04/20 0512 07/04/20 1648 07/07/20 0317 07/08/20 0517  NA 141 141  --   --   --   --  138 136  K 3.8 4.0  --   --   --   --  3.4* 4.2  CL  --  106  --   --   --   --  103 104  CO2  --  24  --   --   --   --  26 23  GLUCOSE  --  89  --   --   --   --  137* 133*  BUN  --  14  --   --   --   --  14 17  CREATININE  --  1.14  --   --   --   --  0.98 0.89  CALCIUM  --  8.7*  --   --   --   --  9.0 9.2  MG  --  2.1  --   --   --   --  1.8 2.1  PHOS  --   --    < > 3.5 4.0 3.1 4.1 4.2   < > = values in this interval not displayed.   GFR: Estimated Creatinine Clearance: 139.3 mL/min (by C-G formula based on SCr of 0.89 mg/dL). Recent Labs  Lab 07/03/20 0436 07/06/20 1157 07/07/20 0317 07/08/20 0517  PROCALCITON  --  0.12 <0.10 <0.10  WBC 10.8* 7.9 6.8 5.7    Liver Function Tests: Recent Labs  Lab 07/03/20 0436 07/07/20 0317  AST 23 54*  ALT 13 31  ALKPHOS 29* 36*  BILITOT 0.3 0.3  PROT 6.0* 6.2*  ALBUMIN 3.1* 2.9*   No results for input(s): LIPASE, AMYLASE in the last 168 hours. No results for input(s): AMMONIA in the last 168 hours.  ABG    Component Value Date/Time   PHART 7.493 (H) 07/03/2020 0319   PCO2ART 33.2 07/03/2020 0319   PO2ART 77 (L) 07/03/2020 0319   HCO3 25.5 07/03/2020 0319   TCO2 26 07/03/2020 0319   ACIDBASEDEF 7.0 (H) 07/01/2020 1119   O2SAT 96.0 07/03/2020 0319     Coagulation Profile: No results for input(s): INR, PROTIME in the last 168  hours.  Cardiac Enzymes: No results for input(s): CKTOTAL, CKMB, CKMBINDEX, TROPONINI in the last 168 hours.  HbA1C: Hgb A1c MFr Bld  Date/Time Value Ref Range Status  12/24/2019 05:13 PM 5.8 (H) 4.8 - 5.6 % Final    Comment:    (NOTE) Pre diabetes:          5.7%-6.4% Diabetes:              >  6.4% Glycemic control for   <7.0% adults with diabetes     CBG: Recent Labs  Lab 07/07/20 1952 07/07/20 2346 07/08/20 0337 07/08/20 0749 07/08/20 1135  GLUCAP 109* 90 104* 93 102*    Critical care time: The patient is critically ill with multiple organ systems failure and requires high complexity decision making for assessment and support, frequent evaluation and titration of therapies, application of advanced monitoring technologies and extensive interpretation of multiple databases.  Critical care time 31 mins. This represents my time independent of the NPs time taking care of the pt. This is excluding procedures.     Levy Pupa, MD, PhD 07/09/2020, 8:31 AM [facility] [phone] or if no answer [phone]

## 2020-07-10 DIAGNOSIS — F4325 Adjustment disorder with mixed disturbance of emotions and conduct: Secondary | ICD-10-CM

## 2020-07-10 DIAGNOSIS — G40901 Epilepsy, unspecified, not intractable, with status epilepticus: Secondary | ICD-10-CM | POA: Diagnosis not present

## 2020-07-10 LAB — AMMONIA: Ammonia: 60 umol/L — ABNORMAL HIGH (ref 9–35)

## 2020-07-10 LAB — FOLATE: Folate: 9.3 ng/mL (ref >5.9)

## 2020-07-10 LAB — TSH: TSH: 2.226 u[IU]/mL (ref 0.350–4.500)

## 2020-07-10 LAB — VITAMIN B12: Vitamin B-12: 799 pg/mL (ref 180–914)

## 2020-07-10 MED ORDER — HALOPERIDOL LACTATE 5 MG/ML IJ SOLN
5.0000 mg | Freq: Once | INTRAMUSCULAR | Status: AC
  Administered 2020-07-10: 5 mg via INTRAMUSCULAR

## 2020-07-10 MED ORDER — LACTULOSE 10 GM/15ML PO SOLN
30.0000 g | Freq: Two times a day (BID) | ORAL | Status: DC
  Administered 2020-07-10 – 2020-07-11 (×2): 30 g via ORAL
  Filled 2020-07-10 (×2): qty 45

## 2020-07-10 MED ORDER — OLANZAPINE 10 MG PO TABS
10.0000 mg | ORAL_TABLET | Freq: Two times a day (BID) | ORAL | Status: DC
  Administered 2020-07-10 – 2020-07-11 (×2): 10 mg via ORAL
  Filled 2020-07-10 (×3): qty 1

## 2020-07-10 MED ORDER — HALOPERIDOL LACTATE 5 MG/ML IJ SOLN
5.0000 mg | Freq: Four times a day (QID) | INTRAMUSCULAR | Status: DC | PRN
  Administered 2020-07-10 – 2020-07-13 (×5): 5 mg via INTRAMUSCULAR
  Filled 2020-07-10 (×7): qty 1

## 2020-07-10 NOTE — Progress Notes (Signed)
NAME:  Dylan Moon, MRN:  [id number], DOB:  Dec 02, 1995, LOS: 9 ADMISSION DATE:  07/01/2020, CONSULTATION DATE:  10/9 REFERRING MD:  ED, CHIEF COMPLAINT:  Seizure   Brief History   24 y/o male with history of seizures admitted on 10/9 in the setting of over 30 minutes of seizure activity.  Extubated 10/13.  Past Medical History  Epilepsy Anxiety ADHD  Significant Hospital Events   Intubated in ER 10/9 Extubated 10/13  Consults:  Neuro  Procedures:    Significant Diagnostic Tests:  CT Head 10/9 >> Negative for intracranial abnormalities   cEEG 10/9 - 10/10 >> Negative for seizures or epileptiform discharges. Severe diffuse encephalopathy.  Micro Data:  10/9 sars: negative 10/10 blood: ngtd  Antimicrobials:  Cefepime 10/14 >> 10/14 Zosyn 10/14 >>   Interim history/subjective:  Severe agitation Remains on precedex receiving high dose sedatives  Objective   Blood pressure 133/90, pulse (!) 48, temperature 97.6 F (36.4 C), temperature source Axillary, resp. rate (!) 21, height 6' (1.829 m), weight 87.6 kg, SpO2 100 %.        Intake/Output Summary (Last 24 hours) at 07/10/2020 9518 Last data filed at 07/10/2020 0400 Gross per 24 hour  Intake 738.51 ml  Output 2525 ml  Net -1786.49 ml   Filed Weights   07/08/20 0500 07/09/20 0500 07/10/20 0500  Weight: 87.8 kg 81.5 kg 87.6 kg    Examination:  General:  Resting comfortably in bed HENT: NCAT OP clear, mucus membranes dry PULM: CTA B, normal effort CV: RRR, no mgr GI: BS+, soft, nontender MSK: normal bulk and tone Neuro: awake, yelling, doesn't follow commands, agitated, kicking at bed   Resolved Hospital Problem list     Assessment & Plan:  Status epilepticus > no further seizures Acute metabolic encephalopathy > severe agitation, combativeness, unclear etiology, remains a risk to himself Baseline depression and anxiety, family says that he has frequent threatening behavior at home, very  impatient Withdrawal syndrome? Doubt based on what mother tells me > check valproate acid level, ammonia level, tsh, b12, folate > neuro consult, asked them to come back to see him, AED related? > haldol prn > precedex > increase frequency > esitalopram, buspar, seroquel > continue soft behavioral restraints > prn xanax > if no clear diagnosis after neuro/psyche evaluation will intubate and get MRI and LP  Acute respiratory failure due to inability to protect airway due to acute metabolic encephalopathy> improved > monitor for aspiraiton precautions  Right lower lobe pneumonia > five days of antibiotics   Best practice:  Diet: regular Pain/Anxiety/Delirium protocol (if indicated): n/a VAP protocol (if indicated): n/a DVT prophylaxis: lovenox GI prophylaxis: famotidine Glucose control: SSI Mobility: bed rest Code Status: full Family Communication: updated mother and daughters by phone today Disposition:   Labs   CBC: Recent Labs  Lab 07/06/20 1157 07/07/20 0317 07/08/20 0517  WBC 7.9 6.8 5.7  NEUTROABS 5.2  --   --   HGB 13.8 13.5 13.7  HCT 42.3 41.2 41.7  MCV 87.4 87.5 86.0  PLT 218 224 282    Basic Metabolic Panel: Recent Labs  Lab 07/03/20 1641 07/04/20 0512 07/04/20 1648 07/07/20 0317 07/08/20 0517  NA  --   --   --  138 136  K  --   --   --  3.4* 4.2  CL  --   --   --  103 104  CO2  --   --   --  26 23  GLUCOSE  --   --   --  137* 133*  BUN  --   --   --  14 17  CREATININE  --   --   --  0.98 0.89  CALCIUM  --   --   --  9.0 9.2  MG  --   --   --  1.8 2.1  PHOS 3.5 4.0 3.1 4.1 4.2   GFR: Estimated Creatinine Clearance: 139.3 mL/min (by C-G formula based on SCr of 0.89 mg/dL). Recent Labs  Lab 07/06/20 1157 07/07/20 0317 07/08/20 0517  PROCALCITON 0.12 <0.10 <0.10  WBC 7.9 6.8 5.7    Liver Function Tests: Recent Labs  Lab 07/07/20 0317  AST 54*  ALT 31  ALKPHOS 36*  BILITOT 0.3  PROT 6.2*  ALBUMIN 2.9*   No results for input(s):  LIPASE, AMYLASE in the last 168 hours. No results for input(s): AMMONIA in the last 168 hours.  ABG    Component Value Date/Time   PHART 7.493 (H) 07/03/2020 0319   PCO2ART 33.2 07/03/2020 0319   PO2ART 77 (L) 07/03/2020 0319   HCO3 25.5 07/03/2020 0319   TCO2 26 07/03/2020 0319   ACIDBASEDEF 7.0 (H) 07/01/2020 1119   O2SAT 96.0 07/03/2020 0319     Coagulation Profile: No results for input(s): INR, PROTIME in the last 168 hours.  Cardiac Enzymes: No results for input(s): CKTOTAL, CKMB, CKMBINDEX, TROPONINI in the last 168 hours.  HbA1C: Hgb A1c MFr Bld  Date/Time Value Ref Range Status  12/24/2019 05:13 PM 5.8 (H) 4.8 - 5.6 % Final    Comment:    (NOTE) Pre diabetes:          5.7%-6.4% Diabetes:              >6.4% Glycemic control for   <7.0% adults with diabetes     CBG: Recent Labs  Lab 07/07/20 1952 07/07/20 2346 07/08/20 0337 07/08/20 0749 07/08/20 1135  GLUCAP 109* 90 104* 93 102*     Critical care time: 35 minutes     Dylan [place], MD  PCCM Pager: [phone] Cell: [phone] If no response, call [phone]

## 2020-07-10 NOTE — Progress Notes (Signed)
[facility]  Slept better part of the day Seen by psychiatry, diagnosed with adjustment disorder with mixed disturbance of emotions and conduct, seroquel changed to Zyprexa, lexapro, buspar and xanax remain  Serum ammonia elevated> is this due to valproate acid?  Neurology to see, question for them is should we change off valproate to another agent  Lactulose ordered  Heber Crane, MD [facility] Pager: [phone] Cell: [phone] If no response, call [phone]

## 2020-07-10 NOTE — Consult Note (Signed)
[facility] Face-to-Face Psychiatry Consult   Reason for Consult:  Delirium Referring Physician:  Dr Kendrick Fries Patient Identification: Dylan Moon MRN:  [id number] Principal Diagnosis: Seizures Diagnosis:  Active Problems:   Adjustment disorder with mixed disturbance of emotions and conduct   Status epilepticus (HCC)   Total Time spent with patient: 45 minutes  Subjective:   Dylan Moon is a 24 y.o. male patient admitted with seizure d/o.  HPI:  Patient seen and evaluated in person by this provider.  Dylan Moon is in restraints and continues to be agitated, wants to leave.  Slurred speech and difficult to understand at times.  Focused on leaving the hospital at this time. Remains agitated despite many medications.  Past Psychiatric History: ADHD  Risk to Self:  none Risk to Others:  none Prior Inpatient Therapy:  none Prior Outpatient Therapy:  none  Past Medical History:  Past Medical History:  Diagnosis Date  . ADHD   . Scoliosis   . Seizures (HCC)    most recent 12/02/17    Past Surgical History:  Procedure Laterality Date  . NO PAST SURGERIES     Family History:  Family History  Problem Relation Age of Onset  . Diabetes Mother   . Hypertension Mother   . Cancer Other   . Diabetes Father   . Seizures Maternal Grandfather    Family Psychiatric  History: none Social History:  Social History   Substance and Sexual Activity  Alcohol Use No     Social History   Substance and Sexual Activity  Drug Use Yes  . Types: Marijuana   Comment: Daily use of marijuana.    Social History   Socioeconomic History  . Marital status: Single    Spouse name: Not on file  . Number of children: 0  . Years of education: HS  . Highest education level: Not on file  Occupational History  . Occupation: Arboriculturist  Tobacco Use  . Smoking status: Never Smoker  . Smokeless tobacco: Never Used  Vaping Use  . Vaping Use: Never used  Substance and Sexual Activity   . Alcohol use: No  . Drug use: Yes    Types: Marijuana    Comment: Daily use of marijuana.  . Sexual activity: Not Currently  Other Topics Concern  . Not on file  Social History Narrative   Lives at home his mother.   3-4 sodas per week.   Right-handed.   Social Determinants of Health   Financial Resource Strain:   . Difficulty of Paying Living Expenses: Not on file  Food Insecurity:   . Worried About Programme researcher, broadcasting/film/video in the Last Year: Not on file  . Ran Out of Food in the Last Year: Not on file  Transportation Needs:   . Lack of Transportation (Medical): Not on file  . Lack of Transportation (Non-Medical): Not on file  Physical Activity:   . Days of Exercise per Week: Not on file  . Minutes of Exercise per Session: Not on file  Stress:   . Feeling of Stress : Not on file  Social Connections:   . Frequency of Communication with Friends and Family: Not on file  . Frequency of Social Gatherings with Friends and Family: Not on file  . Attends Religious Services: Not on file  . Active Member of Clubs or Organizations: Not on file  . Attends Banker Meetings: Not on file  . Marital Status: Not on file   Additional  Social History:    Allergies:   Allergies  Allergen Reactions  . Keppra [Levetiracetam] Other (See Comments)    Irritability   . Tramadol Other (See Comments)    Contraindicated with current medications (??)  . Vimpat [Lacosamide] Other (See Comments)    Causes anger    Labs:  Results for orders placed or performed during the hospital encounter of 07/01/20 (from the past 48 hour(s))  Ammonia     Status: Abnormal   Collection Time: 07/10/20 11:55 AM  Result Value Ref Range   Ammonia 60 (H) 9 - 35 umol/L    Comment: Performed at [facility], [address] [place], [place] [address]  TSH     Status: None   Collection Time: 07/10/20 11:55 AM  Result Value Ref Range   TSH 2.226 0.350 - 4.500 uIU/mL    Comment: Performed by a 3rd  Generation assay with a functional sensitivity of <=0.01 uIU/mL. Performed at [facility], [address] [place], [place] [address]   Vitamin B12     Status: None   Collection Time: 07/10/20 11:55 AM  Result Value Ref Range   Vitamin B-12 799 180 - 914 pg/mL    Comment: (NOTE) This assay is not validated for testing neonatal or myeloproliferative syndrome specimens for Vitamin B12 levels. Performed at [facility], [address], [place], [place] [address]   Folate, serum, performed at [facility] lab     Status: None   Collection Time: 07/10/20 11:55 AM  Result Value Ref Range   Folate 9.3 >5.9 ng/mL    Comment: Performed at [facility], [address] [place], [place] [address]    Current Facility-Administered Medications  Medication Dose Route Frequency Provider Last Rate Last Admin  . acetaminophen (TYLENOL) 160 MG/5ML solution 650 mg  650 mg Per Tube Q6H PRN Lanier Clam, NP   650 mg at 07/06/20 0401  . ALPRAZolam Prudy Feeler) tablet 2 mg  2 mg Oral TID PRN Leslye Peer, MD   2 mg at 07/10/20 1024  . amoxicillin-clavulanate (AUGMENTIN) 875-125 MG per tablet 1 tablet  1 tablet Oral Q12H Leslye Peer, MD   1 tablet at 07/10/20 0935  . busPIRone (BUSPAR) tablet 7.5 mg  7.5 mg Oral BID Briant Sites, DO   7.5 mg at 07/10/20 0933  . chlorhexidine (PERIDEX) 0.12 % solution 15 mL  15 mL Mouth Rinse BID Migdalia Dk, MD   15 mL at 07/10/20 0936  . Chlorhexidine Gluconate Cloth 2 % PADS 6 each  6 each Topical Daily Lynnell Jude, MD   6 each at 07/10/20 1005  . dexmedetomidine (PRECEDEX) 400 MCG/100ML (4 mcg/mL) infusion  0.4-2 mcg/kg/hr Intravenous Titrated Briant Sites, DO 35.5 mL/hr at 07/10/20 1615 1.6 mcg/kg/hr at 07/10/20 1615  . docusate (COLACE) 50 MG/5ML liquid 100 mg  100 mg Per Tube BID PRN Lynnell Jude, MD      . enoxaparin (LOVENOX) injection 40 mg  40 mg Subcutaneous Q24H Lynnell Jude, MD   40 mg at 07/09/20 1717  . escitalopram  (LEXAPRO) tablet 5 mg  5 mg Oral QHS Briant Sites, DO   5 mg at 07/09/20 2104  . Eslicarbazepine Acetate TABS 1,600 mg  1,600 mg Oral Daily Leslye Peer, MD   1,600 mg at 07/10/20 1105  . famotidine (PEPCID) tablet 20 mg  20 mg Oral BID Lynnell Jude, MD   20 mg at  07/10/20 0934  . feeding supplement (VITAL 1.5 CAL) liquid 1,000 mL  1,000 mL Per Tube Continuous Briant SitesMarshall, Jessica, DO   Stopped at 07/07/20 0650  . fentaNYL (SUBLIMAZE) injection 25-100 mcg  25-100 mcg Intravenous Q2H PRN Migdalia Dkgan, Okoronkwo U, MD   50 mcg at 07/09/20 0119  . haloperidol lactate (HALDOL) injection 5 mg  5 mg Intramuscular Q6H PRN Lupita LeashMcQuaid, Douglas B, MD   5 mg at 07/10/20 0937  . lactated ringers infusion   Intravenous Continuous Leslye PeerByrum, Robert S, MD 10 mL/hr at 07/10/20 1000 Rate Verify at 07/10/20 1000  . LORazepam (ATIVAN) tablet 2 mg  2 mg Oral Q6H Leslye PeerByrum, Robert S, MD   2 mg at 07/10/20 0516  . MEDLINE mouth rinse  15 mL Mouth Rinse q12n4p Migdalia Dkgan, Okoronkwo U, MD   15 mL at 07/10/20 1211  . polyethylene glycol (MIRALAX / GLYCOLAX) packet 17 g  17 g Oral Daily PRN Lynnell JudeGray, Walter J, MD   17 g at 07/09/20 1100  . QUEtiapine (SEROQUEL) tablet 100 mg  100 mg Oral BID Leslye PeerByrum, Robert S, MD   100 mg at 07/10/20 0933  . valproic acid (DEPAKENE) 250 MG capsule 1,000 mg  1,000 mg Oral TID Caryl PinaLindzen, Eric, MD   1,000 mg at 07/10/20 [id number]    Musculoskeletal: Strength & Muscle Tone: increased Gait & Station: did not witness Patient leans: N/A  Psychiatric Specialty Exam: Physical Exam Vitals and nursing note reviewed.  HENT:     Head: Normocephalic.     Nose: Nose normal.  Pulmonary:     Effort: Pulmonary effort is normal.  Musculoskeletal:     Cervical back: Normal range of motion.  Neurological:     General: No focal deficit present.     Mental Status: Dylan Moon is alert.  Psychiatric:        Attention and Perception: Dylan Moon is inattentive.        Mood and Affect: Mood is anxious.        Speech: Speech is slurred.         Behavior: Behavior is agitated.        Thought Content: Thought content normal.        Cognition and Memory: Cognition normal.        Judgment: Judgment is impulsive.     Review of Systems  Psychiatric/Behavioral: Positive for agitation and behavioral problems. The patient is nervous/anxious.   All other systems reviewed and are negative.   Blood pressure 106/66, pulse (!) 52, temperature 97.6 F (36.4 C), temperature source Oral, resp. rate 18, height 6' (1.829 m), weight 87.6 kg, SpO2 100 %.Body mass index is 26.19 kg/m.  General Appearance: Disheveled  Eye Contact:  Fair  Speech:  Slurred  Volume:  Normal  Mood:  Anxious and Irritable  Affect:  Congruent  Thought Process:  Coherent  Orientation:  Other:  person and place  Thought Content:  Obsessions  Suicidal Thoughts:  No  Homicidal Thoughts:  No  Memory:  Immediate;   Fair Recent;   Fair Remote;   Fair  Judgement:  Poor  Insight:  Lacking  Psychomotor Activity:  Increased  Concentration:  Concentration: Fair and Attention Span: Fair  Recall:  [employer]Fair  Fund of Knowledge:  Fair  Language:  Fair  Akathisia:  No  Handed:  Right  AIMS (if indicated):     Assets:  Housing Leisure Time Resilience Social Support  ADL's:  Intact  Cognition:  WNL  Sleep:  Treatment Plan Summary: Adjustment disorder with mixed disturbance of emotions and conduct: -Discontinue Seroquel 100 mg BID  -Start Zyprexa 10 mg BID  Anxiety: -Continue Lexapro 5 mg daily -Continue Buspar 7.5 mg BID -Continue Xanax 2 mg TID PRN  Disposition: No evidence of imminent risk to self or others at present.    Nanine Means, NP 07/10/2020 4:56 PM

## 2020-07-11 ENCOUNTER — Inpatient Hospital Stay (HOSPITAL_COMMUNITY): Payer: Medicaid Other

## 2020-07-11 DIAGNOSIS — R569 Unspecified convulsions: Secondary | ICD-10-CM | POA: Diagnosis not present

## 2020-07-11 DIAGNOSIS — G40901 Epilepsy, unspecified, not intractable, with status epilepticus: Secondary | ICD-10-CM | POA: Diagnosis not present

## 2020-07-11 DIAGNOSIS — F4325 Adjustment disorder with mixed disturbance of emotions and conduct: Secondary | ICD-10-CM | POA: Diagnosis not present

## 2020-07-11 LAB — BLOOD GAS, ARTERIAL
Acid-base deficit: 1.1 mmol/L (ref 0.0–2.0)
Bicarbonate: 22.8 mmol/L (ref 20.0–28.0)
FIO2: 40
O2 Saturation: 96.1 %
Patient temperature: 37
pCO2 arterial: 36.4 mmHg (ref 32.0–48.0)
pH, Arterial: 7.414 (ref 7.350–7.450)
pO2, Arterial: 87.3 mmHg (ref 83.0–108.0)

## 2020-07-11 LAB — CBC WITH DIFFERENTIAL/PLATELET
Abs Immature Granulocytes: 0.09 10*3/uL — ABNORMAL HIGH (ref 0.00–0.07)
Basophils Absolute: 0 10*3/uL (ref 0.0–0.1)
Basophils Relative: 1 %
Eosinophils Absolute: 0.1 10*3/uL (ref 0.0–0.5)
Eosinophils Relative: 1 %
HCT: 46.3 % (ref 39.0–52.0)
Hemoglobin: 15.5 g/dL (ref 13.0–17.0)
Immature Granulocytes: 2 %
Lymphocytes Relative: 43 %
Lymphs Abs: 2.3 10*3/uL (ref 0.7–4.0)
MCH: 28.8 pg (ref 26.0–34.0)
MCHC: 33.5 g/dL (ref 30.0–36.0)
MCV: 86.1 fL (ref 80.0–100.0)
Monocytes Absolute: 0.6 10*3/uL (ref 0.1–1.0)
Monocytes Relative: 11 %
Neutro Abs: 2.2 10*3/uL (ref 1.7–7.7)
Neutrophils Relative %: 42 %
Platelets: 379 10*3/uL (ref 150–400)
RBC: 5.38 MIL/uL (ref 4.22–5.81)
RDW: 12.2 % (ref 11.5–15.5)
WBC: 5.2 10*3/uL (ref 4.0–10.5)
nRBC: 0 % (ref 0.0–0.2)

## 2020-07-11 LAB — COMPREHENSIVE METABOLIC PANEL
ALT: 25 U/L (ref 0–44)
AST: 36 U/L (ref 15–41)
Albumin: 3.2 g/dL — ABNORMAL LOW (ref 3.5–5.0)
Alkaline Phosphatase: 38 U/L (ref 38–126)
Anion gap: 9 (ref 5–15)
BUN: 19 mg/dL (ref 6–20)
CO2: 23 mmol/L (ref 22–32)
Calcium: 9.2 mg/dL (ref 8.9–10.3)
Chloride: 105 mmol/L (ref 98–111)
Creatinine, Ser: 1.14 mg/dL (ref 0.61–1.24)
GFR, Estimated: >60 mL/min (ref >60)
Glucose, Bld: 101 mg/dL — ABNORMAL HIGH (ref 70–99)
Potassium: 4.8 mmol/L (ref 3.5–5.1)
Sodium: 137 mmol/L (ref 135–145)
Total Bilirubin: 0.5 mg/dL (ref 0.3–1.2)
Total Protein: 6.4 g/dL — ABNORMAL LOW (ref 6.5–8.1)

## 2020-07-11 LAB — CSF CELL COUNT WITH DIFFERENTIAL
RBC Count, CSF: 0 /mm3
RBC Count, CSF: 0 /mm3
Tube #: 1
Tube #: 4
WBC, CSF: 1 /mm3 (ref 0–5)
WBC, CSF: 2 /mm3 (ref 0–5)

## 2020-07-11 LAB — PHOSPHORUS: Phosphorus: 4.7 mg/dL — ABNORMAL HIGH (ref 2.5–4.6)

## 2020-07-11 LAB — VALPROIC ACID LEVEL: Valproic Acid Lvl: 106 ug/mL — ABNORMAL HIGH (ref 50.0–100.0)

## 2020-07-11 LAB — CRYPTOCOCCAL ANTIGEN, CSF: Crypto Ag: NEGATIVE

## 2020-07-11 LAB — MAGNESIUM: Magnesium: 2.1 mg/dL (ref 1.7–2.4)

## 2020-07-11 LAB — GLUCOSE, CAPILLARY: Glucose-Capillary: 98 mg/dL (ref 70–99)

## 2020-07-11 LAB — PROTEIN AND GLUCOSE, CSF
Glucose, CSF: 57 mg/dL (ref 40–70)
Total  Protein, CSF: 25 mg/dL (ref 15–45)

## 2020-07-11 MED ORDER — POLYETHYLENE GLYCOL 3350 17 G PO PACK: 17.0000 g | PACK | Freq: Every day | ORAL | Status: DC

## 2020-07-11 MED ORDER — OLANZAPINE 10 MG PO TABS
10.0000 mg | ORAL_TABLET | Freq: Two times a day (BID) | ORAL | Status: DC
  Administered 2020-07-11 – 2020-07-13 (×3): 10 mg
  Filled 2020-07-11 (×5): qty 1

## 2020-07-11 MED ORDER — DIVALPROEX SODIUM 500 MG PO DR TAB
1250.0000 mg | DELAYED_RELEASE_TABLET | Freq: Two times a day (BID) | ORAL | Status: DC
  Administered 2020-07-11 (×2): 1250 mg via ORAL
  Filled 2020-07-11 (×3): qty 1

## 2020-07-11 MED ORDER — DOCUSATE SODIUM 50 MG/5ML PO LIQD: 100.0000 mg | Freq: Two times a day (BID) | ORAL | Status: DC

## 2020-07-11 MED ORDER — FENTANYL CITRATE (PF) 100 MCG/2ML IJ SOLN
50.0000 ug | INTRAMUSCULAR | Status: DC | PRN
  Administered 2020-07-11: 200 ug via INTRAVENOUS
  Filled 2020-07-11: qty 4

## 2020-07-11 MED ORDER — PHENYLEPHRINE HCL-NACL 10-0.9 MG/250ML-% IV SOLN
25.0000 ug/min | INTRAVENOUS | Status: DC
  Administered 2020-07-11: 25 ug/min via INTRAVENOUS
  Filled 2020-07-11: qty 250

## 2020-07-11 MED ORDER — MIDAZOLAM HCL 2 MG/2ML IJ SOLN
INTRAMUSCULAR | Status: AC
  Filled 2020-07-11: qty 4

## 2020-07-11 MED ORDER — DOCUSATE SODIUM 50 MG/5ML PO LIQD
100.0000 mg | Freq: Two times a day (BID) | ORAL | Status: DC
  Administered 2020-07-11: 100 mg
  Filled 2020-07-11: qty 10

## 2020-07-11 MED ORDER — FENTANYL CITRATE (PF) 100 MCG/2ML IJ SOLN
INTRAMUSCULAR | Status: AC
  Filled 2020-07-11: qty 2

## 2020-07-11 MED ORDER — ROCURONIUM BROMIDE 10 MG/ML (PF) SYRINGE
PREFILLED_SYRINGE | INTRAVENOUS | Status: AC
  Filled 2020-07-11: qty 10

## 2020-07-11 MED ORDER — MIDAZOLAM HCL 2 MG/2ML IJ SOLN
2.0000 mg | INTRAMUSCULAR | Status: DC | PRN
  Filled 2020-07-11: qty 2

## 2020-07-11 MED ORDER — LACTULOSE 10 GM/15ML PO SOLN
30.0000 g | Freq: Two times a day (BID) | ORAL | Status: DC
  Administered 2020-07-11 – 2020-07-13 (×4): 30 g
  Filled 2020-07-11 (×4): qty 45

## 2020-07-11 MED ORDER — ETOMIDATE 2 MG/ML IV SOLN
INTRAVENOUS | Status: AC
  Filled 2020-07-11: qty 20

## 2020-07-11 MED ORDER — ROCURONIUM BROMIDE 50 MG/5ML IV SOLN
100.0000 mg | Freq: Once | INTRAVENOUS | Status: AC
  Administered 2020-07-11: 100 mg via INTRAVENOUS
  Filled 2020-07-11: qty 10

## 2020-07-11 MED ORDER — PROSOURCE TF PO LIQD
45.0000 mL | Freq: Two times a day (BID) | ORAL | Status: DC
  Administered 2020-07-11 – 2020-07-12 (×2): 45 mL
  Filled 2020-07-11 (×2): qty 45

## 2020-07-11 MED ORDER — ESLICARBAZEPINE ACETATE 800 MG PO TABS
1600.0000 mg | ORAL_TABLET | Freq: Every day | ORAL | Status: DC
  Filled 2020-07-11: qty 30

## 2020-07-11 MED ORDER — ETOMIDATE 2 MG/ML IV SOLN
20.0000 mg | Freq: Once | INTRAVENOUS | Status: AC
  Administered 2020-07-11: 20 mg via INTRAVENOUS

## 2020-07-11 MED ORDER — SODIUM CHLORIDE 0.9 % IV SOLN: 250.0000 mL | INTRAVENOUS | Status: DC

## 2020-07-11 MED ORDER — CHLORHEXIDINE GLUCONATE 0.12% ORAL RINSE (MEDLINE KIT)
15.0000 mL | Freq: Two times a day (BID) | OROMUCOSAL | Status: DC
  Administered 2020-07-11 – 2020-07-12 (×2): 15 mL via OROMUCOSAL

## 2020-07-11 MED ORDER — ESCITALOPRAM OXALATE 10 MG PO TABS
5.0000 mg | ORAL_TABLET | Freq: Every day | ORAL | Status: DC
  Administered 2020-07-11: 5 mg
  Filled 2020-07-11: qty 1

## 2020-07-11 MED ORDER — LACTATED RINGERS IV BOLUS
1000.0000 mL | Freq: Once | INTRAVENOUS | Status: AC
  Administered 2020-07-11: 1000 mL via INTRAVENOUS

## 2020-07-11 MED ORDER — AMOXICILLIN-POT CLAVULANATE 875-125 MG PO TABS
1.0000 | ORAL_TABLET | Freq: Two times a day (BID) | ORAL | Status: AC
  Administered 2020-07-11 – 2020-07-12 (×2): 1
  Filled 2020-07-11 (×3): qty 1

## 2020-07-11 MED ORDER — PROPOFOL 1000 MG/100ML IV EMUL
0.0000 ug/kg/min | INTRAVENOUS | Status: DC
  Administered 2020-07-11 (×2): 30 ug/kg/min via INTRAVENOUS
  Administered 2020-07-12 (×2): 25 ug/kg/min via INTRAVENOUS
  Filled 2020-07-11 (×4): qty 100

## 2020-07-11 MED ORDER — ORAL CARE MOUTH RINSE
15.0000 mL | OROMUCOSAL | Status: DC
  Administered 2020-07-11 – 2020-07-12 (×7): 15 mL via OROMUCOSAL

## 2020-07-11 MED ORDER — MIDAZOLAM HCL 2 MG/2ML IJ SOLN: 2.0000 mg | INTRAMUSCULAR | Status: DC | PRN

## 2020-07-11 MED ORDER — FAMOTIDINE 20 MG PO TABS
20.0000 mg | ORAL_TABLET | Freq: Two times a day (BID) | ORAL | Status: DC
  Administered 2020-07-11 – 2020-07-13 (×3): 20 mg
  Filled 2020-07-11 (×3): qty 1

## 2020-07-11 MED ORDER — VITAL HIGH PROTEIN PO LIQD
1000.0000 mL | ORAL | Status: DC
  Administered 2020-07-12: 1000 mL

## 2020-07-11 MED ORDER — FENTANYL CITRATE (PF) 100 MCG/2ML IJ SOLN: 50.0000 ug | INTRAMUSCULAR | Status: DC | PRN

## 2020-07-11 MED ORDER — ENSURE ENLIVE PO LIQD: 237.0000 mL | Freq: Three times a day (TID) | ORAL | Status: DC

## 2020-07-11 MED ORDER — BUSPIRONE HCL 15 MG PO TABS
7.5000 mg | ORAL_TABLET | Freq: Two times a day (BID) | ORAL | Status: DC
  Administered 2020-07-11 – 2020-07-13 (×3): 7.5 mg
  Filled 2020-07-11 (×3): qty 1

## 2020-07-11 NOTE — Procedures (Signed)
Lumbar Puncture Procedure Note  HOLGER SOKOLOWSKI  [id number]  1995/10/25  Date:07/11/20  Time:4:15 PM   Provider Performing:Brent Jessalyn Hinojosa   Procedure: Lumbar Puncture (46270)  Indication(s) Rule out meningitis  Consent Risks of the procedure as well as the alternatives and risks of each were explained to the patient and/or caregiver.  Consent for the procedure was obtained and is signed in the bedside chart  Anesthesia Topical only with 1% lidocaine    Time Out Verified patient identification, verified procedure, site/side was marked, verified correct patient position, special equipment/implants available, medications/allergies/relevant history reviewed, required imaging and test results available.   Sterile Technique Maximal sterile technique including sterile barrier drape, hand hygiene, sterile gown, sterile gloves, mask, hair covering.    Procedure Description Using palpation, approximate location of L3-L4 space identified.   Lidocaine used to anesthetize skin and subcutaneous tissue overlying this area.  A 20g spinal needle was then used to access the subarachnoid space. Opening pressure:16cm H2O. Closing pressure:Not obtained. 22 cc CSF obtained.  Complications/Tolerance None; patient tolerated the procedure well.   EBL Minimal   Specimen(s) CSF > sent for cell count tubes #1,4; culture, HSV pcr, VDRL, cytology, crypto antigen  Heber [place], MD [place] [facility] Pager: [phone] Cell: [phone] If no response, call [phone]

## 2020-07-11 NOTE — Progress Notes (Signed)
NAME:  Dylan Moon, MRN:  [id number], DOB:  02/03/1996, LOS: 10 ADMISSION DATE:  07/01/2020, CONSULTATION DATE:  10/9 REFERRING MD:  ED, CHIEF COMPLAINT:  Seizure   Brief History   24 y/o male with history of seizures admitted on 10/9 in the setting of over 30 minutes of seizure activity.  Extubated 10/13.  Past Medical History  Epilepsy Anxiety ADHD  Significant Hospital Events   Intubated in ER 10/9 Extubated 10/13  Consults:  Neuro  Procedures:    Significant Diagnostic Tests:  CT Head 10/9 >> Negative for intracranial abnormalities   cEEG 10/9 - 10/10 >> Negative for seizures or epileptiform discharges. Severe diffuse encephalopathy.  10/18 valproate level elevated and ammonia elevated  Micro Data:  10/9 sars: negative 10/10 blood: ngtd  Antimicrobials:  Cefepime 10/14 >> 10/14 Zosyn 10/14 >>   Interim history/subjective:   Placed on precedex 2.0 mcg/min overnight Took lactulose Able to focus on me this morning  Objective   Blood pressure 119/80, pulse (!) 55, temperature 97.7 F (36.5 C), temperature source Axillary, resp. rate 17, height 6' (1.829 m), weight 87.6 kg, SpO2 100 %.        Intake/Output Summary (Last 24 hours) at 07/11/2020 0726 Last data filed at 07/11/2020 0700 Gross per 24 hour  Intake 1017.21 ml  Output 3600 ml  Net -2582.79 ml   Filed Weights   07/09/20 0500 07/10/20 0500 07/11/20 0403  Weight: 81.5 kg 87.6 kg 87.6 kg    Examination:  General:  Resting comfortably in bed HENT: NCAT OP clear PULM: CTA B, normal effort CV: RRR, no mgr GI: BS+, soft, nontender MSK: normal bulk and tone Neuro: drowsy, will open eyes and focus on me, give an answer to my question, doesn't follow commands   Resolved Hospital Problem list     Assessment & Plan:  Status epilepticus > no further seizures Acute metabolic encephalopathy > now chemically restrained on precedex; concern for hyperammonemia from valproate, but picture  uncertain; family says that Vimpat and Keppra and dilantin "made him rage" 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 Do not suspect withdrawal, family says he doesn't drink, only recently prescribed Xanax and only occasionally uses marijuana > hold valproate until seen by epileptology > lactulose today > haldol prn > continue precedex > escitalopram, buspar, seroquel > continue soft behavioral restraints > prn xanax > may still need MRI/LP but would like for neurology to see him prior to this  Acute respiratory failure due to inability to protect airway due to acute metabolic encephalopathy> improved > monitor for aspiration precautions  Right lower lobe pneumonia > five days of antibiotics  Need to advance diet today  Best practice:  Diet: regular Pain/Anxiety/Delirium protocol (if indicated): n/a VAP protocol (if indicated): n/a DVT prophylaxis: lovenox GI prophylaxis: famotidine Glucose control: SSI Mobility: bed rest Code Status: full Family Communication: updated his mother by phone Disposition:   Labs   CBC: Recent Labs  Lab 07/06/20 1157 07/07/20 0317 07/08/20 0517 07/11/20 0431  WBC 7.9 6.8 5.7 5.2  NEUTROABS 5.2  --   --  2.2  HGB 13.8 13.5 13.7 15.5  HCT 42.3 41.2 41.7 46.3  MCV 87.4 87.5 86.0 86.1  PLT 218 224 282 379    Basic Metabolic Panel: Recent Labs  Lab 07/04/20 1648 07/07/20 0317 07/08/20 0517 07/11/20 0431  NA  --  138 136 137  K  --  3.4* 4.2 4.8  CL  --  103 104 105  CO2  --  26 23 23   GLUCOSE  --  137* 133* 101*  BUN  --  14 17 19   CREATININE  --  0.98 0.89 1.14  CALCIUM  --  9.0 9.2 9.2  MG  --  1.8 2.1  --   PHOS 3.1 4.1 4.2  --    GFR: Estimated Creatinine Clearance: 108.7 mL/min (by C-G formula based on SCr of 1.14 mg/dL). Recent Labs  Lab 07/06/20 1157 07/07/20 0317 07/08/20 0517 07/11/20 0431  PROCALCITON 0.12 <0.10 <0.10  --   WBC 7.9 6.8 5.7 5.2    Liver Function Tests: Recent Labs  Lab  07/07/20 0317 07/11/20 0431  AST 54* 36  ALT 31 25  ALKPHOS 36* 38  BILITOT 0.3 0.5  PROT 6.2* 6.4*  ALBUMIN 2.9* 3.2*   No results for input(s): LIPASE, AMYLASE in the last 168 hours. Recent Labs  Lab 07/10/20 1155  AMMONIA 60*    ABG    Component Value Date/Time   PHART 7.493 (H) 07/03/2020 0319   PCO2ART 33.2 07/03/2020 0319   PO2ART 77 (L) 07/03/2020 0319   HCO3 25.5 07/03/2020 0319   TCO2 26 07/03/2020 0319   ACIDBASEDEF 7.0 (H) 07/01/2020 1119   O2SAT 96.0 07/03/2020 0319     Coagulation Profile: No results for input(s): INR, PROTIME in the last 168 hours.  Cardiac Enzymes: No results for input(s): CKTOTAL, CKMB, CKMBINDEX, TROPONINI in the last 168 hours.  HbA1C: Hgb A1c MFr Bld  Date/Time Value Ref Range Status  12/24/2019 05:13 PM 5.8 (H) 4.8 - 5.6 % Final    Comment:    (NOTE) Pre diabetes:          5.7%-6.4% Diabetes:              >6.4% Glycemic control for   <7.0% adults with diabetes     CBG: Recent Labs  Lab 07/07/20 1952 07/07/20 2346 07/08/20 0337 07/08/20 0749 07/08/20 1135  GLUCAP 109* 90 104* 93 102*     Critical care time: 35 minutes     07/10/20, MD [place] [facility] Pager: [phone] Cell: [phone] If no response, call [phone]

## 2020-07-11 NOTE — Procedures (Signed)
Intubation Procedure Note Dylan Moon [id number] August 23, 1996  Procedure: Intubation Indications: Airway protection and maintenance  Procedure Details Consent: Risks of procedure as well as the alternatives and risks of each were explained to the (patient/caregiver).  Consent for procedure obtained. Time Out: Verified patient identification, verified procedure, site/side was marked, verified correct patient position, special equipment/implants available, medications/allergies/relevent history reviewed, required imaging and test results available.  Performed  Drugs Etomidate 20mg , Rocuronium 100mg  IV DL x 1 with GS3 blade Grade 3 view 8.0 ET tube passed through cords under direct visualization Placement confirmed with bilateral breath sounds, positive EtCO2 change and smoke in tube   Evaluation Hemodynamic Status: BP stable throughout; O2 sats: stable throughout Patient's Current Condition: stable Complications: No apparent complications Patient did tolerate procedure well. Chest X-ray ordered to verify placement.  CXR: pending.   07/11/2020

## 2020-07-11 NOTE — Progress Notes (Signed)
[facility]  Ongoing behavioral disturbances requiring chemical and physical restraint Asked mom to come to bedside and we discussed his case extensively.  He has a long history of various behavioral problems, though she says that it has been a complicated picture as there have been concerns that medications contribute to his behavior.  She says that this current state of confusion and agitation is the worst she has seen.  She says that being in health care environments make his agitation and behavior worse.  Based on this I asked her if she thinks he would do better at home now rather than proceed with further work up.  She says that this behavior is worse than she has ever seen so she request a more thorough work up.  This will involve intubation, mechanical ventilation and a lumbar puncture.  Heber [place], MD [place] [facility] Pager: [phone] Cell: [phone] If no response, call [phone]

## 2020-07-11 NOTE — Progress Notes (Signed)
Subjective: Patient has been very agitated overnight, now on Precedex.   ROS: unable to obtain due to poor mental status  Examination  Vital signs in last 24 hours: Temp:  [97.6 F (36.4 C)-97.8 F (36.6 C)] 97.7 F (36.5 C) (10/19 0403) Pulse Rate:  [48-60] 55 (10/19 0403) Resp:  [16-26] 22 (10/19 1200) BP: (78-138)/(55-104) 115/74 (10/19 1200) SpO2:  [100 %] 100 % (10/19 0403) Weight:  [87.6 kg] 87.6 kg (10/19 0403)  General: lying in bed, not in apparent distress CVS: pulse-normal rate and rhythm RS: breathing comfortably, CTA B Extremities: normal, warm Neuro: Sedated on Precedex, does not open eyes to noxious stimuli:, Pupils pinpoint and difficult appreciate any reactivity, corneal reflex intact, no apparent cranial nerve deficits.  For seizure, patient was very agitated and stay exclusive all year therefore did not give any more noxious stimuli to agitate patient but appears that patient was moving all extremities earlier.  Basic Metabolic Panel: Recent Labs  Lab 07/04/20 1648 07/07/20 0317 07/08/20 0517 07/11/20 0431  NA  --  138 136 137  K  --  3.4* 4.2 4.8  CL  --  103 104 105  CO2  --  26 23 23   GLUCOSE  --  137* 133* 101*  BUN  --  14 17 19   CREATININE  --  0.98 0.89 1.14  CALCIUM  --  9.0 9.2 9.2  MG  --  1.8 2.1  --   PHOS 3.1 4.1 4.2  --     CBC: Recent Labs  Lab 07/06/20 1157 07/07/20 0317 07/08/20 0517 07/11/20 0431  WBC 7.9 6.8 5.7 5.2  NEUTROABS 5.2  --   --  2.2  HGB 13.8 13.5 13.7 15.5  HCT 42.3 41.2 41.7 46.3  MCV 87.4 87.5 86.0 86.1  PLT 218 224 282 379     Coagulation Studies: No results for input(s): LABPROT, INR in the last 72 hours.  Imaging CT head without contrast 07/01/2020: No acute abnormality.  ASSESSMENT AND PLAN: 24 year old male with history of epilepsy who presented with breakthrough seizures.  Epilepsy with breakthrough seizures Acute encephalopathy, toxic, metabolic -Patient is encephalopathic.  Ammonia elevated  at 60.  Valproic acid elevated at 106  Recommendations -Due to elevated ammonia levels and close elevated bicarbonate levels, will reduce valproic acid to 1000 mg twice daily (was on 1000 mg 3 times daily) -Will check valproic acid level again tomorrow -Continue management of hyperammonemia with lactulose -Continue eslicarbazepine at current dose -Continue seizure precautions - As needed IV Ativan 2 mg clinical seizures  -Management of rest of the comorbidities per primary team -Will also call patient's mother to give her an update  I have spent a total of  35 minutes with the patient reviewing hospital notes,  test results, labs and examining the patient as well as establishing an assessment and plan that was discussed personally with the patient's team.  > 50% of time was spent in direct patient care.    08/31/2020 Epilepsy [facility] For questions after 5pm please refer to AMION to reach the Neurologist on call

## 2020-07-11 NOTE — Progress Notes (Signed)
Nutrition Follow-up  DOCUMENTATION CODES:   Not applicable  INTERVENTION:   Ensure Enlive po TID, each supplement provides 350 kcal and 20 grams of protein  D/C Vital 1.5 TF order as no enteral access  Recommend considering suppository and/or enema if pt continues without BM  NUTRITION DIAGNOSIS:   Inadequate oral intake related to inability to eat as evidenced by NPO status.  Being addressed via diet advancement and supplements  GOAL:   Patient will meet greater than or equal to 90% of their needs  Progressing   MONITOR:   PO intake, Supplement acceptance, Weight trends  REASON FOR ASSESSMENT:   Ventilator, Consult Enteral/tube feeding initiation and management  ASSESSMENT:   Pt admitted with breakthrough seizures complicated by status epilepticus. PMH includes seizure disorder, ADHD, and scoliosis.  10/09 Intubated 10/11 Cortrak  10/13 Extubated 10/15 Cortrak removed 10/16 Diet advanced to Regular  Pt on precedex; 1:1 sitter  No recorded po intake. Spoke with RN who indicates pt is eating a little, although not a substantial amount  Abdomen distended, no BM since admission. Pt started on lactulose on 10/18  Current wt 87.6 kg; weight relatively stable  Labs: reviewed Meds: lactulose BID   Diet Order:   Diet Order            Diet regular Room service appropriate? Yes with Assist; Fluid consistency: Thin  Diet effective now                 EDUCATION NEEDS:   Not appropriate for education at this time  Skin:  Skin Assessment: Reviewed RN Assessment  Last BM:  no BM since admission (10 days)  Height:   Ht Readings from Last 1 Encounters:  07/01/20 6' (1.829 m)    Weight:   Wt Readings from Last 1 Encounters:  07/11/20 87.6 kg    BMI:  Body mass index is 26.19 kg/m.  Estimated Nutritional Needs:   Kcal:  2200-2400  Protein:  110-120 grams  Fluid:  >/= 2.2 L/day   Romelle Starcher MS, RDN, LDN, CNSC Registered Dietitian  III Clinical Nutrition RD Pager and On-Call Pager Number Located in [place]

## 2020-07-11 NOTE — Progress Notes (Signed)
Mother of pt called wanting updates.... Password provided  Update given and answered questions/concerns... Mother verb understanding.

## 2020-07-12 ENCOUNTER — Inpatient Hospital Stay (HOSPITAL_COMMUNITY): Payer: Medicaid Other

## 2020-07-12 DIAGNOSIS — F4325 Adjustment disorder with mixed disturbance of emotions and conduct: Secondary | ICD-10-CM | POA: Diagnosis not present

## 2020-07-12 DIAGNOSIS — G40901 Epilepsy, unspecified, not intractable, with status epilepticus: Secondary | ICD-10-CM | POA: Diagnosis not present

## 2020-07-12 LAB — VDRL, CSF: VDRL Quant, CSF: NONREACTIVE

## 2020-07-12 LAB — COMPREHENSIVE METABOLIC PANEL
ALT: 31 U/L (ref 0–44)
AST: 38 U/L (ref 15–41)
Albumin: 3.2 g/dL — ABNORMAL LOW (ref 3.5–5.0)
Alkaline Phosphatase: 39 U/L (ref 38–126)
Anion gap: 13 (ref 5–15)
BUN: 23 mg/dL — ABNORMAL HIGH (ref 6–20)
CO2: 21 mmol/L — ABNORMAL LOW (ref 22–32)
Calcium: 9.3 mg/dL (ref 8.9–10.3)
Chloride: 104 mmol/L (ref 98–111)
Creatinine, Ser: 1.14 mg/dL (ref 0.61–1.24)
GFR, Estimated: >60 mL/min (ref >60)
Glucose, Bld: 87 mg/dL (ref 70–99)
Potassium: 4.7 mmol/L (ref 3.5–5.1)
Sodium: 138 mmol/L (ref 135–145)
Total Bilirubin: 0.3 mg/dL (ref 0.3–1.2)
Total Protein: 6.9 g/dL (ref 6.5–8.1)

## 2020-07-12 LAB — CBC WITH DIFFERENTIAL/PLATELET
Abs Immature Granulocytes: 0.15 10*3/uL — ABNORMAL HIGH (ref 0.00–0.07)
Basophils Absolute: 0 10*3/uL (ref 0.0–0.1)
Basophils Relative: 1 %
Eosinophils Absolute: 0.1 10*3/uL (ref 0.0–0.5)
Eosinophils Relative: 1 %
HCT: 42.5 % (ref 39.0–52.0)
Hemoglobin: 14.2 g/dL (ref 13.0–17.0)
Immature Granulocytes: 2 %
Lymphocytes Relative: 26 %
Lymphs Abs: 2.2 10*3/uL (ref 0.7–4.0)
MCH: 29.6 pg (ref 26.0–34.0)
MCHC: 33.4 g/dL (ref 30.0–36.0)
MCV: 88.5 fL (ref 80.0–100.0)
Monocytes Absolute: 1 10*3/uL (ref 0.1–1.0)
Monocytes Relative: 12 %
Neutro Abs: 4.9 10*3/uL (ref 1.7–7.7)
Neutrophils Relative %: 58 %
Platelets: 357 10*3/uL (ref 150–400)
RBC: 4.8 MIL/uL (ref 4.22–5.81)
RDW: 12.6 % (ref 11.5–15.5)
WBC: 8.3 10*3/uL (ref 4.0–10.5)
nRBC: 0 % (ref 0.0–0.2)

## 2020-07-12 LAB — MAGNESIUM: Magnesium: 2.2 mg/dL (ref 1.7–2.4)

## 2020-07-12 LAB — GLUCOSE, CAPILLARY
Glucose-Capillary: 103 mg/dL — ABNORMAL HIGH (ref 70–99)
Glucose-Capillary: 103 mg/dL — ABNORMAL HIGH (ref 70–99)
Glucose-Capillary: 78 mg/dL (ref 70–99)
Glucose-Capillary: 84 mg/dL (ref 70–99)
Glucose-Capillary: 85 mg/dL (ref 70–99)
Glucose-Capillary: 86 mg/dL (ref 70–99)

## 2020-07-12 LAB — PHOSPHORUS: Phosphorus: 4.8 mg/dL — ABNORMAL HIGH (ref 2.5–4.6)

## 2020-07-12 LAB — VALPROIC ACID LEVEL: Valproic Acid Lvl: 95 ug/mL (ref 50.0–100.0)

## 2020-07-12 LAB — AMMONIA: Ammonia: 44 umol/L — ABNORMAL HIGH (ref 9–35)

## 2020-07-12 MED ORDER — PROPOFOL 1000 MG/100ML IV EMUL
0.0000 ug/kg/min | INTRAVENOUS | Status: DC
  Administered 2020-07-12: 35 ug/kg/min via INTRAVENOUS
  Administered 2020-07-13: 30 ug/kg/min via INTRAVENOUS
  Administered 2020-07-13: 35 ug/kg/min via INTRAVENOUS
  Filled 2020-07-12 (×3): qty 100

## 2020-07-12 MED ORDER — VITAL 1.5 CAL PO LIQD: 1000.0000 mL | ORAL | Status: DC

## 2020-07-12 MED ORDER — FENTANYL CITRATE (PF) 100 MCG/2ML IJ SOLN: 50.0000 ug | INTRAMUSCULAR | Status: DC | PRN

## 2020-07-12 MED ORDER — ONDANSETRON HCL 4 MG/2ML IJ SOLN
4.0000 mg | Freq: Four times a day (QID) | INTRAMUSCULAR | Status: DC | PRN
  Administered 2020-07-12 (×2): 4 mg via INTRAVENOUS
  Filled 2020-07-12 (×3): qty 2

## 2020-07-12 MED ORDER — MIDAZOLAM HCL 2 MG/2ML IJ SOLN: 2.0000 mg | INTRAMUSCULAR | Status: DC | PRN

## 2020-07-12 MED ORDER — DIVALPROEX SODIUM 500 MG PO DR TAB
1250.0000 mg | DELAYED_RELEASE_TABLET | Freq: Two times a day (BID) | ORAL | Status: DC
  Filled 2020-07-12 (×2): qty 1

## 2020-07-12 MED ORDER — DOCUSATE SODIUM 50 MG/5ML PO LIQD
100.0000 mg | Freq: Two times a day (BID) | ORAL | Status: DC
  Filled 2020-07-12 (×2): qty 10

## 2020-07-12 MED ORDER — ALPRAZOLAM 0.5 MG PO TABS
2.0000 mg | ORAL_TABLET | Freq: Three times a day (TID) | ORAL | Status: DC | PRN
  Administered 2020-07-12 – 2020-07-13 (×2): 2 mg
  Filled 2020-07-12: qty 4

## 2020-07-12 MED ORDER — POLYETHYLENE GLYCOL 3350 17 G PO PACK: 17.0000 g | PACK | Freq: Every day | ORAL | Status: DC

## 2020-07-12 MED ORDER — GADOBUTROL 1 MMOL/ML IV SOLN
9.0000 mL | Freq: Once | INTRAVENOUS | Status: AC | PRN
  Administered 2020-07-12: 9 mL via INTRAVENOUS

## 2020-07-12 MED ORDER — DEXTROSE-NACL 5-0.45 % IV SOLN: INTRAVENOUS | Status: DC

## 2020-07-12 MED ORDER — PROPOFOL 1000 MG/100ML IV EMUL
INTRAVENOUS | Status: AC
  Filled 2020-07-12: qty 100

## 2020-07-12 MED ORDER — BISACODYL 10 MG RE SUPP
10.0000 mg | Freq: Once | RECTAL | Status: AC
  Administered 2020-07-12: 10 mg via RECTAL
  Filled 2020-07-12: qty 1

## 2020-07-12 MED ORDER — ORAL CARE MOUTH RINSE
15.0000 mL | OROMUCOSAL | Status: DC
  Administered 2020-07-12 – 2020-07-13 (×9): 15 mL via OROMUCOSAL

## 2020-07-12 MED ORDER — CHLORHEXIDINE GLUCONATE 0.12% ORAL RINSE (MEDLINE KIT)
15.0000 mL | Freq: Two times a day (BID) | OROMUCOSAL | Status: DC
  Administered 2020-07-12 – 2020-07-20 (×12): 15 mL via OROMUCOSAL

## 2020-07-12 MED ORDER — PHENYTOIN SODIUM 50 MG/ML IJ SOLN
100.0000 mg | Freq: Three times a day (TID) | INTRAMUSCULAR | Status: DC
  Administered 2020-07-12 – 2020-07-17 (×14): 100 mg via INTRAVENOUS
  Filled 2020-07-12 (×14): qty 2

## 2020-07-12 MED ORDER — ONDANSETRON HCL 4 MG/2ML IJ SOLN
INTRAMUSCULAR | Status: AC
  Filled 2020-07-12: qty 2

## 2020-07-12 MED ORDER — LACTATED RINGERS IV BOLUS
500.0000 mL | Freq: Once | INTRAVENOUS | Status: AC
  Administered 2020-07-12: 500 mL via INTRAVENOUS

## 2020-07-12 MED ORDER — SENNOSIDES 8.8 MG/5ML PO SYRP
5.0000 mL | ORAL_SOLUTION | Freq: Two times a day (BID) | ORAL | Status: DC | PRN
  Administered 2020-07-12 (×2): 5 mL
  Filled 2020-07-12 (×2): qty 5

## 2020-07-12 MED ORDER — POLYETHYLENE GLYCOL 3350 17 G PO PACK
17.0000 g | PACK | Freq: Every day | ORAL | Status: DC
  Administered 2020-07-12 – 2020-07-15 (×4): 17 g
  Filled 2020-07-12 (×5): qty 1

## 2020-07-12 MED ORDER — PROSOURCE TF PO LIQD
45.0000 mL | Freq: Every day | ORAL | Status: DC
  Administered 2020-07-13: 45 mL
  Filled 2020-07-12: qty 45

## 2020-07-12 MED ORDER — VALPROATE SODIUM 500 MG/5ML IV SOLN
500.0000 mg | Freq: Four times a day (QID) | INTRAVENOUS | Status: DC
  Administered 2020-07-12 – 2020-07-19 (×29): 500 mg via INTRAVENOUS
  Filled 2020-07-12 (×31): qty 5

## 2020-07-12 MED ORDER — PROPOFOL 500 MG/50ML IV EMUL
INTRAVENOUS | Status: AC
  Administered 2020-07-12: 10 ug/kg/min via INTRAVENOUS
  Filled 2020-07-12: qty 50

## 2020-07-12 MED ORDER — IOHEXOL 300 MG/ML  SOLN
100.0000 mL | Freq: Once | INTRAMUSCULAR | Status: AC | PRN
  Administered 2020-07-12: 100 mL via INTRAVENOUS

## 2020-07-12 NOTE — Progress Notes (Signed)
EEG complete - results pending 

## 2020-07-12 NOTE — Progress Notes (Signed)
NAME:  Dylan Moon, MRN:  [id number], DOB:  09/06/1996, LOS: 11 ADMISSION DATE:  07/01/2020, CONSULTATION DATE:  10/9 REFERRING MD:  ED, CHIEF COMPLAINT:  Seizure   Brief History   24 y/o male with history of seizures admitted on 10/9 in the setting of over 30 minutes of seizure activity.  Extubated 10/13.  Past Medical History  Epilepsy Anxiety ADHD  Significant Hospital Events   Intubated in ER 10/9 Extubated 10/13  Consults:  Neuro  Procedures:    Significant Diagnostic Tests:  CT Head 10/9 >> Negative for intracranial abnormalities   cEEG 10/9 - 10/10 >> Negative for seizures or epileptiform discharges. Severe diffuse encephalopathy.  10/18 valproate level elevated and ammonia elevated  10/20 MRI brain > negative for acute intracranial abnormality, but positive for a small left-side subependymal gray matter heterotopia, stable since 2018.  No evidence of mesial temporal sclerosis, fluid in pharynx, mastoid, paranasal sinuses  10/20 LP > no significant WBC or RBC  Micro Data:  10/9 sars: negative 10/10 blood: ngtd  Antimicrobials:  Cefepime 10/14 >> 10/14 Zosyn 10/14 >> 10/20  Interim history/subjective:   Intubated yesterday for MRI brain and LP  Objective   Blood pressure (!) 87/78, pulse (!) 55, temperature (!) 97.5 F (36.4 C), temperature source Axillary, resp. rate 14, height 6' (1.829 m), weight 87.5 kg, SpO2 98 %.    Vent Mode: PRVC FiO2 (%):  [40 %] 40 % Set Rate:  [14 bmp] 14 bmp Vt Set:  [620 mL] 620 mL PEEP:  [5 cmH20] 5 cmH20 Plateau Pressure:  [16 cmH20-22 cmH20] 22 cmH20   Intake/Output Summary (Last 24 hours) at 07/12/2020 0842 Last data filed at 07/12/2020 0800 Gross per 24 hour  Intake 2044.05 ml  Output 1350 ml  Net 694.05 ml   Filed Weights   07/10/20 0500 07/11/20 0403 07/12/20 0500  Weight: 87.6 kg 87.6 kg 87.5 kg    Examination:  General:  In bed on vent HENT: NCAT ETT in place PULM: CTA B, vent supported  breathing CV: RRR, no mgr GI: BS+, soft, nontender MSK: normal bulk and tone Neuro: sedated on vent     Resolved Hospital Problem list     Assessment & Plan:  Status epilepticus > no further seizures Acute metabolic encephalopathy > continued work up unrevealing, nothing on LP or MRI to explain his profound behavioral disturbances.  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 Do not suspect substance withdrawal > appreciate neurology and psyche assistance > f/u ammonia level > continue lactulose > haldol prn > precedex today > escitalopram, buspar, zyprexa per psych > continue soft restraitns > prn xanax > discuss with psyche> inpatient placement  Acute respiratory failure due to inability to protect airway in setting of needing procedures on 10/19; resolved > plan extubation today > vap precautions  Right lower lobe pneumonia > five days of antibiotics  Need to advance diet today  Best practice:  Diet: regular Pain/Anxiety/Delirium protocol (if indicated): n/a VAP protocol (if indicated): n/a DVT prophylaxis: lovenox GI prophylaxis: famotidine Glucose control: SSI Mobility: bed rest Code Status: full Family Communication: updated mother bedside on 10/19, by phone on 10/20 Disposition:   Labs   CBC: Recent Labs  Lab 07/06/20 1157 07/07/20 0317 07/08/20 0517 07/11/20 0431  WBC 7.9 6.8 5.7 5.2  NEUTROABS 5.2  --   --  2.2  HGB 13.8  13.5 13.7 15.5  HCT 42.3 41.2 41.7 46.3  MCV 87.4 87.5 86.0 86.1  PLT 218 224 282 379    Basic Metabolic Panel: Recent Labs  Lab 07/07/20 0317 07/08/20 0517 07/11/20 0431 07/11/20 1535  NA 138 136 137  --   K 3.4* 4.2 4.8  --   CL 103 104 105  --   CO2 26 23 23   --   GLUCOSE 137* 133* 101*  --   BUN 14 17 19   --   CREATININE 0.98 0.89 1.14  --   CALCIUM 9.0 9.2 9.2  --   MG 1.8 2.1  --   2.1  PHOS 4.1 4.2  --  4.7*   GFR: Estimated Creatinine Clearance: 108.7 mL/min (by C-G formula based on SCr of 1.14 mg/dL). Recent Labs  Lab 07/06/20 1157 07/07/20 0317 07/08/20 0517 07/11/20 0431  PROCALCITON 0.12 <0.10 <0.10  --   WBC 7.9 6.8 5.7 5.2    Liver Function Tests: Recent Labs  Lab 07/07/20 0317 07/11/20 0431  AST 54* 36  ALT 31 25  ALKPHOS 36* 38  BILITOT 0.3 0.5  PROT 6.2* 6.4*  ALBUMIN 2.9* 3.2*   No results for input(s): LIPASE, AMYLASE in the last 168 hours. Recent Labs  Lab 07/10/20 1155  AMMONIA 60*    ABG    Component Value Date/Time   PHART 7.414 07/11/2020 1650   PCO2ART 36.4 07/11/2020 1650   PO2ART 87.3 07/11/2020 1650   HCO3 22.8 07/11/2020 1650   TCO2 26 07/03/2020 0319   ACIDBASEDEF 1.1 07/11/2020 1650   O2SAT 96.1 07/11/2020 1650     Coagulation Profile: No results for input(s): INR, PROTIME in the last 168 hours.  Cardiac Enzymes: No results for input(s): CKTOTAL, CKMB, CKMBINDEX, TROPONINI in the last 168 hours.  HbA1C: Hgb A1c MFr Bld  Date/Time Value Ref Range Status  12/24/2019 05:13 PM 5.8 (H) 4.8 - 5.6 % Final    Comment:    (NOTE) Pre diabetes:          5.7%-6.4% Diabetes:              >6.4% Glycemic control for   <7.0% adults with diabetes     CBG: Recent Labs  Lab 07/08/20 0749 07/08/20 1135 07/11/20 2343 07/12/20 0344 07/12/20 0839  GLUCAP 93 102* 98 86 84     Critical care time: 31 minutes     07/14/20, MD [place] [facility] Pager: [phone] Cell: [phone] If no response, call [phone]

## 2020-07-12 NOTE — Plan of Care (Signed)
  Problem: Education: Goal: Knowledge of General Education information will improve Description: Including pain rating scale, medication(s)/side effects and non-pharmacologic comfort measures Outcome: Not Progressing   Problem: Health Behavior/Discharge Planning: Goal: Ability to manage health-related needs will improve Outcome: Not Progressing   Problem: Clinical Measurements: Goal: Diagnostic test results will improve Outcome: Not Progressing   Problem: Nutrition: Goal: Adequate nutrition will be maintained Outcome: Not Progressing   Problem: Elimination: Goal: Will not experience complications related to bowel motility Outcome: Not Progressing   Problem: Clinical Measurements: Goal: Diagnostic test results will improve Outcome: Not Progressing

## 2020-07-12 NOTE — Progress Notes (Signed)
Subjective: No seizures overnight.  Patient had lumbar puncture MRI brain.  ROS: Unable to obtain due to poor mental status  Examination  Vital signs in last 24 hours: Temp:  [97.4 F (36.3 C)-97.5 F (36.4 C)] 97.4 F (36.3 C) (10/20 1245) Pulse Rate:  [49-83] 66 (10/20 1430) Resp:  [14-28] 14 (10/20 1430) BP: (75-224)/(44-201) 105/65 (10/20 1430) SpO2:  [95 %-100 %] 97 % (10/20 1430) FiO2 (%):  [40 %] 40 % (10/20 1155) Weight:  [87.5 kg] 87.5 kg (10/20 0500)  General: lying in bed, not in apparent distress CVS: pulse-normal rate and rhythm RS: breathing comfortably,  intubated Extremities: normal, warm Neuro:  Sedated on propofol at 20 MCG per hour, opens eyes to noxious stimuli, does not follow commands, pupils equally round reactive precordial reflex intact, gag reflex intact, withdraws to noxious stimuli in all 4 extremities  Basic Metabolic Panel: Recent Labs  Lab 07/07/20 0317 07/07/20 0317 07/08/20 0517 07/11/20 0431 07/11/20 1535 07/12/20 0955  NA 138  --  136 137  --  138  K 3.4*  --  4.2 4.8  --  4.7  CL 103  --  104 105  --  104  CO2 26  --  23 23  --  21*  GLUCOSE 137*  --  133* 101*  --  87  BUN 14  --  17 19  --  23*  CREATININE 0.98  --  0.89 1.14  --  1.14  CALCIUM 9.0   < > 9.2 9.2  --  9.3  MG 1.8  --  2.1  --  2.1 2.2  PHOS 4.1  --  4.2  --  4.7* 4.8*   < > = values in this interval not displayed.    CBC: Recent Labs  Lab 07/06/20 1157 07/07/20 0317 07/08/20 0517 07/11/20 0431 07/12/20 0955  WBC 7.9 6.8 5.7 5.2 8.3  NEUTROABS 5.2  --   --  2.2 4.9  HGB 13.8 13.5 13.7 15.5 14.2  HCT 42.3 41.2 41.7 46.3 42.5  MCV 87.4 87.5 86.0 86.1 88.5  PLT 218 224 282 379 357     Coagulation Studies: No results for input(s): LABPROT, INR in the last 72 hours.  Imaging MRI brain with and without contrast 07/12/2020:Negative for acute intracranial abnormality, but positive for a small left-side subependymal gray matter heterotopia, stable since  2018. No other migrational abnormality identified. No evidence of mesial temporal sclerosis.    ASSESSMENT AND PLAN: 24 year old male with history of epilepsy who presented with breakthrough seizures.  Epilepsy with breakthrough seizures Acute encephalopathy, toxic- metabolic Hyperammonemia (improving) Elevated previously (resolved) -Patient intubated and sedated.  LP did not show any evidence of meningitis.  MRI brain did not show any acute abnormality.  Recommendations -Continue valproic acid 500 mg every 6 hours, this can be switched to 1000 mg twice daily when patient is able to tolerate p.o. -Continue management of hyperammonemia with lactulose -Continue eslicarbazepine at current dose -We will obtain EEG to look for any acute seizures -Differentials for encephalopathy include hyperammonemia, postictal psychosis.  Patient is already on multiple medications including BuSpar, olanzapine and escitalopram.  Therefore recommend using Ativan as needed if patient is agitated after extubation again. -Continue seizure precautions - As needed IV Ativan 2 mg clinical seizures  -Management of rest of the comorbidities per primary team -Will also call patient's mother to give her an update  I have spent a total of 35 minuteswith the patient reviewing hospitalnotes,  test results,  labs and examining the patient as well as establishing an assessment and plan.>50% of time was spent in direct patient care.  Lindie Spruce Epilepsy [facility] For questions after 5pm please refer to AMION to reach the Neurologist on call

## 2020-07-12 NOTE — Progress Notes (Signed)
Paged MD McQuaid with CCM about pt's 3x episodes of emesis. Pt agitated at this time. Per MD McQuaid- hold off on extubating patient at this time. MD McQuaid placing orders.

## 2020-07-12 NOTE — Progress Notes (Signed)
Brief Nutrition Follow-up:  Consult received for initiation and management of TF. Noted pt intubated yesterday evening and LP performed.   Overnight pt with emesis and continues with emesis today. No BM x 11 days. KUB pending. TF on hold  Pt seen and evaluated yesterday, plan to re-assess tomorrow for vent status, GI function, nutrition poc  Romelle Starcher MS, RDN, LDN, CNSC Registered Dietitian III Clinical Nutrition RD Pager and On-Call Pager Number Located in [place]

## 2020-07-12 NOTE — Procedures (Signed)
Patient Name: Dylan Moon  MRN: [id number]  Epilepsy Attending: Charlsie Quest  Referring Physician/Provider: Dr Lindie Spruce Date: 07/12/2020 Duration: 23.23 mins  Patient history: 24yo M with h/o epilepsy who presented with breakthrough seizure. EEG to evaluate for seizure  Level of alertness:  Comatose  AEDs during EEG study: VPA,  propofol  Technical aspects: This EEG study was done with scalp electrodes positioned according to the 10-20 International system of electrode placement. Electrical activity was acquired at a sampling rate of 500Hz  and reviewed with a high frequency filter of 70Hz  and a low frequency filter of 1Hz . EEG data were recorded continuously and digitally stored.   Description:  EEG showed continuous generalized 3-6Hz  theta-delta slowing. Hyperventilation and photic stimulation were not performed.     ABNORMALITY -Continuous slow, generalized   IMPRESSION: This study is suggestive of severe diffuse encephalopathy, nonspecific etiology but likely related to sedation. No seizures or epileptiform discharges were seen throughout the recording.   Yeilyn Gent 

## 2020-07-12 NOTE — Progress Notes (Signed)
[facility]  Profound vomiting No bowel movement in days Hold extubation Renew sedation/vent orders KUB Consider extubation later today if no sign of SBO Suppository/miralax  Heber Yale, MD [place] [facility] Pager: [phone] Cell: [phone] If no response, call [phone]

## 2020-07-12 NOTE — Progress Notes (Signed)
eLink Physician-Brief Progress Note Patient Name: Dylan Moon DOB: Jan 24, 1996 MRN: [id number]   Date of Service  07/12/2020  HPI/Events of Note  Patient has had 2 episodes of emesis.NGT placed to LIS. OTc interval = 0.41 seconds.   eICU Interventions  Will order: 1. Zofran 4 mg IV Q 6 hours PRN N/V.     Intervention Category Major Interventions: Other:  Lenell Antu 07/12/2020, 4:35 AM

## 2020-07-12 NOTE — Progress Notes (Signed)
[facility] afternoon rounds  Portable abdomen image reviewed: colonic ileus vs obstruction Will discuss with general surgery: CT vs enema?  Heber Fulton, MD [place] [facility] Pager: [phone] Cell: [phone] If no response, call [phone]

## 2020-07-12 NOTE — Progress Notes (Signed)
Patient transported to MRI 

## 2020-07-12 NOTE — Progress Notes (Signed)
Patient CBG lower than 90, MD Lupita Leash notified. He has ordered D5 1/2 NS at 75 ml/hr with verbal read back. RN continuing to monitor

## 2020-07-12 NOTE — Progress Notes (Signed)
Patient transported back from MRI to 4N32 without incidence.

## 2020-07-13 DIAGNOSIS — F4325 Adjustment disorder with mixed disturbance of emotions and conduct: Secondary | ICD-10-CM | POA: Diagnosis not present

## 2020-07-13 DIAGNOSIS — G40901 Epilepsy, unspecified, not intractable, with status epilepticus: Secondary | ICD-10-CM | POA: Diagnosis not present

## 2020-07-13 LAB — COMPREHENSIVE METABOLIC PANEL
ALT: 25 U/L (ref 0–44)
AST: 26 U/L (ref 15–41)
Albumin: 2.9 g/dL — ABNORMAL LOW (ref 3.5–5.0)
Alkaline Phosphatase: 42 U/L (ref 38–126)
Anion gap: 10 (ref 5–15)
BUN: 17 mg/dL (ref 6–20)
CO2: 22 mmol/L (ref 22–32)
Calcium: 9 mg/dL (ref 8.9–10.3)
Chloride: 105 mmol/L (ref 98–111)
Creatinine, Ser: 0.99 mg/dL (ref 0.61–1.24)
GFR, Estimated: >60 mL/min (ref >60)
Glucose, Bld: 125 mg/dL — ABNORMAL HIGH (ref 70–99)
Potassium: 3.9 mmol/L (ref 3.5–5.1)
Sodium: 137 mmol/L (ref 135–145)
Total Bilirubin: 0.3 mg/dL (ref 0.3–1.2)
Total Protein: 6.1 g/dL — ABNORMAL LOW (ref 6.5–8.1)

## 2020-07-13 LAB — HSV 1/2 PCR, CSF
HSV-1 DNA: NEGATIVE
HSV-2 DNA: NEGATIVE

## 2020-07-13 LAB — CBC WITH DIFFERENTIAL/PLATELET
Abs Immature Granulocytes: 0.07 10*3/uL (ref 0.00–0.07)
Basophils Absolute: 0 10*3/uL (ref 0.0–0.1)
Basophils Relative: 0 %
Eosinophils Absolute: 0.1 10*3/uL (ref 0.0–0.5)
Eosinophils Relative: 1 %
HCT: 41.6 % (ref 39.0–52.0)
Hemoglobin: 13.9 g/dL (ref 13.0–17.0)
Immature Granulocytes: 1 %
Lymphocytes Relative: 28 %
Lymphs Abs: 1.9 10*3/uL (ref 0.7–4.0)
MCH: 29 pg (ref 26.0–34.0)
MCHC: 33.4 g/dL (ref 30.0–36.0)
MCV: 86.8 fL (ref 80.0–100.0)
Monocytes Absolute: 0.8 10*3/uL (ref 0.1–1.0)
Monocytes Relative: 13 %
Neutro Abs: 3.8 10*3/uL (ref 1.7–7.7)
Neutrophils Relative %: 57 %
Platelets: 394 10*3/uL (ref 150–400)
RBC: 4.79 MIL/uL (ref 4.22–5.81)
RDW: 12.8 % (ref 11.5–15.5)
WBC: 6.7 10*3/uL (ref 4.0–10.5)
nRBC: 0 % (ref 0.0–0.2)

## 2020-07-13 LAB — GLUCOSE, CAPILLARY
Glucose-Capillary: 109 mg/dL — ABNORMAL HIGH (ref 70–99)
Glucose-Capillary: 112 mg/dL — ABNORMAL HIGH (ref 70–99)
Glucose-Capillary: 113 mg/dL — ABNORMAL HIGH (ref 70–99)
Glucose-Capillary: 89 mg/dL (ref 70–99)
Glucose-Capillary: 95 mg/dL (ref 70–99)

## 2020-07-13 LAB — PHOSPHORUS: Phosphorus: 4.4 mg/dL (ref 2.5–4.6)

## 2020-07-13 LAB — MAGNESIUM: Magnesium: 1.8 mg/dL (ref 1.7–2.4)

## 2020-07-13 LAB — TRIGLYCERIDES: Triglycerides: 209 mg/dL — ABNORMAL HIGH (ref <150)

## 2020-07-13 LAB — CYTOLOGY - NON PAP

## 2020-07-13 MED ORDER — DOCUSATE SODIUM 50 MG/5ML PO LIQD: 100.0000 mg | Freq: Two times a day (BID) | ORAL | Status: DC | PRN

## 2020-07-13 MED ORDER — NEOSTIGMINE METHYLSULFATE 10 MG/10ML IV SOLN
2.0000 mg | Freq: Once | INTRAVENOUS | Status: AC
  Administered 2020-07-13: 2 mg via INTRAVENOUS
  Filled 2020-07-13 (×2): qty 2

## 2020-07-13 MED ORDER — OLANZAPINE 10 MG PO TABS
10.0000 mg | ORAL_TABLET | Freq: Two times a day (BID) | ORAL | Status: DC
  Administered 2020-07-14 (×2): 10 mg via ORAL
  Filled 2020-07-13 (×3): qty 1

## 2020-07-13 MED ORDER — HALOPERIDOL LACTATE 5 MG/ML IJ SOLN
5.0000 mg | Freq: Four times a day (QID) | INTRAMUSCULAR | Status: DC | PRN
  Administered 2020-07-13 – 2020-07-17 (×5): 5 mg via INTRAVENOUS
  Filled 2020-07-13 (×8): qty 1

## 2020-07-13 MED ORDER — ESCITALOPRAM OXALATE 10 MG PO TABS
5.0000 mg | ORAL_TABLET | Freq: Every day | ORAL | Status: DC
  Administered 2020-07-14: 5 mg via ORAL
  Filled 2020-07-13: qty 1

## 2020-07-13 MED ORDER — STERILE WATER FOR INJECTION IJ SOLN
INTRAMUSCULAR | Status: AC
  Administered 2020-07-13: 10 mL
  Filled 2020-07-13: qty 10

## 2020-07-13 MED ORDER — LACTULOSE 10 GM/15ML PO SOLN
30.0000 g | Freq: Two times a day (BID) | ORAL | Status: DC
  Administered 2020-07-14 – 2020-07-15 (×4): 30 g via ORAL
  Filled 2020-07-13 (×5): qty 45

## 2020-07-13 MED ORDER — ZIPRASIDONE MESYLATE 20 MG IM SOLR
10.0000 mg | Freq: Once | INTRAMUSCULAR | Status: AC
  Administered 2020-07-13: 10 mg via INTRAMUSCULAR
  Filled 2020-07-13: qty 20

## 2020-07-13 MED ORDER — ATROPINE SULFATE 1 MG/ML IJ SOLN
1.0000 mg | Freq: Once | INTRAMUSCULAR | Status: DC | PRN
  Filled 2020-07-13: qty 1

## 2020-07-13 MED ORDER — ALPRAZOLAM 0.5 MG PO TABS
2.0000 mg | ORAL_TABLET | Freq: Three times a day (TID) | ORAL | Status: DC | PRN
  Administered 2020-07-13 – 2020-07-20 (×13): 2 mg via ORAL
  Filled 2020-07-13 (×15): qty 4

## 2020-07-13 MED ORDER — BUSPIRONE HCL 15 MG PO TABS
7.5000 mg | ORAL_TABLET | Freq: Two times a day (BID) | ORAL | Status: DC
  Administered 2020-07-14 (×2): 7.5 mg via ORAL
  Filled 2020-07-13 (×2): qty 1

## 2020-07-13 MED ORDER — ZIPRASIDONE MESYLATE 20 MG IM SOLR
20.0000 mg | Freq: Once | INTRAMUSCULAR | Status: DC
  Filled 2020-07-13: qty 20

## 2020-07-13 MED ORDER — SENNOSIDES 8.8 MG/5ML PO SYRP: 5.0000 mL | ORAL_SOLUTION | Freq: Two times a day (BID) | ORAL | Status: DC | PRN

## 2020-07-13 MED ORDER — ESLICARBAZEPINE ACETATE 800 MG PO TABS
1600.0000 mg | ORAL_TABLET | Freq: Every day | ORAL | Status: DC
  Administered 2020-07-14 – 2020-07-20 (×7): 1600 mg via ORAL
  Filled 2020-07-13 (×7): qty 2

## 2020-07-13 MED ORDER — LORAZEPAM 2 MG/ML IJ SOLN
2.0000 mg | Freq: Once | INTRAMUSCULAR | Status: AC
  Administered 2020-07-13: 2 mg via INTRAMUSCULAR
  Filled 2020-07-13: qty 1

## 2020-07-13 MED ORDER — HALOPERIDOL LACTATE 5 MG/ML IJ SOLN
5.0000 mg | Freq: Once | INTRAMUSCULAR | Status: AC
  Administered 2020-07-13: 5 mg via INTRAMUSCULAR

## 2020-07-13 MED ORDER — ZIPRASIDONE MESYLATE 20 MG IM SOLR: 10.0000 mg | Freq: Once | INTRAMUSCULAR | Status: DC

## 2020-07-13 MED ORDER — FAMOTIDINE 20 MG PO TABS
20.0000 mg | ORAL_TABLET | Freq: Two times a day (BID) | ORAL | Status: DC
  Administered 2020-07-14 – 2020-07-20 (×14): 20 mg via ORAL
  Filled 2020-07-13 (×15): qty 1

## 2020-07-13 MED ORDER — STERILE WATER FOR INJECTION IJ SOLN
INTRAMUSCULAR | Status: AC
  Filled 2020-07-13: qty 10

## 2020-07-13 MED ORDER — ACETAMINOPHEN 160 MG/5ML PO SOLN: 650.0000 mg | Freq: Four times a day (QID) | ORAL | Status: DC | PRN

## 2020-07-13 NOTE — Progress Notes (Addendum)
Patient combative, trying to get out of bed, yelling, thrashing limbs around. Patient attempting to spit on staff and threatening "I'm going to punch you in your face" at this RN. 5mg  haldol given via IV. It was found that pt's IV was pulled out and patient did not get dose of haldol. Mask placed over pt's face. Pt has urinated in bed. Unable to clean patient up at this time due to pt being combative.   MD McQuaid at bedside.

## 2020-07-13 NOTE — Procedures (Signed)
Extubation Procedure Note  Patient Details:   Name: Dylan Moon DOB: [birth year] MRN: [id number]   Airway Documentation:    Vent end date: 07/13/20 Vent end time: 1130   Evaluation  O2 sats: stable throughout Complications: No apparent complications Patient did tolerate procedure well. Bilateral Breath Sounds: Clear, Diminished   Patient extubated per MD order & placed on 4L [place]. Patient tolerated well.  Jacqulynn Cadet 07/13/2020, 11:36 AM

## 2020-07-13 NOTE — Progress Notes (Addendum)
Pt having hallucinations throughout shift. Pt states "why didn't you tell me there were other people in the room?" as he looks at wall with no people in room. Reports his sister is here with her baby.  Pt also seems to be speaking to [employer], pt states, "Soulja Boy I'm on my way to your concert. Soulja Boy don't start with outme" MD McQuaid aware.

## 2020-07-13 NOTE — Progress Notes (Signed)
Pt pulled out only IV. Spoke with MD Ogan on phone and informed of pt's increasing agitation and violent behavior. MD Ogan also informed of increased worsening involuntary tongue movements/ sticking out tongue non-stop and tremors noted in patient over the last 1-2 hours.  Verbal order received to give one time dose 5mg  IM haldol in an attempt to sedate patient in an attempt to obtain IV access.

## 2020-07-13 NOTE — Progress Notes (Signed)
NAME:  Dylan Moon, MRN:  [id number], DOB:  05-Mar-1996, LOS: 12 ADMISSION DATE:  07/01/2020, CONSULTATION DATE:  10/9 REFERRING MD:  ED, CHIEF COMPLAINT:  Seizure   Brief History   24 y/o male with history of seizures admitted on 10/9 in the setting of over 30 minutes of seizure activity.  Extubated 10/13.  Past Medical History  Epilepsy Anxiety ADHD  Significant Hospital Events   Intubated in ER 10/9 Extubated 10/13  Consults:  Neuro  Procedures:    Significant Diagnostic Tests:  CT Head 10/9 >> Negative for intracranial abnormalities   cEEG 10/9 - 10/10 >> Negative for seizures or epileptiform discharges. Severe diffuse encephalopathy.  10/18 valproate level elevated and ammonia elevated  10/20 MRI brain > negative for acute intracranial abnormality, but positive for a small left-side subependymal gray matter heterotopia, stable since 2018.  No evidence of mesial temporal sclerosis, fluid in pharynx, mastoid, paranasal sinuses  10/20 LP > no significant WBC or RBC  10/20 CT abdomen/pelvis > colonic ileus, RLL infiltrate  Micro Data:  10/9 sars: negative 10/10 blood: ngtd  Antimicrobials:  Cefepime 10/14 >> 10/14 Zosyn 10/14 >> 10/20  Interim history/subjective:   CT performed yesterday > colonic ileus, had two bowel movement  Objective   Blood pressure 138/72, pulse (!) 53, temperature (!) 97.4 F (36.3 C), temperature source Axillary, resp. rate 14, height 6' (1.829 m), weight 87 kg, SpO2 98 %.    Vent Mode: PRVC FiO2 (%):  [40 %] 40 % Set Rate:  [14 bmp] 14 bmp Vt Set:  [620 mL] 620 mL PEEP:  [5 cmH20] 5 cmH20 Pressure Support:  [10 cmH20] 10 cmH20 Plateau Pressure:  [20 cmH20-21 cmH20] 21 cmH20   Intake/Output Summary (Last 24 hours) at 07/13/2020 0735 Last data filed at 07/13/2020 0600 Gross per 24 hour  Intake 1472.17 ml  Output 1000 ml  Net 472.17 ml   Filed Weights   07/11/20 0403 07/12/20 0500 07/13/20 0500  Weight: 87.6 kg 87.5 kg  87 kg    Examination:  General:  In bed on vent HENT: NCAT ETT in place PULM: CTA B, vent supported breathing CV: RRR, no mgr GI: BS+, soft, nontender MSK: normal bulk and tone Neuro: sedated on vent   Resolved Hospital Problem list     Assessment & Plan:  Status epilepticus > no further seizures Behavioral disturbance> strongly suspect his behavioral disturbance is not due to a metabolic derangement and is due to adjustment disorder 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 Do not suspect substance withdrawal > need to extubate today > f/u ammonia level > lactulose > haldol prn > d/c PAD protocol > continue precedex > prin xanax > continue aptiom, valproate > discuss disposition with neurology and behavioral medicine  Acute respiratory failure due to inability to protect airway in setting of needing procedures on 10/19; resolved > extubation today > vap precautions  Right lower lobe pneumonia > monitor off antibiotics  Colonic ileus > bowel regimen > minimize  > neostigmine x1 dose > if no improvement then call GI  Best practice:  Diet: regular Pain/Anxiety/Delirium protocol (if indicated): n/a VAP protocol (if indicated): n/a DVT prophylaxis: lovenox GI prophylaxis: famotidine Glucose control: SSI Mobility: bed rest Code Status: full Family Communication: updated mother by phone on 10/21 Disposition:   Labs   CBC: Recent Labs  Lab 07/06/20 1157 07/06/20 1157 07/07/20 0317 07/08/20 0517 07/11/20 0431 07/12/20 0955 07/13/20 0147  WBC 7.9   < > 6.8 5.7  5.2 8.3 6.7  NEUTROABS 5.2  --   --   --  2.2 4.9 3.8  HGB 13.8   < > 13.5 13.7 15.5 14.2 13.9  HCT 42.3   < > 41.2 41.7 46.3 42.5 41.6  MCV 87.4   < > 87.5 86.0 86.1 88.5 86.8  PLT 218   < > 224 282 379 357 394   < > = values in this interval not displayed.    Basic Metabolic Panel: Recent Labs  Lab 07/07/20 0317 07/08/20 0517 07/11/20 0431 07/11/20 1535  07/12/20 0955 07/13/20 0147  NA 138 136 137  --  138 137  K 3.4* 4.2 4.8  --  4.7 3.9  CL 103 104 105  --  104 105  CO2 26 23 23   --  21* 22  GLUCOSE 137* 133* 101*  --  87 125*  BUN 14 17 19   --  23* 17  CREATININE 0.98 0.89 1.14  --  1.14 0.99  CALCIUM 9.0 9.2 9.2  --  9.3 9.0  MG 1.8 2.1  --  2.1 2.2 1.8  PHOS 4.1 4.2  --  4.7* 4.8* 4.4   GFR: Estimated Creatinine Clearance: 125.2 mL/min (by C-G formula based on SCr of 0.99 mg/dL). Recent Labs  Lab 07/06/20 1157 07/06/20 1157 07/07/20 0317 07/07/20 0317 07/08/20 0517 07/11/20 0431 07/12/20 0955 07/13/20 0147  PROCALCITON 0.12  --  <0.10  --  <0.10  --   --   --   WBC 7.9   < > 6.8   < > 5.7 5.2 8.3 6.7   < > = values in this interval not displayed.    Liver Function Tests: Recent Labs  Lab 07/07/20 0317 07/11/20 0431 07/12/20 0955 07/13/20 0147  AST 54* 36 38 26  ALT 31 25 31 25   ALKPHOS 36* 38 39 42  BILITOT 0.3 0.5 0.3 0.3  PROT 6.2* 6.4* 6.9 6.1*  ALBUMIN 2.9* 3.2* 3.2* 2.9*   No results for input(s): LIPASE, AMYLASE in the last 168 hours. Recent Labs  Lab 07/10/20 1155 07/12/20 0955  AMMONIA 60* 44*    ABG    Component Value Date/Time   PHART 7.414 07/11/2020 1650   PCO2ART 36.4 07/11/2020 1650   PO2ART 87.3 07/11/2020 1650   HCO3 22.8 07/11/2020 1650   TCO2 26 07/03/2020 0319   ACIDBASEDEF 1.1 07/11/2020 1650   O2SAT 96.1 07/11/2020 1650     Coagulation Profile: No results for input(s): INR, PROTIME in the last 168 hours.  Cardiac Enzymes: No results for input(s): CKTOTAL, CKMB, CKMBINDEX, TROPONINI in the last 168 hours.  HbA1C: Hgb A1c MFr Bld  Date/Time Value Ref Range Status  12/24/2019 05:13 PM 5.8 (H) 4.8 - 5.6 % Final    Comment:    (NOTE) Pre diabetes:          5.7%-6.4% Diabetes:              >6.4% Glycemic control for   <7.0% adults with diabetes     CBG: Recent Labs  Lab 07/12/20 1243 07/12/20 1548 07/12/20 2002 07/12/20 2344 07/13/20 0349  GLUCAP 85 78  103* 103* 112*     Critical care time:  32 minutes     07/14/20, MD [place] [facility] Pager: [phone] Cell: [phone] If no response, call [phone]

## 2020-07-13 NOTE — Progress Notes (Signed)
eLink Physician-Brief Progress Note Patient Name: Dylan Moon DOB: [birth year] MRN: [id number]   Date of Service  07/13/2020  HPI/Events of Note  Patient was extubated earlier today, now appears to heave a psychotic break down which is ongoing from earlier today, and required doses of Geodon and haldol. He has pulled out his iv and is now agitated and hallucinating, bedside RN is also concerned that he is showing signs of Tardive Dyskinesia from prior doses of anti-psychotic but on my brief camera evaluation it appears more psychotic than involuntary movements of Tardive Dyskinesia.  eICU Interventions  Haldol 5 mg IM x 1 given to calm him down enough to get an iv in him and start Precedex, PCCM ground crew requested to go to the room to evaluate patient.        Dylan Moon 07/13/2020, 7:56 PM

## 2020-07-13 NOTE — Progress Notes (Signed)
Pt states "get me my spoon so I can help out the people"  When asked who these people are pt replies "the people standing beside you."  Pt also explains that Josh, who pt states he sees in the room is "killing new born babies." Pt agitated and is removing heart monitor and BP cuff despite placing back on patient. MD McQuaid aware.

## 2020-07-13 NOTE — Progress Notes (Signed)
Pt transported from 4N32 to CT and back without complication. RT will continue to monitor.

## 2020-07-13 NOTE — Progress Notes (Signed)
Subjective: No new seizures.   ROS: unable to obtain due to poor mental status  Examination  Vital signs in last 24 hours: Temp:  [97.4 F (36.3 C)-97.7 F (36.5 C)] 97.6 F (36.4 C) (10/21 1200) Pulse Rate:  [47-89] 62 (10/21 1300) Resp:  [14-28] 26 (10/21 1500) BP: (87-156)/(57-113) 156/113 (10/21 1500) SpO2:  [96 %-100 %] 97 % (10/21 1300) FiO2 (%):  [40 %] 40 % (10/21 0801) Weight:  [87 kg] 87 kg (10/21 0500)  General: lying in bed,not in apparent distress CVS: pulse-normal rate and rhythm RS: breathing comfortably, intubated Extremities: normal,warm Neuro: awake, alert, oriented to self, place, agitated, pupils equally round reactive, spontaneously moving all 4 extremities   Basic Metabolic Panel: Recent Labs  Lab 07/07/20 0317 07/07/20 0317 07/08/20 0517 07/08/20 0517 07/11/20 0431 07/11/20 1535 07/12/20 0955 07/13/20 0147  NA 138  --  136  --  137  --  138 137  K 3.4*  --  4.2  --  4.8  --  4.7 3.9  CL 103  --  104  --  105  --  104 105  CO2 26  --  23  --  23  --  21* 22  GLUCOSE 137*  --  133*  --  101*  --  87 125*  BUN 14  --  17  --  19  --  23* 17  CREATININE 0.98  --  0.89  --  1.14  --  1.14 0.99  CALCIUM 9.0   < > 9.2   < > 9.2  --  9.3 9.0  MG 1.8  --  2.1  --   --  2.1 2.2 1.8  PHOS 4.1  --  4.2  --   --  4.7* 4.8* 4.4   < > = values in this interval not displayed.    CBC: Recent Labs  Lab 07/07/20 0317 07/08/20 0517 07/11/20 0431 07/12/20 0955 07/13/20 0147  WBC 6.8 5.7 5.2 8.3 6.7  NEUTROABS  --   --  2.2 4.9 3.8  HGB 13.5 13.7 15.5 14.2 13.9  HCT 41.2 41.7 46.3 42.5 41.6  MCV 87.5 86.0 86.1 88.5 86.8  PLT 224 282 379 357 394     Coagulation Studies: No results for input(s): LABPROT, INR in the last 72 hours.  Imaging No new brain imaging  ASSESSMENT AND PLAN: 24 year old male with history of epilepsywhopresented with breakthrough seizures.  Epilepsy with breakthrough seizures Acute encephalopathy, toxic-  metabolic Hyperammonemia (improving) -Patient intubated and sedated.  LP did not show any evidence of meningitis.  MRI brain did not show any acute abnormality.  Recommendations -Continue valproic acid 500 mg every 6 hours, this can be switched to 1000 mg twice daily when patient is able to tolerate p.o. - Unable to take aptiom, so on dilantin 100mg  tid. Can resume home dose of aptiom when able to take PO - Ok to continue home seroquel if needed for psychosis.  -Continue management of hyperammonemia with lactulose - As needed IV Ativan 2 mg clinical seizures -Management of rest of the comorbidities per primary team  I have spent a total of20minuteswith the patient reviewing hospitalnotes, test results, labs and examining the patient as well as establishing an assessment and plan.>50% of time was spent in direct patient care.    21m Epilepsy [facility] For questions after 5pm please refer to AMION to reach the Neurologist on call

## 2020-07-13 NOTE — Significant Event (Signed)
Called to patient bedside for severe agitation, currently with no muscle tremors or seizures noted, appears delirious. Throughout the day on 2 mcg/kg/hr Precedex and has received PRN Haldol and Geodon 10 mg x 2. Has scheduled zyprexa, buspar, and lexapro.  On arrival to bedside Precedex was off as patient had pulled out IV. New IV placed. Precedex restarted.

## 2020-07-13 NOTE — Progress Notes (Signed)
[facility]  Called back to bedside IV pulled out Severe agitation: swearing, yelling, kicking bed rails, threatening staff and others, verbally abusive to staff  Plan: Psyche consulted IM geodon now IM lorazepam now Replace IV Restart precedex Resume zyprexa, busar, escitalopram, prn xanax, lactulose  Need to consider placement in [facility]  Heber , MD [place] [facility] Pager: [phone] Cell: [phone] If no response, call [phone]

## 2020-07-13 NOTE — Progress Notes (Signed)
[facility]  Patient describing visual hallucinations: people standing in the room with him.  Heber [place], MD [place] [facility] Pager: [phone] Cell: [phone] If no response, call [phone]

## 2020-07-14 ENCOUNTER — Inpatient Hospital Stay (HOSPITAL_COMMUNITY): Payer: Medicaid Other

## 2020-07-14 DIAGNOSIS — G40901 Epilepsy, unspecified, not intractable, with status epilepticus: Secondary | ICD-10-CM | POA: Diagnosis not present

## 2020-07-14 DIAGNOSIS — F4325 Adjustment disorder with mixed disturbance of emotions and conduct: Secondary | ICD-10-CM | POA: Diagnosis not present

## 2020-07-14 LAB — GLUCOSE, CAPILLARY
Glucose-Capillary: 96 mg/dL (ref 70–99)
Glucose-Capillary: 96 mg/dL (ref 70–99)
Glucose-Capillary: 90 mg/dL (ref 70–99)
Glucose-Capillary: 105 mg/dL — ABNORMAL HIGH (ref 70–99)
Glucose-Capillary: 105 mg/dL — ABNORMAL HIGH (ref 70–99)

## 2020-07-14 LAB — COMPREHENSIVE METABOLIC PANEL
ALT: 28 U/L (ref 0–44)
AST: 55 U/L — ABNORMAL HIGH (ref 15–41)
Albumin: 3.7 g/dL (ref 3.5–5.0)
Alkaline Phosphatase: 43 U/L (ref 38–126)
Anion gap: 16 — ABNORMAL HIGH (ref 5–15)
BUN: 7 mg/dL (ref 6–20)
CO2: 21 mmol/L — ABNORMAL LOW (ref 22–32)
Calcium: 9.6 mg/dL (ref 8.9–10.3)
Chloride: 104 mmol/L (ref 98–111)
Creatinine, Ser: 1.11 mg/dL (ref 0.61–1.24)
GFR, Estimated: >60 mL/min (ref >60)
Glucose, Bld: 95 mg/dL (ref 70–99)
Potassium: 3.7 mmol/L (ref 3.5–5.1)
Sodium: 141 mmol/L (ref 135–145)
Total Bilirubin: 0.5 mg/dL (ref 0.3–1.2)
Total Protein: 7.2 g/dL (ref 6.5–8.1)

## 2020-07-14 LAB — CBC WITH DIFFERENTIAL/PLATELET
Abs Immature Granulocytes: 0.07 10*3/uL (ref 0.00–0.07)
Basophils Absolute: 0 10*3/uL (ref 0.0–0.1)
Basophils Relative: 0 %
Eosinophils Absolute: 0 10*3/uL (ref 0.0–0.5)
Eosinophils Relative: 0 %
HCT: 39.2 % (ref 39.0–52.0)
Hemoglobin: 12.9 g/dL — ABNORMAL LOW (ref 13.0–17.0)
Immature Granulocytes: 1 %
Lymphocytes Relative: 15 %
Lymphs Abs: 1.5 10*3/uL (ref 0.7–4.0)
MCH: 28.5 pg (ref 26.0–34.0)
MCHC: 32.9 g/dL (ref 30.0–36.0)
MCV: 86.7 fL (ref 80.0–100.0)
Monocytes Absolute: 1.4 10*3/uL — ABNORMAL HIGH (ref 0.1–1.0)
Monocytes Relative: 14 %
Neutro Abs: 6.9 10*3/uL (ref 1.7–7.7)
Neutrophils Relative %: 70 %
Platelets: 430 10*3/uL — ABNORMAL HIGH (ref 150–400)
RBC: 4.52 MIL/uL (ref 4.22–5.81)
RDW: 12.9 % (ref 11.5–15.5)
WBC: 9.9 10*3/uL (ref 4.0–10.5)
nRBC: 0 % (ref 0.0–0.2)

## 2020-07-14 LAB — AMMONIA: Ammonia: 37 umol/L — ABNORMAL HIGH (ref 9–35)

## 2020-07-14 MED ORDER — OLANZAPINE 5 MG PO TABS
5.0000 mg | ORAL_TABLET | Freq: Every day | ORAL | Status: DC
  Administered 2020-07-14 – 2020-07-15 (×2): 5 mg via ORAL
  Filled 2020-07-14 (×2): qty 1

## 2020-07-14 MED ORDER — ESCITALOPRAM OXALATE 10 MG PO TABS
10.0000 mg | ORAL_TABLET | Freq: Every day | ORAL | Status: DC
  Administered 2020-07-14 – 2020-07-19 (×6): 10 mg via ORAL
  Filled 2020-07-14 (×6): qty 1

## 2020-07-14 MED ORDER — BISACODYL 10 MG RE SUPP
10.0000 mg | Freq: Every day | RECTAL | Status: DC | PRN
  Administered 2020-07-14 – 2020-07-15 (×2): 10 mg via RECTAL
  Filled 2020-07-14 (×2): qty 1

## 2020-07-14 MED ORDER — BUSPIRONE HCL 10 MG PO TABS
10.0000 mg | ORAL_TABLET | Freq: Two times a day (BID) | ORAL | Status: DC
  Administered 2020-07-14 – 2020-07-20 (×12): 10 mg via ORAL
  Filled 2020-07-14 (×13): qty 1

## 2020-07-14 MED ORDER — PALIPERIDONE ER 3 MG PO TB24
3.0000 mg | ORAL_TABLET | Freq: Every day | ORAL | Status: DC
  Administered 2020-07-15 – 2020-07-16 (×2): 3 mg via ORAL
  Filled 2020-07-14 (×2): qty 1

## 2020-07-14 NOTE — Evaluation (Signed)
Clinical/Bedside Swallow Evaluation Patient Details  Name: Dylan Moon MRN: [id number] Date of Birth: 01-10-1996  Today's Date: 07/14/2020 Time: SLP Start Time (ACUTE ONLY): 4098 SLP Stop Time (ACUTE ONLY): 1009 SLP Time Calculation (min) (ACUTE ONLY): 17 min  Past Medical History:  Past Medical History:  Diagnosis Date  . ADHD   . Scoliosis   . Seizures (HCC)    most recent 12/02/17   Past Surgical History:  Past Surgical History:  Procedure Laterality Date  . NO PAST SURGERIES     HPI:  Pt is a 23 y/o male with history of seizures admitted on 10/9 in the setting of over 30 minutes of seizure activity. ETT 10/9-10/13; 10/19-10/21. Hospital course complicated by ileus and behavioral disturbances with agitation, visual hallucinations. 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 PMH includes: seizures, scoliosis, ADHD   Assessment / Plan / Recommendation Clinical Impression  Pt was seen for swallowing evaluation with medication administration, including liquids as well as pills crushed in puree. No additional POs given in light of ileus, although pt denies any nausea. Pt was alert and cooperative throughout testing and oriented x4. No overt signs of dysphagia nor aspiration noted. [name] risk at this point is likely his fluctuating mentation, but I think when he is alert and participatory like he has been so far this morning, he would be fine to begin oral intake when medically appropriate. I don't anticipate him needing any diet modifications with current mentation, but would hold POs when more altered. May have to monitor to make sure that he can get enough intake to support himself nutritionally (RD already following).   SLP Visit Diagnosis: Dysphagia, unspecified (R13.10)    Aspiration Risk  Mild aspiration risk    Diet Recommendation Regular;Thin liquid (when medically ready)   Liquid Administration via: Cup;Straw Medication Administration: Whole meds  with liquid (crush PRN given mentation) Supervision: Full supervision/cueing for compensatory strategies;Staff to assist with self feeding Postural Changes: Seated upright at 90 degrees    Other  Recommendations Oral Care Recommendations: Oral care BID   Follow up Recommendations  (tba)      Frequency and Duration min 2x/week  1 week       Prognosis Prognosis for Safe Diet Advancement: Good      Swallow Study   General HPI: Pt is a 24 y/o male with history of seizures admitted on 10/9 in the setting of over 30 minutes of seizure activity. ETT 10/9-10/13; 10/19-10/21. Hospital course complicated by ileus and behavioral disturbances with agitation, visual hallucinations. 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 PMH includes: seizures, scoliosis, ADHD Type of Study: Bedside Swallow Evaluation Previous Swallow Assessment: none in chart Diet Prior to this Study: NPO Temperature Spikes Noted: No Respiratory Status: Room air History of Recent Intubation: Yes Length of Intubations (days): 6 days (over two intubations) Date extubated: 07/13/20 Behavior/Cognition: Alert;Cooperative Oral Cavity Assessment: Within Functional Limits Oral Care Completed by SLP: Yes Oral Cavity - Dentition: Adequate natural dentition Self-Feeding Abilities:  (not attempted, in restraints) Patient Positioning: Upright in bed Baseline Vocal Quality: Low vocal intensity    Oral/Motor/Sensory Function     Ice Chips Ice chips: Not tested   Thin Liquid Thin Liquid: Within functional limits Presentation: Cup;Straw    Nectar Thick Nectar Thick Liquid: Not tested   Honey Thick Honey Thick Liquid: Not tested   Puree Puree: Within functional limits Presentation: Spoon   Solid     Solid: Not tested  Mahala Menghini., M.A. CCC-SLP Acute Rehabilitation Services Pager [phone] Office [phone]  07/14/2020,10:13 AM

## 2020-07-14 NOTE — Consult Note (Signed)
[facility] Face-to-Face Psychiatry Consult   Reason for Consult:  Delirium Referring Physician:  Dr Lake Bells Patient Identification: Dylan Moon MRN:  [id number] Principal Diagnosis: Seizures Diagnosis:  Active Problems:   Adjustment disorder with mixed disturbance of emotions and conduct   Status epilepticus ([place])   Total Time spent with patient: 1.5 hours  Subjective:   Dylan Moon is a 24 y.o. male patient admitted with seizure d/o.  Patient is observed to be lying in bed, restrained by waist belt and bilateral wrist restraints.  He is alert and oriented, and appears to have improved mentation however it is apparent he has poor insight into his current mental condition as well as seizure disorder.  Patient continues to ruminate on leaving the hospital, and wanting to call his mother so that he can leave.  Patient remains irritable and agitated, currently during this evaluation he is on Precedex at 70m.  Patient is also receiving Zyprexa 10 mg p.o. twice daily, Depacon 500 p.o. every 6, BuSpar 7.5 mg p.o. twice daily, Lexapro 5 mg p.o. nightly.  Patient has alprazolam 2 mg p.o. 3 times weekly as needed for anxiety, in addition to Haldol 5 mg every 6 as needed for agitation.  He has also received Geodon 10 mg IM x2 yesterday due to severe agitation.  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 "  This bed is for somebody else not me. "   HPI:  This is a 24year old with a history of seizure disorder and prior history of status who will began seizing this morning.  Apparently had 30 minutes of uninterrupted seizures before EMS arrived.  He was given benzodiazepines in transport but seizures did not resolve.  He was given further benzodiazepines on arrival in the department of emergency medicine however he subsequently became apneic and  required intubation.  Despite being loaded with valproic acid and started on propofol he is still intermittently having seizures being treated with boluses of Versed  Collateral obtained from his legal guardian (documentation not available although chart review shows existing guardianship).  As per mother she was going to guardianship and power of attorney for finances and health due to intractable seizures, brain damage, bipolar disorder, anxiety, and depression.  Mother patient has a longstanding psychiatric history that has been complicated due to his persistent seizure disorder.  She reports his seizures started in 2017, and have been intractable since.  She reports that patient has agreed to treatment, and will leave prior to procedure being completed.  She states he [facility] however he will not consent to this procedure. "  I am trying to have him being incompetent."  When assessing for developmental history mother admits to intrauterine drug use " I did get high back then.  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  She reports he met all of his developmental milestones.  She reports during school patient did struggle throughout grade school, and received a diagnosis of ADHD at the age of 89  She states he was unable to take most stimulant medication due to side effects of increased heart rate and increase in blood pressure.  Mother reports having psychological evaluation completed, remains unclear of his IQ  score however reports there was some concerns with his ability to progress as an adult.  She reports his IQ was pretty low.  Mother identifies multiple legal charges, states he gets in trouble every 2 weeks. "  He has two stalking charges, property destruction, or assault child's grandfather.  The court has ordered a psychological evaluation."  Mother reports irritability, mood  swings throughout the day, impulsivity, and poor impulse control.  She denies any history of suicide attempts in the past.  Mother did authorize consent for medication adjustments, to include possible recommendation for inpatient psychiatric admission potentially at the state level to help determine competency.  Did discuss with mother goal is to get patient psychiatrically stabilized, and receives services outpatient due to patient's ongoing seizure disorder that continues to complicate his overall level of care.     Past Psychiatric History: ADHD per patient.  Per mom patient has been diagnosed with bipolar, anxiety, depression.  It remains unclear if patient has a diagnosis of IDD, however she reports he was in special classes in grade school. "  They did use to pull him out of class and put him at other classes with other children."  Patient reports daily substance use of THC only, he denies any use of any synthetic substances. "  I used to smoke about 8 blunts a day however the seizures have been slow me down."   Risk to Self:  Yes Risk to Others:  none Prior Inpatient Therapy:  none Prior Outpatient Therapy:  Yes current evaluation at [facility].  Per chart review has also been evaluated at [facility] last visit March 08, 2020.  Per his chart review patient has been diagnosed with ADHD, personality disorder unspecified, adjustment disorder with mixed disturbances of emotions and conduct. Past Medical History:  Past Medical History:  Diagnosis Date  . ADHD   . Scoliosis   . Seizures ([place])    most recent 12/02/17    Past Surgical History:  Procedure Laterality Date  . NO PAST SURGERIES     Family History:  Family History  Problem Relation Age of Onset  . Diabetes Mother   . Hypertension Mother   . Cancer Other   . Diabetes Father   . Seizures Maternal Grandfather    Family Psychiatric  History: none Social History:  Social History   Substance and Sexual  Activity  Alcohol Use No     Social History   Substance and Sexual Activity  Drug Use Yes  . Types: Marijuana   Comment: Daily use of marijuana.    Social History   Socioeconomic History  . Marital status: Single    Spouse name: Not on file  . Number of children: 0  . Years of education: HS  . Highest education level: Not on file  Occupational History  . Occupation: Horticulturist, commercial  Tobacco Use  . Smoking status: Never Smoker  . Smokeless tobacco: Never Used  Vaping Use  . Vaping Use: Never used  Substance and Sexual Activity  . Alcohol use: No  . Drug use: Yes    Types: Marijuana    Comment: Daily use of marijuana.  . Sexual activity: Not Currently  Other Topics Concern  . Not on file  Social History Narrative   Lives at home his mother.   3-4 sodas per week.   Right-handed.   Social Determinants of Health   Financial Resource Strain:   . Difficulty of Paying Living Expenses: Not on  file  Food Insecurity:   . Worried About Charity fundraiser in the Last Year: Not on file  . Ran Out of Food in the Last Year: Not on file  Transportation Needs:   . Lack of Transportation (Medical): Not on file  . Lack of Transportation (Non-Medical): Not on file  Physical Activity:   . Days of Exercise per Week: Not on file  . Minutes of Exercise per Session: Not on file  Stress:   . Feeling of Stress : Not on file  Social Connections:   . Frequency of Communication with Friends and Family: Not on file  . Frequency of Social Gatherings with Friends and Family: Not on file  . Attends Religious Services: Not on file  . Active Member of Clubs or Organizations: Not on file  . Attends Archivist Meetings: Not on file  . Marital Status: Not on file   Additional Social History:    Allergies:   Allergies  Allergen Reactions  . Keppra [Levetiracetam] Other (See Comments)    Irritability   . Tramadol Other (See Comments)    Contraindicated with  current medications (??)  . Vimpat [Lacosamide] Other (See Comments)    Causes anger    Labs:  Results for orders placed or performed during the hospital encounter of 07/01/20 (from the past 48 hour(s))  Glucose, capillary     Status: None   Collection Time: 07/12/20  3:48 PM  Result Value Ref Range   Glucose-Capillary 78 70 - 99 mg/dL    Comment: Glucose reference range applies only to samples taken after fasting for at least 8 hours.  Glucose, capillary     Status: Abnormal   Collection Time: 07/12/20  8:02 PM  Result Value Ref Range   Glucose-Capillary 103 (H) 70 - 99 mg/dL    Comment: Glucose reference range applies only to samples taken after fasting for at least 8 hours.  Glucose, capillary     Status: Abnormal   Collection Time: 07/12/20 11:44 PM  Result Value Ref Range   Glucose-Capillary 103 (H) 70 - 99 mg/dL    Comment: Glucose reference range applies only to samples taken after fasting for at least 8 hours.  Magnesium     Status: None   Collection Time: 07/13/20  1:47 AM  Result Value Ref Range   Magnesium 1.8 1.7 - 2.4 mg/dL    Comment: Performed at [facility] [address], [place], [place] [address]  Phosphorus     Status: None   Collection Time: 07/13/20  1:47 AM  Result Value Ref Range   Phosphorus 4.4 2.5 - 4.6 mg/dL    Comment: Performed at [facility] [address], [place], [place] [address]  Comprehensive metabolic panel     Status: Abnormal   Collection Time: 07/13/20  1:47 AM  Result Value Ref Range   Sodium 137 135 - 145 mmol/L   Potassium 3.9 3.5 - 5.1 mmol/L   Chloride 105 98 - 111 mmol/L   CO2 22 22 - 32 mmol/L   Glucose, Bld 125 (H) 70 - 99 mg/dL    Comment: Glucose reference range applies only to samples taken after fasting for at least 8 hours.   BUN 17 6 - 20 mg/dL   Creatinine, Ser 0.99 0.61 - 1.24 mg/dL   Calcium 9.0 8.9 - 10.3 mg/dL   Total Protein 6.1 (L) 6.5 - 8.1 g/dL   Albumin 2.9 (L) 3.5 - 5.0 g/dL  AST 26 15 -  41 U/L   ALT 25 0 - 44 U/L   Alkaline Phosphatase 42 38 - 126 U/L   Total Bilirubin 0.3 0.3 - 1.2 mg/dL   GFR, Estimated >60 >60 mL/min   Anion gap 10 5 - 15    Comment: Performed at [facility] [address] [place] [address]  CBC with Differential/Platelet     Status: None   Collection Time: 07/13/20  1:47 AM  Result Value Ref Range   WBC 6.7 4.0 - 10.5 K/uL   RBC 4.79 4.22 - 5.81 MIL/uL   Hemoglobin 13.9 13.0 - 17.0 g/dL   HCT 41.6 39 - 52 %   MCV 86.8 80.0 - 100.0 fL   MCH 29.0 26.0 - 34.0 pg   MCHC 33.4 30.0 - 36.0 g/dL   RDW 12.8 11.5 - 15.5 %   Platelets 394 150 - 400 K/uL   nRBC 0.0 0.0 - 0.2 %   Neutrophils Relative % 57 %   Neutro Abs 3.8 1.7 - 7.7 K/uL   Lymphocytes Relative 28 %   Lymphs Abs 1.9 0.7 - 4.0 K/uL   Monocytes Relative 13 %   Monocytes Absolute 0.8 0.1 - 1.0 K/uL   Eosinophils Relative 1 %   Eosinophils Absolute 0.1 0.0 - 0.5 K/uL   Basophils Relative 0 %   Basophils Absolute 0.0 0.0 - 0.1 K/uL   Immature Granulocytes 1 %   Abs Immature Granulocytes 0.07 0.00 - 0.07 K/uL    Comment: Performed at [facility], [address], [place], [place] [address]  Triglycerides     Status: Abnormal   Collection Time: 07/13/20  1:47 AM  Result Value Ref Range   Triglycerides 209 (H) <150 mg/dL    Comment: Performed at [facility] [address] [place], [place] [address]  Glucose, capillary     Status: Abnormal   Collection Time: 07/13/20  3:49 AM  Result Value Ref Range   Glucose-Capillary 112 (H) 70 - 99 mg/dL    Comment: Glucose reference range applies only to samples taken after fasting for at least 8 hours.  Glucose, capillary     Status: Abnormal   Collection Time: 07/13/20  8:35 AM  Result Value Ref Range   Glucose-Capillary 109 (H) 70 - 99 mg/dL    Comment: Glucose reference range applies only to samples taken after fasting for at least 8 hours.  Glucose, capillary     Status: Abnormal   Collection Time: 07/13/20  12:12 PM  Result Value Ref Range   Glucose-Capillary 113 (H) 70 - 99 mg/dL    Comment: Glucose reference range applies only to samples taken after fasting for at least 8 hours.  Glucose, capillary     Status: None   Collection Time: 07/13/20  4:18 PM  Result Value Ref Range   Glucose-Capillary 95 70 - 99 mg/dL    Comment: Glucose reference range applies only to samples taken after fasting for at least 8 hours.  Glucose, capillary     Status: None   Collection Time: 07/13/20 11:35 PM  Result Value Ref Range   Glucose-Capillary 89 70 - 99 mg/dL    Comment: Glucose reference range applies only to samples taken after fasting for at least 8 hours.  Ammonia     Status: Abnormal   Collection Time: 07/14/20  3:55 AM  Result Value Ref Range   Ammonia 37 (H) 9 - 35 umol/L  Comment: Performed at [facility], [place] [address], [place], [place] [address]  Comprehensive metabolic panel     Status: Abnormal   Collection Time: 07/14/20  3:55 AM  Result Value Ref Range   Sodium 141 135 - 145 mmol/L   Potassium 3.7 3.5 - 5.1 mmol/L   Chloride 104 98 - 111 mmol/L   CO2 21 (L) 22 - 32 mmol/L   Glucose, Bld 95 70 - 99 mg/dL    Comment: Glucose reference range applies only to samples taken after fasting for at least 8 hours.   BUN 7 6 - 20 mg/dL   Creatinine, Ser 1.11 0.61 - 1.24 mg/dL   Calcium 9.6 8.9 - 10.3 mg/dL   Total Protein 7.2 6.5 - 8.1 g/dL   Albumin 3.7 3.5 - 5.0 g/dL   AST 55 (H) 15 - 41 U/L   ALT 28 0 - 44 U/L   Alkaline Phosphatase 43 38 - 126 U/L   Total Bilirubin 0.5 0.3 - 1.2 mg/dL   GFR, Estimated >60 >60 mL/min    Comment: (NOTE) Calculated using the CKD-EPI Creatinine Equation (2021)    Anion gap 16 (H) 5 - 15    Comment: Performed at [facility] [address] [place], [place] [address]  CBC with Differential/Platelet     Status: Abnormal   Collection Time: 07/14/20  3:55 AM  Result Value Ref Range   WBC 9.9 4.0 - 10.5 K/uL   RBC 4.52 4.22 - 5.81 MIL/uL    Hemoglobin 12.9 (L) 13.0 - 17.0 g/dL   HCT 39.2 39 - 52 %   MCV 86.7 80.0 - 100.0 fL   MCH 28.5 26.0 - 34.0 pg   MCHC 32.9 30.0 - 36.0 g/dL   RDW 12.9 11.5 - 15.5 %   Platelets 430 (H) 150 - 400 K/uL   nRBC 0.0 0.0 - 0.2 %   Neutrophils Relative % 70 %   Neutro Abs 6.9 1.7 - 7.7 K/uL   Lymphocytes Relative 15 %   Lymphs Abs 1.5 0.7 - 4.0 K/uL   Monocytes Relative 14 %   Monocytes Absolute 1.4 (H) 0.1 - 1.0 K/uL   Eosinophils Relative 0 %   Eosinophils Absolute 0.0 0.0 - 0.5 K/uL   Basophils Relative 0 %   Basophils Absolute 0.0 0.0 - 0.1 K/uL   Immature Granulocytes 1 %   Abs Immature Granulocytes 0.07 0.00 - 0.07 K/uL    Comment: Performed at [facility], [address] [place], [place] [address]  Glucose, capillary     Status: None   Collection Time: 07/14/20  8:25 AM  Result Value Ref Range   Glucose-Capillary 96 70 - 99 mg/dL    Comment: Glucose reference range applies only to samples taken after fasting for at least 8 hours.  Glucose, capillary     Status: None   Collection Time: 07/14/20 12:00 PM  Result Value Ref Range   Glucose-Capillary 96 70 - 99 mg/dL    Comment: Glucose reference range applies only to samples taken after fasting for at least 8 hours.    Current Facility-Administered Medications  Medication Dose Route Frequency Provider Last Rate Last Admin  . 0.9 %  sodium chloride infusion  250 mL Intravenous Continuous Juanito Doom, MD   Held at 07/11/20 1707  . acetaminophen (TYLENOL) 160 MG/5ML solution 650 mg  650 mg Oral Q6H PRN Juanito Doom, MD      . ALPRAZolam Duanne Moron)  tablet 2 mg  2 mg Oral TID PRN Juanito Doom, MD   2 mg at 07/14/20 1327  . atropine injection 1 mg  1 mg Intravenous Once PRN Simonne Maffucci B, MD      . bisacodyl (DULCOLAX) suppository 10 mg  10 mg Rectal Daily PRN Simonne Maffucci B, MD      . busPIRone (BUSPAR) tablet 7.5 mg  7.5 mg Oral BID Simonne Maffucci B, MD   7.5 mg at 07/14/20 0954  . chlorhexidine  gluconate (MEDLINE KIT) (PERIDEX) 0.12 % solution 15 mL  15 mL Mouth Rinse BID Simonne Maffucci B, MD   15 mL at 07/14/20 0958  . Chlorhexidine Gluconate Cloth 2 % PADS 6 each  6 each Topical Daily Sampson Goon, MD   6 each at 07/13/20 1550  . dexmedetomidine (PRECEDEX) 400 MCG/100ML (4 mcg/mL) infusion  0.4-2 mcg/kg/hr Intravenous Titrated Audria Nine, DO 17.74 mL/hr at 07/14/20 1200 0.8 mcg/kg/hr at 07/14/20 1200  . dextrose 5 %-0.45 % sodium chloride infusion   Intravenous Continuous Juanito Doom, MD   Paused at 07/13/20 1357  . docusate (COLACE) 50 MG/5ML liquid 100 mg  100 mg Oral BID PRN Simonne Maffucci B, MD      . enoxaparin (LOVENOX) injection 40 mg  40 mg Subcutaneous Q24H Sampson Goon, MD   40 mg at 07/13/20 1728  . escitalopram (LEXAPRO) tablet 5 mg  5 mg Oral QHS Simonne Maffucci B, MD   5 mg at 07/14/20 0033  . Eslicarbazepine Acetate TABS 1,600 mg  1,600 mg Oral Daily Simonne Maffucci B, MD   1,600 mg at 07/14/20 1117  . famotidine (PEPCID) tablet 20 mg  20 mg Oral BID Simonne Maffucci B, MD   20 mg at 07/14/20 0954  . haloperidol lactate (HALDOL) injection 5 mg  5 mg Intravenous Q6H PRN Juanito Doom, MD   5 mg at 07/14/20 1157  . lactated ringers infusion   Intravenous Continuous Collene Gobble, MD   Stopped at 07/12/20 1800  . lactulose (CHRONULAC) 10 GM/15ML solution 30 g  30 g Oral BID Simonne Maffucci B, MD   30 g at 07/14/20 0953  . OLANZapine (ZYPREXA) tablet 10 mg  10 mg Oral BID Simonne Maffucci B, MD   10 mg at 07/14/20 0955  . ondansetron (ZOFRAN) injection 4 mg  4 mg Intravenous Q6H PRN Anders Simmonds, MD   4 mg at 07/12/20 1056  . phenytoin (DILANTIN) injection 100 mg  100 mg Intravenous Q8H Lora Havens, MD   100 mg at 07/14/20 1411  . polyethylene glycol (MIRALAX / GLYCOLAX) packet 17 g  17 g Oral Daily PRN Sampson Goon, MD   17 g at 07/11/20 1001  . polyethylene glycol (MIRALAX / GLYCOLAX) packet 17 g  17 g Per Tube Daily Juanito Doom, MD   17 g at 07/14/20 0953  . sennosides (SENOKOT) 8.8 MG/5ML syrup 5 mL  5 mL Oral BID PRN Simonne Maffucci B, MD      . valproate (DEPACON) 500 mg in dextrose 5 % 50 mL IVPB  500 mg Intravenous Q6H Lora Havens, MD 55 mL/hr at 07/14/20 1203 500 mg at 07/14/20 1203    Musculoskeletal: Strength & Muscle Tone: increased Gait & Station: unsteady Patient leans: N/A  Psychiatric Specialty Exam: Physical Exam Vitals and nursing note reviewed.  HENT:     Head: Normocephalic.     Nose: Nose normal.  Pulmonary:  Effort: Pulmonary effort is normal.  Musculoskeletal:     Cervical back: Normal range of motion.  Neurological:     General: No focal deficit present.     Mental Status: He is alert.  Psychiatric:        Attention and Perception: He is inattentive.        Mood and Affect: Mood is anxious.        Speech: Speech is slurred.        Behavior: Behavior is agitated.        Thought Content: Thought content normal.        Cognition and Memory: Cognition normal.        Judgment: Judgment is impulsive.     Review of Systems  Psychiatric/Behavioral: Positive for agitation and behavioral problems. The patient is nervous/anxious.   All other systems reviewed and are negative.   Blood pressure 129/87, pulse 93, temperature 97.8 F (36.6 C), temperature source Oral, resp. rate (!) 28, height 6' (1.829 m), weight 87 kg, SpO2 95 %.Body mass index is 26.01 kg/m.  General Appearance: Disheveled  Eye Contact:  Fair  Speech:  Slurred  Volume:  Normal  Mood:  Anxious and Irritable  Affect:  Congruent  Thought Process:  Coherent  Orientation:  Other:  person and place  Thought Content:  Obsessions  Suicidal Thoughts:  No  Homicidal Thoughts:  No  Memory:  Immediate;   Fair Recent;   Fair Remote;   Fair  Judgement:  Poor  Insight:  Lacking  Psychomotor Activity:  Increased  Concentration:  Concentration: Fair and Attention Span: Fair  Recall:  [employer] of  Knowledge:  Fair  Language:  Fair  Akathisia:  No  Handed:  Right  AIMS (if indicated):     Assets:  Housing Leisure Time Resilience Social Support  ADL's:  Intact  Cognition:  WNL  Sleep:      Patient is a 24 year old male with poorly controlled status epilepticus, ADHD, substance use disorder, bipolar.  With history of behavioral issues, that remains unclear if this is underlying psychosis versus a personality disorder.  As noted above mother reports low IQ, however unable to provide for score and or actual diagnosis of IDD.  Patient continues to exhibit psychosis at times, has been in the hospital for almost 14 days and has yet to return to his baseline.  Per chart review he has multiple visits with psychiatry, emergency room visits, and evaluations with neurology for the problems listed above.  Patient will benefit from formal psychiatric evaluation but this continues to remain challenging, due to his seizure disorder and current state of agitation which has required IV sedation and restraints.  Patient denies suicidal ideations at this time.  Treatment Plan Summary: Adjustment disorder with mixed disturbance of emotions and conduct: -Patient has been on olanzapine 10 mg p.o. twice daily, with some improvement however due to his age likely diagnosis of substance use disorder psychosis and or schizophreniform will transition to long-acting medication to help with overall compliance.  Mother reports patient continues to refuse medication, therefore long-acting injectable will be the best option. -We will slowly transition from olanzapine to paliperidone.  We will decrease olanzapine to 5 mg p.o. nightly.  We will initiate paliperidone 3 mg p.o. daily. (We will slowly adjust and cross taper medication as patient has previously been nave to anti psychotropics).  -As noted patient remains on IV sedation Precedex, will need daily evaluation prior to making final disposition.  Anxiety: -Increase  Lexapro 5 mg daily -Increase Buspar 7.5 mg BID -Continue Xanax 2 mg TID PRN  Disposition: Patient does appear to be a risk to himself, if patient attempts to leave and will need to be placed under IVC.  We will continue with daily evaluation to monitor for symptom improvement. Goal is to follow up with outpatient psychiatry and neruology for further work up. Unable to make final disposition as patient remains under chemical sedation at this time.   Suella Broad, FNP 07/14/2020 2:21 PM

## 2020-07-14 NOTE — Progress Notes (Signed)
Patient stating that "people have been coming out of the ceiling" and looking up.

## 2020-07-14 NOTE — Evaluation (Signed)
Physical Therapy Evaluation Patient Details Name: Dylan Moon MRN: [id number] DOB: 09-10-1996 Today's Date: 07/14/2020   History of Present Illness  24 y.o male presenting with seizure that had 30 min of uninterrupted seizyre that was not resolved with benzodiazepines . He was given further benzodiazepines on arrival in the department of emergency medicine however he subsequently became apneic and required intubation. Having extreme agitation along course. PMH includes seizure history.  Clinical Impression   Pt presents with body-wide deconditioning, impaired sitting and standing balance, AMS, and decreased activity tolerance vs baseline. Pt to benefit from acute PT to address deficits. Pt ambulated around unit x2, overall requiring min +2 assist for mobility today. PT to progress mobility as tolerated, and will continue to follow acutely.      Follow Up Recommendations Outpatient PT;Other (comment)    Equipment Recommendations  None recommended by PT    Recommendations for Other Services       Precautions / Restrictions Precautions Precautions: Fall Precaution Comments: 5 point restraints Restrictions Weight Bearing Restrictions: No      Mobility  Bed Mobility Overal bed mobility: Needs Assistance Bed Mobility: Supine to Sit;Sit to Supine     Supine to sit: Min guard Sit to supine: Min guard   General bed mobility comments: cues for safety due to mild impulsivity    Transfers Overall transfer level: Needs assistance Equipment used: 2 person hand held assist;1 person hand held assist Transfers: Sit to/from Stand Sit to Stand: Min assist         General transfer comment: intermittent min A to maintain balance, for initial power up. +2 used initailly, then pt able to to progress with +1 HHA  Ambulation/Gait Ambulation/Gait assistance: Min assist;+2 safety/equipment Gait Distance (Feet): 700 Feet Assistive device: 1 person hand held assist;2 person hand held  assist Gait Pattern/deviations: Step-through pattern;Decreased stride length;Trunk flexed Gait velocity: decr   General Gait Details: Min assist to steady, correct balance deviations with directional changes/head turns.  Stairs            Wheelchair Mobility    Modified Rankin (Stroke Patients Only)       Balance Overall balance assessment: Needs assistance Sitting-balance support: No upper extremity supported;Feet supported Sitting balance-Leahy Scale: Fair     Standing balance support: Single extremity supported;During functional activity Standing balance-Leahy Scale: Fair Standing balance comment: overall fair balance, intermittent min assist to correct R lateral and posterior leaning                             Pertinent Vitals/Pain Pain Assessment: Faces Faces Pain Scale: No hurt Pain Intervention(s): Limited activity within patient's tolerance;Monitored during session    Home Living Family/patient expects to be discharged to:: Private residence Living Arrangements: Parent Available Help at Discharge: Family;Available PRN/intermittently Type of Home: House Home Access: Level entry     Home Layout: One level Home Equipment: None      Prior Function Level of Independence: Independent         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, 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     Hand Dominance   Dominant Hand: Right    Extremity/Trunk Assessment   Upper Extremity Assessment Upper Extremity Assessment: Generalized weakness    Lower Extremity Assessment Lower Extremity Assessment: Generalized weakness    Cervical / Trunk Assessment Cervical / Trunk Assessment: Normal  Communication   Communication: No difficulties  Cognition Arousal/Alertness: Awake/alert Behavior  During Therapy: WFL for tasks assessed/performed Overall Cognitive Status: No family/caregiver present to determine baseline cognitive functioning Area of  Impairment: Problem solving                             Problem Solving: Slow processing;Requires verbal cues General Comments: noted higher level executive functioning deficits and delay with processing multistep commands or non familiar questions      General Comments General comments (skin integrity, edema, etc.): HRmax 133 bpm    Exercises     Assessment/Plan    PT Assessment Patient needs continued PT services  PT Problem List Decreased strength;Decreased mobility;Decreased activity tolerance;Decreased balance;Decreased safety awareness;Decreased cognition       PT Treatment Interventions DME instruction;Therapeutic activities;Gait training;Therapeutic exercise;Patient/family education;Balance training;Functional mobility training;Neuromuscular re-education    PT Goals (Current goals can be found in the Care Plan section)  Acute Rehab PT Goals Patient Stated Goal: return home soon PT Goal Formulation: With patient Time For Goal Achievement: 07/28/20 Potential to Achieve Goals: Good    Frequency Min 3X/week   Barriers to discharge        Co-evaluation PT/OT/SLP Co-Evaluation/Treatment: Yes Reason for Co-Treatment: For patient/therapist safety;To address functional/ADL transfers;Necessary to address cognition/behavior during functional activity           AM-PAC PT "6 Clicks" Mobility  Outcome Measure Help needed turning from your back to your side while in a flat bed without using bedrails?: A Little Help needed moving from lying on your back to sitting on the side of a flat bed without using bedrails?: A Little Help needed moving to and from a bed to a chair (including a wheelchair)?: A Little Help needed standing up from a chair using your arms (e.g., wheelchair or bedside chair)?: A Little Help needed to walk in hospital room?: A Little Help needed climbing 3-5 steps with a railing? : A Lot 6 Click Score: 17    End of Session Equipment Utilized  During Treatment: Gait belt Activity Tolerance: Patient tolerated treatment well Patient left: in bed;with call bell/phone within reach;with restraints reapplied;with bed alarm set (posey belt, bilateral wrist restraints) Nurse Communication: Mobility status PT Visit Diagnosis: Other abnormalities of gait and mobility (R26.89);Difficulty in walking, not elsewhere classified (R26.2)    Time: 8182-9937 PT Time Calculation (min) (ACUTE ONLY): 35 min   Charges:   PT Evaluation $PT Eval Low Complexity: 1 Low          Mayia Megill E, PT Acute Rehabilitation Services Pager [phone]  Office [phone]   Elliot Simoneaux D Despina Hidden 07/14/2020, 4:49 PM

## 2020-07-14 NOTE — Evaluation (Signed)
Occupational Therapy Evaluation Patient Details Name: Dylan Moon MRN: [id number] DOB: 1995-12-29 Today's Date: 07/14/2020    History of Present Illness 24 y.o male presenting with seizure that had 30 min of uninterrupted seizyre that was not resolved with benzodiazepines . He was given further benzodiazepines on arrival in the department of emergency medicine however he subsequently became apneic and required intubation. Having extreme agitation along course. PMH includes seizure history.   Clinical Impression   PTA pt living with mother and functioning at independent community level. Pt does have past seizure history, admitted ~6 months prior with seizure as well. Pt had agitated course this admission, but was pleasant and cooperative with therapies. At time of eval, pt able to complete bed mobility at min guard level and sit <> stands with min A for R posterior lateral lean. Pt able to complete mobility into bathroom with 2 person HHA at min A level to stand and brush teeth. He endorses generalized fatigue, requiring one rest break. He was then able to mobilize in hallway a community distance with intermittent min A and +2 for equipment. Pt showing higher level balance deficits when challenged. Also noted higher level executive functioning deficits throughout evaluation as well. OT recommends follow up with OP neuro OT. Will continue to follow per POC listed below.    Follow Up Recommendations  Outpatient OT;Other (comment) (neuro)    Equipment Recommendations  None recommended by OT    Recommendations for Other Services       Precautions / Restrictions Precautions Precautions: Fall Precaution Comments: 5 point restraints Restrictions Weight Bearing Restrictions: No      Mobility Bed Mobility Overal bed mobility: Needs Assistance Bed Mobility: Supine to Sit;Sit to Supine     Supine to sit: Min guard Sit to supine: Min guard   General bed mobility comments: gues for  safety due to mild impulsivity    Transfers Overall transfer level: Needs assistance Equipment used: 2 person hand held assist;1 person hand held assist Transfers: Sit to/from Stand Sit to Stand: Min assist         General transfer comment: intermittent min A to maintain balance. +2 used initailly, then pt able to to progress with +1 HHA    Balance Overall balance assessment: Needs assistance Sitting-balance support: No upper extremity supported;Feet supported Sitting balance-Leahy Scale: Fair     Standing balance support: Single extremity supported;Bilateral upper extremity supported;During functional activity Standing balance-Leahy Scale: Poor Standing balance comment: pt slowly progressing to fair balance; intermittent min A still needed due to R lateral lean                           ADL either performed or assessed with clinical judgement   ADL Overall ADL's : Needs assistance/impaired Eating/Feeding: Set up;Sitting   Grooming: Min guard;Minimal assistance;Standing;Oral care Grooming Details (indicate cue type and reason): intermittent min A to maintain standing balance due to R lateral lean Upper Body Bathing: Set up;Sitting   Lower Body Bathing: Minimal assistance;Sit to/from stand;Sitting/lateral leans   Upper Body Dressing : Set up;Sitting   Lower Body Dressing: Minimal assistance;Sit to/from stand;Sitting/lateral leans   Toilet Transfer: Minimal assistance;Ambulation;Grab bars   Toileting- Clothing Manipulation and Hygiene: Set up;Sitting/lateral lean;Sit to/from stand       Functional mobility during ADLs: Minimal assistance;Cueing for safety       Vision Patient Visual Report: No change from baseline Vision Assessment?: No apparent visual deficits  Perception     Praxis      Pertinent Vitals/Pain Pain Assessment: Faces Faces Pain Scale: No hurt     Hand Dominance     Extremity/Trunk Assessment Upper Extremity  Assessment Upper Extremity Assessment: Generalized weakness   Lower Extremity Assessment Lower Extremity Assessment: Generalized weakness       Communication Communication Communication: No difficulties   Cognition Arousal/Alertness: Awake/alert Behavior During Therapy: WFL for tasks assessed/performed Overall Cognitive Status: No family/caregiver present to determine baseline cognitive functioning Area of Impairment: Problem solving                             Problem Solving: Slow processing;Requires verbal cues General Comments: noted higher level executive functioning deficits and delay with processing multistep commands or non familiar questions   General Comments       Exercises     Shoulder Instructions      Home Living Family/patient expects to be discharged to:: Private residence Living Arrangements: Parent Available Help at Discharge: Family;Available PRN/intermittently Type of Home: House Home Access: Level entry     Home Layout: One level     Bathroom Shower/Tub: Chief Strategy Officer: Standard     Home Equipment: None          Prior Functioning/Environment Level of Independence: Independent        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, 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        OT Problem List: Decreased strength;Decreased knowledge of use of DME or AE;Decreased knowledge of precautions;Decreased activity tolerance;Impaired balance (sitting and/or standing);Decreased safety awareness      OT Treatment/Interventions: Self-care/ADL training;Therapeutic exercise;Patient/family education;Balance training;Therapeutic activities;DME and/or AE instruction    OT Goals(Current goals can be found in the care plan section) Acute Rehab OT Goals Patient Stated Goal: return home soon OT Goal Formulation: With patient Time For Goal Achievement: 07/28/20 Potential to Achieve Goals: Good  OT Frequency: Min  2X/week   Barriers to D/C:            Co-evaluation              AM-PAC OT "6 Clicks" Daily Activity     Outcome Measure Help from another person eating meals?: None Help from another person taking care of personal grooming?: A Little Help from another person toileting, which includes using toliet, bedpan, or urinal?: A Little Help from another person bathing (including washing, rinsing, drying)?: A Little Help from another person to put on and taking off regular upper body clothing?: None Help from another person to put on and taking off regular lower body clothing?: A Little 6 Click Score: 20   End of Session Nurse Communication: Mobility status  Activity Tolerance: Patient tolerated treatment well Patient left: in bed;with call bell/phone within reach;with restraints reapplied (wrist and belt restraing only per RN)  OT Visit Diagnosis: Other abnormalities of gait and mobility (R26.89);Other symptoms and signs involving the nervous system (R29.898)                Time: 6861-6837 OT Time Calculation (min): 36 min Charges:  OT General Charges $OT Visit: 1 Visit OT Evaluation $OT Eval Moderate Complexity: 1 Mod  Dalphine Handing, MSOT, OTR/L Acute Rehabilitation Services [facility] Office Number: [phone] Pager: [phone]  Dalphine Handing 07/14/2020, 1:14 PM

## 2020-07-14 NOTE — Progress Notes (Signed)
NAME:  Dylan Moon, MRN:  [id number], DOB:  22-Aug-1996, LOS: 13 ADMISSION DATE:  07/01/2020, CONSULTATION DATE:  10/9 REFERRING MD:  ED, CHIEF COMPLAINT:  Seizure   Brief History   24 y/o male with history of seizures admitted on 10/9 in the setting of over 30 minutes of seizure activity.  Extubated 10/13.  Past Medical History  Epilepsy Anxiety ADHD  Significant Hospital Events   Intubated in ER 10/9 Extubated 10/13 Intubated 10/19 Extubated 10/21  Consults:  Neuro Psychiatry  Procedures:    Significant Diagnostic Tests:  CT Head 10/9 >> Negative for intracranial abnormalities   cEEG 10/9 - 10/10 >> Negative for seizures or epileptiform discharges. Severe diffuse encephalopathy.  10/18 valproate level elevated and ammonia elevated  10/20 MRI brain > negative for acute intracranial abnormality, but positive for a small left-side subependymal gray matter heterotopia, stable since 2018.  No evidence of mesial temporal sclerosis, fluid in pharynx, mastoid, paranasal sinuses  10/20 LP > no significant WBC or RBC  10/20 CT abdomen/pelvis > colonic ileus, RLL infiltrate  Micro Data:  10/9 sars: negative 10/10 blood: ngtd  Antimicrobials:  Cefepime 10/14 >> 10/14 Zosyn 10/14 >> 10/20  Interim history/subjective:   Pulled out IV overnight Started on geodon yesterday for severe agitation Remains on precedex Psychiatry to see him again today  Objective   Blood pressure (!) 105/51, pulse 78, temperature 97.8 F (36.6 C), temperature source Oral, resp. rate 17, height 6' (1.829 m), weight 87 kg, SpO2 95 %.        Intake/Output Summary (Last 24 hours) at 07/14/2020 0847 Last data filed at 07/14/2020 0600 Gross per 24 hour  Intake 1614.93 ml  Output 2300 ml  Net -685.07 ml   Filed Weights   07/11/20 0403 07/12/20 0500 07/13/20 0500  Weight: 87.6 kg 87.5 kg 87 kg    Examination:  General:  Resting comfortably in bed HENT: NCAT OP clear PULM: CTA B,  normal effort CV: RRR, no mgr GI: BS+, soft, nontender MSK: normal bulk and tone Neuro: asleep    Resolved Hospital Problem list     Assessment & Plan:  Status epilepticus > no further seizures while on medication Behavioral disturbance> concern for new schizophrenia given visual hallucinations and severe agitation Hyperammonemia improved, commonly seen in patients on depakote without symptoms, do no think this is at play 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 > Psychiatry consult  Acute respiratory failure due to inability to protect airway in setting of needing procedures on 10/19; resolved > aspiration precautions  Right lower lobe pneumonia > monitor off antibiotics  Colonic ileus > bowel regimen to continue > add dulcolax suppository  Best practice:  Diet: regular Pain/Anxiety/Delirium protocol (if indicated): n/a VAP protocol (if indicated): n/a DVT prophylaxis: lovenox GI prophylaxis: famotidine Glucose control: SSI Mobility: bed rest Code Status: full Family Communication: Will update mother later today Disposition:   Labs   CBC: Recent Labs  Lab 07/08/20 0517 07/11/20 0431 07/12/20 0955 07/13/20 0147 07/14/20 0355  WBC 5.7 5.2 8.3 6.7 9.9  NEUTROABS  --  2.2 4.9 3.8 6.9  HGB 13.7 15.5 14.2 13.9 12.9*  HCT 41.7 46.3 42.5 41.6 39.2  MCV 86.0 86.1 88.5 86.8 86.7  PLT 282 379 357 394 430*    Basic Metabolic Panel: Recent Labs  Lab 07/08/20 0517 07/11/20  3016 07/11/20 1535 07/12/20 0955 07/13/20 0147 07/14/20 0355  NA 136 137  --  138 137 141  K 4.2 4.8  --  4.7 3.9 3.7  CL 104 105  --  104 105 104  CO2 23 23  --  21* 22 21*  GLUCOSE 133* 101*  --  87 125* 95  BUN 17 19  --  23* 17 7  CREATININE 0.89 1.14  --  1.14 0.99 1.11  CALCIUM 9.2 9.2  --  9.3 9.0 9.6    MG 2.1  --  2.1 2.2 1.8  --   PHOS 4.2  --  4.7* 4.8* 4.4  --    GFR: Estimated Creatinine Clearance: 111.7 mL/min (by C-G formula based on SCr of 1.11 mg/dL). Recent Labs  Lab 07/08/20 0517 07/08/20 0517 07/11/20 0431 07/12/20 0955 07/13/20 0147 07/14/20 0355  PROCALCITON <0.10  --   --   --   --   --   WBC 5.7   < > 5.2 8.3 6.7 9.9   < > = values in this interval not displayed.    Liver Function Tests: Recent Labs  Lab 07/11/20 0431 07/12/20 0955 07/13/20 0147 07/14/20 0355  AST 36 38 26 55*  ALT 25 31 25 28   ALKPHOS 38 39 42 43  BILITOT 0.5 0.3 0.3 0.5  PROT 6.4* 6.9 6.1* 7.2  ALBUMIN 3.2* 3.2* 2.9* 3.7   No results for input(s): LIPASE, AMYLASE in the last 168 hours. Recent Labs  Lab 07/10/20 1155 07/12/20 0955 07/14/20 0355  AMMONIA 60* 44* 37*    ABG    Component Value Date/Time   PHART 7.414 07/11/2020 1650   PCO2ART 36.4 07/11/2020 1650   PO2ART 87.3 07/11/2020 1650   HCO3 22.8 07/11/2020 1650   TCO2 26 07/03/2020 0319   ACIDBASEDEF 1.1 07/11/2020 1650   O2SAT 96.1 07/11/2020 1650     Coagulation Profile: No results for input(s): INR, PROTIME in the last 168 hours.  Cardiac Enzymes: No results for input(s): CKTOTAL, CKMB, CKMBINDEX, TROPONINI in the last 168 hours.  HbA1C: Hgb A1c MFr Bld  Date/Time Value Ref Range Status  12/24/2019 05:13 PM 5.8 (H) 4.8 - 5.6 % Final    Comment:    (NOTE) Pre diabetes:          5.7%-6.4% Diabetes:              >6.4% Glycemic control for   <7.0% adults with diabetes     CBG: Recent Labs  Lab 07/13/20 0835 07/13/20 1212 07/13/20 1618 07/13/20 2335 07/14/20 0825  GLUCAP 109* 113* 95 89 96     Critical care time:  31 minutes     07/16/20, MD [place] [place] [facility] Pager: [phone] Cell: [phone] If no response, call [phone]

## 2020-07-15 DIAGNOSIS — K567 Ileus, unspecified: Secondary | ICD-10-CM | POA: Diagnosis not present

## 2020-07-15 DIAGNOSIS — G40909 Epilepsy, unspecified, not intractable, without status epilepticus: Secondary | ICD-10-CM | POA: Diagnosis not present

## 2020-07-15 DIAGNOSIS — R001 Bradycardia, unspecified: Secondary | ICD-10-CM | POA: Diagnosis not present

## 2020-07-15 DIAGNOSIS — F4325 Adjustment disorder with mixed disturbance of emotions and conduct: Secondary | ICD-10-CM | POA: Diagnosis not present

## 2020-07-15 LAB — COMPREHENSIVE METABOLIC PANEL
ALT: 26 U/L (ref 0–44)
AST: 37 U/L (ref 15–41)
Albumin: 3.1 g/dL — ABNORMAL LOW (ref 3.5–5.0)
Alkaline Phosphatase: 38 U/L (ref 38–126)
Anion gap: 10 (ref 5–15)
BUN: 10 mg/dL (ref 6–20)
CO2: 24 mmol/L (ref 22–32)
Calcium: 9.5 mg/dL (ref 8.9–10.3)
Chloride: 104 mmol/L (ref 98–111)
Creatinine, Ser: 1.02 mg/dL (ref 0.61–1.24)
GFR, Estimated: >60 mL/min (ref >60)
Glucose, Bld: 109 mg/dL — ABNORMAL HIGH (ref 70–99)
Potassium: 3.9 mmol/L (ref 3.5–5.1)
Sodium: 138 mmol/L (ref 135–145)
Total Bilirubin: 0.5 mg/dL (ref 0.3–1.2)
Total Protein: 6.6 g/dL (ref 6.5–8.1)

## 2020-07-15 LAB — CBC WITH DIFFERENTIAL/PLATELET
Abs Immature Granulocytes: 0.03 10*3/uL (ref 0.00–0.07)
Basophils Absolute: 0 10*3/uL (ref 0.0–0.1)
Basophils Relative: 1 %
Eosinophils Absolute: 0.1 10*3/uL (ref 0.0–0.5)
Eosinophils Relative: 1 %
HCT: 41 % (ref 39.0–52.0)
Hemoglobin: 13.7 g/dL (ref 13.0–17.0)
Immature Granulocytes: 1 %
Lymphocytes Relative: 46 %
Lymphs Abs: 2.4 10*3/uL (ref 0.7–4.0)
MCH: 29.8 pg (ref 26.0–34.0)
MCHC: 33.4 g/dL (ref 30.0–36.0)
MCV: 89.3 fL (ref 80.0–100.0)
Monocytes Absolute: 0.6 10*3/uL (ref 0.1–1.0)
Monocytes Relative: 11 %
Neutro Abs: 2.1 10*3/uL (ref 1.7–7.7)
Neutrophils Relative %: 40 %
Platelets: 409 10*3/uL — ABNORMAL HIGH (ref 150–400)
RBC: 4.59 MIL/uL (ref 4.22–5.81)
RDW: 13.4 % (ref 11.5–15.5)
WBC: 5.2 10*3/uL (ref 4.0–10.5)
nRBC: 0 % (ref 0.0–0.2)

## 2020-07-15 LAB — CSF CULTURE W GRAM STAIN: Culture: NO GROWTH

## 2020-07-15 LAB — GLUCOSE, CAPILLARY: Glucose-Capillary: 106 mg/dL — ABNORMAL HIGH (ref 70–99)

## 2020-07-15 MED ORDER — LOSARTAN POTASSIUM 50 MG PO TABS
25.0000 mg | ORAL_TABLET | Freq: Every day | ORAL | Status: DC
  Administered 2020-07-15 – 2020-07-16 (×2): 25 mg via ORAL
  Filled 2020-07-15 (×2): qty 1

## 2020-07-15 MED ORDER — CHLORHEXIDINE GLUCONATE 0.12 % MT SOLN
15.0000 mL | Freq: Two times a day (BID) | OROMUCOSAL | Status: DC
  Administered 2020-07-15 – 2020-07-19 (×10): 15 mL via OROMUCOSAL
  Filled 2020-07-15 (×7): qty 15

## 2020-07-15 MED ORDER — ORAL CARE MOUTH RINSE
15.0000 mL | Freq: Two times a day (BID) | OROMUCOSAL | Status: DC
  Administered 2020-07-15 – 2020-07-19 (×8): 15 mL via OROMUCOSAL

## 2020-07-15 NOTE — Progress Notes (Signed)
NAME:  Dylan Moon, MRN:  [id number], DOB:  September 15, 1996, LOS: 14 ADMISSION DATE:  07/01/2020, CONSULTATION DATE:  10/9 REFERRING MD:  ED, CHIEF COMPLAINT:  Seizure   Brief History   24 y/o male with history of seizures admitted on 10/9 in the setting of over 30 minutes of seizure activity.  Extubated 10/13.  Past Medical History  Epilepsy Anxiety ADHD  Significant Hospital Events   Intubated in ER 10/9 Extubated 10/13 Intubated 10/19 Extubated 10/21  Consults:  Neuro Psychiatry  Procedures:    Significant Diagnostic Tests:  CT Head 10/9 >> Negative for intracranial abnormalities   cEEG 10/9 - 10/10 >> Negative for seizures or epileptiform discharges. Severe diffuse encephalopathy.  10/18 valproate level elevated and ammonia elevated  10/20 MRI brain > negative for acute intracranial abnormality, but positive for a small left-side subependymal gray matter heterotopia, stable since 2018.  No evidence of mesial temporal sclerosis, fluid in pharynx, mastoid, paranasal sinuses  10/20 LP > no significant WBC or RBC  10/20 CT abdomen/pelvis > colonic ileus, RLL infiltrate  Micro Data:  10/9 sars: negative 10/10 blood: ngtd  Antimicrobials:  Cefepime 10/14 >> 10/14 Zosyn 10/14 >> 10/20  Interim history/subjective:   Off precedex this morning. He has not been eating much due to not liking hospital food, but would eat something if brought from home. Small BM yesterday with increased bowel reg. Walked 2 laps with PT yesterday.  Objective   Blood pressure (!) 103/58, pulse (!) 54, temperature 97.8 F (36.6 C), temperature source Oral, resp. rate 20, height 6' (1.829 m), weight 87 kg, SpO2 99 %.        Intake/Output Summary (Last 24 hours) at 07/15/2020 0735 Last data filed at 07/15/2020 0700 Gross per 24 hour  Intake 1172.96 ml  Output 700 ml  Net 472.96 ml   Filed Weights   07/11/20 0403 07/12/20 0500 07/13/20 0500  Weight: 87.6 kg 87.5 kg 87 kg     Examination:  General: Young man lying in bed no acute distress in restraints HENT: [place]/AT, eyes anicteric PULM: Breathing comfortably on room air, no conversational dyspnea.  Clear to auscultation bilaterally. CV: Regular rate and rhythm, no murmurs GI: Soft, nontender, nondistended MSK: Appropriate muscle mass for age, no clubbing or cyanosis Neuro: Falls back asleep easily, moving all extremities spontaneously and on command.  Answering questions appropriately.    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  Continue Xanax as needed. > haldol prn; should be used before Precedex is turned back on > precedex only for severe agitation not controlled with other as needed's > Continue eslicarbazepine, valproate > Psychiatry consult; appreciate their assistance. IVC'ed and likely will require inpatient treatment.  Acute respiratory failure due to inability to protect airway in setting of needing procedures on 10/19; resolved > aspiration precautions and encourage good pulmonary hygiene  Right lower lobe pneumonia, resolved > monitor off antibiotics  Colonic ileus > bowel regimen to continue > add dulcolax suppository > Liberalize diet.  Requested physical therapy to work with him again today to help with out of bed mobility. -  enema if no BM after breakfast today -d/c maintenance fluids when PO intake improves  Bradycardia due to precedex -wean precedex   Best practice:  Diet:  regular Pain/Anxiety/Delirium protocol (if indicated): n/a VAP protocol (if indicated): n/a DVT prophylaxis: lovenox GI prophylaxis: famotidine Glucose control: SSI Mobility: bed rest Code Status: full Family Communication: Will update mother later today Disposition:   Labs   CBC: Recent Labs  Lab 07/11/20 0431 07/12/20 0955 07/13/20 0147 07/14/20 0355  WBC 5.2 8.3 6.7 9.9  NEUTROABS 2.2 4.9 3.8 6.9  HGB 15.5 14.2 13.9 12.9*  HCT 46.3 42.5 41.6 39.2  MCV 86.1 88.5 86.8 86.7  PLT 379 357 394 430*    Basic Metabolic Panel: Recent Labs  Lab 07/11/20 0431 07/11/20 1535 07/12/20 0955 07/13/20 0147 07/14/20 0355  NA 137  --  138 137 141  K 4.8  --  4.7 3.9 3.7  CL 105  --  104 105 104  CO2 23  --  21* 22 21*  GLUCOSE 101*  --  87 125* 95  BUN 19  --  23* 17 7  CREATININE 1.14  --  1.14 0.99 1.11  CALCIUM 9.2  --  9.3 9.0 9.6  MG  --  2.1 2.2 1.8  --   PHOS  --  4.7* 4.8* 4.4  --    GFR: Estimated Creatinine Clearance: 111.7 mL/min (by C-G formula based on SCr of 1.11 mg/dL). Recent Labs  Lab 07/11/20 0431 07/12/20 0955 07/13/20 0147 07/14/20 0355  WBC 5.2 8.3 6.7 9.9    Liver Function Tests: Recent Labs  Lab 07/11/20 0431 07/12/20 0955 07/13/20 0147 07/14/20 0355  AST 36 38 26 55*  ALT 25 31 25 28   ALKPHOS 38 39 42 43  BILITOT 0.5 0.3 0.3 0.5  PROT 6.4* 6.9 6.1* 7.2  ALBUMIN 3.2* 3.2* 2.9* 3.7   No results for input(s): LIPASE, AMYLASE in the last 168 hours. Recent Labs  Lab 07/10/20 1155 07/12/20 0955 07/14/20 0355  AMMONIA 60* 44* 37*    ABG    Component Value Date/Time   PHART 7.414 07/11/2020 1650   PCO2ART 36.4 07/11/2020 1650   PO2ART 87.3 07/11/2020 1650   HCO3 22.8 07/11/2020 1650   TCO2 26 07/03/2020 0319   ACIDBASEDEF 1.1 07/11/2020 1650   O2SAT 96.1 07/11/2020 1650     Coagulation Profile: No results for input(s): INR, PROTIME in the last 168 hours.  Cardiac Enzymes: No results for input(s): CKTOTAL, CKMB, CKMBINDEX,  TROPONINI in the last 168 hours.  HbA1C: Hgb A1c MFr Bld  Date/Time Value Ref Range Status  12/24/2019 05:13 PM 5.8 (H) 4.8 - 5.6 % Final    Comment:    (NOTE) Pre diabetes:          5.7%-6.4% Diabetes:              >6.4% Glycemic control for   <7.0% adults with diabetes     CBG: Recent Labs  Lab 07/14/20 1200 07/14/20 1606 07/14/20 1944 07/14/20 2326 07/15/20 0334  GLUCAP 96 90 105* 105* 106*     This patient is critically ill with multiple organ system failure which requires frequent high complexity decision making, assessment, support, evaluation, and titration of therapies. This was completed through the application of advanced monitoring technologies and extensive interpretation of multiple databases. During this encounter critical care time was devoted to patient care services described in this note for 37 minutes.   07/17/20, DO 07/15/20 9:56 AM  Pulmonary &  Critical Care

## 2020-07-16 DIAGNOSIS — G40901 Epilepsy, unspecified, not intractable, with status epilepticus: Secondary | ICD-10-CM | POA: Diagnosis not present

## 2020-07-16 DIAGNOSIS — K567 Ileus, unspecified: Secondary | ICD-10-CM | POA: Diagnosis not present

## 2020-07-16 DIAGNOSIS — F4325 Adjustment disorder with mixed disturbance of emotions and conduct: Secondary | ICD-10-CM | POA: Diagnosis not present

## 2020-07-16 LAB — GLUCOSE, CAPILLARY
Glucose-Capillary: 102 mg/dL — ABNORMAL HIGH (ref 70–99)
Glucose-Capillary: 127 mg/dL — ABNORMAL HIGH (ref 70–99)
Glucose-Capillary: 101 mg/dL — ABNORMAL HIGH (ref 70–99)

## 2020-07-16 MED ORDER — PALIPERIDONE ER 6 MG PO TB24
6.0000 mg | ORAL_TABLET | Freq: Every day | ORAL | Status: DC
  Administered 2020-07-17 – 2020-07-20 (×4): 6 mg via ORAL
  Filled 2020-07-16 (×7): qty 1

## 2020-07-16 MED ORDER — SENNOSIDES 8.8 MG/5ML PO SYRP
5.0000 mL | ORAL_SOLUTION | Freq: Two times a day (BID) | ORAL | Status: DC | PRN
  Filled 2020-07-16: qty 5

## 2020-07-16 MED ORDER — LOSARTAN POTASSIUM 50 MG PO TABS
25.0000 mg | ORAL_TABLET | Freq: Once | ORAL | Status: AC
  Administered 2020-07-16: 25 mg via ORAL
  Filled 2020-07-16: qty 1

## 2020-07-16 MED ORDER — BISACODYL 10 MG RE SUPP: 10.0000 mg | Freq: Every day | RECTAL | Status: DC | PRN

## 2020-07-16 MED ORDER — LACTULOSE 10 GM/15ML PO SOLN
30.0000 g | Freq: Every day | ORAL | Status: DC
  Administered 2020-07-16 – 2020-07-20 (×5): 30 g via ORAL
  Filled 2020-07-16 (×3): qty 60
  Filled 2020-07-16: qty 45
  Filled 2020-07-16: qty 60

## 2020-07-16 MED ORDER — LOSARTAN POTASSIUM 50 MG PO TABS
50.0000 mg | ORAL_TABLET | Freq: Every day | ORAL | Status: DC
  Administered 2020-07-17: 50 mg via ORAL
  Filled 2020-07-16: qty 1

## 2020-07-16 NOTE — Progress Notes (Signed)
1325:Pt.s mother came to visit pt.  1408: Pt.s mother has left now. Will continue to monitor pt.

## 2020-07-16 NOTE — Progress Notes (Signed)
Pt.s breakfast has arrived, pt requested breakfast to be on the side of the bed. Will continue to monitor pt.

## 2020-07-16 NOTE — Plan of Care (Addendum)
Spoke to on call Psychiatry- patient still IVC and needs inpatient treatment. Medically stable to discharge to inpatient psychiatry at this point. Spoke to on-call SW Joey, who will send referrals to arrange transfer to inpatient psych.  Steffanie Dunn, DO 07/16/20 10:16 AM [place] [facility]

## 2020-07-16 NOTE — Plan of Care (Signed)
No current psych beds available. Will transfer to med surg floor. Still must have 1:1 sitter due to IVC.   Will transfer to [facility] tomorrow. Discussed with Dr. Chipper Herb.   Steffanie Dunn, DO 07/16/20 6:44 PM [place] [facility]

## 2020-07-16 NOTE — Progress Notes (Signed)
Pt has been making phonecalls to his mother that he is being discharged today. Pt reminded that it is not entirely sure when he is leaving. Pt very determined with the thought that he might leave today. Psychiatrist and RN have already spoke with pt about being IVC. Will continue to monitor pt.

## 2020-07-16 NOTE — Progress Notes (Signed)
Pt.s mother is back with food. Pt sitting up and eating his dinner. Will continue to monitor pt.

## 2020-07-16 NOTE — Progress Notes (Signed)
Downgrade from PCCM  Initially came for seizure, intubated. Very agitated combative after extubation was on Precedex drip for prolonged time now considered etiology to be acute psychosis versus worsening of schizophrenia. Psych medications being titrated by psychiatry. Psych follows and likely recommend inpatient psychiatry treatment after medically stabilized.  Off Precedex drip yesterday, on multiple as needed psych meds. Blood pressure still not well controlled, started hydralazine today. Kidney function improved. On one-to-one sitter.

## 2020-07-16 NOTE — TOC Initial Note (Signed)
Transition of Care [facility]) - Initial/Assessment Note    Patient Details  Name: Dylan Moon MRN: [id number] Date of Birth: 1995/12/05  [facility]) CM/SW Contact:    Annalee Genta, LCSW Phone Number: 07/16/2020, 2:37 PM  Clinical Narrative: CSW re-faxed psychiatric inpatient hospitalization referral for inpatient psychiatric hospitals in [place] patient is eligible for. Patient will update team once CSW hears back from facilities.                 Expected Discharge Plan: Psychiatric Hospital Barriers to Discharge: Psych Bed not available   Patient Goals and CMS Choice        Expected Discharge Plan and Services Expected Discharge Plan: Psychiatric Hospital                                              Prior Living Arrangements/Services                       Activities of Daily Living Home Assistive Devices/Equipment: None ADL Screening (condition at time of admission) Patient's cognitive ability adequate to safely complete daily activities?:  (intubated/sedated) Is the patient deaf or have difficulty hearing?: No Does the patient have difficulty seeing, even when wearing glasses/contacts?: No Does the patient have difficulty concentrating, remembering, or making decisions?: No Does the patient have difficulty dressing or bathing?: No Does the patient have difficulty walking or climbing stairs?: No  Permission Sought/Granted                  Emotional Assessment              Admission diagnosis:  Status epilepticus (HCC) [G40.901] Seizures (HCC) [R56.9] Patient Active Problem List   Diagnosis Date Noted  . Family discord 03/15/2020  . Aggressive behavior 03/15/2020  . HCAP (healthcare-associated pneumonia) 12/27/2019  . Status epilepticus (HCC) 12/24/2019  . Acute respiratory failure with hypoxemia (HCC)   . Seizures (HCC) 07/10/2018  . 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  Noncompliance with medication regimen 12/11/2017  . Seizure (HCC) 05/24/2016  . Dehydration, mild 05/24/2016  . AKI (acute kidney injury) (HCC) 05/24/2016  . Marijuana use 05/24/2016   PCP:  [facility]:   [id number] - [place], [place] - [address] [place] [address] Phone: [phone] Fax: [phone]     Social Determinants of Health (SDOH) Interventions    Readmission Risk Interventions No flowsheet data found.

## 2020-07-16 NOTE — Plan of Care (Signed)
Updated mother on his care and that he is medically stable for discharge to behavioral health and we are working on placing him.   Steffanie Dunn, DO 07/16/20 2:13 PM [place] [facility]

## 2020-07-16 NOTE — Progress Notes (Signed)
NAME:  Dylan Moon, MRN:  [id number], DOB:  03/14/1996, LOS: 15 ADMISSION DATE:  07/01/2020, CONSULTATION DATE:  10/9 REFERRING MD:  ED, CHIEF COMPLAINT:  Seizure   Brief History   24 y/o male with history of seizures admitted on 10/9 in the setting of over 30 minutes of seizure activity.  Extubated 10/13.  Past Medical History  Epilepsy Anxiety ADHD  Significant Hospital Events   Intubated in ER 10/9 Extubated 10/13 Intubated 10/19 Extubated 10/21  Consults:  Neuro Psychiatry  Procedures:    Significant Diagnostic Tests:  CT Head 10/9 >> Negative for intracranial abnormalities   cEEG 10/9 - 10/10 >> Negative for seizures or epileptiform discharges. Severe diffuse encephalopathy.  10/18 valproate level elevated and ammonia elevated  10/20 MRI brain > negative for acute intracranial abnormality, but positive for a small left-side subependymal gray matter heterotopia, stable since 2018.  No evidence of mesial temporal sclerosis, fluid in pharynx, mastoid, paranasal sinuses  10/20 LP > no significant WBC or RBC  10/20 CT abdomen/pelvis > colonic ileus, RLL infiltrate  Micro Data:  10/9 sars: negative 10/10 blood: ngtd  Antimicrobials:  Cefepime 10/14 >> 10/14 Zosyn 10/14 >> 10/20  Interim history/subjective:  Remains off precedex. More agitated yesterday during the afternoon, but better this morning and asking to go home.  Objective   Blood pressure (!) 162/97, pulse 86, temperature 98.6 F (37 C), temperature source Oral, resp. rate (!) 24, height 6' (1.829 m), weight 87 kg, SpO2 98 %.        Intake/Output Summary (Last 24 hours) at 07/16/2020 0909 Last data filed at 07/16/2020 0835 Gross per 24 hour  Intake 194.64 ml  Output 1275 ml  Net -1080.36 ml   Filed Weights   07/11/20 0403 07/12/20 0500 07/13/20 0500  Weight: 87.6 kg 87.5 kg 87 kg    Examination:  General: Healthy-appearing young man lying in bed watching TV HENT: [place]/AT, eyes  anicteric PULM: Breathing comfortably on room air, no conversational dyspnea.  Clear to auscultation bilaterally. CV: Regular rate and rhythm, no murmurs GI: Soft, nontender, nondistended MSK: Appropriate muscle mass for age, no clubbing, cyanosis, or peripheral edema Neuro: Awake and alert, moving all extremities spontaneously, answering questions appropriately Psych: calm, cooperative during exam, excitedly asking if he can go home today. No pressured speech. Oriented to place and situation.   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 Xanax PRN (needed 3 times in the past 24 hours) > haldol prn- only needed once yesterday > d/c precedex > Continue eslicarbazepine, valproate for seizures. > Psychiatry consult; appreciate their assistance. IVC'ed and likely will require inpatient treatment.  Acute respiratory failure due to inability to protect airway in setting of needing procedures on 10/19; resolved > aspiration precautions and encourage good pulmonary hygiene  Right lower lobe pneumonia, resolved > monitor off antibiotics  Colonic ileus; appears resolved > bowel regimen decreasing > Liberalize diet. Con't OOB mobility. - d/c maintenance fluids  Bradycardia due to precedex -wean precedex   Best practice:  Diet:  regular Pain/Anxiety/Delirium protocol (if indicated): n/a VAP protocol (if indicated): n/a DVT prophylaxis: lovenox GI prophylaxis:  famotidine Glucose control: SSI Mobility: bed rest Code Status: full Family Communication: L/m for mother Disposition:   Labs   CBC: Recent Labs  Lab 07/11/20 0431 07/12/20 0955 07/13/20 0147 07/14/20 0355 07/15/20 0639  WBC 5.2 8.3 6.7 9.9 5.2  NEUTROABS 2.2 4.9 3.8 6.9 2.1  HGB 15.5 14.2 13.9 12.9* 13.7  HCT 46.3 42.5 41.6 39.2 41.0  MCV 86.1 88.5 86.8 86.7 89.3  PLT 379 357 394 430* 409*    Basic Metabolic Panel: Recent Labs  Lab 07/11/20 0431 07/11/20 1535 07/12/20 0955 07/13/20 0147 07/14/20 0355 07/15/20 0639  NA 137  --  138 137 141 138  K 4.8  --  4.7 3.9 3.7 3.9  CL 105  --  104 105 104 104  CO2 23  --  21* 22 21* 24  GLUCOSE 101*  --  87 125* 95 109*  BUN 19  --  23* 17 7 10   CREATININE 1.14  --  1.14 0.99 1.11 1.02  CALCIUM 9.2  --  9.3 9.0 9.6 9.5  MG  --  2.1 2.2 1.8  --   --   PHOS  --  4.7* 4.8* 4.4  --   --    GFR: Estimated Creatinine Clearance: 121.5 mL/min (by C-G formula based on SCr of 1.02 mg/dL). Recent Labs  Lab 07/12/20 0955 07/13/20 0147 07/14/20 0355 07/15/20 0639  WBC 8.3 6.7 9.9 5.2    Liver Function Tests: Recent Labs  Lab 07/11/20 0431 07/12/20 0955 07/13/20 0147 07/14/20 0355 07/15/20 0639  AST 36 38 26 55* 37  ALT 25 31 25 28 26   ALKPHOS 38 39 42 43 38  BILITOT 0.5 0.3 0.3 0.5 0.5  PROT 6.4* 6.9 6.1* 7.2 6.6  ALBUMIN 3.2* 3.2* 2.9* 3.7 3.1*   No results for input(s): LIPASE, AMYLASE in the last 168 hours. Recent Labs  Lab 07/10/20 1155 07/12/20 0955 07/14/20 0355  AMMONIA 60* 44* 37*    ABG    Component Value Date/Time   PHART 7.414 07/11/2020 1650   PCO2ART 36.4 07/11/2020 1650   PO2ART 87.3 07/11/2020 1650   HCO3 22.8 07/11/2020 1650   TCO2 26 07/03/2020 0319   ACIDBASEDEF 1.1 07/11/2020 1650   O2SAT 96.1 07/11/2020 1650     Coagulation Profile: No results for input(s): INR, PROTIME in the last 168 hours.  Cardiac Enzymes: No results for input(s): CKTOTAL, CKMB, CKMBINDEX, TROPONINI in  the last 168 hours.  HbA1C: Hgb A1c MFr Bld  Date/Time Value Ref Range Status  12/24/2019 05:13 PM 5.8 (H) 4.8 - 5.6 % Final    Comment:    (NOTE) Pre diabetes:          5.7%-6.4% Diabetes:              >6.4% Glycemic control for   <7.0% adults with diabetes     CBG: Recent Labs  Lab 07/14/20 1606 07/14/20 1944 07/14/20 2326 07/15/20 0334 07/16/20 0736  GLUCAP 90 105* 105* 106* 102*      07/17/20, DO 07/16/20 9:09 AM [place] [facility]

## 2020-07-16 NOTE — Consult Note (Signed)
[facility] Face-to-Face Psychiatry Consult   Reason for Consult: ''severe behavioral abnormality requiring multiple forms of medical treatment.''   Referring Physician: Noemi Chapel, DO Patient Identification: Dylan Moon MRN:  [id number] Principal Diagnosis: Seizures Diagnosis:  Active Problems:   Adjustment disorder with mixed disturbance of emotions and conduct   Status epilepticus ([place])   Total Time spent with patient: 45 minutes  Subjective:  ''I need you to discharge me today, I got job to do, I am into photography and stuffs.'' Objective : Patient with history of seizure disorder, mood disorder and chronic cannabis user. Chart review revealed that patient was admitted due to status epilepticus that required intubation. Today, patient is alert, oriented but has no insight into his medical and psychiatric issues. He reports non-compliant to psychiatric treatment due to recurrent seizure episodes and self medication with daily Cannabis use for the past 3 years. Patient has become less agitated, aggressive but remains impulsive, grandiose and irritable. 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  Chart review reveal as follows: Collateral obtained from his legal guardian (documentation not available although chart review shows existing guardianship).  As per mother she was going to guardianship and power of attorney for finances and health due to intractable seizures, brain damage, bipolar disorder, anxiety, and depression.  Mother patient has a longstanding psychiatric history that has been complicated due to his persistent seizure disorder.  She reports his seizures started in 2017, and have been intractable since.  She reports that patient has agreed to treatment, and will leave prior to  procedure being completed.  She states he is in dire need of having the stimulator placed at [facility], however he will not consent to this procedure. "  I am trying to have him being incompetent."  When assessing for developmental history mother admits to intrauterine drug use " I did get high back then.  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  She reports he met all of his developmental milestones.  She reports during school patient did struggle throughout grade school, and received a diagnosis of ADHD at the age of 52.  She states he was unable to take most stimulant medication due to side effects of increased heart rate and increase in blood pressure.  Mother reports having psychological evaluation completed, remains unclear of his IQ score however reports there was some concerns with his ability to progress as an adult.  She reports his IQ was pretty low.  Mother identifies multiple legal charges, states he gets in trouble every 2 weeks. "  He has two stalking charges, property destruction, or assault child's grandfather.  The court has ordered a psychological evaluation."  Mother reports irritability, mood swings throughout the day, impulsivity, and poor impulse control.  She denies any history of suicide attempts in the past.  Mother did authorize consent for medication adjustments, to include possible recommendation for inpatient psychiatric admission potentially at the state level to help determine competency.  Did discuss with mother goal is to get patient psychiatrically stabilized, and receives services outpatient due to patient's ongoing seizure disorder that continues to complicate his overall level of care.   Past Psychiatric History: ADHD per patient.  Per mom patient has been diagnosed with  bipolar, anxiety, depression.  It remains unclear if patient has a diagnosis of IDD, however she reports he was in special classes in  grade school. "  They did use to pull him out of class and put him at other classes with other children."  Patient reports daily substance use of THC only, he denies any use of any synthetic substances. "  I used to smoke about 8 blunts a day however the seizures have been slow me down."   Risk to Self:  Yes Risk to Others:  none Prior Inpatient Therapy:  none Prior Outpatient Therapy:  Yes current evaluation at [facility].  Per chart review has also been evaluated [facility] last visit March 08, 2020.  Per his chart review patient has been diagnosed with ADHD, personality disorder unspecified, adjustment disorder with mixed disturbances of emotions and conduct. Past Medical History:  Past Medical History:  Diagnosis Date  . ADHD   . Scoliosis   . Seizures ([place])    most recent 12/02/17    Past Surgical History:  Procedure Laterality Date  . NO PAST SURGERIES     Family History:  Family History  Problem Relation Age of Onset  . Diabetes Mother   . Hypertension Mother   . Cancer Other   . Diabetes Father   . Seizures Maternal Grandfather    Family Psychiatric  History: none Social History:  Social History   Substance and Sexual Activity  Alcohol Use No     Social History   Substance and Sexual Activity  Drug Use Yes  . Types: Marijuana   Comment: Daily use of marijuana.    Social History   Socioeconomic History  . Marital status: Single    Spouse name: Not on file  . Number of children: 0  . Years of education: HS  . Highest education level: Not on file  Occupational History  . Occupation: Horticulturist, commercial  Tobacco Use  . Smoking status: Never Smoker  . Smokeless tobacco: Never Used  Vaping Use  . Vaping Use: Never used  Substance and Sexual Activity  . Alcohol use: No  . Drug use: Yes    Types: Marijuana    Comment: Daily use of marijuana.  . Sexual activity: Not Currently  Other Topics Concern  . Not on file  Social  History Narrative   Lives at home his mother.   3-4 sodas per week.   Right-handed.   Social Determinants of Health   Financial Resource Strain:   . Difficulty of Paying Living Expenses: Not on file  Food Insecurity:   . Worried About Charity fundraiser in the Last Year: Not on file  . Ran Out of Food in the Last Year: Not on file  Transportation Needs:   . Lack of Transportation (Medical): Not on file  . Lack of Transportation (Non-Medical): Not on file  Physical Activity:   . Days of Exercise per Week: Not on file  . Minutes of Exercise per Session: Not on file  Stress:   . Feeling of Stress : Not on file  Social Connections:   . Frequency of Communication with Friends and Family: Not on file  . Frequency of Social Gatherings with Friends and Family: Not on file  . Attends Religious Services: Not on file  . Active Member of Clubs or Organizations: Not on file  . Attends Archivist Meetings: Not on file  . Marital Status: Not on file  Additional Social History:    Allergies:   Allergies  Allergen Reactions  . Keppra [Levetiracetam] Other (See Comments)    Irritability   . Tramadol Other (See Comments)    Contraindicated with current medications (??)  . Vimpat [Lacosamide] Other (See Comments)    Causes anger    Labs:  Results for orders placed or performed during the hospital encounter of 07/01/20 (from the past 48 hour(s))  Glucose, capillary     Status: None   Collection Time: 07/14/20 12:00 PM  Result Value Ref Range   Glucose-Capillary 96 70 - 99 mg/dL    Comment: Glucose reference range applies only to samples taken after fasting for at least 8 hours.  Glucose, capillary     Status: None   Collection Time: 07/14/20  4:06 PM  Result Value Ref Range   Glucose-Capillary 90 70 - 99 mg/dL    Comment: Glucose reference range applies only to samples taken after fasting for at least 8 hours.  Glucose, capillary     Status: Abnormal   Collection Time:  07/14/20  7:44 PM  Result Value Ref Range   Glucose-Capillary 105 (H) 70 - 99 mg/dL    Comment: Glucose reference range applies only to samples taken after fasting for at least 8 hours.  Glucose, capillary     Status: Abnormal   Collection Time: 07/14/20 11:26 PM  Result Value Ref Range   Glucose-Capillary 105 (H) 70 - 99 mg/dL    Comment: Glucose reference range applies only to samples taken after fasting for at least 8 hours.  Glucose, capillary     Status: Abnormal   Collection Time: 07/15/20  3:34 AM  Result Value Ref Range   Glucose-Capillary 106 (H) 70 - 99 mg/dL    Comment: Glucose reference range applies only to samples taken after fasting for at least 8 hours.  Comprehensive metabolic panel     Status: Abnormal   Collection Time: 07/15/20  6:39 AM  Result Value Ref Range   Sodium 138 135 - 145 mmol/L   Potassium 3.9 3.5 - 5.1 mmol/L   Chloride 104 98 - 111 mmol/L   CO2 24 22 - 32 mmol/L   Glucose, Bld 109 (H) 70 - 99 mg/dL    Comment: Glucose reference range applies only to samples taken after fasting for at least 8 hours.   BUN 10 6 - 20 mg/dL   Creatinine, Ser 1.02 0.61 - 1.24 mg/dL   Calcium 9.5 8.9 - 10.3 mg/dL   Total Protein 6.6 6.5 - 8.1 g/dL   Albumin 3.1 (L) 3.5 - 5.0 g/dL   AST 37 15 - 41 U/L   ALT 26 0 - 44 U/L   Alkaline Phosphatase 38 38 - 126 U/L   Total Bilirubin 0.5 0.3 - 1.2 mg/dL   GFR, Estimated >60 >60 mL/min    Comment: (NOTE) Calculated using the CKD-EPI Creatinine Equation (2021)    Anion gap 10 5 - 15    Comment: Performed at [facility] [address] [place], [place] [address]  CBC with Differential/Platelet     Status: Abnormal   Collection Time: 07/15/20  6:39 AM  Result Value Ref Range   WBC 5.2 4.0 - 10.5 K/uL   RBC 4.59 4.22 - 5.81 MIL/uL   Hemoglobin 13.7 13.0 - 17.0 g/dL   HCT 41.0 39 - 52 %   MCV 89.3 80.0 - 100.0 fL   MCH 29.8 26.0 - 34.0 pg   MCHC  33.4 30.0 - 36.0 g/dL   RDW 13.4 11.5 - 15.5 %   Platelets 409 (H)  150 - 400 K/uL   nRBC 0.0 0.0 - 0.2 %   Neutrophils Relative % 40 %   Neutro Abs 2.1 1.7 - 7.7 K/uL   Lymphocytes Relative 46 %   Lymphs Abs 2.4 0.7 - 4.0 K/uL   Monocytes Relative 11 %   Monocytes Absolute 0.6 0.1 - 1.0 K/uL   Eosinophils Relative 1 %   Eosinophils Absolute 0.1 0.0 - 0.5 K/uL   Basophils Relative 1 %   Basophils Absolute 0.0 0.0 - 0.1 K/uL   Immature Granulocytes 1 %   Abs Immature Granulocytes 0.03 0.00 - 0.07 K/uL    Comment: Performed at [facility] [address] [place], [place] [address]  Glucose, capillary     Status: Abnormal   Collection Time: 07/16/20  7:36 AM  Result Value Ref Range   Glucose-Capillary 102 (H) 70 - 99 mg/dL    Comment: Glucose reference range applies only to samples taken after fasting for at least 8 hours.    Current Facility-Administered Medications  Medication Dose Route Frequency Provider Last Rate Last Admin  . 0.9 %  sodium chloride infusion  250 mL Intravenous Continuous Juanito Doom, MD   Held at 07/11/20 1707  . acetaminophen (TYLENOL) 160 MG/5ML solution 650 mg  650 mg Oral Q6H PRN Juanito Doom, MD      . ALPRAZolam Duanne Moron) tablet 2 mg  2 mg Oral TID PRN Juanito Doom, MD   2 mg at 07/16/20 0534  . atropine injection 1 mg  1 mg Intravenous Once PRN Simonne Maffucci B, MD      . bisacodyl (DULCOLAX) suppository 10 mg  10 mg Rectal Daily PRN Juanito Doom, MD   10 mg at 07/15/20 0955  . busPIRone (BUSPAR) tablet 10 mg  10 mg Oral BID Suella Broad, FNP   10 mg at 07/16/20 1007  . chlorhexidine (PERIDEX) 0.12 % solution 15 mL  15 mL Mouth Rinse BID Frederik Pear, MD   15 mL at 07/15/20 2315  . chlorhexidine gluconate (MEDLINE KIT) (PERIDEX) 0.12 % solution 15 mL  15 mL Mouth Rinse BID Simonne Maffucci B, MD   15 mL at 07/16/20 0900  . Chlorhexidine Gluconate Cloth 2 % PADS 6 each  6 each Topical Daily Sampson Goon, MD   6 each at 07/16/20 1020  . docusate (COLACE) 50 MG/5ML liquid 100  mg  100 mg Oral BID PRN Simonne Maffucci B, MD      . enoxaparin (LOVENOX) injection 40 mg  40 mg Subcutaneous Q24H Sampson Goon, MD   40 mg at 07/15/20 1713  . escitalopram (LEXAPRO) tablet 10 mg  10 mg Oral QHS Suella Broad, FNP   10 mg at 07/15/20 2105  . Eslicarbazepine Acetate TABS 1,600 mg  1,600 mg Oral Daily Simonne Maffucci B, MD   1,600 mg at 07/16/20 1017  . famotidine (PEPCID) tablet 20 mg  20 mg Oral BID Simonne Maffucci B, MD   20 mg at 07/16/20 1008  . haloperidol lactate (HALDOL) injection 5 mg  5 mg Intravenous Q6H PRN Juanito Doom, MD   5 mg at 07/15/20 1132  . lactulose (CHRONULAC) 10 GM/15ML solution 30 g  30 g Oral Daily Noemi Chapel P, DO   30 g at 07/16/20 1007  . losartan (COZAAR) tablet 25 mg  25 mg  Oral Daily Noemi Chapel P, DO   25 mg at 07/16/20 1007  . MEDLINE mouth rinse  15 mL Mouth Rinse q12n4p Ogan, [place]wo U, MD   15 mL at 07/15/20 1650  . OLANZapine (ZYPREXA) tablet 5 mg  5 mg Oral QHS Suella Broad, FNP   5 mg at 07/15/20 2105  . ondansetron (ZOFRAN) injection 4 mg  4 mg Intravenous Q6H PRN Anders Simmonds, MD   4 mg at 07/12/20 1056  . paliperidone (INVEGA) 24 hr tablet 3 mg  3 mg Oral Daily Suella Broad, FNP   3 mg at 07/16/20 1016  . phenytoin (DILANTIN) injection 100 mg  100 mg Intravenous Q8H Lora Havens, MD   100 mg at 07/16/20 0534  . polyethylene glycol (MIRALAX / GLYCOLAX) packet 17 g  17 g Oral Daily PRN Sampson Goon, MD   17 g at 07/11/20 1001  . sennosides (SENOKOT) 8.8 MG/5ML syrup 5 mL  5 mL Oral BID PRN Simonne Maffucci B, MD      . valproate (DEPACON) 500 mg in dextrose 5 % 50 mL IVPB  500 mg Intravenous Q6H Lora Havens, MD 55 mL/hr at 07/16/20 0534 500 mg at 07/16/20 0534    Musculoskeletal: Strength & Muscle Tone: increased Gait & Station: unsteady Patient leans: N/A  Psychiatric Specialty Exam: Physical Exam Vitals and nursing note reviewed.  HENT:     Head: Normocephalic.      Nose: Nose normal.  Pulmonary:     Effort: Pulmonary effort is normal.  Musculoskeletal:     Cervical back: Normal range of motion.  Neurological:     General: No focal deficit present.     Mental Status: He is alert.  Psychiatric:        Attention and Perception: He is inattentive.        Mood and Affect: Mood is anxious.        Speech: Speech normal.        Behavior: Behavior is aggressive.        Thought Content: Thought content normal.        Cognition and Memory: Cognition normal.        Judgment: Judgment is impulsive.     Review of Systems  Psychiatric/Behavioral: Positive for behavioral problems. The patient is nervous/anxious.   All other systems reviewed and are negative.   Blood pressure (!) 155/95, pulse 82, temperature 98.6 F (37 C), temperature source Oral, resp. rate (!) 23, height 6' (1.829 m), weight 87 kg, SpO2 99 %.Body mass index is 26.01 kg/m.  General Appearance: Casual  Eye Contact:  Good  Speech:  Clear and Coherent  Volume:  Increased  Mood:  Irritable  Affect:  Congruent  Thought Process:  Coherent  Orientation:  Full (Time, Place, and Person)  Thought Content:  Obsessions  Suicidal Thoughts:  No  Homicidal Thoughts:  No  Memory:  Immediate;   Fair Recent;   Fair Remote;   Fair  Judgement:  Poor  Insight:  Lacking  Psychomotor Activity:  Increased  Concentration:  Concentration: Fair and Attention Span: Fair  Recall:  [employer] of Knowledge:  Fair  Language:  Fair  Akathisia:  No  Handed:  Right  AIMS (if indicated):     Assets:  Housing Leisure Time Resilience Social Support  ADL's:  Intact  Cognition:  WNL  Sleep:       24 year old male with poorly controlled status epilepticus, ADHD, substance use disorder,  bipolar. Patient will benefit from inpatient psychiatric admission when he is medically cleared.  suicidal ideations at this time.  Treatment Plan Summary: -Discontinue  Olanzapine to 5 mg p.o. nightly. Increase  Paliperidone to 6 mg p.o. daily -Continue  Lexapro 10 mg daily -Continue Buspar 10 mg BID -Continue Xanax 2 mg TID PRN -Consider social worker consult to facilitate inpatient psychiatric admission  Disposition: Recommend psychiatric Inpatient admission when medically cleared.  Corena Pilgrim, MD 07/16/2020 11:35 AM

## 2020-07-17 LAB — ALBUMIN: Albumin: 3.5 g/dL (ref 3.5–5.0)

## 2020-07-17 LAB — PHENYTOIN LEVEL, TOTAL: Phenytoin Lvl: 7.8 ug/mL — ABNORMAL LOW (ref 10.0–20.0)

## 2020-07-17 MED ORDER — PHENYTOIN SODIUM 50 MG/ML IJ SOLN: 100.0000 mg | INTRAMUSCULAR | Status: DC

## 2020-07-17 MED ORDER — HYDRALAZINE HCL 25 MG PO TABS
25.0000 mg | ORAL_TABLET | Freq: Four times a day (QID) | ORAL | Status: DC | PRN
  Administered 2020-07-17 – 2020-07-18 (×3): 25 mg via ORAL
  Filled 2020-07-17 (×3): qty 1

## 2020-07-17 MED ORDER — LABETALOL HCL 5 MG/ML IV SOLN
10.0000 mg | INTRAVENOUS | Status: DC | PRN
  Administered 2020-07-17 – 2020-07-18 (×2): 10 mg via INTRAVENOUS
  Filled 2020-07-17 (×2): qty 4

## 2020-07-17 MED ORDER — SODIUM CHLORIDE 0.9 % IV SOLN
125.0000 mg | INTRAVENOUS | Status: DC
  Filled 2020-07-17: qty 2.5

## 2020-07-17 NOTE — Progress Notes (Signed)
Hydralazine PRN in pt med list. BP rechecked 155/90. Order parameters not met. Pt does not complain of any distress.  Will continue to monitor.

## 2020-07-17 NOTE — Progress Notes (Signed)
Prn med administered effective and BP 155/90. Reported off to oncoming RN and sitter remains at bedside. Dionne Bucy RN

## 2020-07-17 NOTE — Progress Notes (Signed)
  Speech Language Pathology Treatment: Dysphagia  Patient Details Name: Dylan Moon MRN: [id number] DOB: 05-05-1996 Today's Date: 07/17/2020 Time: 0757-3225 SLP Time Calculation (min) (ACUTE ONLY): 15 min  Assessment / Plan / Recommendation Clinical Impression  Patient seen for skilled ST session focused on swallow function goals. Patient was very alert, asking SLP "Are you the psychiatrist?" (he is anxious to leave but knows he has to be cleared by psychiatrist first). He consumed regular solids (salad) with thin liquids without difficulty and without overt s/s of aspiration with SLP observing end of meal and NT reporting no observed difficulties prior to SLP entering room. As patient's ileus is now resolved and he is maintaining more stable alertness, SLP to sign off at this time.  Patient is safe on regular texture solids and thin liquids diets without restrictions.     HPI HPI: Pt is a 24 y/o male with history of seizures admitted on 10/9 in the setting of over 30 minutes of seizure activity. ETT 10/9-10/13; 10/19-10/21. Hospital course complicated by ileus and behavioral disturbances with agitation, visual hallucinations. 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 PMH includes: seizures, scoliosis, ADHD      SLP Plan  Discharge SLP treatment due to (comment);All goals met       Recommendations  Diet recommendations: Regular;Thin liquid Liquids provided via: Cup;Straw Medication Administration: Whole meds with liquid Supervision: Patient able to self feed Compensations: Minimize environmental distractions;Small sips/bites;Slow rate Postural Changes and/or Swallow Maneuvers: Seated upright 90 degrees                Oral Care Recommendations: Oral care BID Follow up Recommendations: None SLP Visit Diagnosis: Dysphagia, unspecified (R13.10) Plan: Discharge SLP treatment due to (comment);All goals met       [place], MA,  CCC-SLP 07/17/20 1:44 PM

## 2020-07-17 NOTE — Progress Notes (Signed)
Physical Therapy Treatment Patient Details Name: Dylan Moon MRN: [id number] DOB: 05/12/1996 Today's Date: 07/17/2020    History of Present Illness 24 y.o male presenting with seizure that had 30 min of uninterrupted seizure that was not resolved with benzodiazepines . He was given further benzodiazepines on arrival in the department of emergency medicine however he subsequently became apneic and required intubation. Having extreme agitation along course. PMH includes seizure history, ADHD. Psych following.    PT Comments    Pt eager to d/c home, stating on PT arrival "are you the psychiatrist? I want to go home". Pt with poor insight into deficits this day, requiring PT assist to correct posterior LOB with balance challenges but is unaware of LOB. PT initiated LE strengthening exercises with pt, with focus on challenging pt's static and dynamic balance. Pt tolerated well, states "I feel back to how I was" multiple times throughout session although this PT does not feel pt is at functional baseline. PT to continue to follow acutely.    Follow Up Recommendations  Other (comment) (pending psych workup)     Equipment Recommendations  None recommended by PT    Recommendations for Other Services       Precautions / Restrictions Precautions Precautions: Fall Restrictions Weight Bearing Restrictions: No    Mobility  Bed Mobility Overal bed mobility: Needs Assistance Bed Mobility: Supine to Sit     Supine to sit: Supervision     General bed mobility comments: for safety, cuing for sequencing task and waiting for PT to adjust environment (pt attempting to sit EOB with rail up).  Transfers Overall transfer level: Needs assistance Equipment used: None Transfers: Sit to/from Stand Sit to Stand: Min guard         General transfer comment: min guard for safety  Ambulation/Gait Ambulation/Gait assistance: Min assist;Min guard Gait Distance (Feet): 300 Feet Assistive  device: None Gait Pattern/deviations: Step-through pattern;Decreased stride length;Trunk flexed Gait velocity: decr   General Gait Details: min guard for safety, occasional min assist to steady and correct posterior LOB with direction changes and head turns.   Stairs Stairs: Yes Stairs assistance: Min guard;Min assist Stair Management: Two rails;No rails;Forwards;Step to pattern Number of Stairs: 2 General stair comments: multiple trials; occasional min assist to correct posterior bias   Wheelchair Mobility    Modified Rankin (Stroke Patients Only)       Balance Overall balance assessment: Needs assistance Sitting-balance support: No upper extremity supported;Feet supported Sitting balance-Leahy Scale: Fair     Standing balance support: Single extremity supported;During functional activity Standing balance-Leahy Scale: Fair Standing balance comment: overall fair balance, intermittent min assist to correct posterior leaning                            Cognition Arousal/Alertness: Awake/alert Behavior During Therapy: WFL for tasks assessed/performed Overall Cognitive Status: No family/caregiver present to determine baseline cognitive functioning Area of Impairment: Problem solving;Safety/judgement                         Safety/Judgement: Decreased awareness of deficits;Decreased awareness of safety   Problem Solving: Slow processing;Requires tactile cues;Difficulty sequencing;Requires verbal cues General Comments: Pt demonstrating difficulty terminating tasks, requires repeated cuing to follow commands at times. Pt lacking insight into current deficits, states "I feel back to how I was" multiple times throughout session. Poor awareness of balance deficits      Exercises Other Exercises Other Exercises: Sit  to stand x10, without use of UEs to power up. cuing for slow eccentric lowering Other Exercises: Heel raises, x20, bilateral UE support for full  rise Other Exercises: Step ups, 8 inch step, x20, with intermittent UL to BL UE support    General Comments General comments (skin integrity, edema, etc.): HRmax 121 bpm      Pertinent Vitals/Pain Pain Assessment: Faces Faces Pain Scale: No hurt Pain Intervention(s): Limited activity within patient's tolerance;Monitored during session    Home Living                      Prior Function            PT Goals (current goals can now be found in the care plan section) Acute Rehab PT Goals Patient Stated Goal: return home soon PT Goal Formulation: With patient Time For Goal Achievement: 07/28/20 Potential to Achieve Goals: Good Progress towards PT goals: Progressing toward goals    Frequency    Min 3X/week      PT Plan Current plan remains appropriate    Co-evaluation              AM-PAC PT "6 Clicks" Mobility   Outcome Measure  Help needed turning from your back to your side while in a flat bed without using bedrails?: None Help needed moving from lying on your back to sitting on the side of a flat bed without using bedrails?: A Little Help needed moving to and from a bed to a chair (including a wheelchair)?: A Little Help needed standing up from a chair using your arms (e.g., wheelchair or bedside chair)?: A Little Help needed to walk in hospital room?: A Little Help needed climbing 3-5 steps with a railing? : A Little 6 Click Score: 19    End of Session   Activity Tolerance: Patient tolerated treatment well Patient left: with call bell/phone within reach;in chair;with nursing/sitter in room (sitter in room) Nurse Communication: Mobility status PT Visit Diagnosis: Other abnormalities of gait and mobility (R26.89);Difficulty in walking, not elsewhere classified (R26.2)     Time: 1191-4782 PT Time Calculation (min) (ACUTE ONLY): 19 min  Charges:  $Neuromuscular Re-education: 8-22 mins                    [name] E, PT Acute Rehabilitation  Services Pager [phone]  Office [phone]   [name] [name] 07/17/2020, 1:23 PM

## 2020-07-17 NOTE — Progress Notes (Signed)
Pt admitted to the unit as a transfer from 4N ICU. Pt A&O x4, MAE x4, skin intact with no pressure ulcer or opened wounds noted. Pt BP elevated and MD on-call paged and notified. New orders received, telemetry applied and verified with CCMD, NT called to second verify. Pt oriented to the unit and room, fall/safety precaution and prevention education done, seizure precaution initiated. Pt in bed with call light within reach. Will continue to closely monitor. Dionne Bucy RN   07/17/20 0616  Vitals  Temp 99 F (37.2 C)  Temp Source Oral  BP (!) 166/106  MAP (mmHg) 122  BP Location Left Arm  BP Method Automatic  Patient Position (if appropriate) Lying  Pulse Rate 82  Pulse Rate Source Monitor  Resp 20  MEWS COLOR  MEWS Score Color Green  Oxygen Therapy  SpO2 100 %  O2 Device Room Air  Pain Assessment  Pain Scale 0-10  Pain Score 0  MEWS Score  MEWS Temp 0  MEWS Systolic 0  MEWS Pulse 0  MEWS RR 0  MEWS LOC 0  MEWS Score 0

## 2020-07-17 NOTE — Progress Notes (Signed)
PROGRESS NOTE    Dylan Moon  OEV:[id number] DOB: 12-21-1995 DOA: 07/01/2020 PCP: [facility]   Chief Complaint  Patient presents with  . Seizures   Brief Narrative: 24 year old male with history of seizure disorder stable status ADHD who had seizure on the morning of 07/01/2020 for 30 minutes are interrupted given benzodiazepine by EMS but did not resolve and in the ED was apneic needing intubation was loaded with AEDs, was admitted to ICU, by neurology, PCCM, extubated 10/13.  Hospital course was complicated with needing intubation 10/19 and subsequently extubated 10/21.  Work-up with MRI brain no acute finding, LP no significant WBC or RBC, CT abdomen pelvis clinic ileus RLL infiltrate. Is followed by psychiatry currently in IVC and awaiting for transfer to inpatient psychiatry, he was transferred under [facility] service 10/25 while he is waiting for placement  Subjective: Seen this morning one-to-one sitter at the bedside.  Mother at the bedside. Blood pressure was high and has been started on losartan. Asking for discharge today, but aware that he needs to be cleared by psychiatrist   Assessment & Plan:  Status epilepticus: No further seizures while on medication.  He was seen by neurology underwent extensive work-up as above with LP, MRI brain.  Patient reports she was not compliant with this AEDs, and moving forward he reports he will be.  Continue  Eslicarbazepine, valproate for seizures.  Hypertension blood pressure poorly controlled diastolic in 829H. He is a started on losartan today monitor blood pressure and adjust meds.  Adjustment disorder/Behavioral disturbance concern for new schizophrenia given visualized sensation and severe agitation: Followed by psychiatry under IVC.  As you do not suspect substance abuse playing a role.  He is on one-to-one.  Hyperammonemia improved, newly seen and patient on Depakote without symptoms  RLL pneumonia resolved, is off  antibiotics  Colonic Ileus appears resolved  Acute respiratory failure inability to protect airway in the setting of needing procedure 10/19 resolved.  Bradycardia in the setting of Precedex use.  Nutrition: Diet Order            Diet regular Room service appropriate? Yes; Fluid consistency: Thin  Diet effective now                 Nutrition Problem: Inadequate oral intake Etiology: inability to eat Signs/Symptoms: NPO status Interventions: Ensure Enlive (each supplement provides 350kcal and 20 grams of protein), Refer to RD note for recommendations  Body mass index is 24.87 kg/m.  DVT prophylaxis: enoxaparin (LOVENOX) injection 40 mg Start: 07/01/20 1800 SCDs Start: 07/01/20 1235 Code Status:   Code Status: Full Code  Family Communication: plan of care discussed with patient and his mother at bedside.  Status is: Inpatient Remains inpatient appropriate because:Inpatient level of care appropriate due to severity of illness and Ongoing management of his uncontrolled hypertension, unsafe disposition as he needs inpatient psychiatric facility  Dispo: The patient is from: Home              Anticipated d/c is to: inpatient psych              Anticipated d/c date is: 1 day likely tomorrow once blood pressure is stable              Patient currently is not medically stable to d/c. Consultants:see note  Procedures:see note   CT Head 10/9>>Negative for intracranial abnormalities   cEEG 10/9 - 10/10>>Negative for seizures or epileptiform discharges. Severe diffuse encephalopathy.  10/18 valproate level elevated  and ammonia elevated  10/20 MRI brain > negative for acute intracranial abnormality, but positive for a small left-side subependymal gray matter heterotopia, stable since 2018.  No evidence of mesial temporal sclerosis, fluid in pharynx, mastoid, paranasal sinuses  10/20 LP > no significant WBC or RBC  10/20 CT abdomen/pelvis > colonic ileus, RLL  infiltrate   Culture/Microbiology      Component Value Date/Time   SDES CSF 07/11/2020 1608   SPECREQUEST NONE 07/11/2020 1608   CULT  07/11/2020 1608    NO GROWTH 3 DAYS Performed at [facility], [place] [address] [place], [place] [address]    REPTSTATUS 07/15/2020 FINAL 07/11/2020 1608    Other culture-see note  Medications: Scheduled Meds: . busPIRone  10 mg Oral BID  . chlorhexidine  15 mL Mouth Rinse BID  . chlorhexidine gluconate (MEDLINE KIT)  15 mL Mouth Rinse BID  . Chlorhexidine Gluconate Cloth  6 each Topical Daily  . enoxaparin (LOVENOX) injection  40 mg Subcutaneous Q24H  . escitalopram  10 mg Oral QHS  . Eslicarbazepine Acetate  1,600 mg Oral Daily  . famotidine  20 mg Oral BID  . lactulose  30 g Oral Daily  . losartan  50 mg Oral Daily  . mouth rinse  15 mL Mouth Rinse q12n4p  . paliperidone  6 mg Oral Daily  . phenytoin (DILANTIN) IV  100 mg Intravenous Q24H   Continuous Infusions: . sodium chloride Stopped (07/11/20 1707)  . phenytoin (DILANTIN) IV    . valproate sodium 500 mg (07/17/20 0636)    Antimicrobials: Cefepime 10/14 >> 10/14 Zosyn 10/14 >>10/20  Objective: Vitals: Today's Vitals   07/17/20 0400 07/17/20 0500 07/17/20 0616 07/17/20 0619  BP: (!) 143/111  (!) 166/106 (!) 171/109  Pulse: 87 80 82 79  Resp: (!) _0 Temp:   99 F (37.2 C)   TempSrc:   Oral   SpO2: 97% 98% 100% 100%  Weight:  83.2 kg    Height:      PainSc: 0-No pain  0-No pain     Intake/Output Summary (Last 24 hours) at 07/17/2020 0816 Last data filed at 07/17/2020 0700 Gross per 24 hour  Intake 1079.46 ml  Output 425 ml  Net 654.46 ml   Filed Weights   07/12/20 0500 07/13/20 0500 07/17/20 0500  Weight: 87.5 kg 87 kg 83.2 kg   Weight change:   Intake/Output from previous day: 10/24 0701 - 10/25 0700 In: 1079.5 [P.O.:480; IV Piggyback:599.5] Out: 425 [Urine:425] Intake/Output this shift: No intake/output data  recorded.  Examination: General exam: AAOx3 ,NAD, weak appearing. HEENT:Oral mucosa moist, Ear/Nose WNL grossly,dentition normal. Respiratory system: bilaterally clear,no wheezing or crackles,no use of accessory muscle, non tender. Cardiovascular system: S1 & S2 +, regular, No JVD. Gastrointestinal system: Abdomen soft, NT,ND, BS+. Nervous System:Alert, awake, moving extremities and grossly nonfocal Extremities: No edema, distal peripheral pulses palpable.  Skin: No rashes,no icterus. MSK: Normal muscle bulk,tone, power  Data Reviewed: I have personally reviewed following labs and imaging studies CBC: Recent Labs  Lab 07/11/20 0431 07/12/20 0955 07/13/20 0147 07/14/20 0355 07/15/20 0639  WBC 5.2 8.3 6.7 9.9 5.2  NEUTROABS 2.2 4.9 3.8 6.9 2.1  HGB 15.5 14.2 13.9 12.9* 13.7  HCT 46.3 42.5 41.6 39.2 41.0  MCV 86.1 88.5 86.8 86.7 89.3  PLT 379 357 394 430* 976*   Basic Metabolic Panel: Recent Labs  Lab 07/11/20 0431 07/11/20 1535 07/12/20 0955 07/13/20 0147 07/14/20 0355 07/15/20 7341  NA 137  --  138 137 141 138  K 4.8  --  4.7 3.9 3.7 3.9  CL 105  --  104 105 104 104  CO2 23  --  21* 22 21* 24  GLUCOSE 101*  --  87 125* 95 109*  BUN 19  --  23* _0 CREATININE 1.14  --  1.14 0.99 1.11 1.02  CALCIUM 9.2  --  9.3 9.0 9.6 9.5  MG  --  2.1 2.2 1.8  --   --   PHOS  --  4.7* 4.8* 4.4  --   --    GFR: Estimated Creatinine Clearance: 121.5 mL/min (by C-G formula based on SCr of 1.02 mg/dL). Liver Function Tests: Recent Labs  Lab 07/11/20 0431 07/11/20 0431 07/12/20 0955 07/13/20 0147 07/14/20 0355 07/15/20 0639 07/17/20 0431  AST 36  --  38 26 55* 37  --   ALT 25  --  _1 --   ALKPHOS 38  --  39 42 43 38  --   BILITOT 0.5  --  0.3 0.3 0.5 0.5  --   PROT 6.4*  --  6.9 6.1* 7.2 6.6  --   ALBUMIN 3.2*   < > 3.2* 2.9* 3.7 3.1* 3.5   < > = values in this interval not displayed.   No results for input(s): LIPASE, AMYLASE in the last 168 hours. Recent  Labs  Lab 07/10/20 1155 07/12/20 0955 07/14/20 0355  AMMONIA 60* 44* 37*   Coagulation Profile: No results for input(s): INR, PROTIME in the last 168 hours. Cardiac Enzymes: No results for input(s): CKTOTAL, CKMB, CKMBINDEX, TROPONINI in the last 168 hours. BNP (last 3 results) No results for input(s): PROBNP in the last 8760 hours. HbA1C: No results for input(s): HGBA1C in the last 72 hours. CBG: Recent Labs  Lab 07/14/20 2326 07/15/20 0334 07/16/20 0736 07/16/20 1141 07/16/20 1552  GLUCAP 105* 106* 102* 127* 101*   Lipid Profile: No results for input(s): CHOL, HDL, LDLCALC, TRIG, CHOLHDL, LDLDIRECT in the last 72 hours. Thyroid Function Tests: No results for input(s): TSH, T4TOTAL, FREET4, T3FREE, THYROIDAB in the last 72 hours. Anemia Panel: No results for input(s): VITAMINB12, FOLATE, FERRITIN, TIBC, IRON, RETICCTPCT in the last 72 hours. Sepsis Labs: No results for input(s): PROCALCITON, LATICACIDVEN in the last 168 hours.  Recent Results (from the past 240 hour(s))  CSF culture     Status: None   Collection Time: 07/11/20  4:08 PM   Specimen: CSF; Cerebrospinal Fluid  Result Value Ref Range Status   Specimen Description CSF  Final   Special Requests NONE  Final   Gram Stain   Final    WBC PRESENT, PREDOMINANTLY MONONUCLEAR NO ORGANISMS SEEN    Culture   Final    NO GROWTH 3 DAYS Performed at [facility], [address], [place], [place] [address]    Report Status 07/15/2020 FINAL  Final     Radiology Studies: No results found.   LOS: 16 days   Antonieta Pert, MD [facility]  07/17/2020, 8:16 AM

## 2020-07-17 NOTE — Progress Notes (Signed)
Hydralazine administered for bp 176/108.  Rechecked BP 167/97. Patient resting comfortably this time no signs of distress.

## 2020-07-17 NOTE — Progress Notes (Signed)
MD Dayna Barker, notified of patient last BP 160/94. No PRN med in pt MAR.  Awaiting orders.

## 2020-07-18 MED ORDER — LOSARTAN POTASSIUM 50 MG PO TABS
100.0000 mg | ORAL_TABLET | Freq: Every day | ORAL | Status: DC
  Administered 2020-07-18 – 2020-07-20 (×3): 100 mg via ORAL
  Filled 2020-07-18 (×3): qty 2

## 2020-07-18 MED ORDER — MELATONIN 3 MG PO TABS
3.0000 mg | ORAL_TABLET | Freq: Once | ORAL | Status: AC
  Administered 2020-07-18: 3 mg via ORAL
  Filled 2020-07-18: qty 1

## 2020-07-18 MED ORDER — LOSARTAN POTASSIUM 100 MG PO TABS
100.0000 mg | ORAL_TABLET | Freq: Every day | ORAL | Status: DC
Start: 2020-07-19 — End: 2020-07-20

## 2020-07-18 MED ORDER — LACTULOSE 10 GM/15ML PO SOLN
30.0000 g | Freq: Every day | ORAL | 0 refills | Status: DC
Start: 2020-07-19 — End: 2020-07-20

## 2020-07-18 MED ORDER — ALPRAZOLAM 2 MG PO TABS: 2.0000 mg | ORAL_TABLET | Freq: Three times a day (TID) | ORAL | 0 refills | Status: DC | PRN

## 2020-07-18 MED ORDER — PALIPERIDONE ER 6 MG PO TB24: 6.0000 mg | ORAL_TABLET | Freq: Every day | ORAL | Status: DC

## 2020-07-18 MED ORDER — ENSURE ENLIVE PO LIQD
237.0000 mL | Freq: Three times a day (TID) | ORAL | Status: DC
  Administered 2020-07-18 – 2020-07-20 (×5): 237 mL via ORAL

## 2020-07-18 MED ORDER — ESCITALOPRAM OXALATE 5 MG PO TABS
10.0000 mg | ORAL_TABLET | Freq: Every day | ORAL | Status: DC
Start: 2020-07-18 — End: 2020-07-19

## 2020-07-18 MED ORDER — BUSPIRONE HCL 10 MG PO TABS: 10.0000 mg | ORAL_TABLET | Freq: Two times a day (BID) | ORAL | Status: DC

## 2020-07-18 MED ORDER — FAMOTIDINE 20 MG PO TABS
20.0000 mg | ORAL_TABLET | Freq: Two times a day (BID) | ORAL | Status: DC
Start: 2020-07-18 — End: 2020-07-20

## 2020-07-18 MED ORDER — APTIOM 800 MG PO TABS: 1600.0000 mg | ORAL_TABLET | Freq: Every day | ORAL | Status: DC

## 2020-07-18 NOTE — Progress Notes (Signed)
Occupational Therapy Treatment Patient Details Name: Dylan Moon MRN: [id number] DOB: 05-Aug-1996 Today's Date: 07/18/2020    History of present illness 24 y.o male presenting with seizure that had 30 min of uninterrupted seizure that was not resolved with benzodiazepines . He was given further benzodiazepines on arrival in the department of emergency medicine however he subsequently became apneic and required intubation. Having extreme agitation along course. PMH includes seizure history, ADHD. Psych following.   OT comments  Pt making steady progress towards OT goals this session. Pt continues to present with impaired balance, decreased safety awareness and some cognitive impairments most notably in the areas of problem solving and safety/ judgement. Pt with elevated this session 153/97 HR 127 bpm after standing at sink for oral care ~ 5 mins. Deferred further mobility d/t hypertension. Overall, pt requires supervision for UB and LB ADLs and minguard for household distance functional mobility with no AD. Pt would continue to benefit from skilled occupational therapy while admitted and after d/c to address the below listed limitations in order to improve overall functional mobility and facilitate independence with BADL participation. DC plan remains appropriate, will follow acutely per POC.     Follow Up Recommendations  Outpatient OT;Other (comment) (neuro)    Equipment Recommendations  None recommended by OT    Recommendations for Other Services      Precautions / Restrictions Precautions Precautions: Fall Restrictions Weight Bearing Restrictions: No       Mobility Bed Mobility Overal bed mobility: Needs Assistance Bed Mobility: Supine to Sit     Supine to sit: Supervision;HOB elevated     General bed mobility comments: for safety, cuing for sequencing task and cues for safety awareness as pt trying to impulsively get OOB with rails up  Transfers Overall transfer level:  Needs assistance Equipment used: None Transfers: Sit to/from Stand Sit to Stand: Min guard         General transfer comment: min guard for safety    Balance Overall balance assessment: Needs assistance Sitting-balance support: No upper extremity supported;Feet supported Sitting balance-Leahy Scale: Fair     Standing balance support: Single extremity supported;During functional activity Standing balance-Leahy Scale: Fair Standing balance comment: overall fair balance, intermittent min guard for safety during transitions                           ADL either performed or assessed with clinical judgement   ADL Overall ADL's : Needs assistance/impaired     Grooming: Oral care;Wash/dry face;Standing;Supervision/safety Grooming Details (indicate cue type and reason): gross supervision while standing at sink with no AD             Lower Body Dressing: Supervision/safety;Sitting/lateral leans Lower Body Dressing Details (indicate cue type and reason): able to don socks from EOB with no LOB Toilet Transfer: Min guard;Supervision/safety;Ambulation Toilet Transfer Details (indicate cue type and reason): simulated via functional mobility with no AD; gross supervision- min guard for safety         Functional mobility during ADLs: Min guard;Supervision/safety General ADL Comments: pt slightly impulsive during session noted to try to get OOB while rails still up.pt required cues for safety awareness throughout session. pt reports no changes in stregnth or balance although sitter reported that pt nearly fell in shower yesterday     Vision Patient Visual Report: No change from baseline Vision Assessment?: No apparent visual deficits   Perception     Praxis  Cognition Arousal/Alertness: Awake/alert Behavior During Therapy: WFL for tasks assessed/performed;Impulsive Overall Cognitive Status: No family/caregiver present to determine baseline cognitive  functioning Area of Impairment: Problem solving;Safety/judgement                         Safety/Judgement: Decreased awareness of deficits;Decreased awareness of safety   Problem Solving: Slow processing;Requires tactile cues;Difficulty sequencing;Requires verbal cues General Comments: pt continues to deny any functional changes although NT reported near fall yesterday in shower. Pt fixated on wanting to see psych and wanting to DC home.        Exercises     Shoulder Instructions       General Comments BP after standing at sink to brush teeth ~ 5 mins 153/97 HR max 127 bpm while standing at sink. Issued pt word searches and cross word puzzles upon request from sitter. 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     Pertinent Vitals/ Pain       Pain Assessment: No/denies pain  Home Living                                          Prior Functioning/Environment              Frequency  Min 2X/week        Progress Toward Goals  OT Goals(current goals can now be found in the care plan section)  Progress towards OT goals: Progressing toward goals  Acute Rehab OT Goals Patient Stated Goal: return home soon OT Goal Formulation: With patient Time For Goal Achievement: 07/28/20 Potential to Achieve Goals: Good  Plan Discharge plan remains appropriate;Frequency remains appropriate    Co-evaluation                 AM-PAC OT "6 Clicks" Daily Activity     Outcome Measure   Help from another person eating meals?: None Help from another person taking care of personal grooming?: None Help from another person toileting, which includes using toliet, bedpan, or urinal?: A Little Help from another person bathing (including washing, rinsing, drying)?: A Little Help from another person to put on and taking off regular upper body clothing?: None Help from another person to put on and  taking off regular lower body clothing?: None 6 Click Score: 22    End of Session Equipment Utilized During Treatment: Gait belt  OT Visit Diagnosis: Other abnormalities of gait and mobility (R26.89);Other symptoms and signs involving the nervous system (R29.898)   Activity Tolerance Other (comment) (defer further mobility d/t hypertension)   Patient Left in chair;with call bell/phone within reach;with nursing/sitter in room   Nurse Communication Mobility status;Other (comment) (hypertension)        Time: 7001-7494 OT Time Calculation (min): 23 min  Charges: OT General Charges $OT Visit: 1 Visit OT Treatments $Self Care/Home Management : 23-37 mins  .Audery Amel., COTA/L Acute Rehabilitation Services [phone]    Angelina Pih 07/18/2020, 9:11 AM

## 2020-07-18 NOTE — Progress Notes (Addendum)
CSW noting discharge order to [facility] and consult placed to [facility], patient medically stable for inpatient psych at this time. CSW sent referral to disposition, awaiting response. Patient is under IVC.   Blenda Nicely, [place] Clinical Social Worker [phone]   UPDATE 3:30 PM: Patient referral was declined by [facility], they do not feel he is appropriate. Suggested contacting [facility] and faxing out referral, but may be a [facility] candidate. CSW attempted to contact [facility] several times to provide referral, received no answer to phone call. CSW to fax out referral to other psychiatric hospitals to find a bed available for patient.  Blenda Nicely, [place] Clinical Social Worker [phone]

## 2020-07-18 NOTE — Progress Notes (Signed)
Nutrition Follow-up  DOCUMENTATION CODES:   Not applicable  INTERVENTION:  Ensure Enlive po TID, each supplement provides 350 kcal and 20 grams of protein   NUTRITION DIAGNOSIS:   Inadequate oral intake related to inability to eat as evidenced by NPO status.  Progressing, diet advanced  GOAL:   Patient will meet greater than or equal to 90% of their needs  Progressing   MONITOR:   PO intake, Supplement acceptance, Weight trends  REASON FOR ASSESSMENT:   Ventilator, Consult Enteral/tube feeding initiation and management  ASSESSMENT:   Pt admitted with breakthrough seizures complicated by status epilepticus. PMH includes seizure disorder, ADHD, and scoliosis.  10/09 Intubated 10/11 Cortrak  10/13 Extubated 10/15 Cortrak removed 10/16 Diet advanced to Regular  10/19 intubated; LP to r/o meningitis, no significant WBC or RBC 10/20 CT abdomen/pelvis showed colonic ileus 10/21 extubated 10/23 diet advanced to regular  Pt very anxious to leave hospital. Pt continues to have 1:1 sitter and is being followed by Psychiatry under IVC. Per MD, there is concern for new schizophrenia given visualized sensation and severe agitation. [name] colonic ileus appears resolved. Per MD, pt will be able to d/c once blood pressure is stable.   PO Intake: 0-85% x 3 recorded meals (30% average meal intake). Pt had previously been receiving Ensure Enlive TID but has not had them reordered since being extubated again. Will re-order supplements to provide pt with additional kcals/protein.   UOP: x24 hours  Labs reviewed.  Medications: pepcid, chronulac  Diet Order:   Diet Order            Diet regular Room service appropriate? Yes; Fluid consistency: Thin  Diet effective now                 EDUCATION NEEDS:   Not appropriate for education at this time  Skin:  Skin Assessment: Reviewed RN Assessment  Last BM:  10/24  Height:   Ht Readings from Last 1 Encounters:   07/11/20 6' (1.829 m)    Weight:   Wt Readings from Last 1 Encounters:  07/18/20 75.8 kg    BMI:  Body mass index is 22.66 kg/m.  Estimated Nutritional Needs:   Kcal:  2200-2400  Protein:  110-120 grams  Fluid:  >/= 2.2 L/day    Eugene Gavia, MS, RD, LDN RD pager number and weekend/on-call pager number located in Amion.

## 2020-07-18 NOTE — Discharge Summary (Signed)
Physician Discharge Summary  Dylan Moon ZOX:[id number] DOB: 01-25-1996 DOA: 07/01/2020  PCP: [facility]  Admit date: 07/01/2020 Discharge date: 07/18/2020  Admitted From: home Disposition: Inpatient psychiatry.  Recommendations for Outpatient Follow-up:  1. Follow up with PCP in 1-2 weeks 2. Please obtain BMP/CBC in one week 3. Please follow up on the following pending results:  Home Health:no  Equipment/Devices: none  Discharge Condition: Stable Code Status:   Code Status: Full Code Diet recommendation:  Diet Order            Diet - low sodium heart healthy           Diet regular Room service appropriate? Yes; Fluid consistency: Thin  Diet effective now                  Brief/Interim Summary: 24 year old male with history of seizure disorder stable status ADHD who had seizure on the morning of 07/01/2020 for 30 minutes are interrupted given benzodiazepine by EMS but did not resolve and in the ED was apneic needing intubation was loaded with AEDs, was admitted to ICU, by neurology, PCCM, extubated 10/13.  Hospital course was complicated with needing intubation 10/19 and subsequently extubated 10/21.  Work-up with MRI brain no acute finding, LP no significant WBC or RBC, CT abdomen pelvis clinic ileus RLL infiltrate. Is followed by psychiatry currently in IVC and awaiting for transfer to inpatient psychiatry, he was transferred under [facility] service 10/25 while he is waiting for placement   Discharge Diagnoses:   Status epilepticus: No further seizures while on medication.  He was seen by neurology underwent extensive work-up as above with LP, MRI brain.  Patient reports she was not compliant with this AEDs, and moving forward he reports he will be.  Continue  Eslicarbazepine, valproate for seizures.  Hypertension blood pressure borderline controlled, increase losartan to 100 mg continue to follow-up with PCP.   Adjustment disorder/Behavioral  disturbance/suicidal ideation: concern for new schizophrenia given visualized sensation and severe agitation:Followed by psychiatry under IVC.do not suspect substance abuse playing a role.He is on one-to-one.  Seen by psychiatry will continue  paliperidone 6 mg daily, Lexapro 10 mg daily BuSpar 10 mg twice daily Xanax 2 mg 3 times daily as needed and he will need inpatient psychiatric admission.   Hyperammonemia improved, newly seen and patient on Depakote without symptoms  RLL pneumonia resolved, is off antibiotics  Colonic Ileus appears resolved  Acute respiratory failure inability to protect airway in the setting of needing procedure 10/19 resolved.  Bradycardia in the setting of Precedex use.  Consults:  pccm  psych  Subjective: Alert awake resting comfortably.  Looking towards discharge.  Discharge Exam: Vitals:   07/18/20 0400 07/18/20 0900  BP: (!) 163/55 (!) 153/97  Pulse: 88   Resp: 20 20  Temp: 98.2 F (36.8 C) 98.7 F (37.1 C)  SpO2: 100%    General: Pt is alert, awake, not in acute distress Cardiovascular: RRR, S1/S2 +, no rubs, no gallops Respiratory: CTA bilaterally, no wheezing, no rhonchi Abdominal: Soft, NT, ND, bowel sounds + Extremities: no edema, no cyanosis  Discharge Instructions  Discharge Instructions    Diet - low sodium heart healthy   Complete by: As directed    Discharge instructions   Complete by: As directed    Please call call MD or return to ER for similar or worsening recurring problem that brought you to hospital or if any fever,nausea/vomiting,abdominal pain, uncontrolled pain, chest pain,  shortness of breath  or any other alarming symptoms.  Take your seizure medication as prescribed, follow-up with your neurologist and psychiatrist.  Please follow-up your doctor as instructed in a week time and call the office for appointment.  Please avoid alcohol, smoking, or any other illicit substance and maintain healthy habits  including taking your regular medications as prescribed.  You were cared for by a hospitalist during your hospital stay. If you have any questions about your discharge medications or the care you received while you were in the hospital after you are discharged, you can call the unit and ask to speak with the hospitalist on call if the hospitalist that took care of you is not available.  Once you are discharged, your primary care physician will handle any further medical issues. Please note that NO REFILLS for any discharge medications will be authorized once you are discharged, as it is imperative that you return to your primary care physician (or establish a relationship with a primary care physician if you do not have one) for your aftercare needs so that they can reassess your need for medications and monitor your lab values   Increase activity slowly   Complete by: As directed      Allergies as of 07/18/2020      Reactions   Keppra [levetiracetam] Other (See Comments)   Irritability   Tramadol Other (See Comments)   Contraindicated with current medications (??)   Vimpat [lacosamide] Other (See Comments)   Causes anger      Medication List    TAKE these medications   alprazolam 2 MG tablet Commonly known as: XANAX Take 1 tablet (2 mg total) by mouth 3 (three) times daily as needed for anxiety. What changed:   medication strength  how much to take  when to take this   Aptiom 800 MG Tabs Generic drug: Eslicarbazepine Acetate Take 1,600 mg by mouth daily.   busPIRone 10 MG tablet Commonly known as: BUSPAR Take 1 tablet (10 mg total) by mouth 2 (two) times daily. What changed:   medication strength  how much to take   divalproex 500 MG 24 hr tablet Commonly known as: DEPAKOTE ER Take 3,000 mg by mouth daily.   escitalopram 5 MG tablet Commonly known as: LEXAPRO Take 2 tablets (10 mg total) by mouth at bedtime. What changed: how much to take   famotidine 20 MG  tablet Commonly known as: PEPCID Take 1 tablet (20 mg total) by mouth 2 (two) times daily.   lactulose 10 GM/15ML solution Commonly known as: CHRONULAC Take 45 mLs (30 g total) by mouth daily. Start taking on: July 19, 2020   losartan 100 MG tablet Commonly known as: COZAAR Take 1 tablet (100 mg total) by mouth daily. Start taking on: July 19, 2020   paliperidone 6 MG 24 hr tablet Commonly known as: INVEGA Take 1 tablet (6 mg total) by mouth daily. Start taking on: July 19, 2020   Vitamin D (Ergocalciferol) 1.25 MG (50000 UNIT) Caps capsule Commonly known as: DRISDOL Take 50,000 Units by mouth every Monday.       Allergies  Allergen Reactions  . Keppra [Levetiracetam] Other (See Comments)    Irritability   . Tramadol Other (See Comments)    Contraindicated with current medications (??)  . Vimpat [Lacosamide] Other (See Comments)    Causes anger    The results of significant diagnostics from this hospitalization (including imaging, microbiology, ancillary and laboratory) are listed below for reference.    Microbiology: Recent  Results (from the past 240 hour(s))  CSF culture     Status: None   Collection Time: 07/11/20  4:08 PM   Specimen: CSF; Cerebrospinal Fluid  Result Value Ref Range Status   Specimen Description CSF  Final   Special Requests NONE  Final   Gram Stain   Final    WBC PRESENT, PREDOMINANTLY MONONUCLEAR NO ORGANISMS SEEN    Culture   Final    NO GROWTH 3 DAYS Performed at [facility], [address] [place], [place] [address]    Report Status 07/15/2020 FINAL  Final    Procedures/Studies: EEG  Result Date: 07/12/2020 Charlsie QuestYadav, Priyanka O, MD     07/12/2020  5:28 PM Patient Name: Gabriel CarinaChance E Shambley MRN: [id number] Epilepsy Attending: Charlsie QuestPriyanka O Yadav Referring Physician/Provider: Dr Lindie SprucePriyanka Yadav Date: 07/12/2020 Duration: 23.23 mins  Patient history: 24yo M with h/o epilepsy who presented with breakthrough seizure. EEG to  evaluate for seizure  Level of alertness:  Comatose  AEDs during EEG study: VPA,  propofol  Technical aspects: This EEG study was done with scalp electrodes positioned according to the 10-20 International system of electrode placement. Electrical activity was acquired at a sampling rate of 500Hz  and reviewed with a high frequency filter of 70Hz  and a low frequency filter of 1Hz . EEG data were recorded continuously and digitally stored.  Description:  EEG showed continuous generalized 3-6Hz  theta-delta slowing. Hyperventilation and photic stimulation were not performed.    ABNORMALITY -Continuous slow, generalized  IMPRESSION: This study is suggestive of severe diffuse encephalopathy, nonspecific etiology but likely related to sedation. No seizures or epileptiform discharges were seen throughout the recording.   Charlsie Questriyanka O Yadav   CT Head Wo Contrast  Result Date: 07/01/2020 CLINICAL DATA:  Seizure. EXAM: CT HEAD WITHOUT CONTRAST TECHNIQUE: Contiguous axial images were obtained from the base of the skull through the vertex without intravenous contrast. COMPARISON:  December 24, 2019 FINDINGS: Brain: No evidence of acute infarction, hemorrhage, hydrocephalus, extra-axial collection or mass lesion/mass effect. Vascular: No hyperdense vessel or unexpected calcification. Skull: Normal. Negative for fracture or focal lesion. Sinuses/Orbits: No acute finding. Other: None. IMPRESSION: No focal acute intracranial abnormality identified. Electronically Signed   By: Sherian ReinWei-Chen  Lin M.D.   On: 07/01/2020 13:37   MR BRAIN W WO CONTRAST  Result Date: 07/12/2020 CLINICAL DATA:  24 year old male with altered mental status, seizure, suspicion of CNS infection. EXAM: MRI HEAD WITHOUT AND WITH CONTRAST TECHNIQUE: Multiplanar, multiecho pulse sequences of the brain and surrounding structures were obtained without and with intravenous contrast. CONTRAST:  9mL GADAVIST GADOBUTROL 1 MMOL/ML IV SOLN COMPARISON:  Head CT  07/01/2020.  Brain MRI 01/03/2018 and earlier. 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 This is stable since 2018. No other heterotopia or migrational abnormality identified. Thin slice coronal images demonstrate a relatively stable and symmetric appearance of the hippocampal formations. No features of mesial temporal sclerosis. No restricted diffusion to suggest acute infarction. No midline shift, mass effect, evidence of mass lesion, ventriculomegaly, extra-axial collection or acute intracranial hemorrhage. Cervicomedullary junction and pituitary are within normal limits. No cerebral edema identified. No encephalomalacia. No chronic cerebral blood products. No abnormal enhancement identified.  No dural thickening. Vascular: Major intracranial vascular flow voids are stable since 2019. the major dural venous sinuses are enhancing and appear to be patent.  Skull and upper cervical spine: Negative. Negative visible cervical spine and spinal cord. Sinuses/Orbits: Negative orbits. The patient is intubated. Fluid or mucosal thickening in both sphenoid sinuses and posterior right ethmoid air cell is increased since 2019. Other: Fluid in the pharynx associated with intubation. Mild associated bilateral mastoid effusions. IMPRESSION: 1. Negative for acute intracranial abnormality, but positive for a small left-side subependymal gray matter heterotopia, stable since 2018. No other migrational abnormality identified. No evidence of mesial temporal sclerosis. 2. Intubated, with associated fluid in the pharynx, mastoid and paranasal sinus effusions. Electronically Signed   By: Odessa Fleming M.D.   On: 07/12/2020 03:47   CT ABDOMEN PELVIS W CONTRAST  Result Date: 07/12/2020 CLINICAL DATA:  Colonic distension on recent plain film, possible obstruction  EXAM: CT ABDOMEN AND PELVIS WITH CONTRAST TECHNIQUE: Multidetector CT imaging of the abdomen and pelvis was performed using the standard protocol following bolus administration of intravenous contrast. CONTRAST:  OMNIPAQUE IOHEXOL 300 MG/ML  SOLN COMPARISON:  Plain film from earlier in the same day FINDINGS: Lower chest: Lung bases demonstrate consolidation in the right lower lobe with multiple air bronchograms consistent with pneumonia. Left basilar atelectasis is noted. There is a somewhat nodular density identified on image number 33 of series 5. This is likely related to the adjacent atelectasis. Hepatobiliary: Gallbladder is within normal limits. Liver is homogeneous in attenuation without focal mass. If small area of increased enhancement is noted in the lateral segment of the left lobe of the liver consistent with small hemangioma. Pancreas: Unremarkable. No pancreatic ductal dilatation or surrounding inflammatory changes. Spleen: Normal in size without focal abnormality. Adrenals/Urinary Tract: Adrenal glands are within normal limits. Kidneys show normal enhancement pattern. No renal calculi or obstructive changes are noted. The bladder is well distended. Stomach/Bowel: Colon again demonstrates gaseous distension similar to that seen on prior plain film examination. Scattered fecal material and fluid is noted throughout the colon. No obstructing lesion is noted. Areas of spasm are seen in the colon. The appendix is within normal limits. Small bowel shows no dilatation. Stomach is within normal limits. Gastric catheter is noted extending into the stomach. Vascular/Lymphatic: No significant vascular findings are present. No enlarged abdominal or pelvic lymph nodes. Reproductive: Prostate is unremarkable. Other: No abdominal wall hernia or abnormality. No abdominopelvic ascites. Musculoskeletal: No acute or significant osseous findings. IMPRESSION: Gaseous distension of the colon with fecal material and  fluid within similar to that seen on recent plain film examination. No obstructing lesion is seen. This likely represents a diffuse colonic ileus. Correlation with physical exam is recommended. Changes in the lung bases right greater than left consistent with multifocal pneumonia. A somewhat nodular area is noted in the left base although likely related to the underlying inflammatory change. Small hemangioma in the left lobe of the liver. Electronically Signed   By: Alcide Clever M.D.   On: 07/12/2020 21:10   DG CHEST PORT 1 VIEW  Result Date: 07/11/2020 CLINICAL DATA:  Endotracheal tube placement. EXAM: PORTABLE CHEST 1 VIEW COMPARISON:  July 08, 2020 FINDINGS: Since the prior study there is been interval placement of an endotracheal tube. Its distal tip is seen approximately 3.3 cm from the carina. Interval nasogastric tube placement is also noted with its distal end overlying the expected region of the body of the stomach. Mild right basilar infiltrate is seen which is mildly decreased in severity when compared to the prior study. There is no evidence of a pleural effusion or pneumothorax. The heart size  and mediastinal contours are within normal limits. The visualized skeletal structures are unremarkable. IMPRESSION: 1. Endotracheal and nasogastric tube placement and positioning, as described above. 2. Mild right basilar infiltrate, decreased in severity when compared to the prior study. Electronically Signed   By: Aram Candela M.D.   On: 07/11/2020 18:48   DG Chest Port 1 View  Result Date: 07/08/2020 CLINICAL DATA:  Pneumonia. EXAM: PORTABLE CHEST 1 VIEW COMPARISON:  July 06, 2020 FINDINGS: Infiltrate remains in the right base, similar in the interval. The heart, hila, and mediastinum are unchanged. No pneumothorax. No other suspicious findings. IMPRESSION: Persistent right lower lobe infiltrate, likely pneumonia given history. Recommend short-term follow-up imaging to ensure resolution.  Electronically Signed   By: Gerome Sam III M.D   On: 07/08/2020 10:08   DG CHEST PORT 1 VIEW  Result Date: 07/06/2020 CLINICAL DATA:  Pneumonia EXAM: PORTABLE CHEST 1 VIEW COMPARISON:  July 01, 2020 FINDINGS: Endotracheal tube and nasogastric tube have been removed. Enteric feeding catheter tip is incompletely visualized but appears near the pylorus. Shallow degree of inspiration. There is airspace opacity in the right lower lung region. Lungs elsewhere are clear. Heart is upper normal in size with pulmonary vascularity normal. No adenopathy. No bone lesions. IMPRESSION: Right lower lobe airspace opacity consistent with pneumonia. Lungs elsewhere clear. Stable cardiac silhouette. Enteric tube tip near pylorus, incompletely visualized. Electronically Signed   By: Bretta Bang III M.D.   On: 07/06/2020 09:03   DG Chest Port 1 View  Result Date: 07/01/2020 CLINICAL DATA:  Seizure. EXAM: PORTABLE CHEST 1 VIEW COMPARISON:  Chest radiograph 12/27/2019. FINDINGS: ETT tip projects at the carina, recommend retraction. Enteric tube tip and side-port project over the stomach. Monitoring leads overlie the patient. Stable cardiac and mediastinal contours. Low lung volumes. Bibasilar heterogeneous opacities. No pleural effusion or pneumothorax. Thoracic spine degenerative changes. IMPRESSION: ET tube terminates at the carina, recommend retraction. Low lung volumes with bibasilar opacities favored represent atelectasis. These results will be called to the ordering clinician or representative by the Radiologist Assistant, and communication documented in the PACS or [employer]. Electronically Signed   By: Annia Belt M.D.   On: 07/01/2020 12:02   DG Abd Portable 1V  Result Date: 07/14/2020 CLINICAL DATA:  Colonic distention EXAM: PORTABLE ABDOMEN - 1 VIEW COMPARISON:  07/12/2020 FINDINGS: Dilated gas-filled small and large bowel likely representing adynamic ileus. The enteric tube is been removed in  the interval. No radiopaque stones. Visualized bones appear intact. IMPRESSION: Dilated gas-filled small and large bowel likely representing adynamic ileus. Electronically Signed   By: Burman Nieves M.D.   On: 07/14/2020 05:41   DG Abd Portable 1V  Result Date: 07/12/2020 CLINICAL DATA:  Profound vomiting.  No bowel movement in days. EXAM: PORTABLE ABDOMEN - 1 VIEW COMPARISON:  Chest x-ray 07/11/2020.  Abdomen 12/26/2019. FINDINGS: NG tube noted with tip over the stomach. Colonic distention up to 8 cm noted colonic obstruction or colonic ileus could present this fashion. Follow-up exam suggested to demonstrate resolution. Stool in the colon. No free air. Thoracolumbar spine degenerative changes scoliosis. IMPRESSION: 1. NG tube noted with tip over the stomach. 2. Colonic distention up to 8 cm noted colonic obstruction or colonic ileus could present this fashion. Follow-up exam suggested to demonstrate resolution. Electronically Signed   By: Maisie Fus  Register   On: 07/12/2020 14:19   DG Abd Portable 1 View  Result Date: 07/01/2020 CLINICAL DATA:  Verify OG tube placement. EXAM: PORTABLE ABDOMEN - 1 VIEW  COMPARISON:  Chest radiograph-earlier same day FINDINGS: When correlated with preceding chest radiograph, enteric tube tip and side port project over the expected location of the gastric fundus. Paucity of bowel gas without evidence of enteric obstruction Nondiagnostic evaluation for pneumoperitoneum secondary to supine positioning and exclusion of the lower thorax. No pneumatosis or portal venous gas. No acute osseous abnormalities IMPRESSION: Enteric tube tip and side port project over the expected location the gastric fundus Electronically Signed   By: Simonne Come M.D.   On: 07/01/2020 12:27   EEG adult  Result Date: 07/01/2020 Charlsie Quest, MD     07/02/2020  8:07 AM Patient Name: JOSSIAH SMOAK MRN: [id number] Epilepsy Attending: Charlsie Quest Referring Physician/Provider: Dr Nilda Simmer  Date: 07/01/2020 Duration: 27.14 mins Patient history: 24yo M with h/o epilepsy who presented with breakthrough seizure. EEG to evaluate for seizure Level of alertness:  Comatose AEDs during EEG study: VPA, versed, propofol Technical aspects: This EEG study was done with scalp electrodes positioned according to the 10-20 International system of electrode placement. Electrical activity was acquired at a sampling rate of 500Hz  and reviewed with a high frequency filter of 70Hz  and a low frequency filter of 1Hz . EEG data were recorded continuously and digitally stored. Description:  EEG showed continuous generalized 2-3Hz  delta slowing with overriding 15 to 18 Hz beta activity distributed symmetrically and diffusely. Hyperventilation and photic stimulation were not performed.   ABNORMALITY -Continuous slow, generalized IMPRESSION: This study is suggestive of severe diffuse encephalopathy, nonspecific etiology but likely related to sedation. No seizures or epileptiform discharges were seen throughout the recording. Priyanka   Overnight EEG with video  Result Date: 07/02/2020 , MD     07/03/2020  5:05 PM Patient Name: ROWLAND ERICSSON MRN: Charlsie Quest Epilepsy Attending: 09/02/2020 Referring Physician/Provider: Dr Gabriel Carina Duration: 07/02/2020 1408 to 07/03/2020 1408  Patient history: 24yo M with h/o epilepsy who presented with breakthrough seizure. EEG to evaluate for seizure  Level of alertness:  Comatose  AEDs during EEG study: VPA, Aptiom, versed, propofol  Technical aspects: This EEG study was done with scalp electrodes positioned according to the 10-20 International system of electrode placement. Electrical activity was acquired at a sampling rate of 500Hz  and reviewed with a high frequency filter of 70Hz  and a low frequency filter of 1Hz . EEG data were recorded continuously and digitally stored.  Description:  EEG showed continuous generalized 2-3Hz  delta slowing with  overriding 15 to 18 Hz beta activity distributed symmetrically and diffusely. Hyperventilation and photic stimulation were not performed.   Event button was pressed on 07/02/2020 at 2054, 2116 and 2128. On video, patient was noted to have brief twitching. Concomitant eeg before, during and after the event didn't show any eeg change.    ABNORMALITY -Continuous slow, generalized  IMPRESSION: This study is suggestive of severe diffuse encephalopathy, nonspecific etiology but likely related to sedation. No seizures or epileptiform discharges were seen throughout the recording. Event button was pressed as described above without concomitant eeg change and was not epileptic.   Priyanka O Yadav    Labs: BNP (last 3 results) Recent Labs    12/24/19 1046  BNP 76.0   Basic Metabolic Panel: Recent Labs  Lab 07/11/20 1535 07/12/20 0955 07/13/20 0147 07/14/20 0355 07/15/20 0639  NA  --  138 137 141 138  K  --  4.7 3.9 3.7 3.9  CL  --  104 105 104 104  CO2  --  21* 22 21* 24  GLUCOSE  --  87 125* 95 109*  BUN  --  23* 17 7 10   CREATININE  --  1.14 0.99 1.11 1.02  CALCIUM  --  9.3 9.0 9.6 9.5  MG 2.1 2.2 1.8  --   --   PHOS 4.7* 4.8* 4.4  --   --    Liver Function Tests: Recent Labs  Lab 07/12/20 0955 07/13/20 0147 07/14/20 0355 07/15/20 0639 07/17/20 0431  AST 38 26 55* 37  --   ALT 31 25 28 26   --   ALKPHOS 39 42 43 38  --   BILITOT 0.3 0.3 0.5 0.5  --   PROT 6.9 6.1* 7.2 6.6  --   ALBUMIN 3.2* 2.9* 3.7 3.1* 3.5   No results for input(s): LIPASE, AMYLASE in the last 168 hours. Recent Labs  Lab 07/12/20 0955 07/14/20 0355  AMMONIA 44* 37*   CBC: Recent Labs  Lab 07/12/20 0955 07/13/20 0147 07/14/20 0355 07/15/20 0639  WBC 8.3 6.7 9.9 5.2  NEUTROABS 4.9 3.8 6.9 2.1  HGB 14.2 13.9 12.9* 13.7  HCT 42.5 41.6 39.2 41.0  MCV 88.5 86.8 86.7 89.3  PLT 357 394 430* 409*   Cardiac Enzymes: No results for input(s): CKTOTAL, CKMB, CKMBINDEX, TROPONINI in the last 168  hours. BNP: Invalid input(s): POCBNP CBG: Recent Labs  Lab 07/14/20 2326 07/15/20 0334 07/16/20 0736 07/16/20 1141 07/16/20 1552  GLUCAP 105* 106* 102* 127* 101*   D-Dimer No results for input(s): DDIMER in the last 72 hours. Hgb A1c No results for input(s): HGBA1C in the last 72 hours. Lipid Profile No results for input(s): CHOL, HDL, LDLCALC, TRIG, CHOLHDL, LDLDIRECT in the last 72 hours. Thyroid function studies No results for input(s): TSH, T4TOTAL, T3FREE, THYROIDAB in the last 72 hours.  Invalid input(s): FREET3 Anemia work up No results for input(s): VITAMINB12, FOLATE, FERRITIN, TIBC, IRON, RETICCTPCT in the last 72 hours. Urinalysis    Component Value Date/Time   COLORURINE YELLOW 07/02/2020 1630   APPEARANCEUR CLOUDY (A) 07/02/2020 1630   LABSPEC 1.033 (H) 07/02/2020 1630   PHURINE 5.0 07/02/2020 1630   GLUCOSEU NEGATIVE 07/02/2020 1630   HGBUR NEGATIVE 07/02/2020 1630   BILIRUBINUR NEGATIVE 07/02/2020 1630   KETONESUR NEGATIVE 07/02/2020 1630   PROTEINUR NEGATIVE 07/02/2020 1630   NITRITE NEGATIVE 07/02/2020 1630   LEUKOCYTESUR NEGATIVE 07/02/2020 1630   Sepsis Labs Invalid input(s): PROCALCITONIN,  WBC,  LACTICIDVEN Microbiology Recent Results (from the past 240 hour(s))  CSF culture     Status: None   Collection Time: 07/11/20  4:08 PM   Specimen: CSF; Cerebrospinal Fluid  Result Value Ref Range Status   Specimen Description CSF  Final   Special Requests NONE  Final   Gram Stain   Final    WBC PRESENT, PREDOMINANTLY MONONUCLEAR NO ORGANISMS SEEN    Culture   Final    NO GROWTH 3 DAYS Performed at [facility], [address] [place], [address] [place]    Report Status 07/15/2020 FINAL  Final     Time coordinating discharge: 25  minutes  SIGNED: [id number], MD  [facility] 07/18/2020, 11:26 AM  If 7PM-7AM, please contact night-coverage [email or web address]

## 2020-07-19 MED ORDER — DIVALPROEX SODIUM ER 500 MG PO TB24
2000.0000 mg | ORAL_TABLET | Freq: Every day | ORAL | Status: DC
  Administered 2020-07-19: 2000 mg via ORAL
  Filled 2020-07-19 (×2): qty 4

## 2020-07-19 MED ORDER — AMLODIPINE BESYLATE 5 MG PO TABS: 5.0000 mg | ORAL_TABLET | Freq: Every day | ORAL | Status: DC

## 2020-07-19 MED ORDER — DIVALPROEX SODIUM ER 500 MG PO TB24: 2000.0000 mg | ORAL_TABLET | Freq: Every day | ORAL | Status: DC

## 2020-07-19 MED ORDER — CARVEDILOL 6.25 MG PO TABS
6.2500 mg | ORAL_TABLET | Freq: Two times a day (BID) | ORAL | Status: DC
  Administered 2020-07-19 – 2020-07-20 (×3): 6.25 mg via ORAL
  Filled 2020-07-19 (×3): qty 1

## 2020-07-19 MED ORDER — ESCITALOPRAM OXALATE 10 MG PO TABS
10.0000 mg | ORAL_TABLET | Freq: Every day | ORAL | 0 refills | Status: DC
Start: 2020-07-19 — End: 2022-12-08

## 2020-07-19 MED ORDER — PALIPERIDONE PALMITATE ER 234 MG/1.5ML IM SUSY
234.0000 mg | PREFILLED_SYRINGE | Freq: Once | INTRAMUSCULAR | Status: AC
  Administered 2020-07-19: 234 mg via INTRAMUSCULAR
  Filled 2020-07-19: qty 1.5

## 2020-07-19 NOTE — Consult Note (Addendum)
[facility] Face-to-Face Psychiatry Consult   Reason for Consult: ''severe behavioral abnormality requiring multiple forms of medical treatment.''   Referring Physician: Noemi Chapel, DO Patient Identification: Dylan Moon MRN:  [id number] Principal Diagnosis: Seizures Diagnosis:  Active Problems:   Adjustment disorder with mixed disturbance of emotions and conduct   Status epilepticus ([place])   Total Time spent with patient: 45 minutes  Subjective: "Oh you with psychiatry.  I need to talk to you.  I have things that I want to do.  I have an event to shoot today."    Objective : Patient was seen and case discussed with supervising physician Dr. Dwyane Dee.  On today's evaluation patient is alert and oriented, acknowledges Probation officer upon entry of the room.  He sits up appropriately, engages well, remains pleasant with improved affect.  He does present today with brighter affect, improved insight, and some improved judgment.  Patient does continue to exhibit ruminating thoughts, perseveration, and some grandiosity secondary to needing to discharge from the hospital due to events schedule.  Although on evaluation he is alert and oriented, able to state why he is in the hospital, the day he arrived, and his treatment he has received.  He also shows some insight and medication compliance, and the importance of refraining from any illicit substances.  He does admit to some impulsivity, irrational thinking and poor impulse control at time that have resulted in some legal charges.  He is open to seeking outpatient psychiatric help, and is agreeing to taking his medication.  His mother was at his bedside, also the presence of his sitter we were able to agree to a comment treatment plan to include starting long-acting injectable for stability.  Teach back method 3 was utilized during this assessment.  Patient was able to recall the important reasons for taking his medication, symptoms he has presented with, risk for not  taking his medication, and the overall wellbeing of his mental health.     Chart review reveal as follows: Did discuss with mother goal is to get patient psychiatrically stabilized, and receives services outpatient due to patient's ongoing seizure disorder that continues to complicate his overall level of care.  Past Psychiatric History: ADHD per patient.  Per mom patient has been diagnosed with bipolar, anxiety, depression.  It remains unclear if patient has a diagnosis of IDD, however she reports he was in special classes in grade school. "  They did use to pull him out of class and put him at other classes with other children."  Patient reports daily substance use of THC only, he denies any use of any synthetic substances. "  I used to smoke about 8 blunts a day however the seizures have been slow me down."   Risk to Self:  Yes Risk to Others:  none Prior Inpatient Therapy:  none Prior Outpatient Therapy:  Yes current evaluation at [facility].  Per chart review has also been evaluated at [facility] last visit March 08, 2020.  Per his chart review patient has been diagnosed with ADHD, personality disorder unspecified, adjustment disorder with mixed disturbances of emotions and conduct. Past Medical History:  Past Medical History:  Diagnosis Date  . ADHD   . Scoliosis   . Seizures ([place])    most recent 12/02/17    Past Surgical History:  Procedure Laterality Date  . NO PAST SURGERIES     Family History:  Family History  Problem Relation Age of Onset  . Diabetes Mother   .  Hypertension Mother   . Cancer Other   . Diabetes Father   . Seizures Maternal Grandfather    Family Psychiatric  History: none Social History:  Social History   Substance and Sexual Activity  Alcohol Use No     Social History   Substance and Sexual Activity  Drug Use Yes  . Types: Marijuana   Comment: Daily use of marijuana.    Social History   Socioeconomic History  . Marital  status: Single    Spouse name: Not on file  . Number of children: 0  . Years of education: HS  . Highest education level: Not on file  Occupational History  . Occupation: Horticulturist, commercial  Tobacco Use  . Smoking status: Never Smoker  . Smokeless tobacco: Never Used  Vaping Use  . Vaping Use: Never used  Substance and Sexual Activity  . Alcohol use: No  . Drug use: Yes    Types: Marijuana    Comment: Daily use of marijuana.  . Sexual activity: Not Currently  Other Topics Concern  . Not on file  Social History Narrative   Lives at home his mother.   3-4 sodas per week.   Right-handed.   Social Determinants of Health   Financial Resource Strain:   . Difficulty of Paying Living Expenses: Not on file  Food Insecurity:   . Worried About Charity fundraiser in the Last Year: Not on file  . Ran Out of Food in the Last Year: Not on file  Transportation Needs:   . Lack of Transportation (Medical): Not on file  . Lack of Transportation (Non-Medical): Not on file  Physical Activity:   . Days of Exercise per Week: Not on file  . Minutes of Exercise per Session: Not on file  Stress:   . Feeling of Stress : Not on file  Social Connections:   . Frequency of Communication with Friends and Family: Not on file  . Frequency of Social Gatherings with Friends and Family: Not on file  . Attends Religious Services: Not on file  . Active Member of Clubs or Organizations: Not on file  . Attends Archivist Meetings: Not on file  . Marital Status: Not on file   Additional Social History:    Allergies:   Allergies  Allergen Reactions  . Keppra [Levetiracetam] Other (See Comments)    Irritability   . Tramadol Other (See Comments)    Contraindicated with current medications (??)  . Vimpat [Lacosamide] Other (See Comments)    Causes anger    Labs:  No results found for this or any previous visit (from the past 41 hour(s)).  Current Facility-Administered  Medications  Medication Dose Route Frequency Provider Last Rate Last Admin  . 0.9 %  sodium chloride infusion  250 mL Intravenous Continuous Juanito Doom, MD   Held at 07/11/20 1707  . acetaminophen (TYLENOL) 160 MG/5ML solution 650 mg  650 mg Oral Q6H PRN Juanito Doom, MD      . ALPRAZolam Duanne Moron) tablet 2 mg  2 mg Oral TID PRN Juanito Doom, MD   2 mg at 07/19/20 1053  . atropine injection 1 mg  1 mg Intravenous Once PRN Simonne Maffucci B, MD      . bisacodyl (DULCOLAX) suppository 10 mg  10 mg Rectal Daily PRN Noemi Chapel P, DO      . busPIRone (BUSPAR) tablet 10 mg  10 mg Oral BID Suella Broad, FNP  10 mg at 07/19/20 1035  . carvedilol (COREG) tablet 6.25 mg  6.25 mg Oral BID WC Kc, Ramesh, MD   6.25 mg at 07/19/20 1043  . chlorhexidine (PERIDEX) 0.12 % solution 15 mL  15 mL Mouth Rinse BID Frederik Pear, MD   15 mL at 07/19/20 1036  . chlorhexidine gluconate (MEDLINE KIT) (PERIDEX) 0.12 % solution 15 mL  15 mL Mouth Rinse BID Simonne Maffucci B, MD   15 mL at 07/18/20 0914  . Chlorhexidine Gluconate Cloth 2 % PADS 6 each  6 each Topical Daily Sampson Goon, MD   6 each at 07/19/20 1037  . docusate (COLACE) 50 MG/5ML liquid 100 mg  100 mg Oral BID PRN Simonne Maffucci B, MD      . escitalopram (LEXAPRO) tablet 10 mg  10 mg Oral QHS Suella Broad, FNP   10 mg at 07/18/20 2248  . Eslicarbazepine Acetate TABS 1,600 mg  1,600 mg Oral Daily Simonne Maffucci B, MD   1,600 mg at 07/19/20 1039  . famotidine (PEPCID) tablet 20 mg  20 mg Oral BID Simonne Maffucci B, MD   20 mg at 07/19/20 1035  . feeding supplement (ENSURE ENLIVE / ENSURE PLUS) liquid 237 mL  237 mL Oral TID BM Kc, Ramesh, MD   237 mL at 07/19/20 1051  . haloperidol lactate (HALDOL) injection 5 mg  5 mg Intravenous Q6H PRN Juanito Doom, MD   5 mg at 07/17/20 0645  . hydrALAZINE (APRESOLINE) tablet 25 mg  25 mg Oral Q6H PRN Kc, Ramesh, MD   25 mg at 07/18/20 1713  . labetalol (NORMODYNE)  injection 10 mg  10 mg Intravenous Q2H PRN Opyd, Ilene Qua, MD   10 mg at 07/18/20 1545  . lactulose (CHRONULAC) 10 GM/15ML solution 30 g  30 g Oral Daily Noemi Chapel P, DO   30 g at 07/19/20 1035  . losartan (COZAAR) tablet 100 mg  100 mg Oral Daily Kc, Ramesh, MD   100 mg at 07/19/20 1035  . MEDLINE mouth rinse  15 mL Mouth Rinse q12n4p Frederik Pear, MD   15 mL at 07/19/20 1229  . ondansetron (ZOFRAN) injection 4 mg  4 mg Intravenous Q6H PRN Anders Simmonds, MD   4 mg at 07/12/20 1056  . paliperidone (INVEGA SUSTENNA) injection 234 mg  234 mg Intramuscular Once Suella Broad, FNP      . paliperidone (INVEGA) 24 hr tablet 6 mg  6 mg Oral Daily Akintayo, Mojeed, MD   6 mg at 07/19/20 1043  . polyethylene glycol (MIRALAX / GLYCOLAX) packet 17 g  17 g Oral Daily PRN Sampson Goon, MD   17 g at 07/11/20 1001  . sennosides (SENOKOT) 8.8 MG/5ML syrup 5 mL  5 mL Oral BID PRN Noemi Chapel P, DO      . valproate (DEPACON) 500 mg in dextrose 5 % 50 mL IVPB  500 mg Intravenous Q6H Lora Havens, MD 55 mL/hr at 07/19/20 1228 500 mg at 07/19/20 1228    Musculoskeletal: Strength & Muscle Tone: increased Gait & Station: unsteady Patient leans: N/A  Psychiatric Specialty Exam: Physical Exam Vitals and nursing note reviewed.  HENT:     Head: Normocephalic.     Nose: Nose normal.  Pulmonary:     Effort: Pulmonary effort is normal.  Musculoskeletal:     Cervical back: Normal range of motion.  Neurological:     General: No focal deficit present.  Mental Status: He is alert.  Psychiatric:        Attention and Perception: Attention normal. He is attentive.        Mood and Affect: Mood is anxious.        Speech: Speech normal.        Behavior: Behavior normal. Behavior is not aggressive.        Thought Content: Thought content normal.        Cognition and Memory: Cognition normal.        Judgment: Judgment normal. Judgment is not impulsive.     Review of Systems   Psychiatric/Behavioral: Positive for behavioral problems. The patient is nervous/anxious.   All other systems reviewed and are negative.   Blood pressure (!) 155/98, pulse 92, temperature 98.3 F (36.8 C), temperature source Oral, resp. rate 18, height 6' (1.829 m), weight 75.8 kg, SpO2 100 %.Body mass index is 22.66 kg/m.  General Appearance: Casual  Eye Contact:  Good  Speech:  Clear and Coherent  Volume:  Normal  Mood:  Euthymic  Affect:  Appropriate, Congruent and elated  Thought Process:  Coherent, Linear and Descriptions of Associations: Tangential  Orientation:  Full (Time, Place, and Person)  Thought Content:  Logical and Obsessions  Suicidal Thoughts:  No  Homicidal Thoughts:  No  Memory:  Immediate;   Fair Recent;   Fair Remote;   Fair  Judgement:  Fair  Insight:  Present  Psychomotor Activity:  Normal  Concentration:  Concentration: Fair and Attention Span: Fair  Recall:  [employer] of Knowledge:  Fair  Language:  Fair  Akathisia:  No  Handed:  Right  AIMS (if indicated):     Assets:  Housing Leisure Time Resilience Social Support  ADL's:  Intact  Cognition:  WNL  Sleep:       24 year old male with poorly controlled status epilepticus, ADHD, substance use disorder, bipolar.  Patient appears to be back at his baseline, and has stablized on oral medication.  He has demonstrated or tolerability of 2 psychotropic medications to include olanzapine and paliperidone.  As stated there was a mutual agreement amongst both parties his legal guardian, and patient to start long-acting injectable to improve his overall mental health.  He does show some improvement in insight and judgment, and agrees to stay 1 additional night for close observation of long-acting injectable.  Throughout the evaluation he was able to recall risk and benefits, long-term complications of illicit substance use, and importance of medication compliance for seizures and his psychosis.  We did discuss in  detail the likelihood of developing schizophrenia, and the importance of having a psychological evaluation completed.  This Probation officer did address all comments questions and concerns, support was offered, reassurance offered to both parties.  He denies suicidal ideations at this time.  Treatment Plan Summary: -Will receive Invega Sustenna 234 mg IM and a single dose.  Did review side effect profile. -Follow-up has been placed in AVS for behavioral health urgent care (will need second dose of Invega 156 mg on day 8) this has been communicated with both patient and mother.  -Will recommend social work schedule appointment to ensure continuity and transition of care. -Continue  Lexapro 10 mg daily -Continue Buspar 10 mg BID -Continue Xanax 2 mg TID PRN -Consider social worker consult to facilitate inpatient psychiatric admission -Discussed findings with referring provider who requested to place discharge orders for psychiatric medications.  Medications have been ordered and sent to pharmacy on file to include  Lexapro 10, BuSpar 10 p.o. twice daily.  Will not prescribe Xanax at discharge.  He is currently taking Depakote 2000 mg, last valproic acid level was 95 on October 20.  Will repeat valproic acid level prior to discharge, however this medication is being managed by neurology for his epilepsy.  We will also obtain baseline lipid panel considering patient will likely need long-term psychotropic use for management of his current symptoms.    Disposition: No evidence of imminent risk to self or others at present.   Patient does not meet criteria for psychiatric inpatient admission. Supportive therapy provided about ongoing stressors. Discussed crisis plan, support from social network, calling 911, coming to the Emergency Department, and calling Suicide Hotline. Patient to receive long-acting injectable during hospital admission, and continued observation on the monitor.  Will be able to discharge home  tomorrow.  At this time will psychiatrically cleared patient.  Suella Broad, FNP 07/19/2020 12:37 PM

## 2020-07-19 NOTE — Progress Notes (Signed)
PROGRESS NOTE    Dylan Moon  IRW:[id number] DOB: June 06, 1996 DOA: 07/01/2020 PCP: [facility]   Chief Complaint  Patient presents with  . Seizures   Brief Narrative: 24 year old male with history of seizure disorder stable status ADHD who had seizure on the morning of 07/01/2020 for 30 minutes are interrupted given benzodiazepine by EMS but did not resolve and in the ED was apneic needing intubation was loaded with AEDs, was admitted to ICU, by neurology, PCCM, extubated 10/13.  Hospital course was complicated with needing intubation 10/19 and subsequently extubated 10/21.  Work-up with MRI brain no acute finding, LP no significant WBC or RBC, CT abdomen pelvis clinic ileus RLL infiltrate. Is followed by psychiatry currently in IVC and awaiting for transfer to inpatient psychiatry, he was transferred under [facility] service 10/25 while he is waiting for placement  Subjective: Alert awake, oriented appropriate interacting well. Blood pressure running high.  Overnight episode of epistaxis Lovenox discontinued.  Assessment & Plan:  Status epilepticus: No further seizures while on medication.  He was seen by neurology underwent extensive work-up as above with LP, MRI brain.  Patient reports she was not compliant with this AEDs, and moving forward he reports he will be.  Continue  Eslicarbazepine, valproate for seizures- resuming home dose orally today discussed with Dr. Hortense Ramal advised to continue on 200 mg daily.  Hypertension poorly controlled, continue losartan 1 mg daily, added Coreg 6.25 mg twice daily today.  Eliquis follow-up with PCP to monitor and adjust medication.    Epistaxis unclear etiology x1 resolved.  DC Lovenox.  Optimize blood pressure control  Adjustment disorder/Behavioral disturbance/ADHD: concern for new schizophrenia. Followed by psychiatry under IVC.  Seen by psychiatry on reeval 10/27-planning for IM Invega 234 mg with repeat dose on day 8 156 mg and to  monitor overnight and no longer need for inpatient psych admission and okay for discharge home tomorrow.  Continue Lexapro 10 mg daily BuSpar 10 mg twice daily, and Xanax  Hyperammonemia improved,can be see on Depakote without symptomsewly. RLL pneumonia resolved, is off antibiotics Colonic Ileus appears resolved.  Tolerating diet no nausea vomiting. Acute respiratory failure inability to protect airway in the setting of needing procedure 10/19 resolved.  On room air. Bradycardia in the setting of Precedex use. Nutrition: Diet Order            Diet - low sodium heart healthy           Diet regular Room service appropriate? Yes; Fluid consistency: Thin  Diet effective now                 Nutrition Problem: Inadequate oral intake Etiology: inability to eat Signs/Symptoms: NPO status Interventions: Ensure Enlive (each supplement provides 350kcal and 20 grams of protein), Refer to RD note for recommendations  Body mass index is 22.66 kg/m.  DVT prophylaxis: SCDs Start: 07/01/20 1235 Code Status:   Code Status: Full Code  Family Communication: plan of care discussed with patient and his mother at bedside.  Status is: Inpatient Remains inpatient appropriate because:Inpatient level of care appropriate due to severity of illness and Ongoing management of his uncontrolled hypertension, unsafe disposition as he needs inpatient psychiatric facility  Dispo: The patient is from: Home              Anticipated d/c is PY:PPJK              Anticipated d/c date is: Home tomorrow after monitoring overnight after IM Invega.  Patient currently is not medically stable to d/c. Consultants:see note  Procedures:see note   CT Head 10/9>>Negative for intracranial abnormalities   cEEG 10/9 - 10/10>>Negative for seizures or epileptiform discharges. Severe diffuse encephalopathy.  10/18 valproate level elevated and ammonia elevated  10/20 MRI brain > negative for acute  intracranial abnormality, but positive for a small left-side subependymal gray matter heterotopia, stable since 2018.  No evidence of mesial temporal sclerosis, fluid in pharynx, mastoid, paranasal sinuses  10/20 LP > no significant WBC or RBC  10/20 CT abdomen/pelvis > colonic ileus, RLL infiltrate   Culture/Microbiology      Component Value Date/Time   SDES CSF 07/11/2020 1608   SPECREQUEST NONE 07/11/2020 1608   CULT  07/11/2020 1608    NO GROWTH 3 DAYS Performed at [facility], [place] [address] [place], [place] [address]    REPTSTATUS 07/15/2020 FINAL 07/11/2020 1608    Other culture-see note  Medications: Scheduled Meds: . busPIRone  10 mg Oral BID  . carvedilol  6.25 mg Oral BID WC  . chlorhexidine  15 mL Mouth Rinse BID  . chlorhexidine gluconate (MEDLINE KIT)  15 mL Mouth Rinse BID  . Chlorhexidine Gluconate Cloth  6 each Topical Daily  . escitalopram  10 mg Oral QHS  . Eslicarbazepine Acetate  1,600 mg Oral Daily  . famotidine  20 mg Oral BID  . feeding supplement  237 mL Oral TID BM  . lactulose  30 g Oral Daily  . losartan  100 mg Oral Daily  . mouth rinse  15 mL Mouth Rinse q12n4p  . paliperidone  234 mg Intramuscular Once  . paliperidone  6 mg Oral Daily   Continuous Infusions: . sodium chloride Stopped (07/11/20 1707)  . valproate sodium 500 mg (07/19/20 1228)    Antimicrobials: Cefepime 10/14 >> 10/14 Zosyn 10/14 >>10/20  Objective: Vitals: Today's Vitals   07/19/20 0522 07/19/20 0635 07/19/20 0800 07/19/20 0801  BP: (!) 160/98 (!) 169/100  (!) 155/98  Pulse: 86   92  Resp: 20   18  Temp:    98.3 F (36.8 C)  TempSrc:    Oral  SpO2:    100%  Weight:      Height:      PainSc:   0-No pain     Intake/Output Summary (Last 24 hours) at 07/19/2020 1338 Last data filed at 07/19/2020 0900 Gross per 24 hour  Intake 560 ml  Output 1050 ml  Net -490 ml   Filed Weights   07/13/20 0500 07/17/20 0500 07/18/20 0345  Weight: 87 kg 83.2  kg 75.8 kg   Weight change:   Intake/Output from previous day: 10/26 0701 - 10/27 0700 In: 580 [P.O.:580] Out: 1300 [Urine:1300] Intake/Output this shift: Total I/O In: 220 [P.O.:220] Out: -   Examination: General exam: AAOx3 , NAD, weak appearing. HEENT:Oral mucosa moist, Ear/Nose WNL grossly, dentition normal. Respiratory system: bilaterally clear,no wheezing or crackles,no use of accessory muscle Cardiovascular system: S1 & S2 +, No JVD,. Gastrointestinal system: Abdomen soft, NT,ND, BS+ Nervous System:Alert, awake, moving extremities and grossly nonfocal Extremities: No edema, distal peripheral pulses palpable.  Skin: No rashes,no icterus. MSK: Normal muscle bulk,tone, power  Data Reviewed: I have personally reviewed following labs and imaging studies CBC: Recent Labs  Lab 07/13/20 0147 07/14/20 0355 07/15/20 0639  WBC 6.7 9.9 5.2  NEUTROABS 3.8 6.9 2.1  HGB 13.9 12.9* 13.7  HCT 41.6 39.2 41.0  MCV 86.8 86.7 89.3  PLT 394 430* 409*  Basic Metabolic Panel: Recent Labs  Lab 07/13/20 0147 07/14/20 0355 07/15/20 0639  NA 137 141 138  K 3.9 3.7 3.9  CL 105 104 104  CO2 22 21* 24  GLUCOSE 125* 95 109*  BUN _0 CREATININE 0.99 1.11 1.02  CALCIUM 9.0 9.6 9.5  MG 1.8  --   --   PHOS 4.4  --   --    GFR: Estimated Creatinine Clearance: 118.7 mL/min (by C-G formula based on SCr of 1.02 mg/dL). Liver Function Tests: Recent Labs  Lab 07/13/20 0147 07/14/20 0355 07/15/20 0639 07/17/20 0431  AST 26 55* 37  --   ALT _1 --   ALKPHOS 42 43 38  --   BILITOT 0.3 0.5 0.5  --   PROT 6.1* 7.2 6.6  --   ALBUMIN 2.9* 3.7 3.1* 3.5   No results for input(s): LIPASE, AMYLASE in the last 168 hours. Recent Labs  Lab 07/14/20 0355  AMMONIA 37*   Coagulation Profile: No results for input(s): INR, PROTIME in the last 168 hours. Cardiac Enzymes: No results for input(s): CKTOTAL, CKMB, CKMBINDEX, TROPONINI in the last 168 hours. BNP (last 3 results) No  results for input(s): PROBNP in the last 8760 hours. HbA1C: No results for input(s): HGBA1C in the last 72 hours. CBG: Recent Labs  Lab 07/14/20 2326 07/15/20 0334 07/16/20 0736 07/16/20 1141 07/16/20 1552  GLUCAP 105* 106* 102* 127* 101*   Lipid Profile: No results for input(s): CHOL, HDL, LDLCALC, TRIG, CHOLHDL, LDLDIRECT in the last 72 hours. Thyroid Function Tests: No results for input(s): TSH, T4TOTAL, FREET4, T3FREE, THYROIDAB in the last 72 hours. Anemia Panel: No results for input(s): VITAMINB12, FOLATE, FERRITIN, TIBC, IRON, RETICCTPCT in the last 72 hours. Sepsis Labs: No results for input(s): PROCALCITON, LATICACIDVEN in the last 168 hours.  Recent Results (from the past 240 hour(s))  CSF culture     Status: None   Collection Time: 07/11/20  4:08 PM   Specimen: CSF; Cerebrospinal Fluid  Result Value Ref Range Status   Specimen Description CSF  Final   Special Requests NONE  Final   Gram Stain   Final    WBC PRESENT, PREDOMINANTLY MONONUCLEAR NO ORGANISMS SEEN    Culture   Final    NO GROWTH 3 DAYS Performed at [facility], [address] [place], [place] [address]    Report Status 07/15/2020 FINAL  Final     Radiology Studies: No results found.   LOS: 18 days   Antonieta Pert, MD [facility]  07/19/2020, 1:38 PM

## 2020-07-19 NOTE — Progress Notes (Signed)
Physical Therapy Treatment Patient Details Name: Dylan Moon MRN: [id number] DOB: 1996-08-08 Today's Date: 07/19/2020    History of Present Illness 24 y.o male presenting with seizure that had 30 min of uninterrupted seizure that was not resolved with benzodiazepines . He was given further benzodiazepines on arrival in the department of emergency medicine however he subsequently became apneic and required intubation. Having extreme agitation along course. PMH includes seizure history, ADHD. Psych following.    PT Comments    Pt demonstrating improved gait, pt tolerating dynamic gait challenges better than previously. Pt continues to lack insight into deficits, stating "I feel like Maylene Roes, I could sprint a football field". PT educated pt on slow reintroduction into activity, and activity progression once home. PT to continue to follow.     Follow Up Recommendations  Other (comment) (pending psych workup)     Equipment Recommendations  None recommended by PT    Recommendations for Other Services       Precautions / Restrictions Precautions Precautions: Fall Restrictions Weight Bearing Restrictions: No    Mobility  Bed Mobility Overal bed mobility: Needs Assistance Bed Mobility: Supine to Sit     Supine to sit: Modified independent (Device/Increase time)     General bed mobility comments: Mod I for increased time  Transfers Overall transfer level: Needs assistance Equipment used: None Transfers: Sit to/from Stand Sit to Stand: Independent         General transfer comment: no physical assist  Ambulation/Gait Ambulation/Gait assistance: Modified independent (Device/Increase time) Gait Distance (Feet): 450 Feet Assistive device: None Gait Pattern/deviations: Step-through pattern Gait velocity: decr   General Gait Details: Mod I for slowed gait, no overt LOB noted when PT challenging gait.   Stairs             Wheelchair Mobility    Modified  Rankin (Stroke Patients Only)       Balance Overall balance assessment: Needs assistance Sitting-balance support: No upper extremity supported;Feet supported Sitting balance-Leahy Scale: Good     Standing balance support: During functional activity Standing balance-Leahy Scale: Good                              Cognition Arousal/Alertness: Awake/alert Behavior During Therapy: WFL for tasks assessed/performed Overall Cognitive Status: No family/caregiver present to determine baseline cognitive functioning                                 General Comments: WFL for tasks assessed, lacks insight into deficits stating "I feel like Maylene Roes, I could run down a football field"      Exercises Other Exercises Other Exercises: squats x15 Other Exercises: wall push ups x15    General Comments General comments (skin integrity, edema, etc.): HRmax 137 bpm post-squats      Pertinent Vitals/Pain Pain Assessment: Faces Faces Pain Scale: No hurt Pain Intervention(s): Limited activity within patient's tolerance;Monitored during session    Home Living                      Prior Function            PT Goals (current goals can now be found in the care plan section) Acute Rehab PT Goals Patient Stated Goal: return home soon PT Goal Formulation: With patient Time For Goal Achievement: 07/28/20 Potential to Achieve Goals: Good Progress towards PT  goals: Progressing toward goals    Frequency    Min 3X/week      PT Plan Current plan remains appropriate    Co-evaluation              AM-PAC PT "6 Clicks" Mobility   Outcome Measure  Help needed turning from your back to your side while in a flat bed without using bedrails?: None Help needed moving from lying on your back to sitting on the side of a flat bed without using bedrails?: None Help needed moving to and from a bed to a chair (including a wheelchair)?: None Help needed standing  up from a chair using your arms (e.g., wheelchair or bedside chair)?: None Help needed to walk in hospital room?: A Little Help needed climbing 3-5 steps with a railing? : A Little 6 Click Score: 22    End of Session   Activity Tolerance: Patient tolerated treatment well Patient left: with call bell/phone within reach;in chair;with nursing/sitter in room (sitter in room) Nurse Communication: Mobility status PT Visit Diagnosis: Other abnormalities of gait and mobility (R26.89);Difficulty in walking, not elsewhere classified (R26.2)     Time: 4097-3532 PT Time Calculation (min) (ACUTE ONLY): 16 min  Charges:  $Gait Training: 8-22 mins                    Izea Livolsi E, PT Acute Rehabilitation Services Pager [phone]  Office [phone]    Milta Croson D Despina Hidden 07/19/2020, 5:05 PM

## 2020-07-19 NOTE — Plan of Care (Signed)
  Problem: Activity: Goal: Risk for activity intolerance will decrease Outcome: Progressing   Problem: Pain Managment: Goal: General experience of comfort will improve Outcome: Progressing   Problem: Skin Integrity: Goal: Risk for impaired skin integrity will decrease Outcome: Progressing   

## 2020-07-20 LAB — LIPID PANEL
Cholesterol: 218 mg/dL — ABNORMAL HIGH (ref 0–200)
HDL: 46 mg/dL (ref >40)
LDL Cholesterol: 140 mg/dL — ABNORMAL HIGH (ref 0–99)
Total CHOL/HDL Ratio: 4.7 RATIO
Triglycerides: 160 mg/dL — ABNORMAL HIGH (ref <150)
VLDL: 32 mg/dL (ref 0–40)

## 2020-07-20 LAB — VALPROIC ACID LEVEL: Valproic Acid Lvl: 63 ug/mL (ref 50.0–100.0)

## 2020-07-20 MED ORDER — DIVALPROEX SODIUM ER 500 MG PO TB24: 2000.0000 mg | ORAL_TABLET | Freq: Every day | ORAL | 0 refills | Status: DC

## 2020-07-20 MED ORDER — LACTULOSE 10 GM/15ML PO SOLN: 30.0000 g | Freq: Every day | ORAL | 0 refills | Status: AC

## 2020-07-20 MED ORDER — CARVEDILOL 6.25 MG PO TABS: 6.2500 mg | ORAL_TABLET | Freq: Two times a day (BID) | ORAL | 1 refills | Status: DC

## 2020-07-20 MED ORDER — LOSARTAN POTASSIUM 100 MG PO TABS: 100.0000 mg | ORAL_TABLET | Freq: Every day | ORAL | 1 refills | Status: DC

## 2020-07-20 MED ORDER — APTIOM 800 MG PO TABS: 1600.0000 mg | ORAL_TABLET | Freq: Every day | ORAL | 0 refills | Status: DC

## 2020-07-20 NOTE — Progress Notes (Signed)
Occupational Therapy Treatment Patient Details Name: Dylan Moon MRN: [id number] DOB: 1996-02-18 Today's Date: 07/20/2020    History of present illness 24 y.o male presenting with seizure that had 30 min of uninterrupted seizure that was not resolved with benzodiazepines . He was given further benzodiazepines on arrival in the department of emergency medicine however he subsequently became apneic and required intubation. Having extreme agitation along course. PMH includes seizure history, ADHD. Psych following.   OT comments  Patient continues to make steady progress towards goals in skilled OT session. Patient's session encompassed trail mapping, lower body dressing, and functional mobility to complete tasks independently. Pt is very motivated to discharge home, however continues to require cues in order to pace self as he moves impulsively through all tasks. Pt able to complete trail mapping task, spell WORLD backwards, and have a conversation while walking demonstrating no deviation in attention. Pt continues to swap minimally into therapist when ambulating, but not LOB noted. Discharge home and follow up with outpatient OT remains appropriate to assess higher level cognitive tasks; will continue to follow acutely.    Follow Up Recommendations  Outpatient OT;Other (comment) (neuro)    Equipment Recommendations  None recommended by OT    Recommendations for Other Services      Precautions / Restrictions Precautions Precautions: Fall Restrictions Weight Bearing Restrictions: No       Mobility Bed Mobility Overal bed mobility: Needs Assistance Bed Mobility: Supine to Sit     Supine to sit: Modified independent (Device/Increase time)     General bed mobility comments: for safety, implusive in movment  Transfers Overall transfer level: Modified independent Equipment used: None Transfers: Sit to/from Stand Sit to Stand: Independent         General transfer comment: no  physical assist    Balance Overall balance assessment: Needs assistance Sitting-balance support: No upper extremity supported;Feet supported Sitting balance-Leahy Scale: Good     Standing balance support: During functional activity Standing balance-Leahy Scale: Good Standing balance comment: overall fair balance, intermittent min guard for safety during transitions, currently cannot walk in straight line without bumping into therapist                           ADL either performed or assessed with clinical judgement   ADL Overall ADL's : Needs assistance/impaired                     Lower Body Dressing: Supervision/safety;Sitting/lateral leans Lower Body Dressing Details (indicate cue type and reason): donned and tied shes             Functional mobility during ADLs: Min guard;Supervision/safety General ADL Comments: balance conitnues to improve, but remains unaware in his need to pace himself and acknowledge to take tasks a little slower     Vision       Perception     Praxis      Cognition Arousal/Alertness: Awake/alert Behavior During Therapy: WFL for tasks assessed/performed Overall Cognitive Status: Impaired/Different from baseline Area of Impairment: Problem solving;Safety/judgement                         Safety/Judgement: Decreased awareness of deficits;Decreased awareness of safety   Problem Solving: Slow processing;Requires tactile cues;Difficulty sequencing;Requires verbal cues General Comments: Continues to lack insight into deficits        Exercises     Shoulder Instructions  General Comments      Pertinent Vitals/ Pain       Pain Assessment: Faces Faces Pain Scale: No hurt  Home Living                                          Prior Functioning/Environment              Frequency  Min 2X/week        Progress Toward Goals  OT Goals(current goals can now be found in the care  plan section)  Progress towards OT goals: Progressing toward goals  Acute Rehab OT Goals Patient Stated Goal: go home OT Goal Formulation: With patient Time For Goal Achievement: 07/28/20 Potential to Achieve Goals: Good  Plan Discharge plan remains appropriate;Frequency remains appropriate    Co-evaluation                 AM-PAC OT "6 Clicks" Daily Activity     Outcome Measure   Help from another person eating meals?: None Help from another person taking care of personal grooming?: None Help from another person toileting, which includes using toliet, bedpan, or urinal?: A Little Help from another person bathing (including washing, rinsing, drying)?: A Little Help from another person to put on and taking off regular upper body clothing?: None Help from another person to put on and taking off regular lower body clothing?: None 6 Click Score: 22    End of Session    OT Visit Diagnosis: Other abnormalities of gait and mobility (R26.89);Other symptoms and signs involving the nervous system (R29.898)   Activity Tolerance Patient tolerated treatment well   Patient Left in bed;with call bell/phone within reach;with nursing/sitter in room;with family/visitor present   Nurse Communication Mobility status        Time: 6269-4854 OT Time Calculation (min): 13 min  Charges: OT General Charges $OT Visit: 1 Visit OT Treatments $Self Care/Home Management : 8-22 mins  Pollyann Glen E. Karlina Suares, COTA/L Acute Rehabilitation Services [phone]   Cherlyn Cushing 07/20/2020, 12:21 PM

## 2020-07-20 NOTE — Progress Notes (Signed)
Discharge instructions were reviewed with pt and mother; voiced understanding. [name] home meds were picked up from pharmacy and sent home with pt. Personal belongings were packed by pt and transported with pt. Pt. ambulated with nursing staff to private vehicle driven by pt's mom.

## 2020-07-20 NOTE — Discharge Summary (Signed)
Physician Discharge Summary  Dylan Moon AOZ:[id number] DOB: 1996-03-29 DOA: 07/01/2020  PCP: [facility]  Admit date: 07/01/2020 Discharge date: 07/20/2020  Admitted From: home Disposition:  Home  Recommendations for Outpatient Follow-up:  1. Follow up with PCP in 1-2 weeks 2. Please obtain BMP/CBC in one week 3. Please follow up on the following pending results:  Home Health:no  Equipment/Devices: none  Discharge Condition: Stable Code Status:   Code Status: Full Code Diet recommendation:  Diet Order            Diet - low sodium heart healthy           Diet regular Room service appropriate? Yes; Fluid consistency: Thin  Diet effective now                 Brief/Interim Summary: 24 year old male with history of seizure disorder stable status ADHD who had seizure on the morning of 07/01/2020 for 30 minutes are interrupted given benzodiazepine by EMS but did not resolve and in the ED was apneic needing intubation was loaded with AEDs, was admitted to ICU, by neurology, PCCM, extubated 10/13.  Hospital course was complicated with needing intubation 10/19 and subsequently extubated 10/21.  Work-up with MRI brain no acute finding, LP no significant WBC or RBC, CT abdomen pelvis clinic ileus RLL infiltrate. Is followed by psychiatry currently in IVC and awaiting for transfer to inpatient psychiatry, he was transferred under [facility] service 10/25 while he is waiting for placement  Discharge Diagnoses:   Status epilepticus: No further seizures while on medication.  He was seen by neurology underwent extensive work-up as above with LP,MRI brain. Patient reports she was not compliant with this AEDs, and moving forward he reports he will be. Continue  Eslicarbazepine, valpraote -Discussed with Dr. Melynda Ripple advised to continue on 2000 mg daily VA and 1600 ml home dose of Aptiom . valpraote level stable  Hypertension poorly controlled, continue losartan 100 mg daily, added Coreg  6.25 mg twice daily-he will follow up with his PCP and work on blood pressure adjustment.  Blood pressure today was improving in 130s.    Epistaxis unclear etiology x1 resolved.  Off Lovenox.  No recurrence. No recurrence.  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 Followed by psychiatry under IVC.  Seen by psychiatry on reeval 10/27-planning for IM Invega 234 mg with repeat dose on day 8 156 mg and to monitor overnight and no longer need for inpatient psych admission and okay for discharge home tomorrow.  Continue Lexapro 10 mg daily BuSpar 10 mg twice daily, and Xanax  Hyperammonemia improved,can be see on Depakote without symptomsewly. RLL pneumonia resolved, is off antibiotics Colonic Ileus appears resolved.  Tolerating diet no nausea vomiting. Acute respiratory failure inability to protect airway in the setting of needing procedure 10/19 resolved.  On room air. Bradycardia in the setting of Precedex use.  Heart rate is stable tolerating Coreg. Discussed with Aram Beecham from Child psychotherapist and she has completed paperwork for IVC rescind and okay to d/c Called and discussed with patient's mother verbalized understanding, agreeable for discharge to home today  Consults:  pccm,neurology  Subjective: Alert awake resting comfortably not in distress no suicidal or homicidal thoughts. Excited to go home today after being cleared by psychiatry.  Discharge Exam: Vitals:   07/20/20 0347 07/20/20 0830  BP: 131/83 (!) 156/97  Pulse: 98   Resp: 17   Temp: 98.4 F (36.9 C) 98.5 F (36.9 C)  SpO2: 98%  General: Pt is alert, awake, not in acute distress Cardiovascular: RRR, S1/S2 +, no rubs, no gallops Respiratory: CTA bilaterally, no wheezing, no rhonchi Abdominal: Soft, NT, ND, bowel sounds + Extremities: no edema, no cyanosis  Discharge Instructions  Discharge Instructions    Diet - low sodium heart healthy   Complete by: As directed    Discharge  instructions   Complete by: As directed    Please call call MD or return to ER for similar or worsening recurring problem that brought you to hospital or if any fever,nausea/vomiting,abdominal pain, uncontrolled pain, chest pain,  shortness of breath or any other alarming symptoms.  Take your seizure medication as prescribed, follow-up with your neurologist and psychiatrist.  Please follow-up your doctor as instructed in a week time and call the office for appointment.  Please avoid alcohol, smoking, or any other illicit substance and maintain healthy habits including taking your regular medications as prescribed.  You were cared for by a hospitalist during your hospital stay. If you have any questions about your discharge medications or the care you received while you were in the hospital after you are discharged, you can call the unit and ask to speak with the hospitalist on call if the hospitalist that took care of you is not available.  Once you are discharged, your primary care physician will handle any further medical issues. Please note that NO REFILLS for any discharge medications will be authorized once you are discharged, as it is imperative that you return to your primary care physician (or establish a relationship with a primary care physician if you do not have one) for your aftercare needs so that they can reassess your need for medications and monitor your lab values   Increase activity slowly   Complete by: As directed      Allergies as of 07/20/2020      Reactions   Keppra [levetiracetam] Other (See Comments)   Irritability   Tramadol Other (See Comments)   Contraindicated with current medications (??)   Vimpat [lacosamide] Other (See Comments)   Causes anger      Medication List    TAKE these medications   ALPRAZolam 0.25 MG tablet Commonly known as: XANAX Take 0.25 mg by mouth daily as needed for anxiety.   Aptiom 800 MG Tabs Generic drug: Eslicarbazepine  Acetate Take 1,600 mg by mouth daily.   busPIRone 10 MG tablet Commonly known as: BUSPAR Take 1 tablet (10 mg total) by mouth 2 (two) times daily. What changed:   medication strength  how much to take   carvedilol 6.25 MG tablet Commonly known as: COREG Take 1 tablet (6.25 mg total) by mouth 2 (two) times daily with a meal.   divalproex 500 MG 24 hr tablet Commonly known as: DEPAKOTE ER Take 4 tablets (2,000 mg total) by mouth daily. What changed: how much to take   escitalopram 10 MG tablet Commonly known as: LEXAPRO Take 1 tablet (10 mg total) by mouth at bedtime. What changed:   medication strength  how much to take   lactulose 10 GM/15ML solution Commonly known as: CHRONULAC Take 45 mLs (30 g total) by mouth daily for 5 days.   losartan 100 MG tablet Commonly known as: COZAAR Take 1 tablet (100 mg total) by mouth daily.   Vitamin D (Ergocalciferol) 1.25 MG (50000 UNIT) Caps capsule Commonly known as: DRISDOL Take 50,000 Units by mouth every Monday.       Follow-up Information    [facility].   Why: Medication Management and Psychological evaluation Contact information: [address] [place] [address] [phone]        [facility]. Schedule an appointment as soon as possible for a visit in 1 day(s).   Specialty: Urgent Care Why: Will need second loading dose of Invega on november 4 or november 5th. Please schedule this appointment ahead of time, in order to make sure you receive your injection in time.  Contact information: [address] [place] [address] [phone]             Allergies  Allergen Reactions  . Keppra [Levetiracetam] Other (See Comments)    Irritability   . Tramadol Other (See Comments)    Contraindicated with current medications (??)  . Vimpat [Lacosamide] Other (See Comments)    Causes anger    The results of significant diagnostics  from this hospitalization (including imaging, microbiology, ancillary and laboratory) are listed below for reference.    Microbiology: Recent Results (from the past 240 hour(s))  CSF culture     Status: None   Collection Time: 07/11/20  4:08 PM   Specimen: CSF; Cerebrospinal Fluid  Result Value Ref Range Status   Specimen Description CSF  Final   Special Requests NONE  Final   Gram Stain   Final    WBC PRESENT, PREDOMINANTLY MONONUCLEAR NO ORGANISMS SEEN    Culture   Final    NO GROWTH 3 DAYS Performed at [facility], [address] [place], [place] [address]    Report Status 07/15/2020 FINAL  Final    Procedures/Studies: EEG  Result Date: 07/12/2020 Charlsie Quest, MD     07/12/2020  5:28 PM Patient Name: MAXX PHAM MRN: [id number] Epilepsy Attending: Charlsie Quest Referring Physician/Provider: Dr Lindie Spruce Date: 07/12/2020 Duration: 23.23 mins  Patient history: 24yo M with h/o epilepsy who presented with breakthrough seizure. EEG to evaluate for seizure  Level of alertness:  Comatose  AEDs during EEG study: VPA,  propofol  Technical aspects: This EEG study was done with scalp electrodes positioned according to the 10-20 International system of electrode placement. Electrical activity was acquired at a sampling rate of 500Hz  and reviewed with a high frequency filter of 70Hz  and a low frequency filter of 1Hz . EEG data were recorded continuously and digitally stored.  Description:  EEG showed continuous generalized 3-6Hz  theta-delta slowing. Hyperventilation and photic stimulation were not performed.    ABNORMALITY -Continuous slow, generalized  IMPRESSION: This study is suggestive of severe diffuse encephalopathy, nonspecific etiology but likely related to sedation. No seizures or epileptiform discharges were seen throughout the recording.     CT Head Wo Contrast  Result Date: 07/01/2020 CLINICAL DATA:  Seizure. EXAM: CT HEAD WITHOUT  CONTRAST TECHNIQUE: Contiguous axial images were obtained from the base of the skull through the vertex without intravenous contrast. COMPARISON:  December 24, 2019 FINDINGS: Brain: No evidence of acute infarction, hemorrhage, hydrocephalus, extra-axial collection or mass lesion/mass effect. Vascular: No hyperdense vessel or unexpected calcification. Skull: Normal. Negative for fracture or focal lesion. Sinuses/Orbits: No acute finding. Other: None. IMPRESSION: No focal acute intracranial abnormality identified. Electronically Signed   By: Charlsie Quest M.D.   On: 07/01/2020 13:37   MR BRAIN W WO CONTRAST  Result Date: 07/12/2020 CLINICAL DATA:  24 year old male with altered mental status, seizure, suspicion of CNS infection. EXAM: MRI HEAD WITHOUT AND WITH CONTRAST TECHNIQUE: Multiplanar,  multiecho pulse sequences of the brain and surrounding structures were obtained without and with intravenous contrast. CONTRAST:  9mL GADAVIST GADOBUTROL 1 MMOL/ML IV SOLN COMPARISON:  Head CT 07/01/2020.  Brain MRI 01/03/2018 and earlier. 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 This is stable since 2018. No other heterotopia or migrational abnormality identified. Thin slice coronal images demonstrate a relatively stable and symmetric appearance of the hippocampal formations. No features of mesial temporal sclerosis. No restricted diffusion to suggest acute infarction. No midline shift, mass effect, evidence of mass lesion, ventriculomegaly, extra-axial collection or acute intracranial hemorrhage. Cervicomedullary junction and pituitary are within normal limits. No cerebral edema identified. No encephalomalacia. No chronic cerebral blood products. No abnormal enhancement identified.  No dural thickening. Vascular: Major intracranial vascular flow  voids are stable since 2019. the major dural venous sinuses are enhancing and appear to be patent. Skull and upper cervical spine: Negative. Negative visible cervical spine and spinal cord. Sinuses/Orbits: Negative orbits. The patient is intubated. Fluid or mucosal thickening in both sphenoid sinuses and posterior right ethmoid air cell is increased since 2019. Other: Fluid in the pharynx associated with intubation. Mild associated bilateral mastoid effusions. IMPRESSION: 1. Negative for acute intracranial abnormality, but positive for a small left-side subependymal gray matter heterotopia, stable since 2018. No other migrational abnormality identified. No evidence of mesial temporal sclerosis. 2. Intubated, with associated fluid in the pharynx, mastoid and paranasal sinus effusions. Electronically Signed   By: Odessa Fleming M.D.   On: 07/12/2020 03:47   CT ABDOMEN PELVIS W CONTRAST  Result Date: 07/12/2020 CLINICAL DATA:  Colonic distension on recent plain film, possible obstruction EXAM: CT ABDOMEN AND PELVIS WITH CONTRAST TECHNIQUE: Multidetector CT imaging of the abdomen and pelvis was performed using the standard protocol following bolus administration of intravenous contrast. CONTRAST:  OMNIPAQUE IOHEXOL 300 MG/ML  SOLN COMPARISON:  Plain film from earlier in the same day FINDINGS: Lower chest: Lung bases demonstrate consolidation in the right lower lobe with multiple air bronchograms consistent with pneumonia. Left basilar atelectasis is noted. There is a somewhat nodular density identified on image number 33 of series 5. This is likely related to the adjacent atelectasis. Hepatobiliary: Gallbladder is within normal limits. Liver is homogeneous in attenuation without focal mass. If small area of increased enhancement is noted in the lateral segment of the left lobe of the liver consistent with small hemangioma. Pancreas: Unremarkable. No pancreatic ductal dilatation or surrounding inflammatory changes.  Spleen: Normal in size without focal abnormality. Adrenals/Urinary Tract: Adrenal glands are within normal limits. Kidneys show normal enhancement pattern. No renal calculi or obstructive changes are noted. The bladder is well distended. Stomach/Bowel: Colon again demonstrates gaseous distension similar to that seen on prior plain film examination. Scattered fecal material and fluid is noted throughout the colon. No obstructing lesion is noted. Areas of spasm are seen in the colon. The appendix is within normal limits. Small bowel shows no dilatation. Stomach is within normal limits. Gastric catheter is noted extending into the stomach. Vascular/Lymphatic: No significant vascular findings are present. No enlarged abdominal or pelvic lymph nodes. Reproductive: Prostate is unremarkable. Other: No abdominal wall hernia or abnormality. No abdominopelvic ascites. Musculoskeletal: No acute or significant osseous findings. IMPRESSION: Gaseous distension of the colon with fecal material and fluid within similar to that seen on recent plain film  examination. No obstructing lesion is seen. This likely represents a diffuse colonic ileus. Correlation with physical exam is recommended. Changes in the lung bases right greater than left consistent with multifocal pneumonia. A somewhat nodular area is noted in the left base although likely related to the underlying inflammatory change. Small hemangioma in the left lobe of the liver. Electronically Signed   By: Alcide Clever M.D.   On: 07/12/2020 21:10   DG CHEST PORT 1 VIEW  Result Date: 07/11/2020 CLINICAL DATA:  Endotracheal tube placement. EXAM: PORTABLE CHEST 1 VIEW COMPARISON:  July 08, 2020 FINDINGS: Since the prior study there is been interval placement of an endotracheal tube. Its distal tip is seen approximately 3.3 cm from the carina. Interval nasogastric tube placement is also noted with its distal end overlying the expected region of the body of the stomach. Mild  right basilar infiltrate is seen which is mildly decreased in severity when compared to the prior study. There is no evidence of a pleural effusion or pneumothorax. The heart size and mediastinal contours are within normal limits. The visualized skeletal structures are unremarkable. IMPRESSION: 1. Endotracheal and nasogastric tube placement and positioning, as described above. 2. Mild right basilar infiltrate, decreased in severity when compared to the prior study. Electronically Signed   By: Aram Candela M.D.   On: 07/11/2020 18:48   DG Chest Port 1 View  Result Date: 07/08/2020 CLINICAL DATA:  Pneumonia. EXAM: PORTABLE CHEST 1 VIEW COMPARISON:  July 06, 2020 FINDINGS: Infiltrate remains in the right base, similar in the interval. The heart, hila, and mediastinum are unchanged. No pneumothorax. No other suspicious findings. IMPRESSION: Persistent right lower lobe infiltrate, likely pneumonia given history. Recommend short-term follow-up imaging to ensure resolution. Electronically Signed   By: Gerome Sam III M.D   On: 07/08/2020 10:08   DG CHEST PORT 1 VIEW  Result Date: 07/06/2020 CLINICAL DATA:  Pneumonia EXAM: PORTABLE CHEST 1 VIEW COMPARISON:  July 01, 2020 FINDINGS: Endotracheal tube and nasogastric tube have been removed. Enteric feeding catheter tip is incompletely visualized but appears near the pylorus. Shallow degree of inspiration. There is airspace opacity in the right lower lung region. Lungs elsewhere are clear. Heart is upper normal in size with pulmonary vascularity normal. No adenopathy. No bone lesions. IMPRESSION: Right lower lobe airspace opacity consistent with pneumonia. Lungs elsewhere clear. Stable cardiac silhouette. Enteric tube tip near pylorus, incompletely visualized. Electronically Signed   By: Bretta Bang III M.D.   On: 07/06/2020 09:03   DG Chest Port 1 View  Result Date: 07/01/2020 CLINICAL DATA:  Seizure. EXAM: PORTABLE CHEST 1 VIEW COMPARISON:   Chest radiograph 12/27/2019. FINDINGS: ETT tip projects at the carina, recommend retraction. Enteric tube tip and side-port project over the stomach. Monitoring leads overlie the patient. Stable cardiac and mediastinal contours. Low lung volumes. Bibasilar heterogeneous opacities. No pleural effusion or pneumothorax. Thoracic spine degenerative changes. IMPRESSION: ET tube terminates at the carina, recommend retraction. Low lung volumes with bibasilar opacities favored represent atelectasis. These results will be called to the ordering clinician or representative by the Radiologist Assistant, and communication documented in the PACS or [employer]. Electronically Signed   By: Annia Belt M.D.   On: 07/01/2020 12:02   DG Abd Portable 1V  Result Date: 07/14/2020 CLINICAL DATA:  Colonic distention EXAM: PORTABLE ABDOMEN - 1 VIEW COMPARISON:  07/12/2020 FINDINGS: Dilated gas-filled small and large bowel likely representing adynamic ileus. The enteric tube is been removed in the interval. No radiopaque stones.  Visualized bones appear intact. IMPRESSION: Dilated gas-filled small and large bowel likely representing adynamic ileus. Electronically Signed   By: Burman Nieves M.D.   On: 07/14/2020 05:41   DG Abd Portable 1V  Result Date: 07/12/2020 CLINICAL DATA:  Profound vomiting.  No bowel movement in days. EXAM: PORTABLE ABDOMEN - 1 VIEW COMPARISON:  Chest x-ray 07/11/2020.  Abdomen 12/26/2019. FINDINGS: NG tube noted with tip over the stomach. Colonic distention up to 8 cm noted colonic obstruction or colonic ileus could present this fashion. Follow-up exam suggested to demonstrate resolution. Stool in the colon. No free air. Thoracolumbar spine degenerative changes scoliosis. IMPRESSION: 1. NG tube noted with tip over the stomach. 2. Colonic distention up to 8 cm noted colonic obstruction or colonic ileus could present this fashion. Follow-up exam suggested to demonstrate resolution. Electronically  Signed   By: Maisie Fus  Register   On: 07/12/2020 14:19   DG Abd Portable 1 View  Result Date: 07/01/2020 CLINICAL DATA:  Verify OG tube placement. EXAM: PORTABLE ABDOMEN - 1 VIEW COMPARISON:  Chest radiograph-earlier same day FINDINGS: When correlated with preceding chest radiograph, enteric tube tip and side port project over the expected location of the gastric fundus. Paucity of bowel gas without evidence of enteric obstruction Nondiagnostic evaluation for pneumoperitoneum secondary to supine positioning and exclusion of the lower thorax. No pneumatosis or portal venous gas. No acute osseous abnormalities IMPRESSION: Enteric tube tip and side port project over the expected location the gastric fundus Electronically Signed   By: Simonne Come M.D.   On: 07/01/2020 12:27   EEG adult  Result Date: 07/01/2020 Charlsie Quest, MD     07/02/2020  8:07 AM Patient Name: NAMARI BRETON MRN: [id number] Epilepsy Attending: Charlsie Quest Referring Physician/Provider: Dr Nilda Simmer Date: 07/01/2020 Duration: 27.14 mins Patient history: 24yo M with h/o epilepsy who presented with breakthrough seizure. EEG to evaluate for seizure Level of alertness:  Comatose AEDs during EEG study: VPA, versed, propofol Technical aspects: This EEG study was done with scalp electrodes positioned according to the 10-20 International system of electrode placement. Electrical activity was acquired at a sampling rate of 500Hz  and reviewed with a high frequency filter of 70Hz  and a low frequency filter of 1Hz . EEG data were recorded continuously and digitally stored. Description:  EEG showed continuous generalized 2-3Hz  delta slowing with overriding 15 to 18 Hz beta activity distributed symmetrically and diffusely. Hyperventilation and photic stimulation were not performed.   ABNORMALITY -Continuous slow, generalized IMPRESSION: This study is suggestive of severe diffuse encephalopathy, nonspecific etiology but likely related to sedation.  No seizures or epileptiform discharges were seen throughout the recording. Priyanka   Overnight EEG with video  Result Date: 07/02/2020 , MD     07/03/2020  5:05 PM Patient Name: ALDO SONDGEROTH MRN: Charlsie Quest Epilepsy Attending: 09/02/2020 Referring Physician/Provider: Dr Gabriel Carina Duration: 07/02/2020 1408 to 07/03/2020 1408  Patient history: 24yo M with h/o epilepsy who presented with breakthrough seizure. EEG to evaluate for seizure  Level of alertness:  Comatose  AEDs during EEG study: VPA, Aptiom, versed, propofol  Technical aspects: This EEG study was done with scalp electrodes positioned according to the 10-20 International system of electrode placement. Electrical activity was acquired at a sampling rate of 500Hz  and reviewed with a high frequency filter of 70Hz  and a low frequency filter of 1Hz . EEG data were recorded continuously and digitally stored.  Description:  EEG showed continuous generalized 2-3Hz  delta slowing  with overriding 15 to 18 Hz beta activity distributed symmetrically and diffusely. Hyperventilation and photic stimulation were not performed.   Event button was pressed on 07/02/2020 at 2054, 2116 and 2128. On video, patient was noted to have brief twitching. Concomitant eeg before, during and after the event didn't show any eeg change.    ABNORMALITY -Continuous slow, generalized  IMPRESSION: This study is suggestive of severe diffuse encephalopathy, nonspecific etiology but likely related to sedation. No seizures or epileptiform discharges were seen throughout the recording. Event button was pressed as described above without concomitant eeg change and was not epileptic.   Priyanka O Yadav    Labs: BNP (last 3 results) Recent Labs    12/24/19 1046  BNP 76.0   Basic Metabolic Panel: Recent Labs  Lab 07/14/20 0355 07/15/20 0639  NA 141 138  K 3.7 3.9  CL 104 104  CO2 21* 24  GLUCOSE 95 109*  BUN 7 10  CREATININE 1.11 1.02   CALCIUM 9.6 9.5   Liver Function Tests: Recent Labs  Lab 07/14/20 0355 07/15/20 0639 07/17/20 0431  AST 55* 37  --   ALT 28 26  --   ALKPHOS 43 38  --   BILITOT 0.5 0.5  --   PROT 7.2 6.6  --   ALBUMIN 3.7 3.1* 3.5   No results for input(s): LIPASE, AMYLASE in the last 168 hours. Recent Labs  Lab 07/14/20 0355  AMMONIA 37*   CBC: Recent Labs  Lab 07/14/20 0355 07/15/20 0639  WBC 9.9 5.2  NEUTROABS 6.9 2.1  HGB 12.9* 13.7  HCT 39.2 41.0  MCV 86.7 89.3  PLT 430* 409*   Cardiac Enzymes: No results for input(s): CKTOTAL, CKMB, CKMBINDEX, TROPONINI in the last 168 hours. BNP: Invalid input(s): POCBNP CBG: Recent Labs  Lab 07/14/20 2326 07/15/20 0334 07/16/20 0736 07/16/20 1141 07/16/20 1552  GLUCAP 105* 106* 102* 127* 101*   D-Dimer No results for input(s): DDIMER in the last 72 hours. Hgb A1c No results for input(s): HGBA1C in the last 72 hours. Lipid Profile Recent Labs    07/20/20 0340  CHOL 218*  HDL 46  LDLCALC 140*  TRIG 160*  CHOLHDL 4.7   Thyroid function studies No results for input(s): TSH, T4TOTAL, T3FREE, THYROIDAB in the last 72 hours.  Invalid input(s): FREET3 Anemia work up No results for input(s): VITAMINB12, FOLATE, FERRITIN, TIBC, IRON, RETICCTPCT in the last 72 hours. Urinalysis    Component Value Date/Time   COLORURINE YELLOW 07/02/2020 1630   APPEARANCEUR CLOUDY (A) 07/02/2020 1630   LABSPEC 1.033 (H) 07/02/2020 1630   PHURINE 5.0 07/02/2020 1630   GLUCOSEU NEGATIVE 07/02/2020 1630   HGBUR NEGATIVE 07/02/2020 1630   BILIRUBINUR NEGATIVE 07/02/2020 1630   KETONESUR NEGATIVE 07/02/2020 1630   PROTEINUR NEGATIVE 07/02/2020 1630   NITRITE NEGATIVE 07/02/2020 1630   LEUKOCYTESUR NEGATIVE 07/02/2020 1630   Sepsis Labs Invalid input(s): PROCALCITONIN,  WBC,  LACTICIDVEN Microbiology Recent Results (from the past 240 hour(s))  CSF culture     Status: None   Collection Time: 07/11/20  4:08 PM   Specimen: CSF;  Cerebrospinal Fluid  Result Value Ref Range Status   Specimen Description CSF  Final   Special Requests NONE  Final   Gram Stain   Final    WBC PRESENT, PREDOMINANTLY MONONUCLEAR NO ORGANISMS SEEN    Culture   Final    NO GROWTH 3 DAYS Performed at [facility], [address] [place], [place] [address]  Report Status 07/15/2020 FINAL  Final     Time coordinating discharge: 25  minutes  SIGNED: Lanae Boastamesh Stokely Jeancharles, MD  [facility] 07/20/2020, 11:36 AM  If 7PM-7AM, please contact night-coverage [email or web address]

## 2020-07-20 NOTE — TOC Progression Note (Signed)
Transition of Care [facility]) - Progression Note    Patient Details  Name: Dylan Moon MRN: [id number] Date of Birth: Jan 08, 1996  Transition of Care [facility]) CM/SW Contact  Eduard Roux, [place] Phone Number: 07/20/2020, 12:28 PM  Clinical Narrative:     IVC expires tomorrow- patient has been cleared by psych & IVC has been rescinded.   CSW faxed Notice of Commitment Change to [facility] of [address].   Singed form placed in patient's chart.  Antony Blackbird, MSW, LCSWA Clinical Social Worker   Expected Discharge Plan: Psychiatric [facility] Barriers to Discharge: Psych Bed not available  Expected Discharge Plan and Services Expected Discharge Plan: Psychiatric Hospital         Expected Discharge Date: 07/20/20                                     Social Determinants of Health (SDOH) Interventions    Readmission Risk Interventions No flowsheet data found.

## 2020-07-20 NOTE — TOC Progression Note (Signed)
Transition of Care [facility]) - Progression Note    Patient Details  Name: ZAYDENN BALAGUER MRN: [id number] Date of Birth: 04-11-1996  Transition of Care [facility]) CM/SW Contact  Eduard Roux, [place] Phone Number: 07/20/2020, 2:09 PM  Clinical Narrative:     CSW received call from [facility]- patient has 3 no shows/no call, July/September and October-they are unable to accommodate patient .  Antony Blackbird, MSW, LCSWA Clinical Social Worker   Expected Discharge Plan: Psychiatric [facility] Barriers to Discharge: Psych Bed not available  Expected Discharge Plan and Services Expected Discharge Plan: Psychiatric Hospital         Expected Discharge Date: 07/20/20                                     Social Determinants of Health (SDOH) Interventions    Readmission Risk Interventions No flowsheet data found.

## 2020-07-26 ENCOUNTER — Telehealth (HOSPITAL_COMMUNITY): Payer: Self-pay | Admitting: *Deleted

## 2020-07-26 NOTE — Telephone Encounter (Signed)
Call from patients mom but later did speak with patient to arrange an appt for him for his injection. He is recently out of [facility] and received Gean [place] while there and mom says he needs his second shot in two weeks which is up on Monday. He then will start getting an injection q 1 month. He has never been seen by this clinic. Explained to him he needs to be seen by our provider before I can give him a shot. Gave him walk in times with the plan for him to come in Monday at 8 for dr assessment and injection. He does have medicaid but will provide the first shot as a sample then call in future injections to his pharmacy for him to bring to his future appts. Verbalized understanding.

## 2020-08-01 ENCOUNTER — Other Ambulatory Visit: Payer: Self-pay

## 2020-08-03 ENCOUNTER — Telehealth (HOSPITAL_COMMUNITY): Payer: Self-pay | Admitting: *Deleted

## 2020-08-03 NOTE — Telephone Encounter (Signed)
Discussed with Dr Evelene Croon rescheduling this patient who is recently discharged from [facility] hospital and ordered Invega IM. Initially he was hospitalized for seizure and intubated but had a psych consult and referred to behavioral health for Invega IM and assessment. He was not able to be seen on TUes when he showed up with his mom to be seen. Called today and plan for them to return on Monday the 15 th as a walk in for initial assessment with a provider and possibly an injection.

## 2020-08-09 ENCOUNTER — Ambulatory Visit (INDEPENDENT_AMBULATORY_CARE_PROVIDER_SITE_OTHER): Payer: Medicaid Other | Admitting: Psychiatry

## 2020-08-09 ENCOUNTER — Ambulatory Visit (INDEPENDENT_AMBULATORY_CARE_PROVIDER_SITE_OTHER): Payer: Medicaid Other | Admitting: *Deleted

## 2020-08-09 ENCOUNTER — Other Ambulatory Visit: Payer: Self-pay

## 2020-08-09 ENCOUNTER — Encounter (HOSPITAL_COMMUNITY): Payer: Self-pay | Admitting: Psychiatry

## 2020-08-09 VITALS — BP 133/71 | HR 87 | Ht 71.0 in | Wt 204.0 lb

## 2020-08-09 DIAGNOSIS — F250 Schizoaffective disorder, bipolar type: Secondary | ICD-10-CM

## 2020-08-09 DIAGNOSIS — F79 Unspecified intellectual disabilities: Secondary | ICD-10-CM | POA: Diagnosis not present

## 2020-08-09 MED ORDER — PALIPERIDONE PALMITATE ER 156 MG/ML IM SUSY
156.0000 mg | PREFILLED_SYRINGE | INTRAMUSCULAR | Status: AC
  Administered 2020-08-09 – 2021-06-14 (×11): 156 mg via INTRAMUSCULAR

## 2020-08-09 MED ORDER — INVEGA SUSTENNA 156 MG/ML IM SUSY: 156.0000 mg | PREFILLED_SYRINGE | INTRAMUSCULAR | 6 refills | Status: DC

## 2020-08-09 NOTE — Progress Notes (Signed)
In as a walk in to see Dr Evelene Croon, today is a first time appt since being discharged from the hospital. He is accompanied by his mom. Last time I spoke with mom via phone patient in court, states court is not resolved yet but he doesn't believe it will result in him having to do any jail time. He did not offer what court was regarding. He is pleasant and appropriate. He is a bit nervous re getting his shot and required some encouragement verbally and also gave him chocolate after getting his injection. Received his injection in his R deltoid today. Hugged staff once shot was over. To return in one month for next injection.
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
## 2020-08-31 ENCOUNTER — Ambulatory Visit (HOSPITAL_COMMUNITY): Payer: Medicaid Other | Admitting: Physician Assistant

## 2020-09-01 ENCOUNTER — Ambulatory Visit (HOSPITAL_COMMUNITY): Payer: Medicaid Other | Admitting: Physician Assistant

## 2020-09-08 ENCOUNTER — Ambulatory Visit (HOSPITAL_COMMUNITY): Payer: Medicaid Other

## 2020-09-08 ENCOUNTER — Encounter (HOSPITAL_COMMUNITY): Payer: Medicaid Other | Admitting: Physician Assistant

## 2020-09-14 ENCOUNTER — Other Ambulatory Visit: Payer: Self-pay

## 2020-09-14 ENCOUNTER — Encounter (HOSPITAL_COMMUNITY): Payer: Self-pay

## 2020-09-14 ENCOUNTER — Ambulatory Visit (INDEPENDENT_AMBULATORY_CARE_PROVIDER_SITE_OTHER): Payer: Medicaid Other | Admitting: Physician Assistant

## 2020-09-14 ENCOUNTER — Ambulatory Visit (HOSPITAL_COMMUNITY): Payer: Medicaid Other | Admitting: *Deleted

## 2020-09-14 VITALS — BP 144/78 | HR 93 | Ht 69.0 in | Wt 218.0 lb

## 2020-09-14 DIAGNOSIS — F79 Unspecified intellectual disabilities: Secondary | ICD-10-CM | POA: Diagnosis not present

## 2020-09-14 DIAGNOSIS — F250 Schizoaffective disorder, bipolar type: Secondary | ICD-10-CM

## 2020-09-14 NOTE — Progress Notes (Signed)
In accompanied by his mom for his med check with provider and his monthly injection. He was scheduled initially for the 20 th but he called to reschedule. Today he received his injection of Abilify Sustenna 156 mg in his L deltoid. He tolerated it well. Yesterday he had a surgery to regulate his seizures. Psychiatrically he has no complaints, but tired and sore from yesterdays surgery. To return in one month for his next injection and in three months for his next appt with provider.

## 2020-10-13 ENCOUNTER — Ambulatory Visit (HOSPITAL_COMMUNITY): Payer: Medicaid Other

## 2020-10-17 ENCOUNTER — Other Ambulatory Visit: Payer: Self-pay

## 2020-10-17 ENCOUNTER — Ambulatory Visit (HOSPITAL_COMMUNITY): Payer: Medicaid Other | Admitting: *Deleted

## 2020-10-17 ENCOUNTER — Encounter (HOSPITAL_COMMUNITY): Payer: Self-pay

## 2020-10-17 VITALS — BP 155/100 | HR 64

## 2020-10-17 DIAGNOSIS — F250 Schizoaffective disorder, bipolar type: Secondary | ICD-10-CM

## 2020-10-17 NOTE — Progress Notes (Signed)
Accompanied by his mom to his injection. He is angry today and states he is not taking this shot anymore. He is argumentative with his mom and one time telling her to shut up. Writer provided education as to why he takes the shot but he said he didn't care and he doesn't need it. Told him the alternative would be being in the hospital frequently and again he responded he didn't care but said go ahead with this shot and he would not be coming back for any further injections. Today took [place] S 156 mg in R deltoid. He was given a return appt in one month, will see if he shows up for it.

## 2020-10-23 ENCOUNTER — Encounter (HOSPITAL_COMMUNITY): Payer: Self-pay | Admitting: Physician Assistant

## 2020-10-23 NOTE — Progress Notes (Signed)
BH MD/PA/NP OP Progress Note  09/14/2020 10:30 AM [name]  MRN:  [id number]  Chief Complaint: Dylan Moon shot appointment  HPI:  Dylan Moon is a 25 year old male with a past psychiatric history significant for schizoaffective disorder (bipolar type) who presents to [facility] for his Invega injection.  Patient reports that he has been doing okay and that he has been doing well on his Invega injections.  Per mother, patient has had ADHD but was never treated as a child.  Patient's mother also reports that he has been having seizures prior to the encounter and underwent surgery to have a stimulator placed into him to prevent seizures.  Patient's mother states that he has PTSD from the surgery and the patient has not been able to sit still since having the surgery.  Patient's mother states that the patient has been getting into trouble and she would like a formal psychiatric evaluation performed on the patient.  Patient is not on any other psychiatric medications.  Patient reports that he is in a good mood and that he is not experiencing any episodes of depression or irritability.  He denies suicidal or homicidal ideations.  He further denies auditory or visual hallucinations.  Patient endorses good sleep and receives on average 7 to 8 hours of restful sleep each night.  Patient endorses good appetite and eats on average 3-4 meals per day.  Patient denies alcohol use.  Patient does not use any tobacco products but has recently just started using vaping products.  Patient reports that he has cut back on his smoking and marijuana but states that he smokes 2-3 times per day.  Patient has no other concerns today.   Visit Diagnosis:    ICD-10-CM   1. Schizoaffective disorder, bipolar type (HCC)  F25.0     Past Psychiatric History:  Bipolar disorder versus Schizophrenia, unspecified intellectual disabilities  Past Medical History:  Past Medical  History:  Diagnosis Date  . ADHD   . Scoliosis   . Seizures (HCC)    most recent 12/02/17    Past Surgical History:  Procedure Laterality Date  . NO PAST SURGERIES      Family Psychiatric History:  Unknown  Family History:  Family History  Problem Relation Age of Onset  . Diabetes Mother   . Hypertension Mother   . Cancer Other   . Diabetes Father   . Seizures Maternal Grandfather     Social History:  Social History   Socioeconomic History  . Marital status: Single    Spouse name: Not on file  . Number of children: 0  . Years of education: HS  . Highest education level: Not on file  Occupational History  . Occupation: Arboriculturist  Tobacco Use  . Smoking status: Never Smoker  . Smokeless tobacco: Never Used  Vaping Use  . Vaping Use: Never used  Substance and Sexual Activity  . Alcohol use: No  . Drug use: Yes    Types: Marijuana    Comment: Daily use of marijuana.  . Sexual activity: Not Currently  Other Topics Concern  . Not on file  Social History Narrative   Lives at home his mother.   3-4 sodas per week.   Right-handed.   Social Determinants of Health   Financial Resource Strain: Not on file  Food Insecurity: Not on file  Transportation Needs: Not on file  Physical Activity: Not on file  Stress: Not on file  Social  Connections: Not on file    Allergies:  Allergies  Allergen Reactions  . Keppra [Levetiracetam] Other (See Comments)    Irritability   . Tramadol Other (See Comments)    Contraindicated with current medications (??)  . Vimpat [Lacosamide] Other (See Comments)    Causes anger    Metabolic Disorder Labs: Lab Results  Component Value Date   HGBA1C 5.8 (H) 12/24/2019   MPG 119.76 12/24/2019   No results found for: PROLACTIN Lab Results  Component Value Date   CHOL 218 (H) 07/20/2020   TRIG 160 (H) 07/20/2020   HDL 46 07/20/2020   CHOLHDL 4.7 07/20/2020   VLDL 32 07/20/2020   LDLCALC 140 (H)  07/20/2020   Lab Results  Component Value Date   TSH 2.226 07/10/2020   TSH 2.542 12/24/2019    Therapeutic Level Labs: No results found for: LITHIUM Lab Results  Component Value Date   VALPROATE 63 07/20/2020   VALPROATE 95 07/12/2020   No components found for:  CBMZ  Current Medications: Current Outpatient Medications  Medication Sig Dispense Refill  . ALPRAZolam (XANAX) 0.25 MG tablet Take 0.25 mg by mouth daily as needed for anxiety.    . APTIOM 800 MG TABS Take 1,600 mg by mouth daily. 30 tablet 0  . busPIRone (BUSPAR) 10 MG tablet Take 1 tablet (10 mg total) by mouth 2 (two) times daily.    . carvedilol (COREG) 6.25 MG tablet Take 1 tablet (6.25 mg total) by mouth 2 (two) times daily with a meal. 60 tablet 1  . divalproex (DEPAKOTE ER) 500 MG 24 hr tablet Take 4 tablets (2,000 mg total) by mouth daily. 120 tablet 0  . escitalopram (LEXAPRO) 10 MG tablet Take 1 tablet (10 mg total) by mouth at bedtime. 30 tablet 0  . losartan (COZAAR) 100 MG tablet Take 1 tablet (100 mg total) by mouth daily. 30 tablet 1  . paliperidone (INVEGA SUSTENNA) 156 MG/ML SUSY injection Inject 1 mL (156 mg total) into the muscle every 30 (thirty) days. 1 mL 6  . Vitamin D, Ergocalciferol, (DRISDOL) 1.25 MG (50000 UNIT) CAPS capsule Take 50,000 Units by mouth every Monday.      Current Facility-Administered Medications  Medication Dose Route Frequency Provider Last Rate Last Admin  . paliperidone (INVEGA SUSTENNA) injection 156 mg  156 mg Intramuscular Q30 days Zena Amos, MD   156 mg at 10/17/20 1558     Musculoskeletal: Strength & Muscle Tone: within normal limits Gait & Station: normal Patient leans: N/A   Psychiatric Specialty Exam: Review of Systems  Psychiatric/Behavioral: Positive for agitation. Negative for dysphoric mood, hallucinations, self-injury, sleep disturbance and suicidal ideas. The patient is not nervous/anxious and is not hyperactive.     There were no vitals taken for  this visit.There is no height or weight on file to calculate BMI.  General Appearance: Fairly Groomed and Neat  Eye Contact:  Good  Speech:  Clear and Coherent and Normal Rate  Volume:  Normal  Mood:  Euthymic  Affect:  Appropriate  Thought Process:  Coherent, Goal Directed and Descriptions of Associations: Intact  Orientation:  Full (Time, Place, and Person)  Thought Content: Logical   Suicidal Thoughts:  No  Homicidal Thoughts:  No  Memory:  Immediate;   Good Recent;   Good Remote;   Fair  Judgement:  Fair  Insight:  Fair  Psychomotor Activity:  Normal  Concentration:  Concentration: Good and Attention Span: Good  Recall:  [employer] of Knowledge:  Good  Language: Good  Akathisia:  Negative  Handed:  Right  AIMS (if indicated): not done  Assets:  Communication Skills Desire for Improvement Financial Resources/Insurance Housing Social Support Transportation  ADL's:  Intact  Cognition: Impaired,  Mild  Sleep:  Good   Screenings: GAD-7   Flowsheet Row Office Visit from [facility]/23/2019 in [facility]  Total GAD-7 Score 1    PHQ2-9   Flowsheet Row ED from 03/14/2020 in [facility] Office Visit from 04/23/2019 in [facility]  PHQ-2 Total Score 2 0  PHQ-9 Total Score 8 0       Assessment and Plan:  Harlyn E. Alonso is a 25 year old male with a past psychiatric history significant for schizoaffective disorder (bipolar type) who presents to [facility] for his Invega injection.  Patient reports that he has been doing okay and that he has been doing well on his Invega injections.  Patient's mother is concerned over his behavior.  She reports that he has been getting into trouble lately and has been gaining a lot of weight.  Upon the next encounter, patient's mother would like for the patient to have a formal psychiatric evaluation.  1.  Schizoaffective disorder, bipolar type (HCC) Patient received his paliperidone (INVEGA SUSTENNA) injection 156 mg  Patient to report back in 4 weeks to receive his next [place] injection.  2. Unspecified intellectual disabilities  Patient to follow-up in 4 weeks to receive next injection  Meta Hatchet, PA 10/23/2020, 7:37 PM

## 2020-11-13 ENCOUNTER — Ambulatory Visit (HOSPITAL_COMMUNITY): Payer: Medicaid Other

## 2020-11-14 ENCOUNTER — Ambulatory Visit (HOSPITAL_COMMUNITY): Payer: Medicaid Other

## 2020-11-16 ENCOUNTER — Other Ambulatory Visit: Payer: Self-pay

## 2020-11-16 ENCOUNTER — Ambulatory Visit (INDEPENDENT_AMBULATORY_CARE_PROVIDER_SITE_OTHER): Payer: Medicaid Other | Admitting: *Deleted

## 2020-11-16 ENCOUNTER — Encounter (HOSPITAL_COMMUNITY): Payer: Self-pay

## 2020-11-16 VITALS — BP 142/70 | HR 77 | Ht 69.0 in | Wt 220.0 lb

## 2020-11-16 DIAGNOSIS — F250 Schizoaffective disorder, bipolar type: Secondary | ICD-10-CM

## 2020-11-16 NOTE — Progress Notes (Signed)
Patient arrived today with Mom for Injection 156 mg Dylan Moon Patient agreeable  to injection today & is pleasant. tolerated injection well in Left-Arm. NO HI/SI/NOR AH/VH.

## 2020-11-22 ENCOUNTER — Ambulatory Visit: Payer: Self-pay | Admitting: Family

## 2020-12-13 ENCOUNTER — Ambulatory Visit (HOSPITAL_COMMUNITY): Payer: Medicaid Other | Admitting: *Deleted

## 2020-12-13 ENCOUNTER — Other Ambulatory Visit: Payer: Self-pay

## 2020-12-13 ENCOUNTER — Encounter (HOSPITAL_COMMUNITY): Payer: Self-pay | Admitting: Physician Assistant

## 2020-12-13 ENCOUNTER — Ambulatory Visit (INDEPENDENT_AMBULATORY_CARE_PROVIDER_SITE_OTHER): Payer: Medicaid Other | Admitting: Physician Assistant

## 2020-12-13 ENCOUNTER — Encounter (HOSPITAL_COMMUNITY): Payer: Self-pay

## 2020-12-13 VITALS — BP 150/83 | HR 70 | Ht 69.0 in | Wt 224.0 lb

## 2020-12-13 DIAGNOSIS — F250 Schizoaffective disorder, bipolar type: Secondary | ICD-10-CM | POA: Diagnosis not present

## 2020-12-13 DIAGNOSIS — F79 Unspecified intellectual disabilities: Secondary | ICD-10-CM

## 2020-12-13 NOTE — Progress Notes (Signed)
In as scheduled for his injection and also a med check with his provider, Otila Back PA. He is continuing with Invega S 156 mg and today he got his injection in his R deltoid. Discussed with him in the future trying an every three month version of his shot if he continues to be stable and compliant with his appts. He brings his own injection because he has MCD.He is pleasant, appropriate and demonstrates humor today. He states he is staying busy with his music. To return in one month with Clinical research associate and three months with provider.

## 2020-12-13 NOTE — Progress Notes (Signed)
BH MD/PA/NP OP Progress Note  12/13/2020 3:03 PM Dylan Moon  MRN:  [id number]  Chief Complaint: Follow up and scheduled injection  HPI:   Dylan Moon is a 25 year old male with a past psychiatric history significant for schizoaffective disorder (bipolar type) who presents to [facility] for follow-up and his Invega injection.  Patient reports that he has been doing well and has no issues or concerns at this time.  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  Patient reports that he is currently working with the likes of 1 Dylan Moon, Dylan Moon, Dylan Moon, and Dylan Moon.  A GAD-7 screen was performed today with the patient scoring a 4.  Patient is pleasant, calm, cooperative and fully engaged in conversation during the encounter.  Patient reports that his mood is happy.  Patient denies suicidal or homicidal ideations.  He further denies auditory or visual hallucinations.  Patient endorses good sleep and receives on average 8 to 10 hours of sleep per night.  Patient endorses good appetite and eats on average 3 meals per day.  Patient denies alcohol consumption and illicit drug use.  Patient does report tobacco use and states that he smokes 1 Black and Mild per day.  Visit Diagnosis:    ICD-10-CM   1. Schizoaffective disorder, bipolar type (HCC)  F25.0   2. Unspecified intellectual disabilities  F79     Past Psychiatric History:  Bipolar disorder versus Schizophrenia, unspecified intellectual disabilities  Past Medical History:  Past Medical History:  Diagnosis Date  . ADHD   . Scoliosis   . Seizures (HCC)    most recent 12/02/17    Past Surgical History:  Procedure Laterality Date  . NO PAST SURGERIES      Family Psychiatric History:  Unknown  Family History:  Family History  Problem Relation Age of Onset  . Diabetes Mother   . Hypertension Mother   . Cancer Other   .  Diabetes Father   . Seizures Maternal Grandfather     Social History:  Social History   Socioeconomic History  . Marital status: Single    Spouse name: Not on file  . Number of children: 0  . Years of education: HS  . Highest education level: Not on file  Occupational History  . Occupation: Arboriculturist  Tobacco Use  . Smoking status: Never Smoker  . Smokeless tobacco: Never Used  Vaping Use  . Vaping Use: Never used  Substance and Sexual Activity  . Alcohol use: No  . Drug use: Yes    Types: Marijuana    Comment: Daily use of marijuana.  . Sexual activity: Not Currently  Other Topics Concern  . Not on file  Social History Narrative   Lives at home his mother.   3-4 sodas per week.   Right-handed.   Social Determinants of Health   Financial Resource Strain: Not on file  Food Insecurity: Not on file  Transportation Needs: Not on file  Physical Activity: Not on file  Stress: Not on file  Social Connections: Not on file    Allergies:  Allergies  Allergen Reactions  . Keppra [Levetiracetam] Other (See Comments)    Irritability   . Tramadol Other (See Comments)    Contraindicated with current medications (??)  . Vimpat [Lacosamide] Other (See Comments)    Causes anger    Metabolic Disorder Labs: Lab Results  Component Value Date  HGBA1C 5.8 (H) 12/24/2019   MPG 119.76 12/24/2019   No results found for: PROLACTIN Lab Results  Component Value Date   CHOL 218 (H) 07/20/2020   TRIG 160 (H) 07/20/2020   HDL 46 07/20/2020   CHOLHDL 4.7 07/20/2020   VLDL 32 07/20/2020   LDLCALC 140 (H) 07/20/2020   Lab Results  Component Value Date   TSH 2.226 07/10/2020   TSH 2.542 12/24/2019    Therapeutic Level Labs: No results found for: LITHIUM Lab Results  Component Value Date   VALPROATE 63 07/20/2020   VALPROATE 95 07/12/2020   No components found for:  CBMZ  Current Medications: Current Outpatient Medications  Medication Sig  Dispense Refill  . ALPRAZolam (XANAX) 0.25 MG tablet Take 0.25 mg by mouth daily as needed for anxiety.    . APTIOM 800 MG TABS Take 1,600 mg by mouth daily. 30 tablet 0  . busPIRone (BUSPAR) 10 MG tablet Take 1 tablet (10 mg total) by mouth 2 (two) times daily.    . carvedilol (COREG) 6.25 MG tablet Take 1 tablet (6.25 mg total) by mouth 2 (two) times daily with a meal. 60 tablet 1  . divalproex (DEPAKOTE ER) 500 MG 24 hr tablet Take 4 tablets (2,000 mg total) by mouth daily. 120 tablet 0  . escitalopram (LEXAPRO) 10 MG tablet Take 1 tablet (10 mg total) by mouth at bedtime. 30 tablet 0  . losartan (COZAAR) 100 MG tablet Take 1 tablet (100 mg total) by mouth daily. 30 tablet 1  . paliperidone (INVEGA SUSTENNA) 156 MG/ML SUSY injection Inject 1 mL (156 mg total) into the muscle every 30 (thirty) days. 1 mL 6  . Vitamin D, Ergocalciferol, (DRISDOL) 1.25 MG (50000 UNIT) CAPS capsule Take 50,000 Units by mouth every Monday.      Current Facility-Administered Medications  Medication Dose Route Frequency Provider Last Rate Last Admin  . paliperidone (INVEGA SUSTENNA) injection 156 mg  156 mg Intramuscular Q30 days Zena Amos, MD   156 mg at 12/13/20 1201     Musculoskeletal: Strength & Muscle Tone: within normal limits Gait & Station: normal Patient leans: N/A  Psychiatric Specialty Exam: Review of Systems  Psychiatric/Behavioral: Negative for agitation, decreased concentration, dysphoric mood, hallucinations, self-injury, sleep disturbance and suicidal ideas. The patient is not nervous/anxious and is not hyperactive.     There were no vitals taken for this visit.There is no height or weight on file to calculate BMI.  General Appearance: Fairly Groomed  Eye Contact:  Good  Speech:  Clear and Coherent and Normal Rate  Volume:  Normal  Mood:  Euthymic  Affect:  Appropriate  Thought Process:  Coherent and Descriptions of Associations: Intact  Orientation:  Full (Time, Place, and Person)   Thought Content: WDL   Suicidal Thoughts:  No  Homicidal Thoughts:  No  Memory:  Immediate;   Good Recent;   Good Remote;   Fair  Judgement:  Fair  Insight:  Fair  Psychomotor Activity:  Normal  Concentration:  Concentration: Good and Attention Span: Good  Recall:  [employer] of Knowledge: Fair  Language: Good  Akathisia:  NA  Handed:  Right  AIMS (if indicated): not done  Assets:  Communication Skills Desire for Improvement Financial Resources/Insurance Housing Social Support Transportation  ADL's:  Intact  Cognition: Impaired,  Mild  Sleep:  Good   Screenings: GAD-7   Flowsheet Row [facility] Visit from 12/13/2020 in [facility] Office Visit from 04/23/2019 in [facility]  Total GAD-7 Score 4 1    PHQ2-9   Flowsheet Row Office Visit from 12/13/2020 in [facility] from 03/14/2020 in [facility] Office Visit from 04/23/2019 in [facility]  PHQ-2 Total Score 1 2 0  PHQ-9 Total Score - 8 0    Flowsheet Row [facility]  C-SSRS RISK CATEGORY No Risk       Assessment and Plan:   Dylan Moon is a 25 year old male with a past psychiatric history significant for schizoaffective disorder (bipolar type) who presents to [facility] for follow-up and his Invega injection.  Patient reports no issues or concerns and states that he has been doing well with his Invega injections.  Patient will be receiving his Invega injection following the conclusion of the encounter.  1. Schizoaffective disorder, bipolar type [facility]) Patient will be receiving his paliperidone [name] Lorelei Pont) injection 156 mg  2. Unspecified intellectual disabilities  Provider will follow up with the patient during his next injection appointment  Meta Hatchet,  PA 12/13/2020, 3:03 PM

## 2020-12-14 ENCOUNTER — Ambulatory Visit (HOSPITAL_COMMUNITY): Payer: Medicaid Other

## 2021-01-10 ENCOUNTER — Encounter (HOSPITAL_COMMUNITY): Payer: Self-pay | Admitting: Physician Assistant

## 2021-01-10 ENCOUNTER — Ambulatory Visit (HOSPITAL_COMMUNITY): Payer: Self-pay

## 2021-01-11 ENCOUNTER — Encounter (HOSPITAL_COMMUNITY): Payer: Self-pay

## 2021-01-11 ENCOUNTER — Ambulatory Visit (INDEPENDENT_AMBULATORY_CARE_PROVIDER_SITE_OTHER): Payer: Medicaid Other | Admitting: *Deleted

## 2021-01-11 VITALS — BP 136/76 | HR 80 | Ht 69.0 in | Wt 220.0 lb

## 2021-01-11 DIAGNOSIS — F250 Schizoaffective disorder, bipolar type: Secondary | ICD-10-CM | POA: Diagnosis not present

## 2021-01-11 NOTE — Progress Notes (Signed)
Patient arrived for Injection 156 mg [place]. Patient pleasant not very talkative stated he was just tired. Tolerated injection well in Left Arm. NO HI/SI NOR AH/VH

## 2021-01-19 ENCOUNTER — Ambulatory Visit: Payer: Medicaid Other | Admitting: Family Medicine

## 2021-01-19 NOTE — Progress Notes (Deleted)
    SUBJECTIVE:   CHIEF COMPLAINT / HPI:   Mr. Dylan Moon is a 25 yo who presents to establish care  PMH: Schizoaffective disorder, bipolar type: Received 156mg  Invega Sustenna injection from [facility] on 4/21 SI/HI? Auditory or visual hallucinations?   Status epilepticus?   PSH:  Fam Hx  Meds:  Allergies:  Social: Cigarette Use Rec drug use Alcohol  Physical activity     OBJECTIVE:   There were no vitals taken for this visit.  ***  ASSESSMENT/PLAN:   No problem-specific Assessment & Plan notes found for this encounter.    BMP, lipid panel  SE of Invega include weight gain, increase in cholesterol, triglycerides, blood sugar   5/21, DO [facility]   {    This will disappear when note is signed, click to select method of visit    :1}

## 2021-02-06 ENCOUNTER — Other Ambulatory Visit: Payer: Self-pay | Admitting: Internal Medicine

## 2021-02-07 ENCOUNTER — Other Ambulatory Visit: Payer: Self-pay

## 2021-02-07 ENCOUNTER — Ambulatory Visit (INDEPENDENT_AMBULATORY_CARE_PROVIDER_SITE_OTHER): Payer: Medicaid Other | Admitting: Physician Assistant

## 2021-02-07 ENCOUNTER — Encounter (HOSPITAL_COMMUNITY): Payer: Self-pay | Admitting: Physician Assistant

## 2021-02-07 ENCOUNTER — Ambulatory Visit (INDEPENDENT_AMBULATORY_CARE_PROVIDER_SITE_OTHER): Payer: Medicaid Other | Admitting: *Deleted

## 2021-02-07 ENCOUNTER — Encounter (HOSPITAL_COMMUNITY): Payer: Self-pay

## 2021-02-07 VITALS — BP 140/75 | HR 88 | Ht 69.0 in | Wt 215.0 lb

## 2021-02-07 DIAGNOSIS — F250 Schizoaffective disorder, bipolar type: Secondary | ICD-10-CM | POA: Diagnosis not present

## 2021-02-07 DIAGNOSIS — F79 Unspecified intellectual disabilities: Secondary | ICD-10-CM

## 2021-02-07 LAB — CBC
HCT: 44.4 % (ref 38.5–50.0)
Hemoglobin: 14.6 g/dL (ref 13.2–17.1)
MCH: 27.5 pg (ref 27.0–33.0)
MCHC: 32.9 g/dL (ref 32.0–36.0)
MCV: 83.6 fL (ref 80.0–100.0)
MPV: 11.2 fL (ref 7.5–12.5)
Platelets: 209 10*3/uL (ref 140–400)
RBC: 5.31 10*6/uL (ref 4.20–5.80)
RDW: 13.3 % (ref 11.0–15.0)
WBC: 5.5 10*3/uL (ref 3.8–10.8)

## 2021-02-07 LAB — COMPLETE METABOLIC PANEL WITH GFR
AG Ratio: 2.1 (calc) (ref 1.0–2.5)
ALT: 14 U/L (ref 9–46)
AST: 14 U/L (ref 10–40)
Albumin: 4.9 g/dL (ref 3.6–5.1)
Alkaline phosphatase (APISO): 38 U/L (ref 36–130)
BUN: 12 mg/dL (ref 7–25)
CO2: 25 mmol/L (ref 20–32)
Calcium: 10.1 mg/dL (ref 8.6–10.3)
Chloride: 98 mmol/L (ref 98–110)
Creat: 1.09 mg/dL (ref 0.60–1.35)
GFR, Est African American: 108 mL/min/{1.73_m2} (ref >=60)
GFR, Est Non African American: 93 mL/min/{1.73_m2} (ref >=60)
Globulin: 2.3 g/dL (calc) (ref 1.9–3.7)
Glucose, Bld: 117 mg/dL — ABNORMAL HIGH (ref 65–99)
Potassium: 4 mmol/L (ref 3.5–5.3)
Sodium: 136 mmol/L (ref 135–146)
Total Bilirubin: 0.4 mg/dL (ref 0.2–1.2)
Total Protein: 7.2 g/dL (ref 6.1–8.1)

## 2021-02-07 LAB — LIPID PANEL
Cholesterol: 213 mg/dL — ABNORMAL HIGH (ref <200)
HDL: 44 mg/dL (ref >=40)
LDL Cholesterol (Calc): 142 mg/dL (calc) — ABNORMAL HIGH
Non-HDL Cholesterol (Calc): 169 mg/dL (calc) — ABNORMAL HIGH (ref <130)
Total CHOL/HDL Ratio: 4.8 (calc) (ref <5.0)
Triglycerides: 143 mg/dL (ref <150)

## 2021-02-07 LAB — VITAMIN D 25 HYDROXY (VIT D DEFICIENCY, FRACTURES): Vit D, 25-Hydroxy: 22 ng/mL — ABNORMAL LOW (ref 30–100)

## 2021-02-07 LAB — TSH: TSH: 2.22 mIU/L (ref 0.40–4.50)

## 2021-02-07 NOTE — Progress Notes (Signed)
BH MD/PA/NP OP Progress Note  02/07/2021 12:29 PM Dylan Moon  MRN:  [id number]  Chief Complaint: Follow up  HPI:   Dylan Moon is a 25 year old male with a past psychiatric history significant for schizoaffective disorder (bipolar type) who presents to [facility] for [place] injection as well as his follow-up appointment.  Patient reports that he is doing well and has no major issues or concerns regarding his mental health.  Patient expresses that he is tired of taking his medications that help prevent his seizures due to the size of the pills.  Patient denies any other issues today.  A PHQ-9 screen was performed with the patient scoring a 2.  A GAD-7 screen was also performed with the patient scoring a 3.  Patient is pleasant, calm, cooperative, and fully engaged in conversation during the encounter.  Patient reports that he is in a good mood today.  Patient denies suicidal or homicidal ideations.  He further denies auditory or visual hallucinations and does not appear to be responding to internal/external stimuli.  Patient endorses good sleep and receives on average 8 to 9 hours of sleep each day.  Patient endorses good appetite and eats on average 3-4 meals per day.  Patient denies alcohol consumption and tobacco use.  Patient does express that he uses marijuana roughly 5 days a week.   Visit Diagnosis:    ICD-10-CM   1. Schizoaffective disorder, bipolar type (HCC)  F25.0   2. Unspecified intellectual disabilities  F79     Past Psychiatric History:  Bipolar disorder versus Schizophrenia, unspecified intellectual disabilities  Past Medical History:  Past Medical History:  Diagnosis Date  . ADHD   . Scoliosis   . Seizures (HCC)    most recent 12/02/17    Past Surgical History:  Procedure Laterality Date  . NO PAST SURGERIES      Family Psychiatric History:  Unknown  Family History:  Family History  Problem Relation Age of Onset   . Diabetes Mother   . Hypertension Mother   . Cancer Other   . Diabetes Father   . Seizures Maternal Grandfather     Social History:  Social History   Socioeconomic History  . Marital status: Single    Spouse name: Not on file  . Number of children: 0  . Years of education: HS  . Highest education level: Not on file  Occupational History  . Occupation: Arboriculturist  Tobacco Use  . Smoking status: Never Smoker  . Smokeless tobacco: Never Used  Vaping Use  . Vaping Use: Never used  Substance and Sexual Activity  . Alcohol use: No  . Drug use: Yes    Types: Marijuana    Comment: Daily use of marijuana.  . Sexual activity: Not Currently  Other Topics Concern  . Not on file  Social History Narrative   Lives at home his mother.   3-4 sodas per week.   Right-handed.   Social Determinants of Health   Financial Resource Strain: Not on file  Food Insecurity: Not on file  Transportation Needs: Not on file  Physical Activity: Not on file  Stress: Not on file  Social Connections: Not on file    Allergies:  Allergies  Allergen Reactions  . Keppra [Levetiracetam] Other (See Comments)    Irritability   . Tramadol Other (See Comments)    Contraindicated with current medications (??)  . Vimpat [Lacosamide] Other (See Comments)    Causes  anger    Metabolic Disorder Labs: Lab Results  Component Value Date   HGBA1C 5.8 (H) 12/24/2019   MPG 119.76 12/24/2019   No results found for: PROLACTIN Lab Results  Component Value Date   CHOL 213 (H) 02/06/2021   TRIG 143 02/06/2021   HDL 44 02/06/2021   CHOLHDL 4.8 02/06/2021   VLDL 32 07/20/2020   LDLCALC 142 (H) 02/06/2021   LDLCALC 140 (H) 07/20/2020   Lab Results  Component Value Date   TSH 2.22 02/06/2021   TSH 2.226 07/10/2020    Therapeutic Level Labs: No results found for: LITHIUM Lab Results  Component Value Date   VALPROATE 63 07/20/2020   VALPROATE 95 07/12/2020   No components  found for:  CBMZ  Current Medications: Current Outpatient Medications  Medication Sig Dispense Refill  . ALPRAZolam (XANAX) 0.25 MG tablet Take 0.25 mg by mouth daily as needed for anxiety.    . APTIOM 800 MG TABS Take 1,600 mg by mouth daily. 30 tablet 0  . busPIRone (BUSPAR) 10 MG tablet Take 1 tablet (10 mg total) by mouth 2 (two) times daily.    . carvedilol (COREG) 6.25 MG tablet Take 1 tablet (6.25 mg total) by mouth 2 (two) times daily with a meal. 60 tablet 1  . divalproex (DEPAKOTE ER) 500 MG 24 hr tablet Take 4 tablets (2,000 mg total) by mouth daily. 120 tablet 0  . escitalopram (LEXAPRO) 10 MG tablet Take 1 tablet (10 mg total) by mouth at bedtime. 30 tablet 0  . losartan (COZAAR) 100 MG tablet Take 1 tablet (100 mg total) by mouth daily. 30 tablet 1  . paliperidone (INVEGA SUSTENNA) 156 MG/ML SUSY injection Inject 1 mL (156 mg total) into the muscle every 30 (thirty) days. 1 mL 6  . Vitamin D, Ergocalciferol, (DRISDOL) 1.25 MG (50000 UNIT) CAPS capsule Take 50,000 Units by mouth every Monday.      Current Facility-Administered Medications  Medication Dose Route Frequency Provider Last Rate Last Admin  . paliperidone (INVEGA SUSTENNA) injection 156 mg  156 mg Intramuscular Q30 days Zena Amos, MD   156 mg at 02/07/21 1132     Musculoskeletal: Strength & Muscle Tone: within normal limits Gait & Station: normal Patient leans: N/A   Psychiatric Specialty Exam: Review of Systems  Psychiatric/Behavioral: Negative for decreased concentration, dysphoric mood, hallucinations, self-injury, sleep disturbance and suicidal ideas. The patient is not nervous/anxious and is not hyperactive.     There were no vitals taken for this visit.There is no height or weight on file to calculate BMI.  General Appearance: Fairly Groomed  Eye Contact:  Good  Speech:  Clear and Coherent and Normal Rate  Volume:  Normal  Mood:  Euthymic  Affect:  Appropriate  Thought Process:  Coherent and  Descriptions of Associations: Intact  Orientation:  Full (Time, Place, and Person)  Thought Content: WDL   Suicidal Thoughts:  No  Homicidal Thoughts:  No  Memory:  Immediate;   Good Recent;   Good Remote;   Fair  Judgement:  Fair  Insight:  Fair  Psychomotor Activity:  Normal  Concentration:  Concentration: Good and Attention Span: Good  Recall:  [employer] of Knowledge: Fair  Language: Good  Akathisia:  NA  Handed:  Right  AIMS (if indicated): not done  Assets:  Communication Skills Desire for Improvement Financial Resources/Insurance Housing Social Support Transportation  ADL's:  Intact  Cognition: Impaired,  Mild  Sleep:  Good   Screenings: GAD-7  Flowsheet Row Office Visit from 02/07/2021 in [facility] Office Visit from 12/13/2020 in [facility] Office Visit from 04/23/2019 in [facility]  Total GAD-7 Score 3 4 1     PHQ2-9   Flowsheet Row Office Visit from 02/07/2021 in [facility] Office Visit from 12/13/2020 in [facility] ED from 03/14/2020 in [facility] Office Visit from 04/23/2019 in [facility]  PHQ-2 Total Score 2 1 2  0  PHQ-9 Total Score 2 -- 8 0    Flowsheet Row Office Visit from 02/07/2021 in [facility] ED from 04/08/2018 in [facility]  C-SSRS RISK CATEGORY No Risk No Risk       Assessment and Plan:   Dylan Moon is a 25 year old male with a past psychiatric history significant for schizoaffective disorder (bipolar type) who presents to [facility] for [place] injection as well as his follow-up appointment.  Patient reports no issues or concerns and states that his Invega injections have been going well.  Patient's next injection 03/07/2021.  1.  Schizoaffective disorder, bipolar type [facility]) Patient will be receiving his next paliperidone 03/09/2021 [facility]) injection 156 mg on 03/07/2021  2. Unspecified intellectual disabilities  Patient to follow-up in 2 months  Lorelei Pont, PA 02/07/2021, 12:29 PM

## 2021-02-07 NOTE — Progress Notes (Signed)
In with mom for his appt with both the provider and to get his shot. Today he received 156 mg Invega S in his R deltoid without difficulty. He is pleasant and verbal and offers no complaints. He reports and mom agrees he is doing well on the current medicine. Saw Eddie PA after he got his shot and is to return in one month for his next injection.

## 2021-02-08 ENCOUNTER — Ambulatory Visit (HOSPITAL_COMMUNITY): Payer: Medicaid Other

## 2021-02-20 ENCOUNTER — Encounter (HOSPITAL_COMMUNITY): Payer: Self-pay | Admitting: Student

## 2021-02-20 ENCOUNTER — Emergency Department (HOSPITAL_COMMUNITY)
Admission: EM | Admit: 2021-02-20 | Discharge: 2021-02-20 | Disposition: A | Discharge status: Home / Self Care | Payer: Medicaid Other | Attending: Emergency Medicine | Admitting: Emergency Medicine

## 2021-02-20 ENCOUNTER — Other Ambulatory Visit: Payer: Self-pay

## 2021-02-20 DIAGNOSIS — Z79899 Other long term (current) drug therapy: Secondary | ICD-10-CM | POA: Insufficient documentation

## 2021-02-20 DIAGNOSIS — Z20822 Contact with and (suspected) exposure to covid-19: Secondary | ICD-10-CM | POA: Diagnosis not present

## 2021-02-20 DIAGNOSIS — R569 Unspecified convulsions: Secondary | ICD-10-CM | POA: Diagnosis present

## 2021-02-20 DIAGNOSIS — G40909 Epilepsy, unspecified, not intractable, without status epilepticus: Secondary | ICD-10-CM | POA: Insufficient documentation

## 2021-02-20 MED ORDER — ACETAMINOPHEN 500 MG PO TABS
1000.0000 mg | ORAL_TABLET | Freq: Once | ORAL | Status: AC
  Administered 2021-02-20: 1000 mg via ORAL
  Filled 2021-02-20: qty 2

## 2021-02-20 MED ORDER — IBUPROFEN 800 MG PO TABS
800.0000 mg | ORAL_TABLET | Freq: Once | ORAL | Status: AC
  Administered 2021-02-20: 800 mg via ORAL
  Filled 2021-02-20: qty 1

## 2021-02-20 MED ORDER — DIVALPROEX SODIUM 500 MG PO DR TAB
500.0000 mg | DELAYED_RELEASE_TABLET | Freq: Once | ORAL | Status: AC
  Administered 2021-02-20: 500 mg via ORAL
  Filled 2021-02-20: qty 1

## 2021-02-20 MED ORDER — ONDANSETRON 4 MG PO TBDP
4.0000 mg | ORAL_TABLET | Freq: Once | ORAL | Status: AC
  Administered 2021-02-20: 4 mg via ORAL
  Filled 2021-02-20: qty 1

## 2021-02-20 NOTE — ED Triage Notes (Signed)
Patient BIB [facility] for witnessed seizure of unknown time.  Patient has a hx of seizures and doesn't always take his meds.  Patient arrives cussing staff and demanding water.  Patient is alert and oriented x4 and ambulatory on arrival.  [place], [place] at bedside.  Vitals were 154/p 102-HR 129-CBG 96% on room air

## 2021-02-20 NOTE — ED Notes (Signed)
Seizure pads were placed for pt's safety.  

## 2021-02-20 NOTE — ED Provider Notes (Signed)
[facility] Provider Note   CSN: [id number] Arrival date & time: 02/20/21  1442     History Chief Complaint  Patient presents with  . Seizures    Dylan Moon is a 25 y.o. male.  HPI Patient presents after witnessed seizure. Patient has a history of seizure disorder, states that he has been taking his Depakote regularly. Reportedly the patient had a witnessed seizure today.  He notes that he typically has some headache afterwards, and this is what he is experiencing currently.  He denies weakness anywhere else.  He voices some frustration having had water thus far, but otherwise denies new complaints. No recent medication change, diet change, activity change.    Past Medical History:  Diagnosis Date  . ADHD   . Scoliosis   . Seizures (HCC)    most recent 12/02/17    Patient Active Problem List   Diagnosis Date Noted  . Schizoaffective disorder, bipolar type (HCC) 08/09/2020  . Unspecified intellectual disabilities 08/09/2020  . Family discord 03/15/2020  . Aggressive behavior 03/15/2020  . HCAP (healthcare-associated pneumonia) 12/27/2019  . Status epilepticus (HCC) 12/24/2019  . Acute respiratory failure with hypoxemia (HCC)   . Seizures (HCC) 07/10/2018  . 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 Noncompliance with medication regimen 12/11/2017  . Seizure (HCC) 05/24/2016  . Dehydration, mild 05/24/2016  . AKI (acute kidney injury) (HCC) 05/24/2016  . Marijuana use 05/24/2016    Past Surgical History:  Procedure Laterality Date  . NO PAST SURGERIES         Family History  Problem Relation Age of Onset  . Diabetes Mother   . Hypertension Mother   . Cancer Other   . Diabetes Father   . Seizures Maternal Grandfather     Social History   Tobacco Use  . Smoking status: Never Smoker  . Smokeless tobacco: Never Used  Vaping Use  . Vaping Use: Never used  Substance Use Topics   . Alcohol use: No  . Drug use: Yes    Types: Marijuana    Comment: Daily use of marijuana.    Home Medications Prior to Admission medications   Medication Sig Start Date End Date Taking? Authorizing Provider  ALPRAZolam Prudy Feeler) 0.25 MG tablet Take 0.25 mg by mouth daily as needed for anxiety. 06/27/20   [provider]  APTIOM 800 MG TABS Take 1,600 mg by mouth daily. 07/20/20   Lanae Boast, MD  busPIRone (BUSPAR) 10 MG tablet Take 1 tablet (10 mg total) by mouth 2 (two) times daily. 07/18/20   Lanae Boast, MD  carvedilol (COREG) 6.25 MG tablet Take 1 tablet (6.25 mg total) by mouth 2 (two) times daily with a meal. 07/20/20 09/18/20  Lanae Boast, MD  divalproex (DEPAKOTE ER) 500 MG 24 hr tablet Take 4 tablets (2,000 mg total) by mouth daily. 07/20/20 08/19/20  Lanae Boast, MD  escitalopram (LEXAPRO) 10 MG tablet Take 1 tablet (10 mg total) by mouth at bedtime. 07/19/20   Starkes-Perry, Juel Burrow, FNP  losartan (COZAAR) 100 MG tablet Take 1 tablet (100 mg total) by mouth daily. 07/20/20 09/18/20  Lanae Boast, MD  paliperidone (INVEGA SUSTENNA) 156 MG/ML SUSY injection Inject 1 mL (156 mg total) into the muscle every 30 (thirty) days. 08/09/20   Zena Amos, MD  Vitamin D, Ergocalciferol, (DRISDOL) 1.25 MG (50000 UNIT) CAPS capsule Take 50,000 Units by mouth every Monday.  01/24/20   [provider]  Allergies    Keppra [levetiracetam], Tramadol, and Vimpat [lacosamide]  Review of Systems   Review of Systems  Constitutional:       Per HPI, otherwise negative  HENT:       Tongue laceration  Respiratory:       Per HPI, otherwise negative  Cardiovascular:       Per HPI, otherwise negative  Gastrointestinal: Negative for vomiting.  Endocrine:       Negative aside from HPI  Genitourinary:       Neg aside from HPI   Musculoskeletal:       Per HPI, otherwise negative  Skin: Negative.   Neurological: Positive for seizures. Negative for syncope.    Physical  Exam Updated Vital Signs BP (!) 142/81   Pulse 91   Temp 99.8 F (37.7 C) (Oral)   Resp 20   Ht 5\' 11"  (1.803 m)   Wt 99.8 kg   SpO2 100%   BMI 30.68 kg/m   Physical Exam Vitals and nursing note reviewed.  Constitutional:      General: He is not in acute distress.    Appearance: He is well-developed.  HENT:     Head: Normocephalic.   Eyes:     Conjunctiva/sclera: Conjunctivae normal.  Pulmonary:     Effort: Pulmonary effort is normal. No respiratory distress.     Breath sounds: No stridor.  Abdominal:     General: There is no distension.  Skin:    General: Skin is warm and dry.  Neurological:     Mental Status: He is alert and oriented to person, place, and time.     ED Results / Procedures / Treatments    Procedures Procedures   Medications Ordered in ED Medications  divalproex (DEPAKOTE) DR tablet 500 mg (500 mg Oral Given 02/20/21 1503)  ibuprofen (ADVIL) tablet 800 mg (800 mg Oral Given 02/20/21 1504)    ED Course  I have reviewed the triage vital signs and the nursing notes.  Pertinent labs & imaging results that were available during my care of the patient were reviewed by me and considered in my medical decision making (see chart for details).   Chart review notable for history of seizures, as well as psychiatric disease.  3:56 PM On repeat exam patient is hemodynamically unremarkable, is awake, alert, stating that he is ready to go home.  He has received ibuprofen, fluids orally, and Depakote.  Patient encouraged to follow-up with his physician for consideration of medication adjustments.  Without other complaints, per request, the patient discharged in stable condition.  Final Clinical Impression(s) / ED Diagnoses Final diagnoses:  Seizure [facility])    Rx / DC Orders ED Discharge Orders    None       [facility], MD 02/20/21 1556

## 2021-02-20 NOTE — Discharge Instructions (Addendum)
TodayAs discussed, it is important you follow-up with your physician at [facility].  The next available appointment.  Return here for concerning changes in your condition.

## 2021-02-21 LAB — SARS CORONAVIRUS 2 (TAT 6-24 HRS): SARS Coronavirus 2: NEGATIVE

## 2021-03-07 ENCOUNTER — Ambulatory Visit (HOSPITAL_COMMUNITY): Payer: Medicaid Other

## 2021-03-08 ENCOUNTER — Ambulatory Visit (HOSPITAL_COMMUNITY): Payer: Medicaid Other

## 2021-03-09 ENCOUNTER — Encounter (HOSPITAL_COMMUNITY): Payer: Self-pay

## 2021-03-09 ENCOUNTER — Other Ambulatory Visit: Payer: Self-pay

## 2021-03-09 ENCOUNTER — Ambulatory Visit (HOSPITAL_COMMUNITY): Payer: Medicaid Other | Admitting: *Deleted

## 2021-03-09 VITALS — BP 143/87 | HR 99 | Ht 71.0 in | Wt 217.0 lb

## 2021-03-09 DIAGNOSIS — F250 Schizoaffective disorder, bipolar type: Secondary | ICD-10-CM

## 2021-03-09 NOTE — Progress Notes (Signed)
In several days late for his monthly injection. He got his Invega S 156 inj in his R deltoid without difficulty. He came in earlier today but without his shot so he had to go get in and return . He has Medicaid. He denies any issues and offers no complaints. 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 He smells of marijuana today. He was told about the change in scheduling for shots and going to shot clinics and they would be on tues am and thurs pm. He does not work so should be able to work with the new times. He is to return in one month.

## 2021-04-01 ENCOUNTER — Other Ambulatory Visit (HOSPITAL_COMMUNITY): Payer: Self-pay | Admitting: Psychiatry

## 2021-04-01 DIAGNOSIS — F250 Schizoaffective disorder, bipolar type: Secondary | ICD-10-CM

## 2021-04-02 ENCOUNTER — Telehealth (HOSPITAL_COMMUNITY): Payer: Self-pay | Admitting: Physician Assistant

## 2021-04-02 NOTE — Telephone Encounter (Signed)
INVEGA refill request received via phone.

## 2021-04-03 ENCOUNTER — Other Ambulatory Visit (HOSPITAL_COMMUNITY): Payer: Self-pay | Admitting: Physician Assistant

## 2021-04-03 DIAGNOSIS — F250 Schizoaffective disorder, bipolar type: Secondary | ICD-10-CM

## 2021-04-03 MED ORDER — INVEGA SUSTENNA 156 MG/ML IM SUSY: 156.0000 mg | PREFILLED_SYRINGE | INTRAMUSCULAR | 6 refills | Status: DC

## 2021-04-03 NOTE — Progress Notes (Signed)
Provider was contacted by Wynona Luna, RN regarding medication refill. Patient's medication was e-prescribed to pharmacy of choice.

## 2021-04-03 NOTE — Telephone Encounter (Signed)
Patient's medication prescribed to preferred pharmacy

## 2021-04-04 ENCOUNTER — Encounter (HOSPITAL_COMMUNITY): Payer: Medicaid Other | Admitting: Physician Assistant

## 2021-04-06 NOTE — Telephone Encounter (Signed)
Refill order last sent on 04/03/2021. Message acknowledged and reviewed.

## 2021-04-10 ENCOUNTER — Ambulatory Visit (HOSPITAL_COMMUNITY): Payer: Medicaid Other

## 2021-04-13 ENCOUNTER — Encounter (HOSPITAL_COMMUNITY): Payer: Self-pay

## 2021-04-13 ENCOUNTER — Ambulatory Visit (INDEPENDENT_AMBULATORY_CARE_PROVIDER_SITE_OTHER): Payer: Medicaid Other | Admitting: *Deleted

## 2021-04-13 ENCOUNTER — Other Ambulatory Visit: Payer: Self-pay

## 2021-04-13 ENCOUNTER — Ambulatory Visit (INDEPENDENT_AMBULATORY_CARE_PROVIDER_SITE_OTHER): Payer: Medicaid Other | Admitting: Physician Assistant

## 2021-04-13 VITALS — BP 136/90 | HR 70 | Ht 71.0 in | Wt 220.0 lb

## 2021-04-13 DIAGNOSIS — F79 Unspecified intellectual disabilities: Secondary | ICD-10-CM | POA: Diagnosis not present

## 2021-04-13 DIAGNOSIS — F250 Schizoaffective disorder, bipolar type: Secondary | ICD-10-CM

## 2021-04-13 NOTE — Progress Notes (Signed)
BH MD/PA/NP OP Progress Note  04/13/2021 3:05 PM Dylan Moon  MRN:  [id number]  Chief Complaint: Follow up  HPI:   Dylan Moon is a 25 year old male with a past psychiatric history significant for schizoaffective disorder (bipolar type) who presents to [facility] for [place] injection as well as his follow-up appointment.  Patient reports that he is doing good and denies any issues with his Invega injection.  Patient denies depressive symptoms.  He endorses some anxiety and rates his anxiety a 5 out of 10.  Patient expresses that he feels like his time is being wasted due to not wanting to take the injection.  Patient reports no other issues or concerns regarding his mental health.  A GAD-7 screen was performed with the patient scoring a 2.  Patient is calm, cooperative, and fully engaged in conversation during the encounter.  Patient reports that he is doing great.  Patient denies suicidal or homicidal ideations.  He further denies auditory or visual hallucinations and does not appear to be responding to internal/external stimuli.  Patient endorses good sleep and receives on average 8 hours of sleep each day.  Patient endorses good appetite and eats on average 3 meals per day.  Patient denies alcohol consumption and tobacco use.  Patient does express that he uses marijuana roughly every day.  Visit Diagnosis:    ICD-10-CM   1. Schizoaffective disorder, bipolar type (HCC)  F25.0     2. Unspecified intellectual disabilities  F79       Past Psychiatric History:  Bipolar disorder versus Schizophrenia, unspecified intellectual disabilities  Past Medical History:  Past Medical History:  Diagnosis Date   ADHD    Scoliosis    Seizures (HCC)    most recent 12/02/17    Past Surgical History:  Procedure Laterality Date   NO PAST SURGERIES      Family Psychiatric History:  Unknown  Family History:  Family History  Problem Relation Age of  Onset   Diabetes Mother    Hypertension Mother    Cancer Other    Diabetes Father    Seizures Maternal Grandfather     Social History:  Social History   Socioeconomic History   Marital status: Single    Spouse name: Not on file   Number of children: 0   Years of education: HS   Highest education level: Not on file  Occupational History   Occupation: Product manager of videos  Tobacco Use   Smoking status: Never   Smokeless tobacco: Never  Vaping Use   Vaping Use: Never used  Substance and Sexual Activity   Alcohol use: No   Drug use: Yes    Types: Marijuana    Comment: Daily use of marijuana.   Sexual activity: Not Currently  Other Topics Concern   Not on file  Social History Narrative   Lives at home his mother.   3-4 sodas per week.   Right-handed.   Social Determinants of Health   Financial Resource Strain: Not on file  Food Insecurity: Not on file  Transportation Needs: Not on file  Physical Activity: Not on file  Stress: Not on file  Social Connections: Not on file    Allergies:  Allergies  Allergen Reactions   Keppra [Levetiracetam] Other (See Comments)    Irritability    Tramadol Other (See Comments)    Contraindicated with current medications (??)   Vimpat [Lacosamide] Other (See Comments)    Causes anger  Metabolic Disorder Labs: Lab Results  Component Value Date   HGBA1C 5.8 (H) 12/24/2019   MPG 119.76 12/24/2019   No results found for: PROLACTIN Lab Results  Component Value Date   CHOL 213 (H) 02/06/2021   TRIG 143 02/06/2021   HDL 44 02/06/2021   CHOLHDL 4.8 02/06/2021   VLDL 32 07/20/2020   LDLCALC 142 (H) 02/06/2021   LDLCALC 140 (H) 07/20/2020   Lab Results  Component Value Date   TSH 2.22 02/06/2021   TSH 2.226 07/10/2020    Therapeutic Level Labs: No results found for: LITHIUM Lab Results  Component Value Date   VALPROATE 63 07/20/2020   VALPROATE 95 07/12/2020   No components found for:  CBMZ  Current  Medications: Current Outpatient Medications  Medication Sig Dispense Refill   ALPRAZolam (XANAX) 0.25 MG tablet Take 0.25 mg by mouth daily as needed for anxiety.     APTIOM 800 MG TABS Take 1,600 mg by mouth daily. 30 tablet 0   busPIRone (BUSPAR) 10 MG tablet Take 1 tablet (10 mg total) by mouth 2 (two) times daily.     carvedilol (COREG) 6.25 MG tablet Take 1 tablet (6.25 mg total) by mouth 2 (two) times daily with a meal. 60 tablet 1   divalproex (DEPAKOTE ER) 500 MG 24 hr tablet Take 4 tablets (2,000 mg total) by mouth daily. 120 tablet 0   escitalopram (LEXAPRO) 10 MG tablet Take 1 tablet (10 mg total) by mouth at bedtime. 30 tablet 0   losartan (COZAAR) 100 MG tablet Take 1 tablet (100 mg total) by mouth daily. 30 tablet 1   paliperidone (INVEGA SUSTENNA) 156 MG/ML SUSY injection Inject 1 mL (156 mg total) into the muscle every 30 (thirty) days. 1 mL 6   Vitamin D, Ergocalciferol, (DRISDOL) 1.25 MG (50000 UNIT) CAPS capsule Take 50,000 Units by mouth every Monday.      Current Facility-Administered Medications  Medication Dose Route Frequency Provider Last Rate Last Admin   paliperidone (INVEGA SUSTENNA) injection 156 mg  156 mg Intramuscular Q30 days Zena Amos, MD   156 mg at 04/13/21 1236     Musculoskeletal: Strength & Muscle Tone: within normal limits Gait & Station: normal Patient leans: N/A   Psychiatric Specialty Exam: Review of Systems  Psychiatric/Behavioral:  Negative for decreased concentration, dysphoric mood, hallucinations, self-injury, sleep disturbance and suicidal ideas. The patient is not nervous/anxious and is not hyperactive.    There were no vitals taken for this visit.There is no height or weight on file to calculate BMI.  General Appearance: Fairly Groomed  Eye Contact:  Good  Speech:  Clear and Coherent and Normal Rate  Volume:  Normal  Mood:  Euthymic  Affect:  Appropriate  Thought Process:  Coherent and Descriptions of Associations: Intact   Orientation:  Full (Time, Place, and Person)  Thought Content: WDL   Suicidal Thoughts:  No  Homicidal Thoughts:  No  Memory:  Immediate;   Good Recent;   Good Remote;   Fair  Judgement:  Fair  Insight:  Fair  Psychomotor Activity:  Normal  Concentration:  Concentration: Good and Attention Span: Good  Recall:  [employer] of Knowledge: Fair  Language: Good  Akathisia:  NA  Handed:  Right  AIMS (if indicated): not done  Assets:  Communication Skills Desire for Improvement Financial Resources/Insurance Housing Social Support Transportation  ADL's:  Intact  Cognition: Impaired,  Mild  Sleep:  Good   Screenings: GAD-7    Flowsheet Row  Office Visit from 04/13/2021 in [facility] Office Visit from 02/07/2021 in [facility] Office Visit from 12/13/2020 in [facility] Office Visit from 04/23/2019 in [facility]  Total GAD-7 Score 2 3 4 1       PHQ2-9    Flowsheet Row Office Visit from 04/13/2021 in [facility] Office Visit from 02/07/2021 in [facility] Office Visit from 12/13/2020 in [facility] ED from 03/14/2020 in [facility] Office Visit from 04/23/2019 in [facility]  PHQ-2 Total Score 0 2 1 2  0  PHQ-9 Total Score -- 2 -- 8 0      Flowsheet Row Office Visit from 04/13/2021 in [facility] ED from 02/20/2021 in [facility] Office Visit from 02/07/2021 in [facility]  C-SSRS RISK CATEGORY No Risk No Risk No Risk        Assessment and Plan:   Dylan Moon is a 25 year old male with a past psychiatric history significant for schizoaffective disorder (bipolar type) who presents to [facility] for [place] injection as well as his follow-up appointment.  Patient reports no issues or concerns with his Invega injection but does express that he does not want to continue taking the medication.  Patient's next injection is scheduled for 05/11/2021  Patient was recommended marijuana cessation.  Patient vocalized understanding  1. Schizoaffective disorder, bipolar type [facility]) Patient will be receiving his next paliperidone 05/13/2021 [facility]) injection 156 mg on 05/11/2021  2. Unspecified intellectual disabilities  Patient to follow-up in 2 months  Lorelei Pont, PA 04/13/2021, 3:05 PM

## 2021-04-13 NOTE — Progress Notes (Signed)
Patient Arrived  late for injection today 12:15 pm paliperidone (INVEGA SUSTENNA) injection 156 mg. Remind of office hours. Patient chief complaint he doesn't want to take the shot . Stating that he doesn't have Schizophrenia. Tried to encourage that the shot helps to keep his thoughts clear &  to focus better. Patient kept saying he's going to tell his Mother he's not going to keep taking the shot. I did say people who have high blood pressure take medicine to stay healthy so they wont have a heart attack of stroke. He replied with a smile.

## 2021-04-14 ENCOUNTER — Encounter (HOSPITAL_COMMUNITY): Payer: Self-pay | Admitting: Physician Assistant

## 2021-04-17 ENCOUNTER — Encounter (HOSPITAL_COMMUNITY): Payer: Self-pay

## 2021-04-17 ENCOUNTER — Emergency Department (HOSPITAL_COMMUNITY): Payer: Medicaid Other

## 2021-04-17 ENCOUNTER — Emergency Department (HOSPITAL_COMMUNITY)
Admission: EM | Admit: 2021-04-17 | Discharge: 2021-04-17 | Disposition: A | Discharge status: Home / Self Care | Payer: Medicaid Other | Attending: Emergency Medicine | Admitting: Emergency Medicine

## 2021-04-17 DIAGNOSIS — R000 Tachycardia, unspecified: Secondary | ICD-10-CM | POA: Insufficient documentation

## 2021-04-17 DIAGNOSIS — Z20822 Contact with and (suspected) exposure to covid-19: Secondary | ICD-10-CM | POA: Insufficient documentation

## 2021-04-17 DIAGNOSIS — Y900 Blood alcohol level of less than 20 mg/100 ml: Secondary | ICD-10-CM | POA: Insufficient documentation

## 2021-04-17 DIAGNOSIS — R519 Headache, unspecified: Secondary | ICD-10-CM | POA: Diagnosis not present

## 2021-04-17 DIAGNOSIS — R569 Unspecified convulsions: Secondary | ICD-10-CM | POA: Insufficient documentation

## 2021-04-17 DIAGNOSIS — Z79899 Other long term (current) drug therapy: Secondary | ICD-10-CM | POA: Insufficient documentation

## 2021-04-17 LAB — BASIC METABOLIC PANEL
Anion gap: 12 (ref 5–15)
BUN: 9 mg/dL (ref 6–20)
CO2: 23 mmol/L (ref 22–32)
Calcium: 9.6 mg/dL (ref 8.9–10.3)
Chloride: 100 mmol/L (ref 98–111)
Creatinine, Ser: 1.15 mg/dL (ref 0.61–1.24)
GFR, Estimated: >60 mL/min (ref >60)
Glucose, Bld: 128 mg/dL — ABNORMAL HIGH (ref 70–99)
Potassium: 5.2 mmol/L — ABNORMAL HIGH (ref 3.5–5.1)
Sodium: 135 mmol/L (ref 135–145)

## 2021-04-17 LAB — URINALYSIS, ROUTINE W REFLEX MICROSCOPIC
Bilirubin Urine: NEGATIVE
Glucose, UA: NEGATIVE mg/dL
Hgb urine dipstick: NEGATIVE
Ketones, ur: NEGATIVE mg/dL
Leukocytes,Ua: NEGATIVE
Nitrite: NEGATIVE
Protein, ur: NEGATIVE mg/dL
Specific Gravity, Urine: 1.016 (ref 1.005–1.030)
pH: 7 (ref 5.0–8.0)

## 2021-04-17 LAB — CBC WITH DIFFERENTIAL/PLATELET
Abs Immature Granulocytes: 0.02 10*3/uL (ref 0.00–0.07)
Basophils Absolute: 0 10*3/uL (ref 0.0–0.1)
Basophils Relative: 0 %
Eosinophils Absolute: 0 10*3/uL (ref 0.0–0.5)
Eosinophils Relative: 0 %
HCT: 47 % (ref 39.0–52.0)
Hemoglobin: 15.4 g/dL (ref 13.0–17.0)
Immature Granulocytes: 0 %
Lymphocytes Relative: 34 %
Lymphs Abs: 1.6 10*3/uL (ref 0.7–4.0)
MCH: 28 pg (ref 26.0–34.0)
MCHC: 32.8 g/dL (ref 30.0–36.0)
MCV: 85.5 fL (ref 80.0–100.0)
Monocytes Absolute: 0.4 10*3/uL (ref 0.1–1.0)
Monocytes Relative: 8 %
Neutro Abs: 2.8 10*3/uL (ref 1.7–7.7)
Neutrophils Relative %: 58 %
Platelets: 214 10*3/uL (ref 150–400)
RBC: 5.5 MIL/uL (ref 4.22–5.81)
RDW: 13.2 % (ref 11.5–15.5)
WBC: 4.8 10*3/uL (ref 4.0–10.5)
nRBC: 0 % (ref 0.0–0.2)

## 2021-04-17 LAB — RESP PANEL BY RT-PCR (FLU A&B, COVID) ARPGX2
Influenza A by PCR: NEGATIVE
Influenza B by PCR: NEGATIVE
SARS Coronavirus 2 by RT PCR: NEGATIVE

## 2021-04-17 LAB — MAGNESIUM: Magnesium: 2.1 mg/dL (ref 1.7–2.4)

## 2021-04-17 LAB — RAPID URINE DRUG SCREEN, HOSP PERFORMED
Amphetamines: NOT DETECTED
Barbiturates: NOT DETECTED
Benzodiazepines: POSITIVE — AB
Cocaine: NOT DETECTED
Opiates: NOT DETECTED
Tetrahydrocannabinol: POSITIVE — AB

## 2021-04-17 LAB — VALPROIC ACID LEVEL: Valproic Acid Lvl: 49 ug/mL — ABNORMAL LOW (ref 50.0–100.0)

## 2021-04-17 LAB — ETHANOL: Alcohol, Ethyl (B): <10 mg/dL (ref <10)

## 2021-04-17 MED ORDER — SODIUM CHLORIDE 0.9 % IV BOLUS
1000.0000 mL | Freq: Once | INTRAVENOUS | Status: AC
  Administered 2021-04-17: 1000 mL via INTRAVENOUS

## 2021-04-17 MED ORDER — KETOROLAC TROMETHAMINE 30 MG/ML IJ SOLN
30.0000 mg | Freq: Once | INTRAMUSCULAR | Status: AC
  Administered 2021-04-17: 30 mg via INTRAVENOUS
  Filled 2021-04-17: qty 1

## 2021-04-17 MED ORDER — NAYZILAM 5 MG/0.1ML NA SOLN: 5.0000 mg | Freq: Every day | NASAL | 2 refills | Status: DC | PRN

## 2021-04-17 MED ORDER — ONDANSETRON HCL 4 MG/2ML IJ SOLN
4.0000 mg | Freq: Once | INTRAMUSCULAR | Status: AC
  Administered 2021-04-17: 4 mg via INTRAVENOUS
  Filled 2021-04-17: qty 2

## 2021-04-17 MED ORDER — ACETAMINOPHEN 500 MG PO TABS
1000.0000 mg | ORAL_TABLET | Freq: Once | ORAL | Status: AC
  Administered 2021-04-17: 1000 mg via ORAL
  Filled 2021-04-17: qty 2

## 2021-04-17 MED ORDER — VALPROATE SODIUM 100 MG/ML IV SOLN
500.0000 mg | Freq: Once | INTRAVENOUS | Status: AC
  Administered 2021-04-17: 500 mg via INTRAVENOUS
  Filled 2021-04-17: qty 5

## 2021-04-17 NOTE — ED Provider Notes (Signed)
Patient signed out to me by previous provider.  Please refer to their note for full HPI.  Brief this is a 25 year old male with history of frequent breakthrough seizures who presents the emergency department after witnessed seizure at home, subsided with intranasal Versed, no further seizure-like activity.  Work-up has been reassuring with a slightly low valproic acid level with patient admittedly being noncompliant.  He was loaded with Depakote here.  Pending urinalysis to rule out UTI. Physical Exam  BP 136/83   Pulse 85   Temp 98.8 F (37.1 C) (Oral)   Resp (!) 24   SpO2 99%   Physical Exam Vitals and nursing note reviewed.  Constitutional:      Appearance: Normal appearance.  HENT:     Head: Normocephalic.     Mouth/Throat:     Mouth: Mucous membranes are moist.  Cardiovascular:     Rate and Rhythm: Normal rate.  Pulmonary:     Effort: Pulmonary effort is normal. No respiratory distress.  Abdominal:     Palpations: Abdomen is soft.     Tenderness: There is no abdominal tenderness.  Skin:    General: Skin is warm.  Neurological:     Mental Status: He is alert and oriented to person, place, and time. Mental status is at baseline.  Psychiatric:        Mood and Affect: Mood normal.    ED Course/Procedures   Clinical Course as of 04/17/21 1642  Tue Apr 17, 2021  1512 Signed out to Dr Wilkie Aye EDP pending f/u on covid/UA for infectious and UDS workup.  If stable following this, the patient can be discharged home.  Versed IN prescription resent to pharmacy.  GF present at bedside for history and plan, and I also spoke to his mother [name] by phone. [MT]    Clinical Course User Index [MT] Terald Sleeper, MD    Procedures  MDM   Urinalysis shows no UTI, vitals remained stable, patient is standing in the room, requesting to be discharged, offers no new complaints.  Patient at this time appears safe and stable for discharge and will be treated as an outpatient.  Discharge  plan and strict return to ED precautions discussed, patient verbalizes understanding and agreement.       Rozelle Logan, DO 04/17/21 1643

## 2021-04-17 NOTE — ED Triage Notes (Signed)
BIB [facility] after family called reporting seizure. Pt has hx of same and family administered 10mg  of versed IN in 2  5mg  doses. Pt was reported to be postictal when EMS arrived and not seizing. Pt maintained airway throughout transport and answered all questions for EMS en route. Pt takes Depakote and has electrical stimulator for management. Pt has not had Depakote today.

## 2021-04-17 NOTE — ED Provider Notes (Signed)
[facility] EMERGENCY DEPARTMENT Provider Note   CSN: [id number] Arrival date & time: 04/17/21  1307     History Chief Complaint  Patient presents with   Seizures    Dylan Moon is a 25 y.o. male w/ hx of schizoaffective disorder, seizures, on Depakote, presenting to ED with seizure. Per EMS, the patient had a 10 minute generalized tonic-clonic seizure at home witnessed by family or sig other.  Patient was given 5 mg IN versed x 2 doses by family members with cessation of seizure.  Patient post-ictal on EMS arrival, but following commands.  Update - additional hx provided by girlfriend later in ED course.  She reports she lives with patient.  He was sitting in bed reporting he felt shaky, then began having generalized shaking of arms and legs, which she states looked like his prior witnessed seizures.  She gave 2 back-to-back doses of IN versed.  Seizure lasted about 10 minutes and abated by the time EMS arrived.  She reports he's back to baseline mental status.  ON arrival patient reports headache.  Denies recent fevers, chills, cough, congestion, or any other symptoms the past few days.  Denies known head trauma.  Reports he has a seizure "once every few months."  Reports he did not take AED this morning but normally is compliant with it.  He had a VNS stimulator placed on 09/13/20.  Hx of intubation for status epilepitucs in April 2022, felt to be 2/2 subtherapeutic AED/medication noncompliance.  Last seen by neurologist Horatio Pel on 04/03/21 with office visit, who notes: " -Per outside ER visits, appears to have been to the ER about every 2 to 3 months for seizure. Multiple times patient has been subtherapeutic on medication. Patient admits that he will forget to take medication about 2 to 3 days a week. He notes that he does not like to take so many medications. Mother notes that even when he takes medication he has had breakthrough seizures in the  past."  HPI     Past Medical History:  Diagnosis Date   ADHD    Scoliosis    Seizures (HCC)    most recent 12/02/17    Patient Active Problem List   Diagnosis Date Noted   Schizoaffective disorder, bipolar type (HCC) 08/09/2020   Unspecified intellectual disabilities 08/09/2020   Family discord 03/15/2020   Aggressive behavior 03/15/2020   HCAP (healthcare-associated pneumonia) 12/27/2019   Status epilepticus (HCC) 12/24/2019   Acute respiratory failure with hypoxemia (HCC)    Seizures (HCC) 07/10/2018   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   Noncompliance with medication regimen 12/11/2017   Seizure (HCC) 05/24/2016   Dehydration, mild 05/24/2016   AKI (acute kidney injury) (HCC) 05/24/2016   Marijuana use 05/24/2016    Past Surgical History:  Procedure Laterality Date   NO PAST SURGERIES         Family History  Problem Relation Age of Onset   Diabetes Mother    Hypertension Mother    Cancer Other    Diabetes Father    Seizures Maternal Grandfather     Social History   Tobacco Use   Smoking status: Never   Smokeless tobacco: Never  Vaping Use   Vaping Use: Never used  Substance Use Topics   Alcohol use: No   Drug use: Yes    Types: Marijuana    Comment: Daily use of marijuana.    Home Medications Prior to Admission medications  Medication Sig Start Date End Date Taking? Authorizing Provider  ALPRAZolam Prudy Feeler) 0.25 MG tablet Take 0.25 mg by mouth daily as needed for anxiety. 06/27/20   [provider]  APTIOM 800 MG TABS Take 1,600 mg by mouth daily. 07/20/20   Lanae Boast, MD  busPIRone (BUSPAR) 10 MG tablet Take 1 tablet (10 mg total) by mouth 2 (two) times daily. 07/18/20   Lanae Boast, MD  carvedilol (COREG) 6.25 MG tablet Take 1 tablet (6.25 mg total) by mouth 2 (two) times daily with a meal. 07/20/20 09/18/20  Lanae Boast, MD  divalproex (DEPAKOTE ER) 500 MG 24 hr tablet Take 4 tablets (2,000 mg  total) by mouth daily. 07/20/20 08/19/20  Lanae Boast, MD  escitalopram (LEXAPRO) 10 MG tablet Take 1 tablet (10 mg total) by mouth at bedtime. 07/19/20   Starkes-Perry, Juel Burrow, FNP  losartan (COZAAR) 100 MG tablet Take 1 tablet (100 mg total) by mouth daily. 07/20/20 09/18/20  Lanae Boast, MD  paliperidone (INVEGA SUSTENNA) 156 MG/ML SUSY injection Inject 1 mL (156 mg total) into the muscle every 30 (thirty) days. 04/03/21   Nwoko, Tommas Olp, PA  Vitamin D, Ergocalciferol, (DRISDOL) 1.25 MG (50000 UNIT) CAPS capsule Take 50,000 Units by mouth every Monday.  01/24/20   [provider]    Allergies    Keppra [levetiracetam], Tramadol, and Vimpat [lacosamide]  Review of Systems   Review of Systems  Constitutional:  Negative for chills and fever.  Eyes:  Negative for photophobia and visual disturbance.  Respiratory:  Negative for cough and shortness of breath.   Cardiovascular:  Negative for chest pain and palpitations.  Gastrointestinal:  Negative for abdominal pain and vomiting.  Genitourinary:  Negative for dysuria and hematuria.  Musculoskeletal:  Positive for arthralgias and myalgias.  Skin:  Negative for color change and rash.  Neurological:  Positive for seizures and headaches.  All other systems reviewed and are negative.  Physical Exam Updated Vital Signs There were no vitals taken for this visit.  Physical Exam Constitutional:      General: He is not in acute distress. HENT:     Head: Normocephalic and atraumatic.  Eyes:     Conjunctiva/sclera: Conjunctivae normal.     Pupils: Pupils are equal, round, and reactive to light.  Cardiovascular:     Rate and Rhythm: Regular rhythm. Tachycardia present.  Pulmonary:     Effort: Pulmonary effort is normal. No respiratory distress.  Abdominal:     General: There is no distension.     Tenderness: There is no abdominal tenderness.  Skin:    General: Skin is warm and dry.  Neurological:     General: No focal deficit  present.     Mental Status: He is alert and oriented to person, place, and time. Mental status is at baseline.     Sensory: No sensory deficit.     Motor: No weakness.    ED Results / Procedures / Treatments   Labs (all labs ordered are listed, but only abnormal results are displayed) Labs Reviewed - No data to display  EKG None  Radiology No results found.  Procedures Procedures   Medications Ordered in ED Medications - No data to display  ED Course  I have reviewed the triage vital signs and the nursing notes.  Pertinent labs & imaging results that were available during my care of the patient were reviewed by me and considered in my medical decision making (see chart for details).  Patient here s/p  seizure, treated with IN versed by girlfriend at home.  Patient awake and conversant on arrival.  This does not appear to be status epilepticus.  Reporting headache which he says is worse than usual after a seizure.  Per my medical record review there does appear to be a history of AED noncompliance, and he did not take it today.  We'll give some IV depakote and send a level.  Will check also for infection or sign of ICH/injury with CTH, as well as UDS.  IV fluids and IV toradol for headache and tachycardia, IV zofran for nausea.  CBC and BMP reviewed - no significant findings, mild hyperK, no acute findings on ECG  Depakote level 49, only minor subtherapeutic.  Mg 2.1.  Etoh negative.  On reassessment he reports significant improvement of headache, feeling well and asking to go home.  I advised waiting to complete the workup and continued monitoring for a period in the ED.  If he is otherwise stable, he can be discharged with family and instructions to f/u with his neurologist for this break-through seizure.    UA and UDS pending. Covid pending   Clinical Course as of 04/17/21 1611  Tue Apr 17, 2021  1512 Signed out to Dr Wilkie Aye EDP pending f/u on covid/UA for infectious  and UDS workup.  If stable following this, the patient can be discharged home.  Versed IN prescription resent to pharmacy.  GF present at bedside for history and plan, and I also spoke to his mother [name] by phone. [MT]    Clinical Course User Index [MT] Jaque Dacy, Kermit Balo, MD   Final Clinical Impression(s) / ED Diagnoses Final diagnoses:  None    Rx / DC Orders ED Discharge Orders     None        Terald Sleeper, MD 04/17/21 1616

## 2021-04-17 NOTE — Discharge Instructions (Addendum)
Please call Dr Baldo Daub office tomorrow to schedule a follow up appointment for your seizure today.  You should avoid driving for 6 months or until you are cleared to do so by your neurologist.  I sent another prescription for your nasal versed medication to the pharmacy.  Please be sure you have at least 1 dose of this at home with Yisroel at all times.

## 2021-04-17 NOTE — ED Notes (Signed)
Pt transported to CT ?

## 2021-04-26 NOTE — Telephone Encounter (Signed)
Message reviewed and acknowledged.

## 2021-05-10 ENCOUNTER — Ambulatory Visit (HOSPITAL_COMMUNITY): Payer: Medicaid Other

## 2021-05-11 ENCOUNTER — Ambulatory Visit (HOSPITAL_COMMUNITY): Payer: Medicaid Other

## 2021-05-15 ENCOUNTER — Encounter (HOSPITAL_COMMUNITY): Payer: Self-pay

## 2021-05-15 ENCOUNTER — Other Ambulatory Visit: Payer: Self-pay

## 2021-05-15 ENCOUNTER — Ambulatory Visit (INDEPENDENT_AMBULATORY_CARE_PROVIDER_SITE_OTHER): Payer: Medicaid Other | Admitting: *Deleted

## 2021-05-15 VITALS — BP 137/83 | HR 83 | Ht 71.0 in | Wt 222.0 lb

## 2021-05-15 DIAGNOSIS — F250 Schizoaffective disorder, bipolar type: Secondary | ICD-10-CM

## 2021-05-15 NOTE — Progress Notes (Signed)
Patient arrived today for injection & is a bland mood still stating he's not taking the medication. He's very bothered by the diagnosis Schizoaffective disorder. Keep encouraging him how well he's doing. Patient keeps stating he' not have schizophrenia & he goggled the diagnosis & he doesn't have that(what he read). After getting injection patient walked off followed by Mom who stated she will make his F/U appointment for next injection. Patient tolerated injection well in Right-Arm.

## 2021-06-01 ENCOUNTER — Other Ambulatory Visit: Payer: Self-pay

## 2021-06-01 ENCOUNTER — Telehealth (INDEPENDENT_AMBULATORY_CARE_PROVIDER_SITE_OTHER): Payer: Medicaid Other | Admitting: Physician Assistant

## 2021-06-01 ENCOUNTER — Encounter (HOSPITAL_COMMUNITY): Payer: Self-pay | Admitting: Physician Assistant

## 2021-06-01 DIAGNOSIS — F79 Unspecified intellectual disabilities: Secondary | ICD-10-CM | POA: Diagnosis not present

## 2021-06-01 DIAGNOSIS — F250 Schizoaffective disorder, bipolar type: Secondary | ICD-10-CM | POA: Diagnosis not present

## 2021-06-01 NOTE — Progress Notes (Addendum)
BH MD/PA/NP OP Progress Note  Virtual Visit via Telephone Note  I connected with Dylan Moon on 06/01/21 at 11:30 AM EDT by telephone and verified that I am speaking with the correct person using two identifiers.  Location: Patient: Home Provider: Clinic   I discussed the limitations, risks, security and privacy concerns of performing an evaluation and management service by telephone and the availability of in person appointments. I also discussed with the patient that there may be a patient responsible charge related to this service. The patient expressed understanding and agreed to proceed.  Follow Up Instructions:  I discussed the assessment and treatment plan with the patient. The patient was provided an opportunity to ask questions and all were answered. The patient agreed with the plan and demonstrated an understanding of the instructions.   The patient was advised to call back or seek an in-person evaluation if the symptoms worsen or if the condition fails to improve as anticipated.  I provided 15 minutes of non-face-to-face time during this encounter.  Meta Hatchet, PA   06/01/2021 7:46 PM Dylan Moon  MRN:  [id number]  Chief Complaint: Follow up and medication management  HPI:   Dylan Moon is a 25 year old male with a past psychiatric history significant for schizoaffective disorder (bipolar type) and unspecified intellectual disabilities who presents to [facility] for follow-up and medication management.  Patient is being managed on the following medication: Paliperidone [name] Lorelei Pont) 156 mg IM injection.  Patient reports no issues or concerns regarding his [place] injection.  Patient denies experiencing any adverse side effects from taking the medication.  Patient denies any depressive symptoms and states that his anxiety has been minimal.  Patient denies any stressors at this time.  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  Patient is alert and oriented and does not appear to be in any acute distress.  Patient answers all questions addressed to him during the encounter.  Patient endorses good mood.  Patient denies suicidal or homicidal ideations.  He further denies auditory or visual hallucinations and does not appear to be responding to internal/external stimuli.  Patient endorses good sleep and receives on average 7 to 8 hours of sleep each night.  Patient endorses good appetite and eats on average 3 meals per day.  Patient denies alcohol consumption, tobacco use, and illicit drug use.  Visit Diagnosis:    ICD-10-CM   1. Schizoaffective disorder, bipolar type (HCC)  F25.0     2. Unspecified intellectual disabilities  F79       Past Psychiatric History:  Bipolar disorder versus Schizophrenia, unspecified intellectual disabilities  Past Medical History:  Past Medical History:  Diagnosis Date   ADHD    Scoliosis    Seizures (HCC)    most recent 12/02/17    Past Surgical History:  Procedure Laterality Date   NO PAST SURGERIES      Family Psychiatric History:  Unknown  Family History:  Family History  Problem Relation Age of Onset   Diabetes Mother    Hypertension Mother    Cancer Other    Diabetes Father    Seizures Maternal Grandfather     Social History:  Social History   Socioeconomic History   Marital status: Single    Spouse name: Not on file   Number of children: 0   Years of education: HS   Highest education level: Not on file  Occupational History   Occupation: Arboriculturist  Tobacco Use   Smoking status: Never   Smokeless tobacco: Never  Vaping Use   Vaping Use: Never used  Substance and Sexual Activity   Alcohol use: No   Drug use: Yes    Types: Marijuana    Comment: Daily use of marijuana.   Sexual activity: Not Currently  Other Topics Concern   Not on file  Social History Narrative    Lives at home his mother.   3-4 sodas per week.   Right-handed.   Social Determinants of Health   Financial Resource Strain: Not on file  Food Insecurity: Not on file  Transportation Needs: Not on file  Physical Activity: Not on file  Stress: Not on file  Social Connections: Not on file    Allergies:  Allergies  Allergen Reactions   Keppra [Levetiracetam] Other (See Comments)    Irritability    Tramadol Other (See Comments)    Contraindicated with current medications (??)   Vimpat [Lacosamide] Other (See Comments)    Causes anger    Metabolic Disorder Labs: Lab Results  Component Value Date   HGBA1C 5.8 (H) 12/24/2019   MPG 119.76 12/24/2019   No results found for: PROLACTIN Lab Results  Component Value Date   CHOL 213 (H) 02/06/2021   TRIG 143 02/06/2021   HDL 44 02/06/2021   CHOLHDL 4.8 02/06/2021   VLDL 32 07/20/2020   LDLCALC 142 (H) 02/06/2021   LDLCALC 140 (H) 07/20/2020   Lab Results  Component Value Date   TSH 2.22 02/06/2021   TSH 2.226 07/10/2020    Therapeutic Level Labs: No results found for: LITHIUM Lab Results  Component Value Date   VALPROATE 49 (L) 04/17/2021   VALPROATE 63 07/20/2020   No components found for:  CBMZ  Current Medications: Current Outpatient Medications  Medication Sig Dispense Refill   acetaminophen (TYLENOL) 500 MG tablet Take 500 mg by mouth every 6 (six) hours as needed for mild pain or headache.     ALPRAZolam (XANAX) 0.25 MG tablet Take 0.25 mg by mouth daily as needed for anxiety.     APTIOM 800 MG TABS Take 1,600 mg by mouth daily. 30 tablet 0   busPIRone (BUSPAR) 10 MG tablet Take 1 tablet (10 mg total) by mouth 2 (two) times daily.     carvedilol (COREG) 6.25 MG tablet Take 1 tablet (6.25 mg total) by mouth 2 (two) times daily with a meal. 60 tablet 1   clonazePAM (KLONOPIN) 0.5 MG disintegrating tablet Take 0.5 mg by mouth daily as needed for anxiety.     divalproex (DEPAKOTE ER) 500 MG 24 hr tablet Take  4 tablets (2,000 mg total) by mouth daily. 120 tablet 0   escitalopram (LEXAPRO) 10 MG tablet Take 1 tablet (10 mg total) by mouth at bedtime. 30 tablet 0   losartan (COZAAR) 100 MG tablet Take 1 tablet (100 mg total) by mouth daily. 30 tablet 1   NAYZILAM 5 MG/0.1ML SOLN Place 5 mg into the nose daily as needed. 2 each 2   paliperidone (INVEGA SUSTENNA) 156 MG/ML SUSY injection Inject 1 mL (156 mg total) into the muscle every 30 (thirty) days. 1 mL 6   Vitamin D, Ergocalciferol, (DRISDOL) 1.25 MG (50000 UNIT) CAPS capsule Take 50,000 Units by mouth every Monday.      Current Facility-Administered Medications  Medication Dose Route Frequency Provider Last Rate Last Admin   paliperidone (INVEGA SUSTENNA) injection 156 mg  156  mg Intramuscular Q30 days Zena Amos, MD   156 mg at 05/15/21 1606     Musculoskeletal: Strength & Muscle Tone: Unable to assess due to telemedicine visit Gait & Station: Unable to assess due to telemedicine visit Patient leans: Unable to assess due to telemedicine visit  Psychiatric Specialty Exam: Review of Systems  Psychiatric/Behavioral:  Negative for decreased concentration, dysphoric mood, hallucinations, self-injury, sleep disturbance and suicidal ideas. The patient is not nervous/anxious and is not hyperactive.    There were no vitals taken for this visit.There is no height or weight on file to calculate BMI.  General Appearance: Unable to assess due to telemedicine visit  Eye Contact:  Unable to assess due to telemedicine visit  Speech:  Clear and Coherent and Normal Rate  Volume:  Normal  Mood:  Euthymic  Affect:  Appropriate  Thought Process:  Coherent and Descriptions of Associations: Intact  Orientation:  Full (Time, Place, and Person)  Thought Content: WDL   Suicidal Thoughts:  No  Homicidal Thoughts:  No  Memory:  Immediate;   Good Recent;   Good Remote;   Fair  Judgement:  Fair  Insight:  Fair  Psychomotor Activity:  Normal   Concentration:  Concentration: Good and Attention Span: Good  Recall:  [employer] of Knowledge: Fair  Language: Good  Akathisia:  NA  Handed:  Right  AIMS (if indicated): not done  Assets:  Communication Skills Desire for Improvement Financial Resources/Insurance Housing Social Support Transportation  ADL's:  Intact  Cognition: Impaired,  Mild  Sleep:  Good   Screenings: GAD-7    Flowsheet Row Video Visit from 06/01/2021 in [facility] Office Visit from 04/13/2021 in [facility] Office Visit from 02/07/2021 in [facility] Office Visit from 12/13/2020 in [facility] Office Visit from 04/23/2019 in [facility]  Total GAD-7 Score 0 2 3 4 1       PHQ2-9    Flowsheet Row Video Visit from 06/01/2021 in [facility] Office Visit from 04/13/2021 in [facility] Office Visit from 02/07/2021 in [facility] Office Visit from 12/13/2020 in [facility] ED from 03/14/2020 in [facility]  PHQ-2 Total Score 0 0 2 1 2   PHQ-9 Total Score -- -- 2 -- 8      Flowsheet Row Video Visit from 06/01/2021 in [facility] Office Visit from 04/13/2021 in [facility] ED from 02/20/2021 in [facility]  C-SSRS RISK CATEGORY No Risk No Risk No Risk        Assessment and Plan:   Dylan Moon is a 24 year old male with a past psychiatric history significant for schizoaffective disorder (bipolar type) and unspecified intellectual disabilities who presents to [facility] for follow-up and medication management.  Patient reports no issues or concerns regarding his 30 injection.  Patient denies  depressive episodes at this time and states that he does not experience anxiety.  1. Schizoaffective disorder, bipolar type [facility]) Patient will be receiving his next paliperidone [place] [facility]) injection 156 mg on 06/14/2021  2. Unspecified intellectual disabilities  Patient to follow up in 2 months Provider spent a total of 15 minutes with the patient/reviewing patient's chart  Lorelei Pont, PA 06/01/2021, 7:46 PM

## 2021-06-08 ENCOUNTER — Ambulatory Visit (HOSPITAL_COMMUNITY): Payer: Self-pay

## 2021-06-14 ENCOUNTER — Encounter (HOSPITAL_COMMUNITY): Payer: Self-pay

## 2021-06-14 ENCOUNTER — Ambulatory Visit (INDEPENDENT_AMBULATORY_CARE_PROVIDER_SITE_OTHER): Payer: Medicaid Other | Admitting: *Deleted

## 2021-06-14 VITALS — BP 137/74 | HR 88 | Ht 71.0 in | Wt 218.0 lb

## 2021-06-14 DIAGNOSIS — F250 Schizoaffective disorder, bipolar type: Secondary | ICD-10-CM | POA: Diagnosis not present

## 2021-06-14 NOTE — Progress Notes (Signed)
In for his injection, he is late this month and late for the day, he was scheduled for 3 and got here at 415 pm. He states he is being monitored for seizure activity and has a monitor on his chest. Also, his dog bit his nose, has a small puncture on his L nostril. He is pleasant, good appearance and is verbal and appropriate. He asked for [place] to give him a rx for anti nausea medicine. Referred to the dr that is monitoring his seizures, also gave him a number and address for [facility] to establish a provider. He is to return in one month. Sees a provider in Nov and will consider at that time changing his medicine to the [place] injection and coming every three months.

## 2021-07-12 ENCOUNTER — Ambulatory Visit (HOSPITAL_COMMUNITY): Payer: Medicaid Other

## 2021-07-16 ENCOUNTER — Ambulatory Visit (HOSPITAL_COMMUNITY): Payer: Medicaid Other

## 2021-07-17 ENCOUNTER — Ambulatory Visit (HOSPITAL_COMMUNITY): Payer: Medicaid Other | Admitting: *Deleted

## 2021-07-17 ENCOUNTER — Encounter (HOSPITAL_COMMUNITY): Payer: Self-pay

## 2021-07-17 ENCOUNTER — Other Ambulatory Visit: Payer: Self-pay

## 2021-07-17 ENCOUNTER — Telehealth: Payer: Self-pay | Admitting: Nurse Practitioner

## 2021-07-17 VITALS — BP 145/84 | HR 77 | Ht 71.0 in | Wt 217.0 lb

## 2021-07-17 DIAGNOSIS — F250 Schizoaffective disorder, bipolar type: Secondary | ICD-10-CM

## 2021-07-17 MED ORDER — PALIPERIDONE PALMITATE ER 156 MG/ML IM SUSY
156.0000 mg | PREFILLED_SYRINGE | Freq: Once | INTRAMUSCULAR | Status: AC
  Administered 2021-07-17: 156 mg via INTRAMUSCULAR

## 2021-07-17 NOTE — Progress Notes (Signed)
Administered paliperidone (INVEGA SUSTENNA) 156 MG/ML SUSY injection --on the right deltoid and pt tolerated well. Pt is pleasant today.

## 2021-08-02 ENCOUNTER — Telehealth (HOSPITAL_COMMUNITY): Payer: Medicaid Other | Admitting: Physician Assistant

## 2021-08-13 ENCOUNTER — Ambulatory Visit: Payer: Self-pay | Admitting: Family Medicine

## 2021-08-14 ENCOUNTER — Ambulatory Visit (HOSPITAL_COMMUNITY): Payer: Medicaid Other

## 2021-08-22 ENCOUNTER — Ambulatory Visit (HOSPITAL_COMMUNITY): Payer: Medicaid Other | Admitting: *Deleted

## 2021-08-22 ENCOUNTER — Other Ambulatory Visit: Payer: Self-pay

## 2021-08-22 ENCOUNTER — Encounter (HOSPITAL_COMMUNITY): Payer: Self-pay

## 2021-08-22 VITALS — BP 134/62 | HR 90 | Ht 71.0 in | Wt 216.0 lb

## 2021-08-22 DIAGNOSIS — F250 Schizoaffective disorder, bipolar type: Secondary | ICD-10-CM

## 2021-08-22 MED ORDER — PALIPERIDONE PALMITATE ER 156 MG/ML IM SUSY
156.0000 mg | PREFILLED_SYRINGE | INTRAMUSCULAR | Status: AC
  Administered 2021-08-22 – 2021-11-13 (×3): 156 mg via INTRAMUSCULAR

## 2021-08-22 NOTE — Progress Notes (Signed)
In a week late for his monthly injection of Invega S 156mg  today he got his shot in his L DELTOID without difficulty. He smells of marijuana. He is pleasant and appropriate. Showed staff and his mom a magic trick. Mom asked when can he go to the three month shot and told when he improves on his compliance and gets here closer or on the day he is scheduled. Will discuss it with the provider the next time he is seen by one of the NP's. No complaints voiced, though he says as he frequently does that he doesn't think he needs the shot. He is to return in one month for his next shot.

## 2021-09-03 ENCOUNTER — Encounter (HOSPITAL_BASED_OUTPATIENT_CLINIC_OR_DEPARTMENT_OTHER): Payer: Self-pay

## 2021-09-03 ENCOUNTER — Emergency Department (HOSPITAL_BASED_OUTPATIENT_CLINIC_OR_DEPARTMENT_OTHER)
Admission: EM | Admit: 2021-09-03 | Discharge: 2021-09-03 | Disposition: A | Discharge status: Home / Self Care | Payer: Medicaid Other | Attending: Emergency Medicine | Admitting: Emergency Medicine

## 2021-09-03 DIAGNOSIS — F17290 Nicotine dependence, other tobacco product, uncomplicated: Secondary | ICD-10-CM | POA: Insufficient documentation

## 2021-09-03 DIAGNOSIS — R569 Unspecified convulsions: Secondary | ICD-10-CM | POA: Diagnosis present

## 2021-09-03 DIAGNOSIS — G40909 Epilepsy, unspecified, not intractable, without status epilepticus: Secondary | ICD-10-CM | POA: Insufficient documentation

## 2021-09-03 HISTORY — DX: Schizoaffective disorder, unspecified: F25.9

## 2021-09-03 HISTORY — DX: Bipolar disorder, unspecified: F31.9

## 2021-09-03 LAB — CBC WITH DIFFERENTIAL/PLATELET
Abs Immature Granulocytes: 0.04 10*3/uL (ref 0.00–0.07)
Basophils Absolute: 0 10*3/uL (ref 0.0–0.1)
Basophils Relative: 0 %
Eosinophils Absolute: 0 10*3/uL (ref 0.0–0.5)
Eosinophils Relative: 0 %
HCT: 48.5 % (ref 39.0–52.0)
Hemoglobin: 16.7 g/dL (ref 13.0–17.0)
Immature Granulocytes: 0 %
Lymphocytes Relative: 10 %
Lymphs Abs: 1.1 10*3/uL (ref 0.7–4.0)
MCH: 28.7 pg (ref 26.0–34.0)
MCHC: 34.4 g/dL (ref 30.0–36.0)
MCV: 83.5 fL (ref 80.0–100.0)
Monocytes Absolute: 0.3 10*3/uL (ref 0.1–1.0)
Monocytes Relative: 3 %
Neutro Abs: 9.4 10*3/uL — ABNORMAL HIGH (ref 1.7–7.7)
Neutrophils Relative %: 87 %
Platelets: 267 10*3/uL (ref 150–400)
RBC: 5.81 MIL/uL (ref 4.22–5.81)
RDW: 13.1 % (ref 11.5–15.5)
WBC: 10.8 10*3/uL — ABNORMAL HIGH (ref 4.0–10.5)
nRBC: 0 % (ref 0.0–0.2)

## 2021-09-03 LAB — COMPREHENSIVE METABOLIC PANEL
ALT: 24 U/L (ref 0–44)
AST: 31 U/L (ref 15–41)
Albumin: 5.3 g/dL — ABNORMAL HIGH (ref 3.5–5.0)
Alkaline Phosphatase: 49 U/L (ref 38–126)
Anion gap: 13 (ref 5–15)
BUN: 12 mg/dL (ref 6–20)
CO2: 21 mmol/L — ABNORMAL LOW (ref 22–32)
Calcium: 10.1 mg/dL (ref 8.9–10.3)
Chloride: 97 mmol/L — ABNORMAL LOW (ref 98–111)
Creatinine, Ser: 1.16 mg/dL (ref 0.61–1.24)
GFR, Estimated: >60 mL/min (ref >60)
Glucose, Bld: 186 mg/dL — ABNORMAL HIGH (ref 70–99)
Potassium: 7 mmol/L (ref 3.5–5.1)
Sodium: 131 mmol/L — ABNORMAL LOW (ref 135–145)
Total Bilirubin: 0.6 mg/dL (ref 0.3–1.2)
Total Protein: 8.9 g/dL — ABNORMAL HIGH (ref 6.5–8.1)

## 2021-09-03 LAB — VALPROIC ACID LEVEL: Valproic Acid Lvl: 41 ug/mL — ABNORMAL LOW (ref 50.0–100.0)

## 2021-09-03 LAB — CBG MONITORING, ED: Glucose-Capillary: 193 mg/dL — ABNORMAL HIGH (ref 70–99)

## 2021-09-03 LAB — MAGNESIUM: Magnesium: 2.5 mg/dL — ABNORMAL HIGH (ref 1.7–2.4)

## 2021-09-03 MED ORDER — SODIUM CHLORIDE 0.9 % IV SOLN: INTRAVENOUS | Status: DC

## 2021-09-03 MED ORDER — CALCIUM GLUCONATE 10 % IV SOLN
1.0000 g | Freq: Once | INTRAVENOUS | Status: AC
  Administered 2021-09-03: 1 g via INTRAVENOUS
  Filled 2021-09-03: qty 10

## 2021-09-03 MED ORDER — ONDANSETRON HCL 4 MG/2ML IJ SOLN
4.0000 mg | Freq: Once | INTRAMUSCULAR | Status: AC
  Administered 2021-09-03: 4 mg via INTRAVENOUS
  Filled 2021-09-03: qty 2

## 2021-09-03 MED ORDER — LORAZEPAM 2 MG/ML IJ SOLN
1.0000 mg | Freq: Once | INTRAMUSCULAR | Status: AC
  Administered 2021-09-03: 1 mg via INTRAVENOUS
  Filled 2021-09-03: qty 1

## 2021-09-03 MED ORDER — SODIUM CHLORIDE 0.9 % IV BOLUS
1000.0000 mL | Freq: Once | INTRAVENOUS | Status: AC
  Administered 2021-09-03: 1000 mL via INTRAVENOUS

## 2021-09-03 NOTE — ED Provider Notes (Signed)
[facility] EMERGENCY DEPARTMENT Provider Note   CSN: [id number] Arrival date & time: 09/03/21  1118     History Chief Complaint  Patient presents with   Seizures    Dylan Moon is a 25 y.o. male.  Patient with known seizure disorder.  Patient with a seizure shortly prior to arrival.  Lasting less than 5 minutes estimated to be around 2 minutes it was generalized tonic-clonic type seizure typical for him.  Patient felt fine yesterday.  This morning his several episodes of nausea and vomiting.  He therefore was not able to take his Depakote.  Normally he takes 4 tablets 2000 mg all at once.  Patient is not out of any of his antiseizure medicine.  He also has Klonopin and he also is taking Aptiom1600 mg daily.       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   Noncompliance with medication regimen 12/11/2017   Seizure ([place]) 05/24/2016   Dehydration, mild 05/24/2016   AKI (acute kidney injury) ([place]) 05/24/2016   Marijuana use 05/24/2016    Past Surgical History:  Procedure Laterality Date   NO PAST SURGERIES         Family History  Problem Relation Age of Onset   Diabetes Mother    Hypertension Mother    Cancer Other    Diabetes Father    Seizures Maternal Grandfather     Social History   Tobacco Use   Smoking status: Every Day    Types: Cigars    Smokeless tobacco: Never  Vaping Use   Vaping Use: Never used  Substance Use Topics   Alcohol use: No   Drug use: Yes    Types: Marijuana    Comment: Daily use of marijuana.    Home Medications Prior to Admission medications   Medication Sig Start Date End Date Taking? Authorizing Provider  acetaminophen (TYLENOL) 500 MG tablet Take 500 mg by mouth every 6 (six) hours as needed for mild pain or headache.    [provider]  ALPRAZolam Duanne Moron) 0.25 MG tablet Take 0.25 mg by mouth daily as needed for anxiety. 06/27/20   [provider]  APTIOM 800 MG TABS Take 1,600 mg by mouth daily. 07/20/20   Antonieta Pert, MD  busPIRone (BUSPAR) 10 MG tablet Take 1 tablet (10 mg total) by mouth 2 (two) times daily. 07/18/20   Antonieta Pert, MD  carvedilol (COREG) 6.25 MG tablet Take 1 tablet (6.25 mg total) by mouth 2 (two) times daily with a meal. 07/20/20 09/18/20  Antonieta Pert, MD  clonazePAM (KLONOPIN) 0.5 MG disintegrating tablet Take 0.5 mg by mouth daily as needed for anxiety. 12/12/20   [provider]  divalproex (DEPAKOTE ER) 500 MG 24 hr tablet Take 4 tablets (2,000 mg total) by mouth daily. 07/20/20 08/19/20  Antonieta Pert, MD  escitalopram (LEXAPRO) 10 MG tablet Take 1 tablet (10  mg total) by mouth at bedtime. 07/19/20   Starkes-Perry, Gayland Curry, FNP  losartan (COZAAR) 100 MG tablet Take 1 tablet (100 mg total) by mouth daily. 07/20/20 09/18/20  Antonieta Pert, MD  NAYZILAM 5 MG/0.1ML SOLN Place 5 mg into the nose daily as needed. 04/17/21   Wyvonnia Dusky, MD  paliperidone (INVEGA SUSTENNA) 156 MG/ML SUSY injection Inject 1 mL (156 mg total) into the muscle every 30 (thirty) days. 04/03/21   Nwoko, Terese Door, PA  Vitamin D, Ergocalciferol, (DRISDOL) 1.25 MG (50000 UNIT) CAPS capsule Take 50,000 Units by mouth every Monday.  01/24/20   [provider]    Allergies    Keppra [levetiracetam], Tramadol, and Vimpat [lacosamide]  Review of Systems   Review of Systems   Constitutional:  Negative for chills and fever.  HENT:  Negative for ear pain and sore throat.   Eyes:  Negative for pain and visual disturbance.  Respiratory:  Negative for cough and shortness of breath.   Cardiovascular:  Negative for chest pain and palpitations.  Gastrointestinal:  Positive for nausea and vomiting. Negative for abdominal pain.  Genitourinary:  Negative for dysuria and hematuria.  Musculoskeletal:  Negative for arthralgias and back pain.  Skin:  Negative for color change and rash.  Neurological:  Positive for seizures. Negative for syncope.  All other systems reviewed and are negative.  Physical Exam Updated Vital Signs BP (!) 143/82   Pulse 89   Temp 98.7 F (37.1 C)   Resp 18   SpO2 97%   Physical Exam Vitals and nursing note reviewed.  Constitutional:      General: He is not in acute distress.    Appearance: Normal appearance. He is well-developed.  HENT:     Head: Normocephalic and atraumatic.  Eyes:     Extraocular Movements: Extraocular movements intact.     Conjunctiva/sclera: Conjunctivae normal.     Pupils: Pupils are equal, round, and reactive to light.  Cardiovascular:     Rate and Rhythm: Normal rate and regular rhythm.     Heart sounds: No murmur heard. Pulmonary:     Effort: Pulmonary effort is normal. No respiratory distress.     Breath sounds: Normal breath sounds.  Abdominal:     Palpations: Abdomen is soft.     Tenderness: There is no abdominal tenderness.  Musculoskeletal:        General: No swelling.     Cervical back: Neck supple.  Skin:    General: Skin is warm and dry.     Capillary Refill: Capillary refill takes less than 2 seconds.  Neurological:     General: No focal deficit present.     Mental Status: He is alert and oriented to person, place, and time.     Cranial Nerves: No cranial nerve deficit.     Sensory: No sensory deficit.  Psychiatric:        Mood and Affect: Mood normal.    ED Results / Procedures /  Treatments   Labs (all labs ordered are listed, but only abnormal results are displayed) Labs Reviewed  CBC WITH DIFFERENTIAL/PLATELET - Abnormal; Notable for the following components:      Result Value   WBC 10.8 (*)    Neutro Abs 9.4 (*)    All other components within normal limits  COMPREHENSIVE METABOLIC PANEL - Abnormal; Notable for the following components:   Sodium 131 (*)    Potassium 7.0 (*)    Chloride 97 (*)    CO2  21 (*)    Glucose, Bld 186 (*)    Total Protein 8.9 (*)    Albumin 5.3 (*)    All other components within normal limits  MAGNESIUM - Abnormal; Notable for the following components:   Magnesium 2.5 (*)    All other components within normal limits  CBG MONITORING, ED - Abnormal; Notable for the following components:   Glucose-Capillary 193 (*)    All other components within normal limits  POTASSIUM  VALPROIC ACID LEVEL    EKG EKG Interpretation  Date/Time:  Monday September 03 2021 11:27:10 EST Ventricular Rate:  114 PR Interval:  158 QRS Duration: 78 QT Interval:  312 QTC Calculation: 430 R Axis:   66 Text Interpretation: Sinus tachycardia Right atrial enlargement Borderline repolarization abnormality Confirmed by Fredia Sorrow [phone]) on 09/03/2021 12:22:20 PM  Radiology No results found.  Procedures Procedures   Medications Ordered in ED Medications  ondansetron (ZOFRAN) injection 4 mg (has no administration in time range)  0.9 %  sodium chloride infusion (has no administration in time range)  sodium chloride 0.9 % bolus 1,000 mL (has no administration in time range)  LORazepam (ATIVAN) injection 1 mg (has no administration in time range)  calcium gluconate inj 10% (1 g) URGENT USE ONLY! (has no administration in time range)    ED Course  I have reviewed the triage vital signs and the nursing notes.  Pertinent labs & imaging results that were available during my care of the patient were reviewed by me and considered in my medical  decision making (see chart for details).    MDM Rules/Calculators/A&P                         CRITICAL CARE Performed by: Fredia Sorrow Total critical care time: 40 minutes Critical care time was exclusive of separately billable procedures and treating other patients. Critical care was necessary to treat or prevent imminent or life-threatening deterioration. Critical care was time spent personally by me on the following activities: development of treatment plan with patient and/or surrogate as well as nursing, discussions with consultants, evaluation of patient's response to treatment, examination of patient, obtaining history from patient or surrogate, ordering and performing treatments and interventions, ordering and review of laboratory studies, ordering and review of radiographic studies, pulse oximetry and re-evaluation of patient's condition.   Patient was somewhat surprising finding of a potassium of 7.0.  Will repeat in case his hemolysis.  But just in case we will give calcium gluconate to protect the heart.  Patient's renal functions fine.  I guess it is possible could have bumped up from the seizure activity.  But the seizure lasted just a couple minutes.  It was not prolonged.  Patient with nausea vomiting today so did not get his Depakote in.  We will give a little bit of IV Ativan some Zofran we will hydrate him.  And then if necessary check the potassium also after hydration.  Would expect EKG changes and we do not really have any significant ones.  If the potassium is in the 7 range certainly no EKG changes.  Plan will be to give some oral Depakote once he is feeling a little bit better.  Patient is followed by neurology at [facility].  Patient did not want to take the Depakote here.  Says he will take it at home.  His potassium came back up to normal on repeat at 5.  This was without any significant intervention  because calcium gluconate will not change the potassium.  Valproic  acid level was low.  Patient did say he will follow-up with his neurologist.  From the beginning patient did not really want to be here or I told him before that he could leave is not a prisoner.  Got towards the end patient did agree to stay but then he got very aggressive at the end and was difficult to reason with so he left without any of his paperwork.  Final Clinical Impression(s) / ED Diagnoses Final diagnoses:  Seizure [facility])    Rx / DC Orders ED Discharge Orders     None        Vanetta Mulders, MD 09/03/21 1500

## 2021-09-03 NOTE — Discharge Instructions (Addendum)
Work-up here for the seizures without any acute findings.  Take your antinausea medicine at home.  Recommended taking Depakote as directed.  Since she having any today take it when you get home.  We were willing to give you some here orally but she would want to go home now.  Follow-up with neurology in the next few days.  Return for any new or worse symptoms.  Initially your potassium was high.  But repeated that showed that it was normal.

## 2021-09-03 NOTE — ED Triage Notes (Signed)
Brought in by EMS for seizure lasting 2 mins. Meds being adjusted per family. Incontinent of urine

## 2021-09-03 NOTE — ED Notes (Signed)
Pt had been informed that MD was getting paper work ready. Pt pulled iv and left before getting dc instructions,  Rn saw him leaving and went to entrance of building to give him paper work but could not find him

## 2021-09-03 NOTE — ED Notes (Signed)
Pt. Pulled his IV out and is ready to go .  Unable to deal with the Pt. At this time and havnt seen the Pt. Due to busy with other Pt.s at present time.

## 2021-09-03 NOTE — ED Notes (Signed)
RN in another room and when Pt. Decided he was going to leave.

## 2021-09-04 LAB — POTASSIUM: Potassium: 5 mmol/L (ref 3.5–5.1)

## 2021-09-13 ENCOUNTER — Encounter (HOSPITAL_COMMUNITY): Payer: Self-pay | Admitting: *Deleted

## 2021-09-13 ENCOUNTER — Other Ambulatory Visit: Payer: Self-pay

## 2021-09-13 ENCOUNTER — Emergency Department (HOSPITAL_COMMUNITY)
Admission: EM | Admit: 2021-09-13 | Discharge: 2021-09-13 | Disposition: A | Discharge status: Home / Self Care | Payer: Medicaid Other | Attending: Emergency Medicine | Admitting: Emergency Medicine

## 2021-09-13 DIAGNOSIS — R569 Unspecified convulsions: Secondary | ICD-10-CM | POA: Insufficient documentation

## 2021-09-13 DIAGNOSIS — R251 Tremor, unspecified: Secondary | ICD-10-CM | POA: Insufficient documentation

## 2021-09-13 DIAGNOSIS — Z79899 Other long term (current) drug therapy: Secondary | ICD-10-CM | POA: Diagnosis not present

## 2021-09-13 DIAGNOSIS — F17290 Nicotine dependence, other tobacco product, uncomplicated: Secondary | ICD-10-CM | POA: Diagnosis not present

## 2021-09-13 DIAGNOSIS — E875 Hyperkalemia: Secondary | ICD-10-CM | POA: Insufficient documentation

## 2021-09-13 LAB — CBC WITH DIFFERENTIAL/PLATELET
Abs Immature Granulocytes: 0.07 10*3/uL (ref 0.00–0.07)
Basophils Absolute: 0.1 10*3/uL (ref 0.0–0.1)
Basophils Relative: 1 %
Eosinophils Absolute: 0 10*3/uL (ref 0.0–0.5)
Eosinophils Relative: 0 %
HCT: 47.8 % (ref 39.0–52.0)
Hemoglobin: 15.9 g/dL (ref 13.0–17.0)
Immature Granulocytes: 1 %
Lymphocytes Relative: 31 %
Lymphs Abs: 3.1 10*3/uL (ref 0.7–4.0)
MCH: 29.3 pg (ref 26.0–34.0)
MCHC: 33.3 g/dL (ref 30.0–36.0)
MCV: 88.2 fL (ref 80.0–100.0)
Monocytes Absolute: 0.4 10*3/uL (ref 0.1–1.0)
Monocytes Relative: 4 %
Neutro Abs: 6.2 10*3/uL (ref 1.7–7.7)
Neutrophils Relative %: 63 %
Platelets: 231 10*3/uL (ref 150–400)
RBC: 5.42 MIL/uL (ref 4.22–5.81)
RDW: 13.2 % (ref 11.5–15.5)
WBC: 9.9 10*3/uL (ref 4.0–10.5)
nRBC: 0 % (ref 0.0–0.2)

## 2021-09-13 LAB — BASIC METABOLIC PANEL
Anion gap: 17 — ABNORMAL HIGH (ref 5–15)
BUN: 10 mg/dL (ref 6–20)
CO2: 20 mmol/L — ABNORMAL LOW (ref 22–32)
Calcium: 9.7 mg/dL (ref 8.9–10.3)
Chloride: 99 mmol/L (ref 98–111)
Creatinine, Ser: 1.26 mg/dL — ABNORMAL HIGH (ref 0.61–1.24)
GFR, Estimated: >60 mL/min (ref >60)
Glucose, Bld: 202 mg/dL — ABNORMAL HIGH (ref 70–99)
Potassium: 5.5 mmol/L — ABNORMAL HIGH (ref 3.5–5.1)
Sodium: 136 mmol/L (ref 135–145)

## 2021-09-13 NOTE — ED Provider Notes (Signed)
[facility] EMERGENCY DEPARTMENT Provider Note   CSN: [id number] Arrival date & time: 09/13/21  6433     History Chief Complaint  Patient presents with   Seizures    Dylan Moon is a 25 y.o. male.  HPI  25 year old male with past medical history of bipolar disorder, schizoaffective disorder, epilepsy on multiple antiepileptics presents the emergency department with reported seizure.  Patient is accompanied by his girlfriend.  Patient is a poor historian due to cooperation.  States he does not want to be here and just wants to leave immediately.  History obtained per girlfriend who states that when he went to use the restroom today he started having some shaking and she thought that he was out of it so she gave him some abortive medication.  Her and the patient admit that the patient has been noncompliant with his medications due to the "pills being too big".  He has been transition to a liquid form however he is noncompliant with that as well.  Last breakthrough seizure was about 10 days ago, prior to that was about 6 months ago.  Patient did not fall or hit his head, the girlfriend was able to lay him on the ground.  The patient has no acute complaints right now.  Denies any recent illness.  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   Noncompliance with medication regimen  12/11/2017   Seizure (HCC) 05/24/2016   Dehydration, mild 05/24/2016   AKI (acute kidney injury) (HCC) 05/24/2016   Marijuana use 05/24/2016    Past Surgical History:  Procedure Laterality Date   NO PAST SURGERIES         Family History  Problem Relation Age of Onset   Diabetes Mother    Hypertension Mother    Cancer Other    Diabetes Father    Seizures Maternal Grandfather     Social History   Tobacco Use   Smoking status: Every Day    Types: Cigars   Smokeless tobacco: Never  Vaping Use   Vaping Use: Never used  Substance Use Topics   Alcohol use: No   Drug use: Yes    Types: Marijuana    Comment: Daily use of marijuana.    Home Medications Prior to Admission medications   Medication Sig Start Date End Date Taking? Authorizing Provider  carvedilol (COREG) 6.25 MG tablet Take 1 tablet (6.25 mg total) by mouth 2 (two) times daily with a meal. 07/20/20 09/13/21 Yes Kc, Dayna Barker, MD  divalproex (DEPAKOTE ER) 500 MG 24 hr tablet Take 4 tablets (2,000 mg total) by mouth daily. 07/20/20 09/13/21 Yes Lanae Boast, MD  losartan (COZAAR) 100 MG tablet Take 1 tablet (100 mg total) by mouth daily. 07/20/20 09/13/21 Yes Lanae Boast, MD  acetaminophen (TYLENOL) 500 MG tablet Take 500 mg by mouth every 6 (six) hours as needed for mild pain or  headache.    [provider]  ALPRAZolam Prudy Feeler) 0.25 MG tablet Take 0.25 mg by mouth daily as needed for anxiety. 06/27/20   [provider]  APTIOM 800 MG TABS Take 1,600 mg by mouth daily. 07/20/20   Lanae Boast, MD  busPIRone (BUSPAR) 10 MG tablet Take 1 tablet (10 mg total) by mouth 2 (two) times daily. 07/18/20   Lanae Boast, MD  clonazePAM (KLONOPIN) 0.5 MG disintegrating tablet Take 0.5 mg by mouth daily as needed for anxiety. 12/12/20   [provider]  escitalopram (LEXAPRO) 10 MG tablet Take 1 tablet (10 mg total) by mouth at bedtime. 07/19/20   Starkes-Perry, Juel Burrow, FNP  famotidine (PEPCID) 20 MG tablet Take  20 mg by mouth every morning. 07/17/21   [provider]  lactulose (CHRONULAC) 10 GM/15ML solution Take 15 mLs by mouth at bedtime. 05/11/21   [provider]  NAYZILAM 5 MG/0.1ML SOLN Place 5 mg into the nose daily as needed. 04/17/21   Terald Sleeper, MD  paliperidone (INVEGA SUSTENNA) 156 MG/ML SUSY injection Inject 1 mL (156 mg total) into the muscle every 30 (thirty) days. 04/03/21   Nwoko, Tommas Olp, PA  promethazine (PHENERGAN) 6.25 MG/5ML syrup Take 12.5 mg by mouth every 6 (six) hours as needed for nausea/vomiting. 09/10/21   [provider]  valproic acid (DEPAKENE) 250 MG/5ML solution Take 20 mLs by mouth in the morning and at bedtime. 08/20/21   [provider]  Vitamin D, Ergocalciferol, (DRISDOL) 1.25 MG (50000 UNIT) CAPS capsule Take 50,000 Units by mouth every Monday.  01/24/20   [provider]    Allergies    Keppra [levetiracetam], Tramadol, and Vimpat [lacosamide]  Review of Systems   Review of Systems  Reason unable to perform ROS: Limited secondary to cooperation.  Constitutional:  Negative for fever.  Eyes:  Negative for visual disturbance.  Respiratory:  Negative for shortness of breath.   Cardiovascular:  Negative for chest pain.  Gastrointestinal:  Negative for abdominal pain.  Musculoskeletal:  Negative for back pain and neck pain.  Skin:  Negative for rash.  Neurological:  Positive for seizures. Negative for headaches.   Physical Exam Updated Vital Signs BP (!) 147/84 (BP Location: Right Arm)    Pulse (!) 110    Temp 97.6 F (36.4 C) (Axillary)    Resp (!) 26    Ht 5\' 11"  (1.803 m)    Wt 98 kg    SpO2 95%    BMI 30.13 kg/m   Physical Exam Vitals and nursing note reviewed.  Constitutional:      General: He is not in acute distress.    Appearance: Normal appearance.  HENT:     Head: Normocephalic.     Mouth/Throat:     Mouth: Mucous membranes are moist.  Cardiovascular:     Comments: Refuses auscultation   Pulmonary:     Effort: Pulmonary effort is normal. No respiratory distress.  Skin:    General: Skin is warm.  Neurological:     General: No focal deficit present.     Mental Status: He is alert and oriented to person, place, and time. Mental status is at baseline.     Coordination: Coordination normal.  Psychiatric:        Mood and Affect: Mood normal.    ED Results / Procedures / Treatments   Labs (all labs ordered are listed, but only abnormal results are displayed) Labs Reviewed  BASIC METABOLIC PANEL - Abnormal; Notable  for the following components:      Result Value   Potassium 5.5 (*)    CO2 20 (*)    Glucose, Bld 202 (*)    Creatinine, Ser 1.26 (*)    Anion gap 17 (*)    All other components within normal limits  CBC WITH DIFFERENTIAL/PLATELET    EKG EKG Interpretation  Date/Time:  Thursday September 13 2021 09:28:40 EST Ventricular Rate:  104 PR Interval:  137 QRS Duration: 80 QT Interval:  299 QTC Calculation: 394 R Axis:   78 Text Interpretation: Sinus tachycardia Repol abnrm suggests ischemia, diffuse leads NSR, T wave inversions that are more pronounced than previous Confirmed by Coralee Pesa [id number]) on 09/13/2021 11:23:04 AM  Radiology No results found.  Procedures Procedures   Medications Ordered in ED Medications - No data to display  ED Course  I have reviewed the triage vital signs and the nursing notes.  Pertinent labs & imaging results that were available during my care of the patient were reviewed by me and considered in my medical decision making (see chart for details).    MDM Rules/Calculators/A&P                           25 year old male presents emergency department with seizure activity.  History of epilepsy and noncompliance with his antiepileptics.  Tachycardic on arrival, sitting up at the side of the bed, requesting to leave.  Girlfriend at bedside, primary history giver secondary to patient's cooperation.  Blood work shows  a mild hyperkalemia 5.5.  EKG is sinus rhythm with more pronounced T wave inversions but no other acute changes.  Patient has no complaints for me.  I have expressed the importance of staying for full evaluation, hyperkalemia correction.  I explained the risks of leaving AGAINST MEDICAL ADVICE including deterioration, continued seizure activity, permanent disability and death.  The girlfriend has been unable to convince the patient to stay for further treatment/evaluation. He refuses PE and further testing.  I have recommended that the patient stays in the department for the completion of their workup however they decline.  I have expressed the importance of staying including the risks of leaving which include worsening condition, permanent disability and death.  Patient accepts these risks.  They are alert and oriented, they have capacity to make this decision.  I have not been able to convince the patient to stay and they understand the risks of leaving AGAINST MEDICAL ADVICE.       Final Clinical Impression(s) / ED Diagnoses Final diagnoses:  Seizure [facility])    Rx / DC Orders ED Discharge Orders     None        Rozelle Logan, DO 09/13/21 1131

## 2021-09-13 NOTE — ED Triage Notes (Signed)
Patient presents to [facility], girlfriend states he was sitting on the commode and started shaking states she scanned his vagal stimulator and gave him 5 mg nasal spray upon arrival ems gave 5mg  versed IV upon arrival patient isnon-verbal at present postical at this time. Girlfriend at bedside

## 2021-09-13 NOTE — ED Notes (Signed)
Patient sitting up on the side of the stretcher,,pulled all monitor leads off. Dylan Moon is ready to go home , girlfriend remains at bedside.

## 2021-09-13 NOTE — Discharge Instructions (Signed)
You have chosen to leave the emergency department AGAINST MEDICAL ADVICE.  I have explained to you your testing results and the need for further evaluation/admission.  You have verbalized understanding of these results and plan and are choosing to leave the hospital AGAINST MEDICAL ADVICE. You understand the risks associated with leaving without further evaluation/treatment. If you have any worsening symptoms or choose to be treated please return immediately to emergency department. 

## 2021-09-21 ENCOUNTER — Other Ambulatory Visit: Payer: Self-pay

## 2021-09-21 ENCOUNTER — Ambulatory Visit (HOSPITAL_COMMUNITY): Payer: Medicaid Other | Admitting: *Deleted

## 2021-09-21 ENCOUNTER — Encounter (HOSPITAL_COMMUNITY): Payer: Self-pay

## 2021-09-21 DIAGNOSIS — F250 Schizoaffective disorder, bipolar type: Secondary | ICD-10-CM

## 2021-09-21 NOTE — Progress Notes (Signed)
Walk in unscheduled with his mom for his Invega Su 156 mg injection. He had come earlier this week with mom but too late to get his shot for that day. Discussed with mom tues and thurs are shot days and if 4 is the best she can do I will work with her but it has to be no later than 4 and we have to have him seen by a provider every 3 months and those appt wont be at 4 because the provider is not here at that time. He is negative, doesn't look at Clinical research associate, states he doesn't want to talk anymore and this is the last time he is coming for his shot. He lives with his girlfriend and their two kids and mom works in [place] and cant get here before now. Patient is baseline. He continues to have no insight or motivation for self and his mental wellness. He got his shot in his R DELTOID. He is to return in one month.

## 2021-10-10 ENCOUNTER — Telehealth (HOSPITAL_COMMUNITY): Payer: Self-pay | Admitting: *Deleted

## 2021-10-10 NOTE — Telephone Encounter (Signed)
Faxed to [facility] at [facility] referral on patient as I discussed with his mom. She would like more services for him and this Clinical research associate unsure he will agree to more as he is resistant to coming for his monthly shot and shows limited insight to illness and treatment

## 2021-10-17 ENCOUNTER — Ambulatory Visit: Payer: Medicaid Other | Admitting: Podiatry

## 2021-10-23 ENCOUNTER — Ambulatory Visit (HOSPITAL_COMMUNITY): Payer: Medicaid Other

## 2021-11-09 ENCOUNTER — Telehealth (HOSPITAL_COMMUNITY): Payer: Self-pay | Admitting: *Deleted

## 2021-11-09 NOTE — Telephone Encounter (Signed)
Call from mom, states he is acting up and wanted to bring him in today. Clinic days are tues am and thrus pm and there is not a reason to make an exception for him today as he needs to see a provider first, not seen by [facility] since Sept 9th. Also, historically they come whenever they want and its in the late day after 4 which is not going to work for today. He is to come tues between 9a and 1 p which are tues clinic hours. Mom agreed and said she would be here.

## 2021-11-13 ENCOUNTER — Ambulatory Visit (INDEPENDENT_AMBULATORY_CARE_PROVIDER_SITE_OTHER): Payer: Medicaid Other | Admitting: *Deleted

## 2021-11-13 ENCOUNTER — Encounter (HOSPITAL_COMMUNITY): Payer: Self-pay

## 2021-11-13 ENCOUNTER — Other Ambulatory Visit: Payer: Self-pay

## 2021-11-13 ENCOUNTER — Ambulatory Visit (INDEPENDENT_AMBULATORY_CARE_PROVIDER_SITE_OTHER): Payer: Medicaid Other | Admitting: Family

## 2021-11-13 ENCOUNTER — Encounter (HOSPITAL_COMMUNITY): Payer: Self-pay | Admitting: Family

## 2021-11-13 VITALS — BP 136/88 | HR 84 | Ht 71.0 in | Wt 219.0 lb

## 2021-11-13 DIAGNOSIS — F250 Schizoaffective disorder, bipolar type: Secondary | ICD-10-CM | POA: Diagnosis not present

## 2021-11-13 MED ORDER — PALIPERIDONE PALMITATE ER 546 MG/1.75ML IM SUSY
546.0000 mg | PREFILLED_SYRINGE | INTRAMUSCULAR | Status: AC
  Administered 2021-12-11: 561.6 mg via INTRAMUSCULAR

## 2021-11-13 MED ORDER — INVEGA TRINZA 546 MG/1.75ML IM SUSY: 561.0000 mg | PREFILLED_SYRINGE | INTRAMUSCULAR | 3 refills | Status: DC

## 2021-11-13 NOTE — Progress Notes (Signed)
BH MD/PA/NP OP Progress Note  11/13/2021 11:10 AM Dylan Moon  MRN:  [id number]  Chief Complaint: No chief complaint on file.  HPI: Patient presents to [facility] behavioral health outpatient for scheduled appointment and long-acting injectable Dylan Moon IM administration. Patient is accompanied by his mother, who remains in lobby.  Patient is assessed face-to-face by nurse practitioner.  He is seated in assessment area, no acute distress.  He is alert and oriented, pleasant and cooperative during assessment. Patient presents with mild impatience. He shares that he has difficulty finding the time, in his schedule, to attend appointments. He requests that Dylan Moon monthly be updated to Dylan Moon every three months  in an effort to visit the office less frequently if possible.   Dylan Moon has been diagnosed with bipolar 1 disorder and schizoaffective disorder, bipolar type. He has been stable on Dylan Moon IM 156 mg monthly.    Patient is not linked with outpatient therapy, currently denies any interest in outpatient talk therapy.  This Clinical research associate conducted PHQ-2 today, patient scored 1 today. At last visit on 06/01/21, patient scored 0 on PHQ-2.   He presents with euthymic mood, congruent affect. He denies suicidal and homicidal ideations.  He has normal speech and behavior.  He denies both auditory and visual hallucinations.  Patient is able to converse coherently with goal-directed thoughts and no distractibility or preoccupation.  He denies paranoia.  Objectively there is no evidence of psychosis/mania or delusional thinking.  Patient resides in [place] with his girlfriend and a male friend,. He denies access to weapons. Patient endorses average sleep and appetite. He denies alcohol and drug use.   Patient offered support and encouragement.   Visit Diagnosis:    ICD-10-CM   1. Schizoaffective disorder, bipolar type (HCC)  F25.0       Past Psychiatric History:  ADHD, bipolar 1 disorder, schizoaffective disorder,   Past Medical History:  Past Medical History:  Diagnosis Date   ADHD    Bipolar 1 disorder (HCC)    Schizoaffective disorder (HCC)    Scoliosis    Seizures (HCC)    most recent 12/02/17    Past Surgical History:  Procedure Laterality Date   NO PAST SURGERIES      Family Psychiatric History: none reported  Family History:  Family History  Problem Relation Age of Onset   Diabetes Mother    Hypertension Mother    Cancer Other    Diabetes Father    Seizures Maternal Grandfather     Social History:  Social History   Socioeconomic History   Marital status: Single    Spouse name: Not on file   Number of children: 0   Years of education: HS   Highest education level: Not on file  Occupational History   Occupation: Product manager of videos  Tobacco Use   Smoking status: Every Day    Types: Cigars   Smokeless tobacco: Never  Vaping Use   Vaping Use: Never used  Substance and Sexual Activity   Alcohol use: No   Drug use: Yes    Types: Marijuana    Comment: Daily use of marijuana.   Sexual activity: Not Currently  Other Topics Concern   Not on file  Social History Narrative   Lives at home his mother.   3-4 sodas per week.   Right-handed.   Social Determinants of Health   Financial Resource Strain: Not on file  Food Insecurity: Not on file  Transportation Needs: Not on file  Physical Activity: Not on file  Stress: Not on file  Social Connections: Not on file    Allergies:  Allergies  Allergen Reactions   Keppra [Levetiracetam] Other (See Comments)    Irritability    Tramadol Other (See Comments)    Contraindicated with current medications (??)   Vimpat [Lacosamide] Other (See Comments)    Causes anger    Metabolic Disorder Labs: Lab Results  Component Value Date   HGBA1C 5.8 (H) 12/24/2019   MPG 119.76 12/24/2019   No results found for: PROLACTIN Lab Results  Component Value Date    CHOL 213 (H) 02/06/2021   TRIG 143 02/06/2021   HDL 44 02/06/2021   CHOLHDL 4.8 02/06/2021   VLDL 32 07/20/2020   LDLCALC 142 (H) 02/06/2021   LDLCALC 140 (H) 07/20/2020   Lab Results  Component Value Date   TSH 2.22 02/06/2021   TSH 2.226 07/10/2020    Therapeutic Level Labs: No results found for: LITHIUM Lab Results  Component Value Date   VALPROATE 41 (L) 09/03/2021   VALPROATE 49 (L) 04/17/2021   No components found for:  CBMZ  Current Medications: Current Outpatient Medications  Medication Sig Dispense Refill   APTIOM 800 MG TABS Take 1,600 mg by mouth daily. 30 tablet 0   paliperidone (INVEGA SUSTENNA) 156 MG/ML SUSY injection Inject 1 mL (156 mg total) into the muscle every 30 (thirty) days. 1 mL 6   promethazine (PHENERGAN) 6.25 MG/5ML syrup Take 12.5 mg by mouth every 6 (six) hours as needed for nausea/vomiting.     valproic acid (DEPAKENE) 250 MG/5ML solution Take 20 mLs by mouth in the morning and at bedtime.     Vitamin D, Ergocalciferol, (DRISDOL) 1.25 MG (50000 UNIT) CAPS capsule Take 50,000 Units by mouth every Monday.      acetaminophen (TYLENOL) 500 MG tablet Take 500 mg by mouth every 6 (six) hours as needed for mild pain or headache. (Patient not taking: Reported on 11/13/2021)     ALPRAZolam (XANAX) 0.25 MG tablet Take 0.25 mg by mouth daily as needed for anxiety. (Patient not taking: Reported on 11/13/2021)     busPIRone (BUSPAR) 10 MG tablet Take 1 tablet (10 mg total) by mouth 2 (two) times daily. (Patient not taking: Reported on 11/13/2021)     carvedilol (COREG) 6.25 MG tablet Take 1 tablet (6.25 mg total) by mouth 2 (two) times daily with a meal. 60 tablet 1   clonazePAM (KLONOPIN) 0.5 MG disintegrating tablet Take 0.5 mg by mouth daily as needed for anxiety. (Patient not taking: Reported on 11/13/2021)     divalproex (DEPAKOTE ER) 500 MG 24 hr tablet Take 4 tablets (2,000 mg total) by mouth daily. 120 tablet 0   escitalopram (LEXAPRO) 10 MG tablet Take 1  tablet (10 mg total) by mouth at bedtime. (Patient not taking: Reported on 11/13/2021) 30 tablet 0   famotidine (PEPCID) 20 MG tablet Take 20 mg by mouth every morning. (Patient not taking: Reported on 11/13/2021)     lactulose (CHRONULAC) 10 GM/15ML solution Take 15 mLs by mouth at bedtime. (Patient not taking: Reported on 11/13/2021)     losartan (COZAAR) 100 MG tablet Take 1 tablet (100 mg total) by mouth daily. 30 tablet 1   NAYZILAM 5 MG/0.1ML SOLN Place 5 mg into the nose daily as needed. (Patient not taking: Reported on 11/13/2021) 2 each 2   No current facility-administered medications for this visit.     Musculoskeletal: Strength & Muscle Tone: within normal limits  Gait & Station: normal Patient leans: N/A  Psychiatric Specialty Exam: Review of Systems  Constitutional: Negative.   HENT: Negative.    Eyes: Negative.   Respiratory: Negative.    Cardiovascular: Negative.   Gastrointestinal: Negative.   Genitourinary: Negative.   Musculoskeletal: Negative.   Skin: Negative.   Neurological: Negative.   Hematological: Negative.   Psychiatric/Behavioral: Negative.     There were no vitals taken for this visit.There is no height or weight on file to calculate BMI.  General Appearance: Casual and Fairly Groomed  Eye Contact:  Fair  Speech:  Clear and Coherent and Normal Rate  Volume:  Normal  Mood:  Euthymic  Affect:  Appropriate and Congruent  Thought Process:  Coherent, Goal Directed, and Linear  Orientation:  Full (Time, Place, and Person)  Thought Content: WDL and Logical   Suicidal Thoughts:  No  Homicidal Thoughts:  No  Memory:  Immediate;   Good Recent;   Good Remote;   Good  Judgement:  Fair  Insight:  Present  Psychomotor Activity:  Normal  Concentration:  Concentration: Good and Attention Span: Good  Recall:  Good  Fund of Knowledge: Good  Language: Good  Akathisia:  No  Handed:  Right  AIMS (if indicated): done  Assets:  Communication Skills Desire for  Improvement Financial Resources/Insurance Housing Intimacy Leisure Time Physical Health Resilience Social Support  ADL's:  Intact  Cognition: WNL  Sleep:  Good   Screenings: GAD-7    Flowsheet Row Video Visit from 06/01/2021 in [facility] Office Visit from 04/13/2021 in [facility] Office Visit from 02/07/2021 in [facility] Office Visit from 12/13/2020 in [facility] Office Visit from 04/23/2019 in [facility]  Total GAD-7 Score 0 2 3 4 1       PHQ2-9    Flowsheet Row Office Visit from 11/13/2021 in [facility] Video Visit from 06/01/2021 in [facility] Office Visit from 04/13/2021 in [facility] Office Visit from 02/07/2021 in [facility] Office Visit from 12/13/2020 in [facility]  PHQ-2 Total Score 1 0 0 2 1  PHQ-9 Total Score -- -- -- 2 --      Flowsheet Row Office Visit from 11/13/2021 in [facility] ED from 09/13/2021 in [facility] EMERGENCY DEPARTMENT ED from 09/03/2021 in [facility] EMERGENCY DEPARTMENT  C-SSRS RISK CATEGORY No Risk No Risk No Risk        Assessment and Plan: Patient reviewed with Dr 14/08/2021. Invega Sustenna 156 mg IM monthly discontinued per patient request. Nelly Rout 546mg  IM Q3 months.Follow in in approx. 3 months for Dylan Moon IM administration.  Collaboration of Care: Collaboration of Care:   Patient/Guardian was advised Release of Information must be obtained prior to any record release in order to collaborate their care with an outside provider. Patient/Guardian was advised if they have not already done so to contact the registration department to sign all necessary forms in order for to release  information regarding their care.   Consent: Patient/Guardian gives verbal consent for treatment and assignment of benefits for services provided during this visit. Patient/Guardian expressed understanding and agreed to proceed.    Dylan Mechanic, FNP 11/13/2021, 11:10 AM

## 2021-11-13 NOTE — Progress Notes (Signed)
In with mom to get his monthly injection of Invega S 156 mg in his L DELTOID today without issue. He spoke first with the provider and they discussed changing him to an every 3 months shot in part because mom has to struggle to get him here, he demonstrates little insight and mom works and has some difficulty getting him to his appt. He is open to the change as he doesn't like to come and doesn't  think he needs to. Will start his three month shot at his next visit, order called to his pharmacy to pick up.

## 2021-12-04 ENCOUNTER — Telehealth (HOSPITAL_COMMUNITY): Payer: Self-pay | Admitting: *Deleted

## 2021-12-04 NOTE — Telephone Encounter (Signed)
Mom had requested a letter for him for court, I told her I would leave a letter at the front desk for her to pick up on tues. She requested the letter say he came her for a shot and his appt. Letter done and at the front desk for her.  ?

## 2021-12-11 ENCOUNTER — Ambulatory Visit (HOSPITAL_COMMUNITY): Payer: Medicaid Other

## 2021-12-11 ENCOUNTER — Encounter (HOSPITAL_COMMUNITY): Payer: Self-pay

## 2021-12-11 ENCOUNTER — Ambulatory Visit (INDEPENDENT_AMBULATORY_CARE_PROVIDER_SITE_OTHER): Payer: Medicaid Other | Admitting: *Deleted

## 2021-12-11 VITALS — BP 135/78 | HR 87 | Ht 71.0 in | Wt 217.0 lb

## 2021-12-11 DIAGNOSIS — F250 Schizoaffective disorder, bipolar type: Secondary | ICD-10-CM

## 2021-12-11 NOTE — Progress Notes (Signed)
Patient arrived for Paliperidone Palmitate ER (INVEGA TRINZA) 546 MG/1.75ML Injection. ?Injection tolerated well in Left Arm. ?Not very Talkative But Pleasant as Always ?

## 2021-12-30 ENCOUNTER — Emergency Department (HOSPITAL_COMMUNITY)
Admission: EM | Admit: 2021-12-30 | Discharge: 2021-12-30 | Disposition: A | Discharge status: Home / Self Care | Payer: Medicaid Other | Attending: Emergency Medicine | Admitting: Emergency Medicine

## 2021-12-30 DIAGNOSIS — Z91148 Patient's other noncompliance with medication regimen for other reason: Secondary | ICD-10-CM | POA: Insufficient documentation

## 2021-12-30 DIAGNOSIS — R251 Tremor, unspecified: Secondary | ICD-10-CM | POA: Diagnosis not present

## 2021-12-30 DIAGNOSIS — R569 Unspecified convulsions: Secondary | ICD-10-CM

## 2021-12-30 DIAGNOSIS — Z91199 Patient's noncompliance with other medical treatment and regimen due to unspecified reason: Secondary | ICD-10-CM

## 2021-12-30 DIAGNOSIS — G40909 Epilepsy, unspecified, not intractable, without status epilepticus: Secondary | ICD-10-CM | POA: Diagnosis not present

## 2021-12-30 LAB — CBC WITH DIFFERENTIAL/PLATELET
Abs Immature Granulocytes: 0.03 10*3/uL (ref 0.00–0.07)
Basophils Absolute: 0 10*3/uL (ref 0.0–0.1)
Basophils Relative: 1 %
Eosinophils Absolute: 0 10*3/uL (ref 0.0–0.5)
Eosinophils Relative: 0 %
HCT: 46.2 % (ref 39.0–52.0)
Hemoglobin: 15.8 g/dL (ref 13.0–17.0)
Immature Granulocytes: 0 %
Lymphocytes Relative: 30 %
Lymphs Abs: 2.5 10*3/uL (ref 0.7–4.0)
MCH: 28.8 pg (ref 26.0–34.0)
MCHC: 34.2 g/dL (ref 30.0–36.0)
MCV: 84.2 fL (ref 80.0–100.0)
Monocytes Absolute: 0.4 10*3/uL (ref 0.1–1.0)
Monocytes Relative: 5 %
Neutro Abs: 5.3 10*3/uL (ref 1.7–7.7)
Neutrophils Relative %: 64 %
Platelets: 238 10*3/uL (ref 150–400)
RBC: 5.49 MIL/uL (ref 4.22–5.81)
RDW: 12.2 % (ref 11.5–15.5)
WBC: 8.2 10*3/uL (ref 4.0–10.5)
nRBC: 0 % (ref 0.0–0.2)

## 2021-12-30 LAB — COMPREHENSIVE METABOLIC PANEL
ALT: 21 U/L (ref 0–44)
AST: 26 U/L (ref 15–41)
Albumin: 4.3 g/dL (ref 3.5–5.0)
Alkaline Phosphatase: 38 U/L (ref 38–126)
Anion gap: 10 (ref 5–15)
BUN: 12 mg/dL (ref 6–20)
CO2: 23 mmol/L (ref 22–32)
Calcium: 9.2 mg/dL (ref 8.9–10.3)
Chloride: 103 mmol/L (ref 98–111)
Creatinine, Ser: 1.03 mg/dL (ref 0.61–1.24)
GFR, Estimated: >60 mL/min (ref >60)
Glucose, Bld: 115 mg/dL — ABNORMAL HIGH (ref 70–99)
Potassium: 3.8 mmol/L (ref 3.5–5.1)
Sodium: 136 mmol/L (ref 135–145)
Total Bilirubin: 0.7 mg/dL (ref 0.3–1.2)
Total Protein: 7.2 g/dL (ref 6.5–8.1)

## 2021-12-30 LAB — VALPROIC ACID LEVEL: Valproic Acid Lvl: <10 ug/mL — ABNORMAL LOW (ref 50.0–100.0)

## 2021-12-30 MED ORDER — VALPROATE SODIUM 100 MG/ML IV SOLN
500.0000 mg | Freq: Once | INTRAVENOUS | Status: AC
  Administered 2021-12-30: 500 mg via INTRAVENOUS
  Filled 2021-12-30: qty 5

## 2021-12-30 MED ORDER — SODIUM CHLORIDE 0.9 % IV BOLUS
1000.0000 mL | Freq: Once | INTRAVENOUS | Status: AC
  Administered 2021-12-30: 1000 mL via INTRAVENOUS

## 2021-12-30 NOTE — ED Provider Notes (Signed)
?[facility] EMERGENCY DEPARTMENT ?Provider Note ? ?CSN: [id number] ?Arrival date & time: 12/30/21 0242 ? ?Chief Complaint(s) ?Seizures ? ?HPI ?Dylan Moon is a 26 y.o. male   ? ? ?Seizures ?Seizure activity on arrival: no   ?Seizure type:  Grand mal ?Initial focality:  None ?Episode characteristics: generalized shaking   ?Postictal symptoms: confusion   ?Return to baseline: yes   ?Duration: <1 min. ?Timing:  Once ?Progression:  Resolved ?Context comment:  History of non-compliance in the past. had increase in his Depokene and Aptiom in Jan on review of Neurology note ?PTA treatment:  Midazolam ?History of seizures: yes   ? ?EMS reported that the patient's girlfriend mentioned that he might of missed a dose.  Patient states that he is taking his medicine as he is prescribed.  ? ?Past Medical History ?Past Medical History:  ?Diagnosis Date  ? ADHD   ? Bipolar 1 disorder (HCC)   ? Schizoaffective disorder (HCC)   ? Scoliosis   ? Seizures (HCC)   ? most recent 12/02/17  ? ?Patient Active Problem List  ? Diagnosis Date Noted  ? Schizoaffective disorder, bipolar type (HCC) 08/09/2020  ? Unspecified intellectual disabilities 08/09/2020  ? Family discord 03/15/2020  ? Aggressive behavior 03/15/2020  ? HCAP (healthcare-associated pneumonia) 12/27/2019  ? Status epilepticus (HCC) 12/24/2019  ? Acute respiratory failure with hypoxemia (HCC)   ? Seizures (HCC) 07/10/2018  ? 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 Noncompliance with medication regimen 12/11/2017  ? Seizure (HCC) 05/24/2016  ? Dehydration, mild 05/24/2016  ? AKI (acute kidney injury) (HCC) 05/24/2016  ? Marijuana use 05/24/2016  ? ?Home Medication(s) ?Prior to Admission medications   ?Medication Sig Start Date End Date Taking? Authorizing Provider  ?acetaminophen (TYLENOL) 500 MG tablet Take 500 mg by mouth every 6 (six) hours as needed for mild pain or headache.    [provider]  ?ALPRAZolam  Prudy Feeler) 0.25 MG tablet Take 0.25 mg by mouth daily as needed for anxiety. 06/27/20   [provider]  ?APTIOM 800 MG TABS Take 1,600 mg by mouth daily. 07/20/20   Lanae Boast, MD  ?busPIRone (BUSPAR) 10 MG tablet Take 1 tablet (10 mg total) by mouth 2 (two) times daily. 07/18/20   Lanae Boast, MD  ?carvedilol (COREG) 6.25 MG tablet Take 1 tablet (6.25 mg total) by mouth 2 (two) times daily with a meal. 07/20/20 09/13/21  Lanae Boast, MD  ?clonazePAM (KLONOPIN) 0.5 MG disintegrating tablet Take 0.5 mg by mouth daily as needed for anxiety. 12/12/20   [provider]  ?divalproex (DEPAKOTE ER) 500 MG 24 hr tablet Take 4 tablets (2,000 mg total) by mouth daily. 07/20/20 09/13/21  Lanae Boast, MD  ?escitalopram (LEXAPRO) 10 MG tablet Take 1 tablet (10 mg total) by mouth at bedtime. 07/19/20   Maryagnes Amos, FNP  ?famotidine (PEPCID) 20 MG tablet Take 20 mg by mouth every morning. 07/17/21   [provider]  ?lactulose (CHRONULAC) 10 GM/15ML solution Take 15 mLs by mouth at bedtime. 05/11/21   [provider]  ?losartan (COZAAR) 100 MG tablet Take 1 tablet (100 mg total) by mouth daily. 07/20/20 09/13/21  Lanae Boast, MD  ?NAYZILAM 5 MG/0.1ML SOLN Place 5 mg into the nose daily as needed. 04/17/21   Terald Sleeper, MD  ?Paliperidone Palmitate ER (INVEGA TRINZA) 546 MG/1.75ML SUSY Inject 1.8 mLs (561 mg total) into the muscle every 3 (three) months. 12/11/21   Lenard Lance,  FNP  ?promethazine (PHENERGAN) 6.25 MG/5ML syrup Take 12.5 mg by mouth every 6 (six) hours as needed for nausea/vomiting. 09/10/21   [provider]  ?valproic acid (DEPAKENE) 250 MG/5ML solution Take 20 mLs by mouth in the morning and at bedtime. 08/20/21   [provider]  ?Vitamin D, Ergocalciferol, (DRISDOL) 1.25 MG (50000 UNIT) CAPS capsule Take 50,000 Units by mouth every Monday.  01/24/20   [provider]  ?                                                                                                                                   ?Allergies ?Keppra [levetiracetam], Tramadol, and Vimpat [lacosamide] ? ?Review of Systems ?Review of Systems  ?Neurological:  Positive for seizures.  ?As noted in HPI ? ?Physical Exam ?Vital Signs  ?I have reviewed the triage vital signs ?BP 130/88   Pulse 76   Temp 98.4 ?F (36.9 ?C) (Oral)   Resp (!) 24   Ht 5\' 11"  (1.803 m)   Wt 95.7 kg   SpO2 99%   BMI 29.43 kg/m?  ? ?Physical Exam ?Vitals reviewed.  ?Constitutional:   ?   General: He is not in acute distress. ?   Appearance: He is well-developed. He is not diaphoretic.  ?HENT:  ?   Head: Normocephalic and atraumatic.  ?   Right Ear: External ear normal.  ?   Left Ear: External ear normal.  ?   Nose: Nose normal.  ?   Mouth/Throat:  ?   Mouth: Mucous membranes are moist.  ?   Comments: Superficial bite marks on tongue. No deep lacerations ?Eyes:  ?   General: No scleral icterus.    ?   Right eye: No discharge.     ?   Left eye: No discharge.  ?   Conjunctiva/sclera: Conjunctivae normal.  ?   Pupils: Pupils are equal, round, and reactive to light.  ?Neck:  ?   Trachea: Phonation normal.  ?Cardiovascular:  ?   Rate and Rhythm: Normal rate and regular rhythm.  ?   Heart sounds: No murmur heard. ?  No friction rub. No gallop.  ?Pulmonary:  ?   Effort: Pulmonary effort is normal. No respiratory distress.  ?   Breath sounds: Normal breath sounds. No stridor. No rales.  ?Abdominal:  ?   General: There is no distension.  ?   Palpations: Abdomen is soft.  ?   Tenderness: There is no abdominal tenderness.  ?Musculoskeletal:     ?   General: No tenderness. Normal range of motion.  ?   Cervical back: Normal range of motion and neck supple.  ?Skin: ?   General: Skin is warm and dry.  ?   Findings: No erythema or rash.  ?Neurological:  ?   Mental Status: He is alert and oriented to person, place, and time.  ?Psychiatric:     ?   Behavior:  Behavior normal.  ? ? ?ED Results and Treatments ?Labs ?(all labs ordered are  listed, but only abnormal results are displayed) ?Labs Reviewed  ?COMPREHENSIVE METABOLIC PANEL - Abnormal; Notable for the following components:  ?    Result Value  ? Glucose, Bld 115 (*)   ? All other components within normal limits  ?VALPROIC ACID LEVEL - Abnormal; Notable for the following components:  ? Valproic Acid Lvl <10 (*)   ? All other components within normal limits  ?CBC WITH DIFFERENTIAL/PLATELET  ?                                                                                                                       ?EKG ? EKG Interpretation ? ?Date/Time:  Sunday December 30 2021 02:45:31 EDT ?Ventricular Rate:  98 ?PR Interval:  164 ?QRS Duration: 87 ?QT Interval:  335 ?QTC Calculation: 428 ?R Axis:   64 ?Text Interpretation: Sinus rhythm No acute changes Confirmed by Drema Pryardama, Tirzah Fross [phone] on 12/30/2021 4:13:00 AM ?  ? ?  ? ?Radiology ?No results found. ? ?Pertinent labs & imaging results that were available during my care of the patient were reviewed by me and considered in my medical decision making (see MDM for details). ? ?Medications Ordered in ED ?Medications  ?valproate (DEPACON) 500 mg in dextrose 5 % 50 mL IVPB (500 mg Intravenous New Bag/Given 12/30/21 0559)  ?sodium chloride 0.9 % bolus 1,000 mL (0 mLs Intravenous Stopped 12/30/21 0605)  ?                                                               ?                                                                    ?Procedures ?Procedures ? ?(including critical care time) ? ?Medical Decision Making / ED Course ? ? ? Complexity of Problem: ? ?Co-morbidities/SDOH that complicate the patient evaluation/care: ?Schizophrenia and bipolar, epilepsy with a history of noncompliance ? ?Additional history obtained: ?Chart review as noted above in HPI ? ?Patient's presenting problem/concern, DDX, and MDM listed below: ?Grand mal seizure ?Lasted less than 1 minute. ?Back to baseline. ?Sustaining bite marks to tongue.  No large lacerations requiring primary  closure ?Given history of noncompliance, this may be the cause of his breakthrough seizure. ?Consider CT head but not necessary at this time given lack of head trauma on exam and the fact the patient is back

## 2022-01-03 ENCOUNTER — Other Ambulatory Visit: Payer: Self-pay | Admitting: Internal Medicine

## 2022-01-04 LAB — COMPLETE METABOLIC PANEL WITH GFR
AG Ratio: 1.8 (calc) (ref 1.0–2.5)
ALT: 13 U/L (ref 9–46)
AST: 13 U/L (ref 10–40)
Albumin: 4.5 g/dL (ref 3.6–5.1)
Alkaline phosphatase (APISO): 37 U/L (ref 36–130)
BUN: 7 mg/dL (ref 7–25)
CO2: 23 mmol/L (ref 20–32)
Calcium: 9.9 mg/dL (ref 8.6–10.3)
Chloride: 102 mmol/L (ref 98–110)
Creat: 1 mg/dL (ref 0.60–1.24)
Globulin: 2.5 g/dL (calc) (ref 1.9–3.7)
Glucose, Bld: 129 mg/dL — ABNORMAL HIGH (ref 65–99)
Potassium: 4.1 mmol/L (ref 3.5–5.3)
Sodium: 140 mmol/L (ref 135–146)
Total Bilirubin: 0.5 mg/dL (ref 0.2–1.2)
Total Protein: 7 g/dL (ref 6.1–8.1)
eGFR: 106 mL/min/{1.73_m2} (ref >=60)

## 2022-01-04 LAB — LIPID PANEL
Cholesterol: 166 mg/dL (ref <200)
HDL: 41 mg/dL (ref >=40)
LDL Cholesterol (Calc): 105 mg/dL (calc) — ABNORMAL HIGH
Non-HDL Cholesterol (Calc): 125 mg/dL (calc) (ref <130)
Total CHOL/HDL Ratio: 4 (calc) (ref <5.0)
Triglycerides: 100 mg/dL (ref <150)

## 2022-01-04 LAB — CBC
HCT: 43 % (ref 38.5–50.0)
Hemoglobin: 14.4 g/dL (ref 13.2–17.1)
MCH: 28.3 pg (ref 27.0–33.0)
MCHC: 33.5 g/dL (ref 32.0–36.0)
MCV: 84.5 fL (ref 80.0–100.0)
MPV: 11 fL (ref 7.5–12.5)
Platelets: 263 10*3/uL (ref 140–400)
RBC: 5.09 10*6/uL (ref 4.20–5.80)
RDW: 12.1 % (ref 11.0–15.0)
WBC: 7.5 10*3/uL (ref 3.8–10.8)

## 2022-01-04 LAB — VITAMIN D 25 HYDROXY (VIT D DEFICIENCY, FRACTURES): Vit D, 25-Hydroxy: 34 ng/mL (ref 30–100)

## 2022-01-04 LAB — TSH: TSH: 1.29 mIU/L (ref 0.40–4.50)

## 2022-01-04 LAB — VALPROIC ACID LEVEL: Valproic Acid Lvl: 137.2 mg/L — ABNORMAL HIGH (ref 50.0–100.0)

## 2022-02-16 ENCOUNTER — Emergency Department (HOSPITAL_COMMUNITY)
Admission: EM | Admit: 2022-02-16 | Discharge: 2022-02-16 | Disposition: A | Discharge status: Home / Self Care | Payer: Medicaid Other | Attending: Emergency Medicine | Admitting: Emergency Medicine

## 2022-02-16 ENCOUNTER — Encounter (HOSPITAL_COMMUNITY): Payer: Self-pay

## 2022-02-16 ENCOUNTER — Other Ambulatory Visit: Payer: Self-pay

## 2022-02-16 DIAGNOSIS — R569 Unspecified convulsions: Secondary | ICD-10-CM

## 2022-02-16 DIAGNOSIS — Z79899 Other long term (current) drug therapy: Secondary | ICD-10-CM | POA: Diagnosis not present

## 2022-02-16 DIAGNOSIS — G40909 Epilepsy, unspecified, not intractable, without status epilepticus: Secondary | ICD-10-CM | POA: Diagnosis not present

## 2022-02-16 DIAGNOSIS — I10 Essential (primary) hypertension: Secondary | ICD-10-CM | POA: Diagnosis not present

## 2022-02-16 LAB — CBC WITH DIFFERENTIAL/PLATELET
Abs Immature Granulocytes: 0.03 10*3/uL (ref 0.00–0.07)
Basophils Absolute: 0 10*3/uL (ref 0.0–0.1)
Basophils Relative: 0 %
Eosinophils Absolute: 0 10*3/uL (ref 0.0–0.5)
Eosinophils Relative: 0 %
HCT: 45.1 % (ref 39.0–52.0)
Hemoglobin: 15.3 g/dL (ref 13.0–17.0)
Immature Granulocytes: 0 %
Lymphocytes Relative: 33 %
Lymphs Abs: 2.2 10*3/uL (ref 0.7–4.0)
MCH: 28.8 pg (ref 26.0–34.0)
MCHC: 33.9 g/dL (ref 30.0–36.0)
MCV: 84.8 fL (ref 80.0–100.0)
Monocytes Absolute: 0.4 10*3/uL (ref 0.1–1.0)
Monocytes Relative: 6 %
Neutro Abs: 4 10*3/uL (ref 1.7–7.7)
Neutrophils Relative %: 61 %
Platelets: 268 10*3/uL (ref 150–400)
RBC: 5.32 MIL/uL (ref 4.22–5.81)
RDW: 12.6 % (ref 11.5–15.5)
WBC: 6.8 10*3/uL (ref 4.0–10.5)
nRBC: 0 % (ref 0.0–0.2)

## 2022-02-16 LAB — BASIC METABOLIC PANEL
Anion gap: 16 — ABNORMAL HIGH (ref 5–15)
BUN: 9 mg/dL (ref 6–20)
CO2: 17 mmol/L — ABNORMAL LOW (ref 22–32)
Calcium: 9.6 mg/dL (ref 8.9–10.3)
Chloride: 105 mmol/L (ref 98–111)
Creatinine, Ser: 1.31 mg/dL — ABNORMAL HIGH (ref 0.61–1.24)
GFR, Estimated: >60 mL/min (ref >60)
Glucose, Bld: 166 mg/dL — ABNORMAL HIGH (ref 70–99)
Potassium: 4.8 mmol/L (ref 3.5–5.1)
Sodium: 138 mmol/L (ref 135–145)

## 2022-02-16 LAB — VALPROIC ACID LEVEL: Valproic Acid Lvl: 13 ug/mL — ABNORMAL LOW (ref 50.0–100.0)

## 2022-02-16 MED ORDER — LORAZEPAM 2 MG/ML IJ SOLN: 4.0000 mg | INTRAMUSCULAR | Status: DC | PRN

## 2022-02-16 MED ORDER — SODIUM CHLORIDE 0.9 % IV BOLUS: 1000.0000 mL | Freq: Once | INTRAVENOUS | Status: DC

## 2022-02-16 MED ORDER — DIVALPROEX SODIUM ER 500 MG PO TB24
500.0000 mg | ORAL_TABLET | Freq: Every day | ORAL | Status: DC
  Filled 2022-02-16: qty 1

## 2022-02-16 NOTE — ED Provider Notes (Signed)
[facility] EMERGENCY DEPARTMENT Provider Note   CSN: [id number] Arrival date & time: 02/16/22  1124     History  Chief Complaint  Patient presents with   Seizures    Dylan Moon is a 26 y.o. male.  26 year old male with past medical history of seizures, schizoaffective disorder, bipolar 1 disorder and ADHD brought in by EMS for seizure which occurred at home today.  Seizure was witnessed by patient's roommate, occurred while he was lying in bed, lasted 2 to 3 minutes, followed by postictal state.  EMS found patient hypertensive and tachycardic with a heart rate in the 170s, improved with IV fluids.  Patient arrives to the ER, more alert and responsive.  Patient is on Depakote, denies any recent missed doses.  Does smoke marijuana on occasion, no recent falls, injuries, illness.  Reports he bit the left side of his tongue, did not lose bladder control, no other complaints or concerns today.      Home Medications Prior to Admission medications   Medication Sig Start Date End Date Taking? Authorizing Provider  acetaminophen (TYLENOL) 500 MG tablet Take 500 mg by mouth every 6 (six) hours as needed for mild pain or headache.    [provider]  ALPRAZolam Prudy Feeler) 0.25 MG tablet Take 0.25 mg by mouth daily as needed for anxiety. 06/27/20   [provider]  APTIOM 800 MG TABS Take 1,600 mg by mouth daily. 07/20/20   Lanae Boast, MD  busPIRone (BUSPAR) 10 MG tablet Take 1 tablet (10 mg total) by mouth 2 (two) times daily. 07/18/20   Lanae Boast, MD  carvedilol (COREG) 6.25 MG tablet Take 1 tablet (6.25 mg total) by mouth 2 (two) times daily with a meal. 07/20/20 09/13/21  Lanae Boast, MD  clonazePAM (KLONOPIN) 0.5 MG disintegrating tablet Take 0.5 mg by mouth daily as needed for anxiety. 12/12/20   [provider]  divalproex (DEPAKOTE ER) 500 MG 24 hr tablet Take 4 tablets (2,000 mg total) by mouth daily. 07/20/20 09/13/21  Lanae Boast, MD   escitalopram (LEXAPRO) 10 MG tablet Take 1 tablet (10 mg total) by mouth at bedtime. 07/19/20   Starkes-Perry, Juel Burrow, FNP  famotidine (PEPCID) 20 MG tablet Take 20 mg by mouth every morning. 07/17/21   [provider]  lactulose (CHRONULAC) 10 GM/15ML solution Take 15 mLs by mouth at bedtime. 05/11/21   [provider]  losartan (COZAAR) 100 MG tablet Take 1 tablet (100 mg total) by mouth daily. 07/20/20 09/13/21  Lanae Boast, MD  NAYZILAM 5 MG/0.1ML SOLN Place 5 mg into the nose daily as needed. 04/17/21   Terald Sleeper, MD  Paliperidone Palmitate ER (INVEGA TRINZA) 546 MG/1.75ML SUSY Inject 1.8 mLs (561 mg total) into the muscle every 3 (three) months. 12/11/21   Lenard Lance, FNP  promethazine (PHENERGAN) 6.25 MG/5ML syrup Take 12.5 mg by mouth every 6 (six) hours as needed for nausea/vomiting. 09/10/21   [provider]  valproic acid (DEPAKENE) 250 MG/5ML solution Take 20 mLs by mouth in the morning and at bedtime. 08/20/21   [provider]  Vitamin D, Ergocalciferol, (DRISDOL) 1.25 MG (50000 UNIT) CAPS capsule Take 50,000 Units by mouth every Monday.  01/24/20   [provider]      Allergies    Keppra [levetiracetam], Tramadol, and Vimpat [lacosamide]    Review of Systems   Review of Systems Negative except as per HPI Physical Exam Updated Vital Signs BP (!) 120/95  Pulse (!) 108   Temp 99.1 F (37.3 C) (Oral)   Resp 20   Ht 5\' 11"  (1.803 m)   SpO2 98%   BMI 29.43 kg/m  Physical Exam Vitals and nursing note reviewed.  Constitutional:      General: He is not in acute distress.    Appearance: He is well-developed. He is not diaphoretic.  HENT:     Head: Normocephalic and atraumatic.     Comments: Bite wound left side tongue, no ongoing bleeding    Nose: Nose normal.     Mouth/Throat:     Mouth: Mucous membranes are moist.  Eyes:     Extraocular Movements: Extraocular movements intact.     Pupils: Pupils are equal, round,  and reactive to light.  Cardiovascular:     Rate and Rhythm: Normal rate and regular rhythm.     Heart sounds: Normal heart sounds.  Pulmonary:     Effort: Pulmonary effort is normal.     Breath sounds: Normal breath sounds.  Abdominal:     Palpations: Abdomen is soft.     Tenderness: There is no abdominal tenderness.  Musculoskeletal:     Cervical back: Neck supple.  Skin:    General: Skin is warm and dry.     Findings: No erythema or rash.  Neurological:     Mental Status: He is alert and oriented to person, place, and time.  Psychiatric:        Behavior: Behavior normal.    ED Results / Procedures / Treatments   Labs (all labs ordered are listed, but only abnormal results are displayed) Labs Reviewed  VALPROIC ACID LEVEL - Abnormal; Notable for the following components:      Result Value   Valproic Acid Lvl 13 (*)    All other components within normal limits  BASIC METABOLIC PANEL - Abnormal; Notable for the following components:   CO2 17 (*)    Glucose, Bld 166 (*)    Creatinine, Ser 1.31 (*)    Anion gap 16 (*)    All other components within normal limits  CBC WITH DIFFERENTIAL/PLATELET  CBG MONITORING, ED    EKG None  Radiology No results found.  Procedures Procedures    Medications Ordered in ED Medications  sodium chloride 0.9 % bolus 1,000 mL (has no administration in time range)  LORazepam (ATIVAN) injection 4 mg (has no administration in time range)  divalproex (DEPAKOTE ER) 24 hr tablet 500 mg (has no administration in time range)    ED Course/ Medical Decision Making/ A&P                           Medical Decision Making Amount and/or Complexity of Data Reviewed Labs: ordered.  Risk Prescription drug management.   This patient presents to the ED for concern of seizure, this involves an extensive number of treatment options, and is a complaint that carries with it a high risk of complications and morbidity.  The differential diagnosis  includes non compliance, marijuana use, seizure   Co morbidities that complicate the patient evaluation  Seizure disorder    Additional history obtained:  Additional history obtained from EMS, postictal on arrival, HTN, tachycardic, improved with IVF.  External records from outside source obtained and reviewed including prior ER visit on December 30, 2021 for seizure, found to be subtherapeutic on his Depakote.   Lab Tests:  I Ordered, and personally interpreted labs.  The pertinent results  include: CBC unremarkable, BMP with glucose elevated at 166 and creatinine of 1.3.  Bicarb 17 with gap of 16.  Depakote is subtherapeutic at 13  Cardiac Monitoring: / EKG:  The patient was maintained on a cardiac monitor.  I personally viewed and interpreted the cardiac monitored which showed an underlying rhythm of: sinus tachycardia    Problem List / ED Course / Critical interventions / Medication management  26 year old male presents with EMS after a seizure with history of seizure disorder.  Patient reports compliance with his Depakote and denies any missed doses.  Labs were ordered with check of Depakote level however patient was adamant that he leave without further evaluation and did not want to wait for his lab results.  Patient's girlfriend at bedside states that he has not taken his Depakote this morning, his regular Depakote dose was ordered however patient walked out without receiving this.  He was found to be subtherapeutic. I ordered medication including Depakote for seizure disorder Reevaluation of the patient after these medicines showed that the patient  left before treatment complete I have reviewed the patients home medicines and have made adjustments as needed   Social Determinants of Health:  Has PCP, known medical noncompliance   Test / Admission - Considered:  Patient was to have IV fluids and Depakote dose with as needed Ativan for further seizures however insistent on  leaving before completion of treatment. At the time patient walked out, he was awake, alert, ambulatory with a steady gait and capable of making his own decisions. Tommi RumpsDe was discharged with his significant other and friend.  s        Final Clinical Impression(s) / ED Diagnoses Final diagnoses:  Seizure [facility](HCC)    Rx / DC Orders ED Discharge Orders     None         Jeannie FendMurphy, Khylee Algeo A, PA-C 02/16/22 1400    Horton, Clabe SealKristie M, DO 02/16/22 1701

## 2022-02-16 NOTE — ED Notes (Addendum)
Patient agitated and pulling all the equipment off, states he is ready to go. Girlfriend at the bedside trying to calm down patient. Doctor talked with patient and asked that he at least stay and get a dose of his medication. Friends at bedside attempting to talk him into staying. Patient has removed his PIV that was placed by EMS.

## 2022-03-07 ENCOUNTER — Ambulatory Visit (HOSPITAL_COMMUNITY): Payer: Medicaid Other

## 2022-03-25 ENCOUNTER — Emergency Department (HOSPITAL_COMMUNITY)
Admission: EM | Admit: 2022-03-25 | Discharge: 2022-03-25 | Disposition: A | Discharge status: Home / Self Care | Payer: Medicaid Other | Attending: Emergency Medicine | Admitting: Emergency Medicine

## 2022-03-25 ENCOUNTER — Encounter (HOSPITAL_COMMUNITY): Payer: Self-pay

## 2022-03-25 DIAGNOSIS — Z5321 Procedure and treatment not carried out due to patient leaving prior to being seen by health care provider: Secondary | ICD-10-CM | POA: Insufficient documentation

## 2022-03-25 DIAGNOSIS — R569 Unspecified convulsions: Secondary | ICD-10-CM | POA: Diagnosis present

## 2022-03-25 DIAGNOSIS — Z5329 Procedure and treatment not carried out because of patient's decision for other reasons: Secondary | ICD-10-CM

## 2022-03-25 NOTE — ED Provider Notes (Signed)
[facility] EMERGENCY DEPARTMENT Provider Note   CSN: [id number] Arrival date & time: 03/25/22  0831     History Chief Complaint  Patient presents with   Seizures    Dylan Moon is a 26 y.o. male with h/o seizures presents to the ED for evaluation after a possible absent seizure today. Per EMS note, the patient was staring off for 1 minute, EMS reports he was post-ictal for 45 minutes for him, now GCS 15. The patient did not take his medication last night. He reports to me that he took his depakote but did not take his aptiom this AM. He reports that he is compliant with his medications otherwise. He has no complaints and would like to leave.    Seizures      Home Medications Prior to Admission medications   Medication Sig Start Date End Date Taking? Authorizing Provider  acetaminophen (TYLENOL) 500 MG tablet Take 500 mg by mouth every 6 (six) hours as needed for mild pain or headache.    [provider]  ALPRAZolam Prudy Feeler) 0.25 MG tablet Take 0.25 mg by mouth daily as needed for anxiety. 06/27/20   [provider]  APTIOM 800 MG TABS Take 1,600 mg by mouth daily. 07/20/20   Lanae Boast, MD  busPIRone (BUSPAR) 10 MG tablet Take 1 tablet (10 mg total) by mouth 2 (two) times daily. 07/18/20   Lanae Boast, MD  carvedilol (COREG) 6.25 MG tablet Take 1 tablet (6.25 mg total) by mouth 2 (two) times daily with a meal. 07/20/20 09/13/21  Lanae Boast, MD  clonazePAM (KLONOPIN) 0.5 MG disintegrating tablet Take 0.5 mg by mouth daily as needed for anxiety. 12/12/20   [provider]  divalproex (DEPAKOTE ER) 500 MG 24 hr tablet Take 4 tablets (2,000 mg total) by mouth daily. 07/20/20 09/13/21  Lanae Boast, MD  escitalopram (LEXAPRO) 10 MG tablet Take 1 tablet (10 mg total) by mouth at bedtime. 07/19/20   Starkes-Perry, Juel Burrow, FNP  famotidine (PEPCID) 20 MG tablet Take 20 mg by mouth every morning. 07/17/21   [provider]  lactulose  (CHRONULAC) 10 GM/15ML solution Take 15 mLs by mouth at bedtime. 05/11/21   [provider]  losartan (COZAAR) 100 MG tablet Take 1 tablet (100 mg total) by mouth daily. 07/20/20 09/13/21  Lanae Boast, MD  NAYZILAM 5 MG/0.1ML SOLN Place 5 mg into the nose daily as needed. 04/17/21   Terald Sleeper, MD  Paliperidone Palmitate ER (INVEGA TRINZA) 546 MG/1.75ML SUSY Inject 1.8 mLs (561 mg total) into the muscle every 3 (three) months. 12/11/21   Lenard Lance, FNP  promethazine (PHENERGAN) 6.25 MG/5ML syrup Take 12.5 mg by mouth every 6 (six) hours as needed for nausea/vomiting. 09/10/21   [provider]  valproic acid (DEPAKENE) 250 MG/5ML solution Take 20 mLs by mouth in the morning and at bedtime. 08/20/21   [provider]  Vitamin D, Ergocalciferol, (DRISDOL) 1.25 MG (50000 UNIT) CAPS capsule Take 50,000 Units by mouth every Monday.  01/24/20   [provider]      Allergies    Keppra [levetiracetam], Tramadol, and Vimpat [lacosamide]    Review of Systems   Review of Systems  Respiratory:  Negative for shortness of breath.   Cardiovascular:  Negative for chest pain.  Neurological:  Positive for seizures. Negative for headaches.    Physical Exam Updated Vital Signs BP (!) 149/90 (BP Location: Right Arm)   Pulse 78   Temp 98.4  F (36.9 C) (Oral)   Resp 20   Ht 5\' 11"  (1.803 m)   Wt 96 kg   SpO2 100%   BMI 29.52 kg/m  Physical Exam Vitals and nursing note reviewed.  Constitutional:      Appearance: Normal appearance.  HENT:     Head: Normocephalic and atraumatic.  Eyes:     General: No scleral icterus. Cardiovascular:     Rate and Rhythm: Normal rate and regular rhythm.  Pulmonary:     Effort: Pulmonary effort is normal. No respiratory distress.  Musculoskeletal:        General: No deformity.     Cervical back: Normal range of motion.  Skin:    General: Skin is warm and dry.  Neurological:     General: No focal deficit present.      Mental Status: He is alert and oriented to person, place, and time. Mental status is at baseline.     GCS: GCS eye subscore is 4. GCS verbal subscore is 5. GCS motor subscore is 6.     Cranial Nerves: No cranial nerve deficit.     Sensory: No sensory deficit.     Motor: No weakness or pronator drift.     Coordination: Finger-Nose-Finger Test normal.     Gait: Gait normal.     Comments: GCS 15.  Alert and oriented x4.  No cranial nerve deficit.  Patient is answering questions appropriate with appropriate speech.  No facial asymmetry.  Sensation intact throughout.  Patient strength is 5 out of 5 in patient's upper and lower bilateral extremities.  No pronator drift.  Normal finger-nose.  Patient is ambulatory without assistance and a normal gait.     ED Results / Procedures / Treatments   Labs (all labs ordered are listed, but only abnormal results are displayed) Labs Reviewed  CBC WITH DIFFERENTIAL/PLATELET  COMPREHENSIVE METABOLIC PANEL  URINALYSIS, ROUTINE W REFLEX MICROSCOPIC  VALPROIC ACID LEVEL    EKG None  Radiology No results found.  Procedures Procedures   Medications Ordered in ED Medications - No data to display  ED Course/ Medical Decision Making/ A&P                           Medical Decision Making Amount and/or Complexity of Data Reviewed Labs: ordered.   26 y/o M with h/o of seizures presents to the ER for evaluation after absent seizure today. Vital signs show elevated BP, otherwise normal. Physical exam shows a benign neurological exam. No acute distress. The patient discussed that he was ready to leave. I informed the patient on the importance of obtaining labs. He declines. We discussed risks of him leaving, the patient was still adamant about leaving.   Lab work was ordered, but again patient declined.   We discussed the nature and purpose, risks and benefits, as well as, the alternatives of treatment. Time was given to allow the opportunity to ask  questions and consider their options, and after the discussion, the patient decided to refuse the offerred treatment. The patient was informed that refusal could lead to, but was not limited to, death, permanent disability, or severe pain. If present, I asked the relatives or significant others to dissuade them without success. Prior to refusing, I determined that the patient had the capacity to make their decision and understood the consequences of that decision. After refusal, I made every reasonable opportunity to treat them to the best of my ability.  The patient was notified that they may return to the emergency department at any time for further treatment.    Ambulatory in the room with a steady gait.   Patient left prior to AVS paperwork.   Final Clinical Impression(s) / ED Diagnoses Final diagnoses:  Left against medical advice  Seizure [facility])    Rx / DC Orders ED Discharge Orders     None         Achille Rich, PA-C 03/25/22 0901    Sloan Leiter, DO 03/27/22 [phone]

## 2022-03-25 NOTE — ED Triage Notes (Signed)
Pt BIB [facility] for eval of seizure. Pt reportedly had an absent seizure, staring off for about 1 minute. EMS reports pt was post-ictal for about 45 minutes for them, now GCS 15. Per EMS pt did not take his medication last night. Arrives to ED CAOx4. No complaints, no injuries. CBG 137

## 2022-06-19 ENCOUNTER — Telehealth (HOSPITAL_COMMUNITY): Payer: Self-pay | Admitting: *Deleted

## 2022-06-19 NOTE — Telephone Encounter (Signed)
Call from [place], employee specialist with [employer] to update me that he has been consistently working. Currently he is at [employer]. Informed Josh he has not been coming here and gave him shot clinic times if he can coordinate with patient to come in for an assessment with a provider and get his injection if it will be continued.

## 2022-08-04 IMAGING — CT CT HEAD W/O CM
4 series · 17 of 47 positions shown, 19 images · non-contrast
Comparison: December 24, 2019

CLINICAL DATA: Seizure.

EXAM:
CT HEAD WITHOUT CONTRAST
TECHNIQUE: Contiguous axial images were obtained from the base of the skull
through the vertex without intravenous contrast.

[Series 3: head bone · axial · 0.47mm/px · z∈[-57,+1]mm · 4 of 84 slices shown]
[im 9/84  bone]
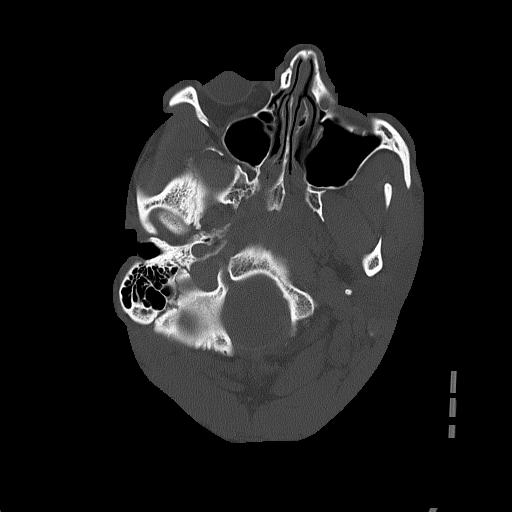
[im 17/84  bone]
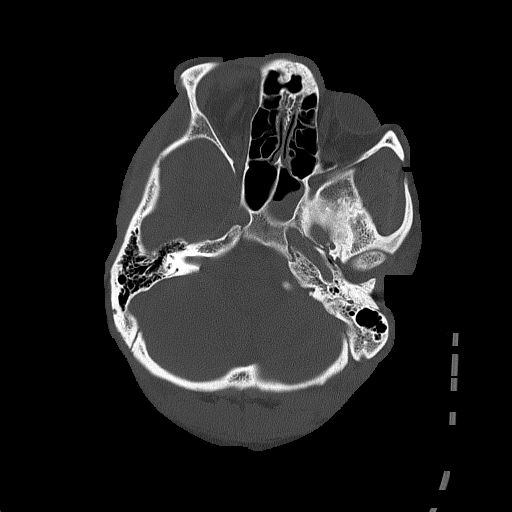
[im 25/84  bone]
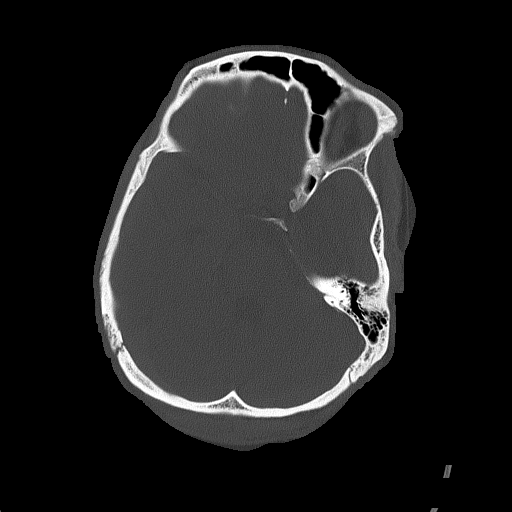
[im 38/84  bone]
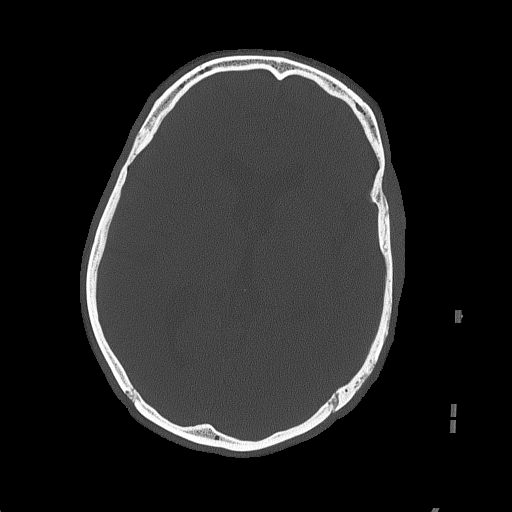

[Series 4: head without · axial · non-contrast · 0.47mm/px · z∈[-53,+67]mm · 7 of 34 slices shown, 9 images]
[im 5/34  brain]
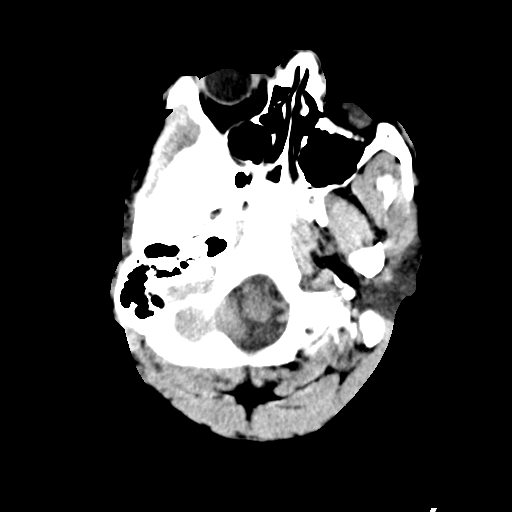
[im 5/34  bone]
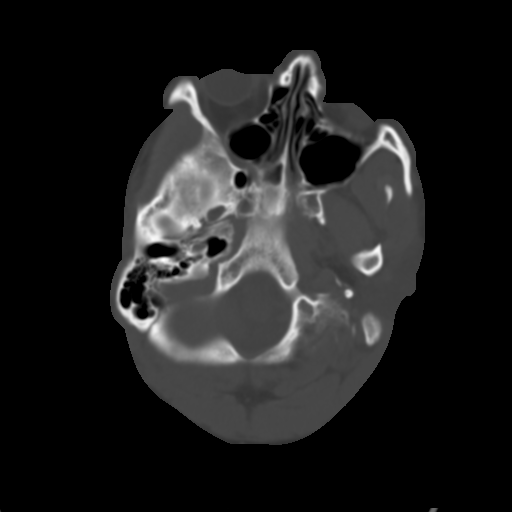
[im 9/34  brain]
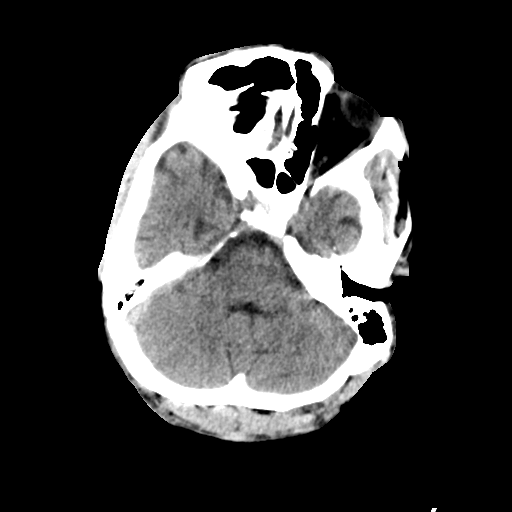
[im 13/34  brain]
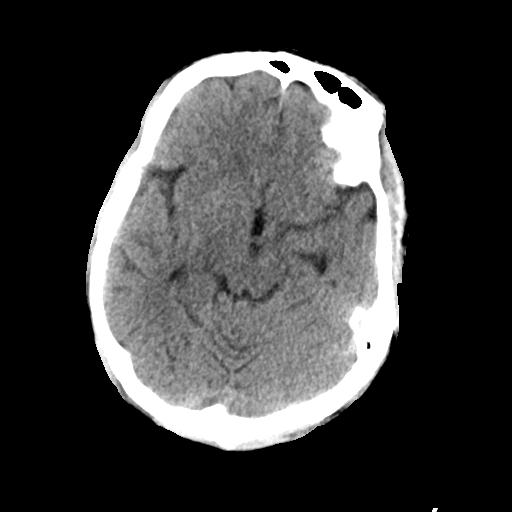
[im 17/34  brain]
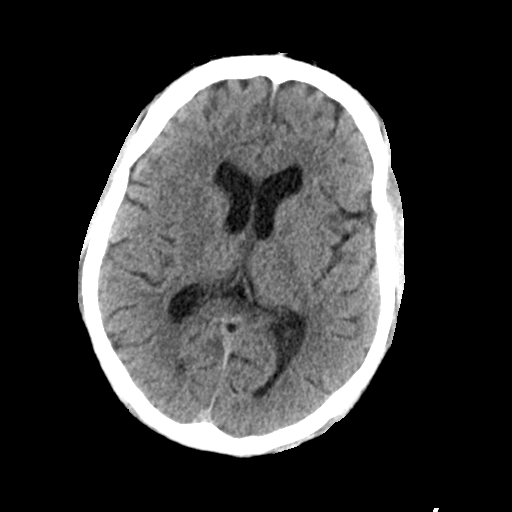
[im 21/34  brain]
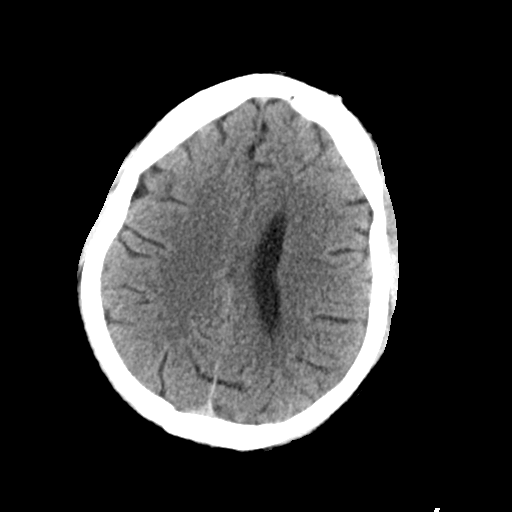
[im 21/34  bone]
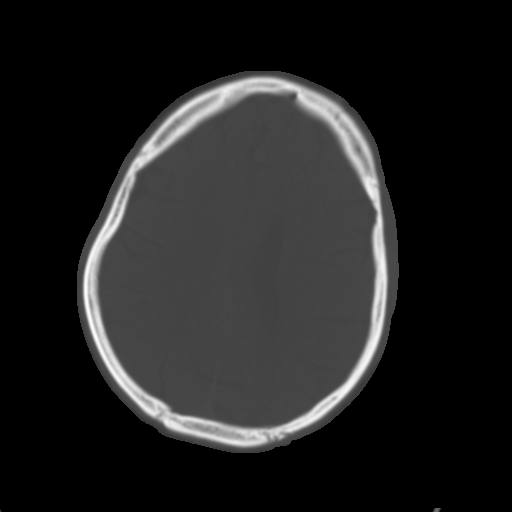
[im 25/34  brain]
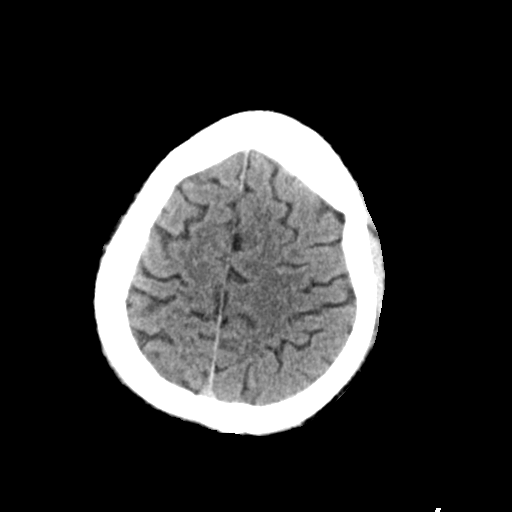
[im 29/34  brain]
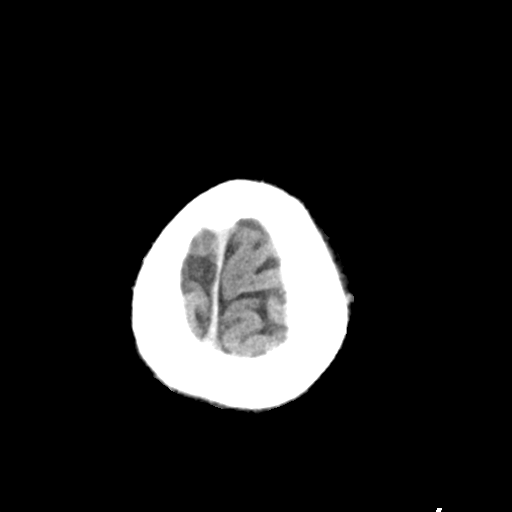

[Series 5: head without cor · coronal · non-contrast · 0.37mm/px · 3 of 69 slices shown]
[im 23/69  brain]
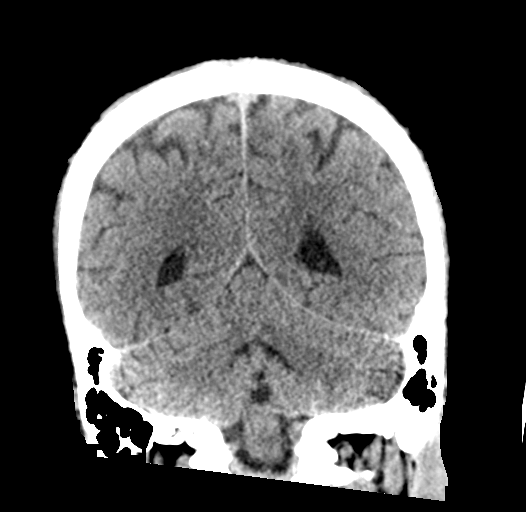
[im 31/69  brain]
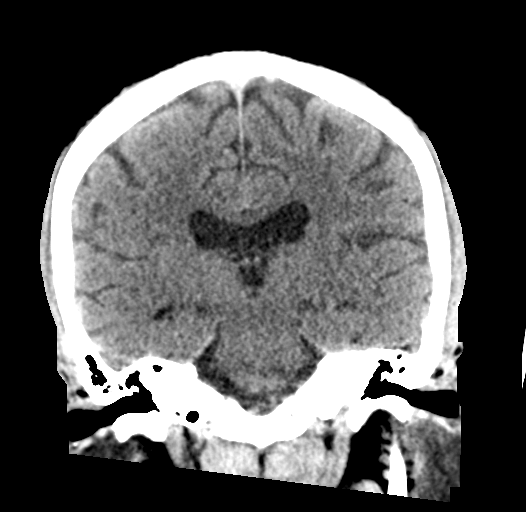
[im 38/69  brain]
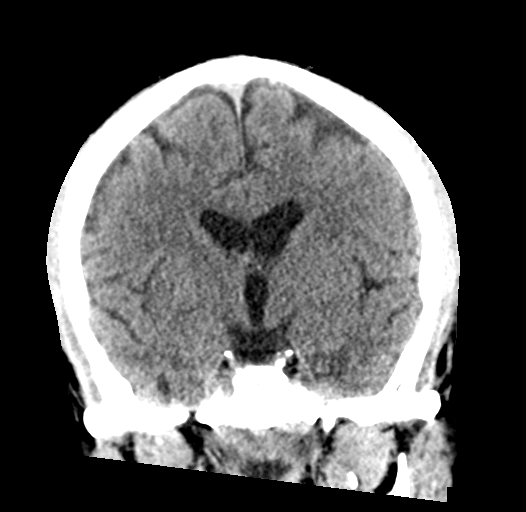

[Series 6: head without sag · sagittal · non-contrast · 0.34mm/px · 3 of 57 slices shown]
[im 19/57  brain]
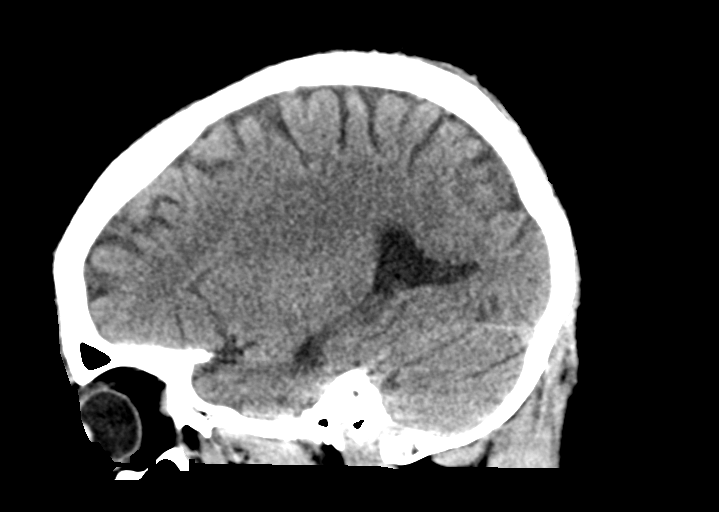
[im 29/57  brain]
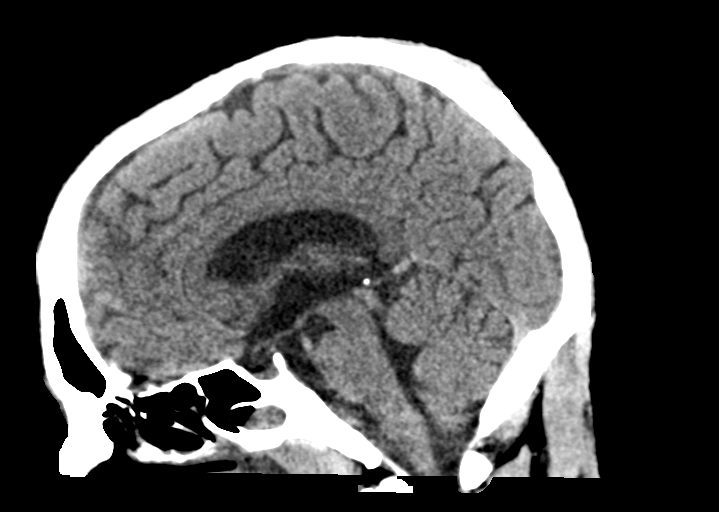
[im 38/57  brain]
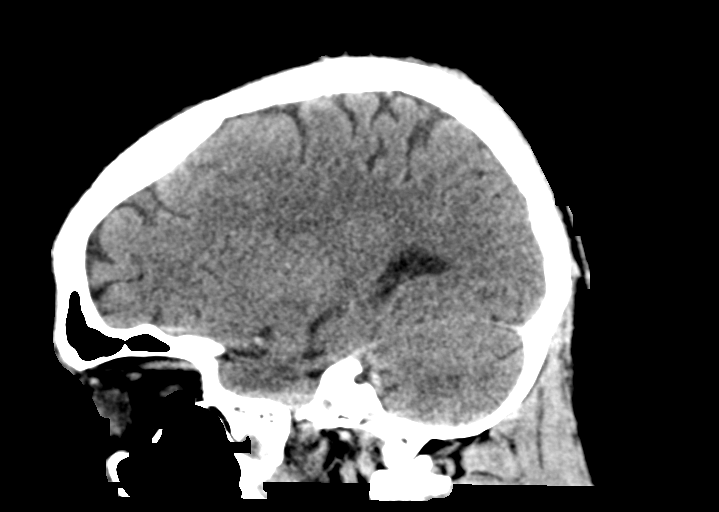

[17 of 47 positions shown; findings below may reference images not displayed]

FINDINGS: Brain: No evidence of acute infarction, hemorrhage, hydrocephalus,
extra-axial collection or mass lesion/mass effect.

Vascular: No hyperdense vessel or unexpected calcification.

Skull: Normal. Negative for fracture or focal lesion.

Sinuses/Orbits: No acute finding.

Other: None.
IMPRESSION: No focal acute intracranial abnormality identified.

## 2022-08-09 IMAGING — DX DG CHEST 1V PORT
1 series · 1 of 1 positions shown · non-contrast
Comparison: July 01, 2020

CLINICAL DATA: Pneumonia

EXAM:
PORTABLE CHEST 1 VIEW

[chest ap]
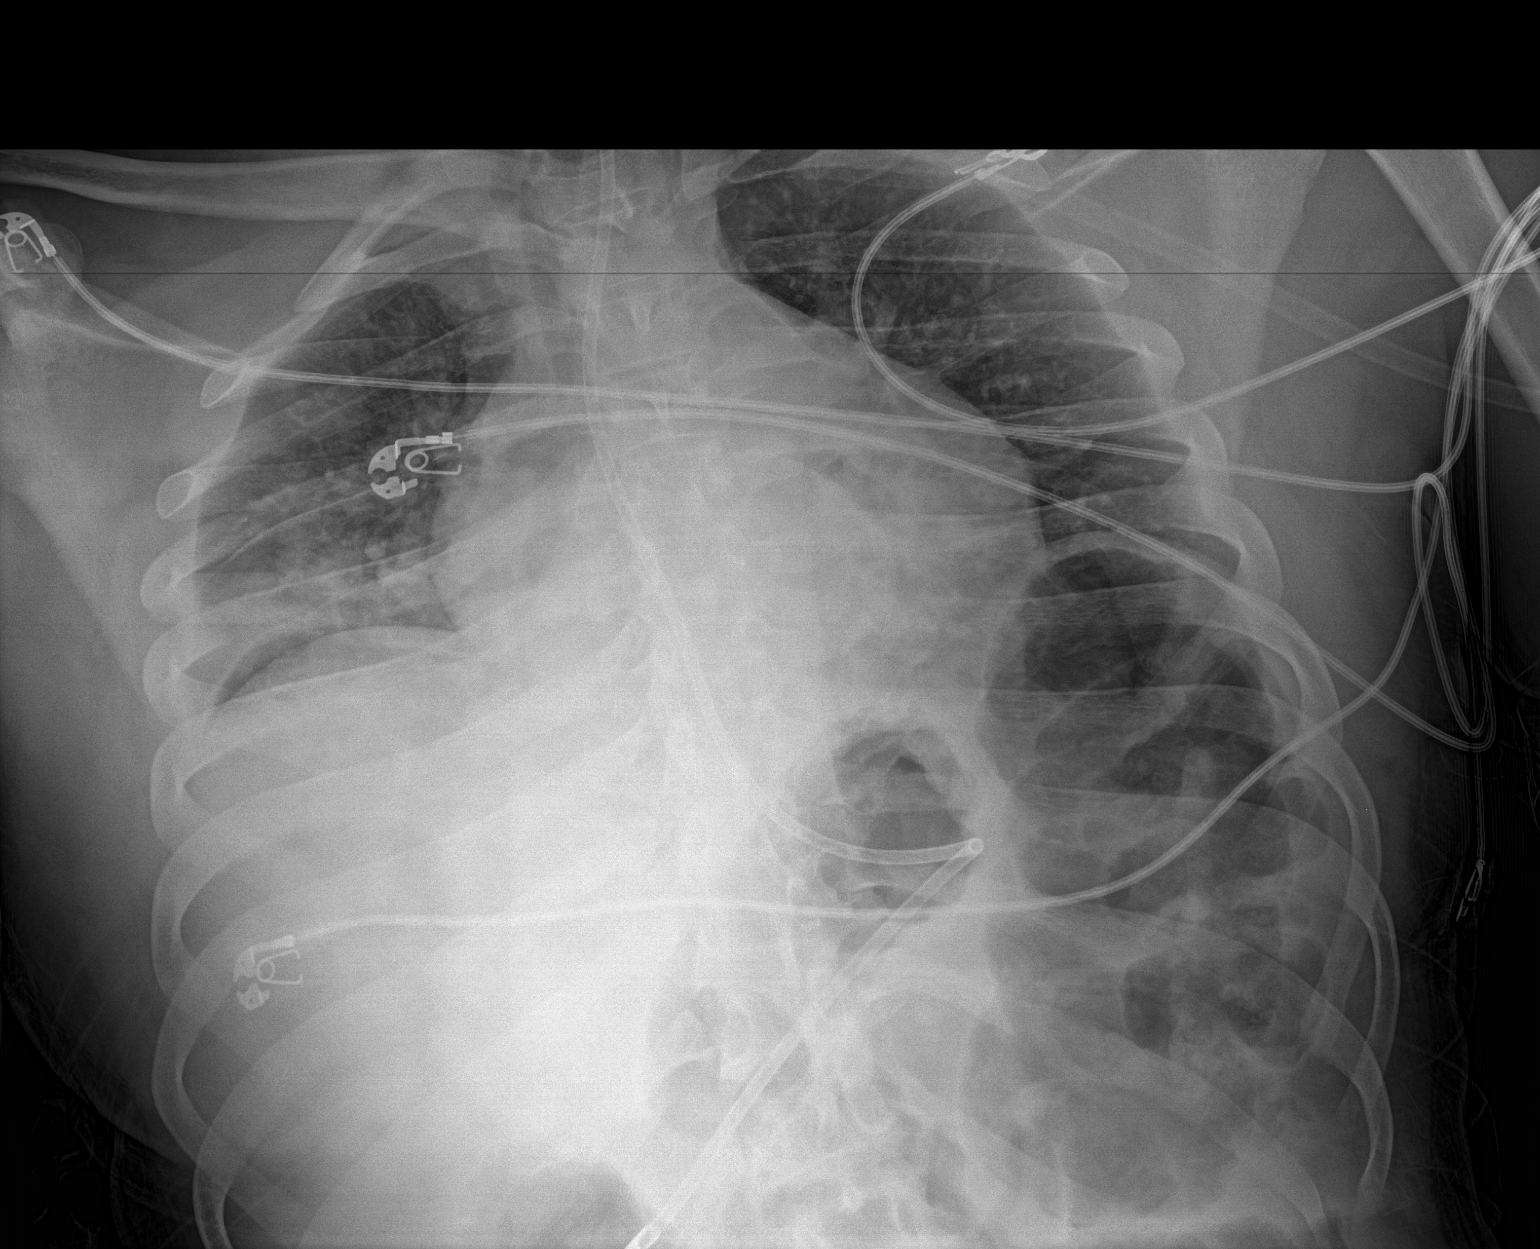

[1 of 1 positions shown; findings below may reference images not displayed]

FINDINGS: Endotracheal tube and nasogastric tube have been removed. Enteric
feeding catheter tip is incompletely visualized but appears near the
pylorus. Shallow degree of inspiration. There is airspace opacity in
the right lower lung region. Lungs elsewhere are clear. Heart is
upper normal in size with pulmonary vascularity normal. No
adenopathy. No bone lesions.
IMPRESSION: Right lower lobe airspace opacity consistent with pneumonia. Lungs
elsewhere clear. Stable cardiac silhouette. Enteric tube tip near
pylorus, incompletely visualized.

## 2022-08-16 ENCOUNTER — Emergency Department (HOSPITAL_COMMUNITY)
Admission: EM | Admit: 2022-08-16 | Discharge: 2022-08-16 | Disposition: A | Discharge status: Home / Self Care | Payer: Medicaid Other | Attending: Emergency Medicine | Admitting: Emergency Medicine

## 2022-08-16 DIAGNOSIS — R569 Unspecified convulsions: Secondary | ICD-10-CM | POA: Insufficient documentation

## 2022-08-16 LAB — CBC WITH DIFFERENTIAL/PLATELET
Abs Immature Granulocytes: 0.19 10*3/uL — ABNORMAL HIGH (ref 0.00–0.07)
Basophils Absolute: 0.1 10*3/uL (ref 0.0–0.1)
Basophils Relative: 1 %
Eosinophils Absolute: 0 10*3/uL (ref 0.0–0.5)
Eosinophils Relative: 0 %
HCT: 53.2 % — ABNORMAL HIGH (ref 39.0–52.0)
Hemoglobin: 17 g/dL (ref 13.0–17.0)
Immature Granulocytes: 2 %
Lymphocytes Relative: 36 %
Lymphs Abs: 3.7 10*3/uL (ref 0.7–4.0)
MCH: 28.1 pg (ref 26.0–34.0)
MCHC: 32 g/dL (ref 30.0–36.0)
MCV: 87.8 fL (ref 80.0–100.0)
Monocytes Absolute: 0.8 10*3/uL (ref 0.1–1.0)
Monocytes Relative: 8 %
Neutro Abs: 5.6 10*3/uL (ref 1.7–7.7)
Neutrophils Relative %: 53 %
Platelets: 286 10*3/uL (ref 150–400)
RBC: 6.06 MIL/uL — ABNORMAL HIGH (ref 4.22–5.81)
RDW: 13.2 % (ref 11.5–15.5)
WBC: 10.4 10*3/uL (ref 4.0–10.5)
nRBC: 0 % (ref 0.0–0.2)

## 2022-08-16 LAB — BASIC METABOLIC PANEL
Anion gap: 27 — ABNORMAL HIGH (ref 5–15)
BUN: 9 mg/dL (ref 6–20)
CO2: 13 mmol/L — ABNORMAL LOW (ref 22–32)
Calcium: 9.8 mg/dL (ref 8.9–10.3)
Chloride: 96 mmol/L — ABNORMAL LOW (ref 98–111)
Creatinine, Ser: 1.46 mg/dL — ABNORMAL HIGH (ref 0.61–1.24)
GFR, Estimated: >60 mL/min (ref >60)
Glucose, Bld: 253 mg/dL — ABNORMAL HIGH (ref 70–99)
Potassium: 5.5 mmol/L — ABNORMAL HIGH (ref 3.5–5.1)
Sodium: 136 mmol/L (ref 135–145)

## 2022-08-16 LAB — VALPROIC ACID LEVEL: Valproic Acid Lvl: 33 ug/mL — ABNORMAL LOW (ref 50.0–100.0)

## 2022-08-16 MED ORDER — LORAZEPAM 2 MG/ML IJ SOLN
1.0000 mg | Freq: Once | INTRAMUSCULAR | Status: AC
  Administered 2022-08-16: 1 mg via INTRAVENOUS
  Filled 2022-08-16: qty 1

## 2022-08-16 MED ORDER — VALPROIC ACID 250 MG/5ML PO SOLN
500.0000 mg | Freq: Once | ORAL | Status: AC
  Administered 2022-08-16: 500 mg via ORAL
  Filled 2022-08-16: qty 10

## 2022-08-16 MED ORDER — DIVALPROEX SODIUM ER 500 MG PO TB24
500.0000 mg | ORAL_TABLET | Freq: Once | ORAL | Status: DC
  Filled 2022-08-16: qty 1

## 2022-08-16 NOTE — ED Provider Notes (Signed)
4:45 PM Assumed care of patient from off-going team. For more details, please see note from same day.  In brief, this is a 26 y.o. male with h/o seizures with seizure today after noncompliance w/ depakote. Received dose of depakote. Now is still mildly postictal, wakes up and talks but is too sleepy to leave.   Plan/Dispo at time of sign-out & ED Course since sign-out: [ ]  reeval   BP 128/64   Pulse 76   Temp 98.6 F (37 C) (Oral)   Resp 18   SpO2 99%    ED Course:   Clinical Course as of 08/16/22 2226  [facility] Aug 16, 2022  1946 Patient reevaluated. He is asking repetitive questions and is A&Ox2. Girlfriend at bedside states he is not acting normally. Patient still appears to be post-ictal. He will need more time and may possibly need to be admitted for prolonged postictal state. Patient then subsequently begins to pull at his lines and gown and make like he is going to leave the ED. Girlfriend at bedside states this has happened before and he will leave unless he receives something to help him calm down. Will dose with 1 mg IV ativan and reassess.  [HN]  2225 Patient reevaluated.  He is now ANO x 4, responding to questions appropriately.  Mother is at bedside and states he is at his baseline.  Patient instructed to take his Depakote as prescribed as it is preventing him from having seizures.  He is instructed to follow-up with his neurologist and have his labs rechecked.  Instructed to call his neurologist on Monday morning to make a follow-up appointment within 1 to 2 weeks.  Patient instructed not to drive operate heavy machinery or swim or take baths until he is seen by neurology.  Patient has mother both report understanding.  All questions answered to their satisfaction.  Discharged with discharge instructions and return precautions. [HN]    Clinical Course User Index [HN] Friday, MD   ------------------------------- Loetta Rough, MD Emergency Medicine  This note was created  using dictation software, which may contain spelling or grammatical errors.   Vivi Barrack, MD 08/16/22 2226

## 2022-08-16 NOTE — ED Notes (Signed)
Patient noted to be at 83% on room air, applied 2L [place], patient O2 did not respond, applied 15L blow by, patient O2 increased to 90%.

## 2022-08-16 NOTE — Discharge Instructions (Addendum)
Thank you for coming to [facility] Emergency Department. You were seen for . We did an exam, labs, and imaging, and these showed slightly high potassium and renal function. Please take your depakote as prescribed. Please do not drive, swim, or operate machinery until you can see neurology. Please call your neurologist on Monday morning to make an appointment within 1-2 weeks.  Do not hesitate to return to the ED or call 911 if you experience: -Worsening symptoms -Further seizure activity -Lightheadedness, passing out -Fevers/chills -Anything else that concerns you

## 2022-08-16 NOTE — ED Notes (Signed)
Pt alert and talkative upon entering room. Pt reports feeling better and ready to go home.

## 2022-08-16 NOTE — ED Provider Notes (Signed)
[facility] EMERGENCY DEPARTMENT Provider Note   CSN: [id number] Arrival date & time: 08/16/22  1230     History  Chief Complaint  Patient presents with   Seizures    Dylan Moon is a 26 y.o. male.  Patient is a 26 year old male who presents after having a seizure.  He has a known history of seizures as well as schizoaffective disorder and bipolar disorder.  Per chart review, it seems that he has a history of noncompliance with his medications which include Depakote and Aptiom.  Per EMS report, his girlfriend witnessed him to have 1 seizure.  EMS witnessed a second seizure lasting about a minute and a half.  He was given 5 mg of midazolam IM.  He has been postictal and sleepy since that incident.  He was incontinent of urine.  He did not have a fall or injury.  His girlfriend lowered him to the floor.       Home Medications Prior to Admission medications   Medication Sig Start Date End Date Taking? Authorizing Provider  acetaminophen (TYLENOL) 500 MG tablet Take 500 mg by mouth every 6 (six) hours as needed for mild pain or headache.    [provider]  ALPRAZolam Prudy Feeler) 0.25 MG tablet Take 0.25 mg by mouth daily as needed for anxiety. 06/27/20   [provider]  APTIOM 800 MG TABS Take 1,600 mg by mouth daily. 07/20/20   Lanae Boast, MD  busPIRone (BUSPAR) 10 MG tablet Take 1 tablet (10 mg total) by mouth 2 (two) times daily. 07/18/20   Lanae Boast, MD  carvedilol (COREG) 6.25 MG tablet Take 1 tablet (6.25 mg total) by mouth 2 (two) times daily with a meal. 07/20/20 09/13/21  Lanae Boast, MD  clonazePAM (KLONOPIN) 0.5 MG disintegrating tablet Take 0.5 mg by mouth daily as needed for anxiety. 12/12/20   [provider]  divalproex (DEPAKOTE ER) 500 MG 24 hr tablet Take 4 tablets (2,000 mg total) by mouth daily. 07/20/20 09/13/21  Lanae Boast, MD  escitalopram (LEXAPRO) 10 MG tablet Take 1 tablet (10 mg total) by mouth at bedtime. 07/19/20    Starkes-Perry, Juel Burrow, FNP  famotidine (PEPCID) 20 MG tablet Take 20 mg by mouth every morning. 07/17/21   [provider]  lactulose (CHRONULAC) 10 GM/15ML solution Take 15 mLs by mouth at bedtime. 05/11/21   [provider]  losartan (COZAAR) 100 MG tablet Take 1 tablet (100 mg total) by mouth daily. 07/20/20 09/13/21  Lanae Boast, MD  NAYZILAM 5 MG/0.1ML SOLN Place 5 mg into the nose daily as needed. 04/17/21   Terald Sleeper, MD  Paliperidone Palmitate ER (INVEGA TRINZA) 546 MG/1.75ML SUSY Inject 1.8 mLs (561 mg total) into the muscle every 3 (three) months. 12/11/21   Lenard Lance, FNP  promethazine (PHENERGAN) 6.25 MG/5ML syrup Take 12.5 mg by mouth every 6 (six) hours as needed for nausea/vomiting. 09/10/21   [provider]  valproic acid (DEPAKENE) 250 MG/5ML solution Take 20 mLs by mouth in the morning and at bedtime. 08/20/21   [provider]  Vitamin D, Ergocalciferol, (DRISDOL) 1.25 MG (50000 UNIT) CAPS capsule Take 50,000 Units by mouth every Monday.  01/24/20   [provider]      Allergies    Keppra [levetiracetam], Tramadol, and Vimpat [lacosamide]    Review of Systems   Review of Systems  Unable to perform ROS: Mental status change    Physical Exam Updated Vital Signs BP 128/64  Pulse 76   Temp 98.6 F (37 C) (Oral)   Resp 18   SpO2 99%  Physical Exam Constitutional:      Appearance: He is well-developed.     Comments: Sleeping, will respond to painful stimuli  HENT:     Head: Normocephalic and atraumatic.  Eyes:     Pupils: Pupils are equal, round, and reactive to light.  Cardiovascular:     Rate and Rhythm: Normal rate and regular rhythm.     Heart sounds: Normal heart sounds.  Pulmonary:     Effort: Pulmonary effort is normal. No respiratory distress.     Breath sounds: Normal breath sounds. No wheezing or rales.  Chest:     Chest wall: No tenderness.  Abdominal:     General: Bowel sounds are normal.      Palpations: Abdomen is soft.     Tenderness: There is no abdominal tenderness. There is no guarding or rebound.  Musculoskeletal:        General: Normal range of motion.     Cervical back: Normal range of motion and neck supple.  Lymphadenopathy:     Cervical: No cervical adenopathy.  Skin:    General: Skin is warm and dry.     Findings: No rash.  Neurological:     Comments: Moving all extremities symmetrically     ED Results / Procedures / Treatments   Labs (all labs ordered are listed, but only abnormal results are displayed) Labs Reviewed  BASIC METABOLIC PANEL - Abnormal; Notable for the following components:      Result Value   Potassium 5.5 (*)    Chloride 96 (*)    CO2 13 (*)    Glucose, Bld 253 (*)    Creatinine, Ser 1.46 (*)    Anion gap 27 (*)    All other components within normal limits  CBC WITH DIFFERENTIAL/PLATELET - Abnormal; Notable for the following components:   RBC 6.06 (*)    HCT 53.2 (*)    Abs Immature Granulocytes 0.19 (*)    All other components within normal limits  VALPROIC ACID LEVEL - Abnormal; Notable for the following components:   Valproic Acid Lvl 33 (*)    All other components within normal limits    EKG EKG Interpretation  Date/Time:  Friday August 16 2022 12:43:34 EST Ventricular Rate:  79 PR Interval:  154 QRS Duration: 84 QT Interval:  332 QTC Calculation: 381 R Axis:   69 Text Interpretation: Sinus arrhythmia Abnormal T, consider ischemia, diffuse leads Confirmed by Rolan Bucco [phone]) on 08/16/2022 3:09:43 PM  Radiology No results found.  Procedures Procedures    Medications Ordered in ED Medications  valproic acid (DEPAKENE) 250 MG/5ML solution 500 mg (500 mg Oral Given 08/16/22 1454)    ED Course/ Medical Decision Making/ A&P                           Medical Decision Making Amount and/or Complexity of Data Reviewed Labs: ordered.  Risk Prescription drug management.   Patient presents after having 2  witnessed seizures at home.  He was postictal on arrival.  He has been monitored for few hours and is more alert but still sleepy.  His labs are nonconcerning.  His Depakote level is low.  He was given a dose of Depakote.  Will need to continue monitoring until he is fully awake and alert.  Dr. Jearld Fenton to take over care pending revaluation.  He will wake up and is oriented.  There is no signs of head trauma.  He does then go back to sleep at this point I do not see an indication for head CT.  Final Clinical Impression(s) / ED Diagnoses Final diagnoses:  Seizure [facility])    Rx / DC Orders ED Discharge Orders     None         Rolan Bucco, MD 08/16/22 1622

## 2022-08-16 NOTE — ED Notes (Signed)
The pt is trying to  get out of bed.  His friend at the bedside reports that the pt is always like this whenever he has had seizures and med for the same  she also reports that he has pulled his iv out in the past and has walked out  before he was ready  Wynonia Hazard given has put him to sleep again

## 2022-08-16 NOTE — ED Notes (Signed)
Pt sleeping. 

## 2022-08-16 NOTE — ED Triage Notes (Signed)
Patient BIB [facility] after witnessed seizure and 2nd witnessed by EMS, patient remained unconscious between seizures. Pt airway patent and VSS other than BP 172/97, CBG 130. Patient missed last 2 doses of seizure meds, 20 LAC, received 5mg  versed en route.

## 2022-09-27 ENCOUNTER — Ambulatory Visit
Admission: EM | Admit: 2022-09-27 | Discharge: 2022-09-27 | Disposition: A | Discharge status: Home / Self Care | Payer: Medicaid Other

## 2022-09-28 ENCOUNTER — Encounter (HOSPITAL_COMMUNITY): Payer: Self-pay | Admitting: Emergency Medicine

## 2022-09-28 ENCOUNTER — Other Ambulatory Visit: Payer: Self-pay

## 2022-09-28 ENCOUNTER — Emergency Department (HOSPITAL_COMMUNITY)
Admission: EM | Admit: 2022-09-28 | Discharge: 2022-09-28 | Disposition: A | Discharge status: Home / Self Care | Payer: Medicaid Other | Attending: Emergency Medicine | Admitting: Emergency Medicine

## 2022-09-28 DIAGNOSIS — R569 Unspecified convulsions: Secondary | ICD-10-CM | POA: Insufficient documentation

## 2022-09-28 LAB — RAPID URINE DRUG SCREEN, HOSP PERFORMED
Amphetamines: NOT DETECTED
Barbiturates: NOT DETECTED
Benzodiazepines: POSITIVE — AB
Cocaine: NOT DETECTED
Opiates: NOT DETECTED
Tetrahydrocannabinol: POSITIVE — AB

## 2022-09-28 LAB — COMPREHENSIVE METABOLIC PANEL
ALT: 16 U/L (ref 0–44)
AST: 27 U/L (ref 15–41)
Albumin: 4.7 g/dL (ref 3.5–5.0)
Alkaline Phosphatase: 42 U/L (ref 38–126)
Anion gap: 13 (ref 5–15)
BUN: 7 mg/dL (ref 6–20)
CO2: 23 mmol/L (ref 22–32)
Calcium: 9.2 mg/dL (ref 8.9–10.3)
Chloride: 99 mmol/L (ref 98–111)
Creatinine, Ser: 1.25 mg/dL — ABNORMAL HIGH (ref 0.61–1.24)
GFR, Estimated: >60 mL/min (ref >60)
Glucose, Bld: 138 mg/dL — ABNORMAL HIGH (ref 70–99)
Potassium: 4 mmol/L (ref 3.5–5.1)
Sodium: 135 mmol/L (ref 135–145)
Total Bilirubin: 0.6 mg/dL (ref 0.3–1.2)
Total Protein: 7.6 g/dL (ref 6.5–8.1)

## 2022-09-28 LAB — CBC WITH DIFFERENTIAL/PLATELET
Abs Immature Granulocytes: 0.05 10*3/uL (ref 0.00–0.07)
Basophils Absolute: 0 10*3/uL (ref 0.0–0.1)
Basophils Relative: 0 %
Eosinophils Absolute: 0 10*3/uL (ref 0.0–0.5)
Eosinophils Relative: 0 %
HCT: 47.8 % (ref 39.0–52.0)
Hemoglobin: 16.1 g/dL (ref 13.0–17.0)
Immature Granulocytes: 0 %
Lymphocytes Relative: 6 %
Lymphs Abs: 0.9 10*3/uL (ref 0.7–4.0)
MCH: 28.2 pg (ref 26.0–34.0)
MCHC: 33.7 g/dL (ref 30.0–36.0)
MCV: 83.9 fL (ref 80.0–100.0)
Monocytes Absolute: 0.7 10*3/uL (ref 0.1–1.0)
Monocytes Relative: 4 %
Neutro Abs: 14.8 10*3/uL — ABNORMAL HIGH (ref 1.7–7.7)
Neutrophils Relative %: 90 %
Platelets: 284 10*3/uL (ref 150–400)
RBC: 5.7 MIL/uL (ref 4.22–5.81)
RDW: 13.2 % (ref 11.5–15.5)
WBC: 16.5 10*3/uL — ABNORMAL HIGH (ref 4.0–10.5)
nRBC: 0 % (ref 0.0–0.2)

## 2022-09-28 LAB — ETHANOL: Alcohol, Ethyl (B): <10 mg/dL

## 2022-09-28 LAB — VALPROIC ACID LEVEL: Valproic Acid Lvl: 26 ug/mL — ABNORMAL LOW (ref 50.0–100.0)

## 2022-09-28 MED ORDER — VALPROIC ACID 250 MG/5ML PO SOLN
1000.0000 mg | Freq: Two times a day (BID) | ORAL | Status: DC
  Administered 2022-09-28: 1000 mg via ORAL
  Filled 2022-09-28 (×2): qty 20

## 2022-09-28 MED ORDER — ONDANSETRON HCL 4 MG/2ML IJ SOLN
4.0000 mg | Freq: Once | INTRAMUSCULAR | Status: AC
  Administered 2022-09-28: 4 mg via INTRAVENOUS
  Filled 2022-09-28: qty 2

## 2022-09-28 MED ORDER — SODIUM CHLORIDE 0.9 % IV BOLUS
1000.0000 mL | Freq: Once | INTRAVENOUS | Status: AC
  Administered 2022-09-28: 1000 mL via INTRAVENOUS

## 2022-09-28 MED ORDER — ESLICARBAZEPINE ACETATE 800 MG PO TABS: 1600.0000 mg | ORAL_TABLET | Freq: Every day | ORAL | Status: DC

## 2022-09-28 NOTE — ED Provider Notes (Signed)
Care transferred to me.  Patient is awake and alert and back to baseline according to girlfriend.  He was given his home Depakote, but we don't have his other medicine in the hospital. Girlfriend will give it to him at home.  No further seizure-like activity.  Appears stable for discharge home and he was encouraged to be more compliant with his meds.   Sherwood Gambler, MD 09/28/22 [phone]

## 2022-09-28 NOTE — Discharge Instructions (Addendum)
Make sure to take your medications regularly.  Follow-up with your primary care doctor and your neurologist.  - According to [place] law, you can not drive unless you are seizure / syncope free for at least 6 months and under physician's care.    - Please maintain precautions. Do not participate in activities where a loss of awareness could harm you or someone else. No swimming alone, no tub bathing, no hot tubs, no driving, no operating motorized vehicles (cars, ATVs, motocycles, etc), lawnmowers, power tools or firearms. No standing at heights, such as rooftops, ladders or stairs. Avoid hot objects such as stoves, heaters, open fires. Wear a helmet when riding a bicycle, scooter, skateboard, etc. and avoid areas of traffic. Set your water heater to 120 degrees or less.

## 2022-09-28 NOTE — ED Notes (Signed)
Pt refused d/c vitals.

## 2022-09-28 NOTE — ED Provider Notes (Signed)
[place] EMERGENCY DEPARTMENT Provider Note   CSN: [id number] Arrival date & time: 09/28/22  1254     History  Chief Complaint  Patient presents with   Seizures    Dylan Moon is a 27 y.o. male.   Seizures    Patient has history of seizure disorder who presents to the ED for acute seizures.  Per EMS report patient was combative and postictal on arrival.  Family reported seizure this morning.  Patient not answering questions consistently at this time.  Home Medications Prior to Admission medications   Medication Sig Start Date End Date Taking? Authorizing Provider  acetaminophen (TYLENOL) 500 MG tablet Take 500 mg by mouth every 6 (six) hours as needed for mild pain or headache.    [provider]  ALPRAZolam Duanne Moron) 0.25 MG tablet Take 0.25 mg by mouth daily as needed for anxiety. 06/27/20   [provider]  APTIOM 800 MG TABS Take 1,600 mg by mouth daily. 07/20/20   Antonieta Pert, MD  busPIRone (BUSPAR) 10 MG tablet Take 1 tablet (10 mg total) by mouth 2 (two) times daily. 07/18/20   Antonieta Pert, MD  carvedilol (COREG) 6.25 MG tablet Take 1 tablet (6.25 mg total) by mouth 2 (two) times daily with a meal. 07/20/20 09/13/21  Antonieta Pert, MD  clonazePAM (KLONOPIN) 0.5 MG disintegrating tablet Take 0.5 mg by mouth daily as needed for anxiety. 12/12/20   [provider]  divalproex (DEPAKOTE ER) 500 MG 24 hr tablet Take 4 tablets (2,000 mg total) by mouth daily. 07/20/20 09/13/21  Antonieta Pert, MD  escitalopram (LEXAPRO) 10 MG tablet Take 1 tablet (10 mg total) by mouth at bedtime. 07/19/20   Starkes-Perry, Gayland Curry, FNP  famotidine (PEPCID) 20 MG tablet Take 20 mg by mouth every morning. 07/17/21   [provider]  lactulose (CHRONULAC) 10 GM/15ML solution Take 15 mLs by mouth at bedtime. 05/11/21   [provider]  losartan (COZAAR) 100 MG tablet Take 1 tablet (100 mg total) by mouth daily. 07/20/20 09/13/21  Antonieta Pert, MD   NAYZILAM 5 MG/0.1ML SOLN Place 5 mg into the nose daily as needed. 04/17/21   Wyvonnia Dusky, MD  Paliperidone Palmitate ER (INVEGA TRINZA) 546 MG/1.75ML SUSY Inject 1.8 mLs (561 mg total) into the muscle every 3 (three) months. 12/11/21   Lucky Rathke, FNP  promethazine (PHENERGAN) 6.25 MG/5ML syrup Take 12.5 mg by mouth every 6 (six) hours as needed for nausea/vomiting. 09/10/21   [provider]  valproic acid (DEPAKENE) 250 MG/5ML solution Take 20 mLs by mouth in the morning and at bedtime. 08/20/21   [provider]  Vitamin D, Ergocalciferol, (DRISDOL) 1.25 MG (50000 UNIT) CAPS capsule Take 50,000 Units by mouth every Monday.  01/24/20   [provider]      Allergies    Keppra [levetiracetam], Tramadol, and Vimpat [lacosamide]    Review of Systems   Review of Systems  Neurological:  Positive for seizures.    Physical Exam Updated Vital Signs BP 135/60   Pulse 81   Temp 98.2 F (36.8 C) (Axillary)   SpO2 96%  Physical Exam Vitals and nursing note reviewed.  Constitutional:      Appearance: He is well-developed. He is not diaphoretic.  HENT:     Head: Normocephalic and atraumatic.     Right Ear: External ear normal.     Left Ear: External ear normal.  Eyes:     General: No scleral icterus.  Right eye: No discharge.        Left eye: No discharge.     Conjunctiva/sclera: Conjunctivae normal.  Neck:     Trachea: No tracheal deviation.  Cardiovascular:     Rate and Rhythm: Normal rate and regular rhythm.  Pulmonary:     Effort: Pulmonary effort is normal. No respiratory distress.     Breath sounds: Normal breath sounds. No stridor. No wheezing or rales.  Abdominal:     General: Bowel sounds are normal. There is no distension.     Palpations: Abdomen is soft.     Tenderness: There is no abdominal tenderness. There is no guarding or rebound.  Musculoskeletal:        General: No tenderness or deformity.     Cervical back: Neck supple.   Skin:    General: Skin is warm and dry.     Findings: No rash.  Neurological:     General: No focal deficit present.     Mental Status: He is alert.     Cranial Nerves: No cranial nerve deficit, dysarthria or facial asymmetry.     Sensory: No sensory deficit.     Motor: No abnormal muscle tone or seizure activity.     Coordination: Coordination normal.     Comments: Patient rolling around in the bed, not consistently answering questions but does verbally  Psychiatric:        Mood and Affect: Mood normal.     ED Results / Procedures / Treatments   Labs (all labs ordered are listed, but only abnormal results are displayed) Labs Reviewed  COMPREHENSIVE METABOLIC PANEL - Abnormal; Notable for the following components:      Result Value   Glucose, Bld 138 (*)    Creatinine, Ser 1.25 (*)    All other components within normal limits  CBC WITH DIFFERENTIAL/PLATELET - Abnormal; Notable for the following components:   WBC 16.5 (*)    Neutro Abs 14.8 (*)    All other components within normal limits  VALPROIC ACID LEVEL - Abnormal; Notable for the following components:   Valproic Acid Lvl 26 (*)    All other components within normal limits  ETHANOL  RAPID URINE DRUG SCREEN, HOSP PERFORMED  CBG MONITORING, ED    EKG None  Radiology No results found.  Procedures Procedures    Medications Ordered in ED Medications  Eslicarbazepine Acetate TABS 1,600 mg (has no administration in time range)  valproic acid (DEPAKENE) 250 MG/5ML solution 1,000 mg (has no administration in time range)  ondansetron (ZOFRAN) injection 4 mg (4 mg Intravenous Given 09/28/22 1508)  sodium chloride 0.9 % bolus 1,000 mL (1,000 mLs Intravenous New Bag/Given 09/28/22 1508)    ED Course/ Medical Decision Making/ A&P Clinical Course as of 09/28/22 1534  Sat Sep 28, 2022  1432 History provided by patient's girlfriend.  Patient may have missed some of his doses recently.  He did not sleep well last night.   She did witness the seizures today.  Patient is more awake.  He did speak with his girlfriend.  Has returned to sleep but does appear to be improving [JK]  1533 Else discussed with patient's mother as well as his significant other.  Patient remained stable.  Will continue to monitor until he is more alert and post ictal phase has resolved completely [JK]    Clinical Course User Index [JK] Linwood Dibbles, MD  Medical Decision Making Problems Addressed: Seizure [facility]): acute illness or injury that poses a threat to life or bodily functions  Amount and/or Complexity of Data Reviewed Labs: ordered. Decision-making details documented in ED Course.  Risk Prescription drug management.   Presented to the ED for evaluation of seizures.  Patient has history of noncompliance and his significant other confirmed he had missed some doses recently.  Patient did not have any recurrent seizures in the ED.  Doses of his medications ordered.  Will continue to monitor till patient is alert and safe for discharge and he does not have any recurrent seizures.  Case turned over to Dr. Gwenlyn Fudge at shift change        Final Clinical Impression(s) / ED Diagnoses Final diagnoses:  Seizure [facility])    Rx / DC Orders ED Discharge Orders     None         Linwood Dibbles, MD 09/28/22 1534

## 2022-09-28 NOTE — ED Triage Notes (Signed)
Pt BIB [facility] with reports of seizure that was witnessed by family. Ems reports pt was combative and post ictal upon arrival. Pt received 5mg  versed IM en route. Pt is not cooperative at this time. Pt rolling from side to side in the bed and pulling at monitoring cables.

## 2022-09-28 NOTE — ED Notes (Signed)
Pt continuing to pull EKG leads, gown, O2 sensor, and BP cuff off and roll back and fourth in the bed.

## 2022-10-02 ENCOUNTER — Other Ambulatory Visit (HOSPITAL_COMMUNITY): Payer: Self-pay | Admitting: Physician Assistant
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
## 2022-11-19 ENCOUNTER — Encounter (HOSPITAL_COMMUNITY): Payer: Self-pay

## 2022-11-19 ENCOUNTER — Other Ambulatory Visit: Payer: Self-pay

## 2022-11-19 ENCOUNTER — Emergency Department (HOSPITAL_COMMUNITY)
Admission: EM | Admit: 2022-11-19 | Discharge: 2022-11-19 | Discharge status: Against Medical Advice | Payer: Medicaid Other | Attending: Emergency Medicine | Admitting: Emergency Medicine

## 2022-11-19 DIAGNOSIS — Z5321 Procedure and treatment not carried out due to patient leaving prior to being seen by health care provider: Secondary | ICD-10-CM | POA: Insufficient documentation

## 2022-11-19 DIAGNOSIS — R569 Unspecified convulsions: Secondary | ICD-10-CM | POA: Diagnosis present

## 2022-11-19 LAB — COMPREHENSIVE METABOLIC PANEL
ALT: 15 U/L (ref 0–44)
AST: 25 U/L (ref 15–41)
Albumin: 4.6 g/dL (ref 3.5–5.0)
Alkaline Phosphatase: 41 U/L (ref 38–126)
Anion gap: 11 (ref 5–15)
BUN: 10 mg/dL (ref 6–20)
CO2: 23 mmol/L (ref 22–32)
Calcium: 9.8 mg/dL (ref 8.9–10.3)
Chloride: 101 mmol/L (ref 98–111)
Creatinine, Ser: 1.08 mg/dL (ref 0.61–1.24)
GFR, Estimated: >60 mL/min (ref >60)
Glucose, Bld: 98 mg/dL (ref 70–99)
Potassium: 4.4 mmol/L (ref 3.5–5.1)
Sodium: 135 mmol/L (ref 135–145)
Total Bilirubin: 0.6 mg/dL (ref 0.3–1.2)
Total Protein: 7.5 g/dL (ref 6.5–8.1)

## 2022-11-19 LAB — CBC WITH DIFFERENTIAL/PLATELET
Abs Immature Granulocytes: 0.02 10*3/uL (ref 0.00–0.07)
Basophils Absolute: 0 10*3/uL (ref 0.0–0.1)
Basophils Relative: 0 %
Eosinophils Absolute: 0 10*3/uL (ref 0.0–0.5)
Eosinophils Relative: 0 %
HCT: 47.6 % (ref 39.0–52.0)
Hemoglobin: 16.1 g/dL (ref 13.0–17.0)
Immature Granulocytes: 0 %
Lymphocytes Relative: 25 %
Lymphs Abs: 1.3 10*3/uL (ref 0.7–4.0)
MCH: 28.2 pg (ref 26.0–34.0)
MCHC: 33.8 g/dL (ref 30.0–36.0)
MCV: 83.4 fL (ref 80.0–100.0)
Monocytes Absolute: 0.4 10*3/uL (ref 0.1–1.0)
Monocytes Relative: 8 %
Neutro Abs: 3.5 10*3/uL (ref 1.7–7.7)
Neutrophils Relative %: 67 %
Platelets: 257 10*3/uL (ref 150–400)
RBC: 5.71 MIL/uL (ref 4.22–5.81)
RDW: 13.3 % (ref 11.5–15.5)
WBC: 5.3 10*3/uL (ref 4.0–10.5)
nRBC: 0 % (ref 0.0–0.2)

## 2022-11-19 LAB — VALPROIC ACID LEVEL: Valproic Acid Lvl: 30 ug/mL — ABNORMAL LOW (ref 50.0–100.0)

## 2022-11-19 LAB — CBG MONITORING, ED: Glucose-Capillary: 109 mg/dL — ABNORMAL HIGH (ref 70–99)

## 2022-11-19 NOTE — ED Provider Triage Note (Cosign Needed Addendum)
Emergency Medicine Provider Triage Evaluation Note  Dylan Moon , a 27 y.o. male  was evaluated in triage.  Hx of seizures and medication noncompliance.  Pt complains of seizure at home while he was sleeping.  Occurred while lying in bed.  Did not fall of bed, or hit head.  Lasted about 1.5 minutes according to family member.  States it feels similar though somewhat different to prior seizures, unable to elaborate further.  States last time he took his Keppra was last night.  Denies shortness of breath, chest pain, headache, vision changes, fevers, or stiff neck.  Denies head or neck pain.  Review of Systems  Positive:  Negative: See above  Physical Exam  BP (!) 139/92   Pulse 88   Temp 98.2 F (36.8 C) (Oral)   Resp 18   Ht '5\' 11"'$  (1.803 m)   Wt 96 kg   SpO2 98% Comment: Simultaneous filing. User may not have seen previous data.  BMI 29.52 kg/m  Gen:   Awake, no distress   Resp:  Normal effort  MSK:   Moves extremities without difficulty  Other:  Head appears atraumatic.  Gaze aligned appropriately.  AAOx4.  Not diaphoretic.  Follows 1-2 step commands without difficulty.  Answers questions appropriately.  GCS 15.  Sitting comfortably.  Medical Decision Making  Medically screening exam initiated at 11:14 AM.  Appropriate orders placed.  Dylan Moon was informed that the remainder of the evaluation will be completed by another provider, this initial triage assessment does not replace that evaluation, and the importance of remaining in the ED until their evaluation is complete.       [name], PA-C [id number] [id number]

## 2022-11-19 NOTE — ED Triage Notes (Signed)
Pt arrived via [facility] from home. [name] girlfriend said pt had a tonic clonic seizure lasted 1.5 mins. Pt non compliant with valproic acid. Per EMS, initial post tictal, tachpenic, tachycardic, pt urinated on self. Initial GCS 10. Pt is now A&Ox4. GCS 15.

## 2022-11-26 ENCOUNTER — Inpatient Hospital Stay (HOSPITAL_COMMUNITY)
Admission: EM | Admit: 2022-11-26 | Discharge: 2022-12-08 | DRG: 101 | Disposition: A | Discharge status: Home / Self Care | Payer: Medicaid Other | Attending: Pulmonary Disease | Admitting: Pulmonary Disease

## 2022-11-26 ENCOUNTER — Other Ambulatory Visit (HOSPITAL_COMMUNITY): Payer: Medicaid Other

## 2022-11-26 DIAGNOSIS — Z8249 Family history of ischemic heart disease and other diseases of the circulatory system: Secondary | ICD-10-CM

## 2022-11-26 DIAGNOSIS — F600 Paranoid personality disorder: Secondary | ICD-10-CM | POA: Diagnosis present

## 2022-11-26 DIAGNOSIS — Z91148 Patient's other noncompliance with medication regimen for other reason: Secondary | ICD-10-CM

## 2022-11-26 DIAGNOSIS — T1491XA Suicide attempt, initial encounter: Secondary | ICD-10-CM | POA: Insufficient documentation

## 2022-11-26 DIAGNOSIS — R471 Dysarthria and anarthria: Secondary | ICD-10-CM | POA: Diagnosis not present

## 2022-11-26 DIAGNOSIS — F4325 Adjustment disorder with mixed disturbance of emotions and conduct: Secondary | ICD-10-CM | POA: Diagnosis present

## 2022-11-26 DIAGNOSIS — Z885 Allergy status to narcotic agent status: Secondary | ICD-10-CM

## 2022-11-26 DIAGNOSIS — F4310 Post-traumatic stress disorder, unspecified: Secondary | ICD-10-CM | POA: Diagnosis present

## 2022-11-26 DIAGNOSIS — Z823 Family history of stroke: Secondary | ICD-10-CM

## 2022-11-26 DIAGNOSIS — Z888 Allergy status to other drugs, medicaments and biological substances status: Secondary | ICD-10-CM

## 2022-11-26 DIAGNOSIS — R34 Anuria and oliguria: Secondary | ICD-10-CM | POA: Diagnosis not present

## 2022-11-26 DIAGNOSIS — R569 Unspecified convulsions: Secondary | ICD-10-CM

## 2022-11-26 DIAGNOSIS — E876 Hypokalemia: Secondary | ICD-10-CM | POA: Diagnosis present

## 2022-11-26 DIAGNOSIS — W134XXA Fall from, out of or through window, initial encounter: Secondary | ICD-10-CM | POA: Diagnosis present

## 2022-11-26 DIAGNOSIS — F259 Schizoaffective disorder, unspecified: Secondary | ICD-10-CM | POA: Diagnosis present

## 2022-11-26 DIAGNOSIS — M419 Scoliosis, unspecified: Secondary | ICD-10-CM | POA: Diagnosis present

## 2022-11-26 DIAGNOSIS — G40A09 Absence epileptic syndrome, not intractable, without status epilepticus: Principal | ICD-10-CM | POA: Diagnosis present

## 2022-11-26 DIAGNOSIS — Z781 Physical restraint status: Secondary | ICD-10-CM

## 2022-11-26 DIAGNOSIS — R32 Unspecified urinary incontinence: Secondary | ICD-10-CM | POA: Diagnosis present

## 2022-11-26 DIAGNOSIS — Z79899 Other long term (current) drug therapy: Secondary | ICD-10-CM

## 2022-11-26 DIAGNOSIS — Z833 Family history of diabetes mellitus: Secondary | ICD-10-CM

## 2022-11-26 DIAGNOSIS — Z9682 Presence of neurostimulator: Secondary | ICD-10-CM

## 2022-11-26 DIAGNOSIS — F1290 Cannabis use, unspecified, uncomplicated: Secondary | ICD-10-CM | POA: Diagnosis present

## 2022-11-26 DIAGNOSIS — R45851 Suicidal ideations: Secondary | ICD-10-CM | POA: Diagnosis present

## 2022-11-26 DIAGNOSIS — Z91128 Patient's intentional underdosing of medication regimen for other reason: Secondary | ICD-10-CM

## 2022-11-26 DIAGNOSIS — Y92009 Unspecified place in unspecified non-institutional (private) residence as the place of occurrence of the external cause: Secondary | ICD-10-CM

## 2022-11-26 DIAGNOSIS — F319 Bipolar disorder, unspecified: Secondary | ICD-10-CM | POA: Diagnosis present

## 2022-11-26 DIAGNOSIS — R451 Restlessness and agitation: Secondary | ICD-10-CM | POA: Diagnosis present

## 2022-11-26 DIAGNOSIS — B370 Candidal stomatitis: Secondary | ICD-10-CM | POA: Diagnosis present

## 2022-11-26 DIAGNOSIS — R4689 Other symptoms and signs involving appearance and behavior: Secondary | ICD-10-CM | POA: Diagnosis present

## 2022-11-26 DIAGNOSIS — F17290 Nicotine dependence, other tobacco product, uncomplicated: Secondary | ICD-10-CM | POA: Diagnosis present

## 2022-11-26 DIAGNOSIS — Y9339 Activity, other involving climbing, rappelling and jumping off: Secondary | ICD-10-CM

## 2022-11-26 DIAGNOSIS — G40909 Epilepsy, unspecified, not intractable, without status epilepticus: Principal | ICD-10-CM

## 2022-11-26 DIAGNOSIS — T50996A Underdosing of other drugs, medicaments and biological substances, initial encounter: Secondary | ICD-10-CM | POA: Diagnosis present

## 2022-11-26 DIAGNOSIS — N179 Acute kidney failure, unspecified: Secondary | ICD-10-CM | POA: Diagnosis present

## 2022-11-26 DIAGNOSIS — K047 Periapical abscess without sinus: Secondary | ICD-10-CM | POA: Diagnosis present

## 2022-11-26 DIAGNOSIS — F79 Unspecified intellectual disabilities: Secondary | ICD-10-CM | POA: Diagnosis present

## 2022-11-26 DIAGNOSIS — K567 Ileus, unspecified: Secondary | ICD-10-CM | POA: Diagnosis not present

## 2022-11-26 DIAGNOSIS — F909 Attention-deficit hyperactivity disorder, unspecified type: Secondary | ICD-10-CM | POA: Diagnosis present

## 2022-11-26 DIAGNOSIS — E871 Hypo-osmolality and hyponatremia: Secondary | ICD-10-CM | POA: Diagnosis present

## 2022-11-26 DIAGNOSIS — R410 Disorientation, unspecified: Secondary | ICD-10-CM | POA: Diagnosis present

## 2022-11-26 DIAGNOSIS — Z72820 Sleep deprivation: Secondary | ICD-10-CM

## 2022-11-26 LAB — HIV ANTIBODY (ROUTINE TESTING W REFLEX): HIV Screen 4th Generation wRfx: NONREACTIVE

## 2022-11-26 LAB — COMPREHENSIVE METABOLIC PANEL
ALT: 22 U/L (ref 0–44)
AST: 41 U/L (ref 15–41)
Albumin: 4.5 g/dL (ref 3.5–5.0)
Alkaline Phosphatase: 40 U/L (ref 38–126)
Anion gap: 12 (ref 5–15)
BUN: 6 mg/dL (ref 6–20)
CO2: 22 mmol/L (ref 22–32)
Calcium: 9.8 mg/dL (ref 8.9–10.3)
Chloride: 103 mmol/L (ref 98–111)
Creatinine, Ser: 1.2 mg/dL (ref 0.61–1.24)
GFR, Estimated: >60 mL/min (ref >60)
Glucose, Bld: 83 mg/dL (ref 70–99)
Potassium: 5 mmol/L (ref 3.5–5.1)
Sodium: 137 mmol/L (ref 135–145)
Total Bilirubin: 0.6 mg/dL (ref 0.3–1.2)
Total Protein: 7.3 g/dL (ref 6.5–8.1)

## 2022-11-26 LAB — ETHANOL: Alcohol, Ethyl (B): <10 mg/dL (ref <10)

## 2022-11-26 LAB — CBC
HCT: 47 % (ref 39.0–52.0)
Hemoglobin: 15.5 g/dL (ref 13.0–17.0)
MCH: 28 pg (ref 26.0–34.0)
MCHC: 33 g/dL (ref 30.0–36.0)
MCV: 84.8 fL (ref 80.0–100.0)
Platelets: 250 10*3/uL (ref 150–400)
RBC: 5.54 MIL/uL (ref 4.22–5.81)
RDW: 13.2 % (ref 11.5–15.5)
WBC: 11.6 10*3/uL — ABNORMAL HIGH (ref 4.0–10.5)
nRBC: 0 % (ref 0.0–0.2)

## 2022-11-26 LAB — VALPROIC ACID LEVEL: Valproic Acid Lvl: 85 ug/mL (ref 50.0–100.0)

## 2022-11-26 LAB — CBG MONITORING, ED: Glucose-Capillary: 88 mg/dL (ref 70–99)

## 2022-11-26 LAB — MAGNESIUM: Magnesium: 2.2 mg/dL (ref 1.7–2.4)

## 2022-11-26 MED ORDER — MIDAZOLAM HCL 2 MG/2ML IJ SOLN
INTRAMUSCULAR | Status: AC
  Administered 2022-11-26: 2 mg
  Filled 2022-11-26: qty 2

## 2022-11-26 MED ORDER — VALPROATE SODIUM 100 MG/ML IV SOLN
1000.0000 mg | Freq: Once | INTRAVENOUS | Status: AC
  Administered 2022-11-26: 1000 mg via INTRAVENOUS
  Filled 2022-11-26: qty 10

## 2022-11-26 MED ORDER — OLANZAPINE 10 MG IM SOLR
5.0000 mg | Freq: Once | INTRAMUSCULAR | Status: DC
  Filled 2022-11-26: qty 10

## 2022-11-26 MED ORDER — PROCHLORPERAZINE MALEATE 5 MG PO TABS
5.0000 mg | ORAL_TABLET | Freq: Once | ORAL | Status: AC
  Administered 2022-11-26: 5 mg via ORAL
  Filled 2022-11-26: qty 1

## 2022-11-26 MED ORDER — MIDAZOLAM HCL 2 MG/2ML IJ SOLN: 2.0000 mg | Freq: Once | INTRAMUSCULAR | Status: DC

## 2022-11-26 MED ORDER — DROPERIDOL 2.5 MG/ML IJ SOLN
2.5000 mg | Freq: Once | INTRAMUSCULAR | Status: AC
  Administered 2022-11-26: 2.5 mg via INTRAVENOUS

## 2022-11-26 MED ORDER — LORAZEPAM 2 MG/ML IJ SOLN
2.0000 mg | Freq: Once | INTRAMUSCULAR | Status: AC
  Administered 2022-11-26: 2 mg via INTRAVENOUS
  Filled 2022-11-26: qty 1

## 2022-11-26 MED ORDER — SODIUM CHLORIDE 0.9 % IV SOLN
3000.0000 mg | Freq: Once | INTRAVENOUS | Status: AC
  Administered 2022-11-26: 3000 mg via INTRAVENOUS
  Filled 2022-11-26: qty 30

## 2022-11-26 MED ORDER — MIDAZOLAM HCL 2 MG/2ML IJ SOLN
2.0000 mg | INTRAMUSCULAR | Status: DC | PRN
  Administered 2022-11-26 – 2022-11-27 (×7): 2 mg via INTRAVENOUS
  Filled 2022-11-26 (×7): qty 2

## 2022-11-26 MED ORDER — OLANZAPINE 10 MG IM SOLR
5.0000 mg | Freq: Three times a day (TID) | INTRAMUSCULAR | Status: AC | PRN
  Administered 2022-11-27: 5 mg via INTRAVENOUS

## 2022-11-26 MED ORDER — LORAZEPAM 2 MG/ML IJ SOLN
4.0000 mg | INTRAMUSCULAR | Status: DC | PRN
  Filled 2022-11-26 (×2): qty 2

## 2022-11-26 MED ORDER — LORAZEPAM 2 MG/ML IJ SOLN
2.0000 mg | Freq: Once | INTRAMUSCULAR | Status: AC
  Administered 2022-11-26: 2 mg via INTRAVENOUS

## 2022-11-26 MED ORDER — ENOXAPARIN SODIUM 40 MG/0.4ML IJ SOSY: 40.0000 mg | PREFILLED_SYRINGE | INTRAMUSCULAR | Status: DC

## 2022-11-26 MED ORDER — MIDAZOLAM HCL 2 MG/2ML IJ SOLN
2.0000 mg | Freq: Once | INTRAMUSCULAR | Status: AC
  Administered 2022-11-26: 2 mg via INTRAVENOUS
  Filled 2022-11-26: qty 2

## 2022-11-26 MED ORDER — MIDAZOLAM HCL 2 MG/2ML IJ SOLN: 2.0000 mg | Freq: Once | INTRAMUSCULAR | Status: AC

## 2022-11-26 MED ORDER — HALOPERIDOL LACTATE 5 MG/ML IJ SOLN
5.0000 mg | Freq: Once | INTRAMUSCULAR | Status: AC
  Administered 2022-11-26: 5 mg via INTRAVENOUS
  Filled 2022-11-26: qty 1

## 2022-11-26 MED ORDER — LORAZEPAM 2 MG/ML IJ SOLN
INTRAMUSCULAR | Status: AC
  Administered 2022-11-26: 2 mg via INTRAVENOUS
  Filled 2022-11-26: qty 1

## 2022-11-26 MED ORDER — ESLICARBAZEPINE ACETATE 800 MG PO TABS: 1600.0000 mg | ORAL_TABLET | Freq: Every day | ORAL | Status: DC

## 2022-11-26 MED ORDER — OLANZAPINE 10 MG IM SOLR
5.0000 mg | Freq: Three times a day (TID) | INTRAMUSCULAR | Status: AC | PRN
  Filled 2022-11-26: qty 10
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
## 2022-11-26 NOTE — Progress Notes (Signed)
FMTS Interim Progress Note  Notified by nurse that patient's mother is at bedside. Went to bedside to speak with them, both mother and girlfriend present. Mother reports that he has had a history of seizure disorder for the past 5 years and confirms that he follows up with neurology outpatient at [place] every 2 months. His last seizure prior to today was last week and prior to this was about a month ago. He seems to be getting a seizure at least once every 1-2 months. Lives with girlfriend who reports that she was not present for the first seizure but was present for the second seizure when he seemed to have some jerking movements but not as much as his typical seizures and also was staring into space. This lasted about 1 minute. She confirms urinary incontinence. EMS tried to get him to come to the hospital, but both mother and girlfriend believe that he was very anxious and in efforts to try to escape the situation, he jumped out of the window from their 2nd floor apartment. Girlfriend then witnessed 2 more seizures after coming to the hospital, both of these appeared more like absence seizures with only staring and no jerking movements. Both of these episodes lasted for less than 5 minutes. They are unsure if he takes his medications daily. They do not believe he has expressed SI in the past or currently, they think he is very anxious and this is what causes this. Mother does recall him making comments in the past such as "I would like to drive off of the road." I provided an update to both of them and that we will be monitoring his seizures with assistance from the neurology team. They are aware that he is currently under IVC. Girlfriend is fine with being emergency contact per patient preference. I informed them that night team will be monitoring closely as well.   Girlfriend contact information: Wadie Lessen [phone]  Donney Dice, DO 11/26/2022, 6:10 PM PGY-3, [place] [facility] pager  [phone]

## 2022-11-26 NOTE — ED Provider Notes (Signed)
[facility] Provider Note   CSN: [id number] Arrival date & time: 11/26/22  1130     History {Add pertinent medical, surgical, social history, OB history to HPI:1} Chief Complaint  Patient presents with  . Seizures    Dylan Moon is a 27 y.o. male.   Seizures      Home Medications Prior to Admission medications   Medication Sig Start Date End Date Taking? Authorizing Provider  acetaminophen (TYLENOL) 500 MG tablet Take 500 mg by mouth every 6 (six) hours as needed for mild pain or headache.    [provider]  ALPRAZolam Duanne Moron) 0.25 MG tablet Take 0.25 mg by mouth daily as needed for anxiety. 06/27/20   [provider]  APTIOM 800 MG TABS Take 1,600 mg by mouth daily. 07/20/20   Antonieta Pert, MD  busPIRone (BUSPAR) 10 MG tablet Take 1 tablet (10 mg total) by mouth 2 (two) times daily. 07/18/20   Antonieta Pert, MD  carvedilol (COREG) 6.25 MG tablet Take 1 tablet (6.25 mg total) by mouth 2 (two) times daily with a meal. 07/20/20 09/13/21  Antonieta Pert, MD  clonazePAM (KLONOPIN) 0.5 MG disintegrating tablet Take 0.5 mg by mouth daily as needed for anxiety. 12/12/20   [provider]  divalproex (DEPAKOTE ER) 500 MG 24 hr tablet Take 4 tablets (2,000 mg total) by mouth daily. 07/20/20 09/13/21  Antonieta Pert, MD  escitalopram (LEXAPRO) 10 MG tablet Take 1 tablet (10 mg total) by mouth at bedtime. 07/19/20   Starkes-Perry, Gayland Curry, FNP  famotidine (PEPCID) 20 MG tablet Take 20 mg by mouth every morning. 07/17/21   [provider]  lactulose (CHRONULAC) 10 GM/15ML solution Take 15 mLs by mouth at bedtime. 05/11/21   [provider]  losartan (COZAAR) 100 MG tablet Take 1 tablet (100 mg total) by mouth daily. 07/20/20 09/13/21  Antonieta Pert, MD  NAYZILAM 5 MG/0.1ML SOLN Place 5 mg into the nose daily as needed. 04/17/21   Wyvonnia Dusky, MD  Paliperidone Palmitate ER (INVEGA TRINZA) 546 MG/1.75ML SUSY  Inject 1.8 mLs (561 mg total) into the muscle every 3 (three) months. 12/11/21   Lucky Rathke, FNP  promethazine (PHENERGAN) 6.25 MG/5ML syrup Take 12.5 mg by mouth every 6 (six) hours as needed for nausea/vomiting. 09/10/21   [provider]  valproic acid (DEPAKENE) 250 MG/5ML solution Take 20 mLs by mouth in the morning and at bedtime. 08/20/21   [provider]  Vitamin D, Ergocalciferol, (DRISDOL) 1.25 MG (50000 UNIT) CAPS capsule Take 50,000 Units by mouth every Monday.  01/24/20   [provider]      Allergies    Keppra [levetiracetam], Tramadol, and Vimpat [lacosamide]    Review of Systems   Review of Systems  Neurological:  Positive for seizures.    Physical Exam Updated Vital Signs BP (!) 152/91   Pulse 77   SpO2 96%  Physical Exam  ED Results / Procedures / Treatments   Labs (all labs ordered are listed, but only abnormal results are displayed) Labs Reviewed  VALPROIC ACID LEVEL  BASIC METABOLIC PANEL  CBC  CBG MONITORING, ED    EKG None  Radiology No results found.  Procedures Procedures  {Document cardiac monitor, telemetry assessment procedure when appropriate:1}  Medications Ordered in ED Medications - No data to display  ED Course/ Medical Decision Making/ A&P   {   Click here for ABCD2, HEART and other calculatorsREFRESH Note before signing :  1}                          Medical Decision Making Amount and/or Complexity of Data Reviewed Labs: ordered.   ***  {Document critical care time when appropriate:1} {Document review of labs and clinical decision tools ie heart score, Chads2Vasc2 etc:1}  {Document your independent review of radiology images, and any outside records:1} {Document your discussion with family members, caretakers, and with consultants:1} {Document social determinants of health affecting pt's care:1} {Document your decision making why or why not admission, treatments were needed:1} Final Clinical  Impression(s) / ED Diagnoses Final diagnoses:  None    Rx / DC Orders ED Discharge Orders     None

## 2022-11-26 NOTE — Progress Notes (Signed)
Pharmacy consulted to specify last fill records.   Fill records are as below: Buspirone '10mg'$  BIDprn - last filled 11/19 (30 day supply) Escitalopram '10mg'$  at bedtime - last filled 10/02/22 (30 day supply) Eslicarbazepine (aptiom) '1600mg'$  daily - last filled 11/04/22 (30 day supply) Nayzilam '5mg'$ /0.65m solution - '5mg'$  into nose daily as needed - last filled 11/04/22 Depakene '250mg'$ /542m- take 20 ml BID - last filled 11/04/22 (30 day supply)  Pt only reports taking the Aptiom and Depakene - states he was not taking the others.  CaSherlon HandingPharmD, BCPS Please see amion for complete clinical pharmacist phone list 11/26/2022 2:17 PM

## 2022-11-26 NOTE — Plan of Care (Signed)
  Problem: Safety: Goal: Violent Restraint(s) Outcome: Not Progressing   Problem: Education: Goal: Knowledge of General Education information will improve Description: Including pain rating scale, medication(s)/side effects and non-pharmacologic comfort measures Outcome: Not Progressing   Problem: Health Behavior/Discharge Planning: Goal: Ability to manage health-related needs will improve Outcome: Not Progressing   Problem: Clinical Measurements: Goal: Ability to maintain clinical measurements within normal limits will improve Outcome: Not Progressing Goal: Will remain free from infection Outcome: Not Progressing Goal: Diagnostic test results will improve Outcome: Not Progressing Goal: Respiratory complications will improve Outcome: Not Progressing Goal: Cardiovascular complication will be avoided Outcome: Not Progressing   Problem: Activity: Goal: Risk for activity intolerance will decrease Outcome: Not Progressing   Problem: Nutrition: Goal: Adequate nutrition will be maintained Outcome: Not Progressing   Problem: Coping: Goal: Level of anxiety will decrease Outcome: Not Progressing   Problem: Elimination: Goal: Will not experience complications related to bowel motility Outcome: Not Progressing Goal: Will not experience complications related to urinary retention Outcome: Not Progressing   Problem: Pain Managment: Goal: General experience of comfort will improve Outcome: Not Progressing   Problem: Safety: Goal: Ability to remain free from injury will improve Outcome: Not Progressing   Problem: Skin Integrity: Goal: Risk for impaired skin integrity will decrease Outcome: Not Progressing   Problem: Safety: Goal: Violent Restraint(s) Outcome: Not Progressing

## 2022-11-26 NOTE — ED Notes (Addendum)
At approximately 1311 family member alerted RN that patient was about to have a seizure, upon entering room RN noted a fixed gaze for about 30 seconds. Patient became alert, responded to his name and sat up, attempting to get out of the bed. Patient then laid back in the bed and had fixed gaze with nystagmus for about 2 minutes with urinary incontinence. Patient did not respond to his name during this time. Provider notified and ordered medication.

## 2022-11-26 NOTE — ED Notes (Signed)
Neurology/Bhagat paged per verbal order of PA Harris.
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
## 2022-11-26 NOTE — Assessment & Plan Note (Addendum)
Noted to have 4 seizures earlier today, appears likely secondary to noncompliance. Per chart review, patient seems to follow up with neurology outpatient at [place] [facility]. CT head notable for no acute changes. -admit to FPTS, attending Dr. Gwendlyn Deutscher -neurology consulted, appreciate recs -s/p keppra and valproic acid loading dose  -EEG per neuro -seizure precautions -neuro checks -am CBC, CMP, Mg -pending [place] -ordered ethanol level -vitals per routine
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
## 2022-11-26 NOTE — Progress Notes (Incomplete)
FMTS Brief Progress Note  S:***   O: BP 128/67   Pulse 60   Resp (!) 25   SpO2 100%     A/P: Seizures  Suspected secondary to non-compliance. Has received depakote and keppra loading dose. Depakote level within therapeutic range (85).  -Neuro rec *** -added ativan PRN   Agitation  SI -psych consulted, patient under IVC, expires 3/12  - Orders reviewed. Labs for AM ordered, which was adjusted as needed.  - If condition changes, plan includes ***.   Rolanda Lundborg, MD 11/26/2022, 7:23 PM PGY-1, [place] Night Resident  Please page [phone] with questions.

## 2022-11-26 NOTE — ED Notes (Signed)
ED provider and neuro made aware of patient condition, medications ordered.

## 2022-11-26 NOTE — ED Notes (Signed)
Neuroloy/Lindzen paged per verbal order of PA Harris.

## 2022-11-26 NOTE — ED Notes (Signed)
IVC documentation (3 sets) given to RN for patient transport upstairs to [phone].
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
## 2022-11-26 NOTE — ED Notes (Signed)
IVC'd 11/26/22, exp 12/03/22; three (3) sets of IVC docs in blue zone. Copies faxed to [facility] and filed in medical records.  Originals filed in Scientist, clinical (histocompatibility and immunogenetics).

## 2022-11-26 NOTE — ED Notes (Signed)
ED TO INPATIENT HANDOFF REPORT  ED Nurse Name and Phone #: Wilbern Pennypacker, RN [phone]  S Name/Age/Gender Dylan Moon 27 y.o. male Room/Bed: 034C/034C  Code Status   Code Status: Full Code  Home/SNF/Other Home Patient oriented to: self, place, time, and situation Is this baseline? Yes   Triage Complete: Triage complete  Chief Complaint Seizure [facility]) [R56.9]  Triage Note No notes on file   Allergies Allergies  Allergen Reactions   Keppra [Levetiracetam] Other (See Comments)    Irritability    Tramadol Other (See Comments)    Contraindicated with current medications (??)   Vimpat [Lacosamide] Other (See Comments)    Causes anger    Level of Care/Admitting Diagnosis ED Disposition     ED Disposition  Admit   Condition  --   [facility]: [place] [[id number]]  Level of Care: Progressive [102]  Admit to Progressive based on following criteria: NEUROLOGICAL AND NEUROSURGICAL complex patients with significant risk of instability, who do not meet ICU criteria, yet require close observation or frequent assessment (< / = every 2 - 4 hours) with medical / nursing intervention.  Admit to Progressive based on following criteria: ACUTE MENTAL DISORDER-RELATED Drug/Alcohol Ingestion/Overdose/Withdrawal, Suicidal Ideation/attempt requiring safety sitter and < Q2h monitoring/assessments, moderate to severe agitation that is managed with medication/sitter, CIWA-Ar score < 20.  May place patient in observation at [facility] or [place] if equivalent level of care is available:: No  Covid Evaluation: Asymptomatic - no recent exposure (last 10 days) testing not required  Diagnosis: Seizure [facility]) [id number]  Admitting Physician: Marla Roe  Attending Physician: Marla Roe          B Medical/Surgery History Past Medical History:  Diagnosis Date   ADHD    Bipolar 1 disorder ([place])    Schizoaffective disorder ([place])    Scoliosis     Seizures ([place])    most recent 12/02/17   Past Surgical History:  Procedure Laterality Date   NO PAST SURGERIES       A IV Location/Drains/Wounds Patient Lines/Drains/Airways Status     Active Line/Drains/Airways     Name Placement date Placement time Site Days   Peripheral IV 11/26/22 20 G Anterior;Distal;Left;Upper Arm 11/26/22  --  Arm  less than 1            Intake/Output Last 24 hours No intake or output data in the 24 hours ending 11/26/22 1659  Labs/Imaging Results for orders placed or performed during the hospital encounter of 11/26/22 (from the past 48 hour(s))  Valproic Acid (depakote) Level (if patient is taking this medication)     Status: None   Collection Time: 11/26/22 11:42 AM  Result Value Ref Range   Valproic Acid Lvl 85 50.0 - 100.0 ug/mL    Comment: Performed at [facility], [address], [place], [place] [address]  CBC - if new onset seizures     Status: Abnormal   Collection Time: 11/26/22 11:42 AM  Result Value Ref Range   WBC 11.6 (H) 4.0 - 10.5 K/uL   RBC 5.54 4.22 - 5.81 MIL/uL   Hemoglobin 15.5 13.0 - 17.0 g/dL   HCT 47.0 39.0 - 52.0 %   MCV 84.8 80.0 - 100.0 fL   MCH 28.0 26.0 - 34.0 pg   MCHC 33.0 30.0 - 36.0 g/dL   RDW 13.2 11.5 - 15.5 %   Platelets 250 150 - 400 K/uL   nRBC 0.0  0.0 - 0.2 %    Comment: Performed at [facility], [place] [address], [place], [place] [address]  Comprehensive metabolic panel     Status: None   Collection Time: 11/26/22 11:48 AM  Result Value Ref Range   Sodium 137 135 - 145 mmol/L   Potassium 5.0 3.5 - 5.1 mmol/L   Chloride 103 98 - 111 mmol/L   CO2 22 22 - 32 mmol/L   Glucose, Bld 83 70 - 99 mg/dL    Comment: Glucose reference range applies only to samples taken after fasting for at least 8 hours.   BUN 6 6 - 20 mg/dL   Creatinine, Ser 1.20 0.61 - 1.24 mg/dL   Calcium 9.8 8.9 - 10.3 mg/dL   Total Protein 7.3 6.5 - 8.1 g/dL   Albumin 4.5 3.5 - 5.0 g/dL   AST 41 15 - 41 U/L   ALT 22 0  - 44 U/L   Alkaline Phosphatase 40 38 - 126 U/L   Total Bilirubin 0.6 0.3 - 1.2 mg/dL   GFR, Estimated >60 >60 mL/min    Comment: (NOTE) Calculated using the CKD-EPI Creatinine Equation (2021)    Anion gap 12 5 - 15    Comment: Performed at [facility] [address] [place], [place] [address]  Magnesium     Status: None   Collection Time: 11/26/22 11:48 AM  Result Value Ref Range   Magnesium 2.2 1.7 - 2.4 mg/dL    Comment: Performed at [facility] [address] [place], [place] [address]  CBG monitoring, ED     Status: None   Collection Time: 11/26/22  1:44 PM  Result Value Ref Range   Glucose-Capillary 88 70 - 99 mg/dL    Comment: Glucose reference range applies only to samples taken after fasting for at least 8 hours.   No results found.  Pending Labs Unresulted Labs (From admission, onward)     Start     Ordered   11/27/22 0500  Comprehensive metabolic panel  Tomorrow morning,   R        11/26/22 1556   11/27/22 0500  CBC  Tomorrow morning,   R        11/26/22 1556   11/27/22 0500  Magnesium  Tomorrow morning,   R        11/26/22 1556   11/26/22 1601  Ethanol  Once,   R        11/26/22 1600   11/26/22 1555  HIV Antibody (routine testing w rflx)  (HIV Antibody (Routine testing w reflex) panel)  Once,   R        11/26/22 1556   11/26/22 1149  Urinalysis, Routine w reflex microscopic -Urine, Clean Catch  Once,   URGENT       Question Answer Comment  Specimen Source Urine, Clean Catch   Release to patient Immediate      11/26/22 1149   11/26/22 1149  Rapid urine drug screen (hospital performed)  Once,   STAT       Question:  Release to patient  Answer:  Immediate   11/26/22 1149            Vitals/Pain Today's Vitals   11/26/22 1139 11/26/22 1330 11/26/22 1445 11/26/22 1555  BP:  134/62 128/67   Pulse:  73 60   Resp:  (!) 31 (!) 25   SpO2:  100% 100%   PainSc: 0-No pain   Asleep  Isolation Precautions No active  isolations  Medications Medications  midazolam (VERSED) injection 2 mg (has no administration in time range)  enoxaparin (LOVENOX) injection 40 mg (has no administration in time range)  midazolam (VERSED) injection 2 mg (2 mg Intravenous Given 11/26/22 1319)  valproate (DEPACON) 1,000 mg in dextrose 5 % 50 mL IVPB (0 mg Intravenous Stopped 11/26/22 1511)  levETIRAcetam (KEPPRA) 3,000 mg in sodium chloride 0.9 % 250 mL IVPB (0 mg Intravenous Stopped 11/26/22 1553)    Mobility walks     Focused Assessments    R Recommendations: See Admitting Provider Note  Report given to:   Additional Notes: Patient was given midazolam around 1330, patient remains somnolent. He has a hx of being aggressive when he is postictal but has been calm and cooperative so far. Notes are in chart regarding absent seizure around 1330.

## 2022-11-26 NOTE — Assessment & Plan Note (Addendum)
Patient with extensive behavioral health history including ADHD, bipolar 1, schizoaffective disorder and depression. Experienced active SI upon admission where he attempted to throw himself out of a window. Currently under IVC, order placed by ED provider.  -psych consulted, appreciate recs and involvement as patient may benefit from inpatient psychiatry once medically stable -suicide precautions

## 2022-11-26 NOTE — H&P (Signed)
Hospital Admission History and Physical Service Pager: [phone]  Patient name: Dylan Moon Medical record number: [id number] Date of Birth: 1996/08/19 Age: 27 y.o. Gender: male  Primary Care Provider: [facility], [facility]: Neurology, Psychiatry  Code Status: Full code   Preferred Emergency Contact: girlfriend   Chief Complaint: seizure  Assessment and Plan: Dylan Moon is a 27 y.o. male presenting with seizures . Most likely secondary to medication noncompliance. Other considerations include electrolyte derangements which is less likely given electrolytes are within normal limits. Pending UDS for possible substance use. Considered alcohol withdrawal, ethanol level pending. Plan to admit for monitoring and management of seizures. Patient will likely qualify for inpatient psychiatry when medically clear.   * Seizure ([place]) Noted to have 4 seizures earlier today, appears likely secondary to noncompliance. Per chart review, patient seems to follow up with neurology outpatient at [facility]. CT head notable for no acute changes. -admit to FPTS, attending Dr. Gwendlyn Deutscher -neurology consulted, appreciate recs -s/p keppra and valproic acid loading dose  -EEG per neuro -seizure precautions -neuro checks -am CBC, CMP, Mg -pending [place] -ordered ethanol level -vitals per routine  Suicidal ideation Patient with extensive behavioral health history including ADHD, bipolar 1, schizoaffective disorder and depression. Experienced active SI upon admission where he attempted to throw himself out of a window. Currently under IVC, order placed by ED provider.  -psych consulted, appreciate recs and involvement as patient may benefit from inpatient psychiatry once medically stable -suicide precautions  Aggressive behavior On admission, aggressive and combative. Patient remains to be harmful to both staff and self, s/p IV '2mg'$  versed.  -continue versed '2mg'$  q2h prn -continue restraints  with continuous reevaluation and remove as appropriate    FEN/GI: NPO until more alert, then regular diet  VTE Prophylaxis: lovenox  Disposition: admit to progressive, attending Dr. Gwendlyn Deutscher   History of Present Illness:  Dylan Moon is a 27 y.o. male presenting with seizure. History limited by patient's mental status. Seizure at home earlier today, witnessed by girlfriend. Noted to have urinary incontinence without tongue biting. Unknown of last seziure prior to today but patient states that he has been getting them daily. Typical seizures seem to be tonic-clonic but today's were absence seizures without convulsions or jerking movements at home. Follows with neurology outpatient at [facility]. Patient reports active SI. He attempted to throw himself out of a window per ED provider.   In the ED, patient given versed due to combativeness. Neurology consulted, given a loading dose of keppra and valproic acid.   History and exam limited by patient's mental status.   Review Of Systems: Per HPI with the following additions: detailed as above  Pertinent Past Medical History: Seizure disorder Schizoaffective disorder Bipolar 1  Depression ADHD Scoliosis   Pertinent Past Surgical History: None   Remainder reviewed in history tab.  Pertinent Social History: Tobacco use: Yes, smokes cigars Alcohol use: no Other Substance use: marijuana use (daily) Lives with family, girlfriend lives nearby   Pertinent Family History: Diabetes Hypertension Stroke  Remainder reviewed in history tab.   Important Outpatient Medications: Valproic acid  Remainder reviewed in medication history.   Objective: BP 128/67   Pulse 60   Resp (!) 25   SpO2 100%  Exam: General: Patient laying comfortably in bed, awakens to my exam and then returns to sleep. In no acute distress. ENTM: tracking appropriately  Neck: supple Cardiovascular: RRR, no murmurs or gallops auscultated  Respiratory: CTAB, no  wheezing,  rales or rhonchi noted Gastrointestinal: soft, nontender, nondistended, presence of bowel sounds MSK: no LE edema  Neuro: unable to perform Psych: not very engaged, reports SI  Exam limited by patient's mental status and cooperation. Unable to perform full neurological exam as patient's does not answer all questions.   Labs:  CBC BMET  Recent Labs  Lab 11/26/22 1142  WBC 11.6*  HGB 15.5  HCT 47.0  PLT 250   Recent Labs  Lab 11/26/22 1148  NA 137  K 5.0  CL 103  CO2 22  BUN 6  CREATININE 1.20  GLUCOSE 83  CALCIUM 9.8      Imaging Studies Performed:  Imaging Study (ie. Chest x-ray) CXR: no active disease, no opacities or evidence of effusion or pneumothorax noted CT head: no acute findings    Donney Dice, DO 11/26/2022, 4:59 PM PGY-3, [place] Intern pager: [phone], text pages welcome Secure chat group [place]
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
## 2022-11-26 NOTE — Progress Notes (Signed)
Patient was highly combative in the ED tonight, per ED provider's note. Patient had to be physically and chemically restrained due to aggressive/hostile behavior. EEG Tech reached out to the 3W RN to see if EEG should wait; she requested EEG to be done in the AM because patient is still highly agitated and required another sedative.

## 2022-11-26 NOTE — Progress Notes (Signed)
Unable to perform admission questions at this time. Patient agitated, refusing to answer. Patient had to be medicated for his agitation.

## 2022-11-26 NOTE — Progress Notes (Signed)
Patient arrived to the unit as a new admission from ED. Patient extremely agitated, attempting to get out of his bed to leave the hospital. Security had to intervene and place the patient in 4 points restraints ( see order ). PRN ordered Midazolam had to be administer for his agitation. Sitter at bedside for safety per order. EEG tech asking if she can place the patient in EEG monitoring, not safe at this time due patient's agitation. Will continue to monitor.

## 2022-11-27 ENCOUNTER — Encounter (HOSPITAL_COMMUNITY): Payer: Self-pay | Admitting: Family Medicine

## 2022-11-27 DIAGNOSIS — F250 Schizoaffective disorder, bipolar type: Secondary | ICD-10-CM | POA: Diagnosis not present

## 2022-11-27 DIAGNOSIS — G40909 Epilepsy, unspecified, not intractable, without status epilepticus: Secondary | ICD-10-CM | POA: Diagnosis not present

## 2022-11-27 DIAGNOSIS — E871 Hypo-osmolality and hyponatremia: Secondary | ICD-10-CM | POA: Diagnosis present

## 2022-11-27 DIAGNOSIS — W134XXA Fall from, out of or through window, initial encounter: Secondary | ICD-10-CM | POA: Diagnosis present

## 2022-11-27 DIAGNOSIS — F259 Schizoaffective disorder, unspecified: Secondary | ICD-10-CM | POA: Diagnosis present

## 2022-11-27 DIAGNOSIS — K047 Periapical abscess without sinus: Secondary | ICD-10-CM | POA: Diagnosis present

## 2022-11-27 DIAGNOSIS — Z781 Physical restraint status: Secondary | ICD-10-CM | POA: Diagnosis not present

## 2022-11-27 DIAGNOSIS — M419 Scoliosis, unspecified: Secondary | ICD-10-CM | POA: Diagnosis present

## 2022-11-27 DIAGNOSIS — B370 Candidal stomatitis: Secondary | ICD-10-CM | POA: Diagnosis present

## 2022-11-27 DIAGNOSIS — N179 Acute kidney failure, unspecified: Secondary | ICD-10-CM

## 2022-11-27 DIAGNOSIS — Z885 Allergy status to narcotic agent status: Secondary | ICD-10-CM | POA: Diagnosis not present

## 2022-11-27 DIAGNOSIS — Z888 Allergy status to other drugs, medicaments and biological substances status: Secondary | ICD-10-CM | POA: Diagnosis not present

## 2022-11-27 DIAGNOSIS — Y92009 Unspecified place in unspecified non-institutional (private) residence as the place of occurrence of the external cause: Secondary | ICD-10-CM | POA: Diagnosis not present

## 2022-11-27 DIAGNOSIS — R569 Unspecified convulsions: Secondary | ICD-10-CM | POA: Diagnosis not present

## 2022-11-27 DIAGNOSIS — R45851 Suicidal ideations: Secondary | ICD-10-CM | POA: Diagnosis present

## 2022-11-27 DIAGNOSIS — R451 Restlessness and agitation: Secondary | ICD-10-CM | POA: Diagnosis not present

## 2022-11-27 DIAGNOSIS — F17290 Nicotine dependence, other tobacco product, uncomplicated: Secondary | ICD-10-CM | POA: Diagnosis present

## 2022-11-27 DIAGNOSIS — Z833 Family history of diabetes mellitus: Secondary | ICD-10-CM | POA: Diagnosis not present

## 2022-11-27 DIAGNOSIS — Y9339 Activity, other involving climbing, rappelling and jumping off: Secondary | ICD-10-CM | POA: Diagnosis not present

## 2022-11-27 DIAGNOSIS — F79 Unspecified intellectual disabilities: Secondary | ICD-10-CM | POA: Diagnosis present

## 2022-11-27 DIAGNOSIS — F23 Brief psychotic disorder: Secondary | ICD-10-CM | POA: Diagnosis not present

## 2022-11-27 DIAGNOSIS — F600 Paranoid personality disorder: Secondary | ICD-10-CM | POA: Diagnosis present

## 2022-11-27 DIAGNOSIS — F319 Bipolar disorder, unspecified: Secondary | ICD-10-CM | POA: Diagnosis present

## 2022-11-27 DIAGNOSIS — K567 Ileus, unspecified: Secondary | ICD-10-CM | POA: Diagnosis not present

## 2022-11-27 DIAGNOSIS — R34 Anuria and oliguria: Secondary | ICD-10-CM | POA: Diagnosis not present

## 2022-11-27 DIAGNOSIS — T1491XA Suicide attempt, initial encounter: Secondary | ICD-10-CM | POA: Diagnosis not present

## 2022-11-27 DIAGNOSIS — Z79899 Other long term (current) drug therapy: Secondary | ICD-10-CM | POA: Diagnosis not present

## 2022-11-27 DIAGNOSIS — F4325 Adjustment disorder with mixed disturbance of emotions and conduct: Secondary | ICD-10-CM | POA: Diagnosis not present

## 2022-11-27 DIAGNOSIS — F3163 Bipolar disorder, current episode mixed, severe, without psychotic features: Secondary | ICD-10-CM | POA: Diagnosis not present

## 2022-11-27 DIAGNOSIS — F909 Attention-deficit hyperactivity disorder, unspecified type: Secondary | ICD-10-CM | POA: Diagnosis present

## 2022-11-27 DIAGNOSIS — Z8249 Family history of ischemic heart disease and other diseases of the circulatory system: Secondary | ICD-10-CM | POA: Diagnosis not present

## 2022-11-27 DIAGNOSIS — G40A09 Absence epileptic syndrome, not intractable, without status epilepticus: Secondary | ICD-10-CM | POA: Diagnosis present

## 2022-11-27 DIAGNOSIS — R32 Unspecified urinary incontinence: Secondary | ICD-10-CM | POA: Diagnosis present

## 2022-11-27 DIAGNOSIS — F1290 Cannabis use, unspecified, uncomplicated: Secondary | ICD-10-CM | POA: Diagnosis not present

## 2022-11-27 DIAGNOSIS — R4689 Other symptoms and signs involving appearance and behavior: Secondary | ICD-10-CM | POA: Diagnosis not present

## 2022-11-27 DIAGNOSIS — Z91148 Patient's other noncompliance with medication regimen for other reason: Secondary | ICD-10-CM | POA: Diagnosis not present

## 2022-11-27 LAB — CBC
HCT: 43.6 % (ref 39.0–52.0)
Hemoglobin: 14.2 g/dL (ref 13.0–17.0)
MCH: 27.9 pg (ref 26.0–34.0)
MCHC: 32.6 g/dL (ref 30.0–36.0)
MCV: 85.7 fL (ref 80.0–100.0)
Platelets: 229 10*3/uL (ref 150–400)
RBC: 5.09 MIL/uL (ref 4.22–5.81)
RDW: 13.4 % (ref 11.5–15.5)
WBC: 9.6 10*3/uL (ref 4.0–10.5)
nRBC: 0 % (ref 0.0–0.2)

## 2022-11-27 LAB — COMPREHENSIVE METABOLIC PANEL
ALT: 22 U/L (ref 0–44)
AST: 54 U/L — ABNORMAL HIGH (ref 15–41)
Albumin: 4.4 g/dL (ref 3.5–5.0)
Alkaline Phosphatase: 39 U/L (ref 38–126)
Anion gap: 24 — ABNORMAL HIGH (ref 5–15)
BUN: 9 mg/dL (ref 6–20)
CO2: 17 mmol/L — ABNORMAL LOW (ref 22–32)
Calcium: 9.4 mg/dL (ref 8.9–10.3)
Chloride: 98 mmol/L (ref 98–111)
Creatinine, Ser: 1.48 mg/dL — ABNORMAL HIGH (ref 0.61–1.24)
GFR, Estimated: >60 mL/min (ref >60)
Glucose, Bld: 98 mg/dL (ref 70–99)
Potassium: 3.4 mmol/L — ABNORMAL LOW (ref 3.5–5.1)
Sodium: 139 mmol/L (ref 135–145)
Total Bilirubin: 1.2 mg/dL (ref 0.3–1.2)
Total Protein: 7.2 g/dL (ref 6.5–8.1)

## 2022-11-27 LAB — GLUCOSE, CAPILLARY
Glucose-Capillary: 76 mg/dL (ref 70–99)
Glucose-Capillary: 97 mg/dL (ref 70–99)

## 2022-11-27 LAB — MAGNESIUM: Magnesium: 1.8 mg/dL (ref 1.7–2.4)

## 2022-11-27 MED ORDER — ZIPRASIDONE MESYLATE 20 MG IM SOLR
10.0000 mg | Freq: Once | INTRAMUSCULAR | Status: AC | PRN
  Administered 2022-11-27: 10 mg via INTRAMUSCULAR
  Filled 2022-11-27 (×2): qty 20

## 2022-11-27 MED ORDER — DEXMEDETOMIDINE HCL IN NACL 400 MCG/100ML IV SOLN
INTRAVENOUS | Status: AC
  Filled 2022-11-27: qty 100

## 2022-11-27 MED ORDER — HALOPERIDOL 5 MG PO TABS
10.0000 mg | ORAL_TABLET | Freq: Two times a day (BID) | ORAL | Status: DC
  Filled 2022-11-27 (×2): qty 2

## 2022-11-27 MED ORDER — THIAMINE HCL 100 MG/ML IJ SOLN
100.0000 mg | Freq: Every day | INTRAMUSCULAR | Status: DC
  Administered 2022-11-27 – 2022-12-01 (×5): 100 mg via INTRAVENOUS
  Filled 2022-11-27 (×5): qty 2

## 2022-11-27 MED ORDER — LORAZEPAM 1 MG PO TABS: 1.0000 mg | ORAL_TABLET | Freq: Two times a day (BID) | ORAL | Status: DC

## 2022-11-27 MED ORDER — MIDAZOLAM HCL 2 MG/2ML IJ SOLN
2.0000 mg | INTRAMUSCULAR | Status: DC | PRN
  Administered 2022-11-27 – 2022-11-29 (×14): 4 mg via INTRAVENOUS
  Administered 2022-12-01 – 2022-12-02 (×2): 2 mg via INTRAVENOUS
  Administered 2022-12-02: 4 mg via INTRAVENOUS
  Administered 2022-12-02: 2 mg via INTRAVENOUS
  Administered 2022-12-03: 4 mg via INTRAVENOUS
  Administered 2022-12-03 (×4): 2 mg via INTRAVENOUS
  Administered 2022-12-04: 4 mg via INTRAVENOUS
  Filled 2022-11-27 (×2): qty 4
  Filled 2022-11-27 (×2): qty 2
  Filled 2022-11-27: qty 4
  Filled 2022-11-27: qty 2
  Filled 2022-11-27 (×3): qty 4
  Filled 2022-11-27: qty 2
  Filled 2022-11-27 (×3): qty 4
  Filled 2022-11-27: qty 2
  Filled 2022-11-27 (×4): qty 4
  Filled 2022-11-27 (×3): qty 2

## 2022-11-27 MED ORDER — SODIUM CHLORIDE 0.9 % IV SOLN
1430.0000 mg | INTRAVENOUS | Status: DC
  Filled 2022-11-27: qty 11

## 2022-11-27 MED ORDER — DEXMEDETOMIDINE HCL IN NACL 400 MCG/100ML IV SOLN
0.0000 ug/kg/h | INTRAVENOUS | Status: DC
  Administered 2022-11-27: 0.4 ug/kg/h via INTRAVENOUS

## 2022-11-27 MED ORDER — HALOPERIDOL LACTATE 5 MG/ML IJ SOLN: 5.0000 mg | Freq: Two times a day (BID) | INTRAMUSCULAR | Status: DC

## 2022-11-27 MED ORDER — LORAZEPAM 1 MG PO TABS: 2.0000 mg | ORAL_TABLET | Freq: Three times a day (TID) | ORAL | Status: DC

## 2022-11-27 MED ORDER — SODIUM CHLORIDE 0.9 % IV SOLN
260.0000 mg | INTRAVENOUS | Status: DC
  Administered 2022-11-27 – 2022-11-29 (×11): 260 mg via INTRAVENOUS
  Filled 2022-11-27 (×6): qty 2
  Filled 2022-11-27: qty 1
  Filled 2022-11-27 (×9): qty 2

## 2022-11-27 MED ORDER — LORAZEPAM 2 MG/ML IJ SOLN
2.0000 mg | Freq: Three times a day (TID) | INTRAMUSCULAR | Status: DC
  Administered 2022-11-27 – 2022-11-29 (×6): 2 mg via INTRAVENOUS
  Filled 2022-11-27 (×6): qty 1

## 2022-11-27 MED ORDER — MIDAZOLAM HCL 2 MG/2ML IJ SOLN
2.0000 mg | INTRAMUSCULAR | Status: DC | PRN
  Administered 2022-12-01: 2 mg via INTRAMUSCULAR
  Filled 2022-11-27: qty 4
  Filled 2022-11-27: qty 2
  Filled 2022-11-27: qty 4
  Filled 2022-11-27: qty 2

## 2022-11-27 MED ORDER — HALOPERIDOL LACTATE 5 MG/ML IJ SOLN
10.0000 mg | Freq: Two times a day (BID) | INTRAMUSCULAR | Status: DC
  Administered 2022-11-27 – 2022-11-28 (×2): 10 mg via INTRAVENOUS
  Filled 2022-11-27 (×2): qty 2

## 2022-11-27 MED ORDER — HALOPERIDOL LACTATE 5 MG/ML IJ SOLN
5.0000 mg | Freq: Two times a day (BID) | INTRAMUSCULAR | Status: DC
  Filled 2022-11-27: qty 1

## 2022-11-27 MED ORDER — LORAZEPAM 2 MG/ML IJ SOLN: 1.0000 mg | Freq: Two times a day (BID) | INTRAMUSCULAR | Status: DC

## 2022-11-27 MED ORDER — MIDAZOLAM HCL 2 MG/2ML IJ SOLN
INTRAMUSCULAR | Status: AC
  Administered 2022-11-27: 2 mg via INTRAVENOUS
  Filled 2022-11-27: qty 2

## 2022-11-27 MED ORDER — PHENOBARBITAL SODIUM 130 MG/ML IJ SOLN
130.0000 mg | INTRAMUSCULAR | Status: AC
  Administered 2022-11-27: 130 mg via INTRAVENOUS
  Filled 2022-11-27: qty 1

## 2022-11-27 MED ORDER — VALPROIC ACID 250 MG/5ML PO SOLN
1000.0000 mg | Freq: Two times a day (BID) | ORAL | Status: DC
  Filled 2022-11-27: qty 20

## 2022-11-27 MED ORDER — HALOPERIDOL LACTATE 5 MG/ML IJ SOLN: 5.0000 mg | Freq: Two times a day (BID) | INTRAMUSCULAR | Status: DC | PRN

## 2022-11-27 MED ORDER — HALOPERIDOL 5 MG PO TABS: 7.5000 mg | ORAL_TABLET | Freq: Two times a day (BID) | ORAL | Status: DC

## 2022-11-27 MED ORDER — POTASSIUM CHLORIDE 10 MEQ/100ML IV SOLN
10.0000 meq | INTRAVENOUS | Status: AC
  Administered 2022-11-27 (×4): 10 meq via INTRAVENOUS
  Filled 2022-11-27 (×4): qty 100

## 2022-11-27 MED ORDER — HALOPERIDOL LACTATE 5 MG/ML IJ SOLN
7.5000 mg | Freq: Two times a day (BID) | INTRAMUSCULAR | Status: DC
  Administered 2022-11-27: 7.5 mg via INTRAVENOUS
  Filled 2022-11-27: qty 2

## 2022-11-27 MED ORDER — LORAZEPAM 2 MG/ML IJ SOLN
INTRAMUSCULAR | Status: AC
  Administered 2022-11-27: 1 mg via INTRAVENOUS
  Filled 2022-11-27: qty 1

## 2022-11-27 MED ORDER — OLANZAPINE 10 MG IM SOLR
10.0000 mg | Freq: Once | INTRAMUSCULAR | Status: AC
  Administered 2022-11-27: 10 mg via INTRAMUSCULAR
  Filled 2022-11-27: qty 10

## 2022-11-27 MED ORDER — SODIUM CHLORIDE 0.9 % IV SOLN: 15.0000 mg/kg | INTRAVENOUS | Status: DC

## 2022-11-27 MED ORDER — ACETAMINOPHEN 325 MG PO TABS: 650.0000 mg | ORAL_TABLET | ORAL | Status: DC | PRN

## 2022-11-27 MED ORDER — LACTATED RINGERS IV SOLN: INTRAVENOUS | Status: DC

## 2022-11-27 MED ORDER — LORAZEPAM 2 MG/ML IJ SOLN: 1.0000 mg | Freq: Three times a day (TID) | INTRAMUSCULAR | Status: DC

## 2022-11-27 MED ORDER — ZIPRASIDONE MESYLATE 20 MG IM SOLR
10.0000 mg | Freq: Four times a day (QID) | INTRAMUSCULAR | Status: DC | PRN
  Administered 2022-11-27 – 2022-11-28 (×3): 10 mg via INTRAMUSCULAR
  Filled 2022-11-27 (×5): qty 20

## 2022-11-27 MED ORDER — MIDAZOLAM HCL 2 MG/2ML IJ SOLN
2.0000 mg | INTRAMUSCULAR | Status: DC | PRN
  Filled 2022-11-27 (×2): qty 4

## 2022-11-27 MED ORDER — WHITE PETROLATUM EX OINT: TOPICAL_OINTMENT | CUTANEOUS | Status: DC | PRN

## 2022-11-27 MED ORDER — HALOPERIDOL LACTATE 5 MG/ML IJ SOLN
5.0000 mg | Freq: Four times a day (QID) | INTRAMUSCULAR | Status: AC | PRN
  Administered 2022-11-27: 5 mg via INTRAVENOUS

## 2022-11-27 MED ORDER — VALPROATE SODIUM 100 MG/ML IV SOLN
1000.0000 mg | Freq: Two times a day (BID) | INTRAVENOUS | Status: DC
  Administered 2022-11-27 – 2022-11-30 (×8): 1000 mg via INTRAVENOUS
  Filled 2022-11-27 (×13): qty 10

## 2022-11-27 MED ORDER — ENOXAPARIN SODIUM 40 MG/0.4ML IJ SOSY
40.0000 mg | PREFILLED_SYRINGE | Freq: Every day | INTRAMUSCULAR | Status: DC
  Administered 2022-11-28 – 2022-12-08 (×11): 40 mg via SUBCUTANEOUS
  Filled 2022-11-27 (×11): qty 0.4

## 2022-11-27 MED ORDER — LORAZEPAM 2 MG/ML IJ SOLN: 1.0000 mg | Freq: Two times a day (BID) | INTRAMUSCULAR | Status: DC | PRN

## 2022-11-27 MED ORDER — STERILE WATER FOR INJECTION IJ SOLN
INTRAMUSCULAR | Status: AC
  Filled 2022-11-27: qty 10

## 2022-11-27 MED ORDER — MIDAZOLAM HCL 2 MG/2ML IJ SOLN
4.0000 mg | Freq: Once | INTRAMUSCULAR | Status: AC
  Administered 2022-11-27: 4 mg via INTRAMUSCULAR

## 2022-11-27 MED ORDER — LORAZEPAM 1 MG PO TABS
2.0000 mg | ORAL_TABLET | Freq: Three times a day (TID) | ORAL | Status: DC
  Administered 2022-11-29: 2 mg via ORAL
  Filled 2022-11-27: qty 2

## 2022-11-27 MED ORDER — DOCUSATE SODIUM 100 MG PO CAPS: 100.0000 mg | ORAL_CAPSULE | Freq: Two times a day (BID) | ORAL | Status: DC | PRN

## 2022-11-27 MED ORDER — MIDAZOLAM HCL 2 MG/2ML IJ SOLN
INTRAMUSCULAR | Status: AC
  Filled 2022-11-27: qty 4

## 2022-11-27 MED ORDER — DEXMEDETOMIDINE HCL IN NACL 400 MCG/100ML IV SOLN: 0.0000 ug/kg/h | INTRAVENOUS | Status: DC

## 2022-11-27 MED ORDER — STERILE WATER FOR INJECTION IJ SOLN
INTRAMUSCULAR | Status: AC
  Administered 2022-11-27: 10 mL
  Filled 2022-11-27: qty 10

## 2022-11-27 MED ORDER — ZIPRASIDONE MESYLATE 20 MG IM SOLR: 10.0000 mg | Freq: Once | INTRAMUSCULAR | Status: DC | PRN

## 2022-11-27 MED ORDER — POLYETHYLENE GLYCOL 3350 17 G PO PACK
17.0000 g | PACK | Freq: Every day | ORAL | Status: DC | PRN
  Administered 2022-12-01: 17 g via ORAL
  Filled 2022-11-27: qty 1

## 2022-11-27 MED ORDER — MIDAZOLAM HCL 2 MG/2ML IJ SOLN
2.0000 mg | Freq: Once | INTRAMUSCULAR | Status: AC
  Administered 2022-11-27: 2 mg via INTRAVENOUS

## 2022-11-27 MED ORDER — DEXTROSE IN LACTATED RINGERS 5 % IV SOLN: INTRAVENOUS | Status: DC

## 2022-11-27 NOTE — Progress Notes (Signed)
Cortrak Tube Team Note:  Consult received to place a Cortrak feeding tube. Pt became extremely agitated during placement of tube. Multiple RN's, NT's and this RD attempted to de-escalate behavior to continue with placement. Pt continued to be agitated and sitting up despite multiple restraints. MD notified of increased agitation and agreed with termination of placement.    Dylan Moon RD, LDN Clinical Dietitian See Shea Evans for contact information.

## 2022-11-27 NOTE — Progress Notes (Signed)
PT Cancellation Note  Patient Details Name: Dylan Moon MRN: [id number] DOB: 1996/09/12   Cancelled Treatment:    Reason Eval/Treat Not Completed: Other (comment). Per nursing staff and observing pt from outside the room, pt currently agitated and impulsive. NT reporting it would not be a "good idea" to get pt up right now due to him being aggressive towards staff in the ED and his current behavior. Pt sitting up, trying to scoot OOB with 4 point restraints on currently. Will hold off on therapy at this time and re-attempt when pt is more calm and more easily redirectable.   Moishe Spice, PT, DPT Acute Rehabilitation Services  Office: [place] 11/27/2022, 7:52 AM
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
## 2022-11-27 NOTE — Progress Notes (Signed)
FMTS Interim Progress Note  Went to rapid response and evaluated patient with Dr. Adah Salvage. Patient severely agitated, violent towards staff. Requiring violent restraints to maintain safety to self and others. Psychiatry at bedside and recommending IV Haldol and Geodon.   Re-dosed Geodon 10 mg due to severe agitation. Will also re-dose haldol IV 5 mg q6h per pharmacy. Patient does not have prolonged QT.   Darci Current, DO 11/27/2022, 12:01 PM PGY-1, [place] [facility] Service pager [phone]

## 2022-11-27 NOTE — Significant Event (Addendum)
Rapid Response Event Note   Reason for call Called to bedside as patient in 4point restraints, received multiple PRNs for agitation, HR sustaining 120s, pt not cooperating with staff, primary RN unable to leave room due to safety of patient.   Initial Focused Assessment Patient attempting to get out of bed, biting off restraints, biting staff, banging head on bed, kicking footboard. Cursing at staff members. De-escalation attempts with no or short improvement. Posey belt added at time of arrival.   *at time of arrival violent restraints initiated due to combative. Unable to take frequent vitals. HR reading 90s-120s, O2 probe 92-96%, ECG leads pulled off and unable to be reapplied. RR vary from 14-26.   Interventions/Plan of Care Primary team and psych to bedside PCCM consulted Multiple meds administered, see [facility] Transfer ICU 3M13  Event Summary:  MD Notified: Dinah Beers DO, M. Cinderella MD, CCM Call Time: [place] Time: 1137 End Time: 1245  Newman Nickels, RN

## 2022-11-27 NOTE — Progress Notes (Signed)
Has had 2 episodes of very severe agitation, banging his head on the bed rails and fighting restraints. Had to go back into 4 point restraints during one of these episodes. Requires large doses of multiple meds to control aggressive behavior. Remains on CO2 monitoring with levels in the mid-30s, RR in low to mid 20s. Due to hypokalemia, need for high dose APs, and history of difficult to control seizures, would prefer to use phenobarb with close respiratory monitoring than precedex. Also worry aobut bradycardia in a young patient that would require high doses of precedex to control.  Discussed with PharmD-- will schedule phenobarb and still have PRN antipsychotics and benzos as needed. Hopefully we will be able to safely liberate him from restraints soon.  Julian Hy, DO 11/27/22 6:39 PM [facility]  For contact information, see Amion. If no response to pager, please call PCCM consult pager. After hours, 7PM- 7AM, please call [place].

## 2022-11-27 NOTE — Progress Notes (Signed)
Arrived at room for EEG.  Patient disoriented and uncooperative.  RN aware. Will attempt EEG at a later time.  Ordering Neurologist notified.

## 2022-11-27 NOTE — Consult Note (Signed)
NAME:  Dylan Moon, MRN:  [id number], DOB:  1996-01-12, LOS: 0 ADMISSION DATE:  11/26/2022, CONSULTATION DATE:  11/27/2022 REFERRING MD:  Dr. Gwendlyn Deutscher , CHIEF COMPLAINT: Severe agitation   History of Present Illness:  Dylan Moon is a 27 year old male with past medical history significant for seizures, ADHD, bipolar, schizoaffective disease, scoliosis, and medical noncompliance who presented to the emergency department 3/5 for seizures, felt secondary to medication noncompliance.  Patient was admitted per family medicine teaching service with neurology consultation.  Mid morning of 3/6 patient progressively became more violent and agitated towards staff resulting in security being called, application of violent restraints, and eventually addition of chemical restraints as well. Given need for close monitoring of mentation and airway after chemical restraints PCCM was consulted    Pertinent  Medical History  Seizures, ADHD, bipolar, schizoaffective disease, scoliosis, and medical noncompliance  Significant Hospital Events: Including procedures, antibiotic start and stop dates in addition to other pertinent events   3/5 presented to ED after 1.5-minute tonic-clonic seizure per family 3/6 severely agitated and combative towards staff resulting in both violent and chemical restraints   Interim History / Subjective:  As above  Objective   Blood pressure 135/66, pulse 73, temperature 98.5 F (36.9 C), resp. rate 18, SpO2 98 %.        Intake/Output Summary (Last 24 hours) at 11/27/2022 1308 Last data filed at 11/27/2022 1239 Gross per 24 hour  Intake 240 ml  Output 1525 ml  Net -1285 ml   There were no vitals filed for this visit.  Examination: General: Acute ill appearing adult male lying in bed, in NAD HEENT: [place]/AT, MM pink/moist, PERRL,  Neuro: Severely agitated and combative, altered CV: s1s2 regular rate and rhythm, no murmur, rubs, or gallops,  PULM:  Clear to auscultation once  settled, no increased work of breathing, no added breath sounds  GI: soft, bowel sounds active in all 4 quadrants, non-tender, non-distended Extremities: warm/dry, no edema  Skin: no rashes or lesions  Resolved Hospital Problem list     Assessment & Plan:  Seizures with longstanding seizure history on dual AEDs -At baseline patient patient is on forecasted andeslicarbazepine P: Primary management per neurology Continue eslicarbazepine and Valproate   Seizure precautions Frequent neurochecks Optimize electrolytes CIWA protocol PRN benzodiazepines EEG   Aggressive behavior with suicide ideations  History of ADHD, bipolar, schizoaffective disorder -Home medications include for, Klonopin, and Lexapro P: Psychiatry following, appreciate assistance Violent restraints with close observation per protocol  Received Geodon, phenobarbital, and Versed prior to transfer due to severe agitation and safety to start Close monitoring in the ICU setting Frequent neurochecks Close monitoring of airway NPO  Safety sitter   Best Practice (right click and "Reselect all SmartList Selections" daily)   Diet/type: NPO DVT prophylaxis: LMWH GI prophylaxis: N/A Lines: N/A Foley:  N/A Code Status:  full code Last date of multidisciplinary goals of care discussion: Family updated at bedside  Labs   CBC: Recent Labs  Lab 11/26/22 1142 11/27/22 1006  WBC 11.6* 9.6  HGB 15.5 14.2  HCT 47.0 43.6  MCV 84.8 85.7  PLT 250 [id number]    Basic Metabolic Panel: Recent Labs  Lab 11/26/22 1148  NA 137  K 5.0  CL 103  CO2 22  GLUCOSE 83  BUN 6  CREATININE 1.20  CALCIUM 9.8  MG 2.2   GFR: Estimated Creatinine Clearance: 109.3 mL/min (by C-G formula based on SCr of 1.2 mg/dL). Recent Labs  Lab 11/26/22  1142 11/27/22 1006  WBC 11.6* 9.6    Liver Function Tests: Recent Labs  Lab 11/26/22 1148  AST 41  ALT 22  ALKPHOS 40  BILITOT 0.6  PROT 7.3  ALBUMIN 4.5   No results for  input(s): "LIPASE", "AMYLASE" in the last 168 hours. No results for input(s): "AMMONIA" in the last 168 hours.  ABG    Component Value Date/Time   PHART 7.414 07/11/2020 1650   PCO2ART 36.4 07/11/2020 1650   PO2ART 87.3 07/11/2020 1650   HCO3 22.8 07/11/2020 1650   TCO2 26 07/03/2020 0319   ACIDBASEDEF 1.1 07/11/2020 1650   O2SAT 96.1 07/11/2020 1650     Coagulation Profile: No results for input(s): "INR", "PROTIME" in the last 168 hours.  Cardiac Enzymes: No results for input(s): "CKTOTAL", "CKMB", "CKMBINDEX", "TROPONINI" in the last 168 hours.  HbA1C: Hgb A1c MFr Bld  Date/Time Value Ref Range Status  12/24/2019 05:13 PM 5.8 (H) 4.8 - 5.6 % Final    Comment:    (NOTE) Pre diabetes:          5.7%-6.4% Diabetes:              >6.4% Glycemic control for   <7.0% adults with diabetes     CBG: Recent Labs  Lab 11/26/22 1344  GLUCAP 88    Review of Systems:   Unable to assess   Past Medical History:  He,  has a past medical history of ADHD, Bipolar 1 disorder ([place]), Schizoaffective disorder ([place]), Scoliosis, and Seizures ([place]).   Surgical History:   Past Surgical History:  Procedure Laterality Date   NO PAST SURGERIES       Social History:   reports that he has been smoking cigars. He has never used smokeless tobacco. He reports current drug use. Drug: Marijuana. He reports that he does not drink alcohol.   Family History:  His family history includes Cancer in an other family member; Diabetes in his father and mother; Hypertension in his mother; Seizures in his maternal grandfather.   Allergies Allergies  Allergen Reactions   Keppra [Levetiracetam] Other (See Comments)    Irritability    Tramadol Other (See Comments)    Contraindicated with current medications (??)   Vimpat [Lacosamide] Other (See Comments)    Causes anger     Home Medications  Prior to Admission medications   Medication Sig Start Date End Date Taking? Authorizing Provider   APTIOM 800 MG TABS Take 1,600 mg by mouth daily. 07/20/20  Yes Antonieta Pert, MD  valproic acid (DEPAKENE) 250 MG/5ML solution Take 20 mLs by mouth in the morning and at bedtime. 08/20/21  Yes [provider]  busPIRone (BUSPAR) 10 MG tablet Take 1 tablet (10 mg total) by mouth 2 (two) times daily. Patient not taking: Reported on 11/26/2022 07/18/20   Antonieta Pert, MD  carvedilol (COREG) 6.25 MG tablet Take 1 tablet (6.25 mg total) by mouth 2 (two) times daily with a meal. Patient not taking: Reported on 11/26/2022 07/20/20 09/13/21  Antonieta Pert, MD  clonazePAM (KLONOPIN) 0.5 MG disintegrating tablet Take 0.5 mg by mouth daily as needed for anxiety. Patient not taking: Reported on 11/26/2022 12/12/20   [provider]  divalproex (DEPAKOTE ER) 500 MG 24 hr tablet Take 4 tablets (2,000 mg total) by mouth daily. Patient not taking: Reported on 11/26/2022 07/20/20 09/13/21  Antonieta Pert, MD  escitalopram (LEXAPRO) 10 MG tablet Take 1 tablet (10 mg total) by mouth at bedtime. Patient not taking: Reported on  11/26/2022 07/19/20   Starkes-Perry, Gayland Curry, FNP  NAYZILAM 5 MG/0.1ML SOLN Place 5 mg into the nose daily as needed. Patient not taking: Reported on 11/26/2022 04/17/21   Wyvonnia Dusky, MD  Paliperidone Palmitate ER (INVEGA TRINZA) 546 MG/1.75ML SUSY Inject 1.8 mLs (561 mg total) into the muscle every 3 (three) months. Patient not taking: Reported on 11/26/2022 12/11/21   Lucky Rathke, FNP     Critical care time: 45 mins   Performed by: Genola Yuille D. Harris  Total critical care time: 45 minutes  Critical care time was exclusive of separately billable procedures and treating other patients.  Critical care was necessary to treat or prevent imminent or life-threatening deterioration.  Critical care was time spent personally by me on the following activities: development of treatment plan with patient and/or surrogate as well as nursing, discussions with consultants, evaluation of patient's  response to treatment, examination of patient, obtaining history from patient or surrogate, ordering and performing treatments and interventions, ordering and review of laboratory studies, ordering and review of radiographic studies, pulse oximetry and re-evaluation of patient's condition.  Malinda Mayden D. Harris, NP-C [facility] Personal contact information can be found on Amion  If no contact or response made please call [phone] 11/27/2022, 1:31 PM

## 2022-11-27 NOTE — Consult Note (Addendum)
I have independently evaluated the patient during a face-to-face assessment on 3/6. I reviewed the patient's chart, and I participated in key portions of the service. I discussed the case with the [employer], and I agree with the assessment and plan of care as documented in the House Officer's note.   Have made minor changes to body of note below.  When I saw pt repeatedly asking for promethazine, codeine. I met with mother and girlfriend during middle of agitation (wanted to debrief as large # of security outside the room). Mother very fixed on ADHD/PTSD as diagnoses which do not explain behaviors.   Dylan Fruit, MD   [facility] Face-to-Face Psychiatric Evaluation   Service Date: November 27, 2022 LOS:  LOS: 0 days  Reason for Consult: "suicidal attempt" Consult by: Donney Dice  Assessment  Dylan Moon is a 27 y.o. male with hx of PTSD, ADHD, ?schizoaffective disorder, depression, anxiety, and seizures, admitted medically for 11/26/2022 11:30 AM for seizures.   Patient was assessed on several instances during the day. He was generally very agitated and violent, notably biting multiple staff members, tearing off restraints, shouting expletives ("take your Jamaican/Mexican ass out of here"), and not complying with staff redirection. After multiple attempts to assist patient via verbal redirection and aggression. He continued to escalate despite verbal redirection, multiple doses of versed, 5 mg of olanzapine, 20 mg of geodon, and 5 mg of haloperidol. Patient broke the bed, required multiple staff and security to restrain patient in order to apply appropriate restraints. Rapid response was called and patient was transferred to ICU and started on phenobarbital.  Most of history of patient that I obtained was from girlfriend he lives with who states his present behaviors are very unusual for him. She does note patient has gone 2-3 days without sleep since last Tuesday  after having a seizure as a neurologist had previously informed him that people with epilepsy can suddenly "pass away" when they sleep. However, girlfriend also notes patient is usually very hyper, impulsive, with pressured speech which is concerning for bipolar disorder vs schizoaffective disorder vs postictal psychosis. Could also consider substance induced psychosis given sharp contrast in behavior in past few days compared to other instances where patient has had seizures.   Given his multiple escalations of verbal and physical aggression, we recommended haloperidol 10 mg PO/IV bid, ativan 2 mg q8h, and geodon 10 mg q6h prn. He currently is on valproic acid for seizure but can also concomitantly act as a mood stabilizer  and can be effective in terating agitation. We will continue to follow and adjust psychotropics as needed. He is currently under IVC and would strongly recommend continuing it along with 1:1 sitter. At this point given confusion and agitation at least partially due to postictal state, cannot determine if inpt psych will benefit pt. Will reassess this daily    Plan  ## Safety and Observation Level:  - Based on my clinical evaluation, I estimate the patient to be at high risk of self harm in the current setting. Recommend 1:1 sitter and restraints as needed.  #Severe Agitation #Seizures #hx of PTSD #hx of ADHD Bipolar Disorder vs Postictal psychosis vs schizoaffective disorder vs substance induced psychosis -Geodon 10 mg IV q6h prn -Ativan 2 mg IV or PO q8h scheduled -Haldol 10 mg PO or IV q12h scheduled -Defer depcaon and phenobarbital dosing to primary and neurology -Consider CK given he has been in restraints extensively -1:1 sitter Pertinent labs: ECG  11/27/22 NSR Qtc 437, mild increase in Cr 1.2>1.48, CBC wnl  UDS pending  ## Disposition:  -- pending  Thank you for this consult request. Recommendations have been communicated to the primary team.  We will continue to  follow at this time.   France Ravens, MD   Relevant History  Relevant Aspects of Hospital Course:  Admitted on 11/26/2022 for seizures 3/6 rapid called due to severe agitation. Admitted to ICU and started on phenobarbital.  Patient Report:  Limited history obtained due to agitation. He shouted multiple expletives, kicking and shouting while being restrained. He did state he did not feel he needed to be hospitalized and that his mother would vouch for this. He did state he had been here 10 days despite only just being admitted yesterday.  Collateral information:  Celedonio Savage (girlfriend)  Patient has been living with girlfriend for ~3 years. She states patient is restless and fidgety at baseline but is not this agitated. She states patient deals with anxiety and will become irritable whenever discussing medications, doctor's appointments, or police. She notes after having a seizure, he does become more agitated but is able to "sleep it off". She is concerned this time given he  She denies observing patient responding to internal stimuli or expressing hallucinations. She does note patient has gone 2-3 days without sleep and appearing "hyper" where he will have pressured speech, increased impulsivity, and increased irritability. She thinks this may be due to paranoia after being told by a neurologist that people with epilepsy "pass away in their sleep" which lead to patient worrying he will die if he falls asleep. Girlfriend states he will have these episodes of hypomania 2-3 times per month.   She states he had 2 convulsive seizures yesterday prior to admission and 2 absence seizures while being in hospital. He had another convulsive seizure last Tuesday which led to him not sleeping for 2-3 days. Patient jumping out of 2nd floor window yesterday was very uncharacteristic of him but girlfriend believes this was not an attempt at suicide but an attempt to get away from paramedics.   Psychiatric History:   Never been psychiatrically hospitalized. He did establish care with Dr. Hassell Done at [facility] who diagnosed patient with Cannabis use disorder, GAD w/ panic attacks, and PTSD and he as started on mirtazapine.   Social History:  Tobacco use: 3 black and mild per day Alcohol use: denies Drug use: Vape THC and smokes 3-4 joints of marijuana per day  Family History:  The patient's family history includes Cancer in an other family member; Diabetes in his father and mother; Hypertension in his mother; Seizures in his maternal grandfather.  Medical History: Past Medical History:  Diagnosis Date   ADHD    Bipolar 1 disorder ([place])    Schizoaffective disorder ([place])    Scoliosis    Seizures ([place])    most recent 12/02/17    Surgical History: Past Surgical History:  Procedure Laterality Date   NO PAST SURGERIES      Medications:   Current Facility-Administered Medications:    acetaminophen (TYLENOL) tablet 650 mg, 650 mg, Oral, Q4H PRN, Noemi Chapel P, DO   docusate sodium (COLACE) capsule 100 mg, 100 mg, Oral, BID PRN, Julian Hy, DO   [START ON 11/28/2022] enoxaparin (LOVENOX) injection 40 mg, 40 mg, Subcutaneous, Daily, Carlis Abbott, Laura P, DO   Eslicarbazepine Acetate TABS 1,600 mg, 1,600 mg, Oral, Daily, Kerney Elbe, MD   haloperidol (HALDOL) tablet 10 mg,  10 mg, Oral, BID **OR** haloperidol lactate (HALDOL) injection 7.5 mg, 7.5 mg, Intravenous, BID, France Ravens, MD, 7.5 mg at 11/27/22 1446   lactated ringers infusion, , Intravenous, Continuous, Julian Hy, DO, Last Rate: 125 mL/hr at 11/27/22 1524, New Bag at 11/27/22 1524   LORazepam (ATIVAN) tablet 2 mg, 2 mg, Oral, Q8H **OR** LORazepam (ATIVAN) injection 2 mg, 2 mg, Intravenous, Q8H, France Ravens, MD   LORazepam (ATIVAN) injection 4 mg, 4 mg, Intravenous, PRN, Eppie Gibson, MD   midazolam (VERSED) injection 2-4 mg, 2-4 mg, Intramuscular, Q1H PRN, Noemi Chapel P, DO   midazolam (VERSED) injection 2-4 mg, 2-4 mg,  Intravenous, Q1H PRN, 4 mg at 11/27/22 1536 **OR** midazolam (VERSED) injection 2-4 mg, 2-4 mg, Intramuscular, Q1H PRN, Noemi Chapel P, DO   polyethylene glycol (MIRALAX / GLYCOLAX) packet 17 g, 17 g, Oral, Daily PRN, Noemi Chapel P, DO   potassium chloride 10 mEq in 100 mL IVPB, 10 mEq, Intravenous, Q1 Hr x 4, Julian Hy, DO, Last Rate: 100 mL/hr at 11/27/22 1525, 10 mEq at 11/27/22 1525   valproate (DEPACON) 1,000 mg in dextrose 5 % 50 mL IVPB, 1,000 mg, Intravenous, Q12H, Kerney Elbe, MD, Stopped at 11/27/22 1409   white petrolatum (VASELINE) gel, , Topical, PRN, Noemi Chapel P, DO   ziprasidone (GEODON) injection 10 mg, 10 mg, Intramuscular, Q6H PRN, France Ravens, MD, 10 mg at 11/27/22 1223  Allergies: Allergies  Allergen Reactions   Keppra [Levetiracetam] Other (See Comments)    Irritability    Tramadol Other (See Comments)    Contraindicated with current medications (??)   Vimpat [Lacosamide] Other (See Comments)    Causes anger       Objective  Vital signs:  Temp:  [97.9 F (36.6 C)-100.2 F (37.9 C)] 100.2 F (37.9 C) (03/06 1600) Pulse Rate:  [59-133] 82 (03/06 1615) Resp:  [13-31] 22 (03/06 1615) BP: (101-169)/(56-142) 121/65 (03/06 1615) SpO2:  [96 %-100 %] 100 % (03/06 1615)  Psychiatric Specialty Exam:  Presentation  General Appearance: Disheveled Eye Contact:poor Speech: coherent and expletive Speech Volume:loud  Mood and Affect  Mood: did not inquire Affect: irritable, agitated, anxious, labile  Thought Process  Thought Processes: disorganzied Descriptions of Associations: tangential Orientation: not assessed Thought Content: perseverative on discharging Hallucinations: does not appear to be overtly responding to internal stimuli Ideas of Reference: persecutory, paranoid Suicidal Thoughts: not assessed Homicidal Thoughts: threatened multiple staff members  Sensorium  Memory:not formally assessed Judgment: impaired Insight: poor  Executive  Functions  Concentration: poor Attention Span:poor Recall:poor Fund of Knowledge: fair Language: fair  Psychomotor Activity  Psychomotor Activity:increased   Sleep  Sleep:poor

## 2022-11-27 NOTE — Progress Notes (Signed)
OT Cancellation Note  Patient Details Name: Dylan Moon MRN: [id number] DOB: 05-12-1996   Cancelled Treatment:    Reason Eval/Treat Not Completed: Patient not medically ready;Other (comment) (Per Nursing staff and observation of pt this morning, pt currently agitated and in 4 point restraints attempting to leave the bed. RN staff asking therapy to hold at the moment. Will f/u for assessment when pt is more calm and able to participate.)  Almyra Deforest, OTR/L 11/27/2022, 7:58 AM

## 2022-11-27 NOTE — Progress Notes (Signed)
PCCM Progress Note -late entry patient was first assessed at 1227  Face to face encounter was preformed while patient was on [address] [place]. Patient was seen in violent restraints with 3 security officers and multiple nursing staff at bedside trying to assist patient. Prior to application of restraints multiple interventions had been attempted to de-escalate the behavior including family involvement and medication administration with continued safety issues. Patient continued to be extremely violent and combative putting both patient and staff safety at risk. Therefore, chemical restraints were started and patient was transferred to the intensive care unit as soon as patient could safely be moved.   Earland Reish D. Harris, NP-C [facility] Personal contact information can be found on Amion  If no contact or response made please call [phone] 11/27/2022, 2:28 PM

## 2022-11-27 NOTE — Progress Notes (Signed)
eLink Physician-Brief Progress Note Patient Name: Dylan Moon DOB: 1995/12/06 MRN: [id number]   Date of Service  11/27/2022  HPI/Events of Note  2 episodes of very severe agitation, banging his head on the bed rails and fighting restraints   Currently calm with 4 points in place  eICU Interventions  Appropriate to renew for now   0033-  Oliguria with Bladder scan = 476 ml.; order for in/out cath  0120 -reassessed for restraints, still appropriate as he is quite agitated  0430 - ongoing restraint need  Intervention Category Minor Interventions: Agitation / anxiety - evaluation and management  Altheia Shafran 11/27/2022, 9:21 PM

## 2022-11-27 NOTE — Progress Notes (Signed)
     Daily Progress Note Intern Pager: [phone]  Patient name: Dylan Moon Medical record number: [id number] Date of birth: 1996/02/17 Age: 27 y.o. Gender: male  Primary Care Provider: [facility], [facility]: Neurology Psychiatry Code Status: Full  Pt Overview and Major Events to Date:  -Admitted 3/5  Assessment and Plan: Kalai E Halpert is a 27 y.o. male admitted for seizures due to non-compliance.  He continues to have agitation and violence towards staff members in the setting of possible psychosis and/or seizure contribution.  Pertinent PMH/PSH includes seizure disorder, schizoaffective disorder, Bipolar 1, depression, ADHD.   See nursing note for further details on violence.  Multiple interventions ordered by primary team, psychiatry, neurology for prevention of harm to staff.  Rapid response called to which team responded.  See note from Dr. Sabra Heck. Patient to be transferred to ICU.  * Seizure ([place]) No seizures overnight. Ethanol level negative.  -neurology consulted, appreciate recs -s/p keppra and valproic acid loading dose  -Continue Eslicarbazepine and Depakene -Seizure rescue per neuro and ICU. -seizure precautions -neuro checks -CMP, Mg pending -pending UA  Suicidal ideation Currently under IVC, ends 3/12. -psych consulted, appreciate recs and involvement as patient may benefit from inpatient psychiatry once medically stable -suicide precautions  Aggressive behavior Remains harmful to staff overnight. Required physical and chemical restraints.  -Transferred to ICU for sedation. -continue restraints with continuous reevaluation and remove as appropriate   FEN/GI: NPO PPx: Lovenox Dispo: Pending clinical improvement  Subjective:  Patient agitated on MD visit to room.  Security present, nurses in process of securing while in restraints.  Per nursing note patient made several verbal threats.  While in room patient attempting to undo  restraints with teeth and made several verbal threats to people in the room.  Patient unwilling to calm down.  Not answering medical questions.  Objective: Temp:  [97.9 F (36.6 C)-98.7 F (37.1 C)] 98.5 F (36.9 C) (03/06 0215) Pulse Rate:  [59-93] 73 (03/06 0727) Resp:  [13-31] 18 (03/06 0727) BP: (128-169)/(60-142) 135/66 (03/06 0727) SpO2:  [96 %-100 %] 98 % (03/06 0727) Physical Exam: General: NAD, agitated, attempting to undo restraints Neuro: alert, agitated Cardiovascular: unsafe to examine Respiratory: normal WOB on RA Abdomen: unsafe to examine Extremities: Moving all 4 extremities equally   Laboratory: Most recent CBC Lab Results  Component Value Date   WBC 9.6 11/27/2022   HGB 14.2 11/27/2022   HCT 43.6 11/27/2022   MCV 85.7 11/27/2022   PLT 229 11/27/2022   Most recent BMP    Latest Ref Rng & Units 11/26/2022   11:48 AM  BMP  Glucose 70 - 99 mg/dL 83   BUN 6 - 20 mg/dL 6   Creatinine 0.61 - 1.24 mg/dL 1.20   Sodium 135 - 145 mmol/L 137   Potassium 3.5 - 5.1 mmol/L 5.0   Chloride 98 - 111 mmol/L 103   CO2 22 - 32 mmol/L 22   Calcium 8.9 - 10.3 mg/dL 9.8     Other pertinent labs: none   Imaging/Diagnostic Tests: No new imaging.  Salvadore Oxford, MD 11/27/2022, 12:27 PM  PGY-1, [place] Intern pager: [phone], text pages welcome Secure chat group [place]

## 2022-11-27 NOTE — Progress Notes (Signed)
PCCM Progress Note   Patient reassessed in 4 point violent restraints. Initially he was calm for approximately 52mns at which time restraints were removed. Unfortunately he began to slowly become combative and violent again. Patient continues to remain a safety risk to himself and staff, therefore violent restrains reordered and reapplied. Patient was also prescribed additional chemical restraint.   Dylan Moon D. Harris, NP-C [facility] Personal contact information can be found on Amion  If no contact or response made please call [phone] 11/27/2022, 5:30 PM

## 2022-11-27 NOTE — Progress Notes (Signed)
Still unable to obtain EEG at this time. Will let ordering Neurologist know we may need to cancel this order until further notice.
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
## 2022-11-27 NOTE — Progress Notes (Addendum)
Subjective: Agitated and combative. In 4 point restraints. Exhibiting speech patterns consistent with paranoid ideation. Making threatening comments to staff.   Objective: Current vital signs: BP 135/66   Pulse 73   Temp 98.5 F (36.9 C)   Resp 18   SpO2 98%  Vital signs in last 24 hours: Temp:  [97.9 F (36.6 C)-98.7 F (37.1 C)] 98.5 F (36.9 C) (03/06 0215) Pulse Rate:  [59-93] 73 (03/06 0727) Resp:  [13-31] 18 (03/06 0727) BP: (128-169)/(60-142) 135/66 (03/06 0727) SpO2:  [96 %-100 %] 98 % (03/06 0727)  Intake/Output from previous day: 03/05 0701 - 03/06 0700 In: 240 [P.O.:240] Out: 1000 [Urine:1000] Intake/Output this shift: No intake/output data recorded. Nutritional status:  Diet Order             Diet NPO time specified  Diet effective now                  HEENT: [place]/AT Lungs: Respirations unlabored Ext: No edema. In 4-point restraints.   Neurologic Exam: Ment: Awake and alert. Agitated and combative. Moving all limbs and sitting up intermittently in bed exhibiting attempts to exit restraints. Speech is fluent but with paranoid ideation and tangentiality. No dysarthria noted. Not cooperative with exam. Making threatening comments to staff.  CN: Eyes are conjugate. EOMI. Face symmetric. Phonation intact without hoarseness or hypophonia.  Motor: Observation of movements with attempts to exit restraints are consistent with 5/5 strength x 4, with no asymmetry.  Sensory: Not cooperative.  Reflexes: Not cooperative Cerebellar: Not cooperative Gait: Unable to assess Other: No jerking, twitching, facial automatisms, unresponsiveness, forced gaze deviation or other seizure activity seen.   Lab Results: Results for orders placed or performed during the hospital encounter of 11/26/22 (from the past 48 hour(s))  Valproic Acid (depakote) Level (if patient is taking this medication)     Status: None   Collection Time: 11/26/22 11:42 AM  Result Value Ref Range    Valproic Acid Lvl 85 50.0 - 100.0 ug/mL    Comment: Performed at [facility], [address], [place],  [address]  CBC - if new onset seizures     Status: Abnormal   Collection Time: 11/26/22 11:42 AM  Result Value Ref Range   WBC 11.6 (H) 4.0 - 10.5 K/uL   RBC 5.54 4.22 - 5.81 MIL/uL   Hemoglobin 15.5 13.0 - 17.0 g/dL   HCT 47.0 39.0 - 52.0 %   MCV 84.8 80.0 - 100.0 fL   MCH 28.0 26.0 - 34.0 pg   MCHC 33.0 30.0 - 36.0 g/dL   RDW 13.2 11.5 - 15.5 %   Platelets 250 150 - 400 K/uL   nRBC 0.0 0.0 - 0.2 %    Comment: Performed at [facility], [place] [address], [place],  [address]  Comprehensive metabolic panel     Status: None   Collection Time: 11/26/22 11:48 AM  Result Value Ref Range   Sodium 137 135 - 145 mmol/L   Potassium 5.0 3.5 - 5.1 mmol/L   Chloride 103 98 - 111 mmol/L   CO2 22 22 - 32 mmol/L   Glucose, Bld 83 70 - 99 mg/dL    Comment: Glucose reference range applies only to samples taken after fasting for at least 8 hours.   BUN 6 6 - 20 mg/dL   Creatinine, Ser 1.20 0.61 - 1.24 mg/dL   Calcium 9.8 8.9 - 10.3 mg/dL   Total Protein 7.3 6.5 - 8.1 g/dL  Albumin 4.5 3.5 - 5.0 g/dL   AST 41 15 - 41 U/L   ALT 22 0 - 44 U/L   Alkaline Phosphatase 40 38 - 126 U/L   Total Bilirubin 0.6 0.3 - 1.2 mg/dL   GFR, Estimated >60 >60 mL/min    Comment: (NOTE) Calculated using the CKD-EPI Creatinine Equation (2021)    Anion gap 12 5 - 15    Comment: Performed at [facility] [address] [place], [place] [address]  Magnesium     Status: None   Collection Time: 11/26/22 11:48 AM  Result Value Ref Range   Magnesium 2.2 1.7 - 2.4 mg/dL    Comment: Performed at [facility] [address] [place], [place] [address]  CBG monitoring, ED     Status: None   Collection Time: 11/26/22  1:44 PM  Result Value Ref Range   Glucose-Capillary 88 70 - 99 mg/dL    Comment: Glucose reference range applies only to samples taken after fasting for at least  8 hours.  HIV Antibody (routine testing w rflx)     Status: None   Collection Time: 11/26/22  9:14 PM  Result Value Ref Range   HIV Screen 4th Generation wRfx Non Reactive Non Reactive    Comment: Performed at [facility], [place] [address], [place], [place] [address]  Ethanol     Status: None   Collection Time: 11/26/22  9:14 PM  Result Value Ref Range   Alcohol, Ethyl (B) <10 <10 mg/dL    Comment: (NOTE) Lowest detectable limit for serum alcohol is 10 mg/dL.  For medical purposes only. Performed at [facility], [place] [address] [place], [place] [address]     No results found for this or any previous visit (from the past 240 hour(s)).  Lipid Panel No results for input(s): "CHOL", "TRIG", "HDL", "CHOLHDL", "VLDL", "LDLCALC" in the last 72 hours.  Studies/Results: No results found.  Medications: Scheduled:  enoxaparin (LOVENOX) injection  40 mg Subcutaneous [id number]   Eslicarbazepine Acetate  1,600 mg Oral Daily   valproic acid  1,000 mg Oral BID   Assessment: Patient with longstanding history of seizures on dual AEDs (valproic acid and eslicarbazepine acetate) admitted for witnessed tonic-clinic seizure lasting approximately 1.5 minutes in the setting of recent history of poor sleep, work stressors, and inconsistent medication adherence (valproate level was notably at therapeutic range on admission, despite girlfriend endorsing recent poor compliance).  - Exam this morning reveals patient to be combative, agitated and uncooperative with paranoid ideation.  - No clinically apparent seizures since he presented to the ED. However, subclinical frontal lobe seizures are on the DDx and will need to be assessed with EEG.  - It would be beneficial to involve psychiatry to help restart patient on antipsychotic medications (not currently taking any) to address any potential impairments in executive functioning that may contribute to medication non-adherence to AEDs and possible manic  phase contributing to sleep deprivation. - Patient highly combative yesterday and EEG technician was unable to place leads yesterday. He continues to be agitated but has received a dose of Versed followed by Zyprexa during assessment this AM and may be able to tolerate EEG afterwards.  - Breakthrough seizures are most likely secondary to partial medication compliance. Likely was compliant with Depakene, however, as level on admission was therapeutic.     Recommendations: - Switching Depakene to IV valproic acid 1000 mg BID  - Continue Aptiom 1,600 mg daily.  This cannot be given IV. Consider crushing and mixing with pudding or applesauce.  - From a neurological standpoint, there does not appear to be any indication for keeping the patient NPO - Ativan PRN seizure recurrence - Psychiatry consult is pending. Primary reasons for consult are to address concerns for mania and psychosis, aggression, and medication non-adherence. He currently is exhibiting speech patterns suggestive of paranoid ideation.  - EEG to rule out subclinical seizure activity has been ordered, as frontal lobe seizures can potentially present with behavioral abnormalities.  - Inpatient seizure precautions   LOS: 0 days   '@Electronically'$  signed: Dr. Kerney Elbe 11/27/2022  9:43 AM

## 2022-11-28 ENCOUNTER — Inpatient Hospital Stay (HOSPITAL_COMMUNITY): Payer: Medicaid Other

## 2022-11-28 DIAGNOSIS — N179 Acute kidney failure, unspecified: Secondary | ICD-10-CM | POA: Diagnosis not present

## 2022-11-28 DIAGNOSIS — R569 Unspecified convulsions: Secondary | ICD-10-CM | POA: Diagnosis not present

## 2022-11-28 DIAGNOSIS — F23 Brief psychotic disorder: Secondary | ICD-10-CM

## 2022-11-28 LAB — CBC
HCT: 42 % (ref 39.0–52.0)
Hemoglobin: 14.2 g/dL (ref 13.0–17.0)
MCH: 28.4 pg (ref 26.0–34.0)
MCHC: 33.8 g/dL (ref 30.0–36.0)
MCV: 84 fL (ref 80.0–100.0)
Platelets: 206 10*3/uL (ref 150–400)
RBC: 5 MIL/uL (ref 4.22–5.81)
RDW: 13.4 % (ref 11.5–15.5)
WBC: 10 10*3/uL (ref 4.0–10.5)
nRBC: 0 % (ref 0.0–0.2)

## 2022-11-28 LAB — MAGNESIUM: Magnesium: 1.9 mg/dL (ref 1.7–2.4)

## 2022-11-28 LAB — BASIC METABOLIC PANEL
Anion gap: 11 (ref 5–15)
BUN: 6 mg/dL (ref 6–20)
CO2: 24 mmol/L (ref 22–32)
Calcium: 9 mg/dL (ref 8.9–10.3)
Chloride: 106 mmol/L (ref 98–111)
Creatinine, Ser: 1.08 mg/dL (ref 0.61–1.24)
GFR, Estimated: >60 mL/min (ref >60)
Glucose, Bld: 87 mg/dL (ref 70–99)
Potassium: 3.6 mmol/L (ref 3.5–5.1)
Sodium: 141 mmol/L (ref 135–145)

## 2022-11-28 LAB — PHOSPHORUS: Phosphorus: 3.6 mg/dL (ref 2.5–4.6)

## 2022-11-28 LAB — GLUCOSE, CAPILLARY: Glucose-Capillary: 88 mg/dL (ref 70–99)

## 2022-11-28 MED ORDER — ZIPRASIDONE MESYLATE 20 MG IM SOLR
10.0000 mg | Freq: Once | INTRAMUSCULAR | Status: DC
  Filled 2022-11-28: qty 20

## 2022-11-28 MED ORDER — HALOPERIDOL LACTATE 5 MG/ML IJ SOLN
10.0000 mg | Freq: Three times a day (TID) | INTRAMUSCULAR | Status: DC
  Administered 2022-11-28 – 2022-11-29 (×3): 10 mg via INTRAVENOUS
  Filled 2022-11-28 (×4): qty 2

## 2022-11-28 MED ORDER — HALOPERIDOL 5 MG PO TABS
10.0000 mg | ORAL_TABLET | Freq: Three times a day (TID) | ORAL | Status: DC
  Filled 2022-11-28 (×5): qty 2

## 2022-11-28 MED ORDER — ZIPRASIDONE MESYLATE 20 MG IM SOLR
10.0000 mg | INTRAMUSCULAR | Status: DC | PRN
  Administered 2022-11-29 – 2022-11-30 (×2): 10 mg via INTRAMUSCULAR
  Filled 2022-11-28 (×2): qty 20

## 2022-11-28 MED ORDER — OXCARBAZEPINE 300 MG PO TABS
750.0000 mg | ORAL_TABLET | Freq: Two times a day (BID) | ORAL | Status: DC
  Filled 2022-11-28 (×3): qty 1

## 2022-11-28 MED ORDER — CHLORHEXIDINE GLUCONATE CLOTH 2 % EX PADS
6.0000 | MEDICATED_PAD | Freq: Every day | CUTANEOUS | Status: DC
  Administered 2022-11-28: 6 via TOPICAL

## 2022-11-28 MED ORDER — DIPHENHYDRAMINE HCL 50 MG/ML IJ SOLN
50.0000 mg | Freq: Once | INTRAMUSCULAR | Status: AC
  Administered 2022-11-28: 50 mg via INTRAVENOUS
  Filled 2022-11-28: qty 1

## 2022-11-28 MED ORDER — CLONIDINE HCL 0.1 MG/24HR TD PTWK
0.1000 mg | MEDICATED_PATCH | TRANSDERMAL | Status: DC
  Administered 2022-11-28: 0.1 mg via TRANSDERMAL
  Filled 2022-11-28: qty 1

## 2022-11-28 MED ORDER — DIPHENHYDRAMINE HCL 50 MG/ML IJ SOLN
50.0000 mg | Freq: Four times a day (QID) | INTRAMUSCULAR | Status: DC | PRN
  Administered 2022-11-28 – 2022-12-06 (×12): 50 mg via INTRAVENOUS
  Filled 2022-11-28 (×12): qty 1

## 2022-11-28 MED ORDER — MAGNESIUM SULFATE 2 GM/50ML IV SOLN
2.0000 g | Freq: Once | INTRAVENOUS | Status: AC
  Administered 2022-11-28: 2 g via INTRAVENOUS
  Filled 2022-11-28: qty 50

## 2022-11-28 MED ORDER — POTASSIUM CHLORIDE 10 MEQ/100ML IV SOLN
10.0000 meq | INTRAVENOUS | Status: AC
  Administered 2022-11-28 (×4): 10 meq via INTRAVENOUS
  Filled 2022-11-28 (×3): qty 100

## 2022-11-28 MED ORDER — KETAMINE HCL 50 MG/5ML IJ SOSY
1.0000 mg/kg | PREFILLED_SYRINGE | Freq: Once | INTRAMUSCULAR | Status: DC | PRN
  Administered 2022-11-28: 85 mg via INTRAVENOUS
  Filled 2022-11-28 (×3): qty 10

## 2022-11-28 NOTE — Progress Notes (Signed)
PT Cancellation Note  Patient Details Name: Dylan Moon MRN: [id number] DOB: [birth year]   Cancelled Treatment:    Reason Eval/Treat Not Completed: Patient not medically ready. Pt in restraints due to agitation and aggressive behavior. Will monitor for appropriateness.    [place] 11/28/2022, 9:08 AM [place] Office [phone]

## 2022-11-28 NOTE — Care Plan (Signed)
LTM EEG reviewed till 32. No seizures. EEG showed generalized beta activity. Left>right frontotemporal sharp waves were noted. Please review final report for details.  Carey Johndrow Barbra Sarks

## 2022-11-28 NOTE — Progress Notes (Signed)
[facility] ADULT ICU REPLACEMENT PROTOCOL   The patient does apply for the [facility] Adult ICU Electrolyte Replacment Protocol based on the criteria listed below:   1.Exclusion criteria: TCTS, ECMO, Dialysis, and Myasthenia Gravis patients 2. Is GFR >/= 30 ml/min? Yes.    Patient's GFR today is >60 3. Is SCr </= 2? Yes.   Patient's SCr is 1.08 mg/dL 4. Did SCr increase >/= 0.5 in 24 hours? No. 5.[name] weight >40kg  Yes.   6. Abnormal electrolyte(s):   K 3.6, Mg 1.9  7. Electrolytes replaced per protocol 8.  Call MD STAT for K+ </= 2.5, Phos </= 1, or Mag </= 1 Physician:  A. Danice Goltz R Jolee Critcher 11/28/2022 4:56 AM

## 2022-11-28 NOTE — Progress Notes (Signed)
Late entry note. Patient re-evaluated 11:45, still very volatile. Still unfortunately needs hard restraints.  Julian Hy, DO 11/28/22 1:20 PM [facility]  For contact information, see Amion. If no response to pager, please call PCCM consult pager. After hours, 7PM- 7AM, please call Elink.

## 2022-11-28 NOTE — Progress Notes (Signed)
vLtm  setup  All impedances below 10kohms  [facility] to monitor   Patient event  button tested and working

## 2022-11-28 NOTE — TOC Progression Note (Signed)
Transition of Care [facility]) - Initial/Assessment Note    Patient Details  Name: Dylan Moon MRN: [id number] Date of Birth: [birth year]  Transition of Care [facility]) CM/SW Contact:    Milinda Antis, [place] Phone Number: 11/28/2022, 4:42 PM  Clinical Narrative:                 TOC following patient for  d/c planning needs once medically stable.  Patient is from home with girlfriend and is currently IVC'd.  IVC expires on 12/03/2022.    Lind Covert, MSW, LCSW  Expected Discharge Plan: [facility]     Patient Goals and CMS Choice            Expected Discharge Plan and Services                                              Prior Living Arrangements/Services                       Activities of Daily Living      Permission Sought/Granted                  Emotional Assessment              Admission diagnosis:  Seizure [facility]) [R56.9] Patient Active Problem List   Diagnosis Date Noted   Suicidal behavior with attempted self-injury ([place]) 11/26/2022   Suicidal ideation 11/26/2022   Schizoaffective disorder, bipolar type ([place]) 08/09/2020   Unspecified intellectual disabilities 08/09/2020   Family discord 03/15/2020   Aggressive behavior 03/15/2020   HCAP (healthcare-associated pneumonia) 12/27/2019   Status epilepticus ([place]) 12/24/2019   Acute respiratory failure with hypoxemia ([place])    Seizures ([place]) 07/10/2018   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   Noncompliance with medication regimen 12/11/2017   Seizure ([place]) 05/24/2016   Dehydration, mild 05/24/2016   AKI (acute kidney injury) ([place]) 05/24/2016   Marijuana use 05/24/2016   PCP:  [facility], [facility]:   [id number] [place], [email or web address]: [phone]: [phone]    Social Determinants of Health (SDOH) Social History: SDOH  Screenings   Depression (PHQ2-9): Low Risk  (11/13/2021)  Tobacco Use: High Risk (11/27/2022)   SDOH Interventions:     Readmission Risk Interventions     No data to display

## 2022-11-28 NOTE — Progress Notes (Signed)
LTM maint complete - no skin breakdown leads untangled [facility] monitored, Event button test confirmed by [facility].

## 2022-11-28 NOTE — Progress Notes (Addendum)
Subjective: Somnolent/sedated and in 4 point restraints.   New history obtained by Pharmacist: Telephone note from 06/22/2019 documents that he was doing okay on valproate monotherapy until he was hospitalized for a seizure in 05/2019 - the neurologist's listed choices for next medication was vimpat or eslicarbazepine; they selected eslicarbazepine at that time.  Objective: Current vital signs: BP 129/64   Pulse 72   Temp 97.9 F (36.6 C) (Axillary)   Resp (!) 23   Wt 84.7 kg   SpO2 100%   BMI 26.04 kg/m  Vital signs in last 24 hours: Temp:  [97 F (36.1 C)-100.2 F (37.9 C)] 97.9 F (36.6 C) (03/07 0600) Pulse Rate:  [64-133] 72 (03/07 0600) Resp:  [18-31] 23 (03/07 0600) BP: (101-159)/(56-102) 129/64 (03/07 0600) SpO2:  [97 %-100 %] 100 % (03/07 0600) Weight:  [84.7 kg] 84.7 kg (03/07 0500)  Intake/Output from previous day: 03/06 0701 - 03/07 0700 In: 2296.9 [I.V.:1833.2; IV Piggyback:463.8] Out: [id number] [Urine:1375] Intake/Output this shift: No intake/output data recorded. Nutritional status:  Diet Order             Diet regular Room service appropriate? Yes; Fluid consistency: Thin  Diet effective now                  HEENT: [place]/AT Lungs: Snoring while asleep Ext: No edema  Neurologic Exam: Ment: Somnolent and sedated. Briefly opened eyes slightly with stimulation. Did not awaken sufficiently to converse or follow commands.  CN: Pupils are pinpoint. Eyes are at the midline without nystagmus. Face symmetric.  Motor: Decreased tone x 4 consistent with sleep state Sensory: Moved arms slightly when eyes opened slightly following noxious, otherwise not movement.  Reflexes: Normoactive.  Cerebellar/Gait: Unable to assess  Lab Results: Results for orders placed or performed during the hospital encounter of 11/26/22 (from the past 48 hour(s))  Valproic Acid (depakote) Level (if patient is taking this medication)     Status: None   Collection Time: 11/26/22 11:42 AM   Result Value Ref Range   Valproic Acid Lvl 85 50.0 - 100.0 ug/mL    Comment: Performed at [facility], [address] [place], [place] [address]  CBC - if new onset seizures     Status: Abnormal   Collection Time: 11/26/22 11:42 AM  Result Value Ref Range   WBC 11.6 (H) 4.0 - 10.5 K/uL   RBC 5.54 4.22 - 5.81 MIL/uL   Hemoglobin 15.5 13.0 - 17.0 g/dL   HCT 47.0 39.0 - 52.0 %   MCV 84.8 80.0 - 100.0 fL   MCH 28.0 26.0 - 34.0 pg   MCHC 33.0 30.0 - 36.0 g/dL   RDW 13.2 11.5 - 15.5 %   Platelets 250 150 - 400 K/uL   nRBC 0.0 0.0 - 0.2 %    Comment: Performed at [facility], [place] [address] [place], [place] [address]  Comprehensive metabolic panel     Status: None   Collection Time: 11/26/22 11:48 AM  Result Value Ref Range   Sodium 137 135 - 145 mmol/L   Potassium 5.0 3.5 - 5.1 mmol/L   Chloride 103 98 - 111 mmol/L   CO2 22 22 - 32 mmol/L   Glucose, Bld 83 70 - 99 mg/dL    Comment: Glucose reference range applies only to samples taken after fasting for at least 8 hours.   BUN 6 6 - 20 mg/dL   Creatinine, Ser 1.20 0.61 - 1.24 mg/dL   Calcium 9.8  8.9 - 10.3 mg/dL   Total Protein 7.3 6.5 - 8.1 g/dL   Albumin 4.5 3.5 - 5.0 g/dL   AST 41 15 - 41 U/L   ALT 22 0 - 44 U/L   Alkaline Phosphatase 40 38 - 126 U/L   Total Bilirubin 0.6 0.3 - 1.2 mg/dL   GFR, Estimated >60 >60 mL/min    Comment: (NOTE) Calculated using the CKD-EPI Creatinine Equation (2021)    Anion gap 12 5 - 15    Comment: Performed at [facility] [address], [place], [place] [address]  Magnesium     Status: None   Collection Time: 11/26/22 11:48 AM  Result Value Ref Range   Magnesium 2.2 1.7 - 2.4 mg/dL    Comment: Performed at [facility] [address], [place], [place] [address]  CBG monitoring, ED     Status: None   Collection Time: 11/26/22  1:44 PM  Result Value Ref Range   Glucose-Capillary 88 70 - 99 mg/dL    Comment: Glucose reference range applies only to samples taken  after fasting for at least 8 hours.  HIV Antibody (routine testing w rflx)     Status: None   Collection Time: 11/26/22  9:14 PM  Result Value Ref Range   HIV Screen 4th Generation wRfx Non Reactive Non Reactive    Comment: Performed at [facility], [place] [address] [place], [place] [address]  Ethanol     Status: None   Collection Time: 11/26/22  9:14 PM  Result Value Ref Range   Alcohol, Ethyl (B) <10 <10 mg/dL    Comment: (NOTE) Lowest detectable limit for serum alcohol is 10 mg/dL.  For medical purposes only. Performed at [facility], [place] [address] [place], [place] [address]   Comprehensive metabolic panel     Status: Abnormal   Collection Time: 11/27/22 10:06 AM  Result Value Ref Range   Sodium 139 135 - 145 mmol/L   Potassium 3.4 (L) 3.5 - 5.1 mmol/L   Chloride 98 98 - 111 mmol/L   CO2 17 (L) 22 - 32 mmol/L   Glucose, Bld 98 70 - 99 mg/dL    Comment: Glucose reference range applies only to samples taken after fasting for at least 8 hours.   BUN 9 6 - 20 mg/dL   Creatinine, Ser 1.48 (H) 0.61 - 1.24 mg/dL    Comment: DELTA CHECK NOTED   Calcium 9.4 8.9 - 10.3 mg/dL   Total Protein 7.2 6.5 - 8.1 g/dL   Albumin 4.4 3.5 - 5.0 g/dL   AST 54 (H) 15 - 41 U/L   ALT 22 0 - 44 U/L    Comment: RESULT CONFIRMED BY MANUAL DILUTION   Alkaline Phosphatase 39 38 - 126 U/L   Total Bilirubin 1.2 0.3 - 1.2 mg/dL   GFR, Estimated >60 >60 mL/min    Comment: (NOTE) Calculated using the CKD-EPI Creatinine Equation (2021)    Anion gap 24 (H) 5 - 15    Comment: ELECTROLYTES REPEATED TO VERIFY Performed at [facility], [place] [address], [place] [address]   CBC     Status: None   Collection Time: 11/27/22 10:06 AM  Result Value Ref Range   WBC 9.6 4.0 - 10.5 K/uL   RBC 5.09 4.22 - 5.81 MIL/uL   Hemoglobin 14.2 13.0 - 17.0 g/dL   HCT 43.6 39.0 - 52.0 %   MCV 85.7 80.0 - 100.0  fL   MCH 27.9 26.0 - 34.0 pg   MCHC 32.6 30.0 - 36.0 g/dL   RDW 13.4 11.5 - 15.5 %    Platelets 229 150 - 400 K/uL   nRBC 0.0 0.0 - 0.2 %    Comment: Performed at [facility], [place] [address], [place], [place] [address]  Magnesium     Status: None   Collection Time: 11/27/22 10:06 AM  Result Value Ref Range   Magnesium 1.8 1.7 - 2.4 mg/dL    Comment: Performed at [facility] [address] [place], [place] [address]  Glucose, capillary     Status: None   Collection Time: 11/27/22  4:46 PM  Result Value Ref Range   Glucose-Capillary 76 70 - 99 mg/dL    Comment: Glucose reference range applies only to samples taken after fasting for at least 8 hours.  Glucose, capillary     Status: None   Collection Time: 11/27/22  7:45 PM  Result Value Ref Range   Glucose-Capillary 97 70 - 99 mg/dL    Comment: Glucose reference range applies only to samples taken after fasting for at least 8 hours.  CBC     Status: None   Collection Time: 11/28/22  3:38 AM  Result Value Ref Range   WBC 10.0 4.0 - 10.5 K/uL   RBC 5.00 4.22 - 5.81 MIL/uL   Hemoglobin 14.2 13.0 - 17.0 g/dL   HCT 42.0 39.0 - 52.0 %   MCV 84.0 80.0 - 100.0 fL   MCH 28.4 26.0 - 34.0 pg   MCHC 33.8 30.0 - 36.0 g/dL   RDW 13.4 11.5 - 15.5 %   Platelets 206 150 - 400 K/uL   nRBC 0.0 0.0 - 0.2 %    Comment: Performed at [facility], [place] [address] [place], [place] [id number]  Basic metabolic panel     Status: None   Collection Time: 11/28/22  3:38 AM  Result Value Ref Range   Sodium 141 135 - 145 mmol/L   Potassium 3.6 3.5 - 5.1 mmol/L   Chloride 106 98 - 111 mmol/L   CO2 24 22 - 32 mmol/L   Glucose, Bld 87 70 - 99 mg/dL    Comment: Glucose reference range applies only to samples taken after fasting for at least 8 hours.   BUN 6 6 - 20 mg/dL   Creatinine, Ser 1.08 0.61 - 1.24 mg/dL   Calcium 9.0 8.9 - 10.3 mg/dL   GFR, Estimated >60 >60 mL/min    Comment: (NOTE) Calculated using the CKD-EPI Creatinine Equation (2021)    Anion gap 11 5 - 15    Comment: Performed at [facility] [address], [place], [place] [address]  Magnesium     Status: None   Collection Time: 11/28/22  3:38 AM  Result Value Ref Range   Magnesium 1.9 1.7 - 2.4 mg/dL    Comment: Performed at [facility] [address], [place], [place] [address]  Phosphorus     Status: None   Collection Time: 11/28/22  3:38 AM  Result Value Ref Range   Phosphorus 3.6 2.5 - 4.6 mg/dL    Comment: Performed at [facility] [address], [place],  [address]    No results found for this or any previous visit (from the past 240 hour(s)).  Lipid Panel No results for input(s): "CHOL", "TRIG", "HDL", "CHOLHDL", "VLDL", "LDLCALC" in the last 72  hours.  Studies/Results: No results found.  Medications: Scheduled:  enoxaparin (LOVENOX) injection  40 mg Subcutaneous Daily   Eslicarbazepine Acetate  1,600 mg Oral Daily   haloperidol  10 mg Oral BID   Or   haloperidol lactate  10 mg Intravenous BID   LORazepam  2 mg Oral Q8H   Or   LORazepam  2 mg Intravenous Q8H   thiamine (VITAMIN B1) injection  100 mg Intravenous Daily   Continuous:  dextrose 5% lactated ringers 125 mL/hr at 11/28/22 0600   lactated ringers Stopped (11/27/22 1845)   PHENObarbital 260 mg (11/28/22 0706)   potassium chloride 10 mEq (11/28/22 0708)   valproate sodium Stopped (11/27/22 2343)    Assessment: Patient with longstanding history of seizures on dual AEDs (valproic acid and eslicarbazepine acetate) admitted for witnessed tonic-clinic seizure lasting approximately 1.5 minutes in the setting of recent history of poor sleep, work stressors, and inconsistent medication adherence (valproate level was notably at therapeutic range on admission, despite girlfriend endorsing recent poor compliance).  - Exam this morning reveals a sedated patient who briefly opens eyes to stimulation. No jerking, twitching, posturing, eye deviation or other seizure like activity noted.    - No clinically apparent seizures since he presented  to the ED on Tuesday. However, subclinical frontal lobe seizures are on the DDx and will need to be assessed with EEG.  - Seizures may have been triggered by sleep deprivation reported for 3 days by his girlfriend stemming from anxiety regarding possible sudden death during sleep from a seizure. Also possibly manifesting a manic phase contributing to sleep deprivation. - Patient highly combative yesterday and EEG technician was unable to place leads yesterday.  - His breakthrough seizures prior to admission were most likely secondary to partial medication compliance (suspected noncompliance with eslicarbazepine, as his VPA level on admission was therapeutic. Also suspected is possible substance abuse. His 3 days of sleep deprivation also likely lowered the seizure threshold.  - Sleep deprivation may also have contributed to his acute onset of psychosis/agitation. Characteristics of his behavior in the ED and yesterday would be compatible with an agitated delirium.    Recommendations: - Continue IV valproic acid 1000 mg BID  - Continue Aptiom 1,600 mg daily. This cannot be given IV. Consider crushing and mixing with pudding or applesauce.  - Was moved to the ICU yesterday. Started on scheduled phenobarbital 260 mg IV q4h yesterday evening. Also on  - Other medications for agitation and psychosis: - Scheduled with supplemental PRN doses: - Ativan 2 mg IV/PO q8h supplemented by PRN doses at 5 mg IV q6h - Haldol 10 mg PO BID supplemented by PRN IV dosing - PRN only: - Midazolam PRN dosing 2-4 mg IV/IM q1h - Olanzapine PRN doses at 5 mg IM or IV q8h - Geodon PRN dosing at 10 mg IM q6h - CCM has elected not to use Precedex due to concern for bradycardia - From a neurological standpoint, there does not appear to be any indication for keeping the patient NPO - Psychiatry is consulting. Appreciate their recommendations towards titrating the patient towards a stable, non-agitated and preferably non-sedated  state. Will not be able to place EEG until agitation resolves.    - EEG to rule out subclinical seizure activity has been ordered, as frontal lobe seizures can potentially present with behavioral abnormalities.  - Now calm enough to have EEG leads placed. Order changed to LTM.  - Inpatient seizure precautions  Addendum: - Eslicarbazepine not on formulary  and patient's home medication eslicarbazepine pill bottle cannot be located.  - Switching to eslicarbazepine to oxcarbazepine equivalent dose.   Addendum: - LTM EEG reviewed: No evidence for status epilepticus based on my review of the EEG. EEG does show less muscle artifact and beta waves consistent with benzo administration after RN gave it at about 10:40 AM. Less muscle artifact is due to reduced thrashing/agitation described by RN as sedating effect manifested after benzo administration.  - As of 6:30 PM when I reevaluated him at the bedside, the patient is still agitated but with less of an anger/aggression component, which RN feels is due to the Ketamine injection he received 1x at about 3:30 PM today.    LOS: 1 day   '@Electronically'$  signed: Dr. Kerney Elbe 11/28/2022  7:10 AM

## 2022-11-28 NOTE — Progress Notes (Signed)
Reassessed at 4:15. S/o at bedside with 1:1 sitter and RN. He just had another severe agitation episode requiring PRN benadryl then shortly after PRN versed + ketamine. He is back asleep, but still fidgeting frequently in bed. Still requires hard restraints. Order renewed.   Julian Hy, DO 11/28/22 4:30 PM [facility]

## 2022-11-28 NOTE — Progress Notes (Signed)
NAME:  Dylan Moon, MRN:  [id number], DOB:  [birth year], LOS: 1 ADMISSION DATE:  11/26/2022, CONSULTATION DATE:  11/27/2022 REFERRING MD:  Dr. Gwendlyn Deutscher , CHIEF COMPLAINT: Severe agitation   History of Present Illness:  Dylan Moon is a 27 year old male with past medical history significant for seizures, ADHD, bipolar, schizoaffective disease, scoliosis, and medical noncompliance who presented to the emergency department 3/5 for seizures, felt secondary to medication noncompliance.  Patient was admitted per family medicine teaching service with neurology consultation.  Mid morning of 3/6 patient progressively became more violent and agitated towards staff resulting in security being called, application of violent restraints, and eventually addition of chemical restraints as well. Given need for close monitoring of mentation and airway after chemical restraints PCCM was consulted    Pertinent  Medical History  Seizures, ADHD, bipolar, schizoaffective disease, scoliosis, and medical noncompliance  Significant Hospital Events: Including procedures, antibiotic start and stop dates in addition to other pertinent events   3/5 presented to ED after 1.5-minute tonic-clonic seizure per family 3/6 severely agitated and combative towards staff resulting in both violent and chemical restraints   Interim History / Subjective:  Overnight was more controlled with scheduled AP and phenobarb, still needed PRNs though. He asked for his foley to be removed yesterday. When he woke up overnight he was still banging his head on the bed rails.   Objective   Blood pressure 123/64, pulse 74, temperature 97.9 F (36.6 C), temperature source Axillary, resp. rate (!) 21, weight 84.7 kg, SpO2 100 %.        Intake/Output Summary (Last 24 hours) at 11/28/2022 0751 Last data filed at 11/28/2022 0700 Gross per 24 hour  Intake 2553.19 ml  Output 1375 ml  Net 1178.19 ml    Filed Weights   11/28/22 0500  Weight: 84.7  kg    Examination: General:  young man lying in bed in NAD HEENT: [place]/AT, eyes anicteric Neuro: sleeping, arouses during exam, moving all extremities and repositioning himself in bed CV: S1S2, RRR  PULM:  breathing comfortably with ate in low 20s, CTAB GI: soft, NT Extremities: no c/c/e. No injuries from 4-point restraints.   Skin: warm, dry, no rashes  Labs reviewed- all normal. No cultures pending.  Resolved Hospital Problem list   AKI  Assessment & Plan:  Epilepsy with longstanding seizure history, vagal nerve stimulator. -con't PTA eslicarbazepine, which he has been refusing -using phenobarbital and benzos for behavior control until he will comply with taking seizure medications -seizure precautions -ativan PRN for seizures  Acute psychosis with aggressive behavior -- his previous outpatient diagnoses of ADHD and anxiety do not fully explain his presentation, which appears to be schizoaffective disorder, bipolar disorder, or post-ictal psychosis History of medication noncompliance -Appreciate Psychiatry's management -due to very aggressive and self-harming behaviors, he still requires restraints unfortunately.  -con't schedled antipsychotics + P{RN -con't phenobarbital scheduled to limit aggressive behavior -con't versed PRN for breakthrough agitation -Qtc monitoring -ETCO2 with use of respiratory suppressing meds until a better regimen is found or he demonstrates tolerance  -safety sitter  1:1  AKI, resolved with hydration -con't low-dose LR until PO intake improves  Best Practice (right click and "Reselect all SmartList Selections" daily)   Diet/type: Regular consistency (see orders) DVT prophylaxis: LMWH GI prophylaxis: N/A Lines: N/A Foley:  N/A Code Status:  full code Last date of multidisciplinary goals of care discussion: Family updated at bedside 3/6  Labs   CBC: Recent Labs  Lab 11/26/22 1142  11/27/22 1006 11/28/22 0338  WBC 11.6* 9.6 10.0  HGB  15.5 14.2 14.2  HCT 47.0 43.6 42.0  MCV 84.8 85.7 84.0  PLT 250 229 206     Basic Metabolic Panel: Recent Labs  Lab 11/26/22 1148 11/27/22 1006 11/28/22 0338  NA 137 139 141  K 5.0 3.4* 3.6  CL 103 98 106  CO2 22 17* 24  GLUCOSE 83 98 87  BUN '6 9 6  '$ CREATININE 1.20 1.48* 1.08  CALCIUM 9.8 9.4 9.0  MG 2.2 1.8 1.9  PHOS  --   --  3.6    GFR: Estimated Creatinine Clearance: 109.4 mL/min (by C-G formula based on SCr of 1.08 mg/dL). Recent Labs  Lab 11/26/22 1142 11/27/22 1006 11/28/22 0338  WBC 11.6* 9.6 10.0     Liver Function Tests: Recent Labs  Lab 11/26/22 1148 11/27/22 1006  AST 41 54*  ALT 22 22  ALKPHOS 40 39  BILITOT 0.6 1.2  PROT 7.3 7.2  ALBUMIN 4.5 4.4      Critical care time:      This patient is critically ill with multiple organ system failure which requires frequent high complexity decision making, assessment, support, evaluation, and titration of therapies. This was completed through the application of advanced monitoring technologies and extensive interpretation of multiple databases. During this encounter critical care time was devoted to patient care services described in this note for 44 minutes.  Julian Hy, DO 11/28/22 8:04 AM [facility]  For contact information, see Amion. If no response to pager, please call PCCM consult pager. After hours, 7PM- 7AM, please call Elink.

## 2022-11-28 NOTE — Progress Notes (Signed)
OT Cancellation Note  Patient Details Name: GIANPAOLO MCMULLEN MRN: [id number] DOB: 1996/08/08   Cancelled Treatment:    Reason Eval/Treat Not Completed: Patient not medically ready;Other (comment) (pt with agitation and restrained)  Malka So 11/28/2022, 7:55 AM Cleta Alberts, OTR/L Acute Rehabilitation Services Office: [phone]

## 2022-11-28 NOTE — Progress Notes (Addendum)
eLink Physician-Brief Progress Note Patient Name: LINLEY MOXLEY DOB: 12/20/1995 MRN: [id number]   Date of Service  11/28/2022  HPI/Events of Note  27 year old male with a history of PTSD, ADHD and likely schizoaffective disorder who is admitted for seizures.  He has had ongoing periods of severe aggression. Currently calm with 4 points in place   eICU Interventions  Renew violent restraints  Sitter at bedside  If refractory to current medications, may utilize ketamine again throughout the night.  Hemodynamically stable for now and no clear contraindications.     2305 - Ongoing agitation, qtc 437 -> increase Geodon to q4H PRN.  Renew Restraints  [id number] - behavioral restraints renewed, ongoing agitation, mild st changes on monitor, sinus arrhythmia on ECG.  QTc 436.  [id number] - restraints renewed  Intervention Category Minor Interventions: Agitation / anxiety - evaluation and management  Bentlee Drier 11/28/2022, 7:56 PM

## 2022-11-28 NOTE — Consult Note (Signed)
[facility] Face-to-Face Psychiatric Evaluation   Service Date: November 28, 2022 LOS:  LOS: 1 day  Reason for Consult: "suicidal attempt" Consult by: Donney Dice  Assessment  Dylan Moon is a 27 y.o. male with hx of PTSD, ADHD, ?schizoaffective disorder, depression, anxiety, and seizures, admitted medically for 11/26/2022 11:30 AM for seizures.   Patient continues to be agitated despite multiple doses of phenobarbital, PRN versed, PRN geodon, scheduled haldol, depacon, and scheduled ativan. IV Benadryl and clonidine patch were ordered and increased haldol to 10 mg tid.  Considering IM/PO zyprexa tomorrow on top of haldol. Discussion ongoing regarding ketamine to aid with agitation.   He is currently under IVC and would recommend continuing it along with 1:1 sitter. At this point given confusion and agitation at least partially due to postictal state, cannot determine if inpt psych will benefit pt. Will reassess this daily. Differential remains the same as yesterday (schizoaffective disorder vs bipolar disorder vs postictal psychosis vs substance induced psychosis).   Plan  ## Safety and Observation Level:  - Based on my clinical evaluation, I estimate the patient to be at high risk of self harm in the current setting. Recommend 1:1 sitter and restraints as needed.  #Severe Agitation #Seizures #hx of PTSD #hx of ADHD Bipolar Disorder vs Postictal psychosis vs schizoaffective disorder vs substance induced psychosis -Geodon 10 mg IV q6h prn -Ativan 2 mg IV or PO q8h scheduled -INCREASE Haldol to 10 mg PO or IV q8h scheduled -START Clonidine patch 0.1 mg weekly -Defer depcaon and phenobarbital dosing to primary and neurology -If continues to be agitated, may benefit from ketamine to aid with agitation given refractory agitation -Also considering adding zyprexa IM/PO tomorrow as second scheduled antipsychotic agent -1:1 sitter Pertinent labs: ECG 11/27/22 NSR Qtc 437, mild  increase in Cr 1.2>1.48, CBC wnl  ## Disposition:  -- pending  Thank you for this consult request. Recommendations have been communicated to the primary team.  We will continue to follow at this time.   France Ravens, MD   Relevant History  Relevant Aspects of Hospital Course:  Admitted on 11/26/2022 for seizures 3/6 rapid called due to severe agitation. Admitted to ICU and started on phenobarbital.  Patient Report:  Limited history obtained due to agitation. He continues to shout expletives but at least will say a few words in between. He perseverates on getting out of restraints asking if I would be able to get scissors to cut off restraints. I attempted to verbally redirect but failed multiple times. He at one point appeared to hallucinate stating "get that child out of there" while looking outside of room but there was no child there.  Collateral information:  Ms Pamala Duffel in waiting room. Spoke with mother over Ms Armandina Gemma over phone as well. Discussed extensively treatment options. They are willing to try to be around patient to aid with reorientation in order to minimize agitation. They feel his behavior is likely secondary to "hospital anxiety" as he behaved similarly last hospitalization but also feel that this current behavior is more agitated than last time. Discussed both pharmacologic and non-pharmacologic ways to aid with patient's agitation. They verbalized understanding.  Psychiatric History:  Never been psychiatrically hospitalized. He did establish care with Dr. Hassell Done at [facility] who diagnosed patient with Cannabis use disorder, GAD w/ panic attacks, and PTSD and he as started on mirtazapine.   Social History:  Tobacco use: 3 black and mild per day Alcohol use: denies Drug  use: Vape THC and smokes 3-4 joints of marijuana per day  Family History:  The patient's family history includes Cancer in an other family member; Diabetes in his father and mother;  Hypertension in his mother; Seizures in his maternal grandfather.  Medical History: Past Medical History:  Diagnosis Date   ADHD    Bipolar 1 disorder ([place])    Schizoaffective disorder ([place])    Scoliosis    Seizures ([place])    most recent 12/02/17    Surgical History: Past Surgical History:  Procedure Laterality Date   NO PAST SURGERIES      Medications:   Current Facility-Administered Medications:    acetaminophen (TYLENOL) tablet 650 mg, 650 mg, Oral, Q4H PRN, Noemi Chapel P, DO   cloNIDine (CATAPRES - Dosed in mg/24 hr) patch 0.1 mg, 0.1 mg, Transdermal, Weekly, France Ravens, MD   dextrose 5 % in lactated ringers infusion, , Intravenous, Continuous, Julian Hy, DO, Last Rate: 125 mL/hr at 11/28/22 1000, Infusion Verify at 11/28/22 1000   diphenhydrAMINE (BENADRYL) injection 50 mg, 50 mg, Intravenous, Q6H PRN, Bowser, Laurel Dimmer, NP   docusate sodium (COLACE) capsule 100 mg, 100 mg, Oral, BID PRN, Noemi Chapel P, DO   enoxaparin (LOVENOX) injection 40 mg, 40 mg, Subcutaneous, Daily, Noemi Chapel P, DO, 40 mg at [id number]   Eslicarbazepine Acetate TABS 1,600 mg, 1,600 mg, Oral, Daily, Kerney Elbe, MD   haloperidol (HALDOL) tablet 10 mg, 10 mg, Oral, BID **OR** haloperidol lactate (HALDOL) injection 10 mg, 10 mg, Intravenous, BID, France Ravens, MD, 10 mg at 11/28/22 0900   LORazepam (ATIVAN) tablet 2 mg, 2 mg, Oral, Q8H **OR** LORazepam (ATIVAN) injection 2 mg, 2 mg, Intravenous, Q8H, France Ravens, MD, 2 mg at 11/28/22 0600   LORazepam (ATIVAN) injection 4 mg, 4 mg, Intravenous, PRN, Eppie Gibson, MD   midazolam (VERSED) injection 2-4 mg, 2-4 mg, Intravenous, Q1H PRN, 4 mg at 11/28/22 0948 **OR** midazolam (VERSED) injection 2-4 mg, 2-4 mg, Intramuscular, Q1H PRN, Noemi Chapel P, DO   PHENObarbital (LUMINAL) 260 mg in sodium chloride 0.9 % 100 mL IVPB, 260 mg, Intravenous, Q4H, Julian Hy, DO, Stopped at 11/28/22 [id number]   polyethylene glycol (MIRALAX / GLYCOLAX) packet 17 g, 17  g, Oral, Daily PRN, Noemi Chapel P, DO   thiamine (VITAMIN B1) injection 100 mg, 100 mg, Intravenous, Daily, Noemi Chapel P, DO, 100 mg at 11/28/22 0900   valproate (DEPACON) 1,000 mg in dextrose 5 % 50 mL IVPB, 1,000 mg, Intravenous, Q12H, Kerney Elbe, MD, Stopped at 11/28/22 0957   white petrolatum (VASELINE) gel, , Topical, PRN, Noemi Chapel P, DO   ziprasidone (GEODON) injection 10 mg, 10 mg, Intramuscular, Q6H PRN, France Ravens, MD, 10 mg at 11/28/22 1002  Allergies: Allergies  Allergen Reactions   Keppra [Levetiracetam] Other (See Comments)    Irritability    Tramadol Other (See Comments)    Contraindicated with current medications (??)   Vimpat [Lacosamide] Other (See Comments)    Causes anger       Objective  Vital signs:  Temp:  [97 F (36.1 C)-100.2 F (37.9 C)] 99.7 F (37.6 C) (03/07 1006) Pulse Rate:  [64-138] 100 (03/07 1030) Resp:  [18-39] 30 (03/07 1030) BP: (101-159)/(53-102) 137/61 (03/07 1030) SpO2:  [96 %-100 %] 100 % (03/07 1030) Weight:  [84.7 kg] 84.7 kg (03/07 0500)  Psychiatric Specialty Exam:  Presentation  General Appearance: Disheveled Eye Contact:poor Speech: coherent and expletive Speech Volume:loud  Mood and Affect  Mood:  did not inquire Affect: irritable, agitated, anxious, labile  Thought Process  Thought Processes: disorganzied Descriptions of Associations: tangential Orientation: not assessed Thought Content: perseverative on discharging Hallucinations: briefly reported to have seen a child that was not there Ideas of Reference: persecutory, paranoid Suicidal Thoughts: not assessed Homicidal Thoughts: threatened multiple staff members  Sensorium  Memory:not formally assessed Judgment: impaired Insight: poor  Executive Functions  Concentration: poor Attention Span:poor Recall:poor Fund of Knowledge: fair Language: fair  Psychomotor Activity  Psychomotor Activity:increased   Sleep  Sleep:poor

## 2022-11-29 ENCOUNTER — Inpatient Hospital Stay (HOSPITAL_COMMUNITY): Payer: Medicaid Other

## 2022-11-29 DIAGNOSIS — G40909 Epilepsy, unspecified, not intractable, without status epilepticus: Secondary | ICD-10-CM

## 2022-11-29 DIAGNOSIS — R569 Unspecified convulsions: Secondary | ICD-10-CM | POA: Diagnosis not present

## 2022-11-29 LAB — BASIC METABOLIC PANEL
Anion gap: 13 (ref 5–15)
BUN: <5 mg/dL — ABNORMAL LOW (ref 6–20)
CO2: 24 mmol/L (ref 22–32)
Calcium: 9.2 mg/dL (ref 8.9–10.3)
Chloride: 104 mmol/L (ref 98–111)
Creatinine, Ser: 1.14 mg/dL (ref 0.61–1.24)
GFR, Estimated: >60 mL/min (ref >60)
Glucose, Bld: 79 mg/dL (ref 70–99)
Potassium: 4.2 mmol/L (ref 3.5–5.1)
Sodium: 141 mmol/L (ref 135–145)

## 2022-11-29 LAB — GLUCOSE, CAPILLARY
Glucose-Capillary: 108 mg/dL — ABNORMAL HIGH (ref 70–99)
Glucose-Capillary: 125 mg/dL — ABNORMAL HIGH (ref 70–99)
Glucose-Capillary: 127 mg/dL — ABNORMAL HIGH (ref 70–99)

## 2022-11-29 LAB — MRSA NEXT GEN BY PCR, NASAL: MRSA by PCR Next Gen: NOT DETECTED

## 2022-11-29 LAB — PHOSPHORUS
Phosphorus: 5.1 mg/dL — ABNORMAL HIGH (ref 2.5–4.6)
Phosphorus: 3.4 mg/dL (ref 2.5–4.6)

## 2022-11-29 LAB — MAGNESIUM: Magnesium: 2 mg/dL (ref 1.7–2.4)

## 2022-11-29 MED ORDER — PHENOBARBITAL SODIUM 130 MG/ML IJ SOLN
130.0000 mg | INTRAMUSCULAR | Status: DC
  Administered 2022-11-29 – 2022-11-30 (×5): 130 mg via INTRAVENOUS
  Filled 2022-11-29 (×5): qty 1

## 2022-11-29 MED ORDER — HALOPERIDOL LACTATE 5 MG/ML IJ SOLN: 10.0000 mg | Freq: Three times a day (TID) | INTRAMUSCULAR | Status: DC

## 2022-11-29 MED ORDER — ESLICARBAZEPINE ACETATE 800 MG PO TABS: 1600.0000 mg | ORAL_TABLET | Freq: Every day | ORAL | Status: DC

## 2022-11-29 MED ORDER — LACTATED RINGERS IV SOLN: INTRAVENOUS | Status: DC

## 2022-11-29 MED ORDER — PROSOURCE TF20 ENFIT COMPATIBL EN LIQD
60.0000 mL | Freq: Every day | ENTERAL | Status: DC
  Administered 2022-11-29 – 2022-12-07 (×9): 60 mL
  Filled 2022-11-29 (×10): qty 60

## 2022-11-29 MED ORDER — ESLICARBAZEPINE ACETATE 800 MG PO TABS
1600.0000 mg | ORAL_TABLET | Freq: Every day | ORAL | Status: DC
  Filled 2022-11-29: qty 2

## 2022-11-29 MED ORDER — ESLICARBAZEPINE ACETATE 800 MG PO TABS: 2.0000 | ORAL_TABLET | Freq: Every day | ORAL | Status: DC

## 2022-11-29 MED ORDER — ESLICARBAZEPINE ACETATE 800 MG PO TABS
1600.0000 mg | ORAL_TABLET | Freq: Every day | ORAL | Status: DC
  Administered 2022-11-29 – 2022-12-08 (×10): 1600 mg
  Filled 2022-11-29 (×10): qty 2

## 2022-11-29 MED ORDER — OSMOLITE 1.5 CAL PO LIQD
1000.0000 mL | ORAL | Status: DC
  Administered 2022-11-29 – 2022-12-06 (×9): 1000 mL
  Filled 2022-11-29 (×2): qty 1000

## 2022-11-29 MED ORDER — HALOPERIDOL 5 MG PO TABS
5.0000 mg | ORAL_TABLET | Freq: Three times a day (TID) | ORAL | Status: DC
  Administered 2022-11-29: 5 mg via ORAL
  Filled 2022-11-29 (×3): qty 1

## 2022-11-29 MED ORDER — STERILE WATER FOR INJECTION IJ SOLN
INTRAMUSCULAR | Status: AC
  Filled 2022-11-29: qty 20

## 2022-11-29 NOTE — Consult Note (Signed)
[facility] Face-to-Face Psychiatric Evaluation   Service Date: November 29, 2022 LOS:  LOS: 2 days  Reason for Consult: "suicidal attempt" Consult by: Donney Dice  Assessment  Dylan Moon is a 27 y.o. male with hx of PTSD, ADHD, ?schizoaffective disorder, depression, anxiety, and seizures, admitted medically for 11/26/2022 11:30 AM for seizures.   Patient more calm today. Haldol was decreased to 5 mg tid and phenobarbital decreased to 130 mg q4h. Cortrak placed. No seizures noted on EEG but "independent epileptogenicity arising from left> right frontotemporal region" was noted. Initially was planning to add risperidone or zyprexa to stabilize patient further in lieu of other more sedating medications but given patient's sedation, reasonable to see if patient less agitated at this time and could come off restraints.   He is currently under IVC and would recommend continuing it along with 1:1 sitter. At this point given confusion and agitation at least partially due to postictal state, cannot determine if inpt psych will benefit pt. Will reassess this daily. Differential remains the same as yesterday (schizoaffective disorder vs bipolar disorder vs postictal psychosis vs substance induced psychosis).   Plan  ## Safety and Observation Level:  - Based on my clinical evaluation, I estimate the patient to be at high risk of self harm in the current setting. Recommend 1:1 sitter and restraints as needed.  #Severe Agitation #Seizures #Bipolar disorder #hx of PTSD #hx of ADHD Bipolar Disorder vs Postictal psychosis vs schizoaffective disorder vs substance induced psychosis -Geodon 10 mg IV q6h prn -Ativan 2 mg IV or PO q8h scheduled (can likely decrease tomorrow) -Haldol decreased to 5 mg PO or IV q8h scheduled -Continue Clonidine patch 0.1 mg weekly -Defer depcaon and phenobarbital dosing to primary and neurology -If continues to be agitated, may benefit from ketamine to aid  with agitation given refractory agitation -Also considering adding zyprexa IM/PO tomorrow as second scheduled antipsychotic agent -1:1 sitter Pertinent labs: ECG 11/27/22 NSR Qtc 437, mild increase in Cr 1.2>1.48, CBC wnl  ## Disposition:  -- pending  Thank you for this consult request. Recommendations have been communicated to the primary team.  We will continue to follow at this time.   France Ravens, MD   Relevant History  Relevant Aspects of Hospital Course:  Admitted on 11/26/2022 for seizures 3/6 rapid called due to severe agitation. Admitted to ICU and started on phenobarbital.  Patient Report:  Limited history obtained due to agitation. He mumbled some incomprehensible phrases but appeared less agitated today.   Collateral information:  Spoke with mom and girlfriend in waiting room. They appear open to idea patient's agitation may also be secondary to bipolar disorder. Discussed both pharmacologic and non-pharmacologic ways to aid with patient's agitation. They verbalized understanding.  Psychiatric History:  Never been psychiatrically hospitalized. He did establish care with Dr. Hassell Done at [facility] who diagnosed patient with Cannabis use disorder, GAD w/ panic attacks, and PTSD and he was started on mirtazapine.   Social History:  Tobacco use: 3 black and mild per day Alcohol use: denies Drug use: Vape THC and smokes 3-4 joints of marijuana per day  Family History:  The patient's family history includes Cancer in an other family member; Diabetes in his father and mother; Hypertension in his mother; Seizures in his maternal grandfather.  Medical History: Past Medical History:  Diagnosis Date   ADHD    Bipolar 1 disorder ([place])    Schizoaffective disorder ([place])    Scoliosis  Seizures ([place])    most recent 12/02/17    Surgical History: Past Surgical History:  Procedure Laterality Date   NO PAST SURGERIES      Medications:   Current  Facility-Administered Medications:    acetaminophen (TYLENOL) tablet 650 mg, 650 mg, Oral, Q4H PRN, Noemi Chapel P, DO   Chlorhexidine Gluconate Cloth 2 % PADS 6 each, 6 each, Topical, Q2000, Julian Hy, DO, 6 each at 11/28/22 2200   cloNIDine (CATAPRES - Dosed in mg/24 hr) patch 0.1 mg, 0.1 mg, Transdermal, Weekly, France Ravens, MD, 0.1 mg at 11/28/22 1117   dextrose 5 % in lactated ringers infusion, , Intravenous, Continuous, Julian Hy, DO, Last Rate: 125 mL/hr at 11/29/22 0800, Infusion Verify at 11/29/22 0800   diphenhydrAMINE (BENADRYL) injection 50 mg, 50 mg, Intravenous, Q6H PRN, Cristal Generous, NP, 50 mg at 11/29/22 0511   docusate sodium (COLACE) capsule 100 mg, 100 mg, Oral, BID PRN, Noemi Chapel P, DO   enoxaparin (LOVENOX) injection 40 mg, 40 mg, Subcutaneous, Daily, Noemi Chapel P, DO, 40 mg at 11/28/22 1023   haloperidol (HALDOL) tablet 10 mg, 10 mg, Oral, Q8H **OR** haloperidol lactate (HALDOL) injection 10 mg, 10 mg, Intravenous, Q8H, France Ravens, MD, 10 mg at 11/29/22 [id number]   ketamine 50 mg in normal saline 5 mL (10 mg/mL) syringe, 1 mg/kg, Intravenous, Once PRN, Julian Hy, DO, 85 mg at 11/28/22 1525   LORazepam (ATIVAN) tablet 2 mg, 2 mg, Oral, Q8H **OR** LORazepam (ATIVAN) injection 2 mg, 2 mg, Intravenous, Q8H, France Ravens, MD, 2 mg at 11/29/22 0617   LORazepam (ATIVAN) injection 4 mg, 4 mg, Intravenous, PRN, Eppie Gibson, MD   midazolam (VERSED) injection 2-4 mg, 2-4 mg, Intravenous, Q1H PRN, 4 mg at 11/29/22 0511 **OR** midazolam (VERSED) injection 2-4 mg, 2-4 mg, Intramuscular, Q1H PRN, Noemi Chapel P, DO   OXcarbazepine (TRILEPTAL) tablet 750 mg, 750 mg, Oral, BID, Kerney Elbe, MD   PHENObarbital (LUMINAL) 260 mg in sodium chloride 0.9 % 100 mL IVPB, 260 mg, Intravenous, Q4H, Julian Hy, DO, Last Rate: 400 mL/hr at 11/29/22 0841, 260 mg at 11/29/22 0841   polyethylene glycol (MIRALAX / GLYCOLAX) packet 17 g, 17 g, Oral, Daily PRN, Julian Hy, DO    sterile water (preservative free) injection, , , ,    thiamine (VITAMIN B1) injection 100 mg, 100 mg, Intravenous, Daily, Noemi Chapel P, DO, 100 mg at 11/28/22 0900   valproate (DEPACON) 1,000 mg in dextrose 5 % 50 mL IVPB, 1,000 mg, Intravenous, Q12H, Kerney Elbe, MD, Stopped at 11/28/22 2249   white petrolatum (VASELINE) gel, , Topical, PRN, Noemi Chapel P, DO   ziprasidone (GEODON) injection 10 mg, 10 mg, Intramuscular, Q4H PRN, Paliwal, Aditya, MD, 10 mg at 11/29/22 0157  Allergies: Allergies  Allergen Reactions   Keppra [Levetiracetam] Other (See Comments)    Irritability    Tramadol Other (See Comments)    Contraindicated with current medications (??)   Vimpat [Lacosamide] Other (See Comments)    Causes anger       Objective  Vital signs:  Temp:  [98 F (36.7 C)-100.6 F (38.1 C)] 98.3 F (36.8 C) (03/08 0800) Pulse Rate:  [64-201] 72 (03/08 0700) Resp:  [17-39] 23 (03/08 0700) BP: (110-153)/(49-109) 121/74 (03/08 0700) SpO2:  [80 %-100 %] 100 % (03/08 0700) Weight:  [85.2 kg] 85.2 kg (03/08 0500)  Psychiatric Specialty Exam:  Presentation  General Appearance: Disheveled Eye Contact: none Speech: garbled  Speech Volume:  mumbling  Mood and Affect  Mood: did not assess Affect: somnolent  Thought Process  Thought Processes: unable to assess Descriptions of Associations: unable to assess Orientation: not assessed Thought Content:unable to assess Hallucinations: unable to assess Ideas of Reference: unable to assess Suicidal Thoughts: unable to assess Homicidal Thoughts: unable to assess  Sensorium  Memory:unable to assess Judgment: unable to assess Insight: unable to assess  Executive Functions  Concentration: unable to assess Attention Span:unable to assess Recall:unable to assess Fund of Knowledge: unable to assess Language: unable to assess  Psychomotor Activity  Psychomotor Activity: decreased   Sleep  Sleep:poor

## 2022-11-29 NOTE — Progress Notes (Signed)
LTM maint complete - no skin breakdown under: CZ,PZ,P3.

## 2022-11-29 NOTE — Progress Notes (Signed)
PT Cancellation Note  Patient Details Name: MANJOT BROWNRIDGE MRN: [id number] DOB: 1996-07-21   Cancelled Treatment:    Reason Eval/Treat Not Completed: Patient not medically ready - pt lethargic, per chart review was agitated overnight requiring medication to address. PT to sign off per rehab protocol of x3 cancels in a row, please reconsult when appropriate.  Stacie Glaze, PT DPT Acute Rehabilitation Services Pager [phone]  Office [phone]    Roxine Caddy E Ruffin Pyo 11/29/2022, 10:01 AM

## 2022-11-29 NOTE — Procedures (Addendum)
Patient Name: TAIDYN BOESCH  MRN: [id number]  Epilepsy Attending: Charlsie Quest  Referring Physician/Provider: Steffanie Dunn, DO  Duration: 11/28/2022 4098 to 11/29/2022 1191  Patient history: 26yo M with ams. EEG to evaluate for seizure  Level of alertness: Awake, asleep  AEDs during EEG study: OXC, Phenobarb, VPA  Technical aspects: This EEG study was done with scalp electrodes positioned according to the 10-20 International system of electrode placement. Electrical activity was reviewed with band pass filter of 1-70Hz , sensitivity of 7 uV/mm, display speed of 40mm/sec with a 60Hz  notched filter applied as appropriate. EEG data were recorded continuously and digitally stored.  Video monitoring was available and reviewed as appropriate.  Description: No clear posterior dominant rhythm was seen. Sleep was characterized by vertex waves, sleep spindles (12 to 14 Hz), maximal frontocentral region.  EEG showed continuous generalized 3 to 6 Hz theta-delta slowing admixed with an excessive amount of 13-15 Hz beta activity distributed symmetrically and diffusely. Sharp waves were noted in left> right frontotemporal region. Hyperventilation and photic stimulation were not performed.     ABNORMALITY - Sharp waves, left> right frontotemporal region - Continuous slow, generalized - Excessive beta, generalized  IMPRESSION: This study showed evidence of independent epileptogenicity arising from left> right frontotemporal region. Additionally there is moderate diffuse encephalopathy, nonspecific etiology. The excessive beta activity seen in the background is most likely due to the effect of benzodiazepine and is a benign EEG pattern. No seizures were seen throughout the recording.  Jensen Kilburg Annabelle Harman

## 2022-11-29 NOTE — Progress Notes (Signed)
PCCM interval progress:  Pt somnolent, but intermittently trying to sit up with stimulation.  Cortrak placed and po medications resumed, decrease phenobarbital from scheduled '260mg'$  to '130mg'$  and scheduled Haldol to '5mg'$ .   Family updated at the bedside.  Dylan Carpen Tiyah Zelenak, PA-C

## 2022-11-29 NOTE — Progress Notes (Signed)
LTM maint complete - no skin breakdown under:  Frontal leads [facility] monitored, Event button test confirmed by [facility].

## 2022-11-29 NOTE — Progress Notes (Signed)
Subjective: Somnolent/sedated and in 4 point restraints with safety sitter at the bedside. No family present  He has continued to be very agitated overnight. Moves all 4 extremities   Objective: Current vital signs: BP 121/74 (BP Location: Right Arm)   Pulse 72   Temp 98.3 F (36.8 C) (Axillary)   Resp (!) 23   Wt 85.2 kg   SpO2 100%   BMI 26.20 kg/m  Vital signs in last 24 hours: Temp:  [98 F (36.7 C)-100.6 F (38.1 C)] 98.3 F (36.8 C) (03/08 0800) Pulse Rate:  [64-201] 72 (03/08 0700) Resp:  [17-39] 23 (03/08 0700) BP: (110-153)/(49-109) 121/74 (03/08 0700) SpO2:  [80 %-100 %] 100 % (03/08 0700) Weight:  [85.2 kg] 85.2 kg (03/08 0500)  Intake/Output from previous day: 03/07 0701 - 03/08 0700 In: 3954 [I.V.:2926.8; IV Piggyback:1027.2] Out: 1100 [Urine:1100] Intake/Output this shift: Total I/O In: 125 [I.V.:125] Out: -  Nutritional status:  Diet Order             Diet regular Room service appropriate? Yes; Fluid consistency: Thin  Diet effective now                  HEENT: [place]/AT Lungs: Snoring while asleep Ext: No edema  Neurologic Exam: Ment: Somnolent and sedated. Briefly opened eyes slightly with stimulation. Did not awaken sufficiently to converse or follow commands.  CN: Pupils are pinpoint. Eyes are at the midline without nystagmus. Face symmetric.  Motor: Decreased tone x 4 consistent with sleep state Sensory: responds to noxious stimuli  Reflexes: Normoactive.  Cerebellar/Gait: Unable to assess  Lab Results: Results for orders placed or performed during the hospital encounter of 11/26/22 (from the past 48 hour(s))  Comprehensive metabolic panel     Status: Abnormal   Collection Time: 11/27/22 10:06 AM  Result Value Ref Range   Sodium 139 135 - 145 mmol/L   Potassium 3.4 (L) 3.5 - 5.1 mmol/L   Chloride 98 98 - 111 mmol/L   CO2 17 (L) 22 - 32 mmol/L   Glucose, Bld 98 70 - 99 mg/dL    Comment: Glucose reference range applies only to samples  taken after fasting for at least 8 hours.   BUN 9 6 - 20 mg/dL   Creatinine, Ser 1.48 (H) 0.61 - 1.24 mg/dL    Comment: DELTA CHECK NOTED   Calcium 9.4 8.9 - 10.3 mg/dL   Total Protein 7.2 6.5 - 8.1 g/dL   Albumin 4.4 3.5 - 5.0 g/dL   AST 54 (H) 15 - 41 U/L   ALT 22 0 - 44 U/L    Comment: RESULT CONFIRMED BY MANUAL DILUTION   Alkaline Phosphatase 39 38 - 126 U/L   Total Bilirubin 1.2 0.3 - 1.2 mg/dL   GFR, Estimated >60 >60 mL/min    Comment: (NOTE) Calculated using the CKD-EPI Creatinine Equation (2021)    Anion gap 24 (H) 5 - 15    Comment: ELECTROLYTES REPEATED TO VERIFY Performed at [facility], [place] [address], [place] [address]   CBC     Status: None   Collection Time: 11/27/22 10:06 AM  Result Value Ref Range   WBC 9.6 4.0 - 10.5 K/uL   RBC 5.09 4.22 - 5.81 MIL/uL   Hemoglobin 14.2 13.0 - 17.0 g/dL   HCT 43.6 39.0 - 52.0 %   MCV 85.7 80.0 - 100.0 fL   MCH 27.9 26.0 - 34.0 pg   MCHC 32.6 30.0 - 36.0 g/dL  RDW 13.4 11.5 - 15.5 %   Platelets 229 150 - 400 K/uL   nRBC 0.0 0.0 - 0.2 %    Comment: Performed at [facility], [place] [address] [place], [place] [address]  Magnesium     Status: None   Collection Time: 11/27/22 10:06 AM  Result Value Ref Range   Magnesium 1.8 1.7 - 2.4 mg/dL    Comment: Performed at [facility] [address] [place], [place] [address]  Glucose, capillary     Status: None   Collection Time: 11/27/22  4:46 PM  Result Value Ref Range   Glucose-Capillary 76 70 - 99 mg/dL    Comment: Glucose reference range applies only to samples taken after fasting for at least 8 hours.  Glucose, capillary     Status: None   Collection Time: 11/27/22  7:45 PM  Result Value Ref Range   Glucose-Capillary 97 70 - 99 mg/dL    Comment: Glucose reference range applies only to samples taken after fasting for at least 8 hours.  CBC     Status: None   Collection Time: 11/28/22  3:38 AM  Result Value Ref Range   WBC 10.0 4.0 - 10.5  K/uL   RBC 5.00 4.22 - 5.81 MIL/uL   Hemoglobin 14.2 13.0 - 17.0 g/dL   HCT 42.0 39.0 - 52.0 %   MCV 84.0 80.0 - 100.0 fL   MCH 28.4 26.0 - 34.0 pg   MCHC 33.8 30.0 - 36.0 g/dL   RDW 13.4 11.5 - 15.5 %   Platelets 206 150 - 400 K/uL   nRBC 0.0 0.0 - 0.2 %    Comment: Performed at [facility], [place] [address], [place], [place] [id number]  Basic metabolic panel     Status: None   Collection Time: 11/28/22  3:38 AM  Result Value Ref Range   Sodium 141 135 - 145 mmol/L   Potassium 3.6 3.5 - 5.1 mmol/L   Chloride 106 98 - 111 mmol/L   CO2 24 22 - 32 mmol/L   Glucose, Bld 87 70 - 99 mg/dL    Comment: Glucose reference range applies only to samples taken after fasting for at least 8 hours.   BUN 6 6 - 20 mg/dL   Creatinine, Ser 1.08 0.61 - 1.24 mg/dL   Calcium 9.0 8.9 - 10.3 mg/dL   GFR, Estimated >60 >60 mL/min    Comment: (NOTE) Calculated using the CKD-EPI Creatinine Equation (2021)    Anion gap 11 5 - 15    Comment: Performed at [facility] [address] [place], [place] [address]  Magnesium     Status: None   Collection Time: 11/28/22  3:38 AM  Result Value Ref Range   Magnesium 1.9 1.7 - 2.4 mg/dL    Comment: Performed at [facility] [address], [place], [place] [address]  Phosphorus     Status: None   Collection Time: 11/28/22  3:38 AM  Result Value Ref Range   Phosphorus 3.6 2.5 - 4.6 mg/dL    Comment: Performed at [facility] [address], [place], [place] [address]  Glucose, capillary     Status: None   Collection Time: 11/28/22  3:37 PM  Result Value Ref Range   Glucose-Capillary 88 70 - 99 mg/dL    Comment: Glucose reference range applies only to samples taken after fasting for at least 8 hours.  MRSA Next Gen by  PCR, Nasal     Status: None   Collection Time: 11/28/22  9:50 PM   Specimen: Nasal Mucosa; Nasal Swab  Result Value Ref Range   MRSA by PCR Next Gen NOT DETECTED NOT DETECTED    Comment: (NOTE) The GeneXpert MRSA  Assay (FDA approved for NASAL specimens only), is one component of a comprehensive MRSA colonization surveillance program. It is not intended to diagnose MRSA infection nor to guide or monitor treatment for MRSA infections. Test performance is not FDA approved in patients less than 75 years old. Performed at [facility], [place] [address], [place], [place] [id number]   Basic metabolic panel     Status: Abnormal   Collection Time: 11/29/22  2:28 AM  Result Value Ref Range   Sodium 141 135 - 145 mmol/L   Potassium 4.2 3.5 - 5.1 mmol/L   Chloride 104 98 - 111 mmol/L   CO2 24 22 - 32 mmol/L   Glucose, Bld 79 70 - 99 mg/dL    Comment: Glucose reference range applies only to samples taken after fasting for at least 8 hours.   BUN <5 (L) 6 - 20 mg/dL   Creatinine, Ser 1.14 0.61 - 1.24 mg/dL   Calcium 9.2 8.9 - 10.3 mg/dL   GFR, Estimated >60 >60 mL/min    Comment: (NOTE) Calculated using the CKD-EPI Creatinine Equation (2021)    Anion gap 13 5 - 15    Comment: Performed at [facility] [address], [place], [place] [address]  Magnesium     Status: None   Collection Time: 11/29/22  2:28 AM  Result Value Ref Range   Magnesium 2.0 1.7 - 2.4 mg/dL    Comment: Performed at [facility] [address], [place], [place] [address]    Recent Results (from the past 240 hour(s))  MRSA Next Gen by PCR, Nasal     Status: None   Collection Time: 11/28/22  9:50 PM   Specimen: Nasal Mucosa; Nasal Swab  Result Value Ref Range Status   MRSA by PCR Next Gen NOT DETECTED NOT DETECTED Final    Comment: (NOTE) The GeneXpert MRSA Assay (FDA approved for NASAL specimens only), is one component of a comprehensive MRSA colonization surveillance program. It is not intended to diagnose MRSA infection nor to guide or monitor treatment for MRSA infections. Test performance is not FDA approved in patients less than 22 years old. Performed at [facility], [place] [address],  [place], [place] [address]     Lipid Panel No results for input(s): "CHOL", "TRIG", "HDL", "CHOLHDL", "VLDL", "LDLCALC" in the last 72 hours.  Studies/Results: No results found.  Medications: Scheduled:  Chlorhexidine Gluconate Cloth  6 each Topical Q2000   cloNIDine  0.1 mg Transdermal Weekly   enoxaparin (LOVENOX) injection  40 mg Subcutaneous Daily   haloperidol  10 mg Oral Q8H   Or   haloperidol lactate  10 mg Intravenous Q8H   LORazepam  2 mg Oral Q8H   Or   LORazepam  2 mg Intravenous Q8H   OXcarbazepine  750 mg Oral BID   sterile water (preservative free)       thiamine (VITAMIN B1) injection  100 mg Intravenous Daily   Continuous:  dextrose 5% lactated ringers 125 mL/hr at 11/29/22 0800   PHENObarbital 260 mg (11/29/22 0841)   valproate sodium Stopped (11/28/22 2249)    Assessment: Patient with longstanding history of seizures on dual AEDs (valproic acid  and eslicarbazepine acetate) admitted for witnessed tonic-clinic seizure lasting approximately 1.5 minutes in the setting of recent history of poor sleep, work stressors, and inconsistent medication adherence (valproate level was notably at therapeutic range on admission, despite girlfriend endorsing recent poor compliance).  - Exam this morning reveals a sedated patient who briefly opens eyes to stimulation. No jerking, twitching, posturing, eye deviation or other seizure like activity noted.    - No clinically apparent seizures since he presented to the ED on Tuesday. However, subclinical frontal lobe seizures are on the DDx and will need to be assessed with EEG.  - Seizures may have been triggered by sleep deprivation reported for 3 days by his girlfriend stemming from anxiety regarding possible sudden death during sleep from a seizure. Also possibly manifesting a manic phase contributing to sleep deprivation. - Patient highly combative yesterday and EEG technician was unable to place leads yesterday.  - His breakthrough  seizures prior to admission were most likely secondary to partial medication compliance (suspected noncompliance with eslicarbazepine, as his VPA level on admission was therapeutic. Also suspected is possible substance abuse. His 3 days of sleep deprivation also likely lowered the seizure threshold.  - Sleep deprivation may also have contributed to his acute onset of psychosis/agitation. Characteristics of his behavior in the ED and yesterday would be compatible with an agitated delirium.  - LTM 3/7-3/8: Sharp waves, left> right frontotemporal region; Continuous slow, generalized; Excessive beta, generalized. This study showed evidence of independent epileptogenicity arising from left> right frontotemporal region. Additionally there is moderate diffuse encephalopathy, nonspecific etiology. The excessive beta activity seen in the background is most likely due to the effect of benzodiazepine and is a benign EEG pattern. No seizures or epileptiform discharges were seen throughout the recording.    Recommendations: - Seizure precautions  - Continue IV valproic acid 1000 mg BID  - Continue oxcarbazepine, if able to tolerate PO - Continue to monitor and manage agitation: Appreciate Psychiatry input.  - Phenobarbital 260 mg IV q4h - Ativan 2 mg IV/PO q8h supplemented by PRN doses at 5 mg IV q6h - Haldol 10 mg PO BID supplemented by PRN IV dosing - PRN only: - Midazolam PRN dosing 2-4 mg IV/IM q1h - Geodon PRN dosing at 10 mg IM q6h - Psychiatry is consulting. Appreciate their recommendations towards titrating the patient towards a stable, non-agitated and preferably non-sedated state.   Electronically signed: Dr. Kerney Elbe

## 2022-11-29 NOTE — Procedures (Signed)
Cortrak  Person Inserting Tube:  Roan Miklos C, RD Tube Type:  Cortrak - 43 inches Tube Size:  10 Tube Location:  Left nare Secured by: Bridle Technique Used to Measure Tube Placement:  Marking at nare/corner of mouth Cortrak Secured At:  65 cm   Cortrak Tube Team Note:  Consult received to place a Cortrak feeding tube.   X-ray is required, abdominal x-ray has been ordered by the Cortrak team. Please confirm tube placement before using the Cortrak tube.   If the tube becomes dislodged please keep the tube and contact the Cortrak team at [email or web address] for replacement.  If after hours and replacement cannot be delayed, place a NG tube and confirm placement with an abdominal x-ray.    Kynnedy Carreno P., RD, LDN, CNSC See AMiON for contact information    

## 2022-11-29 NOTE — Progress Notes (Signed)
NAME:  Dylan Moon, MRN:  [id number], DOB:  [birth year], LOS: 2 ADMISSION DATE:  11/26/2022, CONSULTATION DATE:  11/27/2022 REFERRING MD:  Dr. Gwendlyn Deutscher , CHIEF COMPLAINT: Severe agitation   History of Present Illness:  Dylan Moon is a 27 year old male with past medical history significant for seizures, ADHD, bipolar, schizoaffective disease, scoliosis, and medical noncompliance who presented to the emergency department 3/5 for seizures, felt secondary to medication noncompliance.  Patient was admitted per family medicine teaching service with neurology consultation.  Mid morning of 3/6 patient progressively became more violent and agitated towards staff resulting in security being called, application of violent restraints, and eventually addition of chemical restraints as well. Given need for close monitoring of mentation and airway after chemical restraints PCCM was consulted    Pertinent  Medical History  Seizures, ADHD, bipolar, schizoaffective disease, scoliosis, and medical noncompliance  Significant Hospital Events: Including procedures, antibiotic start and stop dates in addition to other pertinent events   3/5 presented to ED after 1.5-minute tonic-clonic seizure per family 3/6 severely agitated and combative towards staff resulting in both violent and chemical restraints  3/8 continued severe agitation with escalating behavior and requiring multiple sedating medications until last night, calm this morning   Interim History / Subjective:  Medication regimen seems to now be working, he is resting and calm  Objective   Blood pressure 121/74, pulse 72, temperature 98 F (36.7 C), temperature source Axillary, resp. rate (!) 23, weight 85.2 kg, SpO2 100 %.        Intake/Output Summary (Last 24 hours) at 11/29/2022 0740 Last data filed at 11/29/2022 0700 Gross per 24 hour  Intake 3953.97 ml  Output 1100 ml  Net 2853.97 ml    Filed Weights   11/28/22 0500 11/29/22 0500  Weight:  84.7 kg 85.2 kg    General:  well-nourished young M, sleeping in no distress HEENT: MM pink/moist, sclera anicteric Neuro: currently resting, rouses with stimulation CV: s1s2 rrr, no m/r/g PULM:  clear bilaterally with equal chest rise and no distress GI: soft, non-distended Extremities: warm/dry, no edema  Skin: no rashes or lesions     Resolved Hospital Problem list   AKI  Assessment & Plan:     Epilepsy with longstanding seizure history, vagal nerve stimulator. -con't PTA eslicarbazepine, which he has been refusing -seizure precautions -ativan PRN for seizures -LTM complete this AM, EEG read yesterday without seizure acitivity -appreciate neurology following    History of medication noncompliance Acute psychosis with aggressive behavior -his previous outpatient diagnoses of ADHD and anxiety do not fully explain his presentation, which appears to be schizoaffective disorder, bipolar disorder, or post-ictal psychosis -severe aggression and agitation has been very difficult to control over the last two days requiring benzos, anti-psychotics, benadryl and ketamine -he is calm this morning, monitor and begin decreasing medications as able if his agitation remains under control today -Appreciate Psychiatry's management -due to very aggressive and self-harming behaviors, he still requires restraints unfortunately.  -con't schedled antipsychotics + PRN's -con't phenobarbital scheduled to limit aggressive behavior -con't versed PRN for breakthrough agitation -Qtc monitoring -ETCO2 with use of respiratory suppressing meds until a better regimen is found or he demonstrates tolerance  -safety sitter  1:1  AKI  resolved with hydration -con't low-dose LR until PO intake improves  Best Practice (right click and "Reselect all SmartList Selections" daily)   Diet/type: NPO until mental status improves DVT prophylaxis: LMWH GI prophylaxis: N/A Lines: N/A Foley:  N/A Code Status:  full code Last date of multidisciplinary goals of care discussion: Family updated at bedside 3/6, pending 3/8  Labs   CBC: Recent Labs  Lab 11/26/22 1142 11/27/22 1006 11/28/22 0338  WBC 11.6* 9.6 10.0  HGB 15.5 14.2 14.2  HCT 47.0 43.6 42.0  MCV 84.8 85.7 84.0  PLT 250 229 206     Basic Metabolic Panel: Recent Labs  Lab 11/26/22 1148 11/27/22 1006 11/28/22 0338 11/29/22 0228  NA 137 139 141 141  K 5.0 3.4* 3.6 4.2  CL 103 98 106 104  CO2 22 17* 24 24  GLUCOSE 83 98 87 79  BUN '6 9 6 '$ <5*  CREATININE 1.20 1.48* 1.08 1.14  CALCIUM 9.8 9.4 9.0 9.2  MG 2.2 1.8 1.9 2.0  PHOS  --   --  3.6  --     GFR: Estimated Creatinine Clearance: 103.7 mL/min (by C-G formula based on SCr of 1.14 mg/dL). Recent Labs  Lab 11/26/22 1142 11/27/22 1006 11/28/22 0338  WBC 11.6* 9.6 10.0     Liver Function Tests: Recent Labs  Lab 11/26/22 1148 11/27/22 1006  AST 41 54*  ALT 22 22  ALKPHOS 40 39  BILITOT 0.6 1.2  PROT 7.3 7.2  ALBUMIN 4.5 4.4    Recent Labs  Lab 11/26/22 1344 11/27/22 1646 11/27/22 1945 11/28/22 1537  GLUCAP 88 76 97 88    Critical care time:  38 minutes   CRITICAL CARE Performed by: Otilio Carpen Jimesha Rising   Total critical care time: 38 minutes  Critical care time was exclusive of separately billable procedures and treating other patients.  Critical care was necessary to treat or prevent imminent or life-threatening deterioration.  Critical care was time spent personally by me on the following activities: development of treatment plan with patient and/or surrogate as well as nursing, discussions with consultants, evaluation of patient's response to treatment, examination of patient, obtaining history from patient or surrogate, ordering and performing treatments and interventions, ordering and review of laboratory studies, ordering and review of radiographic studies, pulse oximetry and re-evaluation of patient's condition.   Otilio Carpen Parish [place]ose,  PA-C [facility] See Amion for pager If no response to pager , please call [phone] until 7pm After 7:00 pm call Elink  [phone]?Whitney

## 2022-11-29 NOTE — Progress Notes (Signed)
Initial Nutrition Assessment  DOCUMENTATION CODES:   Not applicable  INTERVENTION:   Initiate tube feeding via Cortrak tube: Osmolite 1.5 at 25 ml/h and increase by 10 ml every 8 hours to goal rate of 65 ml/hr (1560 ml per day) Prosource TF20 60 ml daily  Provides 2420 kcal, 117 gm protein, 1185 ml free water daily   NUTRITION DIAGNOSIS:   Inadequate oral intake related to inability to eat as evidenced by NPO status.  GOAL:   Patient will meet greater than or equal to 90% of their needs  MONITOR:   TF tolerance, Diet advancement  REASON FOR ASSESSMENT:   Consult Enteral/tube feeding initiation and management  ASSESSMENT:   Pt with PMH of sz, ADHD, bipolar, schizoaffective dz, scoliosis, marijuana use, and medical noncompliance admitted 3/5 for seizures possibly due to medical noncompliance.   Spoke with MD and RN.  Pt has been agitated/very aggressive, more calm today.  Spoke with pt's mom and girlfriend. Per chart review and mom pt lost about 30 lb over 7 months but was most likely due to having previously gained weight with a medication and once stopping this medication pt lost the weight.  Pt is described as a very picky eater. Typically eats 2 meals per day Late Breakfast: at mom's usually sausage, eggs, grits Dinner: Usually cookout, buffalo wings, or eats at mom's which is a meat, starch, and veggies Pt drinks water, gatorade, and OJ   03/08 - s/p cortrak placement; xray pending    Medications reviewed and include: thiamine  D5 @ 125 ml/hr  Labs reviewed:  CBG: 88  Admission weight: 84.7 kg   NUTRITION - FOCUSED PHYSICAL EXAM:  Flowsheet Row Most Recent Value  Orbital Region No depletion  Upper Arm Region No depletion  Thoracic and Lumbar Region No depletion  Buccal Region No depletion  Temple Region No depletion  Clavicle Bone Region No depletion  Clavicle and Acromion Bone Region No depletion  Scapular Bone Region Unable to assess  Dorsal Hand  Unable to assess  Patellar Region No depletion  Anterior Thigh Region No depletion  Posterior Calf Region No depletion  Edema (RD Assessment) None  Hair Reviewed  Eyes Unable to assess  Mouth Unable to assess  Skin Reviewed  Nails Unable to assess       Diet Order:   Diet Order             Diet regular Room service appropriate? Yes; Fluid consistency: Thin  Diet effective now                   EDUCATION NEEDS:   No education needs have been identified at this time  Skin:  Skin Assessment: Reviewed RN Assessment  Last BM:  unknown  Height:   Ht Readings from Last 1 Encounters:  11/19/22 '5\' 11"'$  (1.803 m)    Weight:   Wt Readings from Last 1 Encounters:  11/29/22 85.2 kg    BMI:  Body mass index is 26.2 kg/m.  Estimated Nutritional Needs:   Kcal:  2200-2500  Protein:  105-125 grams  Fluid:  >2 L/day  Lockie Pares., RD, LDN, CNSC See AMiON for contact information

## 2022-11-29 NOTE — Progress Notes (Signed)
OT Cancellation Note  Patient Details Name: Dylan Moon MRN: [id number] DOB: 1996/01/29   Cancelled Treatment:    Reason Eval/Treat Not Completed: Medical issues which prohibited therapy pt lethargic, per chart review was agitated overnight requiring medication to address. OT to sign off per rehab protocol of x3 cancels in a row, please reconsult when appropriate.   Layla Maw 11/29/2022, 10:07 AM

## 2022-11-30 DIAGNOSIS — G40909 Epilepsy, unspecified, not intractable, without status epilepticus: Secondary | ICD-10-CM | POA: Diagnosis not present

## 2022-11-30 DIAGNOSIS — R569 Unspecified convulsions: Secondary | ICD-10-CM | POA: Diagnosis not present

## 2022-11-30 LAB — CBC
HCT: 43.6 % (ref 39.0–52.0)
Hemoglobin: 14.1 g/dL (ref 13.0–17.0)
MCH: 27.8 pg (ref 26.0–34.0)
MCHC: 32.3 g/dL (ref 30.0–36.0)
MCV: 86 fL (ref 80.0–100.0)
Platelets: 206 10*3/uL (ref 150–400)
RBC: 5.07 MIL/uL (ref 4.22–5.81)
RDW: 13.3 % (ref 11.5–15.5)
WBC: 8.2 10*3/uL (ref 4.0–10.5)
nRBC: 0 % (ref 0.0–0.2)

## 2022-11-30 LAB — GLUCOSE, CAPILLARY
Glucose-Capillary: 87 mg/dL (ref 70–99)
Glucose-Capillary: 121 mg/dL — ABNORMAL HIGH (ref 70–99)
Glucose-Capillary: 122 mg/dL — ABNORMAL HIGH (ref 70–99)
Glucose-Capillary: 137 mg/dL — ABNORMAL HIGH (ref 70–99)
Glucose-Capillary: 109 mg/dL — ABNORMAL HIGH (ref 70–99)
Glucose-Capillary: 140 mg/dL — ABNORMAL HIGH (ref 70–99)

## 2022-11-30 LAB — BASIC METABOLIC PANEL
Anion gap: 13 (ref 5–15)
BUN: 6 mg/dL (ref 6–20)
CO2: 21 mmol/L — ABNORMAL LOW (ref 22–32)
Calcium: 8.8 mg/dL — ABNORMAL LOW (ref 8.9–10.3)
Chloride: 106 mmol/L (ref 98–111)
Creatinine, Ser: 1.11 mg/dL (ref 0.61–1.24)
GFR, Estimated: >60 mL/min (ref >60)
Glucose, Bld: 84 mg/dL (ref 70–99)
Potassium: 4 mmol/L (ref 3.5–5.1)
Sodium: 140 mmol/L (ref 135–145)

## 2022-11-30 LAB — VALPROIC ACID LEVEL: Valproic Acid Lvl: 107 ug/mL — ABNORMAL HIGH (ref 50.0–100.0)

## 2022-11-30 LAB — PHOSPHORUS: Phosphorus: 3.1 mg/dL (ref 2.5–4.6)

## 2022-11-30 LAB — MAGNESIUM: Magnesium: 1.9 mg/dL (ref 1.7–2.4)

## 2022-11-30 MED ORDER — LORAZEPAM 1 MG PO TABS
2.0000 mg | ORAL_TABLET | Freq: Three times a day (TID) | ORAL | Status: DC
  Administered 2022-11-30 – 2022-12-02 (×5): 2 mg
  Filled 2022-11-30 (×5): qty 2

## 2022-11-30 MED ORDER — HALOPERIDOL 5 MG PO TABS
5.0000 mg | ORAL_TABLET | Freq: Three times a day (TID) | ORAL | Status: DC
  Filled 2022-11-30 (×2): qty 1

## 2022-11-30 MED ORDER — ORAL CARE MOUTH RINSE
15.0000 mL | OROMUCOSAL | Status: DC
  Administered 2022-11-30 – 2022-12-07 (×31): 15 mL via OROMUCOSAL

## 2022-11-30 MED ORDER — HALOPERIDOL LACTATE 5 MG/ML IJ SOLN
10.0000 mg | Freq: Three times a day (TID) | INTRAMUSCULAR | Status: DC
  Filled 2022-11-30: qty 2

## 2022-11-30 MED ORDER — PHENOBARBITAL SODIUM 65 MG/ML IJ SOLN
65.0000 mg | INTRAMUSCULAR | Status: DC
  Administered 2022-11-30 – 2022-12-01 (×6): 65 mg via INTRAVENOUS
  Filled 2022-11-30 (×6): qty 1

## 2022-11-30 MED ORDER — HALOPERIDOL 5 MG PO TABS
5.0000 mg | ORAL_TABLET | Freq: Three times a day (TID) | ORAL | Status: DC
  Administered 2022-11-30: 5 mg
  Filled 2022-11-30 (×3): qty 1

## 2022-11-30 MED ORDER — MAGNESIUM SULFATE 2 GM/50ML IV SOLN
2.0000 g | Freq: Once | INTRAVENOUS | Status: AC
  Administered 2022-11-30: 2 g via INTRAVENOUS
  Filled 2022-11-30: qty 50

## 2022-11-30 MED ORDER — PALIPERIDONE ER 1.5 MG PO TB24: 1.5000 mg | ORAL_TABLET | Freq: Every day | ORAL | Status: DC

## 2022-11-30 MED ORDER — RISPERIDONE 1 MG/ML PO SOLN
1.0000 mg | Freq: Two times a day (BID) | ORAL | Status: DC
  Administered 2022-11-30: 1 mg via ORAL
  Filled 2022-11-30 (×2): qty 1

## 2022-11-30 MED ORDER — LORAZEPAM 2 MG/ML IJ SOLN
2.0000 mg | Freq: Three times a day (TID) | INTRAMUSCULAR | Status: DC
  Administered 2022-12-01: 2 mg via INTRAVENOUS
  Filled 2022-11-30: qty 1

## 2022-11-30 MED ORDER — PALIPERIDONE ER 3 MG PO TB24: 3.0000 mg | ORAL_TABLET | Freq: Every day | ORAL | Status: DC

## 2022-11-30 MED ORDER — CHLORHEXIDINE GLUCONATE CLOTH 2 % EX PADS
6.0000 | MEDICATED_PAD | Freq: Every day | CUTANEOUS | Status: DC
  Administered 2022-11-30 – 2022-12-08 (×9): 6 via TOPICAL

## 2022-11-30 MED ORDER — ORAL CARE MOUTH RINSE: 15.0000 mL | OROMUCOSAL | Status: DC | PRN

## 2022-11-30 NOTE — Consult Note (Addendum)
Addendum: was able to get in touch with mom, who consented to swap to risperidal (similar to prev effective paliperidone). She doesn't feel like haldol has ever really worked for him. I ordered a solution so hopefully it can go thorough Coretrak.   [facility] Face-to-Face Psychiatric Evaluation   Service Date: November 30, 2022 LOS:  LOS: 3 days  Reason for Consult: "suicidal attempt" Consult by: Donney Dice  Assessment  Dylan Moon is a 26 y.o. male with hx of PTSD, ADHD, ?schizoaffective disorder, depression, anxiety, and seizures, admitted medically for 11/26/2022 11:30 AM for seizures.   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 He presented to the hospital for seizures; psychiatry is involved for symptomatic management of agitation. Generally has improved over past 2-3 days since transfer to ICU and initiation of phenobarbital. Medications are limited by Coretrak (cannot administer previously effective invega). He is currently on haldol 5 TID; had gotten consent from mom to swap to PO invega before pharmacy correctly discontinued it d/t it cannot be crushed. Called mom to see about switching to risperidone with no answer; as pt has actually significantly improved from last eval will leave meds as is for today.    Patien was sedated at time of my eval. Primary team had again decreased haldol.  No seizures noted on EEG but "independent epileptogenicity arising from left> right frontotemporal region" was noted. Initially was planning to add risperidone or zyprexa to stabilize patient further in lieu of other more sedating medications but given patient's sedation, reasonable to see if patient less agitated at this time and could come off restraints.   He is currently under IVC and  would recommend continuing it along with 1:1 sitter. At this point given confusion and agitation at least partially due to postictal state, cannot determine if inpt psych will benefit pt. Will reassess this daily. Differential remains the same as yesterday (schizoaffective disorder vs bipolar disorder vs postictal psychosis vs substance induced psychosis).   Plan  ## Safety and Observation Level:  - Based on my clinical evaluation, I estimate the patient to be at high risk of self harm in the current setting. Recommend 1:1 sitter and restraints as needed.  #Severe Agitation #Seizures #Bipolar disorder #hx of PTSD #hx of ADHD Bipolar Disorder vs Postictal psychosis vs schizoaffective disorder vs substance induced psychosis -Geodon 10 mg IV q6h prn agitation  -Ativan 2 mg IV or PO q8h scheduled - consider decrease tomorrow  -Haldol decreased to 5 mg PO q8 scheduled  -Continue Clonidine patch 0.1 mg weekly -Defer depcaon and phenobarbital dosing to neuro and primary   - put in depakote level   - do not want to start second antipsychotic at this time.   -1:1 sitter Pertinent labs: ECG 11/27/22 NSR Qtc 437, mild increase in Cr 1.2>1.48, CBC wnl  ## Disposition:  -- pending -- reach out to [facility] for urgent appt prior to dc.   Thank you for this consult request. Recommendations have been communicated to the primary team.  We will continue to follow at this time.   Dylan Moon   Relevant History  Relevant Aspects of Hospital Course:  Admitted on 11/26/2022 for seizures 3/6 rapid called due to severe agitation. Admitted to ICU and started on phenobarbital.  Patient Report:  Pt sedated when I examined;  did not wake to voice, light touch, or shaking on shoulder. Clearly protecting airway at time of my exam. Did not wake up as felt it would be harmful to pt (tends to become agitated when woken up disoriented, perpetuating delirium-->agitation-->PRN-->sedation cycle).   Collateral  information:  Spoke with girlfriend in room; answered questions.   Spoke to mother who asked several times for invega shot, explained this is not possible (need to prove oral tolerability/efficacy) but thanked her for advocating.   Psychiatric History:  Never been psychiatrically hospitalized. He did establish care with Dr. Hassell Done at [facility] who diagnosed patient with Cannabis use disorder, GAD w/ panic attacks, and PTSD and he was started on mirtazapine. Sleep cycling not disclosed at that appt.    Social History:  Tobacco use: 3 black and mild per day Alcohol use: denies Drug use: Vape THC and smokes 3-4 joints of marijuana per day  Family History:  The patient's family history includes Cancer in an other family member; Diabetes in his father and mother; Hypertension in his mother; Seizures in his maternal grandfather.  Medical History: Past Medical History:  Diagnosis Date   ADHD    Bipolar 1 disorder ([place])    Schizoaffective disorder ([place])    Scoliosis    Seizures ([place])    most recent 12/02/17    Surgical History: Past Surgical History:  Procedure Laterality Date   NO PAST SURGERIES      Medications:   Current Facility-Administered Medications:    acetaminophen (TYLENOL) tablet 650 mg, 650 mg, Oral, Q4H PRN, Noemi Chapel P, DO   Chlorhexidine Gluconate Cloth 2 % PADS 6 each, 6 each, Topical, Daily, Olalere, Adewale A, MD, 6 each at 11/30/22 0946   cloNIDine (CATAPRES - Dosed in mg/24 hr) patch 0.1 mg, 0.1 mg, Transdermal, Weekly, France Ravens, MD, 0.1 mg at 11/28/22 1117   diphenhydrAMINE (BENADRYL) injection 50 mg, 50 mg, Intravenous, Q6H PRN, Bowser, Laurel Dimmer, NP, 50 mg at 11/30/22 0941   docusate sodium (COLACE) capsule 100 mg, 100 mg, Oral, BID PRN, Noemi Chapel P, DO   enoxaparin (LOVENOX) injection 40 mg, 40 mg, Subcutaneous, Daily, Noemi Chapel P, DO, 40 mg at [id number]   Eslicarbazepine Acetate TABS 1,600 mg, 1,600 mg, Per Tube, Daily, Olalere,  Adewale A, MD, 1,600 mg at 11/30/22 1046   feeding supplement (OSMOLITE 1.5 CAL) liquid 1,000 mL, 1,000 mL, Per Tube, Continuous, Olalere, Adewale A, MD, Last Rate: 65 mL/hr at 11/30/22 1600, Infusion Verify at 11/30/22 1600   feeding supplement (PROSource TF20) liquid 60 mL, 60 mL, Per Tube, Daily, Olalere, Adewale A, MD, 60 mL at 11/30/22 0941   haloperidol (HALDOL) tablet 5 mg, 5 mg, Per Tube, Q8H, Omar Person, NP, 5 mg at 11/30/22 1516   LORazepam (ATIVAN) tablet 2 mg, 2 mg, Per Tube, Q8H **OR** LORazepam (ATIVAN) injection 2 mg, 2 mg, Intravenous, Q8H, Olalere, Adewale A, MD   LORazepam (ATIVAN) injection 4 mg, 4 mg, Intravenous, PRN, Eppie Gibson, MD   midazolam (VERSED) injection 2-4 mg, 2-4 mg, Intravenous, Q1H PRN, 4 mg at 11/29/22 0511 **OR** midazolam (VERSED) injection 2-4 mg, 2-4 mg, Intramuscular, Q1H PRN, Julian Hy, DO   Oral care mouth rinse, 15 mL, Mouth Rinse, 4 times per day, Olalere, Adewale A, MD, 15 mL at 11/30/22 1517   Oral care mouth rinse, 15 mL, Mouth Rinse, PRN, Olalere, Adewale A, MD   PHENObarbital (LUMINAL) injection 65 mg, 65 mg, Intravenous, Q4H, Eubanks, Katalina M,  NP, 65 mg at 11/30/22 1622   polyethylene glycol (MIRALAX / GLYCOLAX) packet 17 g, 17 g, Oral, Daily PRN, Noemi Chapel P, DO   thiamine (VITAMIN B1) injection 100 mg, 100 mg, Intravenous, Daily, Noemi Chapel P, DO, 100 mg at 11/30/22 0941   valproate (DEPACON) 1,000 mg in dextrose 5 % 50 mL IVPB, 1,000 mg, Intravenous, Q12H, Kerney Elbe, MD, Stopped at 11/30/22 1047   white petrolatum (VASELINE) gel, , Topical, PRN, Noemi Chapel P, DO   ziprasidone (GEODON) injection 10 mg, 10 mg, Intramuscular, Q4H PRN, Paliwal, Aditya, MD, 10 mg at 11/29/22 0157  Allergies: Allergies  Allergen Reactions   Keppra [Levetiracetam] Other (See Comments)    Irritability    Tramadol Other (See Comments)    Contraindicated with current medications (??)   Vimpat [Lacosamide] Other (See Comments)     Causes anger       Objective  Vital signs:  Temp:  [97.7 F (36.5 C)-100.1 F (37.8 C)] 97.7 F (36.5 C) (03/09 1512) Pulse Rate:  [72-103] 77 (03/09 1600) Resp:  [22-37] 25 (03/09 1600) BP: (103-148)/(53-88) 113/65 (03/09 1600) SpO2:  [100 %] 100 % (03/09 1600) Weight:  [86.5 kg] 86.5 kg (03/09 0459)  Psychiatric Specialty Exam:  Presentation  General Appearance: Disheveled Eye Contact: none Speech: none  Speech Volume: n/a  Mood and Affect  Mood: did not assess Affect: somnolent  Thought Process  Thought Processes: unable to assess Descriptions of Associations: unable to assess Orientation: not assessed Thought Content:unable to assess Hallucinations: unable to assess Ideas of Reference: unable to assess Suicidal Thoughts: unable to assess Homicidal Thoughts: unable to assess  Sensorium  Memory:unable to assess Judgment: unable to assess Insight: unable to assess  Executive Functions  Concentration: unable to assess Attention Span:unable to assess Recall:unable to assess Fund of Knowledge: unable to assess Language: unable to assess  Psychomotor Activity  Psychomotor Activity: decreased   Sleep  Sleep: oversedated  Unable to perform physical exam, in 4 point restraints recently agitated.

## 2022-11-30 NOTE — Progress Notes (Signed)
LTM EEG discontinued - no skin breakdown at unhook.   

## 2022-11-30 NOTE — Procedures (Addendum)
Patient Name: Dylan Moon  MRN: [id number]  Epilepsy Attending: Lora Havens  Referring Physician/Provider: Julian Hy, DO  Duration: 11/29/2022 [id number] to 11/30/2022 [id number]   Patient history: 27yo M with ams. EEG to evaluate for seizure   Level of alertness: Awake, asleep   AEDs during EEG study: OXC, Phenobarb, VPA   Technical aspects: This EEG study was done with scalp electrodes positioned according to the 10-20 International system of electrode placement. Electrical activity was reviewed with band pass filter of 1-'70Hz'$ , sensitivity of 7 uV/mm, display speed of 51m/sec with a '60Hz'$  notched filter applied as appropriate. EEG data were recorded continuously and digitally stored.  Video monitoring was available and reviewed as appropriate.   Description: No clear posterior dominant rhythm was seen. Sleep was characterized by vertex waves, sleep spindles (12 to 14 Hz), maximal frontocentral region.  EEG showed continuous generalized 3 to 6 Hz theta-delta slowing admixed with an excessive amount of 13-15 Hz beta activity distributed symmetrically and diffusely. Hyperventilation and photic stimulation were not performed.      ABNORMALITY - Continuous slow, generalized - Excessive beta, generalized   IMPRESSION: This study is suggestive of moderate diffuse encephalopathy, nonspecific etiology. The excessive beta activity seen in the background is most likely due to the effect of benzodiazepine and is a benign EEG pattern. No seizures or definite epileptiform discharges were seen throughout the recording.  Please note lack of epileptiform activity doesn't exclude the diagnosis of epilepsy    [facility]

## 2022-11-30 NOTE — Progress Notes (Signed)
NAME:  Dylan Moon, MRN:  [id number], DOB:  31-Jan-1996, LOS: 3 ADMISSION DATE:  11/26/2022, CONSULTATION DATE:  11/27/2022 REFERRING MD:  Dr. Gwendlyn Deutscher , CHIEF COMPLAINT: Severe agitation   History of Present Illness:  Dylan Moon is a 27 year old male with past medical history significant for seizures, ADHD, bipolar, schizoaffective disease, scoliosis, and medical noncompliance who presented to the emergency department 3/5 for seizures, felt secondary to medication noncompliance.  Patient was admitted per family medicine teaching service with neurology consultation.  Mid morning of 3/6 patient progressively became more violent and agitated towards staff resulting in security being called, application of violent restraints, and eventually addition of chemical restraints as well. Given need for close monitoring of mentation and airway after chemical restraints PCCM was consulted    Pertinent  Medical History  Seizures, ADHD, bipolar, schizoaffective disease, scoliosis, and medical noncompliance  Significant Hospital Events: Including procedures, antibiotic start and stop dates in addition to other pertinent events   3/5 presented to ED after 1.5-minute tonic-clonic seizure per family 3/6 severely agitated and combative towards staff resulting in both violent and chemical restraints  3/8 continued severe agitation with escalating behavior and requiring multiple sedating medications until last night, calm this morning   Interim History / Subjective:  This AM remains in restraints. Did not required PRN medications overnight for agitation   Objective   Blood pressure 128/74, pulse 79, temperature 98.3 F (36.8 C), temperature source Axillary, resp. rate (!) 31, weight 86.5 kg, SpO2 100 %.        Intake/Output Summary (Last 24 hours) at 11/30/2022 0854 Last data filed at 11/30/2022 0600 Gross per 24 hour  Intake 3124.3 ml  Output 1640 ml  Net 1484.3 ml   Filed Weights   11/28/22 0500  11/29/22 0500 11/30/22 0459  Weight: 84.7 kg 85.2 kg 86.5 kg   General:  Young adult male, lying in bed, no distress  HEENT: Dry MM  Neuro: awakens with verbal stimulation, follows simple commands, some verbal response, not oriented   CV: s1s2 rrr, no m/r/g PULM:  clear bilaterally,no use of accessory muscles  GI: soft, distended, active bowel sounds  Extremities: warm/dry, no edema  Skin: no rashes or lesions  Resolved Hospital Problem list   AKI  Assessment & Plan:  Epilepsy with longstanding seizure history, vagal nerve stimulator. Plan -con't PTA eslicarbazepine -seizure precautions -ativan PRN for seizures -LTM complete this AM, EEG read without seizure acitivity -appreciate neurology following   History of medication noncompliance Acute psychosis with aggressive behavior -his previous outpatient diagnoses of ADHD and anxiety do not fully explain his presentation, which appears to be schizoaffective disorder, bipolar disorder, or post-ictal psychosis -severe aggression and agitation has been very difficult to control over the last two days requiring benzos, anti-psychotics, benadryl and ketamine Plan - Appreciate Psychiatry's management - due to very aggressive and self-harming behaviors, he still requires restraints - Continue IV valproic acid 1000 mg BID  - Continue oxcarbazepine - Continue Clonidine 0.1 patch  - Phenobarbital 130 mg IV q4h >> decrease to 65 mg q4h  - Ativan 2 mg IV/PO q8h supplemented by PRN doses at 5 mg IV q6h - Haldol 5 mg PO q8h supplemented by PRN IV dosing - PRN only: - Midazolam PRN dosing 2-4 mg IV/IM q1h - Geodon PRN dosing at 10 mg IM q6h -Qtc monitoring -safety sitter  1:1  AKI  resolved with hydration Plan -D/C LR as tolerating TF  -Morning labs pending   Best  Practice (right click and "Reselect all SmartList Selections" daily)   Diet/type: NPO until mental status improves DVT prophylaxis: LMWH GI prophylaxis: N/A Lines:  N/A Foley:  External cath in place  Code Status:  full code Last date of multidisciplinary goals of care discussion: Family updated at bedside 3/6, pending 3/8  Labs   CBC: Recent Labs  Lab 11/26/22 1142 11/27/22 1006 11/28/22 0338  WBC 11.6* 9.6 10.0  HGB 15.5 14.2 14.2  HCT 47.0 43.6 42.0  MCV 84.8 85.7 84.0  PLT 250 229 [id number]    Basic Metabolic Panel: Recent Labs  Lab 11/26/22 1148 11/27/22 1006 11/28/22 0338 11/29/22 0228 11/29/22 1809 11/30/22 0609  NA 137 139 141 141  --   --   K 5.0 3.4* 3.6 4.2  --   --   CL 103 98 106 104  --   --   CO2 22 17* 24 24  --   --   GLUCOSE 83 98 87 79  --   --   BUN '6 9 6 '$ <5*  --   --   CREATININE 1.20 1.48* 1.08 1.14  --   --   CALCIUM 9.8 9.4 9.0 9.2  --   --   MG 2.2 1.8 1.9 2.0  --  1.9  PHOS  --   --  3.6 5.1* 3.4 3.1   GFR: Estimated Creatinine Clearance: 103.7 mL/min (by C-G formula based on SCr of 1.14 mg/dL). Recent Labs  Lab 11/26/22 1142 11/27/22 1006 11/28/22 0338  WBC 11.6* 9.6 10.0    Liver Function Tests: Recent Labs  Lab 11/26/22 1148 11/27/22 1006  AST 41 54*  ALT 22 22  ALKPHOS 40 39  BILITOT 0.6 1.2  PROT 7.3 7.2  ALBUMIN 4.5 4.4   Recent Labs  Lab 11/29/22 1516 11/29/22 1914 11/29/22 2338 11/30/22 0305 11/30/22 0738  GLUCAP 125* 108* 127* 87 121*   Critical care time:  32 minutes   CRITICAL CARE Performed by: Omar Person   Total critical care time: 32 minutes  Critical care time was exclusive of separately billable procedures and treating other patients.  Critical care was necessary to treat or prevent imminent or life-threatening deterioration.  Critical care was time spent personally by me on the following activities: development of treatment plan with patient and/or surrogate as well as nursing, discussions with consultants, evaluation of patient's response to treatment, examination of patient, obtaining history from patient or surrogate, ordering and performing  treatments and interventions, ordering and review of laboratory studies, ordering and review of radiographic studies, pulse oximetry and re-evaluation of patient's condition.  Hayden Pedro, AGACNP-BC [facility]  PCCM Pgr: [phone]

## 2022-11-30 NOTE — Progress Notes (Signed)
Subjective: LTM EEG has been discontinued.   Objective: Current vital signs: BP 128/74   Pulse 79   Temp 98.3 F (36.8 C) (Axillary)   Resp (!) 31   Wt 86.5 kg   SpO2 100%   BMI 26.60 kg/m  Vital signs in last 24 hours: Temp:  [98.2 F (36.8 C)-100.8 F (38.2 C)] 98.3 F (36.8 C) (03/09 0740) Pulse Rate:  [72-105] 79 (03/09 0600) Resp:  [20-37] 31 (03/09 0600) BP: (106-157)/(53-88) 128/74 (03/09 0600) SpO2:  [100 %] 100 % (03/09 0600) Weight:  [86.5 kg] 86.5 kg (03/09 0459)  Intake/Output from previous day: 03/08 0701 - 03/09 0700 In: 3249.3 [I.V.:1830.7; NG/GT:1098.5; IV Piggyback:320.1] Out: 1640 [Urine:1640] Intake/Output this shift: No intake/output data recorded. Nutritional status:  Diet Order             Diet regular Room service appropriate? Yes; Fluid consistency: Thin  Diet effective now                  HEENT: [place]/AT Lungs: Snoring while asleep Ext: No edema   Neurologic Exam: Ment: Somnolent and sedated. Briefly opened eyes slightly with stimulation. Murmurs semicoherently. Did not awaken sufficiently to converse or follow commands.  CN: Eyes are conjugate. Face symmetric.  Motor: Moving around in bed with restraints on, attempting to get out. All 4 limbs are with essentially equal movement, but patient is not awake enough to follow commands for formal motor testing.   Sensory: Responds to noxious stimuli  Reflexes: Normoactive.  Cerebellar/Gait: Unable to assess  Lab Results: Results for orders placed or performed during the hospital encounter of 11/26/22 (from the past 48 hour(s))  Glucose, capillary     Status: None   Collection Time: 11/28/22  3:37 PM  Result Value Ref Range   Glucose-Capillary 88 70 - 99 mg/dL    Comment: Glucose reference range applies only to samples taken after fasting for at least 8 hours.  MRSA Next Gen by PCR, Nasal     Status: None   Collection Time: 11/28/22  9:50 PM   Specimen: Nasal Mucosa; Nasal Swab  Result  Value Ref Range   MRSA by PCR Next Gen NOT DETECTED NOT DETECTED    Comment: (NOTE) The GeneXpert MRSA Assay (FDA approved for NASAL specimens only), is one component of a comprehensive MRSA colonization surveillance program. It is not intended to diagnose MRSA infection nor to guide or monitor treatment for MRSA infections. Test performance is not FDA approved in patients less than 52 years old. Performed at [facility], [place] [address] [place], [place] [id number]   Basic metabolic panel     Status: Abnormal   Collection Time: 11/29/22  2:28 AM  Result Value Ref Range   Sodium 141 135 - 145 mmol/L   Potassium 4.2 3.5 - 5.1 mmol/L   Chloride 104 98 - 111 mmol/L   CO2 24 22 - 32 mmol/L   Glucose, Bld 79 70 - 99 mg/dL    Comment: Glucose reference range applies only to samples taken after fasting for at least 8 hours.   BUN <5 (L) 6 - 20 mg/dL   Creatinine, Ser 1.14 0.61 - 1.24 mg/dL   Calcium 9.2 8.9 - 10.3 mg/dL   GFR, Estimated >60 >60 mL/min    Comment: (NOTE) Calculated using the CKD-EPI Creatinine Equation (2021)    Anion gap 13 5 - 15    Comment: Performed at [facility] [address] [place], [place]  [id number]  Magnesium     Status: None   Collection Time: 11/29/22  2:28 AM  Result Value Ref Range   Magnesium 2.0 1.7 - 2.4 mg/dL    Comment: Performed at [facility], [place] [address] [place], [place] [address]  Phosphorus     Status: Abnormal   Collection Time: 11/29/22  2:28 AM  Result Value Ref Range   Phosphorus 5.1 (H) 2.5 - 4.6 mg/dL    Comment: Performed at [facility] [address] [place], [place] [address]  Glucose, capillary     Status: Abnormal   Collection Time: 11/29/22  3:16 PM  Result Value Ref Range   Glucose-Capillary 125 (H) 70 - 99 mg/dL    Comment: Glucose reference range applies only to samples taken after fasting for at least 8 hours.  Phosphorus     Status: None   Collection Time: 11/29/22  6:09 PM  Result Value  Ref Range   Phosphorus 3.4 2.5 - 4.6 mg/dL    Comment: Performed at [facility], [place] [address] [place], [place] [address]  Glucose, capillary     Status: Abnormal   Collection Time: 11/29/22  7:14 PM  Result Value Ref Range   Glucose-Capillary 108 (H) 70 - 99 mg/dL    Comment: Glucose reference range applies only to samples taken after fasting for at least 8 hours.  Glucose, capillary     Status: Abnormal   Collection Time: 11/29/22 11:38 PM  Result Value Ref Range   Glucose-Capillary 127 (H) 70 - 99 mg/dL    Comment: Glucose reference range applies only to samples taken after fasting for at least 8 hours.   Comment 1 Notify RN    Comment 2 Document in Chart   Glucose, capillary     Status: None   Collection Time: 11/30/22  3:05 AM  Result Value Ref Range   Glucose-Capillary 87 70 - 99 mg/dL    Comment: Glucose reference range applies only to samples taken after fasting for at least 8 hours.   Comment 1 Notify RN    Comment 2 Document in Chart   Magnesium     Status: None   Collection Time: 11/30/22  6:09 AM  Result Value Ref Range   Magnesium 1.9 1.7 - 2.4 mg/dL    Comment: Performed at [facility], [place] [address] [place], [place] [address]  Phosphorus     Status: None   Collection Time: 11/30/22  6:09 AM  Result Value Ref Range   Phosphorus 3.1 2.5 - 4.6 mg/dL    Comment: Performed at [place] [address], [place], [place] [id number]  Basic metabolic panel     Status: Abnormal   Collection Time: 11/30/22  6:09 AM  Result Value Ref Range   Sodium 140 135 - 145 mmol/L   Potassium 4.0 3.5 - 5.1 mmol/L   Chloride 106 98 - 111 mmol/L   CO2 21 (L) 22 - 32 mmol/L   Glucose, Bld 84 70 - 99 mg/dL    Comment: Glucose reference range applies only to samples taken after fasting for at least 8 hours.   BUN 6 6 - 20 mg/dL   Creatinine, Ser 1.11 0.61 - 1.24 mg/dL   Calcium 8.8 (L) 8.9 - 10.3 mg/dL   GFR, Estimated >60 >60 mL/min    Comment:  (NOTE) Calculated using the CKD-EPI Creatinine Equation (2021)    Anion gap 13 5 - 15  Comment: Performed at [facility], [place] [address], [place] [address]  CBC     Status: None   Collection Time: 11/30/22  6:09 AM  Result Value Ref Range   WBC 8.2 4.0 - 10.5 K/uL   RBC 5.07 4.22 - 5.81 MIL/uL   Hemoglobin 14.1 13.0 - 17.0 g/dL   HCT 43.6 39.0 - 52.0 %   MCV 86.0 80.0 - 100.0 fL   MCH 27.8 26.0 - 34.0 pg   MCHC 32.3 30.0 - 36.0 g/dL   RDW 13.3 11.5 - 15.5 %   Platelets 206 150 - 400 K/uL   nRBC 0.0 0.0 - 0.2 %    Comment: Performed at [facility], [place] [address] [place], [place] [address]  Glucose, capillary     Status: Abnormal   Collection Time: 11/30/22  7:38 AM  Result Value Ref Range   Glucose-Capillary 121 (H) 70 - 99 mg/dL    Comment: Glucose reference range applies only to samples taken after fasting for at least 8 hours.    Recent Results (from the past 240 hour(s))  MRSA Next Gen by PCR, Nasal     Status: None   Collection Time: 11/28/22  9:50 PM   Specimen: Nasal Mucosa; Nasal Swab  Result Value Ref Range Status   MRSA by PCR Next Gen NOT DETECTED NOT DETECTED Final    Comment: (NOTE) The GeneXpert MRSA Assay (FDA approved for NASAL specimens only), is one component of a comprehensive MRSA colonization surveillance program. It is not intended to diagnose MRSA infection nor to guide or monitor treatment for MRSA infections. Test performance is not FDA approved in patients less than 21 years old. Performed at [facility], [place] [address], [place], [place] [address]     Lipid Panel No results for input(s): "CHOL", "TRIG", "HDL", "CHOLHDL", "VLDL", "LDLCALC" in the last 72 hours.  Studies/Results: DG CHEST PORT 1 VIEW  Result Date: 11/29/2022 CLINICAL DATA:  Cough. EXAM: PORTABLE CHEST 1 VIEW COMPARISON:  04/17/2021. FINDINGS: Low lung volumes accentuate the pulmonary vasculature and cardiomediastinal silhouette. No focal airspace  opacity. No pleural effusion or pneumothorax. Feeding tube courses through the proximal stomach and beyond the field of view. IMPRESSION: No evidence of acute cardiopulmonary disease. Electronically Signed   By: Emmit Alexanders M.D.   On: 11/29/2022 15:33   DG Abd Portable 1V  Result Date: 11/29/2022 CLINICAL DATA:  Feeding tube placement. EXAM: PORTABLE ABDOMEN - 1 VIEW COMPARISON:  Abdominal x-ray dated July 14, 2020. FINDINGS: Feeding tube tip near the pylorus, possibly in the first portion of the duodenum. The bowel gas pattern is normal. No radio-opaque calculi or other significant radiographic abnormality are seen. Clear lung bases. IMPRESSION: 1. Feeding tube tip near the pylorus, possibly in the first portion of the duodenum. Electronically Signed   By: Titus Dubin M.D.   On: 11/29/2022 11:01   Overnight EEG with video  Result Date: 11/29/2022 Lora Havens, MD     11/30/2022  8:39 AM Patient Name: Dylan Moon MRN: [id number] Epilepsy Attending: Lora Havens Referring Physician/Provider: Julian Hy, DO Duration: 11/28/2022 [id number] to 11/29/2022 [id number] Patient history: 27yo M with ams. EEG to evaluate for seizure Level of alertness: Awake, asleep AEDs during EEG study: OXC, Phenobarb, VPA Technical aspects: This EEG study was done with scalp electrodes positioned according to the 10-20 International system of electrode placement. Electrical activity was reviewed with band pass filter of 1-'70Hz'$ , sensitivity of  7 uV/mm, display speed of 24m/sec with a '60Hz'$  notched filter applied as appropriate. EEG data were recorded continuously and digitally stored.  Video monitoring was available and reviewed as appropriate. Description: No clear posterior dominant rhythm was seen. Sleep was characterized by vertex waves, sleep spindles (12 to 14 Hz), maximal frontocentral region.  EEG showed continuous generalized 3 to 6 Hz theta-delta slowing admixed with an excessive amount of 13-15 Hz beta activity  distributed symmetrically and diffusely. Sharp waves were noted in left> right frontotemporal region. Hyperventilation and photic stimulation were not performed.   ABNORMALITY - Sharp waves, left> right frontotemporal region - Continuous slow, generalized - Excessive beta, generalized IMPRESSION: This study showed evidence of independent epileptogenicity arising from left> right frontotemporal region. Additionally there is moderate diffuse encephalopathy, nonspecific etiology. The excessive beta activity seen in the background is most likely due to the effect of benzodiazepine and is a benign EEG pattern. No seizures were seen throughout the recording. Priyanka OBarbra Sarks   Medications: Scheduled:  Chlorhexidine Gluconate Cloth  6 each Topical Daily   cloNIDine  0.1 mg Transdermal Weekly   enoxaparin (LOVENOX) injection  40 mg Subcutaneous Daily   Eslicarbazepine Acetate  1,600 mg Per Tube Daily   feeding supplement (PROSource TF20)  60 mL Per Tube Daily   haloperidol  5 mg Per Tube Q8H   Or   haloperidol lactate  10 mg Intravenous Q8H   LORazepam  2 mg Per Tube Q8H   Or   LORazepam  2 mg Intravenous Q8H   mouth rinse  15 mL Mouth Rinse 4 times per day   PHENObarbital  65 mg Intravenous Q4H   thiamine (VITAMIN B1) injection  100 mg Intravenous Daily   Continuous:  feeding supplement (OSMOLITE 1.5 CAL) 1,000 mL (11/30/22 [id number]   valproate sodium 1,000 mg (11/30/22 0947)     Assessment: Patient with longstanding history of seizures on dual AEDs (valproic acid and eslicarbazepine acetate) admitted for witnessed tonic-clinic seizure lasting approximately 1.5 minutes in the setting of recent history of poor sleep, work stressors, and inconsistent medication adherence (valproate level was notably at therapeutic range on admission, despite girlfriend endorsing recent poor compliance).  - Exam this morning reveals a sedated patient who briefly opens eyes to stimulation and moves around attempting to  get out of his bed. No jerking, twitching, posturing, eye deviation or other seizure like activity noted.    - Has had no clinically apparent seizures since he presented to the ED on Tuesday.  - EEGs:  -LTM 3/7-3/8: Sharp waves, left> right frontotemporal region; Continuous slow, generalized; Excessive beta, generalized. This study showed evidence of independent epileptogenicity arising from left> right frontotemporal region. Additionally there is moderate diffuse encephalopathy, nonspecific etiology. The excessive beta activity seen in the background is most likely due to the effect of benzodiazepine and is a benign EEG pattern. No seizures or epileptiform discharges were seen throughout the recording. - LTM EEG report for today AM (3/9): Continuous slow, generalized; Excessive beta, generalized. This study is suggestive of moderate diffuse encephalopathy, nonspecific etiology. The excessive beta activity seen in the background is most likely due to the effect of benzodiazepine and is a benign EEG pattern. No seizures or definite epileptiform discharges were seen throughout the recording.  - DDx:  - His breakthrough seizures prior to admission were most likely secondary to partial medication compliance (suspected noncompliance with eslicarbazepine, as his VPA level on admission was therapeutic.  - Also suspected is possible substance abuse  as a trigger for breakthrough seizures  - His 3 days of sleep deprivation also likely lowered the seizure threshold. Girlfriend states that his sleep deprivation was due to anxiety regarding possible sudden death during sleep from a seizure. Also possibly manifesting a manic phase contributing to sleep deprivation. - Psychiatric: Sleep deprivation may also have contributed to his acute onset of psychosis/agitation. Characteristics of his behavior in the ED and yesterday would be compatible with an agitated delirium.    Recommendations: - LTM EEG has been discontinued   - Continue IV valproic acid 1000 mg BID  - Has been switched back to his home eslicarbazepine from oxcarbazepine.  - Continue to monitor and manage agitation: Appreciate Psychiatry input.  - Phenobarbital 260 mg IV q4h - Ativan 2 mg IV/PO q8h supplemented by PRN doses at 5 mg IV q6h - Psychiatry is consulting and managing his antipsychotic regimen. Appreciate their recommendations towards titrating the patient towards a stable, non-agitated and preferably non-sedated state.      LOS: 3 days   '@Electronically'$  signed: Dr. Kerney Elbe 11/30/2022  10:57 AM

## 2022-12-01 DIAGNOSIS — R451 Restlessness and agitation: Secondary | ICD-10-CM | POA: Diagnosis not present

## 2022-12-01 DIAGNOSIS — G40909 Epilepsy, unspecified, not intractable, without status epilepticus: Secondary | ICD-10-CM | POA: Diagnosis not present

## 2022-12-01 DIAGNOSIS — R569 Unspecified convulsions: Secondary | ICD-10-CM | POA: Diagnosis not present

## 2022-12-01 LAB — GLUCOSE, CAPILLARY
Glucose-Capillary: 120 mg/dL — ABNORMAL HIGH (ref 70–99)
Glucose-Capillary: 101 mg/dL — ABNORMAL HIGH (ref 70–99)
Glucose-Capillary: 98 mg/dL (ref 70–99)
Glucose-Capillary: 126 mg/dL — ABNORMAL HIGH (ref 70–99)
Glucose-Capillary: 125 mg/dL — ABNORMAL HIGH (ref 70–99)
Glucose-Capillary: 123 mg/dL — ABNORMAL HIGH (ref 70–99)

## 2022-12-01 LAB — CBC
HCT: 40.1 % (ref 39.0–52.0)
Hemoglobin: 13.4 g/dL (ref 13.0–17.0)
MCH: 28.4 pg (ref 26.0–34.0)
MCHC: 33.4 g/dL (ref 30.0–36.0)
MCV: 85 fL (ref 80.0–100.0)
Platelets: 201 10*3/uL (ref 150–400)
RBC: 4.72 MIL/uL (ref 4.22–5.81)
RDW: 13.3 % (ref 11.5–15.5)
WBC: 7.2 10*3/uL (ref 4.0–10.5)
nRBC: 0 % (ref 0.0–0.2)

## 2022-12-01 LAB — AMMONIA: Ammonia: 61 umol/L — ABNORMAL HIGH (ref 9–35)

## 2022-12-01 LAB — COMPREHENSIVE METABOLIC PANEL
ALT: 15 U/L (ref 0–44)
AST: 26 U/L (ref 15–41)
Albumin: 3.3 g/dL — ABNORMAL LOW (ref 3.5–5.0)
Alkaline Phosphatase: 34 U/L — ABNORMAL LOW (ref 38–126)
Anion gap: 12 (ref 5–15)
BUN: 7 mg/dL (ref 6–20)
CO2: 24 mmol/L (ref 22–32)
Calcium: 8.8 mg/dL — ABNORMAL LOW (ref 8.9–10.3)
Chloride: 104 mmol/L (ref 98–111)
Creatinine, Ser: 0.94 mg/dL (ref 0.61–1.24)
GFR, Estimated: >60 mL/min (ref >60)
Glucose, Bld: 129 mg/dL — ABNORMAL HIGH (ref 70–99)
Potassium: 3.6 mmol/L (ref 3.5–5.1)
Sodium: 140 mmol/L (ref 135–145)
Total Bilirubin: 0.6 mg/dL (ref 0.3–1.2)
Total Protein: 6 g/dL — ABNORMAL LOW (ref 6.5–8.1)

## 2022-12-01 LAB — SEDIMENTATION RATE: Sed Rate: 8 mm/hr (ref 0–16)

## 2022-12-01 LAB — C-REACTIVE PROTEIN: CRP: 2.4 mg/dL — ABNORMAL HIGH (ref <1.0)

## 2022-12-01 LAB — CK TOTAL AND CKMB (NOT AT ARMC)
CK, MB: 1.3 ng/mL (ref 0.5–5.0)
Relative Index: 0.1 (ref 0.0–2.5)
Total CK: 1140 U/L — ABNORMAL HIGH (ref 49–397)

## 2022-12-01 LAB — MAGNESIUM: Magnesium: 2.2 mg/dL (ref 1.7–2.4)

## 2022-12-01 LAB — LACTIC ACID, PLASMA: Lactic Acid, Venous: 1.2 mmol/L (ref 0.5–1.9)

## 2022-12-01 LAB — PHOSPHORUS: Phosphorus: 4.6 mg/dL (ref 2.5–4.6)

## 2022-12-01 MED ORDER — POTASSIUM CHLORIDE 20 MEQ PO PACK
40.0000 meq | PACK | Freq: Once | ORAL | Status: AC
  Administered 2022-12-01: 40 meq
  Filled 2022-12-01: qty 2

## 2022-12-01 MED ORDER — THIAMINE MONONITRATE 100 MG PO TABS
100.0000 mg | ORAL_TABLET | Freq: Every day | ORAL | Status: DC
  Administered 2022-12-02 – 2022-12-08 (×7): 100 mg
  Filled 2022-12-01 (×7): qty 1

## 2022-12-01 MED ORDER — PHENOBARBITAL SODIUM 65 MG/ML IJ SOLN
65.0000 mg | Freq: Two times a day (BID) | INTRAMUSCULAR | Status: DC
  Administered 2022-12-01 – 2022-12-02 (×2): 65 mg via INTRAVENOUS
  Filled 2022-12-01 (×2): qty 1

## 2022-12-01 MED ORDER — RISPERIDONE 0.5 MG PO TABS
1.0000 mg | ORAL_TABLET | Freq: Two times a day (BID) | ORAL | Status: DC
  Administered 2022-12-01 – 2022-12-02 (×3): 1 mg
  Filled 2022-12-01 (×3): qty 2

## 2022-12-01 MED ORDER — LACTULOSE 10 GM/15ML PO SOLN
30.0000 g | Freq: Three times a day (TID) | ORAL | Status: DC
  Administered 2022-12-01 – 2022-12-04 (×8): 30 g
  Filled 2022-12-01 (×8): qty 45

## 2022-12-01 MED ORDER — DOCUSATE SODIUM 50 MG/5ML PO LIQD
100.0000 mg | Freq: Two times a day (BID) | ORAL | Status: DC
  Administered 2022-12-01 – 2022-12-03 (×5): 100 mg
  Filled 2022-12-01 (×5): qty 10

## 2022-12-01 MED ORDER — VALPROATE SODIUM 100 MG/ML IV SOLN
500.0000 mg | Freq: Three times a day (TID) | INTRAVENOUS | Status: DC
  Administered 2022-12-01 – 2022-12-02 (×2): 500 mg via INTRAVENOUS
  Filled 2022-12-01 (×5): qty 5

## 2022-12-01 MED ORDER — CLONIDINE HCL 0.1 MG PO TABS
0.1000 mg | ORAL_TABLET | Freq: Three times a day (TID) | ORAL | Status: DC
  Administered 2022-12-01 – 2022-12-05 (×12): 0.1 mg
  Filled 2022-12-01 (×12): qty 1

## 2022-12-01 NOTE — Progress Notes (Signed)
NAME:  Dylan Moon, MRN:  [id number], DOB:  June 15, 1996, LOS: 4 ADMISSION DATE:  11/26/2022, CONSULTATION DATE:  11/27/2022 REFERRING MD:  Dr. Gwendlyn Deutscher , CHIEF COMPLAINT: Severe agitation   History of Present Illness:  Dylan Moon is a 27 year old male with past medical history significant for seizures, ADHD, bipolar, schizoaffective disease, scoliosis, and medical noncompliance who presented to the emergency department 3/5 for seizures, felt secondary to medication noncompliance.  Patient was admitted per family medicine teaching service with neurology consultation.  Mid morning of 3/6 patient progressively became more violent and agitated towards staff resulting in security being called, application of violent restraints, and eventually addition of chemical restraints as well. Given need for close monitoring of mentation and airway after chemical restraints PCCM was consulted    Pertinent  Medical History  Seizures, ADHD, bipolar, schizoaffective disease, scoliosis, and medical noncompliance  Significant Hospital Events: Including procedures, antibiotic start and stop dates in addition to other pertinent events   3/5 presented to ED after 1.5-minute tonic-clonic seizure per family 3/6 severely agitated and combative towards staff resulting in both violent and chemical restraints  3/8 continued severe agitation with escalating behavior and requiring multiple sedating medications until last night, calm this morning   Interim History / Subjective:  This AM remains in upper restraints, lowers off. Received 2 versed this AM   Objective   Blood pressure (!) 143/84, pulse 78, temperature 99.3 F (37.4 C), temperature source Axillary, resp. rate (!) 23, weight 84.4 kg, SpO2 100 %.        Intake/Output Summary (Last 24 hours) at 12/01/2022 0831 Last data filed at 12/01/2022 0600 Gross per 24 hour  Intake 1671.84 ml  Output 1700 ml  Net -28.16 ml   Filed Weights   11/29/22 0500 11/30/22  0459 12/01/22 0500  Weight: 85.2 kg 86.5 kg 84.4 kg   General:  Young adult male, lying in bed, no distress  HEENT: Dry MM  Neuro: awakens with verbal stimulation, does not follow commands, moans, spontaneous movement  CV: s1s2 rrr, no m/r/g PULM:  clear bilaterally,no use of accessory muscles  GI: soft, distended, active bowel sounds  Extremities: warm/dry, no edema  Skin: no rashes or lesions  Resolved Hospital Problem list   AKI  Assessment & Plan:  Epilepsy with longstanding seizure history, vagal nerve stimulator. Plan -Neurology Following  -con't PTA eslicarbazepine -seizure precautions -ativan PRN for seizures -EEG D/C   History of medication noncompliance Acute psychosis with aggressive behavior -his previous outpatient diagnoses of ADHD and anxiety do not fully explain his presentation, which appears to be schizoaffective disorder, bipolar disorder, or post-ictal psychosis -severe aggression and agitation has been very difficult to control over the last two days requiring benzos, anti-psychotics, benadryl and ketamine Plan - Appreciate Psychiatry's management - due to very aggressive and self-harming behaviors, he still requires restraints - Continue IV valproic acid 1000 mg BID  - Continue oxcarbazepine - Continue Clonidine 0.1 patch >>> Change to 0.1 mg TID  - Phenobarbital '65mg'$  IV q4h >> decrease to 65 mg q12h  - Ativan 2 mg IV/PO q8h supplemented by PRN doses at 5 mg IV q6h - Haldol 5 mg PO q8h supplemented by PRN IV dosing - PRN only: - Midazolam PRN dosing 2-4 mg IV/IM q1h -Qtc monitoring -safety sitter  1:1  AKI  resolved with hydration Plan -Trend BMP   Best Practice (right click and "Reselect all SmartList Selections" daily)   Diet/type: NPO until mental status improves DVT prophylaxis:  LMWH GI prophylaxis: N/A Lines: N/A Foley:  External cath in place  Code Status:  full code Last date of multidisciplinary goals of care discussion: Family  updated at bedside 3/9  Labs   CBC: Recent Labs  Lab 11/26/22 1142 11/27/22 1006 11/28/22 0338 11/30/22 0609 12/01/22 0103  WBC 11.6* 9.6 10.0 8.2 7.2  HGB 15.5 14.2 14.2 14.1 13.4  HCT 47.0 43.6 42.0 43.6 40.1  MCV 84.8 85.7 84.0 86.0 85.0  PLT 250 229 206 [id number]    Basic Metabolic Panel: Recent Labs  Lab 11/27/22 1006 11/28/22 0338 11/29/22 0228 11/29/22 1809 11/30/22 0609 12/01/22 0103  NA 139 141 141  --  140 140  K 3.4* 3.6 4.2  --  4.0 3.6  CL 98 106 104  --  106 104  CO2 17* 24 24  --  21* 24  GLUCOSE 98 87 79  --  84 129*  BUN 9 6 <5*  --  6 7  CREATININE 1.48* 1.08 1.14  --  1.11 0.94  CALCIUM 9.4 9.0 9.2  --  8.8* 8.8*  MG 1.8 1.9 2.0  --  1.9 2.2  PHOS  --  3.6 5.1* 3.4 3.1 4.6   GFR: Estimated Creatinine Clearance: 125.7 mL/min (by C-G formula based on SCr of 0.94 mg/dL). Recent Labs  Lab 11/27/22 1006 11/28/22 0338 11/30/22 0609 12/01/22 0103  WBC 9.6 10.0 8.2 7.2  LATICACIDVEN  --   --   --  1.2    Liver Function Tests: Recent Labs  Lab 11/26/22 1148 11/27/22 1006 12/01/22 0103  AST 41 54* 26  ALT '22 22 15  '$ ALKPHOS 40 39 34*  BILITOT 0.6 1.2 0.6  PROT 7.3 7.2 6.0*  ALBUMIN 4.5 4.4 3.3*   Recent Labs  Lab 11/30/22 1510 11/30/22 1952 11/30/22 2311 12/01/22 0336 12/01/22 0753  GLUCAP 137* 109* 140* 120* 101*   Critical care time:  35 minutes   CRITICAL CARE Performed by: Omar Person   Total critical care time: 35 minutes  Critical care time was exclusive of separately billable procedures and treating other patients.  Critical care was necessary to treat or prevent imminent or life-threatening deterioration.  Critical care was time spent personally by me on the following activities: development of treatment plan with patient and/or surrogate as well as nursing, discussions with consultants, evaluation of patient's response to treatment, examination of patient, obtaining history from patient or surrogate, ordering  and performing treatments and interventions, ordering and review of laboratory studies, ordering and review of radiographic studies, pulse oximetry and re-evaluation of patient's condition.  Hayden Pedro, AGACNP-BC [facility]  PCCM Pgr: [phone]

## 2022-12-01 NOTE — Progress Notes (Signed)
[facility] Progress Note Patient Name: MAKAIH TATGE DOB: [birth year] MRN: [id number]   Date of Service  12/01/2022  HPI/Events of Note  Ammonia level = 61 earlier today.  eICU Interventions  Plan: Lactulose 30 gm per tube now and TID. Ammonia level in AM X 3 days.      Intervention Category Major Interventions: Other:  Joden Bonsall Cornelia Copa 12/01/2022, 9:22 PM

## 2022-12-01 NOTE — Progress Notes (Signed)
In regards to pt's currently ordered MRI. Per RN, pt in no state for MRI at this time. Per RN, follow up 3/11 for status change.

## 2022-12-01 NOTE — Consult Note (Signed)
[facility] Face-to-Face Psychiatric Evaluation   Service Date: December 01, 2022 LOS:  LOS: 4 days  Reason for Consult: "suicidal attempt" Consult by: Donney Dice  Assessment  Dylan Moon is a 27 y.o. male with hx of PTSD, ADHD, ?schizoaffective disorder, depression, anxiety, and seizures, admitted medically for 11/26/2022 11:30 AM for seizures.   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 He presented to the hospital for seizures; psychiatry is involved for symptomatic management of agitation which has been quite severe. Overall theme of difficulty balancing agitation and sedation. Generally has improved over past 2-3 days since transfer to ICU and initiation of phenobarbital taper. Medications are limited by [place] (cannot administer previously effective invega). He is currently on risperidone 1 BID (weighing lowering of seizure threshold against ongoing agitation). Mom has given surrogate consent for this with the understanding it will be switched to [facility] when able.   Patien was sedated at time of my eval, but rousable. Difficulty following simple commands and fell back asleep quickly. Seems to be less agitated per sitter and nursing than previous - discussed reorientation with sitter to hopefully help him get out of restraints. No med changes today.    He is currently under IVC and would recommend continuing it along with 1:1 sitter. At this point given confusion and agitation at least partially due to postictal state, cannot determine if inpt psych will benefit pt. Will reassess this daily. Differential remains the same as yesterday (schizoaffective disorder vs bipolar disorder vs postictal psychosis vs substance induced psychosis).   Plan  ## Safety and Observation  Level:  - Based on my clinical evaluation, I estimate the patient to be at high risk of self harm in the current setting. Recommend 1:1 sitter and restraints as needed, following restraint policies and de-escalating when and where possible   #Severe Agitation #Seizures #Bipolar disorder #hx of PTSD #hx of ADHD Bipolar Disorder vs Postictal psychosis vs schizoaffective disorder vs substance induced psychosis -Geodon 10 mg IV q6h prn agitation  -Ativan 2 mg IV or PO q8h scheduled - consider decrease tomorrow  - risperidone 1 BID -Continue Clonidine patch 0.1 mg weekly -Defer depcaon and phenobarbital dosing to neuro and primary   - put in depakote level, was supratherapeutic, neuro adjusted down.   - do not want to start second antipsychotic at this time.   -1:1 sitter Pertinent labs: ECG 11/27/22 NSR Qtc 437, mild increase in Cr 1.2>1.48, CBC wnl  ## Disposition:  -- pending -- reach out to [facility] for urgent appt prior to dc.   Thank you for this consult request. Recommendations have been communicated to the primary team.  We will continue to follow at this time.   [place] A Teasha Murrillo   Relevant History  Relevant Aspects of Hospital Course:  Admitted on 11/26/2022 for seizures 3/6 rapid called due to severe agitation. Admitted to ICU and started on phenobarbital.  Patient Report:  Pt sedated when I examined - woke briefly to voice. Difficulty following simple commands (did "blink once, blink twice" with some success, couldn't get him to give thumbs up). Was out of leg restraints and tolerated physical exam, then fell back asleep. Did not further wake up as felt it would be harmful to pt (tends to become agitated when woken up disoriented, perpetuating delirium-->agitation-->PRN-->sedation cycle).  Collateral information:  None today 3/10  Spoke with girlfriend in room; answered questions.   Spoke to mother who asked several times for invega shot, explained this is not  possible (need to prove oral tolerability/efficacy) but thanked her for advocating.   Psychiatric History:  Never been psychiatrically hospitalized. He did establish care with Dr. Hassell Done at [facility] who diagnosed patient with Cannabis use disorder, GAD w/ panic attacks, and PTSD and he was started on mirtazapine. Sleep cycling not disclosed at that appt.    Social History:  Tobacco use: 3 black and mild per day Alcohol use: denies Drug use: Vape THC and smokes 3-4 joints of marijuana per day  Family History:  The patient's family history includes Cancer in an other family member; Diabetes in his father and mother; Hypertension in his mother; Seizures in his maternal grandfather.  Medical History: Past Medical History:  Diagnosis Date   ADHD    Bipolar 1 disorder ([place])    Schizoaffective disorder ([place])    Scoliosis    Seizures ([place])    most recent 12/02/17    Surgical History: Past Surgical History:  Procedure Laterality Date   NO PAST SURGERIES      Medications:   Current Facility-Administered Medications:    acetaminophen (TYLENOL) tablet 650 mg, 650 mg, Oral, Q4H PRN, Noemi Chapel P, DO   Chlorhexidine Gluconate Cloth 2 % PADS 6 each, 6 each, Topical, Daily, Olalere, Adewale A, MD, 6 each at 12/01/22 0930   cloNIDine (CATAPRES) tablet 0.1 mg, 0.1 mg, Per Tube, TID, Omar Person, NP, 0.1 mg at 12/01/22 1249   diphenhydrAMINE (BENADRYL) injection 50 mg, 50 mg, Intravenous, Q6H PRN, Bowser, Grace E, NP, 50 mg at 11/30/22 1935   docusate (COLACE) 50 MG/5ML liquid 100 mg, 100 mg, Per Tube, BID, Hayden Pedro M, NP, 100 mg at 12/01/22 0928   enoxaparin (LOVENOX) injection 40 mg, 40 mg, Subcutaneous, Daily, Noemi Chapel P, DO, 40 mg at [id number]   Eslicarbazepine Acetate TABS 1,600 mg, 1,600 mg, Per Tube, Daily, Olalere, Adewale A, MD, 1,600 mg at 12/01/22 0953   feeding supplement (OSMOLITE 1.5 CAL) liquid 1,000 mL, 1,000 mL, Per Tube, Continuous,  Olalere, Adewale A, MD, Last Rate: 65 mL/hr at 12/01/22 1600, Infusion Verify at 12/01/22 1600   feeding supplement (PROSource TF20) liquid 60 mL, 60 mL, Per Tube, Daily, Olalere, Adewale A, MD, 60 mL at 12/01/22 0928   LORazepam (ATIVAN) injection 4 mg, 4 mg, Intravenous, PRN, Eppie Gibson, MD   LORazepam (ATIVAN) tablet 2 mg, 2 mg, Per Tube, Q8H, 2 mg at 12/01/22 1425 **OR** [DISCONTINUED] LORazepam (ATIVAN) injection 2 mg, 2 mg, Intravenous, Q8H, Olalere, Adewale A, MD, 2 mg at 12/01/22 0614   midazolam (VERSED) injection 2-4 mg, 2-4 mg, Intravenous, Q1H PRN, 2 mg at 12/01/22 1647 **OR** midazolam (VERSED) injection 2-4 mg, 2-4 mg, Intramuscular, Q1H PRN, Noemi Chapel P, DO, 2 mg at 12/01/22 0813   Oral care mouth rinse, 15 mL, Mouth Rinse, 4 times per day, Olalere, Adewale A, MD, 15 mL at 12/01/22 1215   Oral care mouth rinse, 15 mL, Mouth Rinse, PRN, Olalere, Adewale A, MD   PHENObarbital (LUMINAL) injection 65 mg, 65 mg, Intravenous, BID, Eubanks, Katalina M, NP   polyethylene glycol (MIRALAX / GLYCOLAX) packet 17 g, 17 g, Oral, Daily PRN, Noemi Chapel P, DO, 17 g at 12/01/22 1249   risperiDONE (RISPERDAL) tablet 1 mg, 1 mg, Per Tube, BID, Brand Males, MD, 1 mg  at 12/01/22 0928   [START ON 12/02/2022] thiamine (VITAMIN B1) tablet 100 mg, 100 mg, Per Tube, Daily, Omar Person, NP   valproate (DEPACON) 500 mg in dextrose 5 % 50 mL IVPB, 500 mg, Intravenous, Q8H, Kerney Elbe, MD   white petrolatum (VASELINE) gel, , Topical, PRN, Julian Hy, DO  Allergies: Allergies  Allergen Reactions   Keppra [Levetiracetam] Other (See Comments)    Irritability    Tramadol Other (See Comments)    Contraindicated with current medications (??)   Vimpat [Lacosamide] Other (See Comments)    Causes anger       Objective  Vital signs:  Temp:  [97.8 F (36.6 C)-100.1 F (37.8 C)] 99.7 F (37.6 C) (03/10 1600) Pulse Rate:  [73-95] 91 (03/10 1500) Resp:  [20-28] 25 (03/10  1500) BP: (113-163)/(62-132) 144/83 (03/10 1500) SpO2:  [99 %-100 %] 100 % (03/10 1500) Weight:  [84.4 kg] 84.4 kg (03/10 0500)  Psychiatric Specialty Exam:  Presentation  General Appearance: Disheveled Eye Contact: minimal  Speech: stated name, slurred Speech Volume: n/a  Mood and Affect  Mood: did not assess Affect: somnolent  Thought Process  Thought Processes: unable to assess Descriptions of Associations: unable to assess Orientation: not assessed Thought Content:unable to assess Hallucinations: unable to assess Ideas of Reference: unable to assess Suicidal Thoughts: unable to assess Homicidal Thoughts: unable to assess  Sensorium  Memory:unable to assess Judgment: unable to assess Insight: unable to assess  Executive Functions  Concentration: unable to assess Attention Span:unable to assess Recall:unable to assess Fund of Knowledge: unable to assess Language: unable to assess  Psychomotor Activity  Psychomotor Activity: decreased   Sleep  Sleep: oversedated  Physical exam: No rigidity or coghweeling in b/l l/e.  ROS: unable to assess, mental status change

## 2022-12-01 NOTE — Progress Notes (Signed)
Subjective: Continues to be alternately agitated with wearing off of medications, then somnolent after administration of sedating and antipsychotic medications  Objective: Current vital signs: BP (!) 143/84   Pulse 78   Temp 99.3 F (37.4 C) (Axillary)   Resp (!) 23   Wt 84.4 kg   SpO2 100%   BMI 25.95 kg/m  Vital signs in last 24 hours: Temp:  [97.7 F (36.5 C)-100.1 F (37.8 C)] 99.3 F (37.4 C) (03/10 0756) Pulse Rate:  [72-88] 78 (03/10 0500) Resp:  [20-28] 23 (03/10 0500) BP: (103-150)/(57-132) 143/84 (03/10 0500) SpO2:  [100 %] 100 % (03/10 0500) Weight:  [84.4 kg] 84.4 kg (03/10 0500)  Intake/Output from previous day: 03/09 0701 - 03/10 0700 In: 1811.8 [I.V.:146.7; NG/GT:1495; IV Piggyback:170.1] Out: 1700 [Urine:1700] Intake/Output this shift: No intake/output data recorded. Nutritional status:  Diet Order             Diet clear liquid Room service appropriate? Yes; Fluid consistency: Thin  Diet effective now                  HEENT: [place]/AT Lungs: Respirations unlabored Ext: No edema. In four point restraints.    Neurologic Exam: Ment: Sedated with semicoherent responses to verbal stimuli as well as in response to exam. Eyes are open. Will state his name with dysarthric speech. Did not follow any commands or answer any other questions. Agitated and trying to get out of bed/restraints.   CN: Eyes are conjugate. Face symmetric.  Motor: Moving around in bed with restraints on, attempting to get out. All 4 limbs are with grossly equal movement, but patient is not awake enough to follow commands for formal motor testing.   Sensory: Responds to stimuli x 4 Reflexes: Normoactive.  Cerebellar/Gait: Unable to assess  Lab Results: Results for orders placed or performed during the hospital encounter of 11/26/22 (from the past 48 hour(s))  Glucose, capillary     Status: Abnormal   Collection Time: 11/29/22  3:16 PM  Result Value Ref Range   Glucose-Capillary 125  (H) 70 - 99 mg/dL    Comment: Glucose reference range applies only to samples taken after fasting for at least 8 hours.  Phosphorus     Status: None   Collection Time: 11/29/22  6:09 PM  Result Value Ref Range   Phosphorus 3.4 2.5 - 4.6 mg/dL    Comment: Performed at [facility], [place] [address] [place], [place] [address]  Glucose, capillary     Status: Abnormal   Collection Time: 11/29/22  7:14 PM  Result Value Ref Range   Glucose-Capillary 108 (H) 70 - 99 mg/dL    Comment: Glucose reference range applies only to samples taken after fasting for at least 8 hours.  Glucose, capillary     Status: Abnormal   Collection Time: 11/29/22 11:38 PM  Result Value Ref Range   Glucose-Capillary 127 (H) 70 - 99 mg/dL    Comment: Glucose reference range applies only to samples taken after fasting for at least 8 hours.   Comment 1 Notify RN    Comment 2 Document in Chart   Glucose, capillary     Status: None   Collection Time: 11/30/22  3:05 AM  Result Value Ref Range   Glucose-Capillary 87 70 - 99 mg/dL    Comment: Glucose reference range applies only to samples taken after fasting for at least 8 hours.   Comment 1 Notify RN    Comment 2 Document in Chart  Magnesium     Status: None   Collection Time: 11/30/22  6:09 AM  Result Value Ref Range   Magnesium 1.9 1.7 - 2.4 mg/dL    Comment: Performed at [facility], [place] [address] [place], [place] [address]  Phosphorus     Status: None   Collection Time: 11/30/22  6:09 AM  Result Value Ref Range   Phosphorus 3.1 2.5 - 4.6 mg/dL    Comment: Performed at [facility] [address], [place], [place] [id number]  Basic metabolic panel     Status: Abnormal   Collection Time: 11/30/22  6:09 AM  Result Value Ref Range   Sodium 140 135 - 145 mmol/L   Potassium 4.0 3.5 - 5.1 mmol/L   Chloride 106 98 - 111 mmol/L   CO2 21 (L) 22 - 32 mmol/L   Glucose, Bld 84 70 - 99 mg/dL    Comment: Glucose reference range applies only to  samples taken after fasting for at least 8 hours.   BUN 6 6 - 20 mg/dL   Creatinine, Ser 1.11 0.61 - 1.24 mg/dL   Calcium 8.8 (L) 8.9 - 10.3 mg/dL   GFR, Estimated >60 >60 mL/min    Comment: (NOTE) Calculated using the CKD-EPI Creatinine Equation (2021)    Anion gap 13 5 - 15    Comment: Performed at [facility] [address] [place] [address]  CBC     Status: None   Collection Time: 11/30/22  6:09 AM  Result Value Ref Range   WBC 8.2 4.0 - 10.5 K/uL   RBC 5.07 4.22 - 5.81 MIL/uL   Hemoglobin 14.1 13.0 - 17.0 g/dL   HCT 43.6 39.0 - 52.0 %   MCV 86.0 80.0 - 100.0 fL   MCH 27.8 26.0 - 34.0 pg   MCHC 32.3 30.0 - 36.0 g/dL   RDW 13.3 11.5 - 15.5 %   Platelets 206 150 - 400 K/uL   nRBC 0.0 0.0 - 0.2 %    Comment: Performed at [facility], [place] [address] [place], [place] [address]  Glucose, capillary     Status: Abnormal   Collection Time: 11/30/22  7:38 AM  Result Value Ref Range   Glucose-Capillary 121 (H) 70 - 99 mg/dL    Comment: Glucose reference range applies only to samples taken after fasting for at least 8 hours.  Glucose, capillary     Status: Abnormal   Collection Time: 11/30/22 12:02 PM  Result Value Ref Range   Glucose-Capillary 122 (H) 70 - 99 mg/dL    Comment: Glucose reference range applies only to samples taken after fasting for at least 8 hours.  Glucose, capillary     Status: Abnormal   Collection Time: 11/30/22  3:10 PM  Result Value Ref Range   Glucose-Capillary 137 (H) 70 - 99 mg/dL    Comment: Glucose reference range applies only to samples taken after fasting for at least 8 hours.  Valproic acid level     Status: Abnormal   Collection Time: 11/30/22  5:49 PM  Result Value Ref Range   Valproic Acid Lvl 107 (H) 50.0 - 100.0 ug/mL    Comment: Performed at [facility] [address], [place], [place] [address]  Glucose, capillary     Status: Abnormal   Collection Time: 11/30/22  7:52 PM  Result Value Ref Range    Glucose-Capillary 109 (H) 70 - 99 mg/dL  Comment: Glucose reference range applies only to samples taken after fasting for at least 8 hours.  Glucose, capillary     Status: Abnormal   Collection Time: 11/30/22 11:11 PM  Result Value Ref Range   Glucose-Capillary 140 (H) 70 - 99 mg/dL    Comment: Glucose reference range applies only to samples taken after fasting for at least 8 hours.  CBC     Status: None   Collection Time: 12/01/22  1:03 AM  Result Value Ref Range   WBC 7.2 4.0 - 10.5 K/uL   RBC 4.72 4.22 - 5.81 MIL/uL   Hemoglobin 13.4 13.0 - 17.0 g/dL   HCT 40.1 39.0 - 52.0 %   MCV 85.0 80.0 - 100.0 fL   MCH 28.4 26.0 - 34.0 pg   MCHC 33.4 30.0 - 36.0 g/dL   RDW 13.3 11.5 - 15.5 %   Platelets 201 150 - 400 K/uL   nRBC 0.0 0.0 - 0.2 %    Comment: Performed at [facility], [place] [address] [place], [place] [address]  Lactic acid, plasma     Status: None   Collection Time: 12/01/22  1:03 AM  Result Value Ref Range   Lactic Acid, Venous 1.2 0.5 - 1.9 mmol/L    Comment: Performed at [facility] [address], [place], [place] [address]  CK total and CKMB (cardiac)not at [facility]     Status: Abnormal   Collection Time: 12/01/22  1:03 AM  Result Value Ref Range   Total CK 1,140 (H) 49 - 397 U/L   CK, MB 1.3 0.5 - 5.0 ng/mL   Relative Index 0.1 0.0 - 2.5    Comment: Performed at [facility] [address], [place], [place] [address]  Comprehensive metabolic panel     Status: Abnormal   Collection Time: 12/01/22  1:03 AM  Result Value Ref Range   Sodium 140 135 - 145 mmol/L   Potassium 3.6 3.5 - 5.1 mmol/L   Chloride 104 98 - 111 mmol/L   CO2 24 22 - 32 mmol/L   Glucose, Bld 129 (H) 70 - 99 mg/dL    Comment: Glucose reference range applies only to samples taken after fasting for at least 8 hours.   BUN 7 6 - 20 mg/dL   Creatinine, Ser 0.94 0.61 - 1.24 mg/dL   Calcium 8.8 (L) 8.9 - 10.3 mg/dL   Total Protein 6.0 (L) 6.5 - 8.1 g/dL   Albumin 3.3 (L) 3.5 - 5.0  g/dL   AST 26 15 - 41 U/L   ALT 15 0 - 44 U/L   Alkaline Phosphatase 34 (L) 38 - 126 U/L   Total Bilirubin 0.6 0.3 - 1.2 mg/dL   GFR, Estimated >60 >60 mL/min    Comment: (NOTE) Calculated using the CKD-EPI Creatinine Equation (2021)    Anion gap 12 5 - 15    Comment: Performed at [facility], [place] [address], [place], [place] [address]  Magnesium     Status: None   Collection Time: 12/01/22  1:03 AM  Result Value Ref Range   Magnesium 2.2 1.7 - 2.4 mg/dL    Comment: Performed at [facility] [address], [place], [place] [address]  Phosphorus     Status: None   Collection Time: 12/01/22  1:03 AM  Result Value Ref Range   Phosphorus 4.6 2.5 - 4.6 mg/dL    Comment: Performed at [facility], [address] [place], [place] [address]  Glucose, capillary     Status: Abnormal   Collection Time: 12/01/22  3:36 AM  Result Value Ref Range   Glucose-Capillary 120 (H) 70 - 99 mg/dL    Comment: Glucose reference range applies only to samples taken after fasting for at least 8 hours.  Glucose, capillary     Status: Abnormal   Collection Time: 12/01/22  7:53 AM  Result Value Ref Range   Glucose-Capillary 101 (H) 70 - 99 mg/dL    Comment: Glucose reference range applies only to samples taken after fasting for at least 8 hours.    Recent Results (from the past 240 hour(s))  MRSA Next Gen by PCR, Nasal     Status: None   Collection Time: 11/28/22  9:50 PM   Specimen: Nasal Mucosa; Nasal Swab  Result Value Ref Range Status   MRSA by PCR Next Gen NOT DETECTED NOT DETECTED Final    Comment: (NOTE) The GeneXpert MRSA Assay (FDA approved for NASAL specimens only), is one component of a comprehensive MRSA colonization surveillance program. It is not intended to diagnose MRSA infection nor to guide or monitor treatment for MRSA infections. Test performance is not FDA approved in patients less than 8 years old. Performed at [facility], [place] [address]  [place], [place] [address]     Lipid Panel No results for input(s): "CHOL", "TRIG", "HDL", "CHOLHDL", "VLDL", "LDLCALC" in the last 72 hours.  Studies/Results: DG CHEST PORT 1 VIEW  Result Date: 11/29/2022 CLINICAL DATA:  Cough. EXAM: PORTABLE CHEST 1 VIEW COMPARISON:  04/17/2021. FINDINGS: Low lung volumes accentuate the pulmonary vasculature and cardiomediastinal silhouette. No focal airspace opacity. No pleural effusion or pneumothorax. Feeding tube courses through the proximal stomach and beyond the field of view. IMPRESSION: No evidence of acute cardiopulmonary disease. Electronically Signed   By: Emmit Alexanders M.D.   On: 11/29/2022 15:33    Medications: Scheduled:  Chlorhexidine Gluconate Cloth  6 each Topical Daily   cloNIDine  0.1 mg Per Tube TID   docusate  100 mg Per Tube BID   enoxaparin (LOVENOX) injection  40 mg Subcutaneous Daily   Eslicarbazepine Acetate  1,600 mg Per Tube Daily   feeding supplement (PROSource TF20)  60 mL Per Tube Daily   LORazepam  2 mg Per Tube Q8H   mouth rinse  15 mL Mouth Rinse 4 times per day   PHENObarbital  65 mg Intravenous BID   risperiDONE  1 mg Per Tube BID   [START ON 12/02/2022] thiamine  100 mg Per Tube Daily   Continuous:  feeding supplement (OSMOLITE 1.5 CAL) 65 mL/hr at 12/01/22 0600   valproate sodium Stopped (11/30/22 2307)    Assessment: 27 year old male with 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 Has been admitted previously for similar clinical picture. 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  - Exam this morning reveals an agitated and  partially sedated patient who is awake, with dysarthric speech, not following motor commands, who moves around attempting to get out of his bed. No jerking, twitching, posturing, eye deviation or other seizure like activity noted. No neck stiffness or diaphoresis.  -  Has had not had any clinically apparent seizures since he presented to the ED on Tuesday.  - VPA level yesterday was supratherapeutic at 107 - Prior MRI brain w/wo contrast obtained 2.5 years ago (07/12/20): Positive for a small left-sided subependymal gray matter heterotopia, stable since 2018. No other migrational abnormality identified. No evidence of mesial temporal sclerosis. - EEGs:  -LTM 3/7-3/8: Sharp waves, left> right frontotemporal region; Continuous slow, generalized; Excessive beta, generalized. This study showed evidence of independent epileptogenicity arising from left> right frontotemporal region. Additionally there is moderate diffuse encephalopathy, nonspecific etiology. The excessive beta activity seen in the background is most likely due to the effect of benzodiazepine and is a benign EEG pattern. No seizures or epileptiform discharges were seen throughout the recording. - LTM EEG report for today AM (3/9): Continuous slow, generalized; Excessive beta, generalized. This study is suggestive of moderate diffuse encephalopathy, nonspecific etiology. The excessive beta activity seen in the background is most likely due to the effect of benzodiazepine and is a benign EEG pattern. No seizures or definite epileptiform discharges were seen throughout the recording.  - DDx:  - His breakthrough seizures prior to admission were most likely secondary to partial medication compliance (suspected noncompliance with eslicarbazepine, as his VPA level on admission was therapeutic.  - Also suspected is possible substance abuse as a trigger for breakthrough seizures  - His 3 days of sleep deprivation also likely lowered the seizure threshold.  Girlfriend states that his sleep deprivation was due to anxiety regarding possible sudden death during sleep from a seizure. Also possibly manifesting a manic phase contributing to sleep deprivation. - Psychiatric: Sleep deprivation may also have contributed to his acute onset of psychosis/agitation. Characteristics of his behavior in the ED and yesterday would be compatible with an agitated delirium.  - Given the combination of refractory agitation resembling a possible delirium, in conjunction with his history of seizures, an autoimmune encephalopathy such as anti-NMDA receptor encephalitis should also be considered.    Recommendations: - Anticonvulsant regimen:  - Decreasing IV valproic 25% to a total daily dose of 1500 mg, divided TID. Skipping AM dose due to high VPA level and resuming at 1800.   - Continue his home eslicarbazepine   - Phenobarbital now at reduced dosage regimen of 65 mg IV BID, which seems to be helping regarding his sedation - Ativan PRN seizure recurrence. Can also be used for agitation - Psychiatry is consulting and managing his antipsychotic regimen. Appreciate their recommendations towards titrating the patient towards a stable, non-agitated and preferably non-sedated state.  - Continue to monitor and manage agitation: Appreciate Psychiatry input.  - Ammonia level (ordered) - Anti-NMDA receptor autoantibody serum level has been ordered - Given concern for possible autoimmune encephalopathy, would obtain MRI brain w/wo contrast. Will need to be sedated for MRI. Compare for stability relative to prior MRI from 2021.  - ESR and CRP (ordered)    LOS: 4 days   '@Electronically'$  signed: Dr. Kerney Elbe 12/01/2022  11:16 AM

## 2022-12-02 DIAGNOSIS — R569 Unspecified convulsions: Secondary | ICD-10-CM | POA: Diagnosis not present

## 2022-12-02 LAB — CBC
HCT: 40.7 % (ref 39.0–52.0)
Hemoglobin: 13.8 g/dL (ref 13.0–17.0)
MCH: 28.3 pg (ref 26.0–34.0)
MCHC: 33.9 g/dL (ref 30.0–36.0)
MCV: 83.6 fL (ref 80.0–100.0)
Platelets: 238 10*3/uL (ref 150–400)
RBC: 4.87 MIL/uL (ref 4.22–5.81)
RDW: 13.2 % (ref 11.5–15.5)
WBC: 6.6 10*3/uL (ref 4.0–10.5)
nRBC: 0 % (ref 0.0–0.2)

## 2022-12-02 LAB — GLUCOSE, CAPILLARY
Glucose-Capillary: 109 mg/dL — ABNORMAL HIGH (ref 70–99)
Glucose-Capillary: 95 mg/dL (ref 70–99)
Glucose-Capillary: 98 mg/dL (ref 70–99)
Glucose-Capillary: 126 mg/dL — ABNORMAL HIGH (ref 70–99)
Glucose-Capillary: 121 mg/dL — ABNORMAL HIGH (ref 70–99)
Glucose-Capillary: 100 mg/dL — ABNORMAL HIGH (ref 70–99)

## 2022-12-02 LAB — MAGNESIUM: Magnesium: 2.1 mg/dL (ref 1.7–2.4)

## 2022-12-02 LAB — PHOSPHORUS: Phosphorus: 4.2 mg/dL (ref 2.5–4.6)

## 2022-12-02 LAB — BASIC METABOLIC PANEL
Anion gap: 13 (ref 5–15)
BUN: 10 mg/dL (ref 6–20)
CO2: 21 mmol/L — ABNORMAL LOW (ref 22–32)
Calcium: 9.2 mg/dL (ref 8.9–10.3)
Chloride: 105 mmol/L (ref 98–111)
Creatinine, Ser: 0.85 mg/dL (ref 0.61–1.24)
GFR, Estimated: >60 mL/min (ref >60)
Glucose, Bld: 139 mg/dL — ABNORMAL HIGH (ref 70–99)
Potassium: 4.1 mmol/L (ref 3.5–5.1)
Sodium: 139 mmol/L (ref 135–145)

## 2022-12-02 LAB — AMMONIA: Ammonia: 66 umol/L — ABNORMAL HIGH (ref 9–35)

## 2022-12-02 LAB — CK: Total CK: 1033 U/L — ABNORMAL HIGH (ref 49–397)

## 2022-12-02 MED ORDER — HALOPERIDOL LACTATE 5 MG/ML IJ SOLN
5.0000 mg | Freq: Once | INTRAMUSCULAR | Status: AC
  Administered 2022-12-02: 5 mg via INTRAVENOUS
  Filled 2022-12-02: qty 1

## 2022-12-02 MED ORDER — LORAZEPAM 1 MG PO TABS
2.0000 mg | ORAL_TABLET | Freq: Two times a day (BID) | ORAL | Status: DC
  Administered 2022-12-02 – 2022-12-03 (×2): 2 mg
  Filled 2022-12-02 (×2): qty 2

## 2022-12-02 MED ORDER — VALPROATE SODIUM 100 MG/ML IV SOLN
250.0000 mg | Freq: Every day | INTRAVENOUS | Status: DC
  Administered 2022-12-03 – 2022-12-05 (×3): 250 mg via INTRAVENOUS
  Filled 2022-12-02 (×4): qty 2.5

## 2022-12-02 MED ORDER — VALPROATE SODIUM 100 MG/ML IV SOLN
500.0000 mg | Freq: Two times a day (BID) | INTRAVENOUS | Status: DC
  Administered 2022-12-02 – 2022-12-04 (×6): 500 mg via INTRAVENOUS
  Filled 2022-12-02 (×8): qty 5

## 2022-12-02 MED ORDER — LORAZEPAM 2 MG/ML IJ SOLN
4.0000 mg | Freq: Once | INTRAMUSCULAR | Status: AC
  Administered 2022-12-02: 4 mg via INTRAVENOUS

## 2022-12-02 MED ORDER — RISPERIDONE 0.5 MG PO TABS
1.0000 mg | ORAL_TABLET | Freq: Every day | ORAL | Status: DC
  Administered 2022-12-03: 1 mg
  Filled 2022-12-02: qty 2

## 2022-12-02 MED ORDER — VALPROATE SODIUM 100 MG/ML IV SOLN: 250.0000 mg | Freq: Every day | INTRAVENOUS | Status: DC

## 2022-12-02 MED ORDER — RISPERIDONE 0.5 MG PO TABS
2.0000 mg | ORAL_TABLET | Freq: Every day | ORAL | Status: DC
  Administered 2022-12-02: 2 mg
  Filled 2022-12-02: qty 4

## 2022-12-02 NOTE — Progress Notes (Signed)
eLink Physician-Brief Progress Note Patient Name: Dylan Moon DOB: [birth year] MRN: [id number]   Date of Service  12/02/2022  HPI/Events of Note   27 y.o. male with hx of PTSD, ADHD, ?schizoaffective disorder, depression, anxiety, and seizures, admitted medically for 11/26/2022 11:30 AM for seizures   eICU Interventions  Renew  bil soft wrist restraints     Intervention Category Minor Interventions: Agitation / anxiety - evaluation and management  Eliah Marquard 12/02/2022, 10:00 PM

## 2022-12-02 NOTE — Consult Note (Incomplete)
[facility] Face-to-Face Psychiatric Evaluation   Service Date: December 02, 2022 LOS:  LOS: 5 days  Reason for Consult: "suicidal attempt" Consult by: Donney Dice  Assessment  Dylan Moon is a 27 y.o. male with hx of PTSD, ADHD, ?schizoaffective disorder, depression, anxiety, and seizures, admitted medically for 11/26/2022 11:30 AM for seizures.   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 He presented to the hospital for seizures; psychiatry is involved for symptomatic management of agitation which has been quite severe. Overall theme of difficulty balancing agitation and sedation. Generally has improved over past 2-3 days since transfer to ICU and initiation of phenobarbital taper. Medications are limited by [employer] (cannot administer previously effective invega). He is currently on risperidone 1 BID (weighing lowering of seizure threshold against ongoing agitation). Mom has given surrogate consent for this with the understanding it will be switched to [facility] when able.   Patient requiring intermittent versed PRN (last one being 2:30 AM today). Will increase risperidone PM dose and decrease ativan dosage to reduce daytime sedation but also better control agitation. Ammonia level continues to be high at 61 and lactulose is continuing.   ***  He is currently under IVC and would recommend continuing it along with 1:1 sitter. At this point given confusion and agitation at least partially due to postictal state, cannot determine if inpt psych will benefit pt. Will reassess this daily. Differential remains the same as yesterday (schizoaffective disorder vs bipolar disorder vs postictal psychosis vs substance induced psychosis).   Plan  ## Safety and Observation Level:  - Based  on my clinical evaluation, I estimate the patient to be at high risk of self harm in the current setting. Recommend 1:1 sitter and restraints as needed, following restraint policies and de-escalating when and where possible   #Severe Agitation #Seizures #Bipolar disorder #hx of PTSD #hx of ADHD Bipolar Disorder vs Postictal psychosis vs schizoaffective disorder vs substance induced psychosis -Geodon 10 mg IV q6h prn agitation  -Decrease Ativan to 1 mg PO/IV q8h -Increase risperidone to 1 mg in AM and 2 MG in PM -Continue Clonidine patch 0.1 mg weekly -Defer depcaon and phenobarbital dosing to neuro and primary   - put in depakote level, was supratherapeutic, neuro adjusted down.  - do not want to start second antipsychotic at this time.   -1:1 sitter Pertinent labs: ECG 11/27/22 NSR Qtc 437, mild increase in Cr 1.2>1.48, CBC wnl  ## Disposition:  -- pending -- reach out to [facility] for urgent appt prior to dc.   Thank you for this consult request. Recommendations have been communicated to the primary team.  We will continue to follow at this time.   France Ravens, MD   Relevant History  Relevant Aspects of Hospital Course:  Admitted on 11/26/2022 for seizures 3/6 rapid called due to severe agitation. Admitted to ICU and started on phenobarbital.  Patient Report:  Pt sedated when I examined - woke briefly to voice. Difficulty following simple commands (did "blink once, blink twice" with some success, couldn't get him to give thumbs up). Was out of leg restraints and tolerated physical exam, then fell back asleep. Did not further wake up as felt it would be harmful to pt (tends to become agitated when woken up disoriented, perpetuating delirium-->agitation-->PRN-->sedation cycle).   Collateral information:  None today 3/10  Spoke with girlfriend in room; answered questions.   Spoke to mother who asked several times for invega shot, explained this is not possible (need to prove oral  tolerability/efficacy) but thanked her for advocating.   Psychiatric History:  Never been psychiatrically hospitalized. He did establish care with Dr. Hassell Done at [facility] who diagnosed patient with Cannabis use disorder, GAD w/ panic attacks, and PTSD and he was started on mirtazapine. Sleep cycling not disclosed at that appt.    Social History:  Tobacco use: 3 black and mild per day Alcohol use: denies Drug use: Vape THC and smokes 3-4 joints of marijuana per day  Family History:  The patient's family history includes Cancer in an other family member; Diabetes in his father and mother; Hypertension in his mother; Seizures in his maternal grandfather.  Medical History: Past Medical History:  Diagnosis Date   ADHD    Bipolar 1 disorder ([place])    Schizoaffective disorder ([place])    Scoliosis    Seizures ([place])    most recent 12/02/17    Surgical History: Past Surgical History:  Procedure Laterality Date   NO PAST SURGERIES      Medications:   Current Facility-Administered Medications:    acetaminophen (TYLENOL) tablet 650 mg, 650 mg, Oral, Q4H PRN, Noemi Chapel P, DO   Chlorhexidine Gluconate Cloth 2 % PADS 6 each, 6 each, Topical, Daily, Olalere, Adewale A, MD, 6 each at 12/01/22 0930   cloNIDine (CATAPRES) tablet 0.1 mg, 0.1 mg, Per Tube, TID, Omar Person, NP, 0.1 mg at 12/02/22 0907   diphenhydrAMINE (BENADRYL) injection 50 mg, 50 mg, Intravenous, Q6H PRN, Bowser, Grace E, NP, 50 mg at 11/30/22 1935   docusate (COLACE) 50 MG/5ML liquid 100 mg, 100 mg, Per Tube, BID, Eubanks, Katalina M, NP, 100 mg at 12/02/22 0906   enoxaparin (LOVENOX) injection 40 mg, 40 mg, Subcutaneous, Daily, Noemi Chapel P, DO, 40 mg at [id number]   Eslicarbazepine Acetate TABS 1,600 mg, 1,600 mg, Per Tube, Daily, Olalere, Adewale A, MD, 1,600 mg at 12/01/22 0953   feeding supplement (OSMOLITE 1.5 CAL) liquid 1,000 mL, 1,000 mL, Per Tube, Continuous, Olalere, Adewale A, MD, Last  Rate: 65 mL/hr at 12/02/22 0500, Infusion Verify at 12/02/22 0500   feeding supplement (PROSource TF20) liquid 60 mL, 60 mL, Per Tube, Daily, Olalere, Adewale A, MD, 60 mL at 12/02/22 0906   lactulose (CHRONULAC) 10 GM/15ML solution 30 g, 30 g, Per Tube, TID, Anders Simmonds, MD, 30 g at 12/02/22 0906   LORazepam (ATIVAN) injection 4 mg, 4 mg, Intravenous, PRN, Eppie Gibson, MD   LORazepam (ATIVAN) tablet 2 mg, 2 mg, Per Tube, Q8H, 2 mg at 12/02/22 0513 **OR** [DISCONTINUED] LORazepam (ATIVAN) injection 2 mg, 2 mg, Intravenous, Q8H, Olalere, Adewale A, MD, 2 mg at 12/01/22 0614   midazolam (VERSED) injection 2-4 mg, 2-4 mg, Intravenous, Q1H PRN, 4 mg at 12/02/22 0232 **OR** midazolam (VERSED) injection 2-4 mg, 2-4 mg, Intramuscular, Q1H PRN, Noemi Chapel P, DO, 2 mg at 12/01/22 0813   Oral care mouth rinse, 15 mL, Mouth Rinse, 4 times per day, Olalere, Adewale A, MD, 15 mL at 12/01/22 2200   Oral care mouth rinse, 15 mL, Mouth Rinse, PRN, Olalere, Adewale A, MD   PHENObarbital (LUMINAL) injection 65 mg, 65 mg, Intravenous, BID, Eubanks, Katalina M, NP, 65 mg at 12/02/22 0906   polyethylene glycol (MIRALAX / GLYCOLAX) packet 17 g, 17 g, Oral, Daily PRN, Carlis Abbott,  Mickel Baas P, DO, 17 g at 12/01/22 1249   risperiDONE (RISPERDAL) tablet 1 mg, 1 mg, Per Tube, BID, Brand Males, MD, 1 mg at 12/02/22 [id number]   thiamine (VITAMIN B1) tablet 100 mg, 100 mg, Per Tube, Daily, Hayden Pedro M, NP, 100 mg at 12/02/22 0906   valproate (DEPACON) 500 mg in dextrose 5 % 50 mL IVPB, 500 mg, Intravenous, Q8H, Kerney Elbe, MD, Last Rate: 55 mL/hr at 12/02/22 0521, 500 mg at 12/02/22 0521   white petrolatum (VASELINE) gel, , Topical, PRN, Julian Hy, DO  Allergies: Allergies  Allergen Reactions   Keppra [Levetiracetam] Other (See Comments)    Irritability    Tramadol Other (See Comments)    Contraindicated with current medications (??)   Vimpat [Lacosamide] Other (See Comments)    Causes anger        Objective  Vital signs:  Temp:  [98.2 F (36.8 C)-100.1 F (37.8 C)] 98.3 F (36.8 C) (03/11 0803) Pulse Rate:  [76-106] 80 (03/11 0754) Resp:  [16-26] 26 (03/11 0754) BP: (115-173)/(61-120) 138/80 (03/11 0754) SpO2:  [95 %-100 %] 100 % (03/11 0754)  Psychiatric Specialty Exam:  Presentation  General Appearance: Disheveled Eye Contact: minimal  Speech: stated name, slurred Speech Volume: n/a  Mood and Affect  Mood: did not assess Affect: somnolent  Thought Process  Thought Processes: unable to assess Descriptions of Associations: unable to assess Orientation: not assessed Thought Content:unable to assess Hallucinations: unable to assess Ideas of Reference: unable to assess Suicidal Thoughts: unable to assess Homicidal Thoughts: unable to assess  Sensorium  Memory:unable to assess Judgment: unable to assess Insight: unable to assess  Executive Functions  Concentration: unable to assess Attention Span:unable to assess Recall:unable to assess Fund of Knowledge: unable to assess Language: unable to assess  Psychomotor Activity  Psychomotor Activity: decreased   Sleep  Sleep: oversedated  Physical exam: No rigidity or coghweeling in b/l l/e.  ROS: unable to assess, mental status change

## 2022-12-02 NOTE — Consult Note (Signed)
[facility] Face-to-Face Psychiatric Evaluation   Service Date: December 02, 2022 LOS:  LOS: 5 days  Reason for Consult: "suicidal attempt" Consult by: Donney Dice DO  Assessment  Dylan Moon is a 27 y.o. male with hx of PTSD, ADHD, ?schizoaffective disorder, depression, anxiety, and seizures, admitted medically for 11/26/2022 11:30 AM for seizures.   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 He presented to the hospital for seizures; psychiatry is involved for symptomatic management of agitation which has been quite severe. Overall theme of difficulty balancing agitation and sedation. Generally has improved over past 2-3 days since transfer to ICU and initiation of phenobarbital taper. Medications are limited by [facility] (cannot administer previously effective invega). He is currently on risperidone 1 BID (weighing lowering of seizure threshold against ongoing agitation). Mom has given surrogate consent for this with the understanding it will be switched to [facility] when able.   Patien was sedated but arousable at time of evaluation. He had received ativan due to some agitation but not violent and not in restraints at this time. Able to follow some commands. No rigidity noted in legs. Seems to be less agitated per sitter and nursing. Phenobarbital discontinued per neuro. Decreasing ativan to 2 mg bid and increasing PM risperidone to 2 mg to reduce daytime sedation but prevent further agitation.  He is currently under IVC and would recommend continuing it along with 1:1 sitter. At this point given confusion and agitation at least partially due to postictal state, cannot determine if inpt psych will benefit pt. Will reassess this daily. Differential remains the same as yesterday  (schizoaffective disorder vs bipolar disorder vs postictal psychosis vs substance induced psychosis).   Plan  ## Safety and Observation Level:  - Based on my clinical evaluation, I estimate the patient to be at high risk of self harm in the current setting. Recommend 1:1 sitter and restraints as needed, following restraint policies and de-escalating when and where possible   #Severe Agitation #Seizures #Bipolar disorder #hx of PTSD #hx of ADHD Bipolar Disorder vs Postictal psychosis vs schizoaffective disorder vs substance induced psychosis -DECREASE lorazepam to 2 mg IV or PO q12h scheduled -INCREASE risperidone to 1 mg in AM and 2 mg in PM -Continue Clonidine 0.1 mg tid -Defer depcaon and phenobarbital dosing to neuro and primary   - put in depakote level, was supratherapeutic, neuro adjusted down.  - do not want to start second antipsychotic at this time.   -1:1 sitter Pertinent labs: ECG 11/27/22 NSR Qtc 437, mild increase in Cr 1.2>1.48, CBC wnl  ## Disposition:  -- pending -- reach out to [facility] for urgent appt prior to dc.   Thank you for this consult request. Recommendations have been communicated to the primary team.  We will continue to follow at this time.   France Ravens, MD   Relevant History  Relevant Aspects of Hospital Course:  Admitted on 11/26/2022 for seizures 3/6 rapid called due to severe agitation. Admitted to ICU and started on phenobarbital.  Patient Report:  Pt sedated when I examined - woke briefly to voice. Followed some commands regarding leg movement but fell asleep quickly. Was able to state name but not much more beyond that. Was out of violent restraints. Did not further wake up as felt it would be harmful to pt (  tends to become agitated when woken up disoriented, perpetuating delirium-->agitation-->PRN-->sedation cycle).   Collateral information:  None today 3/10  Spoke with girlfriend in room; answered questions.   Spoke to mother who asked  several times for invega shot, explained this is not possible (need to prove oral tolerability/efficacy) but thanked her for advocating.   Psychiatric History:  Never been psychiatrically hospitalized. He did establish care with Dr. Hassell Done at [facility] who diagnosed patient with Cannabis use disorder, GAD w/ panic attacks, and PTSD and he was started on mirtazapine. Sleep cycling not disclosed at that appt.    Social History:  Tobacco use: 3 black and mild per day Alcohol use: denies Drug use: Vape THC and smokes 3-4 joints of marijuana per day  Family History:  The patient's family history includes Cancer in an other family member; Diabetes in his father and mother; Hypertension in his mother; Seizures in his maternal grandfather.  Medical History: Past Medical History:  Diagnosis Date   ADHD    Bipolar 1 disorder ([place])    Schizoaffective disorder ([place])    Scoliosis    Seizures ([place])    most recent 12/02/17    Surgical History: Past Surgical History:  Procedure Laterality Date   NO PAST SURGERIES      Medications:   Current Facility-Administered Medications:    acetaminophen (TYLENOL) tablet 650 mg, 650 mg, Oral, Q4H PRN, Noemi Chapel P, DO   Chlorhexidine Gluconate Cloth 2 % PADS 6 each, 6 each, Topical, Daily, Olalere, Adewale A, MD, 6 each at 12/01/22 0930   cloNIDine (CATAPRES) tablet 0.1 mg, 0.1 mg, Per Tube, TID, Omar Person, NP, 0.1 mg at 12/02/22 0907   diphenhydrAMINE (BENADRYL) injection 50 mg, 50 mg, Intravenous, Q6H PRN, Bowser, Grace E, NP, 50 mg at 11/30/22 1935   docusate (COLACE) 50 MG/5ML liquid 100 mg, 100 mg, Per Tube, BID, Eubanks, Katalina M, NP, 100 mg at 12/02/22 0906   enoxaparin (LOVENOX) injection 40 mg, 40 mg, Subcutaneous, Daily, Julian Hy, DO, 40 mg at [id number]   Eslicarbazepine Acetate TABS 1,600 mg, 1,600 mg, Per Tube, Daily, Olalere, Adewale A, MD, 1,600 mg at 12/02/22 1122   feeding supplement (OSMOLITE 1.5  CAL) liquid 1,000 mL, 1,000 mL, Per Tube, Continuous, Olalere, Adewale A, MD, Last Rate: 65 mL/hr at 12/02/22 0500, Infusion Verify at 12/02/22 0500   feeding supplement (PROSource TF20) liquid 60 mL, 60 mL, Per Tube, Daily, Olalere, Adewale A, MD, 60 mL at 12/02/22 0906   lactulose (CHRONULAC) 10 GM/15ML solution 30 g, 30 g, Per Tube, TID, Anders Simmonds, MD, 30 g at 12/02/22 0906   LORazepam (ATIVAN) tablet 2 mg, 2 mg, Per Tube, Q8H, 2 mg at 12/02/22 1324 **OR** [DISCONTINUED] LORazepam (ATIVAN) injection 2 mg, 2 mg, Intravenous, Q8H, Olalere, Adewale A, MD, 2 mg at 12/01/22 0614   midazolam (VERSED) injection 2-4 mg, 2-4 mg, Intravenous, Q1H PRN, 4 mg at 12/02/22 0232 **OR** midazolam (VERSED) injection 2-4 mg, 2-4 mg, Intramuscular, Q1H PRN, Noemi Chapel P, DO, 2 mg at 12/01/22 0813   Oral care mouth rinse, 15 mL, Mouth Rinse, 4 times per day, Olalere, Adewale A, MD, 15 mL at 12/02/22 0815   Oral care mouth rinse, 15 mL, Mouth Rinse, PRN, Olalere, Adewale A, MD   polyethylene glycol (MIRALAX / GLYCOLAX) packet 17 g, 17 g, Oral, Daily PRN, Noemi Chapel P, DO, 17 g at 12/01/22 1249   risperiDONE (RISPERDAL) tablet 1 mg, 1 mg, Per Tube,  BID, Brand Males, MD, 1 mg at 12/02/22 [id number]   thiamine (VITAMIN B1) tablet 100 mg, 100 mg, Per Tube, Daily, Omar Person, NP, 100 mg at 12/02/22 0906   [START ON 12/03/2022] valproate (DEPACON) 250 mg in dextrose 5 % 50 mL IVPB, 250 mg, Intravenous, Daily, Camelia Phenes, MD   valproate (DEPACON) 500 mg in dextrose 5 % 50 mL IVPB, 500 mg, Intravenous, BID, Camelia Phenes, MD   white petrolatum (VASELINE) gel, , Topical, PRN, Julian Hy, DO  Allergies: Allergies  Allergen Reactions   Keppra [Levetiracetam] Other (See Comments)    Irritability    Tramadol Other (See Comments)    Contraindicated with current medications (??)   Vimpat [Lacosamide] Other (See Comments)    Causes anger       Objective  Vital signs:  Temp:  [98.2 F (36.8 C)-99.9  F (37.7 C)] 98.9 F (37.2 C) (03/11 1100) Pulse Rate:  [76-106] 90 (03/11 1100) Resp:  [16-26] 20 (03/11 1100) BP: (115-173)/(61-120) 140/76 (03/11 1100) SpO2:  [95 %-100 %] 100 % (03/11 1100)  Psychiatric Specialty Exam:  Presentation  General Appearance: Disheveled Eye Contact: minimal  Speech: stated name, slurred Speech Volume: n/a  Mood and Affect  Mood: did not assess Affect: somnolent  Thought Process  Thought Processes: unable to assess Descriptions of Associations: unable to assess Orientation: not assessed Thought Content:unable to assess Hallucinations: unable to assess Ideas of Reference: unable to assess Suicidal Thoughts: unable to assess Homicidal Thoughts: unable to assess  Sensorium  Memory:unable to assess Judgment: unable to assess Insight: unable to assess  Executive Functions  Concentration: unable to assess Attention Span:unable to assess Recall:unable to assess Fund of Knowledge: unable to assess Language: unable to assess  Psychomotor Activity  Psychomotor Activity: decreased   Sleep  Sleep: oversedated  Physical exam: No rigidity or coghweeling in b/l l/e.  ROS: unable to assess, mental status change

## 2022-12-02 NOTE — Progress Notes (Signed)
NAME:  Dylan Moon, MRN:  [id number], DOB:  [birth year], LOS: 5 ADMISSION DATE:  11/26/2022, CONSULTATION DATE:  11/27/2022 REFERRING MD:  Dr. Gwendlyn Deutscher , CHIEF COMPLAINT: Severe agitation   History of Present Illness:  Dylan Moon is a 27 year old male with past medical history significant for seizures, ADHD, bipolar, schizoaffective disease, scoliosis, and medical noncompliance who presented to the emergency department 3/5 for seizures, felt secondary to medication noncompliance.  Patient was admitted per family medicine teaching service with neurology consultation.  Mid morning of 3/6 patient progressively became more violent and agitated towards staff resulting in security being called, application of violent restraints, and eventually addition of chemical restraints as well. Given need for close monitoring of mentation and airway after chemical restraints PCCM was consulted    Pertinent  Medical History  Seizures, ADHD, bipolar, schizoaffective disease, scoliosis, and medical noncompliance  Significant Hospital Events: Including procedures, antibiotic start and stop dates in addition to other pertinent events   3/5 presented to ED after 1.5-minute tonic-clonic seizure per family 3/6 severely agitated and combative towards staff resulting in both violent and chemical restraints  3/8 continued severe agitation with escalating behavior and requiring multiple sedating medications until last night, calm this morning  3/11 No acute issues overnight, required one dose of versed for agitation   Interim History / Subjective:  Able to arouse to verbal stimuli but quickly falls to sleep when no stimulated  Remains in upper soft upper extremity restraints to [facility] medical equipment no longer violent   Objective   Blood pressure 115/61, pulse 76, temperature 98.2 F (36.8 C), temperature source Oral, resp. rate 19, weight 84.4 kg, SpO2 100 %.        Intake/Output Summary (Last 24 hours) at  12/02/2022 0707 Last data filed at 12/02/2022 0500 Gross per 24 hour  Intake 1475 ml  Output 725 ml  Net 750 ml    Filed Weights   11/29/22 0500 11/30/22 0459 12/01/22 0500  Weight: 85.2 kg 86.5 kg 84.4 kg   Physical Exam  General: Acute ill appearing adult male lying in bed, in NAD HEENT: [place]/AT, MM pink/moist, PERRL,  Neuro: Will arouse to verbal stimuli, non-focal  CV: s1s2 regular rate and rhythm, no murmur, rubs, or gallops,  PULM:  Clear to auscultation, no increased work of breathing, no added breath sounds  GI: soft, bowel sounds active in all 4 quadrants, non-tender, non-distended, tolerating TF Extremities: warm/dry, no edema  Skin: no rashes or lesions  Resolved Hospital Problem list   AKI  Assessment & Plan:  Epilepsy with longstanding seizure history, vagal nerve stimulator. P: Primary per neurology  Maintain neuro protective measures; goal for eurothermia, euglycemia, eunatermia, normoxia, and PCO2 goal of 35-40 Nutrition and bowel regiment  Seizure precautions  Continue IV Valproic acid and oral Esilcarbazepine Aspirations precautions  MRI pending   History of medication noncompliance Acute psychosis with aggressive behavior -His previous outpatient diagnoses of ADHD and anxiety do not fully explain his presentation, which appears to be schizoaffective disorder, bipolar disorder, or post-ictal psychosis -Severe aggression and agitation has been very difficult to control over the last two days requiring benzos, anti-psychotics, benadryl and ketamine P: Psychiatry following appreciate assistance  No longer requiring violent restraints, non-violent restraints in place to maintain medical equipment   Continue Clonidine, Ativan, and Risperidone Further wean Phenobarbital to daily vs discontinue  Safety sitter in place  Monitor QTc   At risk malnutrition due to decreased oral intake  P: Continue TFs  Best Practice (right click and "Reselect all SmartList  Selections" daily)   Diet/type: NPO until mental status improves DVT prophylaxis: LMWH GI prophylaxis: N/A Lines: N/A Foley:  External cath in place  Code Status:  full code Last date of multidisciplinary goals of care discussion: Family updated at bedside 3/9  Critical care time:     CRITICAL CARE Performed by: Hasani Diemer D. Harris   Total critical care time: 35 minutes  Critical care time was exclusive of separately billable procedures and treating other patients.  Critical care was necessary to treat or prevent imminent or life-threatening deterioration.  Critical care was time spent personally by me on the following activities: development of treatment plan with patient and/or surrogate as well as nursing, discussions with consultants, evaluation of patient's response to treatment, examination of patient, obtaining history from patient or surrogate, ordering and performing treatments and interventions, ordering and review of laboratory studies, ordering and review of radiographic studies, pulse oximetry and re-evaluation of patient's condition.   Brittanee Ghazarian D. Harris, NP-C [facility] Personal contact information can be found on Amion  If no contact or response made please call [phone] 12/02/2022, 7:09 AM

## 2022-12-02 NOTE — Progress Notes (Addendum)
Subjective: Very agitated this morning, trying to get out of bed. Bedside sitter reports he has been this way at least since 7am when sitter's shift started. Sitter got report that patient did get to sleep overnight. Neuro exam limited due to agitation, gross disorientation.  Objective: Current vital signs: BP 138/80   Pulse 80   Temp 98.3 F (36.8 C) (Oral)   Resp (!) 26   Wt 84.4 kg   SpO2 100%   BMI 25.95 kg/m  Vital signs in last 24 hours: Temp:  [98.2 F (36.8 C)-100.1 F (37.8 C)] 98.3 F (36.8 C) (03/11 0803) Pulse Rate:  [76-106] 80 (03/11 0754) Resp:  [16-26] 26 (03/11 0754) BP: (115-173)/(61-120) 138/80 (03/11 0754) SpO2:  [95 %-100 %] 100 % (03/11 0754)  Intake/Output from previous day: 03/10 0701 - 03/11 0700 In: 1475 [NG/GT:1420; IV Piggyback:55] Out: 725 [Urine:725] Intake/Output this shift: No intake/output data recorded. Nutritional status:  Diet Order     None      General:  acutely agitated HEENT: normocephalic and atraumatic Respiratory: non-labored breathing and on nasal cannula but not positioned properly in nose Extremities: moving all extremities spontaneously Gastrointestinal: non-distended Cardiovascular: regular rate   Neurologic Exam: Mental Status: Marcelo E Reifsnyder is agitated; he is not oriented to self, place, time, or situation. Speech was slurred. He was unable to follow any commands.  Cranial Nerves: II:  Visual fields  unable to assess ; pupils equal, round, reactive to light and accommodation III,IV, VI: no ptosis, unable to assess extra-ocular motions V,VII: smile unable to assess, facial light touch sensation unable to assess IX,X: uvula unable to assess XI: shoulder unable to assess XII: unable to assess  Motor: Upper extremities: Right 5/5 full power Left 5/5 full power  Lower extremities: Right 5/5 full power Left 5/5 full power  Tone and bulk: normal tone throughout; no atrophy noted  Sensory: unable to  assess  Cerebellar: Finger-to-nose test not assessed during this encounter, heel-to-shin test not assessed during this encounter  Gait: not observed during encounter  Lab Results: Results for orders placed or performed during the hospital encounter of 11/26/22 (from the past 48 hour(s))  Glucose, capillary     Status: Abnormal   Collection Time: 11/30/22  7:38 AM  Result Value Ref Range   Glucose-Capillary 121 (H) 70 - 99 mg/dL    Comment: Glucose reference range applies only to samples taken after fasting for at least 8 hours.  Glucose, capillary     Status: Abnormal   Collection Time: 11/30/22 12:02 PM  Result Value Ref Range   Glucose-Capillary 122 (H) 70 - 99 mg/dL    Comment: Glucose reference range applies only to samples taken after fasting for at least 8 hours.  Glucose, capillary     Status: Abnormal   Collection Time: 11/30/22  3:10 PM  Result Value Ref Range   Glucose-Capillary 137 (H) 70 - 99 mg/dL    Comment: Glucose reference range applies only to samples taken after fasting for at least 8 hours.  Valproic acid level     Status: Abnormal   Collection Time: 11/30/22  5:49 PM  Result Value Ref Range   Valproic Acid Lvl 107 (H) 50.0 - 100.0 ug/mL    Comment: Performed at [facility] [address] [place], [place] [address]  Glucose, capillary     Status: Abnormal   Collection Time: 11/30/22  7:52 PM  Result Value Ref Range   Glucose-Capillary 109 (H) 70 - 99  mg/dL    Comment: Glucose reference range applies only to samples taken after fasting for at least 8 hours.  Glucose, capillary     Status: Abnormal   Collection Time: 11/30/22 11:11 PM  Result Value Ref Range   Glucose-Capillary 140 (H) 70 - 99 mg/dL    Comment: Glucose reference range applies only to samples taken after fasting for at least 8 hours.  CBC     Status: None   Collection Time: 12/01/22  1:03 AM  Result Value Ref Range   WBC 7.2 4.0 - 10.5 K/uL   RBC 4.72 4.22 - 5.81 MIL/uL    Hemoglobin 13.4 13.0 - 17.0 g/dL   HCT 40.1 39.0 - 52.0 %   MCV 85.0 80.0 - 100.0 fL   MCH 28.4 26.0 - 34.0 pg   MCHC 33.4 30.0 - 36.0 g/dL   RDW 13.3 11.5 - 15.5 %   Platelets 201 150 - 400 K/uL   nRBC 0.0 0.0 - 0.2 %    Comment: Performed at [facility], [place] [address], [place], [place] [address]  Lactic acid, plasma     Status: None   Collection Time: 12/01/22  1:03 AM  Result Value Ref Range   Lactic Acid, Venous 1.2 0.5 - 1.9 mmol/L    Comment: Performed at [facility] [address] [place], [place] [address]  CK total and CKMB (cardiac)not at [facility]     Status: Abnormal   Collection Time: 12/01/22  1:03 AM  Result Value Ref Range   Total CK 1,140 (H) 49 - 397 U/L   CK, MB 1.3 0.5 - 5.0 ng/mL   Relative Index 0.1 0.0 - 2.5    Comment: Performed at [facility] [address] [place], [place] [address]  Comprehensive metabolic panel     Status: Abnormal   Collection Time: 12/01/22  1:03 AM  Result Value Ref Range   Sodium 140 135 - 145 mmol/L   Potassium 3.6 3.5 - 5.1 mmol/L   Chloride 104 98 - 111 mmol/L   CO2 24 22 - 32 mmol/L   Glucose, Bld 129 (H) 70 - 99 mg/dL    Comment: Glucose reference range applies only to samples taken after fasting for at least 8 hours.   BUN 7 6 - 20 mg/dL   Creatinine, Ser 0.94 0.61 - 1.24 mg/dL   Calcium 8.8 (L) 8.9 - 10.3 mg/dL   Total Protein 6.0 (L) 6.5 - 8.1 g/dL   Albumin 3.3 (L) 3.5 - 5.0 g/dL   AST 26 15 - 41 U/L   ALT 15 0 - 44 U/L   Alkaline Phosphatase 34 (L) 38 - 126 U/L   Total Bilirubin 0.6 0.3 - 1.2 mg/dL   GFR, Estimated >60 >60 mL/min    Comment: (NOTE) Calculated using the CKD-EPI Creatinine Equation (2021)    Anion gap 12 5 - 15    Comment: Performed at [facility], [place] [address], [place],  [address]  Magnesium     Status: None   Collection Time: 12/01/22  1:03 AM  Result Value Ref Range   Magnesium 2.2 1.7 - 2.4 mg/dL    Comment: Performed at [facility]  [address], [place],  [address]  Phosphorus     Status: None   Collection Time: 12/01/22  1:03 AM  Result Value Ref Range   Phosphorus 4.6 2.5 - 4.6 mg/dL    Comment: Performed at  [facility], [place] [address] [place], [place] [address]  Glucose, capillary     Status: Abnormal   Collection Time: 12/01/22  3:36 AM  Result Value Ref Range   Glucose-Capillary 120 (H) 70 - 99 mg/dL    Comment: Glucose reference range applies only to samples taken after fasting for at least 8 hours.  Glucose, capillary     Status: Abnormal   Collection Time: 12/01/22  7:53 AM  Result Value Ref Range   Glucose-Capillary 101 (H) 70 - 99 mg/dL    Comment: Glucose reference range applies only to samples taken after fasting for at least 8 hours.  Glucose, capillary     Status: None   Collection Time: 12/01/22 11:30 AM  Result Value Ref Range   Glucose-Capillary 98 70 - 99 mg/dL    Comment: Glucose reference range applies only to samples taken after fasting for at least 8 hours.  Ammonia     Status: Abnormal   Collection Time: 12/01/22 12:22 PM  Result Value Ref Range   Ammonia 61 (H) 9 - 35 umol/L    Comment: Performed at [facility] [address], [place], [place] [address]  Sedimentation rate     Status: None   Collection Time: 12/01/22 12:22 PM  Result Value Ref Range   Sed Rate 8 0 - 16 mm/hr    Comment: Performed at [facility] [address], [place], [place] [address]  C-reactive protein     Status: Abnormal   Collection Time: 12/01/22 12:22 PM  Result Value Ref Range   CRP 2.4 (H) <1.0 mg/dL    Comment: Performed at [facility], [place] [address], [place], [place] [address]  Glucose, capillary     Status: Abnormal   Collection Time: 12/01/22  3:34 PM  Result Value Ref Range   Glucose-Capillary 126 (H) 70 - 99 mg/dL    Comment: Glucose reference range applies only to samples taken after fasting for at least 8 hours.  Glucose, capillary     Status: Abnormal   Collection  Time: 12/01/22  7:54 PM  Result Value Ref Range   Glucose-Capillary 125 (H) 70 - 99 mg/dL    Comment: Glucose reference range applies only to samples taken after fasting for at least 8 hours.  Glucose, capillary     Status: Abnormal   Collection Time: 12/01/22 11:29 PM  Result Value Ref Range   Glucose-Capillary 123 (H) 70 - 99 mg/dL    Comment: Glucose reference range applies only to samples taken after fasting for at least 8 hours.  Basic metabolic panel     Status: Abnormal   Collection Time: 12/02/22  1:13 AM  Result Value Ref Range   Sodium 139 135 - 145 mmol/L   Potassium 4.1 3.5 - 5.1 mmol/L   Chloride 105 98 - 111 mmol/L   CO2 21 (L) 22 - 32 mmol/L   Glucose, Bld 139 (H) 70 - 99 mg/dL    Comment: Glucose reference range applies only to samples taken after fasting for at least 8 hours.   BUN 10 6 - 20 mg/dL   Creatinine, Ser 0.85 0.61 - 1.24 mg/dL   Calcium 9.2 8.9 - 10.3 mg/dL   GFR, Estimated >60 >60 mL/min    Comment: (NOTE) Calculated using the CKD-EPI Creatinine Equation (2021)    Anion gap 13 5 - 15    Comment: Performed at [facility] [place],  [place] [address]  CBC     Status: None   Collection Time: 12/02/22  1:13 AM  Result Value Ref Range   WBC 6.6 4.0 - 10.5 K/uL   RBC 4.87 4.22 - 5.81 MIL/uL   Hemoglobin 13.8 13.0 - 17.0 g/dL   HCT 40.7 39.0 - 52.0 %   MCV 83.6 80.0 - 100.0 fL   MCH 28.3 26.0 - 34.0 pg   MCHC 33.9 30.0 - 36.0 g/dL   RDW 13.2 11.5 - 15.5 %   Platelets 238 150 - 400 K/uL   nRBC 0.0 0.0 - 0.2 %    Comment: Performed at [facility], [place] [address] [place], [place] [address]  Magnesium     Status: None   Collection Time: 12/02/22  1:13 AM  Result Value Ref Range   Magnesium 2.1 1.7 - 2.4 mg/dL    Comment: Performed at [facility] [address], [place], [place] [address]  Phosphorus     Status: None   Collection Time: 12/02/22  1:13 AM  Result Value Ref Range   Phosphorus 4.2 2.5 - 4.6 mg/dL     Comment: Performed at [facility] [address] [place], [place] [address]  CK     Status: Abnormal   Collection Time: 12/02/22  1:13 AM  Result Value Ref Range   Total CK 1,033 (H) 49 - 397 U/L    Comment: Performed at [facility], [place] [address], [place], [place] [address]  Ammonia     Status: Abnormal   Collection Time: 12/02/22  1:13 AM  Result Value Ref Range   Ammonia 66 (H) 9 - 35 umol/L    Comment: Performed at [facility], [place] [address] [place], [place] [address]  Glucose, capillary     Status: Abnormal   Collection Time: 12/02/22  3:43 AM  Result Value Ref Range   Glucose-Capillary 109 (H) 70 - 99 mg/dL    Comment: Glucose reference range applies only to samples taken after fasting for at least 8 hours.  Glucose, capillary     Status: None   Collection Time: 12/02/22  8:02 AM  Result Value Ref Range   Glucose-Capillary 95 70 - 99 mg/dL    Comment: Glucose reference range applies only to samples taken after fasting for at least 8 hours.    Recent Results (from the past 240 hour(s))  MRSA Next Gen by PCR, Nasal     Status: None   Collection Time: 11/28/22  9:50 PM   Specimen: Nasal Mucosa; Nasal Swab  Result Value Ref Range Status   MRSA by PCR Next Gen NOT DETECTED NOT DETECTED Final    Comment: (NOTE) The GeneXpert MRSA Assay (FDA approved for NASAL specimens only), is one component of a comprehensive MRSA colonization surveillance program. It is not intended to diagnose MRSA infection nor to guide or monitor treatment for MRSA infections. Test performance is not FDA approved in patients less than 17 years old. Performed at [facility], [place] [address], [place], [place] [address]     Lipid Panel No results for input(s): "CHOL", "TRIG", "HDL", "CHOLHDL", "VLDL", "LDLCALC" in the last 72 hours.  Studies/Results: No results found.  Medications: Scheduled:  Chlorhexidine Gluconate Cloth  6 each Topical Daily   cloNIDine  0.1 mg Per  Tube TID   docusate  100 mg Per Tube BID   enoxaparin (LOVENOX) injection  40 mg Subcutaneous Daily  Eslicarbazepine Acetate  1,600 mg Per Tube Daily   feeding supplement (PROSource TF20)  60 mL Per Tube Daily   lactulose  30 g Per Tube TID   LORazepam  2 mg Per Tube Q8H   mouth rinse  15 mL Mouth Rinse 4 times per day   PHENObarbital  65 mg Intravenous BID   risperiDONE  1 mg Per Tube BID   thiamine  100 mg Per Tube Daily   Continuous:  feeding supplement (OSMOLITE 1.5 CAL) 65 mL/hr at 12/02/22 0500   valproate sodium 500 mg (12/02/22 0521)    Assessment: 28 year old male with 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 Has been admitted previously for similar clinical picture. 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   Exam this morning reveals an agitated patient who is awake and grossly disoriented, not following any commands, in mechanical restraints, and actively trying to get out of bed. No seizure-like activity noted. Patient currently on AEDs   Recommendations: - Anticonvulsant regimen:  - Decrease valproic acid from 500 mg TID to 250 mg in AM and 500 mg in afternoon and 500 in evening. - Continue his home eslicarbazepine   - Phenobarbital discontinued. Given prolonged halflife, would gradually clear from his body. - Ativan PRN seizure recurrence. Can also be used for agitation - Psychiatry is consulting and managing his antipsychotic regimen. Appreciate their recommendations towards titrating the patient towards a stable, non-agitated and preferably non-sedated state.  - Continue to  monitor and manage agitation: Appreciate Psychiatry input.  - Anti-NMDA receptor autoantibody serum level has been ordered, results pending - Given concern for possible autoimmune encephalopathy, would obtain MRI brain w/wo contrast. Will need to be sedated for MRI. Compare for stability relative to prior MRI from 2021.  - ESR 8, CRP 2.4 - Ammonia level, VPA level, phenobarbital level ordered   LOS: 5 days   Camelia Phenes, MD [facility] Intern (PGY-1) 12/02/2022 11:44 AM  NEUROHOSPITALIST ADDENDUM Performed a face to face diagnostic evaluation.   I have reviewed the contents of history and physical exam as documented by PA/ARNP/Resident and agree with above documentation.  I have discussed and formulated the above plan as documented. Edits to the note have been made as needed.  Impression/Key exam findings/Plan: still trying to balance his sedation with agitation. On my evaluation, he was somnolent but partially opens eyes to loud voice and will follow 1 step command with a lot of encouragement/coaxing. No focal deficit and antigravity movement noted in all extremities. Cranial nerves grossly intact but because of his agitation, unable to test them really well. VPA decreased given uptrending Ammonia. Repeat VPA and Ammonia levels in AM. Okay to discontinue phenobarb as this was primarily being used to control his agitation more so than an AED. Given its long halflife, should take a few days to gradually clear from his system.  We will continue to follow.  Donnetta Simpers, MD [facility] [id number]   If 7pm to 7am, please call on call as listed on AMION.

## 2022-12-03 ENCOUNTER — Inpatient Hospital Stay (HOSPITAL_COMMUNITY): Payer: Medicaid Other

## 2022-12-03 DIAGNOSIS — T1491XA Suicide attempt, initial encounter: Secondary | ICD-10-CM

## 2022-12-03 DIAGNOSIS — R451 Restlessness and agitation: Secondary | ICD-10-CM | POA: Diagnosis not present

## 2022-12-03 DIAGNOSIS — R4689 Other symptoms and signs involving appearance and behavior: Secondary | ICD-10-CM

## 2022-12-03 DIAGNOSIS — R569 Unspecified convulsions: Secondary | ICD-10-CM | POA: Diagnosis not present

## 2022-12-03 LAB — GLUCOSE, CAPILLARY
Glucose-Capillary: 83 mg/dL (ref 70–99)
Glucose-Capillary: 84 mg/dL (ref 70–99)
Glucose-Capillary: 112 mg/dL — ABNORMAL HIGH (ref 70–99)
Glucose-Capillary: 110 mg/dL — ABNORMAL HIGH (ref 70–99)
Glucose-Capillary: 94 mg/dL (ref 70–99)
Glucose-Capillary: 100 mg/dL — ABNORMAL HIGH (ref 70–99)

## 2022-12-03 LAB — BASIC METABOLIC PANEL
Anion gap: 9 (ref 5–15)
BUN: 10 mg/dL (ref 6–20)
CO2: 25 mmol/L (ref 22–32)
Calcium: 9 mg/dL (ref 8.9–10.3)
Chloride: 101 mmol/L (ref 98–111)
Creatinine, Ser: 0.8 mg/dL (ref 0.61–1.24)
GFR, Estimated: >60 mL/min (ref >60)
Glucose, Bld: 125 mg/dL — ABNORMAL HIGH (ref 70–99)
Potassium: 4.3 mmol/L (ref 3.5–5.1)
Sodium: 135 mmol/L (ref 135–145)

## 2022-12-03 LAB — CBC
HCT: 43.9 % (ref 39.0–52.0)
Hemoglobin: 14.2 g/dL (ref 13.0–17.0)
MCH: 27.8 pg (ref 26.0–34.0)
MCHC: 32.3 g/dL (ref 30.0–36.0)
MCV: 85.9 fL (ref 80.0–100.0)
Platelets: 263 10*3/uL (ref 150–400)
RBC: 5.11 MIL/uL (ref 4.22–5.81)
RDW: 13.2 % (ref 11.5–15.5)
WBC: 6.5 10*3/uL (ref 4.0–10.5)
nRBC: 0 % (ref 0.0–0.2)

## 2022-12-03 LAB — AMMONIA: Ammonia: 67 umol/L — ABNORMAL HIGH (ref 9–35)

## 2022-12-03 LAB — VALPROIC ACID LEVEL: Valproic Acid Lvl: 85 ug/mL (ref 50.0–100.0)

## 2022-12-03 LAB — PHOSPHORUS: Phosphorus: 5.4 mg/dL — ABNORMAL HIGH (ref 2.5–4.6)

## 2022-12-03 LAB — PHENOBARBITAL LEVEL: Phenobarbital: 56.6 ug/mL — ABNORMAL HIGH (ref 15.0–40.0)

## 2022-12-03 LAB — MAGNESIUM: Magnesium: 2.4 mg/dL (ref 1.7–2.4)

## 2022-12-03 LAB — N-METHYL-D-ASPARTATE RECPT.IGG: N-methyl-D-Aspartate Recpt.IgG: NEGATIVE

## 2022-12-03 MED ORDER — POLYETHYLENE GLYCOL 3350 17 G PO PACK
17.0000 g | PACK | Freq: Two times a day (BID) | ORAL | Status: DC
  Administered 2022-12-03 (×2): 17 g
  Filled 2022-12-03 (×2): qty 1

## 2022-12-03 MED ORDER — LORAZEPAM 1 MG PO TABS: 1.0000 mg | ORAL_TABLET | Freq: Three times a day (TID) | ORAL | Status: DC

## 2022-12-03 MED ORDER — RISPERIDONE 0.5 MG PO TABS
1.0000 mg | ORAL_TABLET | Freq: Every day | ORAL | Status: DC
  Administered 2022-12-04 – 2022-12-07 (×4): 1 mg
  Filled 2022-12-03 (×4): qty 2

## 2022-12-03 MED ORDER — SENNOSIDES-DOCUSATE SODIUM 8.6-50 MG PO TABS
1.0000 | ORAL_TABLET | Freq: Two times a day (BID) | ORAL | Status: DC
  Administered 2022-12-03 (×2): 1
  Filled 2022-12-03 (×2): qty 1

## 2022-12-03 MED ORDER — RISPERIDONE 0.5 MG PO TABS
3.0000 mg | ORAL_TABLET | Freq: Every day | ORAL | Status: DC
  Administered 2022-12-03 – 2022-12-06 (×4): 3 mg
  Filled 2022-12-03 (×4): qty 6

## 2022-12-03 MED ORDER — BISACODYL 10 MG RE SUPP
10.0000 mg | Freq: Every day | RECTAL | Status: DC | PRN
  Administered 2022-12-03: 10 mg via RECTAL
  Filled 2022-12-03: qty 1

## 2022-12-03 MED ORDER — CLONAZEPAM 1 MG PO TABS
1.0000 mg | ORAL_TABLET | Freq: Two times a day (BID) | ORAL | Status: DC
  Administered 2022-12-03 (×2): 1 mg
  Filled 2022-12-03 (×2): qty 1

## 2022-12-03 NOTE — Progress Notes (Addendum)
NAME:  Dylan Moon, MRN:  [id number], DOB:  1996/09/12, LOS: 6 ADMISSION DATE:  11/26/2022, CONSULTATION DATE:  11/27/2022 REFERRING MD:  Dr. Gwendlyn Deutscher , CHIEF COMPLAINT: Severe agitation   History of Present Illness:  Dylan Moon is a 27 year old male with past medical history significant for seizures, ADHD, bipolar, schizoaffective disease, scoliosis, and medical noncompliance who presented to the emergency department 3/5 for seizures, felt secondary to medication noncompliance.  Patient was admitted per family medicine teaching service with neurology consultation.  Mid morning of 3/6 patient progressively became more violent and agitated towards staff resulting in security being called, application of violent restraints, and eventually addition of chemical restraints as well. Given need for close monitoring of mentation and airway after chemical restraints PCCM was consulted    Pertinent  Medical History  Seizures, ADHD, bipolar, schizoaffective disease, scoliosis, and medical noncompliance  Significant Hospital Events: Including procedures, antibiotic start and stop dates in addition to other pertinent events   3/5 presented to ED after 1.5-minute tonic-clonic seizure per family 3/6 severely agitated and combative towards staff resulting in both violent and chemical restraints  3/8 continued severe agitation with escalating behavior and requiring multiple sedating medications until last night, calm this morning  3/11 No acute issues overnight, required one dose of versed for agitation   Interim History / Subjective:  Quite agitated this morning prior to my eval. Not redirectable. Not following commands Given '4mg'$  ativan.   Objective   Blood pressure 134/81, pulse 86, temperature 98.1 F (36.7 C), temperature source Oral, resp. rate (!) 25, weight 84.4 kg, SpO2 100 %.        Intake/Output Summary (Last 24 hours) at 12/03/2022 0725 Last data filed at 12/03/2022 0600 Gross per 24  hour  Intake 1764.67 ml  Output 410 ml  Net 1354.67 ml    Filed Weights   11/29/22 0500 11/30/22 0459 12/01/22 0500  Weight: 85.2 kg 86.5 kg 84.4 kg   Physical Exam  General: Young adult male writhing uncomfortably in bed.  HEENT: [place]/AT, PERRL, no JVD Neuro: alternating periods of somnolence and agitation. Nor redirectable or following commands.  CV: RRR, no MRG PULM:  Clear bilateral breath sounds. Good cough. Clearing secretions.  GI: Soft, NT. ND Extremities: warm/dry, no edema.  Skin: no rashes or lesions  Resolved Hospital Problem list   AKI  Assessment & Plan:  Epilepsy with longstanding seizure history, vagal nerve stimulator. P: Seizure precautions  Continue IV Valproic acid and oral Esilcarbazepine per neuro Trend ammonia. Elevated and VPA reduced yesterday as a result.  Phenobarb DC'd Aspirations precautions  MRI pending   History of medication noncompliance Acute psychosis with aggressive behavior -His previous outpatient diagnoses of ADHD and anxiety do not fully explain his presentation, which appears to be schizoaffective disorder, bipolar disorder, or post-ictal psychosis -Severe aggression and agitation has been very difficult to control over the last two days requiring benzos, anti-psychotics, benadryl and ketamine P: Appreciate psychiatry No longer requiring violent restraints, non-violent restraints in place to maintain medical equipment   Continue Clonidine, Ativan, and Risperidone Safety sitter in place  Monitor QTc   At risk malnutrition due to decreased oral intake  P: Continue TFs  Best Practice (right click and "Reselect all SmartList Selections" daily)   Diet/type: NPO until mental status improves DVT prophylaxis: LMWH GI prophylaxis: N/A Lines: N/A Foley:  External cath in place  Code Status:  full code Last date of multidisciplinary goals of care discussion: Family updated at bedside  3/9  Critical care time:  37 minutes    Georgann Housekeeper, AGACNP-BC [facility]  See Amion for personal pager PCCM on call pager [phone] until 7pm. Please call Elink 7p-7a. [phone]  12/03/2022 7:44 AM

## 2022-12-03 NOTE — TOC Progression Note (Addendum)
Transition of Care [facility]) - Initial/Assessment Note    Patient Details  Name: Dylan Moon MRN: [id number] Date of Birth: 1995/11/04  Transition of Care [facility]) CM/SW Contact:    Milinda Antis, [place] Phone Number: 12/03/2022, 10:12 AM  Clinical Narrative:                 08:35- LCSW completed IVC renewal.  TOC following.   Expected Discharge Plan: [facility]     Patient Goals and CMS Choice            Expected Discharge Plan and Services                                              Prior Living Arrangements/Services                       Activities of Daily Living      Permission Sought/Granted                  Emotional Assessment              Admission diagnosis:  Seizure [facility]) [R56.9] Patient Active Problem List   Diagnosis Date Noted   Agitation 12/03/2022   Suicidal behavior with attempted self-injury ([place]) 11/26/2022   Suicidal ideation 11/26/2022   Schizoaffective disorder, bipolar type ([place]) 08/09/2020   Unspecified intellectual disabilities 08/09/2020   Family discord 03/15/2020   Aggressive behavior 03/15/2020   HCAP (healthcare-associated pneumonia) 12/27/2019   Status epilepticus ([place]) 12/24/2019   Acute respiratory failure with hypoxemia (H[facility])    Seizures ([place]) 07/10/2018   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   Noncompliance with medication regimen 12/11/2017   Seizure ([place]) 05/24/2016   Dehydration, mild 05/24/2016   AKI (acute kidney injury) ([place]) 05/24/2016   Marijuana use 05/24/2016   PCP:  [facility], [facility]:   [id number] [place], [id number]: [phone]: [phone]    Social Determinants of Health (SDOH) Social History: SDOH Screenings   Depression (PHQ2-9): Low Risk  (11/13/2021)  Tobacco Use: High Risk (11/27/2022)   SDOH  Interventions:     Readmission Risk Interventions     No data to display

## 2022-12-03 NOTE — Progress Notes (Signed)
   12/03/22 1600  Spiritual Encounters  Type of Visit Initial  Care provided to: Patient;Family  Referral source Family  Reason for visit Routine spiritual support  OnCall Visit No   Ch responded to request for spiritual support. Family was at bedside. Ch provided [employer]. No follow-up needed at this time.

## 2022-12-03 NOTE — Progress Notes (Signed)
[facility] Progress Note Patient Name: ANREW BUCHKO DOB: Feb 21, 1996 MRN: [id number]   Date of Service  12/03/2022  HPI/Events of Note  Patient with prolonged agitated delirium, needs restraints renewed.  eICU Interventions  Restraints renewed.        Kerry Kass Averey Trompeter 12/03/2022, 11:08 PM

## 2022-12-03 NOTE — Consult Note (Signed)
Dylan Moon   Service Date: December 03, 2022 LOS:  LOS: 6 days  Reason for Consult: "suicidal attempt" Consult by: Dylan Dice DO  Assessment  Dylan Moon is a 27 y.o. male with hx of PTSD, ADHD, ?schizoaffective disorder, depression, anxiety, and seizures, admitted medically for 11/26/2022 11:30 AM for seizures.   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 He presented to the hospital for seizures; psychiatry is involved for symptomatic management of agitation which has been quite severe. Overall theme of difficulty balancing agitation and sedation. Generally has improved over past 2-3 days since transfer to ICU and initiation of phenobarbital taper. Medications are limited by [place] (cannot administer previously effective invega). He is currently on risperidone 1 BID (weighing lowering of seizure threshold against ongoing agitation). Mom has given surrogate consent for this with the understanding it will be switched to [facility] when able.   Patien was more obtunded today after 4 mg of versed due to earlier agitation. Has had fluctuations of alertness and agitation, more appropriate at night. No rigidity noted in legs. Switching ativan to clonazepam 1 mg bid for longer acting benzo and increasing PM risperidone to 3 mg to reduce daytime sedation but prevent further agitation.  He is currently under IVC and would recommend continuing it along with 1:1 sitter. At this point given confusion and agitation at least partially due to postictal state, cannot determine if inpt psych will benefit pt. Will reassess this daily. Differential remains the same as yesterday (schizoaffective disorder vs bipolar disorder vs postictal psychosis vs substance induced  psychosis).   Plan  ## Safety and Observation Level:  - Based on my clinical Moon, I estimate the patient to be at high risk of self harm in the current setting. Recommend 1:1 sitter and restraints as needed, following restraint policies and de-escalating when and where possible  - Renewed IVC today 12/03/22  #Severe Agitation #Seizures #Bipolar disorder #hx of PTSD #hx of ADHD Bipolar Disorder vs Postictal psychosis vs schizoaffective disorder vs substance induced psychosis -CHANGE ativan to klonopin 1 mg bid for longer acting benzodiazepene in hopes to reduce sedation but control agitation -INCREASE risperidone to 1 mg in AM and 3 mg in PM -Continue Clonidine 0.1 mg tid -Defer depcaon to neuro and primary   - put in depakote level, was supratherapeutic, neuro adjusted down.  - do not want to start second antipsychotic at this time.   -1:1 sitter Pertinent labs:cardiac monitoring Qtc 390 today, mild increase in Cr 1.2>1.48, CBC wnl  ## Disposition:  -- pending -- reach out to [facility] for urgent appt prior to dc.   Thank you for this consult request. Recommendations have been communicated to the primary team.  We will continue to follow at this time.   Dylan Ravens, MD   Relevant History  Relevant Aspects of Hospital Course:  Admitted on 11/26/2022 for seizures 3/6 rapid called due to severe agitation. Admitted to ICU and started on phenobarbital.  Patient Report:  Pt snoring and asleep. I moved legs to assess for rigidity and patient did not wake up to this. Was able to state name but not much more beyond that. Was out of violent restraints. Did not further wake up as felt it would be harmful to pt (tends to become agitated when woken  up disoriented, perpetuating delirium-->agitation-->PRN-->sedation cycle).   Collateral information:  None today 3/10  Spoke with girlfriend in room; answered questions.   Spoke to mother who asked several times for invega shot, explained  this is not possible (need to prove oral tolerability/efficacy) but thanked her for advocating.   Psychiatric History:  Never been psychiatrically hospitalized. He did establish care with Dr. Hassell Done at [facility] who diagnosed patient with Cannabis use disorder, GAD w/ panic attacks, and PTSD and he was started on mirtazapine. Sleep cycling not disclosed at that appt.    Social History:  Tobacco use: 3 black and mild per day Alcohol use: denies Drug use: Vape THC and smokes 3-4 joints of marijuana per day  Family History:  The patient's family history includes Cancer in an other family member; Diabetes in his father and mother; Hypertension in his mother; Seizures in his maternal grandfather.  Medical History: Past Medical History:  Diagnosis Date   ADHD    Bipolar 1 disorder ([place])    Schizoaffective disorder ([place])    Scoliosis    Seizures ([place])    most recent 12/02/17    Surgical History: Past Surgical History:  Procedure Laterality Date   NO PAST SURGERIES      Medications:   Current Facility-Administered Medications:    acetaminophen (TYLENOL) tablet 650 mg, 650 mg, Oral, Q4H PRN, Noemi Chapel P, DO   Chlorhexidine Gluconate Cloth 2 % PADS 6 each, 6 each, Topical, Daily, Olalere, Adewale A, MD, 6 each at 12/02/22 1353   cloNIDine (CATAPRES) tablet 0.1 mg, 0.1 mg, Per Tube, TID, Omar Person, NP, 0.1 mg at 12/02/22 2136   diphenhydrAMINE (BENADRYL) injection 50 mg, 50 mg, Intravenous, Q6H PRN, Bowser, Grace E, NP, 50 mg at 12/03/22 0702   docusate (COLACE) 50 MG/5ML liquid 100 mg, 100 mg, Per Tube, BID, Eubanks, Katalina M, NP, 100 mg at 12/02/22 2136   enoxaparin (LOVENOX) injection 40 mg, 40 mg, Subcutaneous, Daily, Julian Hy, DO, 40 mg at [id number]   Eslicarbazepine Acetate TABS 1,600 mg, 1,600 mg, Per Tube, Daily, Olalere, Adewale A, MD, 1,600 mg at 12/02/22 1122   feeding supplement (OSMOLITE 1.5 CAL) liquid 1,000 mL, 1,000 mL, Per Tube,  Continuous, Olalere, Adewale A, MD, Last Rate: 65 mL/hr at 12/03/22 0615, 1,000 mL at 12/03/22 0615   feeding supplement (PROSource TF20) liquid 60 mL, 60 mL, Per Tube, Daily, Olalere, Adewale A, MD, 60 mL at 12/02/22 0906   lactulose (CHRONULAC) 10 GM/15ML solution 30 g, 30 g, Per Tube, TID, Anders Simmonds, MD, 30 g at 12/02/22 2136   LORazepam (ATIVAN) tablet 2 mg, 2 mg, Per Tube, BID, 2 mg at 12/02/22 2136 **OR** [DISCONTINUED] LORazepam (ATIVAN) injection 2 mg, 2 mg, Intravenous, Q8H, Olalere, Adewale A, MD, 2 mg at 12/01/22 0614   midazolam (VERSED) injection 2-4 mg, 2-4 mg, Intravenous, Q1H PRN, 2 mg at 12/03/22 0729 **OR** midazolam (VERSED) injection 2-4 mg, 2-4 mg, Intramuscular, Q1H PRN, Noemi Chapel P, DO, 2 mg at 12/01/22 0813   Oral care mouth rinse, 15 mL, Mouth Rinse, 4 times per day, Olalere, Adewale A, MD, 15 mL at 12/02/22 2212   Oral care mouth rinse, 15 mL, Mouth Rinse, PRN, Olalere, Adewale A, MD   polyethylene glycol (MIRALAX / GLYCOLAX) packet 17 g, 17 g, Oral, Daily PRN, Noemi Chapel P, DO, 17 g at 12/01/22 1249   risperiDONE (RISPERDAL) tablet 2 mg, 2 mg, Per Tube, QHS, 2 mg at 12/02/22 2357 **  AND** risperiDONE (RISPERDAL) tablet 1 mg, 1 mg, Per Tube, Daily, Dylan Ravens, MD   thiamine (VITAMIN B1) tablet 100 mg, 100 mg, Per Tube, Daily, Eubanks, Katalina M, NP, 100 mg at 12/02/22 [id number]   valproate (DEPACON) 250 mg in dextrose 5 % 50 mL IVPB, 250 mg, Intravenous, Daily, Camelia Phenes, MD, Last Rate: 52.5 mL/hr at 12/03/22 0600, Infusion Verify at 12/03/22 0600   valproate (DEPACON) 500 mg in dextrose 5 % 50 mL IVPB, 500 mg, Intravenous, BID, Camelia Phenes, MD, Stopped at 12/02/22 2243   white petrolatum (VASELINE) gel, , Topical, PRN, Julian Hy, DO  Allergies: Allergies  Allergen Reactions   Keppra [Levetiracetam] Other (See Comments)    Irritability    Tramadol Other (See Comments)    Contraindicated with current medications (??)   Vimpat [Lacosamide] Other (See  Comments)    Causes anger       Objective  Vital signs:  Temp:  [97.6 F (36.4 C)-99.3 F (37.4 C)] 99.3 F (37.4 C) (03/12 0757) Pulse Rate:  [71-92] 83 (03/12 0800) Resp:  [14-27] 24 (03/12 0800) BP: (103-144)/(61-128) 124/74 (03/12 0800) SpO2:  [85 %-100 %] 92 % (03/12 0800) Weight:  [84.4 kg] 84.4 kg (03/12 0500)  Psychiatric Specialty Exam:  Presentation  General Appearance: Disheveled Eye Contact: minimal  Speech: stated name, slurred Speech Volume: n/a  Mood and Affect  Mood: did not assess Affect: somnolent  Thought Process  Thought Processes: unable to assess Descriptions of Associations: unable to assess Orientation: not assessed Thought Content:unable to assess Hallucinations: unable to assess Ideas of Reference: unable to assess Suicidal Thoughts: unable to assess Homicidal Thoughts: unable to assess  Sensorium  Memory:unable to assess Judgment: unable to assess Insight: unable to assess  Executive Functions  Concentration: unable to assess Attention Span:unable to assess Recall:unable to assess Fund of Knowledge: unable to assess Language: unable to assess  Psychomotor Activity  Psychomotor Activity: decreased   Sleep  Sleep: oversedated  Physical exam: No rigidity or coghweeling in b/l l/e.  ROS: unable to assess, mental status change

## 2022-12-03 NOTE — Consult Note (Addendum)
Subjective: Appears obtunded this morning - groans occasionally and mumbles unintelligibly. Per nursing staff and bedside sitter, patient was trying to get out of bed this morning. Recently received lorazepam and midazolam. Neuro exam was limited.  Objective: Current vital signs: BP 124/74   Pulse 83   Temp 99.3 F (37.4 C)   Resp (!) 24   Wt 84.4 kg   SpO2 92%   BMI 25.95 kg/m  Vital signs in last 24 hours: Temp:  [97.6 F (36.4 C)-99.3 F (37.4 C)] 99.3 F (37.4 C) (03/12 0757) Pulse Rate:  [71-92] 83 (03/12 0800) Resp:  [14-27] 24 (03/12 0800) BP: (103-144)/(61-128) 124/74 (03/12 0800) SpO2:  [85 %-100 %] 92 % (03/12 0800) Weight:  [84.4 kg] 84.4 kg (03/12 0500)  Intake/Output from previous day: 03/11 0701 - 03/12 0700 In: 1764.7 [NG/GT:1560; IV Piggyback:204.7] Out: 410 [Urine:410] Intake/Output this shift: No intake/output data recorded. Nutritional status:  Diet Order     None      General: obtunded HEENT: normocephalic and atraumatic Respiratory: non-labored breathing and on nasal cannula Extremities: moving all extremities spontaneously   Neurologic Exam: Mental Status: Dylan Moon is obtunded; he is not oriented to self, place, time, or situation. He was unable to follow any commands.  Cranial Nerves: II:  Visual fields  unable to assess ; pupils equal, round, reactive to light and accommodation III,IV, VI: no ptosis, unable to assess extra-ocular motions V,VII: smile unable to assess, facial light touch sensation unable to assess IX,X: uvula unable to assess XI: shoulder unable to assess XII: unable to assess  Motor: Unable to assess strength throughout extremities  Tone and bulk: normal tone throughout; no atrophy noted  Sensory: unable to assess  Cerebellar: Finger-to-nose test not assessed during this encounter, heel-to-shin test not assessed during this encounter  Gait: not observed during encounter  Lab Results: Results for orders  placed or performed during the hospital encounter of 11/26/22 (from the past 48 hour(s))  Glucose, capillary     Status: None   Collection Time: 12/01/22 11:30 AM  Result Value Ref Range   Glucose-Capillary 98 70 - 99 mg/dL    Comment: Glucose reference range applies only to samples taken after fasting for at least 8 hours.  Ammonia     Status: Abnormal   Collection Time: 12/01/22 12:22 PM  Result Value Ref Range   Ammonia 61 (H) 9 - 35 umol/L    Comment: Performed at [facility] [address], [place], [place] [address]  Sedimentation rate     Status: None   Collection Time: 12/01/22 12:22 PM  Result Value Ref Range   Sed Rate 8 0 - 16 mm/hr    Comment: Performed at [facility] [address], [place], [place] [address]  C-reactive protein     Status: Abnormal   Collection Time: 12/01/22 12:22 PM  Result Value Ref Range   CRP 2.4 (H) <1.0 mg/dL    Comment: Performed at [facility], [place] [address] [place], [place] [address]  Glucose, capillary     Status: Abnormal   Collection Time: 12/01/22  3:34 PM  Result Value Ref Range   Glucose-Capillary 126 (H) 70 - 99 mg/dL    Comment: Glucose reference range applies only to samples taken after fasting for at least 8 hours.  Glucose, capillary     Status: Abnormal   Collection Time: 12/01/22  7:54 PM  Result Value Ref Range   Glucose-Capillary 125 (H)  70 - 99 mg/dL    Comment: Glucose reference range applies only to samples taken after fasting for at least 8 hours.  Glucose, capillary     Status: Abnormal   Collection Time: 12/01/22 11:29 PM  Result Value Ref Range   Glucose-Capillary 123 (H) 70 - 99 mg/dL    Comment: Glucose reference range applies only to samples taken after fasting for at least 8 hours.  Basic metabolic panel     Status: Abnormal   Collection Time: 12/02/22  1:13 AM  Result Value Ref Range   Sodium 139 135 - 145 mmol/L   Potassium 4.1 3.5 - 5.1 mmol/L   Chloride 105 98 - 111 mmol/L   CO2  21 (L) 22 - 32 mmol/L   Glucose, Bld 139 (H) 70 - 99 mg/dL    Comment: Glucose reference range applies only to samples taken after fasting for at least 8 hours.   BUN 10 6 - 20 mg/dL   Creatinine, Ser 0.85 0.61 - 1.24 mg/dL   Calcium 9.2 8.9 - 10.3 mg/dL   GFR, Estimated >60 >60 mL/min    Comment: (NOTE) Calculated using the CKD-EPI Creatinine Equation (2021)    Anion gap 13 5 - 15    Comment: Performed at [facility] [address], [place] [address]  CBC     Status: None   Collection Time: 12/02/22  1:13 AM  Result Value Ref Range   WBC 6.6 4.0 - 10.5 K/uL   RBC 4.87 4.22 - 5.81 MIL/uL   Hemoglobin 13.8 13.0 - 17.0 g/dL   HCT 40.7 39.0 - 52.0 %   MCV 83.6 80.0 - 100.0 fL   MCH 28.3 26.0 - 34.0 pg   MCHC 33.9 30.0 - 36.0 g/dL   RDW 13.2 11.5 - 15.5 %   Platelets 238 150 - 400 K/uL   nRBC 0.0 0.0 - 0.2 %    Comment: Performed at [facility], [place] [address] [place], [place] [address]  Magnesium     Status: None   Collection Time: 12/02/22  1:13 AM  Result Value Ref Range   Magnesium 2.1 1.7 - 2.4 mg/dL    Comment: Performed at [facility] [address], [place], [place] [address]  Phosphorus     Status: None   Collection Time: 12/02/22  1:13 AM  Result Value Ref Range   Phosphorus 4.2 2.5 - 4.6 mg/dL    Comment: Performed at [facility] [address], [place], [place] [address]  CK     Status: Abnormal   Collection Time: 12/02/22  1:13 AM  Result Value Ref Range   Total CK 1,033 (H) 49 - 397 U/L    Comment: Performed at [facility], [place] [address], [place], [place] [address]  Ammonia     Status: Abnormal   Collection Time: 12/02/22  1:13 AM  Result Value Ref Range   Ammonia 66 (H) 9 - 35 umol/L    Comment: Performed at [facility], [place] [address] [place], [place] [address]  Glucose, capillary     Status: Abnormal   Collection Time: 12/02/22  3:43 AM  Result Value Ref Range   Glucose-Capillary 109 (H) 70 - 99  mg/dL    Comment: Glucose reference range applies only to samples taken after fasting for at least 8 hours.  Glucose, capillary     Status: None   Collection Time: 12/02/22  8:02  AM  Result Value Ref Range   Glucose-Capillary 95 70 - 99 mg/dL    Comment: Glucose reference range applies only to samples taken after fasting for at least 8 hours.  Glucose, capillary     Status: None   Collection Time: 12/02/22 11:53 AM  Result Value Ref Range   Glucose-Capillary 98 70 - 99 mg/dL    Comment: Glucose reference range applies only to samples taken after fasting for at least 8 hours.  Glucose, capillary     Status: Abnormal   Collection Time: 12/02/22  3:42 PM  Result Value Ref Range   Glucose-Capillary 126 (H) 70 - 99 mg/dL    Comment: Glucose reference range applies only to samples taken after fasting for at least 8 hours.  Glucose, capillary     Status: Abnormal   Collection Time: 12/02/22  7:19 PM  Result Value Ref Range   Glucose-Capillary 121 (H) 70 - 99 mg/dL    Comment: Glucose reference range applies only to samples taken after fasting for at least 8 hours.  Glucose, capillary     Status: Abnormal   Collection Time: 12/02/22 11:06 PM  Result Value Ref Range   Glucose-Capillary 100 (H) 70 - 99 mg/dL    Comment: Glucose reference range applies only to samples taken after fasting for at least 8 hours.  Basic metabolic panel     Status: Abnormal   Collection Time: 12/03/22  2:45 AM  Result Value Ref Range   Sodium 135 135 - 145 mmol/L   Potassium 4.3 3.5 - 5.1 mmol/L   Chloride 101 98 - 111 mmol/L   CO2 25 22 - 32 mmol/L   Glucose, Bld 125 (H) 70 - 99 mg/dL    Comment: Glucose reference range applies only to samples taken after fasting for at least 8 hours.   BUN 10 6 - 20 mg/dL   Creatinine, Ser 0.80 0.61 - 1.24 mg/dL   Calcium 9.0 8.9 - 10.3 mg/dL   GFR, Estimated >60 >60 mL/min    Comment: (NOTE) Calculated using the CKD-EPI Creatinine Equation (2021)    Anion gap 9 5 - 15     Comment: Performed at [facility] [address], [place] [address]  CBC     Status: None   Collection Time: 12/03/22  2:45 AM  Result Value Ref Range   WBC 6.5 4.0 - 10.5 K/uL   RBC 5.11 4.22 - 5.81 MIL/uL   Hemoglobin 14.2 13.0 - 17.0 g/dL   HCT 43.9 39.0 - 52.0 %   MCV 85.9 80.0 - 100.0 fL   MCH 27.8 26.0 - 34.0 pg   MCHC 32.3 30.0 - 36.0 g/dL   RDW 13.2 11.5 - 15.5 %   Platelets 263 150 - 400 K/uL   nRBC 0.0 0.0 - 0.2 %    Comment: Performed at [facility], [place] [address], [place], [place] [address]  Magnesium     Status: None   Collection Time: 12/03/22  2:45 AM  Result Value Ref Range   Magnesium 2.4 1.7 - 2.4 mg/dL    Comment: Performed at [facility] [address], [place], [place] [address]  Phosphorus     Status: Abnormal   Collection Time: 12/03/22  2:45 AM  Result Value Ref Range   Phosphorus 5.4 (H) 2.5 - 4.6 mg/dL    Comment: Performed at [facility] [address] [place], [place] [address]  Ammonia  Status: Abnormal   Collection Time: 12/03/22  2:45 AM  Result Value Ref Range   Ammonia 67 (H) 9 - 35 umol/L    Comment: Performed at [facility], [place] [address], [place], [place] [address]  Phenobarbital level     Status: Abnormal   Collection Time: 12/03/22  2:45 AM  Result Value Ref Range   Phenobarbital 56.6 (H) 15.0 - 40.0 ug/mL    Comment: Performed at [facility] [address], [place], [place] [address]  Valproic acid level     Status: None   Collection Time: 12/03/22  2:45 AM  Result Value Ref Range   Valproic Acid Lvl 85 50.0 - 100.0 ug/mL    Comment: Performed at [facility] [address] [place], [place] [address]  Glucose, capillary     Status: None   Collection Time: 12/03/22  3:37 AM  Result Value Ref Range   Glucose-Capillary 83 70 - 99 mg/dL    Comment: Glucose reference range applies only to samples taken after fasting for at least 8 hours.  Glucose, capillary      Status: None   Collection Time: 12/03/22  7:56 AM  Result Value Ref Range   Glucose-Capillary 84 70 - 99 mg/dL    Comment: Glucose reference range applies only to samples taken after fasting for at least 8 hours.    Recent Results (from the past 240 hour(s))  MRSA Next Gen by PCR, Nasal     Status: None   Collection Time: 11/28/22  9:50 PM   Specimen: Nasal Mucosa; Nasal Swab  Result Value Ref Range Status   MRSA by PCR Next Gen NOT DETECTED NOT DETECTED Final    Comment: (NOTE) The GeneXpert MRSA Assay (FDA approved for NASAL specimens only), is one component of a comprehensive MRSA colonization surveillance program. It is not intended to diagnose MRSA infection nor to guide or monitor treatment for MRSA infections. Test performance is not FDA approved in patients less than 28 years old. Performed at [facility], [place] [address], [place], [place] [address]     Lipid Panel No results for input(s): "CHOL", "TRIG", "HDL", "CHOLHDL", "VLDL", "LDLCALC" in the last 72 hours.  Studies/Results: DG Abd 1 View  Result Date: 12/03/2022 CLINICAL DATA:  Evaluate NG tube placement. EXAM: ABDOMEN - 1 VIEW COMPARISON:  11/29/22. FINDINGS: Again seen is a feeding tube with tip in the right hemiabdomen within the expected location of the proximal duodenum. No nasogastric tube identified. Mild diffuse gaseous distension of the large and small bowel loops is again noted. Compared with the previous exam the small bowel loops appear decreased in caliber. IMPRESSION: 1. Feeding tube tip is in the right hemiabdomen in the expected location of the proximal duodenum. No nasogastric tube identified. 2. Persistent gaseous distension of the large and small bowel loops with decreased caliber of the small bowel. Electronically Signed   By: Kerby Moors M.D.   On: 12/03/2022 07:35    Medications: Scheduled:  Chlorhexidine Gluconate Cloth  6 each Topical Daily   cloNIDine  0.1 mg Per Tube TID   docusate   100 mg Per Tube BID   enoxaparin (LOVENOX) injection  40 mg Subcutaneous Daily   Eslicarbazepine Acetate  1,600 mg Per Tube Daily   feeding supplement (PROSource TF20)  60 mL Per Tube Daily   lactulose  30 g Per Tube TID   LORazepam  2 mg Per Tube BID   mouth  rinse  15 mL Mouth Rinse 4 times per day   risperiDONE  2 mg Per Tube QHS   And   risperiDONE  1 mg Per Tube Daily   thiamine  100 mg Per Tube Daily   Continuous:  feeding supplement (OSMOLITE 1.5 CAL) 1,000 mL (12/03/22 0615)   valproate sodium 52.5 mL/hr at 12/03/22 0600   valproate sodium Stopped (12/02/22 2243)    Assessment: 27 year old male with 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 Has been admitted previously for similar clinical picture. 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   Exam this morning reveals an obtunded patient who is does not respond to voice and appears grossly disoriented, not following any commands, and in mechanical restraints. I did not attempt to stimulate him too vigorously because nursing staff said they have had trouble getting him calm enough to stay in bed. No seizure-like activity noted. Patient currently on AEDs   Recommendations: - Anticonvulsant regimen:  - valproic acid 250 mg in AM and 500 mg in afternoon and 500 in evening - Continue his home eslicarbazepine   - Phenobarbital discontinued. Given prolonged halflife, would gradually clear from his body. - Ativan PRN seizure recurrence. Can also be used for agitation - Psychiatry is consulting and managing his antipsychotic regimen. Appreciate their  recommendations towards titrating the patient towards a stable, non-agitated and preferably non-sedated state.  - Continue to monitor and manage agitation: Appreciate Psychiatry input.  - Anti-NMDA receptor autoantibody serum level has been ordered, results pending - Given concern for possible autoimmune encephalopathy, would obtain MRI brain w/wo contrast. Will need to be sedated for MRI. Compare for stability relative to prior MRI from 2021.  - ESR 8, CRP 2.4 - VPA level 85, phenobarbital level 56.5 - ammonia level 67, continues to be on lactulose   LOS: 6 days   Camelia Phenes, MD [place] Intern (PGY-1) 12/03/2022 8:23 AM  NEUROHOSPITALIST ADDENDUM Performed a face to face diagnostic evaluation.   I have reviewed the contents of history and physical exam as documented by PA/ARNP/Resident and agree with above documentation.  I have discussed and formulated the above plan as documented. Edits to the note have been made as needed.  Impression/Key exam findings/Plan: VPA levels are normal now. Ammonia stable at 67. Hopefully will trend down tomorrow. Was very agitated in AM and had to be sedated with Versed and Ativan. Now significantly somnolent.  We will continue to follow.  Donnetta Simpers, MD [facility] [id number]   If 7pm to 7am, please call on call as listed on AMION.

## 2022-12-04 ENCOUNTER — Inpatient Hospital Stay (HOSPITAL_COMMUNITY): Payer: Medicaid Other

## 2022-12-04 DIAGNOSIS — R569 Unspecified convulsions: Secondary | ICD-10-CM | POA: Diagnosis not present

## 2022-12-04 DIAGNOSIS — F319 Bipolar disorder, unspecified: Secondary | ICD-10-CM | POA: Clinically undetermined

## 2022-12-04 LAB — CBC
HCT: 45.1 % (ref 39.0–52.0)
Hemoglobin: 15.2 g/dL (ref 13.0–17.0)
MCH: 28.5 pg (ref 26.0–34.0)
MCHC: 33.7 g/dL (ref 30.0–36.0)
MCV: 84.6 fL (ref 80.0–100.0)
Platelets: 285 10*3/uL (ref 150–400)
RBC: 5.33 MIL/uL (ref 4.22–5.81)
RDW: 13.2 % (ref 11.5–15.5)
WBC: 8.4 10*3/uL (ref 4.0–10.5)
nRBC: 0 % (ref 0.0–0.2)

## 2022-12-04 LAB — BASIC METABOLIC PANEL
Anion gap: 11 (ref 5–15)
BUN: 11 mg/dL (ref 6–20)
CO2: 26 mmol/L (ref 22–32)
Calcium: 9.4 mg/dL (ref 8.9–10.3)
Chloride: 98 mmol/L (ref 98–111)
Creatinine, Ser: 1.03 mg/dL (ref 0.61–1.24)
GFR, Estimated: >60 mL/min (ref >60)
Glucose, Bld: 84 mg/dL (ref 70–99)
Potassium: 5.2 mmol/L — ABNORMAL HIGH (ref 3.5–5.1)
Sodium: 135 mmol/L (ref 135–145)

## 2022-12-04 LAB — GLUCOSE, CAPILLARY
Glucose-Capillary: 89 mg/dL (ref 70–99)
Glucose-Capillary: 130 mg/dL — ABNORMAL HIGH (ref 70–99)
Glucose-Capillary: 100 mg/dL — ABNORMAL HIGH (ref 70–99)
Glucose-Capillary: 107 mg/dL — ABNORMAL HIGH (ref 70–99)
Glucose-Capillary: 175 mg/dL — ABNORMAL HIGH (ref 70–99)

## 2022-12-04 LAB — AMMONIA: Ammonia: 42 umol/L — ABNORMAL HIGH (ref 9–35)

## 2022-12-04 LAB — MAGNESIUM: Magnesium: 2.2 mg/dL (ref 1.7–2.4)

## 2022-12-04 LAB — PHOSPHORUS: Phosphorus: 5.2 mg/dL — ABNORMAL HIGH (ref 2.5–4.6)

## 2022-12-04 MED ORDER — DEXMEDETOMIDINE HCL IN NACL 400 MCG/100ML IV SOLN
0.0000 ug/kg/h | INTRAVENOUS | Status: DC
  Administered 2022-12-04 (×2): 0.4 ug/kg/h via INTRAVENOUS
  Administered 2022-12-05: 0.6 ug/kg/h via INTRAVENOUS
  Administered 2022-12-05: 0.9 ug/kg/h via INTRAVENOUS
  Administered 2022-12-05 – 2022-12-06 (×2): 0.3 ug/kg/h via INTRAVENOUS
  Filled 2022-12-04 (×6): qty 100

## 2022-12-04 MED ORDER — GADOBUTROL 1 MMOL/ML IV SOLN
8.0000 mL | Freq: Once | INTRAVENOUS | Status: AC | PRN
  Administered 2022-12-04: 8 mL via INTRAVENOUS

## 2022-12-04 MED ORDER — DIATRIZOATE MEGLUMINE & SODIUM 66-10 % PO SOLN
ORAL | Status: AC
  Administered 2022-12-04: 15 mL via NASOGASTRIC
  Filled 2022-12-04: qty 30

## 2022-12-04 MED ORDER — MIDAZOLAM HCL 2 MG/2ML IJ SOLN
4.0000 mg | Freq: Once | INTRAMUSCULAR | Status: AC
  Administered 2022-12-04: 4 mg via INTRAVENOUS
  Filled 2022-12-04: qty 4

## 2022-12-04 MED ORDER — CLONAZEPAM 1 MG PO TABS
2.0000 mg | ORAL_TABLET | Freq: Two times a day (BID) | ORAL | Status: DC
  Administered 2022-12-04 – 2022-12-06 (×5): 2 mg
  Filled 2022-12-04 (×5): qty 2

## 2022-12-04 MED ORDER — DEXMEDETOMIDINE HCL IN NACL 400 MCG/100ML IV SOLN: 0.0000 ug/kg/h | INTRAVENOUS | Status: DC

## 2022-12-04 NOTE — Care Plan (Addendum)
VNA was interrogated as follows    Normal [employer] Magnet  Output 1.41m 1.6232m1.7533mFrequency '20Hz'$     Pule Width 250usec 250usec 250usec  On time 30 sec 30 sec 30 sec  Off time 3 min    Duty cycle 16%     VNS was turned off on 12/04/2022 at 1615.    Normal [employer] Magnet  Output  0mA59mA 71m  32mquency '20Hz'$     Pule Width 250usec 250usec 250usec  On time 30 sec 30 sec 30 sec  Off time 3 min    Duty cycle 16%      Jaala Bohle O YadaBarbra Sarks

## 2022-12-04 NOTE — Consult Note (Signed)
[facility] Face-to-Face Psychiatric Evaluation   Service Date: December 04, 2022 LOS:  LOS: 7 days  Reason for Consult: "suicidal attempt" Consult by: Donney Dice DO  Assessment  Dylan Moon is a 27 y.o. male with hx of PTSD, ADHD, ?schizoaffective disorder, depression, anxiety, and seizures, admitted medically for 11/26/2022 11:30 AM for seizures.   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 He presented to the hospital for seizures; psychiatry is involved for symptomatic management of agitation which has been quite severe. Overall theme of difficulty balancing agitation and sedation. Generally has improved over past 2-3 days since transfer to ICU and initiation of phenobarbital taper. Medications are limited by Coretrak (cannot administer previously effective invega). He is currently on risperidone 1 BID (weighing lowering of seizure threshold against ongoing agitation). Mom has given surrogate consent for this with the understanding it will be switched to [facility] when able.   Patien was somnolent on assessment. Was agitated throughout night per nursing. Patient finally slept this AM so did not wake up. Patient's depakene was increased to 500 mg tid after consulting with neurology to aid with seizures and depakote. Agitation seems worse at night and family also having difficulty reorienting patient when he wakes up.   He is currently under IVC and would recommend continuing it along with 1:1 sitter. At this point given confusion and agitation at least partially due to postictal state, cannot determine if inpt psych will benefit pt. Will reassess this daily. Differential remains the same as yesterday (schizoaffective disorder vs bipolar disorder vs postictal psychosis vs substance  induced psychosis).   Plan  ## Safety and Observation Level:  - Based on my clinical evaluation, I estimate the patient to be at high risk of self harm in the current setting. Recommend 1:1 sitter and restraints as needed, following restraint policies and de-escalating when and where possible  - Renewed IVC today 12/03/22  #Severe Agitation #Seizures #Bipolar disorder #hx of PTSD #hx of ADHD Bipolar Disorder vs Postictal psychosis vs schizoaffective disorder vs substance induced psychosis -Clonazepam increased to 2 mg bid anxiety   -Could consider librium for even longer half life benzodiazepine  -Continue risperidone to 1 mg in AM and 3 mg in PM -Continue Clonidine 0.1 mg tid -Defer depcaon to neuro and primary   - put in depakote level, was supratherapeutic, neuro adjusted down.  - do not want to start second antipsychotic at this time.   -1:1 sitter Pertinent labs:cardiac monitoring Qtc 390 today, mild increase in Cr 1.2>1.48, CBC wnl  ## Disposition:  -- pending -- reach out to [facility] for urgent appt prior to dc.   Thank you for this consult request. Recommendations have been communicated to the primary team.  We will continue to follow at this time.   France Ravens, MD   Relevant History  Relevant Aspects of Hospital Course:  Admitted on 11/26/2022 for seizures 3/6 rapid called due to severe agitation. Admitted to ICU and started on phenobarbital.  Patient Report:  Pt snoring and asleep. Was out of violent restraints. Did not further wake up as felt it would be harmful to pt (tends to become agitated when woken up disoriented, perpetuating delirium-->agitation-->PRN-->sedation cycle).   Collateral information:  Spoke to mother today over phone. I explained to her current plan to increase depacon again  to aid with seizures and mood stability. She verbalized understanding and agreement. She asked regarding invega LAI again today and I discussed this would only be option AFTER he  has stabilized on an oral equivalent so we know what is an appropriate dose for him. She verbalized understanding. All questions addressed.  Psychiatric History:  Never been psychiatrically hospitalized. He did establish care with Dr. Hassell Done at [facility] who diagnosed patient with Cannabis use disorder, GAD w/ panic attacks, and PTSD and he was started on mirtazapine. Sleep cycling not disclosed at that appt.    Social History:  Tobacco use: 3 black and mild per day Alcohol use: denies Drug use: Vape THC and smokes 3-4 joints of marijuana per day  Family History:  The patient's family history includes Cancer in an other family member; Diabetes in his father and mother; Hypertension in his mother; Seizures in his maternal grandfather.  Medical History: Past Medical History:  Diagnosis Date   ADHD    Bipolar 1 disorder ([place])    Schizoaffective disorder ([place])    Scoliosis    Seizures ([place])    most recent 12/02/17    Surgical History: Past Surgical History:  Procedure Laterality Date   NO PAST SURGERIES      Medications:   Current Facility-Administered Medications:    acetaminophen (TYLENOL) tablet 650 mg, 650 mg, Oral, Q4H PRN, Noemi Chapel P, DO   bisacodyl (DULCOLAX) suppository 10 mg, 10 mg, Rectal, Daily PRN, Corey Harold, NP, 10 mg at 12/03/22 1630   Chlorhexidine Gluconate Cloth 2 % PADS 6 each, 6 each, Topical, Daily, Olalere, Adewale A, MD, 6 each at 12/04/22 0913   clonazePAM (KLONOPIN) tablet 2 mg, 2 mg, Per Tube, Q12H, Corey Harold, NP, 2 mg at 12/04/22 [id number]   cloNIDine (CATAPRES) tablet 0.1 mg, 0.1 mg, Per Tube, TID, Omar Person, NP, 0.1 mg at 12/04/22 0835   dexmedetomidine (PRECEDEX) 400 MCG/100ML (4 mcg/mL) infusion, 0-1.2 mcg/kg/hr, Intravenous, Titrated, Freda Jackson B, MD, Last Rate: 8.58 mL/hr at 12/04/22 1251, 0.4 mcg/kg/hr at 12/04/22 1251   diphenhydrAMINE (BENADRYL) injection 50 mg, 50 mg, Intravenous, Q6H PRN, Bowser, Grace  E, NP, 50 mg at 12/04/22 0445   enoxaparin (LOVENOX) injection 40 mg, 40 mg, Subcutaneous, Daily, Noemi Chapel P, DO, 40 mg at [id number]   Eslicarbazepine Acetate TABS 1,600 mg, 1,600 mg, Per Tube, Daily, Olalere, Adewale A, MD, 1,600 mg at 12/04/22 1050   feeding supplement (OSMOLITE 1.5 CAL) liquid 1,000 mL, 1,000 mL, Per Tube, Continuous, Olalere, Adewale A, MD, Last Rate: 65 mL/hr at 12/04/22 1200, Infusion Verify at 12/04/22 1200   feeding supplement (PROSource TF20) liquid 60 mL, 60 mL, Per Tube, Daily, Olalere, Adewale A, MD, 60 mL at 12/04/22 0836   midazolam (VERSED) injection 4 mg, 4 mg, Intravenous, Once, Freddi Starr, MD   Oral care mouth rinse, 15 mL, Mouth Rinse, 4 times per day, Olalere, Adewale A, MD, 15 mL at 12/04/22 1256   Oral care mouth rinse, 15 mL, Mouth Rinse, PRN, Olalere, Adewale A, MD   risperiDONE (RISPERDAL) tablet 3 mg, 3 mg, Per Tube, QHS, 3 mg at 12/03/22 2103 **AND** risperiDONE (RISPERDAL) tablet 1 mg, 1 mg, Per Tube, Daily, France Ravens, MD, 1 mg at 12/04/22 [id number]   thiamine (VITAMIN B1) tablet 100 mg, 100 mg, Per Tube, Daily, Hayden Pedro M, NP, 100 mg at 12/04/22 0835   valproate (DEPACON) 250 mg in dextrose 5 % 50 mL IVPB, 250 mg,  Intravenous, Daily, Camelia Phenes, MD, Stopped at 12/04/22 0631   valproate (DEPACON) 500 mg in dextrose 5 % 50 mL IVPB, 500 mg, Intravenous, BID, Camelia Phenes, MD, Paused at 12/03/22 2203   white petrolatum (VASELINE) gel, , Topical, PRN, Julian Hy, DO  Allergies: Allergies  Allergen Reactions   Keppra [Levetiracetam] Other (See Comments)    Irritability    Tramadol Other (See Comments)    Contraindicated with current medications (??)   Vimpat [Lacosamide] Other (See Comments)    Causes anger       Objective  Vital signs:  Temp:  [97.9 F (36.6 C)-99.9 F (37.7 C)] 97.9 F (36.6 C) (03/13 1117) Pulse Rate:  [70-92] 79 (03/13 1200) Resp:  [15-26] 20 (03/13 1200) BP: (104-155)/(50-97) 127/79 (03/13  1200) SpO2:  [95 %-99 %] 97 % (03/13 1200) Weight:  [85.8 kg] 85.8 kg (03/13 1117)  Psychiatric Specialty Exam:  Presentation  General Appearance: Disheveled Eye Contact: minimal  Speech: stated name, slurred Speech Volume: n/a  Mood and Affect  Mood: did not assess Affect: somnolent  Thought Process  Thought Processes: unable to assess Descriptions of Associations: unable to assess Orientation: not assessed Thought Content:unable to assess Hallucinations: unable to assess Ideas of Reference: unable to assess Suicidal Thoughts: unable to assess Homicidal Thoughts: unable to assess  Sensorium  Memory:unable to assess Judgment: unable to assess Insight: unable to assess  Executive Functions  Concentration: unable to assess Attention Span:unable to assess Recall:unable to assess Fund of Knowledge: unable to assess Language: unable to assess  Psychomotor Activity  Psychomotor Activity: decreased   Sleep  Sleep: sedated  Physical exam: obtunded but in no acute distress.  ROS: unable to assess, mental status change

## 2022-12-04 NOTE — Progress Notes (Addendum)
NAME:  Dylan Moon, MRN:  [id number], DOB:  July 17, 1996, LOS: 7 ADMISSION DATE:  11/26/2022, CONSULTATION DATE:  11/27/2022 REFERRING MD:  Dr. Gwendlyn Deutscher , CHIEF COMPLAINT: Severe agitation   History of Present Illness:  Dylan Moon is a 27 year old male with past medical history significant for seizures, ADHD, bipolar, schizoaffective disease, scoliosis, and medical noncompliance who presented to the emergency department 3/5 for seizures, felt secondary to medication noncompliance.  Patient was admitted per family medicine teaching service with neurology consultation.  Mid morning of 3/6 patient progressively became more violent and agitated towards staff resulting in security being called, application of violent restraints, and eventually addition of chemical restraints as well. Given need for close monitoring of mentation and airway after chemical restraints PCCM was consulted    Pertinent  Medical History  Seizures, ADHD, bipolar, schizoaffective disease, scoliosis, and medical noncompliance  Significant Hospital Events: Including procedures, antibiotic start and stop dates in addition to other pertinent events   3/5 presented to ED after 1.5-minute tonic-clonic seizure per family 3/6 severely agitated and combative towards staff resulting in both violent and chemical restraints  3/8 continued severe agitation with escalating behavior and requiring multiple sedating medications until last night, calm this morning  3/11 No acute issues overnight, required one dose of versed for agitation   Interim History / Subjective:  Very combative with staff overnight. Kicking, biting staff members. A '10mg'$  Versed in total were given overnight. Precedex was ordered by EMD, but did not need to be started fortunately.   Was able to finally have BM overnight.  Objective   Blood pressure (!) 152/90, pulse 86, temperature 98.2 F (36.8 C), temperature source Axillary, resp. rate 18, weight 84.4 kg, SpO2  98 %.        Intake/Output Summary (Last 24 hours) at 12/04/2022 0720 Last data filed at 12/04/2022 0600 Gross per 24 hour  Intake 708.7 ml  Output 900 ml  Net -191.3 ml    Filed Weights   11/30/22 0459 12/01/22 0500 12/03/22 0500  Weight: 86.5 kg 84.4 kg 84.4 kg   Physical Exam   General: young adult male resting comfortably HEENT: [place]/AT, PERRL, no JVD Neuro: Alternating periods of somnolence and agitation. He is redirectable and following simple commands this morning.  CV: RRR, no MRG PULM:  Clear bilateral breath sounds GI: Soft, NT, ND Extremities: no acute deformity or ROM limitation Skin: grossly intact  Resolved Hospital Problem list   AKI  Assessment & Plan:  Epilepsy with longstanding seizure history, vagal nerve stimulator. P: Seizure precautions  Continue IV Valproic acid and oral Esilcarbazepine per neuro. VPA level reduced due to increased ammonia level, which is trending down.  Phenobarb DC 3/11 while we allow to slowly clear.  Aspiration precautions  MRI pending when he is able to be still for it.  History of medication noncompliance Acute psychosis with aggressive behavior Bipolar disorder PTSD hx ADHD hx -His previous outpatient diagnoses of ADHD and anxiety do not fully explain his presentation, which appears to be schizoaffective disorder, bipolar disorder, or post-ictal psychosis -Severe aggression and agitation has been very difficult to control requiring benzos, anti-psychotics, and benadryl  P: [place] psychiatry consultation.  Continues to require non-violent physical restraint.  Continue Clonidine,  clonazepam, and Risperidone Increase clonazepam to 2 mg BID Safety sitter in place  Monitor QTc - 399 3/13  At risk malnutrition due to decreased oral intake  P: Continue TFs  Best Practice (right click and "Reselect all SmartList Selections"  daily)   Diet/type: NPO until mental status improves - TF DVT prophylaxis: LMWH GI  prophylaxis: N/A Lines: N/A Foley:  External cath in place  Code Status:  full code Last date of multidisciplinary goals of care discussion: Family updated at bedside 3/12  Critical care time:  39 minutes    Georgann Housekeeper, AGACNP-BC [place] for personal pager PCCM on call pager [phone] until 7pm. Please call Elink 7p-7a. [phone]  12/04/2022 7:20 AM

## 2022-12-04 NOTE — Procedures (Signed)
Cortrak  Tube Type:  Cortrak - 43 inches Tube Location:  Left nare Initial Placement:  Postpyloric Secured by: Bridle Technique Used to Measure Tube Placement:  Marking at nare/corner of mouth Cortrak Secured At:  95 cm   Cortrak Tube Team Note:  Consult received to place a Cortrak feeding tube.   X-ray is required, abdominal x-ray has been ordered by the Cortrak team. Please confirm tube placement before using the Cortrak tube.   If the tube becomes dislodged please keep the tube and contact the Cortrak team at [email or web address] for replacement.  If after hours and replacement cannot be delayed, place a NG tube and confirm placement with an abdominal x-ray.    Koleen Distance MS, RD, LDN Please refer to [facility] for RD and/or RD on-call/weekend/after hours pager

## 2022-12-04 NOTE — Progress Notes (Signed)
[facility] Progress Note Patient Name: Dylan Moon DOB: 05-08-1996 MRN: [id number]   Date of Service  12/04/2022  HPI/Events of Note  Patient with extreme agitation despite multiple psychotropic medications including a total of 10 mg of Versed.  eICU Interventions  Will discontinue Versed due to fears of precipitating apnea. Will try Precedex gtt.        Kerry Kass Gennie Eisinger 12/04/2022, 3:04 AM

## 2022-12-04 NOTE — Consult Note (Addendum)
Subjective: Patient asleep this morning. Per sitter and nurse, patient was noted to be awake the whole night and difficult to control as he was trying to get out of bed. Per nursing overnight report, patient was oriented only to self and intermittently followed simple commands to stop trying to get out of bed.  Objective: Current vital signs: BP (!) 152/90   Pulse 86   Temp 98.2 F (36.8 C) (Axillary)   Resp 18   Wt 84.4 kg   SpO2 98%   BMI 25.95 kg/m  Vital signs in last 24 hours: Temp:  [98.1 F (36.7 C)-99.8 F (37.7 C)] 98.2 F (36.8 C) (03/13 0340) Pulse Rate:  [70-101] 86 (03/13 0400) Resp:  [13-25] 18 (03/13 0400) BP: (98-155)/(50-97) 152/90 (03/13 0400) SpO2:  [88 %-100 %] 98 % (03/13 0400)  Intake/Output from previous day: 03/12 0701 - 03/13 0700 In: 708.7 [NG/GT:574.3; IV Piggyback:134.5] Out: 900 [Urine:900] Intake/Output this shift: No intake/output data recorded. Nutritional status:  Diet Order     None      General: obtunded HEENT: normocephalic and atraumatic Respiratory: non-labored breathing and on nasal cannula Extremities: moving all extremities spontaneously   Neurologic Exam: Mental Status: Dylan Moon is asleep. Patient was not asked orientation questions.  Cranial Nerves: Not assessed this AM.  Motor: Tone and bulk: normal tone throughout; no atrophy noted  Sensory: not assessed  Cerebellar: Finger-to-nose test not assessed during this encounter, heel-to-shin test not assessed during this encounter  Gait: not observed during encounter  Lab Results: Results for orders placed or performed during the hospital encounter of 11/26/22 (from the past 48 hour(s))  Glucose, capillary     Status: None   Collection Time: 12/02/22 11:53 AM  Result Value Ref Range   Glucose-Capillary 98 70 - 99 mg/dL    Comment: Glucose reference range applies only to samples taken after fasting for at least 8 hours.  Glucose, capillary     Status:  Abnormal   Collection Time: 12/02/22  3:42 PM  Result Value Ref Range   Glucose-Capillary 126 (H) 70 - 99 mg/dL    Comment: Glucose reference range applies only to samples taken after fasting for at least 8 hours.  Glucose, capillary     Status: Abnormal   Collection Time: 12/02/22  7:19 PM  Result Value Ref Range   Glucose-Capillary 121 (H) 70 - 99 mg/dL    Comment: Glucose reference range applies only to samples taken after fasting for at least 8 hours.  Glucose, capillary     Status: Abnormal   Collection Time: 12/02/22 11:06 PM  Result Value Ref Range   Glucose-Capillary 100 (H) 70 - 99 mg/dL    Comment: Glucose reference range applies only to samples taken after fasting for at least 8 hours.  Basic metabolic panel     Status: Abnormal   Collection Time: 12/03/22  2:45 AM  Result Value Ref Range   Sodium 135 135 - 145 mmol/L   Potassium 4.3 3.5 - 5.1 mmol/L   Chloride 101 98 - 111 mmol/L   CO2 25 22 - 32 mmol/L   Glucose, Bld 125 (H) 70 - 99 mg/dL    Comment: Glucose reference range applies only to samples taken after fasting for at least 8 hours.   BUN 10 6 - 20 mg/dL   Creatinine, Ser 0.80 0.61 - 1.24 mg/dL   Calcium 9.0 8.9 - 10.3 mg/dL   GFR, Estimated >60 >60 mL/min    Comment: (NOTE) Calculated  using the CKD-EPI Creatinine Equation (2021)    Anion gap 9 5 - 15    Comment: Performed at [facility], [place] [address] [place] [address]  CBC     Status: None   Collection Time: 12/03/22  2:45 AM  Result Value Ref Range   WBC 6.5 4.0 - 10.5 K/uL   RBC 5.11 4.22 - 5.81 MIL/uL   Hemoglobin 14.2 13.0 - 17.0 g/dL   HCT 43.9 39.0 - 52.0 %   MCV 85.9 80.0 - 100.0 fL   MCH 27.8 26.0 - 34.0 pg   MCHC 32.3 30.0 - 36.0 g/dL   RDW 13.2 11.5 - 15.5 %   Platelets 263 150 - 400 K/uL   nRBC 0.0 0.0 - 0.2 %    Comment: Performed at [facility], [place] [address] [place], [place] [address]  Magnesium     Status: None   Collection Time: 12/03/22  2:45 AM  Result  Value Ref Range   Magnesium 2.4 1.7 - 2.4 mg/dL    Comment: Performed at [facility] [address], [place], [place] [address]  Phosphorus     Status: Abnormal   Collection Time: 12/03/22  2:45 AM  Result Value Ref Range   Phosphorus 5.4 (H) 2.5 - 4.6 mg/dL    Comment: Performed at [facility] [address] [place], [place] [address]  Ammonia     Status: Abnormal   Collection Time: 12/03/22  2:45 AM  Result Value Ref Range   Ammonia 67 (H) 9 - 35 umol/L    Comment: Performed at [facility], [place] [address], [place], [place] [address]  Phenobarbital level     Status: Abnormal   Collection Time: 12/03/22  2:45 AM  Result Value Ref Range   Phenobarbital 56.6 (H) 15.0 - 40.0 ug/mL    Comment: Performed at [facility] [address] [place], [place] [address]  Valproic acid level     Status: None   Collection Time: 12/03/22  2:45 AM  Result Value Ref Range   Valproic Acid Lvl 85 50.0 - 100.0 ug/mL    Comment: Performed at [facility] [address], [place], [place] [address]  Glucose, capillary     Status: None   Collection Time: 12/03/22  3:37 AM  Result Value Ref Range   Glucose-Capillary 83 70 - 99 mg/dL    Comment: Glucose reference range applies only to samples taken after fasting for at least 8 hours.  Glucose, capillary     Status: None   Collection Time: 12/03/22  7:56 AM  Result Value Ref Range   Glucose-Capillary 84 70 - 99 mg/dL    Comment: Glucose reference range applies only to samples taken after fasting for at least 8 hours.  Glucose, capillary     Status: Abnormal   Collection Time: 12/03/22 11:00 AM  Result Value Ref Range   Glucose-Capillary 112 (H) 70 - 99 mg/dL    Comment: Glucose reference range applies only to samples taken after fasting for at least 8 hours.  Glucose, capillary     Status: Abnormal   Collection Time: 12/03/22  4:01 PM  Result Value Ref Range   Glucose-Capillary 110 (H) 70 - 99 mg/dL    Comment:  Glucose reference range applies only to samples taken after fasting for at least 8 hours.  Glucose, capillary     Status: None   Collection Time:  12/03/22  7:38 PM  Result Value Ref Range   Glucose-Capillary 94 70 - 99 mg/dL    Comment: Glucose reference range applies only to samples taken after fasting for at least 8 hours.  Glucose, capillary     Status: Abnormal   Collection Time: 12/03/22 11:23 PM  Result Value Ref Range   Glucose-Capillary 100 (H) 70 - 99 mg/dL    Comment: Glucose reference range applies only to samples taken after fasting for at least 8 hours.  Magnesium     Status: None   Collection Time: 12/04/22  2:04 AM  Result Value Ref Range   Magnesium 2.2 1.7 - 2.4 mg/dL    Comment: Performed at [facility], [place] [address], [place], [place] [address]  Phosphorus     Status: Abnormal   Collection Time: 12/04/22  2:04 AM  Result Value Ref Range   Phosphorus 5.2 (H) 2.5 - 4.6 mg/dL    Comment: Performed at [facility] [address], [place], [place] [address]  Ammonia     Status: Abnormal   Collection Time: 12/04/22  2:04 AM  Result Value Ref Range   Ammonia 42 (H) 9 - 35 umol/L    Comment: Performed at [facility], [place] [address] [place], [place] [address]  CBC     Status: None   Collection Time: 12/04/22  2:04 AM  Result Value Ref Range   WBC 8.4 4.0 - 10.5 K/uL   RBC 5.33 4.22 - 5.81 MIL/uL   Hemoglobin 15.2 13.0 - 17.0 g/dL   HCT 45.1 39.0 - 52.0 %   MCV 84.6 80.0 - 100.0 fL   MCH 28.5 26.0 - 34.0 pg   MCHC 33.7 30.0 - 36.0 g/dL   RDW 13.2 11.5 - 15.5 %   Platelets 285 150 - 400 K/uL   nRBC 0.0 0.0 - 0.2 %    Comment: Performed at [facility], [place] [address] [place],  [id number]  Basic metabolic panel     Status: Abnormal   Collection Time: 12/04/22  2:04 AM  Result Value Ref Range   Sodium 135 135 - 145 mmol/L   Potassium 5.2 (H) 3.5 - 5.1 mmol/L   Chloride 98 98 - 111 mmol/L   CO2 26 22 - 32 mmol/L   Glucose, Bld 84  70 - 99 mg/dL    Comment: Glucose reference range applies only to samples taken after fasting for at least 8 hours.   BUN 11 6 - 20 mg/dL   Creatinine, Ser 1.03 0.61 - 1.24 mg/dL   Calcium 9.4 8.9 - 10.3 mg/dL   GFR, Estimated >60 >60 mL/min    Comment: (NOTE) Calculated using the CKD-EPI Creatinine Equation (2021)    Anion gap 11 5 - 15    Comment: Performed at [facility] [address] [place], [place] [address]  Glucose, capillary     Status: None   Collection Time: 12/04/22  3:33 AM  Result Value Ref Range   Glucose-Capillary 89 70 - 99 mg/dL    Comment: Glucose reference range applies only to samples taken after fasting for at least 8 hours.    Recent Results (from the past 240 hour(s))  MRSA Next Gen by PCR, Nasal     Status: None   Collection Time: 11/28/22  9:50 PM   Specimen: Nasal Mucosa; Nasal Swab  Result Value Ref Range Status   MRSA by PCR Next Gen NOT DETECTED  NOT DETECTED Final    Comment: (NOTE) The GeneXpert MRSA Assay (FDA approved for NASAL specimens only), is one component of a comprehensive MRSA colonization surveillance program. It is not intended to diagnose MRSA infection nor to guide or monitor treatment for MRSA infections. Test performance is not FDA approved in patients less than 49 years old. Performed at [facility], [place] [address], [place], [place] [address]     Lipid Panel No results for input(s): "CHOL", "TRIG", "HDL", "CHOLHDL", "VLDL", "LDLCALC" in the last 72 hours.  Studies/Results: DG Abd 1 View  Result Date: 12/03/2022 CLINICAL DATA:  Evaluate NG tube placement. EXAM: ABDOMEN - 1 VIEW COMPARISON:  11/29/22. FINDINGS: Again seen is a feeding tube with tip in the right hemiabdomen within the expected location of the proximal duodenum. No nasogastric tube identified. Mild diffuse gaseous distension of the large and small bowel loops is again noted. Compared with the previous exam the small bowel loops appear decreased in  caliber. IMPRESSION: 1. Feeding tube tip is in the right hemiabdomen in the expected location of the proximal duodenum. No nasogastric tube identified. 2. Persistent gaseous distension of the large and small bowel loops with decreased caliber of the small bowel. Electronically Signed   By: Kerby Moors M.D.   On: 12/03/2022 07:35    Medications: Scheduled:  Chlorhexidine Gluconate Cloth  6 each Topical Daily   clonazePAM  1 mg Per Tube Q12H   cloNIDine  0.1 mg Per Tube TID   enoxaparin (LOVENOX) injection  40 mg Subcutaneous Daily   Eslicarbazepine Acetate  1,600 mg Per Tube Daily   feeding supplement (PROSource TF20)  60 mL Per Tube Daily   lactulose  30 g Per Tube TID   mouth rinse  15 mL Mouth Rinse 4 times per day   polyethylene glycol  17 g Per Tube BID   risperiDONE  3 mg Per Tube QHS   And   risperiDONE  1 mg Per Tube Daily   senna-docusate  1 tablet Per Tube BID   thiamine  100 mg Per Tube Daily   Continuous:  feeding supplement (OSMOLITE 1.5 CAL) Stopped (12/03/22 2102)   valproate sodium 52.5 mL/hr at 12/04/22 0600   valproate sodium Stopped (12/03/22 2203)    Assessment: 27 year old male with 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 Has been admitted previously for similar clinical picture. 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   On exam this AM patient was asleep. I did not wake patient up due to limited clinical utility given he was reportedly awake all night, difficult to control, and easily prone to agitation. Ammonia level is still above the normal range but has downtrended -  unclear if this is sufficient to fully explain patient's continued delirium and agitation. MRI has not yet been obtained due to ongoing clinical barriers. Will continue to observe.   Recommendations: - Anticonvulsant regimen:  - valproic acid 250 mg in AM and 500 mg in afternoon and 500 in evening - Continue his home eslicarbazepine - Ativan PRN seizure recurrence. Can also be used for agitation - Psychiatry is consulting and managing his antipsychotic regimen. Appreciate their recommendations towards titrating  the patient towards a stable, non-agitated and preferably non-sedated state.  - Continue to monitor and manage agitation: Appreciate Psychiatry input.  - Given concern for possible autoimmune encephalopathy, would obtain MRI brain w/wo contrast. Will need to be sedated for MRI. Compare for stability relative to prior MRI from 2021.  - ESR 8, CRP 2.4 - ammonia level 3/13 is 42. - Anti-NMDA receptor autoantibody negative   LOS: 7 days   Camelia Phenes, MD [facility] Intern (PGY-1) 12/04/2022 8:16 AM   NEUROHOSPITALIST ADDENDUM Performed a face to face diagnostic evaluation.   I have reviewed the contents of history and physical exam as documented by PA/ARNP/Resident and agree with above documentation.  I have discussed and formulated the above plan as documented. Edits to the note have been made as needed.  Impression/Key exam findings/Plan: Ammonia downtrended to 42. Continues to either be agitated or be too somnolent. Psychiatry team helping with delirium. Continue VPA at current dose of 250 in AM, 500 in afternoon and 500 in evening.  Let us know when the MRI is done to review the images. If MRI is normal, no further workup.  We will signoff.  Donnetta Simpers, MD [facility] [phone]   If 7pm to 7am, please call on call as listed on AMION.

## 2022-12-04 NOTE — Progress Notes (Signed)
[facility] Progress Note Patient Name: Dylan Moon DOB: 09-26-1995 MRN: [id number]   Date of Service  12/04/2022  HPI/Events of Note  Patient with agitated delirium / psychosis, needs restraints renewed.  eICU Interventions  Restraints order renewed.        Kerry Kass Keerthi Hazell 12/04/2022, 11:20 PM

## 2022-12-05 DIAGNOSIS — R569 Unspecified convulsions: Secondary | ICD-10-CM | POA: Diagnosis not present

## 2022-12-05 LAB — AMMONIA: Ammonia: 125 umol/L — ABNORMAL HIGH (ref 9–35)

## 2022-12-05 LAB — BASIC METABOLIC PANEL
Anion gap: 10 (ref 5–15)
BUN: 16 mg/dL (ref 6–20)
CO2: 26 mmol/L (ref 22–32)
Calcium: 9 mg/dL (ref 8.9–10.3)
Chloride: 97 mmol/L — ABNORMAL LOW (ref 98–111)
Creatinine, Ser: 0.89 mg/dL (ref 0.61–1.24)
GFR, Estimated: >60 mL/min (ref >60)
Glucose, Bld: 142 mg/dL — ABNORMAL HIGH (ref 70–99)
Potassium: 4.4 mmol/L (ref 3.5–5.1)
Sodium: 133 mmol/L — ABNORMAL LOW (ref 135–145)

## 2022-12-05 LAB — CBC
HCT: 43.1 % (ref 39.0–52.0)
Hemoglobin: 14.7 g/dL (ref 13.0–17.0)
MCH: 28.4 pg (ref 26.0–34.0)
MCHC: 34.1 g/dL (ref 30.0–36.0)
MCV: 83.2 fL (ref 80.0–100.0)
Platelets: 283 10*3/uL (ref 150–400)
RBC: 5.18 MIL/uL (ref 4.22–5.81)
RDW: 12.7 % (ref 11.5–15.5)
WBC: 7.7 10*3/uL (ref 4.0–10.5)
nRBC: 0 % (ref 0.0–0.2)

## 2022-12-05 LAB — GLUCOSE, CAPILLARY
Glucose-Capillary: 136 mg/dL — ABNORMAL HIGH (ref 70–99)
Glucose-Capillary: 113 mg/dL — ABNORMAL HIGH (ref 70–99)
Glucose-Capillary: 132 mg/dL — ABNORMAL HIGH (ref 70–99)
Glucose-Capillary: 118 mg/dL — ABNORMAL HIGH (ref 70–99)
Glucose-Capillary: 117 mg/dL — ABNORMAL HIGH (ref 70–99)
Glucose-Capillary: 104 mg/dL — ABNORMAL HIGH (ref 70–99)

## 2022-12-05 MED ORDER — CARBAMAZEPINE 100 MG PO CHEW
100.0000 mg | CHEWABLE_TABLET | Freq: Four times a day (QID) | ORAL | Status: DC
  Filled 2022-12-05 (×2): qty 1

## 2022-12-05 MED ORDER — NICOTINE 21 MG/24HR TD PT24
21.0000 mg | MEDICATED_PATCH | Freq: Every day | TRANSDERMAL | Status: DC
  Administered 2022-12-05 – 2022-12-08 (×4): 21 mg via TRANSDERMAL
  Filled 2022-12-05 (×4): qty 1

## 2022-12-05 MED ORDER — CLONIDINE HCL 0.1 MG PO TABS
0.1000 mg | ORAL_TABLET | Freq: Two times a day (BID) | ORAL | Status: DC
  Administered 2022-12-05: 0.1 mg
  Filled 2022-12-05: qty 1

## 2022-12-05 MED ORDER — AMOXICILLIN 400 MG/5ML PO SUSR
1000.0000 mg | Freq: Three times a day (TID) | ORAL | Status: DC
  Administered 2022-12-05 – 2022-12-07 (×6): 1000 mg
  Filled 2022-12-05 (×10): qty 12.5

## 2022-12-05 MED ORDER — SODIUM CHLORIDE 0.9 % IV SOLN
25.0000 mg | Freq: Four times a day (QID) | INTRAVENOUS | Status: DC | PRN
  Administered 2022-12-05: 25 mg via INTRAVENOUS
  Filled 2022-12-05: qty 1

## 2022-12-05 MED ORDER — TOPIRAMATE 25 MG PO TABS
50.0000 mg | ORAL_TABLET | Freq: Two times a day (BID) | ORAL | Status: DC
  Administered 2022-12-05 – 2022-12-07 (×5): 50 mg
  Filled 2022-12-05 (×9): qty 2

## 2022-12-05 MED ORDER — LACTULOSE 10 GM/15ML PO SOLN: 30.0000 g | Freq: Three times a day (TID) | ORAL | Status: DC

## 2022-12-05 MED ORDER — LACTULOSE 10 GM/15ML PO SOLN
30.0000 g | Freq: Three times a day (TID) | ORAL | Status: DC
  Administered 2022-12-05 (×2): 30 g
  Filled 2022-12-05 (×3): qty 45

## 2022-12-05 MED ORDER — LACTULOSE 10 GM/15ML PO SOLN
30.0000 g | Freq: Three times a day (TID) | ORAL | Status: DC | PRN
  Administered 2022-12-05: 30 g via ORAL
  Filled 2022-12-05: qty 45

## 2022-12-05 MED ORDER — TOPIRAMATE 25 MG PO TABS
50.0000 mg | ORAL_TABLET | Freq: Two times a day (BID) | ORAL | Status: DC
  Administered 2022-12-07 – 2022-12-08 (×2): 50 mg via ORAL
  Filled 2022-12-05 (×8): qty 2
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
## 2022-12-05 NOTE — Progress Notes (Addendum)
NAME:  Dylan Moon, MRN:  [id number], DOB:  1996-06-19, LOS: 8 ADMISSION DATE:  11/26/2022, CONSULTATION DATE:  11/27/2022 REFERRING MD:  Dr. Gwendlyn Deutscher , CHIEF COMPLAINT: Severe agitation   History of Present Illness:  Dylan Moon is a 27 year old male with past medical history significant for seizures, ADHD, bipolar, schizoaffective disease, scoliosis, and medical noncompliance who presented to the emergency department 3/5 for seizures, felt secondary to medication noncompliance.  Patient was admitted per family medicine teaching service with neurology consultation.  Mid morning of 3/6 patient progressively became more violent and agitated towards staff resulting in security being called, application of violent restraints, and eventually addition of chemical restraints as well. Given need for close monitoring of mentation and airway after chemical restraints PCCM was consulted    Pertinent  Medical History  Seizures, ADHD, bipolar, schizoaffective disease, scoliosis, and medical noncompliance  Significant Hospital Events: Including procedures, antibiotic start and stop dates in addition to other pertinent events   3/5 presented to ED after 1.5-minute tonic-clonic seizure per family 3/6 severely agitated and combative towards staff resulting in both violent and chemical restraints  3/8 continued severe agitation with escalating behavior and requiring multiple sedating medications until last night, calm this morning  3/11 No acute issues overnight, required one dose of versed for agitation  3/13 MRI non-acute. Started on precedex.   Interim History / Subjective:  Calmer overnight on precedex. MRI non-acute KUB confirmed post-pyloric tube placement.   Objective   Blood pressure (!) 143/82, pulse 80, temperature 98 F (36.7 C), temperature source Axillary, resp. rate 20, weight 85.8 kg, SpO2 96 %.        Intake/Output Summary (Last 24 hours) at 12/05/2022 [id number] Last data filed at  12/05/2022 0600 Gross per 24 hour  Intake 1248.28 ml  Output 1175 ml  Net 73.28 ml    Filed Weights   12/01/22 0500 12/03/22 0500 12/04/22 1117  Weight: 84.4 kg 84.4 kg 85.8 kg   Physical Exam   General: young adult male resting comfortably HEENT: [place]/AT, PERRL, no JVD Neuro: Much calmer this morning. Confused, but more interactive.  CV: RRR, no MRG PULM:  Clear, no distress GI: Soft, NT, ND Extremities: No acute deformity Skin: grossly intact  Resolved Hospital Problem list   AKI  Assessment & Plan:  Epilepsy with longstanding seizure history, vagal nerve stimulator. MRI non-acute P: Seizure precautions Continue oral Esilcarbazepine per neuro VPA level reduced due to increased ammonia level, which had trended down. Lactulose was stopped, but now trending back up. Restart lactulose.  D/w neuro and psych. Stopping VPA, starting Topamax.  Phenobarb DC 3/11 while we allow to slowly clear, check level in AM Aspiration precautions   History of medication noncompliance Acute psychosis with aggressive behavior Bipolar disorder PTSD hx ADHD hx -His previous outpatient diagnoses of ADHD and anxiety do not fully explain his presentation, which appears to be schizoaffective disorder, bipolar disorder, or post-ictal psychosis -Severe aggression and agitation has been very difficult to control requiring benzos, anti-psychotics, precedex, ketamine, and benadryl P: [facility] psychiat[facility] consultation.  Precedex for RASS goal 0 to -1.  Continues to require non-violent physical restraint.  Continue Clonidine,  clonazepam, and Risperidone Safety sitter in place  Monitor QTc  Patient was started on Invega IM in 2021 for a similar episode and family percieved the best benefit from this. Will defer to Psychiatry.   Dental infection: mother informed us today he had been started on amoxicillin for dental infection but did not finish  course. - Start amoxicillin x 5 days  At risk  malnutrition due to decreased oral intake  P: Continue TFs [place]rak confirmed post-pyloric  Best Practice (right click and "Reselect all SmartList Selections" daily)   Diet/type: NPO until mental status improves - TF DVT prophylaxis: LMWH GI prophylaxis: N/A Lines: N/A Foley:  External cath in place  Code Status:  full code Last date of multidisciplinary goals of care discussion: Family updated at bedside 3/12  Critical care time:  37 minutes    Georgann Housekeeper, AGACNP-BC [place] for personal pager PCCM on call pager [phone] until 7pm. Please call Elink 7p-7a. [phone]  12/05/2022 7:22 AM

## 2022-12-05 NOTE — Consult Note (Addendum)
Subjective: Patient could not participate in interview.  Objective: Current vital signs: BP 130/80   Pulse 73   Temp 98.1 F (36.7 C) (Oral)   Resp (!) 26   Wt 84.9 kg   SpO2 95%   BMI 26.11 kg/m  Vital signs in last 24 hours: Temp:  [98 F (36.7 C)-99.4 F (37.4 C)] 98.1 F (36.7 C) (03/14 1200) Pulse Rate:  [68-100] 73 (03/14 1000) Resp:  [12-26] 26 (03/14 1000) BP: (100-143)/(66-105) 130/80 (03/14 1000) SpO2:  [90 %-98 %] 95 % (03/14 1000) Weight:  [84.9 kg] 84.9 kg (03/14 0704)  Intake/Output from previous day: 03/13 0701 - 03/14 0700 In: 1439.1 [I.V.:157.8; NG/GT:1082.3; IV Piggyback:199.1] Out: 1175 [Urine:1175] Intake/Output this shift: Total I/O In: 353.5 [I.V.:75.1; NG/GT:260; IV Piggyback:18.4] Out: -  Nutritional status:  Diet Order     None      General: asleep. Diaphoretic HEENT: normocephalic and atraumatic Respiratory: sonorous breathing Extremities: moving all extremities spontaneously   Neurologic Exam: Mental Status: Maxime E Peveler is asleep. He had no response to noxious stimuli.  Cranial Nerves: II:  Pupils are pinpoint  Motor: Tone and bulk: normal tone throughout; no atrophy noted  Sensory: unable to assess  Cerebellar: Unable to assess  Gait: Unable to assess  Lab Results: Results for orders placed or performed during the hospital encounter of 11/26/22 (from the past 48 hour(s))  Glucose, capillary     Status: Abnormal   Collection Time: 12/03/22  4:01 PM  Result Value Ref Range   Glucose-Capillary 110 (H) 70 - 99 mg/dL    Comment: Glucose reference range applies only to samples taken after fasting for at least 8 hours.  Glucose, capillary     Status: None   Collection Time: 12/03/22  7:38 PM  Result Value Ref Range   Glucose-Capillary 94 70 - 99 mg/dL    Comment: Glucose reference range applies only to samples taken after fasting for at least 8 hours.  Glucose, capillary     Status: Abnormal   Collection Time:  12/03/22 11:23 PM  Result Value Ref Range   Glucose-Capillary 100 (H) 70 - 99 mg/dL    Comment: Glucose reference range applies only to samples taken after fasting for at least 8 hours.  Magnesium     Status: None   Collection Time: 12/04/22  2:04 AM  Result Value Ref Range   Magnesium 2.2 1.7 - 2.4 mg/dL    Comment: Performed at [facility], [place] [address], [place], [place] [address]  Phosphorus     Status: Abnormal   Collection Time: 12/04/22  2:04 AM  Result Value Ref Range   Phosphorus 5.2 (H) 2.5 - 4.6 mg/dL    Comment: Performed at [facility] [address], [place], [place] [address]  Ammonia     Status: Abnormal   Collection Time: 12/04/22  2:04 AM  Result Value Ref Range   Ammonia 42 (H) 9 - 35 umol/L    Comment: Performed at [facility], [place] [address] [place], [place] [address]  CBC     Status: None   Collection Time: 12/04/22  2:04 AM  Result Value Ref Range   WBC 8.4 4.0 - 10.5 K/uL   RBC 5.33 4.22 - 5.81 MIL/uL   Hemoglobin 15.2 13.0 - 17.0 g/dL   HCT 45.1 39.0 - 52.0 %   MCV 84.6 80.0 - 100.0 fL   MCH 28.5 26.0 - 34.0 pg   MCHC 33.7 30.0 -  36.0 g/dL   RDW 13.2 11.5 - 15.5 %   Platelets 285 150 - 400 K/uL   nRBC 0.0 0.0 - 0.2 %    Comment: Performed at [facility], [place] [address], [place], [place] [id number]  Basic metabolic panel     Status: Abnormal   Collection Time: 12/04/22  2:04 AM  Result Value Ref Range   Sodium 135 135 - 145 mmol/L   Potassium 5.2 (H) 3.5 - 5.1 mmol/L   Chloride 98 98 - 111 mmol/L   CO2 26 22 - 32 mmol/L   Glucose, Bld 84 70 - 99 mg/dL    Comment: Glucose reference range applies only to samples taken after fasting for at least 8 hours.   BUN 11 6 - 20 mg/dL   Creatinine, Ser 1.03 0.61 - 1.24 mg/dL   Calcium 9.4 8.9 - 10.3 mg/dL   GFR, Estimated >60 >60 mL/min    Comment: (NOTE) Calculated using the CKD-EPI Creatinine Equation (2021)    Anion gap 11 5 - 15    Comment: Performed at [facility] [address], [place], [place] [address]  Glucose, capillary     Status: None   Collection Time: 12/04/22  3:33 AM  Result Value Ref Range   Glucose-Capillary 89 70 - 99 mg/dL    Comment: Glucose reference range applies only to samples taken after fasting for at least 8 hours.  Glucose, capillary     Status: Abnormal   Collection Time: 12/04/22 11:15 AM  Result Value Ref Range   Glucose-Capillary 130 (H) 70 - 99 mg/dL    Comment: Glucose reference range applies only to samples taken after fasting for at least 8 hours.  Glucose, capillary     Status: Abnormal   Collection Time: 12/04/22  3:21 PM  Result Value Ref Range   Glucose-Capillary 100 (H) 70 - 99 mg/dL    Comment: Glucose reference range applies only to samples taken after fasting for at least 8 hours.  Glucose, capillary     Status: Abnormal   Collection Time: 12/04/22  7:28 PM  Result Value Ref Range   Glucose-Capillary 107 (H) 70 - 99 mg/dL    Comment: Glucose reference range applies only to samples taken after fasting for at least 8 hours.  Glucose, capillary     Status: Abnormal   Collection Time: 12/04/22 11:16 PM  Result Value Ref Range   Glucose-Capillary 175 (H) 70 - 99 mg/dL    Comment: Glucose reference range applies only to samples taken after fasting for at least 8 hours.  CBC     Status: None   Collection Time: 12/05/22  2:01 AM  Result Value Ref Range   WBC 7.7 4.0 - 10.5 K/uL   RBC 5.18 4.22 - 5.81 MIL/uL   Hemoglobin 14.7 13.0 - 17.0 g/dL   HCT 43.1 39.0 - 52.0 %   MCV 83.2 80.0 - 100.0 fL   MCH 28.4 26.0 - 34.0 pg   MCHC 34.1 30.0 - 36.0 g/dL   RDW 12.7 11.5 - 15.5 %   Platelets 283 150 - 400 K/uL   nRBC 0.0 0.0 - 0.2 %    Comment: Performed at [facility], [place] [address], [place], [place] [id number]  Basic metabolic panel     Status: Abnormal   Collection Time: 12/05/22  2:01 AM  Result Value Ref Range   Sodium 133 (L) 135 - 145 mmol/L   Potassium 4.4 3.5 -  5.1 mmol/L   Chloride  97 (L) 98 - 111 mmol/L   CO2 26 22 - 32 mmol/L   Glucose, Bld 142 (H) 70 - 99 mg/dL    Comment: Glucose reference range applies only to samples taken after fasting for at least 8 hours.   BUN 16 6 - 20 mg/dL   Creatinine, Ser 0.89 0.61 - 1.24 mg/dL   Calcium 9.0 8.9 - 10.3 mg/dL   GFR, Estimated >60 >60 mL/min    Comment: (NOTE) Calculated using the CKD-EPI Creatinine Equation (2021)    Anion gap 10 5 - 15    Comment: Performed at [facility] [address], [place], [place] [address]  Ammonia     Status: Abnormal   Collection Time: 12/05/22  2:01 AM  Result Value Ref Range   Ammonia 125 (H) 9 - 35 umol/L    Comment: Performed at [facility], [place] [address], [place], [place] [address]  Glucose, capillary     Status: Abnormal   Collection Time: 12/05/22  3:01 AM  Result Value Ref Range   Glucose-Capillary 136 (H) 70 - 99 mg/dL    Comment: Glucose reference range applies only to samples taken after fasting for at least 8 hours.  Glucose, capillary     Status: Abnormal   Collection Time: 12/05/22  7:17 AM  Result Value Ref Range   Glucose-Capillary 113 (H) 70 - 99 mg/dL    Comment: Glucose reference range applies only to samples taken after fasting for at least 8 hours.  Glucose, capillary     Status: Abnormal   Collection Time: 12/05/22 11:06 AM  Result Value Ref Range   Glucose-Capillary 132 (H) 70 - 99 mg/dL    Comment: Glucose reference range applies only to samples taken after fasting for at least 8 hours.    Recent Results (from the past 240 hour(s))  MRSA Next Gen by PCR, Nasal     Status: None   Collection Time: 11/28/22  9:50 PM   Specimen: Nasal Mucosa; Nasal Swab  Result Value Ref Range Status   MRSA by PCR Next Gen NOT DETECTED NOT DETECTED Final    Comment: (NOTE) The GeneXpert MRSA Assay (FDA approved for NASAL specimens only), is one component of a comprehensive MRSA colonization surveillance program. It is not intended to diagnose MRSA infection  nor to guide or monitor treatment for MRSA infections. Test performance is not FDA approved in patients less than 28 years old. Performed at [facility], [place] [address], [place], [place] [address]     Lipid Panel No results for input(s): "CHOL", "TRIG", "HDL", "CHOLHDL", "VLDL", "LDLCALC" in the last 72 hours.  Studies/Results: MR BRAIN W WO CONTRAST  Result Date: 12/04/2022 CLINICAL DATA:  Encephalopathy.  Seizures.  Altered mental status. EXAM: MRI HEAD WITHOUT AND WITH CONTRAST TECHNIQUE: Multiplanar, multiecho pulse sequences of the brain and surrounding structures were obtained without and with intravenous contrast. CONTRAST:  52m GADAVIST GADOBUTROL 1 MMOL/ML IV SOLN COMPARISON:  Head CT 04/17/2021.  MRI brain 07/12/2020. FINDINGS: Brain: No acute infarct or hemorrhage. No mass or midline shift. No abnormal enhancement. No hydrocephalus or extra-axial collection. Basilar cisterns are patent. Symmetric size and signal of the hippocampi. Bilateral incomplete hippocampal inversion. 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 Skull and upper cervical spine: Normal marrow signal. Sinuses/Orbits: Trace fluid in the sphenoid sinuses. Orbits are unremarkable. Other:  None. IMPRESSION: 1. No acute intracranial process. 2. 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 Electronically Signed   By: Emmit Alexanders M.D.   On: 12/04/2022 20:03   DG Abd Portable 1V  Result Date: 12/04/2022 CLINICAL DATA:  A 27 year old male presents for evaluation of feeding tube. EXAM: PORTABLE ABDOMEN - 1 VIEW COMPARISON:  December 04, 2022 FINDINGS: Feeding tube in the proximal jejunum. Contrast was injected on the floor for this evaluation. There is further distension of the: Than was seen previously. Scattered gas-filled small bowel loops are present. Complete evaluation of  the abdomen was not performed for this feeding tube confirmation. On limited assessment there is no acute skeletal process. IMPRESSION: 1. Feeding tube in the proximal jejunum. 2. Further distension of the colon, correlate with signs of ileus and consider follow-up as warranted. Electronically Signed   By: Zetta Bills M.D.   On: 12/04/2022 16:35   DG Abd Portable 1V  Result Date: 12/04/2022 CLINICAL DATA:  Encounter for feeding tube placement. EXAM: PORTABLE ABDOMEN - 1 VIEW COMPARISON:  Radiographs 11/29/2022 and 07/13/2020.  CT 07/12/2020. FINDINGS: 1419 hours. Tip of the feeding tube projects superiorly over the T12-L1 disc space. The course of the tube suggests that it is looped in the stomach. The visualized bowel gas pattern is nonobstructive. Telemetry leads overlie the lower chest and upper abdomen. Mild convex right thoracolumbar scoliosis. IMPRESSION: Feeding tube tip projects over the T12-L1 disc space, likely looped in the stomach. Electronically Signed   By: Richardean Sale M.D.   On: 12/04/2022 14:35    Medications: Scheduled:  amoxicillin  1,000 mg Per Tube Q8H   Chlorhexidine Gluconate Cloth  6 each Topical Daily   clonazePAM  2 mg Per Tube Q12H   cloNIDine  0.1 mg Per Tube TID   enoxaparin (LOVENOX) injection  40 mg Subcutaneous Daily   Eslicarbazepine Acetate  1,600 mg Per Tube Daily   feeding supplement (PROSource TF20)  60 mL Per Tube Daily   lactulose  30 g Per Tube TID   nicotine  21 mg Transdermal Daily   mouth rinse  15 mL Mouth Rinse 4 times per day   risperiDONE  3 mg Per Tube QHS   And   risperiDONE  1 mg Per Tube Daily   thiamine  100 mg Per Tube Daily   topiramate  50 mg Oral BID   Or   topiramate  50 mg Per Tube BID   Continuous:  dexmedetomidine (PRECEDEX) IV infusion 0.9 mcg/kg/hr (12/05/22 1000)   feeding supplement (OSMOLITE 1.5 CAL) 1,000 mL (12/05/22 1214)   promethazine (PHENERGAN) injection (IM or IVPB) 25 mg (12/05/22 1207)   MRI w w/o  intracranial process (3/13) IMPRESSION: 1. No acute intracranial process. 2. 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  Assessment: 27 year old male with 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 Has been admitted previously for similar clinical picture. 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   On exam patient was asleep. He was audibly snoring and would not rouse to voice or noxious stimuli. MRI yesterday showed no acute findings. Of note, he also  had pinpoint pupils and was profusely perspiring. I saw the patient with Dr. Leverne Humbles (on the psychiatry consult service) who also was in the room and we discussed the finding. Per Dr. Leverne Humbles, clonidine could potentially cause pinpoint pupils and she will be adjusting the dose from TID to BID. I will reassess patient tomorrow.   Recommendations: - Anticonvulsant regimen:  - stopped valproic acid due to hyperammonemia - started topiramate 50 mg BID - continue his home eslicarbazepine - Psychiatry is consulting and managing his antipsychotic regimen. Appreciate their recommendations towards titrating the patient towards a stable, non-agitated and preferably non-sedated state.  - Continue to monitor and manage agitation: Appreciate psychiatry input.  - Given concern for possible autoimmune encephalopathy, would obtain MRI brain w/wo contrast. Will need to be sedated for MRI. Compare for stability relative to prior MRI from 2021.  - ESR 8, CRP 2.4 - Anti-NMDA receptor autoantibody negative - ammonia level 3/14 up from  42 to 125   LOS: 8 days   Camelia Phenes, MD [facility] (PGY-1) 12/05/2022 12:41 PM  NEUROHOSPITALIST ADDENDUM Performed a face to face diagnostic evaluation.   I have reviewed the contents of history and physical exam as documented by PA/ARNP/Resident and agree with above documentation.  I have discussed and formulated the above plan as documented. Edits to the note have been made as needed.  Impression/Key exam findings/Plan: ammonia up to 125 today after lactulose was stopped. No choice but to discontinue valproic acid. With patient on Aptiom, tegretol and trileptal not a good choice. Will instead use topamax '50mg'$  BID.  I examined him earlier than Dr. Edd Arbour did and for me, he would partially open his eyes, make brief eye contact, was able to count 2 fingers, identify his mother in the room.  Donnetta Simpers, MD [facility] [phone]   If 7pm to 7am, please call on call as listed on AMION.

## 2022-12-05 NOTE — Progress Notes (Signed)
[facility] Progress Note Patient Name: Dylan Moon DOB: 09-30-1995 MRN: [id number]   Date of Service  12/05/2022  HPI/Events of Note  Lactulose is 125, up from 14 yesterday.  eICU Interventions  Lactulose re-ordered.        Kerry Kass Melaysia Streed 12/05/2022, 4:45 AM

## 2022-12-05 NOTE — Care Plan (Signed)
VNS was turned on at around 1115 on 3/14/202. Resumed previous settings.      Normal Autostim Magnet  Output 1.72m 1.6248m1.7560mFrequency '20Hz'$       Pule Width 250usec 250usec 250usec  On time 30 sec 30 sec 30 sec  Off time 3 min      Duty cycle 16%       Zayonna Ayuso O YBarbra Sarks

## 2022-12-05 NOTE — Progress Notes (Signed)
Nutrition Follow-up  DOCUMENTATION CODES:   Not applicable  INTERVENTION:   Continue tube feeds via post-pyloric Cortrak: - Osmolite 1.5 @ 65 ml/hr (1560 ml/day) - PROSource TF20 60 ml daily  Tube feeding regimen provides 2420 kcal, 118 grams of protein, and 1189 ml of H2O.   NUTRITION DIAGNOSIS:   Inadequate oral intake related to inability to eat as evidenced by NPO status.  Ongoing, being addressed via TF  GOAL:   Patient will meet greater than or equal to 90% of their needs  Met via TF  MONITOR:   TF tolerance, Diet advancement  REASON FOR ASSESSMENT:   Consult Enteral/tube feeding initiation and management  ASSESSMENT:   Pt with PMH of sz, ADHD, bipolar, schizoaffective dz, scoliosis, marijuana use, and medical noncompliance admitted 3/5 for seizures possibly due to medical noncompliance.  03/08 - s/p Cortrak placement (tip near pylorus, possibly in first portion of duodenum) 03/13 - Cortrak tube advanced to proximal jejunum  Discussed pt with RN and during ICU rounds. Psychiatry involved and adjusting medications.  Cortrak advanced to proximal jejunum yesterday to decrease aspiration risk. Prior to this, tube feeds were being held on and off due to pt's agitation and inability to maintain HOB 30 degrees. RD reviewed pump history. Pt received 1050 ml of tube feeding over the last 24 hours (goal 1560 ml). Tube feeds infusing at goal rate at time of RD visit and pt tolerating without issue. Pt remains NPO with ongoing intermittent confusion and agitation.  Per abdominal x-ray yesterday, there is distention of the colon which may indicate ileus.  Admit weight: 84.7 kg Current weight: 84.9 kg  Current TF: Osmolite 1.5 @ 65 ml/hr, PROSource TF20 60 ml daily  Medications reviewed and include: lactulose, thiamine 100 mg daily, precedex drip  Labs reviewed: sodium 133, phosphorus 5.2 on 3/13 CBG's: 100-175 x 24 hours  UOP: 1175 ml x 24 hours I/O's: +7.3 L since  admit  Diet Order:   Diet Order     None       EDUCATION NEEDS:   No education needs have been identified at this time  Skin:  Skin Assessment: Reviewed RN Assessment  Last BM:  12/04/22 multiple type 7  Height:   Ht Readings from Last 1 Encounters:  11/19/22 '5\' 11"'$  (1.803 m)    Weight:   Wt Readings from Last 1 Encounters:  12/05/22 84.9 kg    BMI:  Body mass index is 26.11 kg/m.  Estimated Nutritional Needs:   Kcal:  2200-2500  Protein:  105-125 grams  Fluid:  >2 L/day    Gustavus Bryant, MS, RD, LDN Inpatient Clinical Dietitian Please see AMiON for contact information.

## 2022-12-06 DIAGNOSIS — F4325 Adjustment disorder with mixed disturbance of emotions and conduct: Secondary | ICD-10-CM

## 2022-12-06 DIAGNOSIS — R569 Unspecified convulsions: Secondary | ICD-10-CM | POA: Diagnosis not present

## 2022-12-06 DIAGNOSIS — Z91148 Patient's other noncompliance with medication regimen for other reason: Secondary | ICD-10-CM

## 2022-12-06 DIAGNOSIS — F79 Unspecified intellectual disabilities: Secondary | ICD-10-CM

## 2022-12-06 DIAGNOSIS — F1290 Cannabis use, unspecified, uncomplicated: Secondary | ICD-10-CM

## 2022-12-06 LAB — CBC
HCT: 42.1 % (ref 39.0–52.0)
Hemoglobin: 13.8 g/dL (ref 13.0–17.0)
MCH: 27.7 pg (ref 26.0–34.0)
MCHC: 32.8 g/dL (ref 30.0–36.0)
MCV: 84.4 fL (ref 80.0–100.0)
Platelets: 317 10*3/uL (ref 150–400)
RBC: 4.99 MIL/uL (ref 4.22–5.81)
RDW: 13 % (ref 11.5–15.5)
WBC: 7 10*3/uL (ref 4.0–10.5)
nRBC: 0 % (ref 0.0–0.2)

## 2022-12-06 LAB — GLUCOSE, CAPILLARY
Glucose-Capillary: 153 mg/dL — ABNORMAL HIGH (ref 70–99)
Glucose-Capillary: 117 mg/dL — ABNORMAL HIGH (ref 70–99)
Glucose-Capillary: 124 mg/dL — ABNORMAL HIGH (ref 70–99)
Glucose-Capillary: 110 mg/dL — ABNORMAL HIGH (ref 70–99)

## 2022-12-06 LAB — BASIC METABOLIC PANEL
Anion gap: 10 (ref 5–15)
BUN: 16 mg/dL (ref 6–20)
CO2: 22 mmol/L (ref 22–32)
Calcium: 9 mg/dL (ref 8.9–10.3)
Chloride: 99 mmol/L (ref 98–111)
Creatinine, Ser: 0.87 mg/dL (ref 0.61–1.24)
GFR, Estimated: >60 mL/min (ref >60)
Glucose, Bld: 135 mg/dL — ABNORMAL HIGH (ref 70–99)
Potassium: 3.7 mmol/L (ref 3.5–5.1)
Sodium: 131 mmol/L — ABNORMAL LOW (ref 135–145)

## 2022-12-06 LAB — AMMONIA: Ammonia: 17 umol/L (ref 9–35)

## 2022-12-06 LAB — MAGNESIUM: Magnesium: 2.2 mg/dL (ref 1.7–2.4)

## 2022-12-06 LAB — PHENOBARBITAL LEVEL: Phenobarbital: 47.5 ug/mL — ABNORMAL HIGH (ref 15.0–40.0)

## 2022-12-06 LAB — PHOSPHORUS: Phosphorus: 3.6 mg/dL (ref 2.5–4.6)

## 2022-12-06 MED ORDER — MELATONIN 3 MG PO TABS
3.0000 mg | ORAL_TABLET | Freq: Every day | ORAL | Status: DC
  Administered 2022-12-06 – 2022-12-07 (×2): 3 mg
  Filled 2022-12-06 (×2): qty 1

## 2022-12-06 MED ORDER — CLONIDINE HCL 0.2 MG PO TABS
0.2000 mg | ORAL_TABLET | Freq: Three times a day (TID) | ORAL | Status: DC
  Administered 2022-12-06 – 2022-12-07 (×4): 0.2 mg
  Filled 2022-12-06 (×4): qty 1

## 2022-12-06 MED ORDER — CLONAZEPAM 1 MG PO TABS: 1.0000 mg | ORAL_TABLET | Freq: Every day | ORAL | Status: DC

## 2022-12-06 MED ORDER — CLONAZEPAM 1 MG PO TABS: 2.0000 mg | ORAL_TABLET | Freq: Every day | ORAL | Status: DC

## 2022-12-06 MED ORDER — POTASSIUM CHLORIDE 20 MEQ PO PACK
40.0000 meq | PACK | Freq: Once | ORAL | Status: AC
  Administered 2022-12-06: 40 meq
  Filled 2022-12-06: qty 2

## 2022-12-06 MED ORDER — CLONAZEPAM 0.5 MG PO TABS: 0.5000 mg | ORAL_TABLET | Freq: Every day | ORAL | Status: DC

## 2022-12-06 MED ORDER — CLONAZEPAM 1 MG PO TABS
1.0000 mg | ORAL_TABLET | Freq: Every day | ORAL | Status: AC
  Administered 2022-12-07: 1 mg
  Filled 2022-12-06: qty 1

## 2022-12-06 MED ORDER — CLONAZEPAM 0.25 MG PO TBDP: 0.2500 mg | ORAL_TABLET | Freq: Every day | ORAL | Status: DC

## 2022-12-06 MED ORDER — CLONAZEPAM 0.5 MG PO TABS
0.5000 mg | ORAL_TABLET | Freq: Every day | ORAL | Status: AC
  Administered 2022-12-08: 0.5 mg
  Filled 2022-12-06: qty 1

## 2022-12-06 NOTE — Consult Note (Addendum)
Subjective: Patient was often making nonsensical but coherent statements and alternating between laughing and crying, joking around with his uncle who was present in the room.  Objective: Current vital signs: BP 116/77   Pulse 65   Temp (!) 97.1 F (36.2 C) (Axillary)   Resp (!) 21   Wt 84.5 kg   SpO2 98%   BMI 25.98 kg/m  Vital signs in last 24 hours: Temp:  [97.1 F (36.2 C)-99.1 F (37.3 C)] 97.1 F (36.2 C) (03/15 0715) Pulse Rate:  [63-84] 65 (03/15 0700) Resp:  [9-27] 21 (03/15 0700) BP: (109-152)/(62-91) 116/77 (03/15 0700) SpO2:  [95 %-99 %] 98 % (03/15 0700) Weight:  [84.5 kg] 84.5 kg (03/15 0500)  Intake/Output from previous day: 03/14 0701 - 03/15 0700 In: 1982.3 [I.V.:278.7; NG/GT:1625; IV Piggyback:78.7] Out: [id number] [Urine:1375] Intake/Output this shift: No intake/output data recorded. Nutritional status:  Diet Order     None      General: asleep. Diaphoretic HEENT: normocephalic and atraumatic Respiratory: sonorous breathing Extremities: moving all extremities spontaneously   Neurologic Exam: Mental Status: Parke E Nosbisch is somnolent; he is oriented to self, oriented to place, oriented to situation, and oriented to other person/s, namely his uncle who was present in the room. He says it is 2024 when asked the year. Says 2023 when asked the monht. Speech was slurred and fluent. He was able to follow only simple commands.  Cranial Nerves: II:  Visual fields grossly normal; pupils equal, round, reactive to light and accommodation III,IV, VI: no ptosis, extra-ocular motions intact bilaterally V,VII: smile symmetric VIII: hearing intact to voice XII: midline tongue extension without atrophy and without fasciculations  Motor: Upper extremities: Right 5/5 full power Left 5/5 full power  Lower extremities: Right 5/5 full power Left 5/5 full power  Tone and bulk: normal tone throughout; no atrophy noted  Cerebellar: Finger-to-nose test not  assessed during this encounter, heel-to-shin test not assessed during this encounter  Gait: not observed during encounter  Lab Results: Results for orders placed or performed during the hospital encounter of 11/26/22 (from the past 48 hour(s))  Glucose, capillary     Status: Abnormal   Collection Time: 12/04/22 11:15 AM  Result Value Ref Range   Glucose-Capillary 130 (H) 70 - 99 mg/dL    Comment: Glucose reference range applies only to samples taken after fasting for at least 8 hours.  Glucose, capillary     Status: Abnormal   Collection Time: 12/04/22  3:21 PM  Result Value Ref Range   Glucose-Capillary 100 (H) 70 - 99 mg/dL    Comment: Glucose reference range applies only to samples taken after fasting for at least 8 hours.  Glucose, capillary     Status: Abnormal   Collection Time: 12/04/22  7:28 PM  Result Value Ref Range   Glucose-Capillary 107 (H) 70 - 99 mg/dL    Comment: Glucose reference range applies only to samples taken after fasting for at least 8 hours.  Glucose, capillary     Status: Abnormal   Collection Time: 12/04/22 11:16 PM  Result Value Ref Range   Glucose-Capillary 175 (H) 70 - 99 mg/dL    Comment: Glucose reference range applies only to samples taken after fasting for at least 8 hours.  CBC     Status: None   Collection Time: 12/05/22  2:01 AM  Result Value Ref Range   WBC 7.7 4.0 - 10.5 K/uL   RBC 5.18 4.22 - 5.81 MIL/uL   Hemoglobin 14.7 13.0 -  17.0 g/dL   HCT 43.1 39.0 - 52.0 %   MCV 83.2 80.0 - 100.0 fL   MCH 28.4 26.0 - 34.0 pg   MCHC 34.1 30.0 - 36.0 g/dL   RDW 12.7 11.5 - 15.5 %   Platelets 283 150 - 400 K/uL   nRBC 0.0 0.0 - 0.2 %    Comment: Performed at [facility], [place] [address] [place], [place] [id number]  Basic metabolic panel     Status: Abnormal   Collection Time: 12/05/22  2:01 AM  Result Value Ref Range   Sodium 133 (L) 135 - 145 mmol/L   Potassium 4.4 3.5 - 5.1 mmol/L   Chloride 97 (L) 98 - 111 mmol/L   CO2 26 22 - 32  mmol/L   Glucose, Bld 142 (H) 70 - 99 mg/dL    Comment: Glucose reference range applies only to samples taken after fasting for at least 8 hours.   BUN 16 6 - 20 mg/dL   Creatinine, Ser 0.89 0.61 - 1.24 mg/dL   Calcium 9.0 8.9 - 10.3 mg/dL   GFR, Estimated >60 >60 mL/min    Comment: (NOTE) Calculated using the CKD-EPI Creatinine Equation (2021)    Anion gap 10 5 - 15    Comment: Performed at [facility] [address], [place], [place] [address]  Ammonia     Status: Abnormal   Collection Time: 12/05/22  2:01 AM  Result Value Ref Range   Ammonia 125 (H) 9 - 35 umol/L    Comment: Performed at [facility], [place] [address], [place], [place] [address]  Glucose, capillary     Status: Abnormal   Collection Time: 12/05/22  3:01 AM  Result Value Ref Range   Glucose-Capillary 136 (H) 70 - 99 mg/dL    Comment: Glucose reference range applies only to samples taken after fasting for at least 8 hours.  Glucose, capillary     Status: Abnormal   Collection Time: 12/05/22  7:17 AM  Result Value Ref Range   Glucose-Capillary 113 (H) 70 - 99 mg/dL    Comment: Glucose reference range applies only to samples taken after fasting for at least 8 hours.  Glucose, capillary     Status: Abnormal   Collection Time: 12/05/22 11:06 AM  Result Value Ref Range   Glucose-Capillary 132 (H) 70 - 99 mg/dL    Comment: Glucose reference range applies only to samples taken after fasting for at least 8 hours.  Glucose, capillary     Status: Abnormal   Collection Time: 12/05/22  3:08 PM  Result Value Ref Range   Glucose-Capillary 118 (H) 70 - 99 mg/dL    Comment: Glucose reference range applies only to samples taken after fasting for at least 8 hours.  Glucose, capillary     Status: Abnormal   Collection Time: 12/05/22  7:56 PM  Result Value Ref Range   Glucose-Capillary 117 (H) 70 - 99 mg/dL    Comment: Glucose reference range applies only to samples taken after fasting for at least 8 hours.   Glucose, capillary     Status: Abnormal   Collection Time: 12/05/22 11:13 PM  Result Value Ref Range   Glucose-Capillary 104 (H) 70 - 99 mg/dL    Comment: Glucose reference range applies only to samples taken after fasting for at least 8 hours.  CBC     Status: None   Collection Time: 12/06/22  2:24 AM  Result Value Ref Range  WBC 7.0 4.0 - 10.5 K/uL   RBC 4.99 4.22 - 5.81 MIL/uL   Hemoglobin 13.8 13.0 - 17.0 g/dL   HCT 42.1 39.0 - 52.0 %   MCV 84.4 80.0 - 100.0 fL   MCH 27.7 26.0 - 34.0 pg   MCHC 32.8 30.0 - 36.0 g/dL   RDW 13.0 11.5 - 15.5 %   Platelets 317 150 - 400 K/uL   nRBC 0.0 0.0 - 0.2 %    Comment: Performed at [facility], [place] [address], [place], [place] [id number]  Basic metabolic panel     Status: Abnormal   Collection Time: 12/06/22  2:24 AM  Result Value Ref Range   Sodium 131 (L) 135 - 145 mmol/L   Potassium 3.7 3.5 - 5.1 mmol/L   Chloride 99 98 - 111 mmol/L   CO2 22 22 - 32 mmol/L   Glucose, Bld 135 (H) 70 - 99 mg/dL    Comment: Glucose reference range applies only to samples taken after fasting for at least 8 hours.   BUN 16 6 - 20 mg/dL   Creatinine, Ser 0.87 0.61 - 1.24 mg/dL   Calcium 9.0 8.9 - 10.3 mg/dL   GFR, Estimated >60 >60 mL/min    Comment: (NOTE) Calculated using the CKD-EPI Creatinine Equation (2021)    Anion gap 10 5 - 15    Comment: Performed at [facility] [address], [place], [place] [address]  Magnesium     Status: None   Collection Time: 12/06/22  2:24 AM  Result Value Ref Range   Magnesium 2.2 1.7 - 2.4 mg/dL    Comment: Performed at [facility] [address] [place], [place] [address]  Phosphorus     Status: None   Collection Time: 12/06/22  2:24 AM  Result Value Ref Range   Phosphorus 3.6 2.5 - 4.6 mg/dL    Comment: Performed at [facility] [address] [place], [place] [address]  Ammonia     Status: None   Collection Time: 12/06/22  2:24 AM  Result Value Ref Range   Ammonia 17 9 - 35  umol/L    Comment: Performed at [facility], [place] [address] [place], [place] [address]  Phenobarbital level     Status: Abnormal   Collection Time: 12/06/22  2:24 AM  Result Value Ref Range   Phenobarbital 47.5 (H) 15.0 - 40.0 ug/mL    Comment: Performed at [facility] [address], [place], [place] [address]  Glucose, capillary     Status: Abnormal   Collection Time: 12/06/22  3:07 AM  Result Value Ref Range   Glucose-Capillary 153 (H) 70 - 99 mg/dL    Comment: Glucose reference range applies only to samples taken after fasting for at least 8 hours.  Glucose, capillary     Status: Abnormal   Collection Time: 12/06/22  7:14 AM  Result Value Ref Range   Glucose-Capillary 117 (H) 70 - 99 mg/dL    Comment: Glucose reference range applies only to samples taken after fasting for at least 8 hours.    Recent Results (from the past 240 hour(s))  MRSA Next Gen by PCR, Nasal     Status: None   Collection Time: 11/28/22  9:50 PM   Specimen: Nasal Mucosa; Nasal Swab  Result Value Ref Range Status   MRSA by PCR Next Gen NOT DETECTED NOT DETECTED Final    Comment: (NOTE) The GeneXpert  MRSA Assay (FDA approved for NASAL specimens only), is one component of a comprehensive MRSA colonization surveillance program. It is not intended to diagnose MRSA infection nor to guide or monitor treatment for MRSA infections. Test performance is not FDA approved in patients less than 83 years old. Performed at [facility], [place] [address] [place], [place] [address]     Lipid Panel No results for input(s): "CHOL", "TRIG", "HDL", "CHOLHDL", "VLDL", "LDLCALC" in the last 72 hours.  Studies/Results: MR BRAIN W WO CONTRAST  Result Date: 12/04/2022 CLINICAL DATA:  Encephalopathy.  Seizures.  Altered mental status. EXAM: MRI HEAD WITHOUT AND WITH CONTRAST TECHNIQUE: Multiplanar, multiecho pulse sequences of the brain and surrounding structures were obtained without and with intravenous  contrast. CONTRAST:  51mL GADAVIST GADOBUTROL 1 MMOL/ML IV SOLN COMPARISON:  Head CT 04/17/2021.  MRI brain 07/12/2020. FINDINGS: Brain: No acute infarct or hemorrhage. No mass or midline shift. No abnormal enhancement. No hydrocephalus or extra-axial collection. Basilar cisterns are patent. Symmetric size and signal of the hippocampi. Bilateral incomplete hippocampal inversion. 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 Skull and upper cervical spine: Normal marrow signal. Sinuses/Orbits: Trace fluid in the sphenoid sinuses. Orbits are unremarkable. Other: None. IMPRESSION: 1. No acute intracranial process. 2. 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 Electronically Signed   By: Emmit Alexanders M.D.   On: 12/04/2022 20:03   DG Abd Portable 1V  Result Date: 12/04/2022 CLINICAL DATA:  A 27 year old male presents for evaluation of feeding tube. EXAM: PORTABLE ABDOMEN - 1 VIEW COMPARISON:  December 04, 2022 FINDINGS: Feeding tube in the proximal jejunum. Contrast was injected on the floor for this evaluation. There is further distension of the: Than was seen previously. Scattered gas-filled small bowel loops are present. Complete evaluation of the abdomen was not performed for this feeding tube confirmation. On limited assessment there is no acute skeletal process. IMPRESSION: 1. Feeding tube in the proximal jejunum. 2. Further distension of the colon, correlate with signs of ileus and consider follow-up as warranted. Electronically Signed   By: Zetta Bills M.D.   On: 12/04/2022 16:35   DG Abd Portable 1V  Result Date: 12/04/2022 CLINICAL DATA:  Encounter for feeding tube placement. EXAM: PORTABLE ABDOMEN - 1 VIEW COMPARISON:  Radiographs 11/29/2022 and 07/13/2020.  CT 07/12/2020. FINDINGS: 1419 hours. Tip of the feeding tube projects superiorly over the T12-L1 disc  space. The course of the tube suggests that it is looped in the stomach. The visualized bowel gas pattern is nonobstructive. Telemetry leads overlie the lower chest and upper abdomen. Mild convex right thoracolumbar scoliosis. IMPRESSION: Feeding tube tip projects over the T12-L1 disc space, likely looped in the stomach. Electronically Signed   By: Richardean Sale M.D.   On: 12/04/2022 14:35    Medications: Scheduled:  amoxicillin  1,000 mg Per Tube Q8H   Chlorhexidine Gluconate Cloth  6 each Topical Daily   clonazePAM  2 mg Per Tube Q12H   cloNIDine  0.1 mg Per Tube BID   enoxaparin (LOVENOX) injection  40 mg Subcutaneous Daily   Eslicarbazepine Acetate  1,600 mg Per Tube Daily   feeding supplement (PROSource TF20)  60 mL Per Tube Daily   lactulose  30 g Per Tube TID   nicotine  21 mg Transdermal Daily   mouth rinse  15 mL Mouth Rinse 4 times per day   risperiDONE  3 mg Per Tube QHS   And   risperiDONE  1 mg Per Tube Daily   thiamine  100 mg Per Tube Daily   topiramate  50 mg Oral BID   Or   topiramate  50 mg Per Tube BID   Continuous:  dexmedetomidine (PRECEDEX) IV infusion 0.3 mcg/kg/hr (12/06/22 0700)   feeding supplement (OSMOLITE 1.5 CAL) 65 mL/hr at 12/06/22 0700   promethazine (PHENERGAN) injection (IM or IVPB) Stopped (12/05/22 1225)   MRI w w/o intracranial process (3/13) IMPRESSION: 1. No acute intracranial process. 2. 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  Assessment: 27 year old male with 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 Has been admitted previously for similar clinical  picture. 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   On exam patient was somnolent but much more alert and almost completely oriented (had some confusion about what month it is). He was able to identify his uncle and was joking around with him. His pupils were no longer pinpoint as observed yesterday. His mental status has significantly improved compared to the previous days.   Recommendations: - Anticonvulsant regimen:  - continue topiramate 50 mg BID - continue his home eslicarbazepine - Psychiatry is consulting and managing his antipsychotic regimen. Appreciate their recommendations towards titrating the patient towards a stable, non-agitated and preferably non-sedated state.  - Continue to monitor and manage agitation: Appreciate psychiatry input.  - Given concern for possible autoimmune encephalopathy, would obtain MRI brain w/wo contrast. Will need to be sedated for MRI. Compare for stability relative to prior MRI from 2021.  - ESR 8, CRP 2.4 - Anti-NMDA receptor autoantibody negative - ammonia level 42 > 125 > 17 - neurology will sign-off   LOS: 9 days   Camelia Phenes, MD [facility] (PGY-1) 12/06/2022 8:09 AM  NEUROHOSPITALIST ADDENDUM Performed a face to face diagnostic evaluation.   I have reviewed the contents of history and physical exam as documented by PA/ARNP/Resident and agree with above documentation.  I have discussed and formulated the above plan as documented. Edits to the note have been made as needed.  Impression/Key exam findings/Plan: no seizures on topamax and aptiom. Waking up more and engaging with uncle at bedside and redirectable.  We will signoff. Please feel free to contact us with any questions or concerns. He should follow up with neurology outpatient.  Donnetta Simpers, MD [facility] RV:[phone]   If 7pm to 7am, please call on call as listed on AMION.

## 2022-12-06 NOTE — Progress Notes (Signed)
NAME:  Dylan Moon, MRN:  [id number], DOB:  Dec 12, 1995, LOS: 9 ADMISSION DATE:  11/26/2022, CONSULTATION DATE:  11/27/2022 REFERRING MD:  Dr. Gwendlyn Deutscher , CHIEF COMPLAINT: Severe agitation   History of Present Illness:  Dylan Moon is a 27 year old male with past medical history significant for seizures, ADHD, bipolar, schizoaffective disease, scoliosis, and medical noncompliance who presented to the emergency department 3/5 for seizures, felt secondary to medication noncompliance.  Patient was admitted per family medicine teaching service with neurology consultation.  Mid morning of 3/6 patient progressively became more violent and agitated towards staff resulting in security being called, application of violent restraints, and eventually addition of chemical restraints as well. Given need for close monitoring of mentation and airway after chemical restraints PCCM was consulted    Pertinent  Medical History  Seizures, ADHD, bipolar, schizoaffective disease, scoliosis, and medical noncompliance  Significant Hospital Events: Including procedures, antibiotic start and stop dates in addition to other pertinent events   3/5 presented to ED after 1.5-minute tonic-clonic seizure per family 3/6 severely agitated and combative towards staff resulting in both violent and chemical restraints  3/8 continued severe agitation with escalating behavior and requiring multiple sedating medications until last night, calm this morning  3/11 No acute issues overnight, required one dose of versed for agitation  3/13 MRI non-acute. Started on precedex.  3/15 Alert and tearful this am am but interactive and cooperative   Interim History / Subjective:  Wants to call his mom this morning  Off Precedex   Objective   Blood pressure 116/77, pulse 65, temperature (!) 97.1 F (36.2 C), temperature source Axillary, resp. rate (!) 21, weight 84.5 kg, SpO2 98 %.        Intake/Output Summary (Last 24 hours) at  12/06/2022 0742 Last data filed at 12/06/2022 0700 Gross per 24 hour  Intake 1982.34 ml  Output 1375 ml  Net 607.34 ml    Filed Weights   12/04/22 1117 12/05/22 0704 12/06/22 0500  Weight: 85.8 kg 84.9 kg 84.5 kg   Physical Exam   General: Acute ill appearing adult male lying in bed in bilateral wrist restraints, in NAD HEENT: [place]/AT, MM pink/moist, PERRL,  Neuro: Alert and orientated to self and place, confused to situation, non-focal  CV: s1s2 regular rate and rhythm, no murmur, rubs, or gallops,  PULM:  Clear to auscultation, no increased work of breathing, no added breath sounds GI: soft, bowel sounds active in all 4 quadrants, non-tender, non-distended, tolerating TF Extremities: warm/dry, no edema  Skin: no rashes or lesions  Resolved Hospital Problem list   AKI  Assessment & Plan:  Epilepsy with longstanding seizure history, vagal nerve stimulator. -MRI non-acute VPA stopped due to increased ammonia leve -Phenobarb DC 3/11 while we allow to slowly clear, level 47.5 3/15 P: Primary management per neurology  Maintain neuro protective measures Nutrition and bowel regiment  Seizure precautions  AEDs per neurology, continue Topamax and Esilcarbazepine Aspirations precautions  Stop Precedex   History of medication noncompliance Acute psychosis with aggressive behavior Bipolar disorder PTSD hx ADHD hx -His previous outpatient diagnoses of ADHD and anxiety do not fully explain his presentation, which appears to be schizoaffective disorder, bipolar disorder, or post-ictal psychosis -Severe aggression and agitation has been very difficult to control initially required benzos, anti-psychotics, precedex, ketamine, and benadryl -Patient was started on Invega IM in 2021 for a similar episode and family percieved the best benefit from this. Will defer to Psychiatry.  P: Appreciate psychiatry assistance Change  Clonidine dosing to reflect appropriate taper   Stop Precedex   Continue safety sitter Try and remove restraints today  Encourage oral intake  Start to taper Klonopin  Start Melatonin  Continue Risperidone   Dental infection -Mother informed us today he had been started on amoxicillin for dental infection but did not finish course. P: Continue Amoxicillin with stop date in place    At risk malnutrition due to decreased oral intake  P: Continue TF as mentation improves encourage oral intake, once able to safely take orals will remove cortrack   Best Practice (right click and "Reselect all SmartList Selections" daily)   Diet/type: NPO until mental status improves - TF DVT prophylaxis: LMWH GI prophylaxis: N/A Lines: N/A Foley:  External cath in place  Code Status:  full code Last date of multidisciplinary goals of care discussion: No family at bedside AM 3/15, update on arrival   Critical care time:    CRITICAL CARE Performed by: Sander Speckman D. Harris   Total critical care time: 38 minutes  Critical care time was exclusive of separately billable procedures and treating other patients.  Critical care was necessary to treat or prevent imminent or life-threatening deterioration.  Critical care was time spent personally by me on the following activities: development of treatment plan with patient and/or surrogate as well as nursing, discussions with consultants, evaluation of patient's response to treatment, examination of patient, obtaining history from patient or surrogate, ordering and performing treatments and interventions, ordering and review of laboratory studies, ordering and review of radiographic studies, pulse oximetry and re-evaluation of patient's condition.  [name] D. Harris, NP-C  Pulmonary & Critical Care Personal contact information can be found on Amion  If no contact or response made please call [phone] 12/06/2022, 7:43 AM

## 2022-12-06 NOTE — Progress Notes (Signed)
[facility] ADULT ICU REPLACEMENT PROTOCOL   The patient does apply for the [facility] Adult ICU Electrolyte Replacment Protocol based on the criteria listed below:   1.Exclusion criteria: TCTS, ECMO, Dialysis, and Myasthenia Gravis patients 2. Is GFR >/= 30 ml/min? Yes.    Patient's GFR today is >60 3. Is SCr </= 2? Yes.   Patient's SCr is 0.87 mg/dL 4. Did SCr increase >/= 0.5 in 24 hours? No. 5.[name] weight >40kg  Yes.   6. Abnormal electrolyte(s): Potassium 3.7  7. Electrolytes replaced per protocol 8.  Call MD STAT for K+ </= 2.5, Phos </= 1, or Mag </= 1 Physician:  Dr. Glendon Axe A Raye Slyter 12/06/2022 6:31 AM
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
## 2022-12-07 ENCOUNTER — Inpatient Hospital Stay (HOSPITAL_COMMUNITY): Payer: Medicaid Other

## 2022-12-07 DIAGNOSIS — F3163 Bipolar disorder, current episode mixed, severe, without psychotic features: Secondary | ICD-10-CM

## 2022-12-07 LAB — GLUCOSE, CAPILLARY
Glucose-Capillary: 123 mg/dL — ABNORMAL HIGH (ref 70–99)
Glucose-Capillary: 128 mg/dL — ABNORMAL HIGH (ref 70–99)
Glucose-Capillary: 141 mg/dL — ABNORMAL HIGH (ref 70–99)

## 2022-12-07 LAB — PROLACTIN: Prolactin: 40.3 ng/mL — ABNORMAL HIGH (ref 3.6–31.5)

## 2022-12-07 MED ORDER — NYSTATIN 100000 UNIT/ML MT SUSP
5.0000 mL | Freq: Four times a day (QID) | OROMUCOSAL | Status: DC
  Filled 2022-12-07: qty 5

## 2022-12-07 MED ORDER — CLONIDINE HCL 0.2 MG PO TABS
0.2000 mg | ORAL_TABLET | Freq: Three times a day (TID) | ORAL | Status: DC
  Administered 2022-12-07 – 2022-12-08 (×3): 0.2 mg
  Filled 2022-12-07 (×2): qty 1

## 2022-12-07 MED ORDER — RISPERIDONE 1 MG PO TABS
1.0000 mg | ORAL_TABLET | Freq: Two times a day (BID) | ORAL | Status: DC
  Administered 2022-12-07 – 2022-12-08 (×2): 1 mg via ORAL
  Filled 2022-12-07: qty 2
  Filled 2022-12-07 (×2): qty 1

## 2022-12-07 MED ORDER — ONDANSETRON HCL 4 MG/2ML IJ SOLN
INTRAMUSCULAR | Status: AC
  Filled 2022-12-07: qty 2

## 2022-12-07 MED ORDER — NYSTATIN 100000 UNIT/ML MT SUSP: 5.0000 mL | Freq: Four times a day (QID) | OROMUCOSAL | Status: DC

## 2022-12-07 MED ORDER — CLONIDINE HCL 0.2 MG PO TABS: 0.2000 mg | ORAL_TABLET | Freq: Two times a day (BID) | ORAL | Status: DC

## 2022-12-07 MED ORDER — CLONIDINE HCL 0.2 MG PO TABS: 0.2000 mg | ORAL_TABLET | Freq: Every day | ORAL | Status: DC

## 2022-12-07 MED ORDER — CLONIDINE HCL 0.2 MG PO TABS
0.2000 mg | ORAL_TABLET | Freq: Three times a day (TID) | ORAL | Status: DC
  Filled 2022-12-07: qty 1

## 2022-12-07 MED ORDER — RISPERIDONE 0.5 MG PO TABS: 1.0000 mg | ORAL_TABLET | Freq: Two times a day (BID) | ORAL | Status: DC

## 2022-12-07 MED ORDER — NYSTATIN 100000 UNIT/ML MT SUSP
5.0000 mL | Freq: Four times a day (QID) | OROMUCOSAL | Status: DC
  Administered 2022-12-07 – 2022-12-08 (×2): 500000 [IU] via OROMUCOSAL
  Filled 2022-12-07 (×3): qty 5

## 2022-12-07 MED ORDER — AMOXICILLIN 400 MG/5ML PO SUSR
1000.0000 mg | Freq: Three times a day (TID) | ORAL | Status: DC
  Administered 2022-12-07 – 2022-12-08 (×2): 1000 mg
  Filled 2022-12-07 (×5): qty 12.5

## 2022-12-07 MED ORDER — METOCLOPRAMIDE HCL 5 MG/ML IJ SOLN
10.0000 mg | Freq: Four times a day (QID) | INTRAMUSCULAR | Status: DC
  Administered 2022-12-07 – 2022-12-08 (×4): 10 mg via INTRAVENOUS
  Filled 2022-12-07 (×4): qty 2

## 2022-12-07 MED ORDER — ONDANSETRON HCL 4 MG/2ML IJ SOLN
4.0000 mg | Freq: Four times a day (QID) | INTRAMUSCULAR | Status: DC | PRN
  Administered 2022-12-07 – 2022-12-08 (×2): 4 mg via INTRAVENOUS
  Filled 2022-12-07 (×2): qty 2

## 2022-12-07 NOTE — Evaluation (Addendum)
Physical Therapy Evaluation Patient Details Name: Dylan Moon MRN: [id number] DOB: 1996/05/04 Today's Date: 12/07/2022  History of Present Illness  Pt is a 27 y.o. male who presented 11/26/22 with tonic clonic seizure due to medication noncompliance. 3/6 became violent/aggressive and restrained (including requiring chemical restraints). MRI non-acute; PMH: schizoaffective disorder, Bipolar 1, depression, ADHD, seizures, scoliosis  Clinical Impression   Pt admitted secondary to problem above with deficits below. PTA patient was independent with all mobility. He uses a shower chair in the shower due to h/o seizure when showering.  Pt currently requires up to moderate assistance to maintain his balance when walking. Expect good progress as he mobilizes more and should not need an assistive device and may not need OPPT pending progress.  Anticipate patient will benefit from PT to address problems listed below.Will continue to follow acutely to maximize functional mobility independence and safety.          Recommendations for follow up therapy are one component of a multi-disciplinary discharge planning process, led by the attending physician.  Recommendations may be updated based on patient status, additional functional criteria and insurance authorization.  Follow Up Recommendations Outpatient PT      Assistance Recommended at Discharge Frequent or constant Supervision/Assistance  Patient can return home with the following  A little help with walking and/or transfers;A little help with bathing/dressing/bathroom;Assistance with cooking/housework;Direct supervision/assist for medications management;Direct supervision/assist for financial management;Assist for transportation;Help with stairs or ramp for entrance    Equipment Recommendations None recommended by PT  Recommendations for Other Services       Functional Status Assessment Patient has had a recent decline in their functional status  and demonstrates the ability to make significant improvements in function in a reasonable and predictable amount of time.     Precautions / Restrictions Precautions Precautions: Fall Restrictions Weight Bearing Restrictions: No      Mobility  Bed Mobility Overal bed mobility: Needs Assistance Bed Mobility: Supine to Sit, Sit to Supine     Supine to sit: Supervision Sit to supine: Supervision   General bed mobility comments: for safety due to impulsivity and multiple lines in ICU    Transfers Overall transfer level: Needs assistance Equipment used: None Transfers: Sit to/from Stand Sit to Stand: Min assist           General transfer comment: coming to stand slight imbalance; with prolonged standing incr imbalance posteriorly with up to mod assist to recover    Ambulation/Gait Ambulation/Gait assistance: Mod assist Gait Distance (Feet): 150 Feet Assistive device: None Gait Pattern/deviations: Step-through pattern, Wide base of support, Decreased stride length, Staggering right, Drifts right/left   Gait velocity interpretation: 1.31 - 2.62 ft/sec, indicative of limited community ambulator   General Gait Details: pt required up to mod assist to recover balance with staggering to rt; improved as he walked farther  [employer] Mobility    Modified Rankin (Stroke Patients Only)       Balance Overall balance assessment: Needs assistance Sitting-balance support: No upper extremity supported, Feet supported Sitting balance-Leahy Scale: Good     Standing balance support: No upper extremity supported Standing balance-Leahy Scale: Poor    Vc to incr width of stance to improve balance, but with posterior imbalance persisting. Worked on marching in place with gradual improvement and able to progress to gait.  Pertinent Vitals/Pain Pain Assessment Pain Assessment: No/denies pain    Home Living  Family/patient expects to be discharged to:: Private residence Living Arrangements: Spouse/significant other (lives with girlfriend; ?stay with mother) Available Help at Discharge: Family;Available 24 hours/day (potentially per pt) Type of Home: Apartment Home Access: Stairs to enter Entrance Stairs-Rails: Psychiatric nurse of Steps: flight   Home Layout: One level Home Equipment: Electronics engineer Comments: pt reports girlfrient works from home for [employer]; mother works as a Quarry manager and pt thinks she will take time off work to take care of him    Prior Function Prior Level of Function : Independent/Modified Independent               ADLs Comments: uses seat in shower because he has had a seizure in the shower previously     Hand Dominance   Dominant Hand: Right    Extremity/Trunk Assessment   Upper Extremity Assessment Upper Extremity Assessment: Defer to OT evaluation    Lower Extremity Assessment Lower Extremity Assessment: Overall WFL for tasks assessed    Cervical / Trunk Assessment Cervical / Trunk Assessment: Normal  Communication   Communication: No difficulties  Cognition Arousal/Alertness: Awake/alert Behavior During Therapy: Restless (pulling at mitts; elated mood) Overall Cognitive Status: Impaired/Different from baseline Area of Impairment: Memory, Safety/judgement, Awareness                     Memory:  (unable to recall home set-up at his mother's)   Safety/Judgement: Decreased awareness of safety, Decreased awareness of deficits Awareness: Intellectual   General Comments: wants to go home today, even after seeing how off-balance he was on his feet.        General Comments      Exercises     Assessment/Plan    PT Assessment Patient needs continued PT services  PT Problem List Decreased balance;Decreased mobility;Decreased cognition;Decreased knowledge of use of DME;Decreased safety awareness       PT  Treatment Interventions DME instruction;Gait training;Stair training;Functional mobility training;Therapeutic activities;Therapeutic exercise;Balance training;Cognitive remediation;Patient/family education    PT Goals (Current goals can be found in the Care Plan section)  Acute Rehab PT Goals Patient Stated Goal: go home soon PT Goal Formulation: With patient Time For Goal Achievement: 12/21/22 Potential to Achieve Goals: Good    Frequency Min 3X/week     Co-evaluation               AM-PAC PT "6 Clicks" Mobility  Outcome Measure Help needed turning from your back to your side while in a flat bed without using bedrails?: None Help needed moving from lying on your back to sitting on the side of a flat bed without using bedrails?: A Little Help needed moving to and from a bed to a chair (including a wheelchair)?: A Lot Help needed standing up from a chair using your arms (e.g., wheelchair or bedside chair)?: A Little Help needed to walk in hospital room?: A Lot Help needed climbing 3-5 steps with a railing? : A Lot 6 Click Score: 16    End of Session Equipment Utilized During Treatment: Gait belt Activity Tolerance: Patient tolerated treatment well Patient left: in bed;with call bell/phone within reach;with nursing/sitter in room;with restraints reapplied (bil mitts) Nurse Communication: Mobility status PT Visit Diagnosis: Unsteadiness on feet (R26.81);Difficulty in walking, not elsewhere classified (R26.2)    Time: [id number] PT Time Calculation (min) (ACUTE ONLY): 20 min   Charges:   PT Evaluation $PT Eval Low Complexity: 1  [place]  Office [phone]   Rexanne Mano 12/07/2022, 10:56 AM

## 2022-12-07 NOTE — Progress Notes (Signed)
SLP Cancellation Note  Patient Details Name: Dylan Moon MRN: [id number] DOB: 1995/11/08   Cancelled treatment:       Reason Eval/Treat Not Completed:  (pt receiving bath presently) will follow up for swallowing intervention as RN reports pt eager for diet upgrade if able   Hayden Rasmussen MA, CCC-SLP Acute Rehabilitation Services   12/07/2022, 1:42 PM

## 2022-12-07 NOTE — Progress Notes (Signed)
PCCM Progress Note   Notified by nursing of vomiting episode this afternoon, KUB ordered and consistent with illeus. No further vomiting since initial episode so will hold on NG tube placement for not. Start Reglan and continue to monitor QTc daily.  Charisma Charlot D. Harris, NP-C [facility] Personal contact information can be found on Amion  If no contact or response made please call [phone] 12/07/2022, 4:47 PM

## 2022-12-07 NOTE — Progress Notes (Addendum)
Pt had a vomiting episode and pulled out cortrack. MD made aware and zofran given. Pt NPO for the meantime

## 2022-12-07 NOTE — Progress Notes (Signed)
NAME:  PARSON CARSON, MRN:  [id number], DOB:  Apr 28, 1996, LOS: 27 ADMISSION DATE:  11/26/2022, CONSULTATION DATE:  11/27/2022 REFERRING MD:  Dr. Gwendlyn Deutscher , CHIEF COMPLAINT: Severe agitation   History of Present Illness:  Seymore E Motola is a 27 year old male with past medical history significant for seizures, ADHD, bipolar, schizoaffective disease, scoliosis, and medical noncompliance who presented to the emergency department 3/5 for seizures, felt secondary to medication noncompliance.  Patient was admitted per family medicine teaching service with neurology consultation.  Mid morning of 3/6 patient progressively became more violent and agitated towards staff resulting in security being called, application of violent restraints, and eventually addition of chemical restraints as well. Given need for close monitoring of mentation and airway after chemical restraints PCCM was consulted    Pertinent  Medical History  Seizures, ADHD, bipolar, schizoaffective disease, scoliosis, and medical noncompliance  Significant Hospital Events: Including procedures, antibiotic start and stop dates in addition to other pertinent events   3/5 presented to ED after 1.5-minute tonic-clonic seizure per family 3/6 severely agitated and combative towards staff resulting in both violent and chemical restraints  3/8 continued severe agitation with escalating behavior and requiring multiple sedating medications until last night, calm this morning  3/11 No acute issues overnight, required one dose of versed for agitation  3/13 MRI non-acute. Started on precedex.  3/15 Alert and tearful this am am but interactive and cooperative  3/16 No issues overnight, remains out of restraints and off Precedex   Interim History / Subjective:  States he feels well this am More interactive  Objective   Blood pressure 125/68, pulse 76, temperature 98.5 F (36.9 C), temperature source Oral, resp. rate 14, weight 86 kg, SpO2 95 %.         Intake/Output Summary (Last 24 hours) at 12/07/2022 0717 Last data filed at 12/07/2022 0700 Gross per 24 hour  Intake 2122.09 ml  Output 1475 ml  Net 647.09 ml    Filed Weights   12/05/22 0704 12/06/22 0500 12/07/22 0500  Weight: 84.9 kg 84.5 kg 86 kg   Physical Exam   General: Well appearing adult male lying in bed, in NAD HEENT: [place]/AT, MM pink/moist, PERRL,  Neuro: Alert and oriented x3, slightly confused to situation, non-focal  CV: s1s2 regular rate and rhythm, no murmur, rubs, or gallops,  PULM:  Clear to auscultation, no increased work of breathing, no added breath sounds  GI: soft, bowel sounds active in all 4 quadrants, non-tender, non-distended, tolerating oral diet Extremities: warm/dry, no edema  Skin: no rashes or lesions  Resolved Hospital Problem list   AKI  Assessment & Plan:  Epilepsy with longstanding seizure history, vagal nerve stimulator. -MRI non-acute -VPA stopped due to increased ammonia leve -Phenobarb DC 3/11 while we allow to slowly clear, level 47.5 3/15 P: Appreciate neuro assistance Continue Escilcarbazepine and Topamax  Seizure precautions  History of medication noncompliance Acute psychosis with aggressive behavior Bipolar disorder PTSD hx ADHD hx -His previous outpatient diagnoses of ADHD and anxiety do not fully explain his presentation, which appears to be schizoaffective disorder, bipolar disorder, or post-ictal psychosis -Severe aggression and agitation has been very difficult to control initially required benzos, anti-psychotics, precedex, ketamine, and benadryl -Patient was started on Invega IM in 2021 for a similar episode and family percieved the best benefit from this. Will defer to Psychiatry.  P: Appreciate psychiatry assistance  Continue Clonidine with taper in place  Continue Risperidone  AEDs as above  Klonopin taper in place  Safety sitter  QHS Melatonin   Dental infection -Mother informed us today he had been  started on amoxicillin for dental infection but did not finish course. P: Amoxicillin x5 days   At risk malnutrition due to decreased oral intake  P: Stop TFs today and encourage oral intake   Oral thrush P: Nystatin swish and swallow   Best Practice (right click and "Reselect all SmartList Selections" daily)   Diet/type: NPO until mental status improves - TF DVT prophylaxis: LMWH GI prophylaxis: N/A Lines: N/A Foley:  External cath in place  Code Status:  full code Last date of multidisciplinary goals of care discussion: No family at bedside AM 3/15, update on arrival   Critical care time:  NA   Ajah Vanhoose D. Harris, NP-C [facility] Personal contact information can be found on Amion  If no contact or response made please call [phone] 12/07/2022, 7:17 AM
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
## 2022-12-08 ENCOUNTER — Inpatient Hospital Stay (HOSPITAL_COMMUNITY): Payer: Medicaid Other

## 2022-12-08 LAB — MAGNESIUM: Magnesium: 1.9 mg/dL (ref 1.7–2.4)

## 2022-12-08 LAB — COMPREHENSIVE METABOLIC PANEL
ALT: 59 U/L — ABNORMAL HIGH (ref 0–44)
AST: 44 U/L — ABNORMAL HIGH (ref 15–41)
Albumin: 4.1 g/dL (ref 3.5–5.0)
Alkaline Phosphatase: 52 U/L (ref 38–126)
Anion gap: 10 (ref 5–15)
BUN: 14 mg/dL (ref 6–20)
CO2: 24 mmol/L (ref 22–32)
Calcium: 9.7 mg/dL (ref 8.9–10.3)
Chloride: 98 mmol/L (ref 98–111)
Creatinine, Ser: 0.89 mg/dL (ref 0.61–1.24)
GFR, Estimated: >60 mL/min (ref >60)
Glucose, Bld: 135 mg/dL — ABNORMAL HIGH (ref 70–99)
Potassium: 3.8 mmol/L (ref 3.5–5.1)
Sodium: 132 mmol/L — ABNORMAL LOW (ref 135–145)
Total Bilirubin: 0.4 mg/dL (ref 0.3–1.2)
Total Protein: 7.6 g/dL (ref 6.5–8.1)

## 2022-12-08 LAB — CBC
HCT: 45 % (ref 39.0–52.0)
Hemoglobin: 14.9 g/dL (ref 13.0–17.0)
MCH: 27.8 pg (ref 26.0–34.0)
MCHC: 33.1 g/dL (ref 30.0–36.0)
MCV: 84 fL (ref 80.0–100.0)
Platelets: 429 10*3/uL — ABNORMAL HIGH (ref 150–400)
RBC: 5.36 MIL/uL (ref 4.22–5.81)
RDW: 13 % (ref 11.5–15.5)
WBC: 6.6 10*3/uL (ref 4.0–10.5)
nRBC: 0 % (ref 0.0–0.2)

## 2022-12-08 LAB — AMMONIA: Ammonia: 39 umol/L — ABNORMAL HIGH (ref 9–35)

## 2022-12-08 LAB — GLUCOSE, CAPILLARY: Glucose-Capillary: 118 mg/dL — ABNORMAL HIGH (ref 70–99)

## 2022-12-08 LAB — BRAIN NATRIURETIC PEPTIDE: B Natriuretic Peptide: 4 pg/mL (ref 0.0–100.0)

## 2022-12-08 LAB — TSH: TSH: 5.234 u[IU]/mL — ABNORMAL HIGH (ref 0.350–4.500)

## 2022-12-08 MED ORDER — AMOXICILLIN 400 MG/5ML PO SUSR
1000.0000 mg | Freq: Three times a day (TID) | ORAL | Status: DC
  Filled 2022-12-08 (×2): qty 12.5

## 2022-12-08 MED ORDER — ESLICARBAZEPINE ACETATE 800 MG PO TABS: 1600.0000 mg | ORAL_TABLET | Freq: Every day | ORAL | Status: DC

## 2022-12-08 MED ORDER — RISPERIDONE 1 MG PO TABS: 1.0000 mg | ORAL_TABLET | Freq: Two times a day (BID) | ORAL | 0 refills | Status: DC

## 2022-12-08 MED ORDER — THIAMINE MONONITRATE 100 MG PO TABS: 100.0000 mg | ORAL_TABLET | Freq: Every day | ORAL | Status: DC

## 2022-12-08 MED ORDER — NYSTATIN 100000 UNIT/ML MT SUSP: 5.0000 mL | Freq: Four times a day (QID) | OROMUCOSAL | 0 refills | Status: AC

## 2022-12-08 MED ORDER — TOPIRAMATE 50 MG PO TABS: 50.0000 mg | ORAL_TABLET | Freq: Two times a day (BID) | ORAL | 0 refills | Status: DC

## 2022-12-08 MED ORDER — MELATONIN 3 MG PO TABS: 3.0000 mg | ORAL_TABLET | Freq: Every day | ORAL | Status: DC

## 2022-12-08 MED ORDER — AMOXICILLIN 400 MG/5ML PO SUSR: 1000.0000 mg | Freq: Three times a day (TID) | ORAL | 0 refills | Status: AC

## 2022-12-08 NOTE — Discharge Summary (Signed)
Physician Discharge Summary      Patient ID: Dylan Moon MRN: [id number] DOB/AGE: Feb 13, 1996 27 y.o.  Admit date: 11/26/2022 Discharge date: 12/08/2022  Discharge Diagnoses:   Epilepsy with longstanding seizure history, vagal nerve stimulator. History of medication noncompliance Acute psychosis with aggressive behavior Bipolar disorder PTSD hx ADHD hx Dental infection At risk malnutrition due to decreased oral intake  Oral thrush Ileus   Discharge summary   Dylan Moon is a 27 year old male with past medical history significant for seizures, ADHD, bipolar, schizoaffective disease, scoliosis, and medical noncompliance who presented to the emergency department 3/5 for seizures, felt secondary to medication noncompliance.  Patient was admitted per family medicine teaching service with neurology consultation.   Mid morning of 3/6 patient progressively became more violent and agitated towards staff resulting in security being called, application of violent restraints, and eventually addition of chemical restraints as well. Given need for close monitoring of mentation and airway after chemical restraints PCCM was consulted  Inpatient significant events  3/5 presented to ED after 1.5-minute tonic-clonic seizure per family 3/6 severely agitated and combative towards staff resulting in both violent and chemical restraints  3/8 continued severe agitation with escalating behavior and requiring multiple sedating medications until last night, calm this morning  3/11 No acute issues overnight, required one dose of versed for agitation  3/13 MRI non-acute. Started on precedex.  3/15 Alert and tearful this am am but interactive and cooperative  3/16 No issues overnight, remains out of restraints and off Precedex 3/17 Remains cooperative, complaints and he is now eating and ambulating per baseline. Seen by psychiatry and cleared from their standpoint, IVC paperwork reversed. His mentation  remains childlike with questionable ability to care for self at home therefore a cognitive eval was preformed and SLP observed moderate impairments across the board, likely driven by inattention. Therefore I called patients mother and asked her if she would be by to visit Raciel today and she and Chances significant other  arrived to the hospital a hr later. She states that this is Chances baseline mentation and she is comfortable taking him home. She confirms that she or his significant other will ensure that he takes all the medications as prescribed   Discharge Plan by Active Problems   Epilepsy with longstanding seizure history, vagal nerve stimulator. -MRI non-acute -VPA stopped due to increased ammonia leve -Phenobarb DC 3/11 while we allow to slowly clear, level 47.5 3/15 P: Neuro evaluated during admission have since signed off  Continue Escilcarbazepine and Topamax upon discharge  Seizure precautions    History of medication noncompliance Acute psychosis with aggressive behavior Bipolar disorder PTSD hx ADHD hx -His previous outpatient diagnoses of ADHD and anxiety do not fully explain his presentation, which appears to be schizoaffective disorder, bipolar disorder, or post-ictal psychosis -Severe aggression and agitation has been very difficult to control initially required benzos, anti-psychotics, precedex, ketamine, and benadryl -Patient was started on Invega IM in 2021 Psychiatry felt as if this would not be beneficial moving forward   P: Psychiatry consulted during admit, signed off 3/17 Per Care Everywhere patient has an apt [facility] Psychiatry 12/12/22  Continue oral Risperidone 1mg  BID inpatient at upon discharge  Continue Topamax and Eslicarbazepine upon discharge  Klonopin and clonidine tapers stopped 3/17  Dental infection -Mother informed us today he had been started on amoxicillin for dental infection but did not finish course. P: Finish Amoxicillin course upon  discharge   Oral thrush  P: Nystatin swish and swallow  upon discharge admit    Vomiting with possible ileus - resolved  -One episode of vomiting 3/16 P: Continue short course Reglan during admission   Significant Hospital tests/ studies  3/13 MRI brain - negative   Procedures   None  Culture data/antimicrobials   None    Consults  Neuro  Psychiatry     Discharge Exam: BP (!) 146/92 (BP Location: Left Arm)   Pulse 63   Temp 97.7 F (36.5 C) (Oral)   Resp 18   Wt 86 kg   SpO2 100%   BMI 26.44 kg/m   General: Well appearing adult male lying in bed, in NAD HEENT: [place]/AT, MM pink/moist, PERRL,  Neuro: Alert and oriented x3, non-focal  CV: s1s2 regular rate and rhythm, no murmur, rubs, or gallops,  PULM:  Clear to auscultation, no increased work of breathing, on RA GI: soft, bowel sounds active in all 4 quadrants, non-tender, non-distended, tolerating full liq diet Extremities: warm/dry, no edema  Skin: no rashes or lesions  Labs at discharge   Lab Results  Component Value Date   CREATININE 0.89 12/08/2022   BUN 14 12/08/2022   NA 132 (L) 12/08/2022   K 3.8 12/08/2022   CL 98 12/08/2022   CO2 24 12/08/2022   Lab Results  Component Value Date   WBC 6.6 12/08/2022   HGB 14.9 12/08/2022   HCT 45.0 12/08/2022   MCV 84.0 12/08/2022   PLT 429 (H) 12/08/2022   Lab Results  Component Value Date   ALT 59 (H) 12/08/2022   AST 44 (H) 12/08/2022   ALKPHOS 52 12/08/2022   BILITOT 0.4 12/08/2022   Lab Results  Component Value Date   INR 1.2 07/01/2020   INR 1.1 12/24/2019    Current radiological studies    DG Abd Portable 1V  Result Date: 12/08/2022 CLINICAL DATA:  [id number] Nausea AND vomiting [id number] EXAM: PORTABLE ABDOMEN - 1 VIEW COMPARISON:  12/07/2022 FINDINGS: Slightly less gaseous distension of the small and large bowel compared to prior exam compatible with improvement in the ileus. No obstruction pattern. No abnormal calcifications. No osseous  abnormality. IMPRESSION: Improved ileus pattern. Electronically Signed   By: Jerilynn Mages.  Shick M.D.   On: 12/08/2022 08:28   DG Chest Port 1 View  Result Date: 12/08/2022 CLINICAL DATA:  [id number] SOB (shortness of breath) [id number] EXAM: PORTABLE CHEST 1 VIEW COMPARISON:  11/29/2022 FINDINGS: Feeding tube removed. Stable heart size and vascularity. Lungs remain clear. No focal pneumonia, collapse or consolidation. No effusion or pneumothorax. Neurostimulator again noted on the left. IMPRESSION: No active disease. Electronically Signed   By: Jerilynn Mages.  Shick M.D.   On: 12/08/2022 08:26   DG Abd 1 View  Result Date: 12/07/2022 CLINICAL DATA:  Ileus EXAM: ABDOMEN - 1 VIEW COMPARISON:  12/04/2022 FINDINGS: Similar diffuse gaseous distension of the small and large bowel compatible with ileus. Feeding tube has been removed. No abnormal calcifications. No acute osseous finding. IMPRESSION: Persistent diffuse ileus pattern. Electronically Signed   By: Jerilynn Mages.  Shick M.D.   On: 12/07/2022 15:48    Disposition:        Allergies as of 12/08/2022       Reactions   Keppra [levetiracetam] Other (See Comments)   Irritability   Tramadol Other (See Comments)   Contraindicated with current medications (??)   Vimpat [lacosamide] Other (See Comments)   Causes anger        Medication List     STOP taking these medications    busPIRone  10 MG tablet Commonly known as: BUSPAR   divalproex 500 MG 24 hr tablet Commonly known as: DEPAKOTE ER   escitalopram 10 MG tablet Commonly known as: LEXAPRO   Invega Trinza 546 MG/1.75ML injection Generic drug: Paliperidone Palmitate ER   Nayzilam 5 MG/0.1ML Soln Generic drug: Midazolam   valproic acid 250 MG/5ML solution Commonly known as: DEPAKENE       TAKE these medications    amoxicillin 400 MG/5ML suspension Commonly known as: AMOXIL Take 12.5 mLs (1,000 mg total) by mouth every 8 (eight) hours for 5 doses.   Aptiom 800 MG Tabs Generic drug: Eslicarbazepine  Acetate Take 1,600 mg by mouth daily.   carvedilol 6.25 MG tablet Commonly known as: COREG Take 1 tablet (6.25 mg total) by mouth 2 (two) times daily with a meal.   clonazePAM 0.5 MG disintegrating tablet Commonly known as: KLONOPIN Take 0.5 mg by mouth daily as needed for anxiety.   nystatin 100000 UNIT/ML suspension Commonly known as: MYCOSTATIN Use as directed 5 mLs (500,000 Units total) in the mouth or throat 4 (four) times daily for 3 days.   risperiDONE 1 MG tablet Commonly known as: RISPERDAL Take 1 tablet (1 mg total) by mouth 2 (two) times daily.   topiramate 50 MG tablet Commonly known as: TOPAMAX Take 1 tablet (50 mg total) by mouth 2 (two) times daily.         Follow-up appointment   Psychiatry   Discharge Condition:    good   Signed: Madisan Bice D. Harris 12/08/2022, 4:02 PM  Racquelle Hyser D. Harris, NP-C [facility] Personal contact information can be found on Amion  If no contact or response made please call [phone] 12/08/2022, 4:02 PM

## 2022-12-08 NOTE — Progress Notes (Signed)
Patient was discharged home. Patients mom at bedside to pick up patient. Discharge instructions reviewed with patients mother including new medications and where they can be picked up at. Patients mother verbalized full understanding.

## 2022-12-08 NOTE — TOC Transition Note (Signed)
Transition of Care [facility]) - CM/SW Discharge Note   Patient Details  Name: Dylan Moon MRN: [id number] Date of Birth: 1995-12-13  Transition of Care [facility]) CM/SW Contact:  Zenon Mayo, RN Phone Number: 12/08/2022, 11:40 AM   Clinical Narrative:    Patient is for dc today, he would like to be set up with outpatient physical therapy, NCM made referral to epic for [facility] st outpatient rehab.  CSW to look into IVC issue.         Patient Goals and CMS Choice      Discharge Placement                         Discharge Plan and Services Additional resources added to the After Visit Summary for                                       Social Determinants of Health (SDOH) Interventions SDOH Screenings   Depression (PHQ2-9): Low Risk  (11/13/2021)  Tobacco Use: High Risk (11/27/2022)     Readmission Risk Interventions     No data to display

## 2022-12-08 NOTE — Progress Notes (Signed)
Speech Language Pathology Treatment: Dysphagia  Patient Details Name: Dylan Moon MRN: [id number] DOB: 16-Mar-1996 Today's Date: 12/08/2022 Time: [id number] SLP Time Calculation (min) (ACUTE ONLY): 15 min  Assessment / Plan / Recommendation Clinical Impression  Pt was seen for dysphagia treatment. He was alert and cooperative during the session, but with intermittent agitation due to pt's mother not responding immediately on Facetime and also due to the SLP explaining that she incapable of discharging him from the hospital. Pt demonstrated some impulsivity throughout the session, taking large bites and talking throughout intake of most boluses, but despite these behaviors he tolerated regular texture solids, dual consistency boluses (i.e., peaches in juice), and thin liquids via straw without overt s/s of aspiration. Mastication was [facility] and oral clearance was adequate. A regular texture diet with thin liquids is recommended at this time and SLP will follow briefly to ensure tolerance.    HPI HPI: Mikie E Reavis is a 27 year old male with past medical history significant for seizures, ADHD, bipolar, schizoaffective disease, scoliosis, and medical noncompliance who presented to the emergency department 3/5 for seizures, felt secondary to medication noncompliance.  Per chart 3/6 patient became progressively more violent and agitated towards staff resulting in security being called, application of violent restraints, and eventually addition of chemical restraints as well and admitted to ICU. Pt has not required intubation. vomiting episode afternoon of 3/6, KUB ordered and consistent with illeus; repeat revealed on 3/17 improved ileus pattern and Whitney, NP with CCM cleared pt for diet advancement on 3/17.      SLP Plan  Continue with current plan of care      Recommendations for follow up therapy are one component of a multi-disciplinary discharge planning process, led by the attending physician.   Recommendations may be updated based on patient status, additional functional criteria and insurance authorization.    Recommendations  Diet recommendations: Regular;Thin liquid Liquids provided via: Cup;Straw Medication Administration: Whole meds with liquid Supervision: Patient able to self feed Compensations: Slow rate;Small sips/bites Postural Changes and/or Swallow Maneuvers: Seated upright 90 degrees                Oral Care Recommendations: Oral care BID Follow Up Recommendations: No SLP follow up SLP Visit Diagnosis: Dysphagia, unspecified (R13.10) Plan: Continue with current plan of care         Terri Rorrer I. Hardin Negus, [place], [place] Office number [phone]   Horton Marshall  12/08/2022, 9:57 AM

## 2022-12-08 NOTE — Evaluation (Signed)
Speech Language Pathology Evaluation Patient Details Name: Dylan Moon MRN: [id number] DOB: [birth year] Today's Date: 12/08/2022 Time: [id number] SLP Time Calculation (min) (ACUTE ONLY): 38 min  Problem List:  Patient Active Problem List   Diagnosis Date Noted   Bipolar disorder ([place]) 12/04/2022   Agitation 12/03/2022   Suicidal behavior with attempted self-injury ([place]) 11/26/2022   Suicidal ideation 11/26/2022   Schizoaffective disorder, bipolar type ([place]) 08/09/2020   Unspecified intellectual disabilities 08/09/2020   Family discord 03/15/2020   Aggressive behavior 03/15/2020   HCAP (healthcare-associated pneumonia) 12/27/2019   Status epilepticus ([place]) 12/24/2019   Acute respiratory failure with hypoxemia (H[facility])    Seizures ([place]) 07/10/2018   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   Noncompliance with medication regimen 12/11/2017   Seizure ([place]) 05/24/2016   Dehydration, mild 05/24/2016   AKI (acute kidney injury) ([place]) 05/24/2016   Marijuana use 05/24/2016   Past Medical History:  Past Medical History:  Diagnosis Date   ADHD    Bipolar 1 disorder ([place])    Schizoaffective disorder ([place])    Scoliosis    Seizures ([place])    most recent 12/02/17   Past Surgical History:  Past Surgical History:  Procedure Laterality Date   NO PAST SURGERIES     HPI:  Dylan Moon is a 27 year old male with past medical history significant for seizures, ADHD, bipolar, schizoaffective disease, scoliosis, and medical noncompliance who presented to the emergency department 3/5 for seizures, felt secondary to medication noncompliance.  Per chart 3/6 patient became progressively more violent and agitated towards staff resulting in security being called, application of violent restraints, and eventually addition of chemical restraints as well and admitted to ICU. Pt has not required intubation. vomiting episode afternoon of 3/6, KUB ordered and consistent  with illeus; repeat revealed on 3/17 improved ileus pattern and Whitney, NP with CCM cleared pt for diet advancement on 3/17.   Assessment / Plan / Recommendation Clinical Impression  Pt presents with moderate cognitive-linguistic deficits.  Pt was assessed using the COGNISTAT (see below for additional information).  Pt performed below the average range across most competencies.  This may be driven in part by inattention. Pt noted to say "I know it. It's right there," repeatedly and also "I know I ain't this slow." Suspect pt's performance is not consistent with his baseline.  Pt appeared to have a difficult time concentrating, and exhibited moderate-severe impairment on attention task with success only on digit span of 3.  Pt was stimulable, benefiting from category and [facility] cuing on recall task, phonemic cuing on 1 of 4 missed items for confrontational naming, and intermittently from repetition throughout assessment.  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  I suspect at this time, pt would have a difficult time resuming his job functions without support.  Pt will likely need support at this time with ADLs (e.g. medication management).  He is very pleasant and is a good conversation partner, but when challenged deficits become aparrant.  Despite acknowledgement during the test that he was having difficulty with tasks, when asked to reflect on his performance he believes himself to be at baseline.   Pt would benefit from ST while in house to address cognitive linguistic deficits and would likely benefit from ongoing intervention at next level of care.  COGNISTAT: All  subtests are within the average range, except where otherwise specified.  Orientation:  12/12 Attention: 2/8, Moderate-Severe Impairment Comprehension: 4/6, Mild  Impairment Repetition: 8/12, Mild-Moderate Impairment Naming: 4/8, Mild-Moderate Impairment Construction: not assessed Memory: 5/12, Moderate-Severe Impairment Calculations: 2/4, Mild Impairment Similarities: 4/8, Mild Impairment Judgment: 2/6, Moderate Impairment    SLP Assessment  SLP Recommendation/Assessment: Patient needs continued Speech [place] [facility] SLP Visit Diagnosis: Cognitive communication deficit (R41.841)    Recommendations for follow up therapy are one component of a multi-disciplinary discharge planning process, led by the attending physician.  Recommendations may be updated based on patient status, additional functional criteria and insurance authorization.    Follow Up Recommendations   (Continue ST at next level of care)    Assistance Recommended at Discharge  Intermittent Supervision/Assistance  Functional Status Assessment Patient has had a recent decline in their functional status and demonstrates the ability to make significant improvements in function in a reasonable and predictable amount of time.  Frequency and Duration min 2x/week  2 weeks      SLP Evaluation Cognition  Overall Cognitive Status: Impaired/Different from baseline Orientation Level: Oriented X4 Year: 2024 Month: March Day of Week: Correct Attention: Focused;Sustained Focused Attention: Impaired Focused Attention Impairment: Verbal basic Sustained Attention: Impaired Sustained Attention Impairment: Verbal basic Memory: Impaired Memory Impairment: Decreased short term memory Awareness: Impaired Problem Solving: Impaired Problem Solving Impairment: Verbal basic;Verbal complex Executive Function: Reasoning Reasoning: Impaired Reasoning Impairment: Verbal basic       Comprehension  Auditory Comprehension Overall Auditory Comprehension: Appears within functional limits for tasks assessed Commands: Impaired One Step Basic Commands: 75-100% accurate Two Step Basic  Commands: 50-74% accurate Multistep Basic Commands: 0-24% accurate Interfering Components: Attention Visual Recognition/Discrimination Discrimination: Not tested Reading Comprehension Reading Status: Not tested    Expression Expression Primary Mode of Expression: Verbal Verbal Expression Overall Verbal Expression: Impaired Initiation: No impairment Repetition: Impaired Level of Impairment: Sentence level Naming: Impairment Confrontation: Impaired Verbal Errors: Semantic paraphasias Pragmatics: No impairment Interfering Components: Attention Effective Techniques: Phonemic cues   Oral / Motor  Motor Speech Overall Motor Speech: Appears within functional limits for tasks assessed Respiration: Within functional limits Phonation: Normal Resonance: Within functional limits Articulation: Within functional limitis Intelligibility: Intelligible Motor Planning: Witnin functional limits Motor Speech Errors: Not applicable            Celedonio Savage, [place], [place] Office: [phone] 12/08/2022, 2:56 PM
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
## 2022-12-08 NOTE — Progress Notes (Signed)
Psych has cleared pt. IVC rescinded, change of commitment form complete and faxed to magistrate [phone].   Wandra Feinstein, MSW, LCSW [phone] (coverage)

## 2023-01-24 ENCOUNTER — Encounter (HOSPITAL_COMMUNITY): Payer: Self-pay

## 2023-01-24 ENCOUNTER — Other Ambulatory Visit: Payer: Self-pay

## 2023-01-24 ENCOUNTER — Emergency Department (HOSPITAL_COMMUNITY)
Admission: EM | Admit: 2023-01-24 | Discharge: 2023-01-24 | Discharge status: Against Medical Advice | Payer: Medicaid Other | Attending: Emergency Medicine | Admitting: Emergency Medicine

## 2023-01-24 DIAGNOSIS — R569 Unspecified convulsions: Secondary | ICD-10-CM | POA: Diagnosis present

## 2023-01-24 DIAGNOSIS — Z5321 Procedure and treatment not carried out due to patient leaving prior to being seen by health care provider: Secondary | ICD-10-CM | POA: Insufficient documentation

## 2023-01-24 NOTE — ED Triage Notes (Signed)
Pt brought in by EMS after family called d/t a witnessed 2 minute full body seizure. Initially when EMS arrived pt was nonresponsive and NPA was placed, upon arrival to the ED pt is alert and oriented to self/place but not time/situation. It is reported by EMS that there was a recent change to his medications but pt is unsure.

## 2023-01-24 NOTE — ED Provider Notes (Signed)
[place] EMERGENCY DEPARTMENT AT [facility] Provider Note   CSN: [id number] Arrival date & time: 01/24/23  1029     History {Add pertinent medical, surgical, social history, OB history to HPI:1} Chief Complaint  Patient presents with   Seizures    Dylan Moon is a 27 y.o. male.  Patient is brought in by ambulance from home after seizure.  Patient has history of seizure disorder is on zonisamide and Aptiom.  He arrives here very irritable and says he does not need to be here and he wants to be discharged.  He denies any complaints.  The history is provided by the EMS personnel and the patient.  Seizures Seizure activity on arrival: no   Initial focality:  Unable to specify Episode characteristics: generalized shaking   Postictal symptoms: confusion   Return to baseline: yes   Timing:  Once Progression:  Resolved Recent head injury:  No recent head injuries PTA treatment:  None History of seizures: yes        Home Medications Prior to Admission medications   Medication Sig Start Date End Date Taking? Authorizing Provider  APTIOM 800 MG TABS Take 1,600 mg by mouth daily. 07/20/20   Lanae Boast, MD  carvedilol (COREG) 6.25 MG tablet Take 1 tablet (6.25 mg total) by mouth 2 (two) times daily with a meal. Patient not taking: Reported on 11/26/2022 07/20/20 09/13/21  Lanae Boast, MD  clonazePAM (KLONOPIN) 0.5 MG disintegrating tablet Take 0.5 mg by mouth daily as needed for anxiety. Patient not taking: Reported on 11/26/2022 12/12/20   [provider]  risperiDONE (RISPERDAL) 1 MG tablet Take 1 tablet (1 mg total) by mouth 2 (two) times daily. 12/08/22   Janeann Forehand D, NP  topiramate (TOPAMAX) 50 MG tablet Take 1 tablet (50 mg total) by mouth 2 (two) times daily. 12/08/22   Janeann Forehand D, NP      Allergies    Keppra [levetiracetam], Tramadol, and Vimpat [lacosamide]    Review of Systems   Review of Systems  Constitutional:  Negative for fever.   Respiratory:  Negative for shortness of breath.   Cardiovascular:  Negative for chest pain.  Neurological:  Positive for seizures. Negative for headaches.    Physical Exam Updated Vital Signs BP (!) 158/41   Pulse 86   Temp 98 F (36.7 C) (Oral)   Resp 16   Ht 5\' 11"  (1.803 m)   Wt 88 kg   SpO2 100%   BMI 27.06 kg/m  Physical Exam Vitals and nursing note reviewed.  Constitutional:      General: He is not in acute distress.    Appearance: Normal appearance. He is well-developed.  HENT:     Head: Normocephalic and atraumatic.  Eyes:     Conjunctiva/sclera: Conjunctivae normal.  Cardiovascular:     Rate and Rhythm: Normal rate and regular rhythm.     Heart sounds: No murmur heard. Pulmonary:     Effort: Pulmonary effort is normal. No respiratory distress.     Breath sounds: Normal breath sounds.  Abdominal:     Palpations: Abdomen is soft.     Tenderness: There is no abdominal tenderness. There is no guarding or rebound.  Musculoskeletal:        General: No swelling.     Cervical back: Neck supple.  Skin:    General: Skin is warm and dry.     Capillary Refill: Capillary refill takes less than 2 seconds.  Neurological:  General: No focal deficit present.     Mental Status: He is alert.     Sensory: No sensory deficit.     Motor: No weakness.     Gait: Gait normal.     ED Results / Procedures / Treatments   Labs (all labs ordered are listed, but only abnormal results are displayed) Labs Reviewed - No data to display  EKG None  Radiology No results found.  Procedures Procedures  {Document cardiac monitor, telemetry assessment procedure when appropriate:1}  Medications Ordered in ED Medications - No data to display  ED Course/ Medical Decision Making/ A&P   {   Click here for ABCD2, HEART and other calculatorsREFRESH Note before signing :1}                          Medical Decision Making  This patient complains of ***; this involves an  extensive number of treatment Options and is a complaint that carries with it a high risk of complications and morbidity. The differential includes ***  I ordered, reviewed and interpreted labs, which included *** I ordered medication *** and reviewed PMP when indicated. I ordered imaging studies which included *** and I independently    visualized and interpreted imaging which showed *** Additional history obtained from *** Previous records obtained and reviewed *** I consulted *** and discussed lab and imaging findings and discussed disposition.  Cardiac monitoring reviewed, *** Social determinants considered, *** Critical Interventions: ***  After the interventions stated above, I reevaluated the patient and found *** Admission and further testing considered, ***   {Document critical care time when appropriate:1} {Document review of labs and clinical decision tools ie heart score, Chads2Vasc2 etc:1}  {Document your independent review of radiology images, and any outside records:1} {Document your discussion with family members, caretakers, and with consultants:1} {Document social determinants of health affecting pt's care:1} {Document your decision making why or why not admission, treatments were needed:1} Final Clinical Impression(s) / ED Diagnoses Final diagnoses:  None    Rx / DC Orders ED Discharge Orders     None

## 2023-01-24 NOTE — ED Notes (Signed)
Patient becoming increasingly agitated and does not want to stay. Pt removed all monitoring equipment; okay'd per MD Charm Barges to remove IV and that it was okay if patient left. RN at bedside trying to calm and redirect patient, pt is not willing to stay. MD Charm Barges aware

## 2023-01-24 NOTE — ED Notes (Signed)
Patient's gf to bedside. MD Charm Barges made aware and is okay with pt leaving with gf. Pt ambulated with a steady gait

## 2023-01-26 ENCOUNTER — Ambulatory Visit (HOSPITAL_COMMUNITY)
Admission: EM | Admit: 2023-01-26 | Discharge: 2023-01-26 | Disposition: A | Discharge status: Other Acute Inpatient Hospital | Payer: Medicaid Other | Attending: Family Medicine | Admitting: Family Medicine

## 2023-01-26 ENCOUNTER — Emergency Department (HOSPITAL_COMMUNITY): Payer: Medicaid Other

## 2023-01-26 ENCOUNTER — Other Ambulatory Visit: Payer: Self-pay

## 2023-01-26 ENCOUNTER — Encounter (HOSPITAL_COMMUNITY): Payer: Self-pay

## 2023-01-26 ENCOUNTER — Emergency Department (HOSPITAL_COMMUNITY)
Admission: EM | Admit: 2023-01-26 | Discharge: 2023-01-26 | Disposition: A | Discharge status: Other Acute Inpatient Hospital | Payer: Medicaid Other | Attending: Student | Admitting: Student

## 2023-01-26 ENCOUNTER — Ambulatory Visit (HOSPITAL_COMMUNITY)
Admission: EM | Admit: 2023-01-26 | Discharge: 2023-01-27 | Disposition: A | Discharge status: Other Acute Inpatient Hospital | Payer: Medicaid Other | Source: Home / Self Care

## 2023-01-26 DIAGNOSIS — W269XXA Contact with unspecified sharp object(s), initial encounter: Secondary | ICD-10-CM | POA: Diagnosis not present

## 2023-01-26 DIAGNOSIS — S61211A Laceration without foreign body of left index finger without damage to nail, initial encounter: Secondary | ICD-10-CM | POA: Insufficient documentation

## 2023-01-26 DIAGNOSIS — D649 Anemia, unspecified: Secondary | ICD-10-CM | POA: Diagnosis not present

## 2023-01-26 DIAGNOSIS — S61213A Laceration without foreign body of left middle finger without damage to nail, initial encounter: Secondary | ICD-10-CM | POA: Diagnosis not present

## 2023-01-26 DIAGNOSIS — R4689 Other symptoms and signs involving appearance and behavior: Secondary | ICD-10-CM

## 2023-01-26 DIAGNOSIS — S61412A Laceration without foreign body of left hand, initial encounter: Secondary | ICD-10-CM | POA: Insufficient documentation

## 2023-01-26 DIAGNOSIS — Y92002 Bathroom of unspecified non-institutional (private) residence single-family (private) house as the place of occurrence of the external cause: Secondary | ICD-10-CM | POA: Insufficient documentation

## 2023-01-26 DIAGNOSIS — F250 Schizoaffective disorder, bipolar type: Secondary | ICD-10-CM | POA: Insufficient documentation

## 2023-01-26 DIAGNOSIS — F259 Schizoaffective disorder, unspecified: Secondary | ICD-10-CM | POA: Insufficient documentation

## 2023-01-26 DIAGNOSIS — S6992XA Unspecified injury of left wrist, hand and finger(s), initial encounter: Secondary | ICD-10-CM | POA: Diagnosis present

## 2023-01-26 DIAGNOSIS — F19959 Other psychoactive substance use, unspecified with psychoactive substance-induced psychotic disorder, unspecified: Secondary | ICD-10-CM

## 2023-01-26 DIAGNOSIS — F919 Conduct disorder, unspecified: Secondary | ICD-10-CM

## 2023-01-26 DIAGNOSIS — S61401A Unspecified open wound of right hand, initial encounter: Secondary | ICD-10-CM

## 2023-01-26 DIAGNOSIS — D72829 Elevated white blood cell count, unspecified: Secondary | ICD-10-CM | POA: Diagnosis not present

## 2023-01-26 DIAGNOSIS — S61212A Laceration without foreign body of right middle finger without damage to nail, initial encounter: Secondary | ICD-10-CM | POA: Insufficient documentation

## 2023-01-26 DIAGNOSIS — S61419A Laceration without foreign body of unspecified hand, initial encounter: Secondary | ICD-10-CM

## 2023-01-26 LAB — COMPREHENSIVE METABOLIC PANEL
ALT: 22 U/L (ref 0–44)
AST: 25 U/L (ref 15–41)
Albumin: 4.6 g/dL (ref 3.5–5.0)
Alkaline Phosphatase: 52 U/L (ref 38–126)
Anion gap: 14 (ref 5–15)
BUN: 13 mg/dL (ref 6–20)
CO2: 18 mmol/L — ABNORMAL LOW (ref 22–32)
Calcium: 9.9 mg/dL (ref 8.9–10.3)
Chloride: 104 mmol/L (ref 98–111)
Creatinine, Ser: 1.44 mg/dL — ABNORMAL HIGH (ref 0.61–1.24)
GFR, Estimated: >60 mL/min (ref >60)
Glucose, Bld: 145 mg/dL — ABNORMAL HIGH (ref 70–99)
Potassium: 3.7 mmol/L (ref 3.5–5.1)
Sodium: 136 mmol/L (ref 135–145)
Total Bilirubin: 0.4 mg/dL (ref 0.3–1.2)
Total Protein: 7.5 g/dL (ref 6.5–8.1)

## 2023-01-26 LAB — CBC WITH DIFFERENTIAL/PLATELET
Abs Immature Granulocytes: 0 10*3/uL (ref 0.00–0.07)
Basophils Absolute: 0 10*3/uL (ref 0.0–0.1)
Basophils Relative: 0 %
Eosinophils Absolute: 0 10*3/uL (ref 0.0–0.5)
Eosinophils Relative: 0 %
HCT: 44.5 % (ref 39.0–52.0)
Hemoglobin: 14.8 g/dL (ref 13.0–17.0)
Lymphocytes Relative: 4 %
Lymphs Abs: 0.7 10*3/uL (ref 0.7–4.0)
MCH: 27.8 pg (ref 26.0–34.0)
MCHC: 33.3 g/dL (ref 30.0–36.0)
MCV: 83.6 fL (ref 80.0–100.0)
Monocytes Absolute: 0.7 10*3/uL (ref 0.1–1.0)
Monocytes Relative: 4 %
Neutro Abs: 16.1 10*3/uL — ABNORMAL HIGH (ref 1.7–7.7)
Neutrophils Relative %: 92 %
Platelets: 228 10*3/uL (ref 150–400)
RBC: 5.32 MIL/uL (ref 4.22–5.81)
RDW: 12.7 % (ref 11.5–15.5)
WBC: 17.5 10*3/uL — ABNORMAL HIGH (ref 4.0–10.5)
nRBC: 0 % (ref 0.0–0.2)
nRBC: 0 /100 WBC

## 2023-01-26 LAB — RAPID URINE DRUG SCREEN, HOSP PERFORMED
Amphetamines: NOT DETECTED
Barbiturates: POSITIVE — AB
Benzodiazepines: POSITIVE — AB
Cocaine: NOT DETECTED
Opiates: NOT DETECTED
Tetrahydrocannabinol: POSITIVE — AB

## 2023-01-26 LAB — ETHANOL: Alcohol, Ethyl (B): <10 mg/dL (ref <10)

## 2023-01-26 MED ORDER — ACETAMINOPHEN 325 MG PO TABS: 650.0000 mg | ORAL_TABLET | Freq: Four times a day (QID) | ORAL | Status: DC | PRN

## 2023-01-26 MED ORDER — ALUM & MAG HYDROXIDE-SIMETH 200-200-20 MG/5ML PO SUSP: 30.0000 mL | ORAL | Status: DC | PRN

## 2023-01-26 MED ORDER — LORAZEPAM 2 MG/ML IJ SOLN
1.0000 mg | Freq: Once | INTRAMUSCULAR | Status: AC
  Administered 2023-01-26: 1 mg via INTRAMUSCULAR
  Filled 2023-01-26: qty 1

## 2023-01-26 MED ORDER — ZONISAMIDE 100 MG PO CAPS: 300.0000 mg | ORAL_CAPSULE | Freq: Every day | ORAL | Status: DC

## 2023-01-26 MED ORDER — MAGNESIUM HYDROXIDE 400 MG/5ML PO SUSP: 30.0000 mL | Freq: Every day | ORAL | Status: DC | PRN

## 2023-01-26 MED ORDER — NON FORMULARY: 1600.0000 mg | Freq: Every day | Status: DC

## 2023-01-26 MED ORDER — ZONISAMIDE 100 MG PO CAPS
300.0000 mg | ORAL_CAPSULE | Freq: Every day | ORAL | Status: DC
  Administered 2023-01-27: 300 mg via ORAL

## 2023-01-26 MED ORDER — LORAZEPAM 1 MG PO TABS
2.0000 mg | ORAL_TABLET | Freq: Once | ORAL | Status: AC | PRN
  Administered 2023-01-26: 2 mg via ORAL

## 2023-01-26 MED ORDER — CLONAZEPAM 0.25 MG PO TBDP
0.5000 mg | ORAL_TABLET | Freq: Three times a day (TID) | ORAL | Status: DC | PRN
  Administered 2023-01-26 – 2023-01-27 (×4): 0.5 mg via ORAL
  Filled 2023-01-26 (×5): qty 2

## 2023-01-26 MED ORDER — ESLICARBAZEPINE ACETATE 800 MG PO TABS
1600.0000 mg | ORAL_TABLET | Freq: Every day | ORAL | Status: DC
  Administered 2023-01-27: 1600 mg via ORAL

## 2023-01-26 MED ORDER — RISPERIDONE 1 MG PO TABS
1.0000 mg | ORAL_TABLET | Freq: Two times a day (BID) | ORAL | Status: DC
  Administered 2023-01-26: 1 mg via ORAL
  Filled 2023-01-26: qty 1

## 2023-01-26 MED ORDER — LORAZEPAM 1 MG PO TABS
2.0000 mg | ORAL_TABLET | Freq: Once | ORAL | Status: AC | PRN
  Filled 2023-01-26: qty 2

## 2023-01-26 MED ORDER — CARVEDILOL 3.125 MG PO TABS
6.2500 mg | ORAL_TABLET | Freq: Two times a day (BID) | ORAL | Status: DC
  Administered 2023-01-27 (×2): 6.25 mg via ORAL
  Filled 2023-01-26 (×2): qty 2

## 2023-01-26 MED ORDER — RISPERIDONE 2 MG PO TABS
2.0000 mg | ORAL_TABLET | Freq: Two times a day (BID) | ORAL | Status: DC
  Administered 2023-01-26 – 2023-01-27 (×2): 2 mg via ORAL
  Filled 2023-01-26 (×2): qty 1

## 2023-01-26 MED ORDER — TRAZODONE HCL 50 MG PO TABS
50.0000 mg | ORAL_TABLET | Freq: Every evening | ORAL | Status: DC | PRN
  Administered 2023-01-26: 50 mg via ORAL
  Filled 2023-01-26: qty 1

## 2023-01-26 MED ORDER — OLANZAPINE 15 MG PO TBDP
15.0000 mg | ORAL_TABLET | ORAL | Status: AC
  Administered 2023-01-26: 15 mg via ORAL
  Filled 2023-01-26: qty 1

## 2023-01-26 NOTE — ED Notes (Signed)
Pt at phone making multiple calls then hanging phone up.  No conversations noted.

## 2023-01-26 NOTE — ED Provider Notes (Signed)
[place] EMERGENCY DEPARTMENT AT [facility] Provider Note   CSN: [id number] Arrival date & time: 01/26/23  1047     History  Chief Complaint  Patient presents with   Hand Injury    Dylan Moon is a 27 y.o. male.   Hand Injury   27 year old male presents emergency department after psychotic episode.  Patient accompanied by family members who state that patient was awake earlier this morning moving furniture around the house and talking about people trying to find him.  Partner at bedside states that she attempted to call his mother to ask for assistance when patient locked himself in the bathroom.  He subsequently damaged the toilet while in their causing cuts to both of his hands.  Denies trauma to head, loss of consciousness.  Denies any fall.  Denies seizure-like activity.  Family was then called police who escorted patient to [facility] who then rerouted patient to the emergency department.  Patient denies chest pain, shortness of breath, abdominal pain, nausea, vomiting.  Patient with regular use of marijuana with using "laughing gas" last night.  Past medical history significant for seizure, bipolar 1 disorder, ADHD, scoliosis, schizoaffective disorder, marijuana use, aggressive behavior  Home Medications Prior to Admission medications   Medication Sig Start Date End Date Taking? Authorizing Provider  APTIOM 800 MG TABS Take 1,600 mg by mouth daily. 07/20/20   Lanae Boast, MD  carvedilol (COREG) 6.25 MG tablet Take 1 tablet (6.25 mg total) by mouth 2 (two) times daily with a meal. Patient not taking: Reported on 11/26/2022 07/20/20 09/13/21  Lanae Boast, MD  clonazePAM (KLONOPIN) 0.5 MG disintegrating tablet Take 0.5 mg by mouth daily as needed for anxiety. Patient not taking: Reported on 11/26/2022 12/12/20   [provider]  risperiDONE (RISPERDAL) 1 MG tablet Take 1 tablet (1 mg total) by mouth 2 (two) times daily. 12/08/22   Janeann Forehand D, NP  topiramate  (TOPAMAX) 50 MG tablet Take 1 tablet (50 mg total) by mouth 2 (two) times daily. 12/08/22   Janeann Forehand D, NP      Allergies    Keppra [levetiracetam], Tramadol, and Vimpat [lacosamide]    Review of Systems   Review of Systems  All other systems reviewed and are negative.   Physical Exam Updated Vital Signs BP (!) 165/84   Pulse 85   Temp 99.2 F (37.3 C) (Oral)   Resp 16   Ht 5\' 11"  (1.803 m)   Wt 88 kg   SpO2 100%   BMI 27.06 kg/m  Physical Exam Vitals and nursing note reviewed.  Constitutional:      General: He is not in acute distress.    Appearance: He is well-developed.  HENT:     Head: Normocephalic and atraumatic.  Eyes:     Conjunctiva/sclera: Conjunctivae normal.  Cardiovascular:     Rate and Rhythm: Normal rate and regular rhythm.     Heart sounds: No murmur heard. Pulmonary:     Effort: Pulmonary effort is normal. No respiratory distress.     Breath sounds: Normal breath sounds.  Abdominal:     Palpations: Abdomen is soft.     Tenderness: There is no abdominal tenderness.  Musculoskeletal:        General: No swelling.     Cervical back: Neck supple.     Comments: Patient with superficial lacerations noted between his third and fourth metacarpals on his left hand on the dorsal surface as well as on the medial aspect  of his third digit of right hand.  No obvious foreign body appreciated.  Patient is full range of motion of bilateral hands in flexion extension, bilateral wrists, bilateral elbows.  Radial pulses 2+ bilaterally.  No sensory deficits distally.  Skin:    General: Skin is warm and dry.     Capillary Refill: Capillary refill takes less than 2 seconds.  Neurological:     Mental Status: He is alert.  Psychiatric:        Mood and Affect: Mood normal.     ED Results / Procedures / Treatments   Labs (all labs ordered are listed, but only abnormal results are displayed) Labs Reviewed  COMPREHENSIVE METABOLIC PANEL - Abnormal; Notable for the  following components:      Result Value   CO2 18 (*)    Glucose, Bld 145 (*)    Creatinine, Ser 1.44 (*)    All other components within normal limits  RAPID URINE DRUG SCREEN, HOSP PERFORMED - Abnormal; Notable for the following components:   Benzodiazepines POSITIVE (*)    Tetrahydrocannabinol POSITIVE (*)    Barbiturates POSITIVE (*)    All other components within normal limits  CBC WITH DIFFERENTIAL/PLATELET - Abnormal; Notable for the following components:   WBC 17.5 (*)    Neutro Abs 16.1 (*)    All other components within normal limits  ETHANOL    EKG None  Radiology DG Hand Complete Right  Result Date: 01/26/2023 CLINICAL DATA:  Pain EXAM: RIGHT HAND - COMPLETE 3+ VIEW COMPARISON:  None Available. FINDINGS: There is no evidence of fracture or dislocation. There is no evidence of arthropathy or other focal bone abnormality. Soft tissues are unremarkable. IMPRESSION: Negative. Electronically Signed   By: Larose Hires D.O.   On: 01/26/2023 12:20    Procedures .Marland KitchenLaceration Repair  Date/Time: 01/26/2023 2:24 PM  Performed by: Peter Garter, PA Authorized by: Peter Garter, PA   Consent:    Consent obtained:  Verbal   Consent given by:  Patient   Risks, benefits, and alternatives were discussed: yes     Risks discussed:  Infection, need for additional repair, nerve damage, pain, poor cosmetic result, poor wound healing, vascular damage, tendon damage and retained foreign body   Alternatives discussed:  Delayed treatment, observation, referral and no treatment Universal protocol:    Procedure explained and questions answered to patient or proxy's satisfaction: yes     Patient identity confirmed:  Verbally with patient Anesthesia:    Anesthesia method:  None Laceration details:    Location:  Finger   Finger location:  L long finger   Length (cm):  2 Pre-procedure details:    Preparation:  Patient was prepped and draped in usual sterile fashion and imaging obtained  to evaluate for foreign bodies Exploration:    Limited defect created (wound extended): no     Hemostasis achieved with:  Direct pressure   Imaging obtained: x-ray     Imaging outcome: foreign body not noted   Treatment:    Area cleansed with:  Saline   Amount of cleaning:  Standard   Irrigation solution:  Sterile saline   Irrigation volume:  100cc   Irrigation method:  Syringe   Visualized foreign bodies/material removed: no     Debridement:  None   Undermining:  None   Scar revision: no   Skin repair:    Repair method:  Tissue adhesive Approximation:    Approximation:  Close Repair type:    Repair  type:  Simple Post-procedure details:    Dressing:  Open (no dressing)   Procedure completion:  Tolerated well, no immediate complications .Marland KitchenLaceration Repair  Date/Time: 01/26/2023 2:25 PM  Performed by: Peter Garter, PA Authorized by: Peter Garter, PA   Consent:    Consent obtained:  Verbal   Consent given by:  Patient   Risks, benefits, and alternatives were discussed: yes     Risks discussed:  Infection, need for additional repair, nerve damage, poor wound healing, poor cosmetic result, pain, retained foreign body, tendon damage and vascular damage   Alternatives discussed:  No treatment, referral, observation and delayed treatment Universal protocol:    Procedure explained and questions answered to patient or proxy's satisfaction: yes     Patient identity confirmed:  Verbally with patient Anesthesia:    Anesthesia method:  None Laceration details:    Location:  Finger   Finger location:  L index finger   Length (cm):  1.5 Pre-procedure details:    Preparation:  Imaging obtained to evaluate for foreign bodies Exploration:    Limited defect created (wound extended): no     Hemostasis achieved with:  Direct pressure   Imaging obtained: x-ray     Imaging outcome: foreign body not noted     Wound exploration: wound explored through full range of motion and  entire depth of wound visualized     Contaminated: no   Treatment:    Area cleansed with:  Saline   Amount of cleaning:  Standard   Irrigation solution:  Sterile saline   Irrigation volume:  100cc   Irrigation method:  Syringe   Visualized foreign bodies/material removed: no     Debridement:  None   Undermining:  None   Scar revision: no   Skin repair:    Repair method:  Tissue adhesive Approximation:    Approximation:  Close Repair type:    Repair type:  Simple Post-procedure details:    Dressing:  Open (no dressing)   Procedure completion:  Tolerated well, no immediate complications .Marland KitchenLaceration Repair  Date/Time: 01/26/2023 2:25 PM  Performed by: Peter Garter, PA Authorized by: Peter Garter, PA   Consent:    Consent obtained:  Verbal   Consent given by:  Patient   Risks, benefits, and alternatives were discussed: yes     Risks discussed:  Infection, need for additional repair, nerve damage, poor wound healing, poor cosmetic result, pain, retained foreign body, tendon damage and vascular damage   Alternatives discussed:  No treatment, observation, referral and delayed treatment Universal protocol:    Procedure explained and questions answered to patient or proxy's satisfaction: yes     Patient identity confirmed:  Verbally with patient Anesthesia:    Anesthesia method:  None Laceration details:    Location:  Hand   Hand location:  L hand, dorsum   Length (cm):  2 Pre-procedure details:    Preparation:  Imaging obtained to evaluate for foreign bodies and patient was prepped and draped in usual sterile fashion Exploration:    Limited defect created (wound extended): no     Hemostasis achieved with:  Direct pressure   Imaging obtained: x-ray     Imaging outcome: foreign body not noted     Wound exploration: wound explored through full range of motion and entire depth of wound visualized     Contaminated: no   Treatment:    Area cleansed with:  Saline    Amount of cleaning:  Standard  Irrigation solution:  Sterile saline   Irrigation volume:  100cc   Irrigation method:  Syringe   Visualized foreign bodies/material removed: no     Debridement:  None   Undermining:  None   Scar revision: no   Skin repair:    Repair method:  Tissue adhesive Approximation:    Approximation:  Close Repair type:    Repair type:  Simple Post-procedure details:    Dressing:  Open (no dressing)   Procedure completion:  Tolerated well, no immediate complications     Medications Ordered in ED Medications  LORazepam (ATIVAN) injection 1 mg (1 mg Intramuscular Given 01/26/23 1203)    ED Course/ Medical Decision Making/ A&P                             Medical Decision Making Amount and/or Complexity of Data Reviewed Labs: ordered. Radiology: ordered.  Risk Prescription drug management.   This patient presents to the ED for concern of laceration, this involves an extensive number of treatment options, and is a complaint that carries with it a high risk of complications and morbidity.  The differential diagnosis includes fracture, strain/sprain, dislocation, ligamentous/tendinous injury, neurovascular compromise, foreign body retainment   Co morbidities that complicate the patient evaluation  See HPI   Additional history obtained:  Additional history obtained from EMR External records from outside source obtained and reviewed including hospital records   Lab Tests:  I Ordered, and personally interpreted labs.  The pertinent results include: Leukocytosis was adequate for evidence of anemia.  Platelets within range.  Mild decrease in bicarb to 18 but otherwise electrolytes within normal range.  No transaminitis.  Patient is with some elevation in creatinine 1.44 but declining intravenous fluids at this time.  UDS positive for benzodiazepines, THC and barbiturates.  Ethanol level less than 10..   Imaging Studies ordered:  I ordered imaging  studies including right hand x-ray I independently visualized and interpreted imaging which showed no acute abnormalities I agree with the radiologist interpretation   Cardiac Monitoring: / EKG:  The patient was maintained on a cardiac monitor.  I personally viewed and interpreted the cardiac monitored which showed an underlying rhythm of: Sinus   Consultations Obtained:  I requested consultation with attending physician Dr. Posey Rea who was in agreement with treatment plan going forward.    Problem List / ED Course / Critical interventions / Medication management  Cuts on hands I ordered medication including Ativan   Reevaluation of the patient after these medicines showed that the patient improved I have reviewed the patients home medicines and have made adjustments as needed   Social Determinants of Health:  Polysubstance use.  Medical noncompliance   Test / Admission - Considered:  Laceration on hand/finger Vitals signs significant for hypertension with blood pressure 165/84. Otherwise within normal range and stable throughout visit. Laboratory/imaging studies significant for: See above 27 year old male presents emergency department after being seen at behavioral with urgent care for assessment of cuts on his hands after breaking a toilet and cutting his hands on the porcelain.  Areas were very superficial lacerations with 1 appreciated skin avulsion repaired in manner as depicted above after thorough cleansing.  Patient cleared medically after laceration was repaired with pending laboratory studies for behavioral assessment back at behavioral health urgent care.  Treatment plan discussed at length with patient and family and they acknowledge understanding were agreeable to said plan. Worrisome signs and symptoms were discussed  with the patient, and the patient acknowledged understanding to return to the ED if noticed. Patient was stable upon discharge.          Final  Clinical Impression(s) / ED Diagnoses Final diagnoses:  Avulsion of skin of right hand, initial encounter  Laceration of right middle finger without foreign body without damage to nail, initial encounter  Laceration of dorsum of hand    Rx / DC Orders ED Discharge Orders     None         Peter Garter, [place] 01/26/23 1434    Glendora Score, MD 01/26/23 [phone]

## 2023-01-26 NOTE — ED Notes (Signed)
1mg  Ativan wasted with [place] RN from [place] pulled at [employer].

## 2023-01-26 NOTE — ED Notes (Signed)
Pt resting quietly, breathing is even and unlabored.  Pt denies SI, HI, pain and AVH.  Pt animated when talking with this staff, reports he needs to get to [facility] for work.  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  After speaking with this writer pt was on phone yelling,  pt was redirected to lower voice while on phone. Prn agitation medication was given, pt took meds PO without issue.  Pt currently resting with eyes closed, even rise and fall of chest. Will continue to monitor for safety.

## 2023-01-26 NOTE — Discharge Instructions (Addendum)
Go to behavior health urgent care.

## 2023-01-26 NOTE — ED Notes (Signed)
[employer] Called for service of IVC papers.

## 2023-01-26 NOTE — ED Notes (Signed)
Rn called report to Asher Muir at ed charge nurse

## 2023-01-26 NOTE — ED Notes (Signed)
Pt verbalizes he wants to go home, explained to patient since he came in by [employer], he will need to stay over night. Pt took his Risperdal and Klonopin. He is pacing right now but redirectable.

## 2023-01-26 NOTE — ED Notes (Signed)
Pt on phone stating he is going to [place] and [place].  Hung up phone now sitting on side of bed.

## 2023-01-26 NOTE — ED Notes (Signed)
Pt awake and speaking in loud tones at this time.  States these " I'm dealing with life and death.  I need to go."  Pt was able to be redirected by offering medication and a Gatorade.  Will continue to monitor for safety.

## 2023-01-26 NOTE — ED Notes (Signed)
Pt arrived back to [facility] via [employer], patient is being admitted to obs and was walked back to flex with security. Given lunch. Skin assessment complete.

## 2023-01-26 NOTE — ED Notes (Signed)
Provider sent patient to [facility] to get medically cleared due to hitting the   toilet with his hand. States that the patient can come back once he is medically cleared. Rn notified charge nurse Asher Muir . [employer] took patient to the ed per provider request.

## 2023-01-26 NOTE — ED Triage Notes (Signed)
Pt bib [employer] who transported him to [facility] but upon arrival they sent him to this ED for evaluation due to laceration on hands.  Provider is at bedside for triage.

## 2023-01-26 NOTE — ED Provider Notes (Signed)
IVC documentation uploaded and E filed 3:13 PM. Envelope # [id number].  Phone magistrate's office twice.  Was notified on second call that they are short staffed and the next magistrate would not be in until after 530 and they would have to handle returning the IVC document so patient can be served.  Notified providers via secure chat current status of IVC and included on shift report.

## 2023-01-26 NOTE — ED Provider Notes (Signed)
Behavioral Health Urgent Care Medical Screening Exam  Patient Name: Dylan Moon MRN: [id number] Date of Evaluation: 01/26/23 Chief Complaint:  "I've got to get out of here" Diagnosis:  Final diagnoses:  Schizoaffective disorder, unspecified type (HCC)  Aggressive behavior of adult  Psychoactive substance-induced psychosis (HCC)     Dylan Moon 27 y.o., male patient presented to [facility] as a walk in accompanied by law enforcement and his mother due to erratic behavior this morning, talking to himself, patient broke a porcelain toilet at home with his bare hands and is actively bleeding, inhaling household cleaning products, and smoking marijuana. Patient's mother, Dylan Moon [phone], provided history.  Terance Clydene Pugh, 27 y.o., male patient seen face to face by this provider, consulted with Dr. Viviano Simas; and chart reviewed on 01/26/23.    On evaluation Dylan Moon is sitting in the interview room responding to internal stimuli as he is laughing and talking to himself.  On evaluation patient has superficial lacerations bilaterally on both hands with a slightly deeper laceration on the dorsum of left hand.  Nursing staff cleaned wounds and bleeding is currently controlled. Patient was asked to make a fist however was unable to.  Patient is obviously under the influence of a substance and is having difficulty following simple commands.  Unable to tell this writer why the police brought him here however is able to state that" I got to get out of here" patient begins to stand up and is following commands to have a seat in the interview room while this writer attempted to speak with him.  So security is on standby.  Patient attempted to elope by pushing on the door in the hallway however security was able to get him back into the interview room.  Patient can be heard yelling that he wants to leave and banging on the door.  This Clinical research associate contacted EDP on-call at [facility] and  advised of patient's injuries and that patient warrants medical clearance in order to be admitted here at [facility] given the mechanism of injury.  Spoke with Dr. Rosalia Hammers at [facility], ED she agreed to accept the patient and advised patient can return here to [facility] once medically cleared.    Flowsheet Row ED from 01/26/2023 in [facility] ED from 01/24/2023 in [facility] Emergency Department at [facility] ED to Hosp-Admission (Discharged) from 11/26/2022 in [facility] 60M KIDNEY UNIT  C-SSRS RISK CATEGORY No Risk No Risk No Risk       Psychiatric Specialty Exam  Presentation  General Appearance:Bizarre  Eye Contact:Fleeting  Speech:Slow  Speech Volume:Decreased  Handedness:-- (Unable to assess)   Mood and Affect  Mood: Labile  Affect: Congruent   Thought Process  Thought Processes: Disorganized  Descriptions of Associations:Tangential  Orientation:Other (comment) (unable to assess)  Thought Content:Other (comment) (Unable to assess)    Hallucinations:Other (comment) (Appears to be responding to internal stimuli)  Ideas of Reference:Paranoia  Suicidal Thoughts:-- (Unable to assess- uncooperative with exam)  Homicidal Thoughts:-- (Unable to assess- uncooperative with exam)   Sensorium  Memory: -- (Unable to assess- uncooperative with exam)  Judgment: Impaired  Insight: None   Executive Functions  Concentration: Poor  Attention Span: Poor  Recall: Poor  Fund of Knowledge: Poor  Language: Poor   Psychomotor Activity  Psychomotor Activity: Restlessness   Assets  Assets: Physical Health; Transportation; Desire for Improvement   Sleep  Sleep: -- (Unable to assess- uncooperative with exam)  Physical Exam: Physical Exam Vitals reviewed.  HENT:     Head: Normocephalic.  Eyes:     Pupils: Pupils are equal, round, and reactive to light.  Cardiovascular:     Rate and Rhythm: Tachycardia  present.  Pulmonary:     Effort: Pulmonary effort is normal.     Breath sounds: Normal breath sounds.  Musculoskeletal:     Cervical back: Normal range of motion.  Neurological:     General: No focal deficit present.    Review of Systems  Psychiatric/Behavioral:  Positive for hallucinations and substance abuse.     Blood pressure 131/60, pulse (!) 130, temperature 99.2 F (37.3 C), resp. rate 18, SpO2 98 %. There is no height or weight on file to calculate BMI.     [facility] M[facility] Discharge Disposition for Follow up and Recommendations: Based on my evaluation the patient appears to have an emergency medical condition for which I recommend the patient be transferred to the emergency department for further evaluation.  Patient is able to return to [facility] once medically cleared.   Joaquin Courts, NP 01/26/2023, 10:50 AM

## 2023-01-26 NOTE — Progress Notes (Signed)
   01/26/23 1017  [facility] Triage Screening (Walk-ins at [facility] only)  How Did You Hear About [place]? Legal System  What Is the Reason for Your Visit/Call Today? Pt presents to [facility] voluntarily accompanied by his mother and escorted by [employer] due to a paranoia and aggressive behavior. Per [employer] they received a call from the pts mother stating that he was tearing up the bathroom in the home and he broke the toilet. Pts mother reports she noticed a change in the pts behavior after his recent seizure this week, pts mother reports increased agitation. Pts mother states the pt has never acted out in this way until today. Per pts mother the pt told her that he felt like someone was trying to set him up and there were fake pages on his social media account, she states this is what triggered the outburst this morning. Pts mother states the pt is diagnosed with PTSD,ADHD,anxiety and seizure disorder. Pts mother states the pt was hospitalized 2 months ago due to increased seizures. His last visit with his psychiatrist was 2 months ago. Pt is bleeding from both hands, RN and NP notified. Pt calm at the moment. Pt did not elaborate on what happened but states he is "fine".Pt denies SI/HI and AVH.  How Long Has This Been Causing You Problems? <Week  Have You Recently Had Any Thoughts About Hurting Yourself? No  Are You Planning to Commit Suicide/Harm Yourself At This time? No  Have you Recently Had Thoughts About Hurting Someone Karolee Ohs? No  Are You Planning To Harm Someone At This Time? No  Are you currently experiencing any auditory, visual or other hallucinations? No  Have You Used Any Alcohol or Drugs in the Past 24 Hours? Yes  How long ago did you use Drugs or Alcohol? yesterday  What Did You Use and How Much? marijuana, inhaling aerosol, vaping; unknown amount  Do you have any current medical co-morbidities that require immediate attention? No  Clinician description of patient physical appearance/behavior: bleeding from both  hands, quiet  What Do You Feel Would Help You the Most Today? Treatment for Depression or other mood problem  If access to [facility] Urgent Care was not available, would you have sought care in the Emergency Department? No  Determination of Need Urgent (48 hours)  Options For Referral Outpatient Therapy;Medication Management;BH Urgent Care;ED Visit

## 2023-01-26 NOTE — ED Triage Notes (Signed)
Patient arrived by [facility] from [facility] after breaking toilet and cutting hands. Sent to ED for medical clearance and per [facility] can go back to facility once cleared. Patient entered triage yelling, cursing and extremely agitated. Mother at bedside and now patient calm and following commands.

## 2023-01-26 NOTE — ED Provider Notes (Addendum)
[facility] Continuous Assessment Admission H&P  Date: 01/26/23 Patient Name: UGONNA LAMOS MRN: [id number] Chief Complaint:   Diagnoses:  Final diagnoses:  None    HPI:  Patient is returning from [facility], ED for medical clearance.  See encounter note below.  Initial Encounter-Pt sent to ED for medical clearance Filbert E Zajac 27 y.o., male patient presented to [facility] as a walk in accompanied by law enforcement and his mother due to erratic behavior this morning, talking to himself, patient broke a porcelain toilet at home with his bare hands and is actively bleeding, inhaling household cleaning products, and smoking marijuana. Patient's mother, Lowanda Foster [phone], provided history.   Shivan Clydene Pugh, 27 y.o., male patient seen face to face by this provider, consulted with Dr. Viviano Simas; and chart reviewed on 01/26/23.     On evaluation Donavon E Curless is sitting in the interview room responding to internal stimuli as he is laughing and talking to himself.  On evaluation patient has superficial lacerations bilaterally on both hands with a slightly deeper laceration on the dorsum of left hand.  Nursing staff cleaned wounds and bleeding is currently controlled. Patient was asked to make a fist however was unable to.  Patient is obviously under the influence of a substance and is having difficulty following simple commands.  Unable to tell this writer why the police brought him here however is able to state that" I got to get out of here" patient begins to stand up and is following commands to have a seat in the interview room while this writer attempted to speak with him.  So security is on standby.  Patient attempted to elope by pushing on the door in the hallway however security was able to get him back into the interview room.  Patient can be heard yelling that he wants to leave and banging on the door.  This Clinical research associate contacted EDP on-call at [facility] and advised of  patient's injuries and that patient warrants medical clearance in order to be admitted here at [facility] given the mechanism of injury.  Spoke with Dr. Rosalia Hammers at [facility], ED she agreed to accept the patient and advised patient can return here to [facility] once medically cleared.   Return from [facility] evaluation Christ[facility]pher is able to tell this writer that he took his seizure medications this morning.  He is unable to tell this Clinical research associate while he broke the toilet and continues to appear as if he is responding to internal stimuli.  Patient is pacing on the unit and continues to exhibit a labile mood.  Patient has requested to be discharged from [facility].   IVC petitioned by this Clinical research associate and reads as follows: [name] responded has a psychiatric history significant for schizoaffective disorder, multiple prior suicide attempts, aggressive behavior, and polysubstance use disorder.  [name] brought in by law enforcement, on arrival to crisis center respondent was actively bleeding from both hands after breaking the porcelain toilet in his bathroom with his bare hands after becoming aggressive.  [name] sustained multiple lacerations to both hands due to injuries from breaking the toilet. [name] appears under the influence of a substance and is unable to follow basic commands without becoming severely agitated evidenced by pacing, charging at the door, banging on the walls and door, while speaking and laughing to himself.  [name] at present is currently a danger to himself and others in his current mental state and meets criteria for inpatient psychiatric  treatment for acute crisis stabilization, medication management and safety.   Patient is unable to lively contract for safety given his current mental state.  Spoke with patient's mother who was in the lobby upon his return and she was able to provide a list of patient's current medications.  Two medications were not on formulary( APTIOM and Zonisamide) here at  [facility] and patient's home medications were obtained secured by nursing staff.  Total Time spent with patient: 45 minutes  Psychiatric Specialty Exam  Presentation General Appearance:  Bizarre  Eye Contact: Fleeting  Speech: Slow  Speech Volume: Decreased  Handedness: -- (Unable to assess)   Mood and Affect  Mood: Labile  Affect: Congruent   Thought Process  Thought Processes: Disorganized  Descriptions of Associations:Tangential  Orientation:Other (comment) (unable to assess)  Thought Content:Other (comment) (Unable to assess)    Hallucinations:Hallucinations: Other (comment) (Appears to be responding to internal stimuli)  Ideas of Reference:Paranoia  Suicidal Thoughts:Suicidal Thoughts: -- (Unable to assess- uncooperative with exam)  Homicidal Thoughts:Homicidal Thoughts: -- (Unable to assess- uncooperative with exam)   Sensorium  Memory: -- (Unable to assess- uncooperative with exam)  Judgment: Impaired  Insight: None   Executive Functions  Concentration: Poor  Attention Span: Poor  Recall: Poor  Fund of Knowledge: Poor  Language: Poor   Psychomotor Activity  Psychomotor Activity: Psychomotor Activity: Restlessness   Assets  Assets: Physical Health; Transportation; Desire for Improvement   Sleep  Sleep: Sleep: -- (Unable to assess- uncooperative with exam)   Physical Exam HENT:     Head: Normocephalic.  Eyes:     Extraocular Movements: Extraocular movements intact.     Pupils: Pupils are equal, round, and reactive to light.  Cardiovascular:     Rate and Rhythm: Normal rate.  Pulmonary:     Effort: Pulmonary effort is normal.     Breath sounds: Normal breath sounds.  Musculoskeletal:     Cervical back: Normal range of motion.  Neurological:     General: No focal deficit present.     Mental Status: He is alert.       Review of Systems  Psychiatric/Behavioral:  Positive for substance abuse.     Past  Psychiatric History: Schizoaffective Disorder, GAD, Suicidal Ideation   Is the patient at risk to self? Yes  Has the patient been a risk to self in the past 6 months? Yes .    Has the patient been a risk to self within the distant past? Yes   Is the patient a risk to others? Yes   Has the patient been a risk to others in the past 6 months? Yes   Has the patient been a risk to others within the distant past? Yes   Past Medical History: Seizure Disorder   Social History:Lives with mother, [name]  who is actively apart of his medical and mental healthcare.  Last Labs:  Admission on 01/26/2023, Discharged on 01/26/2023  Component Date Value Ref Range Status   Sodium 01/26/2023 136  135 - 145 mmol/L Final   Potassium 01/26/2023 3.7  3.5 - 5.1 mmol/L Final   Chloride 01/26/2023 104  98 - 111 mmol/L Final   CO2 01/26/2023 18 (L)  22 - 32 mmol/L Final   Glucose, Bld 01/26/2023 145 (H)  70 - 99 mg/dL Final   Glucose reference range applies only to samples taken after fasting for at least 8 hours.   BUN 01/26/2023 13  6 - 20 mg/dL Final   Creatinine, Ser  01/26/2023 1.44 (H)  0.61 - 1.24 mg/dL Final   Calcium 16/06/9603 9.9  8.9 - 10.3 mg/dL Final   Total Protein 54/05/8118 7.5  6.5 - 8.1 g/dL Final   Albumin 14/78/2956 4.6  3.5 - 5.0 g/dL Final   AST 21/30/8657 25  15 - 41 U/L Final   ALT 01/26/2023 22  0 - 44 U/L Final   Alkaline Phosphatase 01/26/2023 52  38 - 126 U/L Final   Total Bilirubin 01/26/2023 0.4  0.3 - 1.2 mg/dL Final   GFR, Estimated 01/26/2023 >60  >60 mL/min Final   Comment: (NOTE) Calculated using the CKD-EPI Creatinine Equation (2021)    Anion gap 01/26/2023 14  5 - 15 Final   Performed at [facility] Lab, [address] [place], [place] [address]   Alcohol, Ethyl (B) 01/26/2023 <10  <10 mg/dL Final   Comment: (NOTE) Lowest detectable limit for serum alcohol is 10 mg/dL.  For medical purposes only. Performed at [facility], [address],  [place], [place] [address]    Opiates 01/26/2023 NONE DETECTED  NONE DETECTED Final   Cocaine 01/26/2023 NONE DETECTED  NONE DETECTED Final   Benzodiazepines 01/26/2023 POSITIVE (A)  NONE DETECTED Final   Amphetamines 01/26/2023 NONE DETECTED  NONE DETECTED Final   Tetrahydrocannabinol 01/26/2023 POSITIVE (A)  NONE DETECTED Final   Barbiturates 01/26/2023 POSITIVE (A)  NONE DETECTED Final   Comment: (NOTE) DRUG SCREEN FOR MEDICAL PURPOSES ONLY.  IF CONFIRMATION IS NEEDED FOR ANY PURPOSE, NOTIFY LAB WITHIN 5 DAYS.  LOWEST DETECTABLE LIMITS FOR URINE DRUG SCREEN Drug Class                     Cutoff (ng/mL) Amphetamine and metabolites    1000 Barbiturate and metabolites    200 Benzodiazepine                 200 Opiates and metabolites        300 Cocaine and metabolites        300 THC                            50 Performed at [facility] Lab, [address] [place], [place] [address]    WBC 01/26/2023 17.5 (H)  4.0 - 10.5 K/uL Final   RBC 01/26/2023 5.32  4.22 - 5.81 MIL/uL Final   Hemoglobin 01/26/2023 14.8  13.0 - 17.0 g/dL Final   HCT 40/06/2724 44.5  39.0 - 52.0 % Final   MCV 01/26/2023 83.6  80.0 - 100.0 fL Final   MCH 01/26/2023 27.8  26.0 - 34.0 pg Final   MCHC 01/26/2023 33.3  30.0 - 36.0 g/dL Final   RDW 36/64/4034 12.7  11.5 - 15.5 % Final   Platelets 01/26/2023 228  150 - 400 K/uL Final   nRBC 01/26/2023 0.0  0.0 - 0.2 % Final   Neutrophils Relative % 01/26/2023 92  % Final   Neutro Abs 01/26/2023 16.1 (H)  1.7 - 7.7 K/uL Final   Lymphocytes Relative 01/26/2023 4  % Final   Lymphs Abs 01/26/2023 0.7  0.7 - 4.0 K/uL Final   Monocytes Relative 01/26/2023 4  % Final   Monocytes Absolute 01/26/2023 0.7  0.1 - 1.0 K/uL Final   Eosinophils Relative 01/26/2023 0  % Final   Eosinophils Absolute 01/26/2023 0.0  0.0 - 0.5 K/uL Final   Basophils Relative 01/26/2023 0  % Final  Basophils Absolute 01/26/2023 0.0  0.0 - 0.1 K/uL Final   nRBC 01/26/2023 0  0 /100 WBC Final    Abs Immature Granulocytes 01/26/2023 0.00  0.00 - 0.07 K/uL Final   Performed at [facility] Lab, [address] [place], [place] [address]  No results displayed because visit has over 200 results.    Admission on 11/19/2022, Discharged on 11/19/2022  Component Date Value Ref Range Status   Sodium 11/19/2022 135  135 - 145 mmol/L Final   Potassium 11/19/2022 4.4  3.5 - 5.1 mmol/L Final   Chloride 11/19/2022 101  98 - 111 mmol/L Final   CO2 11/19/2022 23  22 - 32 mmol/L Final   Glucose, Bld 11/19/2022 98  70 - 99 mg/dL Final   Glucose reference range applies only to samples taken after fasting for at least 8 hours.   BUN 11/19/2022 10  6 - 20 mg/dL Final   Creatinine, Ser 11/19/2022 1.08  0.61 - 1.24 mg/dL Final   Calcium 60/45/4098 9.8  8.9 - 10.3 mg/dL Final   Total Protein 11/91/4782 7.5  6.5 - 8.1 g/dL Final   Albumin 95/62/1308 4.6  3.5 - 5.0 g/dL Final   AST 65/78/4696 25  15 - 41 U/L Final   ALT 11/19/2022 15  0 - 44 U/L Final   Alkaline Phosphatase 11/19/2022 41  38 - 126 U/L Final   Total Bilirubin 11/19/2022 0.6  0.3 - 1.2 mg/dL Final   GFR, Estimated 11/19/2022 >60  >60 mL/min Final   Comment: (NOTE) Calculated using the CKD-EPI Creatinine Equation (2021)    Anion gap 11/19/2022 11  5 - 15 Final   Performed at [facility] Lab, [address] [place], [place] [address]   WBC 11/19/2022 5.3  4.0 - 10.5 K/uL Final   RBC 11/19/2022 5.71  4.22 - 5.81 MIL/uL Final   Hemoglobin 11/19/2022 16.1  13.0 - 17.0 g/dL Final   HCT 41/32/4401 47.6  39.0 - 52.0 % Final   MCV 11/19/2022 83.4  80.0 - 100.0 fL Final   MCH 11/19/2022 28.2  26.0 - 34.0 pg Final   MCHC 11/19/2022 33.8  30.0 - 36.0 g/dL Final   RDW 02/72/5366 13.3  11.5 - 15.5 % Final   Platelets 11/19/2022 257  150 - 400 K/uL Final   nRBC 11/19/2022 0.0  0.0 - 0.2 % Final   Neutrophils Relative % 11/19/2022 67  % Final   Neutro Abs 11/19/2022 3.5  1.7 - 7.7 K/uL Final   Lymphocytes Relative 11/19/2022 25  % Final    Lymphs Abs 11/19/2022 1.3  0.7 - 4.0 K/uL Final   Monocytes Relative 11/19/2022 8  % Final   Monocytes Absolute 11/19/2022 0.4  0.1 - 1.0 K/uL Final   Eosinophils Relative 11/19/2022 0  % Final   Eosinophils Absolute 11/19/2022 0.0  0.0 - 0.5 K/uL Final   Basophils Relative 11/19/2022 0  % Final   Basophils Absolute 11/19/2022 0.0  0.0 - 0.1 K/uL Final   Immature Granulocytes 11/19/2022 0  % Final   Abs Immature Granulocytes 11/19/2022 0.02  0.00 - 0.07 K/uL Final   Performed at [facility], [address], [place], [place] [address]   Valproic Acid Lvl 11/19/2022 30 (L)  50.0 - 100.0 ug/mL Final   Performed at [facility], [address], [place], [place] [address]   Glucose-Capillary 11/19/2022 109 (H)  70 - 99 mg/dL Final   Glucose reference range applies  only to samples taken after fasting for at least 8 hours.  Admission on 09/28/2022, Discharged on 09/28/2022  Component Date Value Ref Range Status   Sodium 09/28/2022 135  135 - 145 mmol/L Final   Potassium 09/28/2022 4.0  3.5 - 5.1 mmol/L Final   Chloride 09/28/2022 99  98 - 111 mmol/L Final   CO2 09/28/2022 23  22 - 32 mmol/L Final   Glucose, Bld 09/28/2022 138 (H)  70 - 99 mg/dL Final   Glucose reference range applies only to samples taken after fasting for at least 8 hours.   BUN 09/28/2022 7  6 - 20 mg/dL Final   Creatinine, Ser 09/28/2022 1.25 (H)  0.61 - 1.24 mg/dL Final   Calcium 16/06/9603 9.2  8.9 - 10.3 mg/dL Final   Total Protein 54/05/8118 7.6  6.5 - 8.1 g/dL Final   Albumin 14/78/2956 4.7  3.5 - 5.0 g/dL Final   AST 21/30/8657 27  15 - 41 U/L Final   ALT 09/28/2022 16  0 - 44 U/L Final   Alkaline Phosphatase 09/28/2022 42  38 - 126 U/L Final   Total Bilirubin 09/28/2022 0.6  0.3 - 1.2 mg/dL Final   GFR, Estimated 09/28/2022 >60  >60 mL/min Final   Comment: (NOTE) Calculated using the CKD-EPI Creatinine Equation (2021)    Anion gap 09/28/2022 13  5 - 15 Final   Performed at [facility],  [address], [place], [place] [address]   WBC 09/28/2022 16.5 (H)  4.0 - 10.5 K/uL Final   RBC 09/28/2022 5.70  4.22 - 5.81 MIL/uL Final   Hemoglobin 09/28/2022 16.1  13.0 - 17.0 g/dL Final   HCT 29/52/8413 47.8  39.0 - 52.0 % Final   MCV 09/28/2022 83.9  80.0 - 100.0 fL Final   MCH 09/28/2022 28.2  26.0 - 34.0 pg Final   MCHC 09/28/2022 33.7  30.0 - 36.0 g/dL Final   RDW 24/40/1027 13.2  11.5 - 15.5 % Final   Platelets 09/28/2022 284  150 - 400 K/uL Final   nRBC 09/28/2022 0.0  0.0 - 0.2 % Final   Neutrophils Relative % 09/28/2022 90  % Final   Neutro Abs 09/28/2022 14.8 (H)  1.7 - 7.7 K/uL Final   Lymphocytes Relative 09/28/2022 6  % Final   Lymphs Abs 09/28/2022 0.9  0.7 - 4.0 K/uL Final   Monocytes Relative 09/28/2022 4  % Final   Monocytes Absolute 09/28/2022 0.7  0.1 - 1.0 K/uL Final   Eosinophils Relative 09/28/2022 0  % Final   Eosinophils Absolute 09/28/2022 0.0  0.0 - 0.5 K/uL Final   Basophils Relative 09/28/2022 0  % Final   Basophils Absolute 09/28/2022 0.0  0.0 - 0.1 K/uL Final   Immature Granulocytes 09/28/2022 0  % Final   Abs Immature Granulocytes 09/28/2022 0.05  0.00 - 0.07 K/uL Final   Performed at [facility], [address], [place], [place] [address]   Valproic Acid Lvl 09/28/2022 26 (L)  50.0 - 100.0 ug/mL Final   Performed at [facility], [address] [place], [place] [address]   Alcohol, Ethyl (B) 09/28/2022 <10  <10 mg/dL Final   Comment: (NOTE) Lowest detectable limit for serum alcohol is 10 mg/dL.  For medical purposes only. Performed at [facility], [address] [place], [place] [address]    Opiates 09/28/2022 NONE DETECTED  NONE DETECTED Final   Cocaine 09/28/2022 NONE DETECTED  NONE DETECTED Final  Benzodiazepines 09/28/2022 POSITIVE (A)  NONE DETECTED Final   Amphetamines 09/28/2022 NONE DETECTED  NONE DETECTED Final   Tetrahydrocannabinol 09/28/2022 POSITIVE (A)  NONE DETECTED Final   Barbiturates 09/28/2022 NONE  DETECTED  NONE DETECTED Final   Comment: (NOTE) DRUG SCREEN FOR MEDICAL PURPOSES ONLY.  IF CONFIRMATION IS NEEDED FOR ANY PURPOSE, NOTIFY LAB WITHIN 5 DAYS.  LOWEST DETECTABLE LIMITS FOR URINE DRUG SCREEN Drug Class                     Cutoff (ng/mL) Amphetamine and metabolites    1000 Barbiturate and metabolites    200 Benzodiazepine                 200 Opiates and metabolites        300 Cocaine and metabolites        300 THC                            50 Performed at [facility] Lab, [address] [place], [place] [address]   Admission on 08/16/2022, Discharged on 08/16/2022  Component Date Value Ref Range Status   Sodium 08/16/2022 136  135 - 145 mmol/L Final   Potassium 08/16/2022 5.5 (H)  3.5 - 5.1 mmol/L Final   Chloride 08/16/2022 96 (L)  98 - 111 mmol/L Final   CO2 08/16/2022 13 (L)  22 - 32 mmol/L Final   Glucose, Bld 08/16/2022 253 (H)  70 - 99 mg/dL Final   Glucose reference range applies only to samples taken after fasting for at least 8 hours.   BUN 08/16/2022 9  6 - 20 mg/dL Final   Creatinine, Ser 08/16/2022 1.46 (H)  0.61 - 1.24 mg/dL Final   Calcium 72/53/6644 9.8  8.9 - 10.3 mg/dL Final   GFR, Estimated 08/16/2022 >60  >60 mL/min Final   Comment: (NOTE) Calculated using the CKD-EPI Creatinine Equation (2021)    Anion gap 08/16/2022 27 (H)  5 - 15 Final   Comment: Electrolytes repeated to confirm. Performed at [facility], [address], [place], [place] [address]    WBC 08/16/2022 10.4  4.0 - 10.5 K/uL Final   RBC 08/16/2022 6.06 (H)  4.22 - 5.81 MIL/uL Final   Hemoglobin 08/16/2022 17.0  13.0 - 17.0 g/dL Final   HCT 25/95/6387 53.2 (H)  39.0 - 52.0 % Final   MCV 08/16/2022 87.8  80.0 - 100.0 fL Final   MCH 08/16/2022 28.1  26.0 - 34.0 pg Final   MCHC 08/16/2022 32.0  30.0 - 36.0 g/dL Final   RDW 56/43/3295 13.2  11.5 - 15.5 % Final   Platelets 08/16/2022 286  150 - 400 K/uL Final   nRBC 08/16/2022 0.0  0.0 - 0.2 % Final   Neutrophils  Relative % 08/16/2022 53  % Final   Neutro Abs 08/16/2022 5.6  1.7 - 7.7 K/uL Final   Lymphocytes Relative 08/16/2022 36  % Final   Lymphs Abs 08/16/2022 3.7  0.7 - 4.0 K/uL Final   Monocytes Relative 08/16/2022 8  % Final   Monocytes Absolute 08/16/2022 0.8  0.1 - 1.0 K/uL Final   Eosinophils Relative 08/16/2022 0  % Final   Eosinophils Absolute 08/16/2022 0.0  0.0 - 0.5 K/uL Final   Basophils Relative 08/16/2022 1  % Final   Basophils Absolute 08/16/2022 0.1  0.0 - 0.1 K/uL Final   Immature Granulocytes 08/16/2022 2  % Final  Abs Immature Granulocytes 08/16/2022 0.19 (H)  0.00 - 0.07 K/uL Final   Performed at [facility] Lab, [address], [place], [place] [address]   Valproic Acid Lvl 08/16/2022 33 (L)  50.0 - 100.0 ug/mL Final   Performed at [facility], [address], [place], [place] [address]    Allergies: Keppra [levetiracetam], Tramadol, and Vimpat [lacosamide]  Medications:  Facility Ordered Medications  Medication   [COMPLETED] LORazepam (ATIVAN) injection 1 mg   clonazePAM (KLONOPIN) disintegrating tablet 0.5 mg   risperiDONE (RISPERDAL) tablet 1 mg   zonisamide (ZONEGRAN) capsule 300 mg   acetaminophen (TYLENOL) tablet 650 mg   alum & mag hydroxide-simeth (MAALOX/MYLANTA) 200-200-20 MG/5ML suspension 30 mL   magnesium hydroxide (MILK OF MAGNESIA) suspension 30 mL   LORazepam (ATIVAN) tablet 2 mg   Or   LORazepam (ATIVAN) tablet 2 mg   [START ON 01/27/2023] Eslicarbazepine Acetate TABS 1,600 mg   PTA Medications  Medication Sig   carvedilol (COREG) 6.25 MG tablet Take 1 tablet (6.25 mg total) by mouth 2 (two) times daily with a meal. (Patient not taking: Reported on 11/26/2022)   APTIOM 800 MG TABS Take 1,600 mg by mouth daily.   clonazePAM (KLONOPIN) 0.5 MG disintegrating tablet Take 0.5 mg by mouth daily as needed for anxiety. (Patient not taking: Reported on 11/26/2022)   risperiDONE (RISPERDAL) 1 MG tablet Take 1 tablet (1 mg total) by mouth 2 (two)  times daily.   topiramate (TOPAMAX) 50 MG tablet Take 1 tablet (50 mg total) by mouth 2 (two) times daily.      Medical Decision Making  Patient case review and discussed with Dr. Viviano Simas and patient meets criteria for inpatient psychiatric treatment.  Patient is unable to reliably contract for safety at this time. There is currently no appropriate bed availability at [facility] and patient will be faxed out to other appropriate facilities for review of inpatient admission.    Recommendations  Recommend psychiatric inpatient treatment. Continue home medications. Adjustment made to Risperidone increased form 1 mg BID to 2 mg BID due to active psychosis and agitation. Elevated Blood Pressure, restart Carvedilol 6.25 mg BID  Meds ordered this encounter  Medications   clonazePAM (KLONOPIN) disintegrating tablet 0.5 mg   alum & mag hydroxide-simeth (MAALOX/MYLANTA) 200-200-20 MG/5ML suspension 30 mL   magnesium hydroxide (MILK OF MAGNESIA) suspension 30 mL   OR Linked Order Group    LORazepam (ATIVAN) tablet 2 mg    LORazepam (ATIVAN) tablet 2 mg   Eslicarbazepine Acetate TABS 1,600 mg   zonisamide (ZONEGRAN) capsule 300 mg   OLANZapine zydis (ZYPREXA) disintegrating tablet 15 mg   risperiDONE (RISPERDAL) tablet 2 mg    Dose adjustment     Joaquin Courts, NP 01/26/23  2:14 PM

## 2023-01-26 NOTE — ED Notes (Signed)
Lacerations and cuts noted to bilateral hands, bleeding stopped on its own, cleaned areas, pt tolerated well.

## 2023-01-27 ENCOUNTER — Telehealth (HOSPITAL_COMMUNITY): Payer: Self-pay

## 2023-01-27 ENCOUNTER — Other Ambulatory Visit: Payer: Self-pay

## 2023-01-27 ENCOUNTER — Encounter (HOSPITAL_COMMUNITY): Payer: Self-pay | Admitting: Emergency Medicine

## 2023-01-27 ENCOUNTER — Encounter (HOSPITAL_COMMUNITY): Payer: Self-pay | Admitting: Psychiatry

## 2023-01-27 ENCOUNTER — Inpatient Hospital Stay (HOSPITAL_COMMUNITY)
Admission: AD | Admit: 2023-01-27 | Discharge: 2023-02-04 | DRG: 885 | Disposition: A | Discharge status: Home / Self Care | Payer: Medicaid Other | Source: Other Acute Inpatient Hospital | Attending: Psychiatry | Admitting: Psychiatry

## 2023-01-27 DIAGNOSIS — J309 Allergic rhinitis, unspecified: Secondary | ICD-10-CM | POA: Diagnosis present

## 2023-01-27 DIAGNOSIS — Z634 Disappearance and death of family member: Secondary | ICD-10-CM

## 2023-01-27 DIAGNOSIS — F250 Schizoaffective disorder, bipolar type: Secondary | ICD-10-CM | POA: Diagnosis present

## 2023-01-27 DIAGNOSIS — F312 Bipolar disorder, current episode manic severe with psychotic features: Principal | ICD-10-CM | POA: Diagnosis present

## 2023-01-27 DIAGNOSIS — F17290 Nicotine dependence, other tobacco product, uncomplicated: Secondary | ICD-10-CM | POA: Diagnosis present

## 2023-01-27 DIAGNOSIS — G4700 Insomnia, unspecified: Secondary | ICD-10-CM | POA: Diagnosis present

## 2023-01-27 DIAGNOSIS — I10 Essential (primary) hypertension: Secondary | ICD-10-CM | POA: Diagnosis present

## 2023-01-27 DIAGNOSIS — G40901 Epilepsy, unspecified, not intractable, with status epilepticus: Secondary | ICD-10-CM | POA: Diagnosis present

## 2023-01-27 DIAGNOSIS — F4310 Post-traumatic stress disorder, unspecified: Secondary | ICD-10-CM | POA: Diagnosis present

## 2023-01-27 DIAGNOSIS — F1220 Cannabis dependence, uncomplicated: Secondary | ICD-10-CM | POA: Diagnosis present

## 2023-01-27 DIAGNOSIS — F909 Attention-deficit hyperactivity disorder, unspecified type: Secondary | ICD-10-CM | POA: Diagnosis present

## 2023-01-27 DIAGNOSIS — Z79899 Other long term (current) drug therapy: Secondary | ICD-10-CM | POA: Diagnosis not present

## 2023-01-27 DIAGNOSIS — K5900 Constipation, unspecified: Secondary | ICD-10-CM | POA: Diagnosis present

## 2023-01-27 DIAGNOSIS — R569 Unspecified convulsions: Secondary | ICD-10-CM

## 2023-01-27 DIAGNOSIS — F411 Generalized anxiety disorder: Secondary | ICD-10-CM | POA: Diagnosis present

## 2023-01-27 MED ORDER — LORAZEPAM 1 MG PO TABS
2.0000 mg | ORAL_TABLET | Freq: Three times a day (TID) | ORAL | Status: DC | PRN
  Administered 2023-01-28 – 2023-01-29 (×2): 2 mg via ORAL
  Filled 2023-01-27 (×4): qty 2

## 2023-01-27 MED ORDER — DIVALPROEX SODIUM 250 MG PO DR TAB
250.0000 mg | DELAYED_RELEASE_TABLET | Freq: Two times a day (BID) | ORAL | Status: DC
  Administered 2023-01-27 – 2023-01-29 (×4): 250 mg via ORAL
  Filled 2023-01-27 (×9): qty 1

## 2023-01-27 MED ORDER — DIVALPROEX SODIUM 500 MG PO DR TAB
500.0000 mg | DELAYED_RELEASE_TABLET | Freq: Two times a day (BID) | ORAL | Status: DC
  Administered 2023-01-27: 500 mg via ORAL
  Filled 2023-01-27: qty 1

## 2023-01-27 MED ORDER — CLONAZEPAM 0.25 MG PO TBDP
0.5000 mg | ORAL_TABLET | Freq: Three times a day (TID) | ORAL | Status: DC | PRN
  Administered 2023-01-27 – 2023-01-28 (×2): 0.5 mg via ORAL
  Filled 2023-01-27 (×2): qty 2

## 2023-01-27 MED ORDER — TRAZODONE HCL 50 MG PO TABS
50.0000 mg | ORAL_TABLET | Freq: Every evening | ORAL | Status: DC | PRN
  Administered 2023-01-27 – 2023-02-03 (×8): 50 mg via ORAL
  Filled 2023-01-27 (×8): qty 1

## 2023-01-27 MED ORDER — DIPHENHYDRAMINE HCL 25 MG PO CAPS: 25.0000 mg | ORAL_CAPSULE | Freq: Once | ORAL | Status: DC

## 2023-01-27 MED ORDER — ZONISAMIDE 100 MG PO CAPS: 300.0000 mg | ORAL_CAPSULE | Freq: Every day | ORAL | Status: DC

## 2023-01-27 MED ORDER — CLONAZEPAM 1 MG PO TABS
1.0000 mg | ORAL_TABLET | Freq: Once | ORAL | Status: AC
  Filled 2023-01-27: qty 1

## 2023-01-27 MED ORDER — HALOPERIDOL LACTATE 5 MG/ML IJ SOLN: 5.0000 mg | Freq: Three times a day (TID) | INTRAMUSCULAR | Status: DC | PRN

## 2023-01-27 MED ORDER — ACETAMINOPHEN 325 MG PO TABS: 650.0000 mg | ORAL_TABLET | Freq: Four times a day (QID) | ORAL | Status: DC | PRN

## 2023-01-27 MED ORDER — APTIOM 800 MG PO TABS: 1600.0000 mg | ORAL_TABLET | Freq: Every day | ORAL | Status: DC

## 2023-01-27 MED ORDER — ESLICARBAZEPINE ACETATE 800 MG PO TABS
1600.0000 mg | ORAL_TABLET | Freq: Every day | ORAL | Status: DC
  Administered 2023-01-28 – 2023-02-04 (×8): 1600 mg via ORAL

## 2023-01-27 MED ORDER — CARVEDILOL 6.25 MG PO TABS: 6.2500 mg | ORAL_TABLET | Freq: Two times a day (BID) | ORAL | Status: DC

## 2023-01-27 MED ORDER — ZONISAMIDE 100 MG PO CAPS
300.0000 mg | ORAL_CAPSULE | Freq: Every day | ORAL | Status: DC
  Administered 2023-01-28 – 2023-02-04 (×8): 300 mg via ORAL
  Filled 2023-01-27 (×11): qty 3

## 2023-01-27 MED ORDER — RISPERIDONE 2 MG PO TABS: 2.0000 mg | ORAL_TABLET | Freq: Two times a day (BID) | ORAL | Status: DC

## 2023-01-27 MED ORDER — TRAZODONE HCL 50 MG PO TABS: 50.0000 mg | ORAL_TABLET | Freq: Every evening | ORAL | Status: DC | PRN

## 2023-01-27 MED ORDER — DIPHENHYDRAMINE HCL 50 MG/ML IJ SOLN: 50.0000 mg | Freq: Three times a day (TID) | INTRAMUSCULAR | Status: DC | PRN

## 2023-01-27 MED ORDER — CLONAZEPAM 0.5 MG PO TBDP: 0.5000 mg | ORAL_TABLET | Freq: Three times a day (TID) | ORAL | 0 refills | Status: DC | PRN

## 2023-01-27 MED ORDER — ALUM & MAG HYDROXIDE-SIMETH 200-200-20 MG/5ML PO SUSP: 30.0000 mL | ORAL | Status: DC | PRN

## 2023-01-27 MED ORDER — RISPERIDONE 2 MG PO TABS
2.0000 mg | ORAL_TABLET | Freq: Two times a day (BID) | ORAL | Status: DC
  Administered 2023-01-27 – 2023-01-28 (×2): 2 mg via ORAL
  Filled 2023-01-27 (×7): qty 1

## 2023-01-27 MED ORDER — CARVEDILOL 6.25 MG PO TABS
6.2500 mg | ORAL_TABLET | Freq: Two times a day (BID) | ORAL | Status: DC
  Administered 2023-01-28 – 2023-02-01 (×9): 6.25 mg via ORAL
  Filled 2023-01-27 (×13): qty 1

## 2023-01-27 MED ORDER — ZIPRASIDONE MESYLATE 20 MG IM SOLR
20.0000 mg | INTRAMUSCULAR | Status: DC | PRN
  Filled 2023-01-27: qty 20

## 2023-01-27 MED ORDER — MAGNESIUM HYDROXIDE 400 MG/5ML PO SUSP: 30.0000 mL | Freq: Every day | ORAL | Status: DC | PRN

## 2023-01-27 MED ORDER — DIPHENHYDRAMINE HCL 25 MG PO CAPS
50.0000 mg | ORAL_CAPSULE | Freq: Three times a day (TID) | ORAL | Status: DC | PRN
  Administered 2023-01-28 – 2023-01-29 (×2): 50 mg via ORAL
  Filled 2023-01-27 (×4): qty 2

## 2023-01-27 MED ORDER — ZIPRASIDONE MESYLATE 20 MG IM SOLR: 20.0000 mg | Freq: Two times a day (BID) | INTRAMUSCULAR | Status: DC | PRN

## 2023-01-27 MED ORDER — LORAZEPAM 2 MG/ML IJ SOLN: 2.0000 mg | Freq: Three times a day (TID) | INTRAMUSCULAR | Status: DC | PRN

## 2023-01-27 MED ORDER — LORATADINE 10 MG PO TABS
10.0000 mg | ORAL_TABLET | Freq: Every day | ORAL | Status: DC
  Administered 2023-01-28 – 2023-02-04 (×8): 10 mg via ORAL
  Filled 2023-01-27 (×12): qty 1

## 2023-01-27 MED ORDER — DIVALPROEX SODIUM 250 MG PO DR TAB: 250.0000 mg | DELAYED_RELEASE_TABLET | Freq: Two times a day (BID) | ORAL | Status: DC

## 2023-01-27 MED ORDER — RISPERIDONE 2 MG PO TBDP
2.0000 mg | ORAL_TABLET | Freq: Three times a day (TID) | ORAL | Status: DC | PRN
  Administered 2023-01-27: 2 mg via ORAL
  Filled 2023-01-27: qty 1

## 2023-01-27 MED ORDER — HALOPERIDOL 5 MG PO TABS
5.0000 mg | ORAL_TABLET | Freq: Three times a day (TID) | ORAL | Status: DC | PRN
  Administered 2023-01-28 – 2023-01-29 (×2): 5 mg via ORAL
  Filled 2023-01-27 (×4): qty 1

## 2023-01-27 MED ORDER — LORAZEPAM 1 MG PO TABS
1.0000 mg | ORAL_TABLET | ORAL | Status: AC | PRN
  Administered 2023-01-27: 1 mg via ORAL
  Filled 2023-01-27 (×2): qty 1

## 2023-01-27 MED ORDER — LORAZEPAM 1 MG PO TABS: 1.0000 mg | ORAL_TABLET | ORAL | Status: DC | PRN

## 2023-01-27 NOTE — ED Notes (Signed)
Report called to Scientist, research (medical) at [facility].  Verbalized understanding.  [name] mother also notified.

## 2023-01-27 NOTE — Progress Notes (Signed)
Admission Note:  27 yr male who presents IVC in no acute distress for the treatment of psychosis and aggression. Pt was on the unit when writer shift started, as much of admission completed as pt would allow. Pt continued to state he was leaving tonight , "I'm bust through those doors and be down [place]" . Pt continued to state he did not belong here and he was D/C from the other hospital and the only reason he came here was so his mother could pick him up.   Per assessment: HPI: Patient initially presented to [facility] on 01/26/2023 via law enforcement with his mother due to erratic behavior, talking to himself, and patient broke a porcelain toilet at home with his bare hands.  He presented initially actively bleeding and was sent to [facility] emergency department for medical clearance.  After being medically cleared he was transferred back to [facility] and admitted to the continuous assessment unit while awaiting inpatient psychiatric bed availability.   UDS upon admission is positive for benzodiazepines, marijuana, and barbiturates.  EtOH is negative   Initially ordered Depakote 500 mg twice daily for mood stabilization.  After speaking with patient's mother and learning of patient's past Depakote experience with high ammonia level, Depakote was decreased to 250 twice daily.  Patient had already received one 500 mg dose.  Recommendation would be for patient to receive Invega sustain a injection during his inpatient hospitalization.   Collateral: Lowanda Foster (mother) [phone].  States patient is not at his baseline at this time.  States as of this a.m. patient was calling her telling her that he was going to protect her and the rest of his family.  States he told her that do not be surprised if somebody comes over today trying to shoot up the house.  He has also made comments to her that he is going to find a gun so he can be able to protect them.  Mother states patient has been stabilized in the  past on [place] injection.  She is unsure how long it has been since patient has had injection.  Also states patient was on Depakote in the past but had to be titrated down due to a high ammonia level.  Mother does not believe that patient can be safely discharged at this time and believes that he needs to be psychiatrically hospitalized.  Skin was assessed by previous shift . POC and unit policies explained and understanding verbalized. Consents obtained.   R: Pt had no additional questions or concerns.

## 2023-01-27 NOTE — Progress Notes (Deleted)
LCSW Progress Note  [id number]   Dylan Moon  01/27/2023  11:24 AM  Description:   Inpatient Psychiatric Referral  Patient was recommended inpatient per Vernard Gambles, NP. There are no available beds at [facility], per [facility] Malva Limes, RN. Patient was referred to the following out of network facilities:   [employer] Provider Address Phone Fax  [facility]  [address] [place] [address] [phone]  [facility]  [address] [place] [address] [phone]  [facility] [address]  [address], [place] [address] [phone]  [facility]  [address] [place], [place] [address] [phone]  [facility] [address] [place]  [address] [place] [address] [phone]  [facility]  [address] [place] [address] [phone]  [facility]  [address] [place] [address] [phone]  [facility]  [address] [place], [place] [address] [phone]  [facility]  [address] [place] [address] [phone]  [facility]  [address], [place] [address] [phone]  [facility]  [address] [place] [address] [phone]  [facility]  [address], [place] [address] [phone]  [facility]  [address], [place] [address] [phone]  [facility]  [address], [place] [address] [phone]  [facility]  [address] [place] [address] [phone]  [facility]  [address] [place] [address] [phone]  [facility]  [address], [place] [address] [phone]  [facility]  [address] [place] [address] [phone]  [facility]  [address] [place] [address] [phone]  [facility]  [address] [place], [place] [address] [phone]  [facility] [place] [address] [phone]  [facility]  [address] [place] [address] [phone]  [facility]  [address], [place] [address] [phone]  [facility]  [address], [place] [address] [phone]  [facility]  [address] [place] [address] [phone]    Situation ongoing, CSW to continue following and update chart as more information becomes available.      Cathie Beams, [place]  01/27/2023 11:24 AM

## 2023-01-27 NOTE — ED Notes (Signed)
Pt given toothbrush and hygiene supplies. He was escorted to the bathroom.

## 2023-01-27 NOTE — Discharge Instructions (Signed)
Transfer to [facility] for IP admisison, Dr. Sherron Flemings is the excepting physician.

## 2023-01-27 NOTE — ED Notes (Signed)
Pt offered breakfast but refused

## 2023-01-27 NOTE — ED Notes (Signed)
IVC paperwork with emtala and pt's 2 home meds were given to [employer]. Pt was escorted to [place] and left without incident.  [name] mother beverly was called and informed that pt had left.  [facility] number was given to her.   Pt had no other belongings with him.

## 2023-01-27 NOTE — Progress Notes (Signed)
Pt irritable, not cooperative pt  continues to state he is leaving tonight. Pt stated he was D/C from the other facility so his mother could pick him up from here tonight. Writer tries to explain to pt that once he came here the only person that can D/C him is the doctor. Pt continues to insist he is leaving tonight.

## 2023-01-27 NOTE — ED Notes (Signed)
Pt offered medications but refused at this time. Continues to ask to speak with a provider.

## 2023-01-27 NOTE — ED Notes (Signed)
Pt is currently awake and talking on the phone.  He is asking to speak with a provider.  Pt was informed that the providers are not in at this time.

## 2023-01-27 NOTE — Progress Notes (Signed)
Pt continues to express he is not supposed to be here, and his mother is going to pick him up and take him home, pt stated we did not have his medication so he initially refused the medication , but pt finally took his HS medications. Pt encouraged to talk to the doctor in the morning, pt has no insight into Tx.     01/27/23 2200  Psych Admission Type (Psych Patients Only)  Admission Status Involuntary  Psychosocial Assessment  Patient Complaints Restlessness;Agitation;Irritability  Eye Contact Fair  Facial Expression Anxious  Affect Irritable;Preoccupied  Speech Argumentative;Aggressive  Interaction Demanding  Motor Activity Restless;Pacing  Appearance/Hygiene Disheveled;In scrubs  Behavior Characteristics Agitated;Pacing;Irritable  Mood Preoccupied;Labile;Anxious  Aggressive Behavior  Effect No apparent injury  Thought Process  Coherency Concrete thinking;Circumstantial  Content Preoccupation  Delusions Paranoid  Perception Hallucinations  Hallucination Auditory  Judgment Poor  Confusion None  Danger to Self  Current suicidal ideation? Denies  Danger to Others  Danger to Others None reported or observed  Danger to Others Abnormal  Harmful Behavior to others No threats or harm toward other people

## 2023-01-27 NOTE — ED Notes (Signed)
Pt called 911 and stated he was having a seizure.  Pt is awake and alert.  Cognizant of location and time.  Pt was redirected and phone turned off.

## 2023-01-27 NOTE — ED Notes (Signed)
Pt was escorted to the bathroom.  He returned to flex.

## 2023-01-27 NOTE — Tx Team (Signed)
Initial Treatment Plan 01/27/2023 10:33 PM Dylan Moon UJW:[id number]    PATIENT STRESSORS: Marital or family conflict   Medication change or noncompliance   Substance abuse     PATIENT STRENGTHS: General fund of knowledge  Motivation for treatment/growth  Supportive family/friends    PATIENT IDENTIFIED PROBLEMS: Psychosis  aggression  seizures  "Nothing, I'm not supposed to be here"               DISCHARGE CRITERIA:  Improved stabilization in mood, thinking, and/or behavior Verbal commitment to aftercare and medication compliance  PRELIMINARY DISCHARGE PLAN: Attend aftercare/continuing care group Attend PHP/IOP Attend 12-step recovery group Outpatient therapy  PATIENT/FAMILY INVOLVEMENT: This treatment plan has been presented to and reviewed with the patient, Dylan Moon.  The patient and family have been given the opportunity to ask questions and make suggestions.  Delos Haring, RN 01/27/2023, 10:33 PM

## 2023-01-27 NOTE — ED Provider Notes (Signed)
FBC/OBS ASAP Discharge Summary  Date and Time: 01/27/2023 11:32 AM  Name: Dylan Moon  MRN:  [id number]   Discharge Diagnoses:  Final diagnoses:  Schizoaffective disorder, unspecified type [facility])  Destructive behavior  Aggressive behavior   HPI: Patient initially presented to [facility] on 01/26/2023 via law enforcement with his mother due to erratic behavior, talking to himself, and patient broke a porcelain toilet at home with his bare hands.  He presented initially actively bleeding and was sent to [facility] emergency department for medical clearance.  After being medically cleared he was transferred back to [facility] and admitted to the continuous assessment unit while awaiting inpatient psychiatric bed availability.  Dylan Moon, 27 y.o., male patient seen face to face by this provider, consulted with Dr. Lucianne Muss; and chart reviewed on 01/27/23.  Per chart review patient has a past psychiatric history of schizoaffective disorder, bipolar type, SI, and polysubstance abuse.  UDS upon admission is positive for benzodiazepines, marijuana, and barbiturates.  EtOH is negative   Subjective:   On evaluation patient is observed standing and talking on the phone.  He is loud while talking on the phone.  However upon approach his speech is clear, coherent, at a normal rate and tone.  He is animated while speaking and answering questions.  He is dressed in scrubs.  He is anxious and labile.  He makes fleeting eye contact.  He denies SI/HI/AVH.  He does not appear to be responding to internal/external stimuli.  He does endorse paranoia and believes that people are sending him messages on his phone and on his social media.  He is vague and somewhat reluctant to discuss his paranoia.  He admits to not taking his medications prior to admission.  He has received as needed agitation medications while on the unit and has had to be redirected by staff.  When asking patient why he called 911 from the unit this  a.m. and reporting that he was having a seizure.  He states, "I felt fine, I was not having no seizure, I just wanted to get out of here".  Initially ordered Depakote 500 mg twice daily for mood stabilization.  After speaking with patient's mother and learning of patient's past Depakote experience with high ammonia level, Depakote was decreased to 250 twice daily.  Patient had already received one 500 mg dose.  Recommendation would be for patient to receive Invega sustain a injection during his inpatient hospitalization.  Collateral: Lowanda Foster (mother) [phone].  States patient is not at his baseline at this time.  States as of this a.m. patient was calling her telling her that he was going to protect her and the rest of his family.  States he told her that do not be surprised if somebody comes over today trying to shoot up the house.  He has also made comments to her that he is going to find a gun so he can be able to protect them.  Mother states patient has been stabilized in the past on [place] injection.  She is unsure how long it has been since patient has had injection.  Also states patient was on Depakote in the past but had to be titrated down due to a high ammonia level.  Mother does not believe that patient can be safely discharged at this time and believes that he needs to be psychiatrically hospitalized.   Stay Summary:   Patient continues to meet criteria for inpatient psychiatric admission.  [facility] notified and patient has been accepted.  IVC will remain in place.  Total Time spent with patient: 30 minutes  Past Psychiatric History: As documented in H&P Past Medical History: As documented in H&P Family History: As documented in H&P Family Psychiatric History: As documented in H&P Social History: As documented in H&P Tobacco Cessation:  N/A, patient does not currently use tobacco products  Current Medications:  Current Facility-Administered Medications  Medication  Dose Route Frequency Provider Last Rate Last Admin   acetaminophen (TYLENOL) tablet 650 mg  650 mg Oral Q6H PRN Bing Neighbors, NP       alum & mag hydroxide-simeth (MAALOX/MYLANTA) 200-200-20 MG/5ML suspension 30 mL  30 mL Oral Q4H PRN Bing Neighbors, NP       carvedilol (COREG) tablet 6.25 mg  6.25 mg Oral BID WC Bing Neighbors, NP   6.25 mg at 01/27/23 0801   clonazePAM (KLONOPIN) disintegrating tablet 0.5 mg  0.5 mg Oral TID PRN Bing Neighbors, NP   0.5 mg at 01/27/23 1610   diphenhydrAMINE (BENADRYL) capsule 25 mg  25 mg Oral Once Ajibola, Ene A, NP       divalproex (DEPAKOTE) DR tablet 500 mg  500 mg Oral BID Ardis Hughs, NP       Eslicarbazepine Acetate TABS 1,600 mg  1,600 mg Oral Daily Oneta Rack, NP   1,600 mg at 01/27/23 0802   risperiDONE (RISPERDAL M-TABS) disintegrating tablet 2 mg  2 mg Oral Q8H PRN Ardis Hughs, NP       And   LORazepam (ATIVAN) tablet 1 mg  1 mg Oral PRN Ardis Hughs, NP       And   ziprasidone (GEODON) injection 20 mg  20 mg Intramuscular PRN Ardis Hughs, NP       magnesium hydroxide (MILK OF MAGNESIA) suspension 30 mL  30 mL Oral Daily PRN Bing Neighbors, NP       risperiDONE (RISPERDAL) tablet 2 mg  2 mg Oral BID Bing Neighbors, NP   2 mg at 01/27/23 0801   traZODone (DESYREL) tablet 50 mg  50 mg Oral QHS PRN Ajibola, Ene A, NP   50 mg at 01/26/23 1953   zonisamide (ZONEGRAN) capsule 300 mg  300 mg Oral Daily Bing Neighbors, NP   300 mg at 01/27/23 0803   Current Outpatient Medications  Medication Sig Dispense Refill   APTIOM 800 MG TABS Take 1,600 mg by mouth daily. 30 tablet 0   cetirizine (ZYRTEC) 10 MG tablet Take 10 mg by mouth daily as needed for allergies.     clonazePAM (KLONOPIN) 0.25 MG disintegrating tablet Take 0.25 mg by mouth daily as needed for seizure.     cloNIDine (CATAPRES) 0.1 MG tablet Take 0.1 mg by mouth daily as needed (For anxiety.).     mirtazapine (REMERON) 15 MG tablet  Take 15 mg by mouth at bedtime.     NAYZILAM 5 MG/0.1ML SOLN Place 5 mg into the nose daily as needed (For seizures lasting longer than 2 minutes. Call 911 if you use it.).     promethazine (PHENERGAN) 6.25 MG/5ML solution Take 25 mg by mouth daily as needed for nausea or vomiting.     risperiDONE (RISPERDAL) 1 MG tablet Take 1 tablet (1 mg total) by mouth 2 (two) times daily. 60 tablet 0   Vitamin D, Ergocalciferol, (DRISDOL) 1.25 MG (50000 UNIT) CAPS capsule Take 50,000 Units by mouth every  Sunday.     zonisamide (ZONEGRAN) 100 MG capsule Take 300 mg by mouth daily.      PTA Medications:  Facility Ordered Medications  Medication   [COMPLETED] LORazepam (ATIVAN) injection 1 mg   clonazePAM (KLONOPIN) disintegrating tablet 0.5 mg   acetaminophen (TYLENOL) tablet 650 mg   alum & mag hydroxide-simeth (MAALOX/MYLANTA) 200-200-20 MG/5ML suspension 30 mL   magnesium hydroxide (MILK OF MAGNESIA) suspension 30 mL   [COMPLETED] LORazepam (ATIVAN) tablet 2 mg   Or   [COMPLETED] LORazepam (ATIVAN) tablet 2 mg   Eslicarbazepine Acetate TABS 1,600 mg   zonisamide (ZONEGRAN) capsule 300 mg   [COMPLETED] OLANZapine zydis (ZYPREXA) disintegrating tablet 15 mg   risperiDONE (RISPERDAL) tablet 2 mg   carvedilol (COREG) tablet 6.25 mg   traZODone (DESYREL) tablet 50 mg   diphenhydrAMINE (BENADRYL) capsule 25 mg   risperiDONE (RISPERDAL M-TABS) disintegrating tablet 2 mg   And   LORazepam (ATIVAN) tablet 1 mg   And   ziprasidone (GEODON) injection 20 mg   divalproex (DEPAKOTE) DR tablet 500 mg   PTA Medications  Medication Sig   APTIOM 800 MG TABS Take 1,600 mg by mouth daily.   risperiDONE (RISPERDAL) 1 MG tablet Take 1 tablet (1 mg total) by mouth 2 (two) times daily.   cetirizine (ZYRTEC) 10 MG tablet Take 10 mg by mouth daily as needed for allergies.   promethazine (PHENERGAN) 6.25 MG/5ML solution Take 25 mg by mouth daily as needed for nausea or vomiting.   cloNIDine (CATAPRES) 0.1 MG tablet  Take 0.1 mg by mouth daily as needed (For anxiety.).   mirtazapine (REMERON) 15 MG tablet Take 15 mg by mouth at bedtime.   NAYZILAM 5 MG/0.1ML SOLN Place 5 mg into the nose daily as needed (For seizures lasting longer than 2 minutes. Call 911 if you use it.).   zonisamide (ZONEGRAN) 100 MG capsule Take 300 mg by mouth daily.   Vitamin D, Ergocalciferol, (DRISDOL) 1.25 MG (50000 UNIT) CAPS capsule Take 50,000 Units by mouth every Sunday.   clonazePAM (KLONOPIN) 0.25 MG disintegrating tablet Take 0.25 mg by mouth daily as needed for seizure.       11/13/2021   10:45 AM 06/01/2021   11:43 AM 04/13/2021   12:26 PM  Depression screen PHQ 2/9  Decreased Interest 1 0 0  Down, Depressed, Hopeless 0 0 0  PHQ - 2 Score 1 0 0    Flowsheet Row ED from 01/26/2023 in [facility] Most recent reading at 01/27/2023  9:54 AM ED from 01/26/2023 in [facility] Emergency Department at [address] Most recent reading at 01/26/2023 11:31 AM ED from 01/26/2023 in [facility] Most recent reading at 01/26/2023 10:24 AM  C-SSRS RISK CATEGORY No Risk No Risk No Risk       Musculoskeletal  Strength & Muscle Tone: within normal limits Gait & Station: normal Patient leans: N/A  Psychiatric Specialty Exam  Presentation  General Appearance:  Bizarre  Eye Contact: Fleeting  Speech: Slow  Speech Volume: Decreased  Handedness: -- (Unable to assess)   Mood and Affect  Mood: Labile  Affect: Congruent   Thought Process  Thought Processes: Disorganized  Descriptions of Associations:Tangential  Orientation:Other (comment) (unable to assess)  Thought Content:Other (comment) (Unable to assess)     Hallucinations:Hallucinations: Other (comment) (Appears to be responding to internal stimuli)  Ideas of Reference:Paranoia  Suicidal Thoughts:Suicidal Thoughts: -- (Unable to assess- uncooperative with exam)  Homicidal Thoughts:Homicidal  Thoughts: -- (  Unable to assess- uncooperative with exam)   Sensorium  Memory: -- (Unable to assess- uncooperative with exam)  Judgment: Impaired  Insight: None   Executive Functions  Concentration: Poor  Attention Span: Poor  Recall: Poor  Fund of Knowledge: Poor  Language: Poor   Psychomotor Activity  Psychomotor Activity: Psychomotor Activity: Restlessness   Assets  Assets: Physical Health; Transportation; Desire for Improvement   Sleep  Sleep: Sleep: -- (Unable to assess- uncooperative with exam)   No data recorded  Physical Exam  Physical Exam Vitals and nursing note reviewed.  Constitutional:      General: He is not in acute distress.    Appearance: He is well-developed.  HENT:     Head: Normocephalic and atraumatic.  Eyes:     General:        Right eye: No discharge.        Left eye: No discharge.     Conjunctiva/sclera: Conjunctivae normal.  Cardiovascular:     Rate and Rhythm: Normal rate.  Pulmonary:     Effort: Pulmonary effort is normal. No respiratory distress.  Musculoskeletal:        General: No swelling. Normal range of motion.     Cervical back: Normal range of motion.  Neurological:     Mental Status: He is alert and oriented to person, place, and time.  Psychiatric:        Attention and Perception: Attention and perception normal.        Mood and Affect: Mood is anxious. Affect is labile.        Speech: Speech normal.        Behavior: Behavior is cooperative.        Thought Content: Thought content is paranoid.        Cognition and Memory: Cognition normal.        Judgment: Judgment is impulsive.    Review of Systems  Constitutional: Negative.   HENT: Negative.    Eyes: Negative.   Respiratory: Negative.    Cardiovascular: Negative.   Musculoskeletal: Negative.   Skin: Negative.   Neurological: Negative.   Psychiatric/Behavioral:  Positive for substance abuse. The patient is nervous/anxious.    Blood pressure  123/83, pulse 66, temperature 98.1 F (36.7 C), temperature source Oral, resp. rate 18, SpO2 100 %. There is no height or weight on file to calculate BMI.  Disposition:  Patient continues to make criteria for inpatient psychiatric admission.  He has been accepted to [facility].  Recommendations while receiving inpatient treatment-Invega Sustenna injection and check patient's ammonia level.  Depakote 250 BID for mood stabilization   Ardis Hughs, NP 01/27/2023, 11:32 AM

## 2023-01-27 NOTE — ED Notes (Signed)
Pt reports that he has a vagal stimulator in his chest that he needs the magnet for to activate.  [name] mother [name] called at [phone] and said she would bring the magnet to [facility]  after she got out of a doctors appointment.

## 2023-01-27 NOTE — ED Notes (Signed)
Pt resting quietly with eyes closed.  No pain or discomfort noted/voiced.  Breathing is even and unlabored.  Will continue to monitor for safety.  

## 2023-01-27 NOTE — Progress Notes (Signed)
Pt was accepted *tentatively* to [facility] TODAY 01/27/2023, pending IVC paperwork faxed to [phone]. Bed assignment: 502-1  Pt meets inpatient criteria per Vernard Gambles, NP  Attending Physician will be Nadir Abbott Pao, MD  Report can be called to: - Adult unit: [phone]  Pt can arrive after pending items are received; [facility] AC to coordinate arrival time  Care Team Notified: [facility] Malva Limes, RN, Bedelia Person, RN, Vernard Gambles, NP, and [address] LCSWA  [place], [place]  01/27/2023 11:32 AM

## 2023-01-28 DIAGNOSIS — F312 Bipolar disorder, current episode manic severe with psychotic features: Secondary | ICD-10-CM | POA: Diagnosis not present

## 2023-01-28 DIAGNOSIS — G4700 Insomnia, unspecified: Secondary | ICD-10-CM | POA: Diagnosis present

## 2023-01-28 DIAGNOSIS — F1220 Cannabis dependence, uncomplicated: Secondary | ICD-10-CM | POA: Diagnosis present

## 2023-01-28 DIAGNOSIS — F411 Generalized anxiety disorder: Secondary | ICD-10-CM | POA: Diagnosis present

## 2023-01-28 MED ORDER — LORAZEPAM 2 MG/ML IJ SOLN: 2.0000 mg | Freq: Every day | INTRAMUSCULAR | Status: DC | PRN

## 2023-01-28 MED ORDER — PALIPERIDONE ER 3 MG PO TB24
3.0000 mg | ORAL_TABLET | Freq: Two times a day (BID) | ORAL | Status: DC
  Administered 2023-01-28 – 2023-02-04 (×14): 3 mg via ORAL
  Filled 2023-01-28 (×22): qty 1

## 2023-01-28 NOTE — Progress Notes (Signed)
Recreation Therapy Notes  INPATIENT RECREATION THERAPY ASSESSMENT  Patient Details Name: Dylan Moon MRN: [id number] DOB: [birth year] Today's Date: 01/28/2023       Information Obtained From: Patient  Able to Participate in Assessment/Interview: Yes  Patient Presentation: Alert (Disinterested)  Reason for Admission (Per Patient): Other (Comments) (Per chart; psychosis, aggression)  Patient Stressors:  (Did not give an answer)  Coping Skills:   TV, Sports, Exercise, Music, Talk, Prayer  Leisure Interests (2+):  Individual - Other (Comment) (Photography; Shoot music videos)  Frequency of Recreation/Participation: Other (Comment) (Daily)  Awareness of Community Resources:   (Pt did not answer)  County of Residence:  [place]  Patient Main Form of Transportation:    Patient Strengths:  Electronics engineer, Inteligent  Patient Identified Areas of Improvement:  eating  Patient Goal for Hospitalization:  "go home"  Current SI (including self-harm):   (No response)  Current HI:   (No response)  Current AVH:  (No response)  Staff Intervention Plan: Group Attendance, Collaborate with Interdisciplinary Treatment Team  Consent to Intern Participation: N/A   Dylan Moon, LRT,CTRS Dylan Moon A Dylan Moon 01/28/2023, 2:47 PM
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
## 2023-01-28 NOTE — Group Note (Signed)
Date:  01/28/2023 Time:  10:48 AM  Group Topic/Focus:  Goals Group:   The focus of this group is to help patients establish daily goals to achieve during treatment and discuss how the patient can incorporate goal setting into their daily lives to aide in recovery. Orientation:   The focus of this group is to educate the patient on the purpose and policies of crisis stabilization and provide a format to answer questions about their admission.  The group details unit policies and expectations of patients while admitted.    Participation Level:  Active  Participation Quality:  Appropriate  Affect:  Appropriate  Cognitive:  Appropriate  Insight: Appropriate  Engagement in Group:  Engaged  Modes of Intervention:  Discussion  Additional Comments:  Patient attended morning orientation group and said that his goal for today is to get himself together.   Tommye Lehenbauer W Markevion Lattin 01/28/2023, 10:48 AM
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
## 2023-01-28 NOTE — Progress Notes (Signed)
Pt focused on D/C pt educated on only person that can get pt out of [facility] is the doctor, pt encouraged to talk to the doctor about what he needs to do to get D/C    01/28/23 2000  Psych Admission Type (Psych Patients Only)  Admission Status Involuntary  Psychosocial Assessment  Patient Complaints Restlessness;Suspiciousness  Eye Contact Fair  Facial Expression Anxious  Affect Irritable;Preoccupied  Speech Argumentative;Aggressive  Interaction Demanding  Motor Activity Restless;Pacing  Appearance/Hygiene Disheveled;In scrubs  Behavior Characteristics Irritable;Restless;Pacing  Mood Preoccupied  Aggressive Behavior  Effect No apparent injury  Thought Process  Coherency Concrete thinking;Circumstantial  Content Preoccupation  Delusions Paranoid  Perception Hallucinations  Hallucination Auditory  Judgment Poor  Confusion None  Danger to Self  Current suicidal ideation? Denies  Danger to Others  Danger to Others None reported or observed  Danger to Others Abnormal  Harmful Behavior to others No threats or harm toward other people

## 2023-01-28 NOTE — Progress Notes (Signed)
After talking to pt about process and how to manage through his Tx, pt appeared more pleasant and was more cooperative on the unit this evening. Pt compliant with HS medications

## 2023-01-28 NOTE — Group Note (Signed)
Recreation Therapy Group Note   Group Topic:Team Building  Group Date: 01/28/2023 Start Time: 1041 End Time: 1120 Facilitators: Cameren Earnest-McCall, LRT,CTRS Location: [address] Dayroom   Goal Area(s) Addresses:  Patient will effectively work with peer towards shared goal.  Patient will identify skills used to make activity successful.  Patient will identify how skills used during activity can be used to reach post d/c goals.   Group Description: Landing Pad. In teams of 3-5, patients were given 12 plastic drinking straws and an equal length of masking tape. Using the materials provided, patients were asked to build a landing pad to catch a golf ball dropped from approximately 5 feet in the air. All materials were required to be used by the team in their design. LRT facilitated post-activity discussion.   Affect/Mood: Labile   Participation Level: Minimal   Participation Quality: Minimal Cues   Behavior: Distracted and Disinterested   Speech/Thought Process: Rational   Insight: Lacking   Judgement: Lacking    Modes of Intervention: STEM Activity   Patient Response to Interventions:  Disengaged   Education Outcome:  Acknowledges education   Clinical Observations/Individualized Feedback: Pt was minimally involved in the activity.  Pt was more focused on going home.  Pt also stated he was missing out on making $500 for taking some photos for someone.  Pt would engage when redirected for short periods of time.     Plan: Continue to engage patient in RT group sessions 2-3x/week.   Porter Nakama-McCall, LRT,CTRS  01/28/2023 12:42 PM

## 2023-01-28 NOTE — BHH Group Notes (Signed)
Adult Psychoeducational Group Note  Date:  01/28/2023 Time:  9:00 PM  Group Topic/Focus:  Wrap-Up Group:   The focus of this group is to help patients review their daily goal of treatment and discuss progress on daily workbooks.  Participation Level:  Active  Participation Quality:  Appropriate  Affect:  Appropriate  Cognitive:  Disorganized  Insight: Appropriate  Engagement in Group:  Limited  Modes of Intervention:  Discussion  Additional Comments:  Pt stated his goal for today was to focus on his treatment plan. Pt stated he accomplished his goal today. Pt stated he talked with his doctor and social worker about his care today. Pt rated his overall day a 10. Pt stated he enjoyed going outside with his peers today.Pt stated he was able to contact his parents and a couple friends today. Pt stated his girlfriend coming for visitation tonight improved his overall day. Pt stated he felt better about himself today. Pt stated he was able to attend all meals. Pt stated he took all medications provided today. Pt stated he attend all groups held today. Pt stated his appetite was pretty good today. Pt rated sleep last night was pretty good. Pt stated the goal tonight was to get some rest. Pt stated he had no physical pain tonight. Pt deny visual hallucinations and auditory issues tonight. Pt denies thoughts of harming himself or others. Pt stated he would alert staff if anything changed  Felipa Furnace 01/28/2023, 9:00 PM

## 2023-01-28 NOTE — Progress Notes (Signed)
Dar Note: Patient presents with irritable affect and agitated this morning.  Demanding to leave. Stated, "I don't belong here with these crazy people; I have a job and places to go." Called his mom several times on the phone asking her to pick him up.  Patient had to be redirected several times.  PRN Haldol, Ativan and Benadryl given with fair effect.  Routine safety checks maintained.  Patient visible in milieu interacting with staff and peers.  No seizure activities reported or noted.  Support and encouragement offered as needed.  Patient is safe on and off the unit.
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
## 2023-01-29 ENCOUNTER — Encounter (HOSPITAL_COMMUNITY): Payer: Self-pay

## 2023-01-29 DIAGNOSIS — F312 Bipolar disorder, current episode manic severe with psychotic features: Secondary | ICD-10-CM | POA: Diagnosis not present

## 2023-01-29 LAB — CBC WITH DIFFERENTIAL/PLATELET
Abs Immature Granulocytes: 0.02 10*3/uL (ref 0.00–0.07)
Basophils Absolute: 0 10*3/uL (ref 0.0–0.1)
Basophils Relative: 0 %
Eosinophils Absolute: 0.1 10*3/uL (ref 0.0–0.5)
Eosinophils Relative: 1 %
HCT: 49.7 % (ref 39.0–52.0)
Hemoglobin: 16.1 g/dL (ref 13.0–17.0)
Immature Granulocytes: 0 %
Lymphocytes Relative: 38 %
Lymphs Abs: 2.8 10*3/uL (ref 0.7–4.0)
MCH: 27.3 pg (ref 26.0–34.0)
MCHC: 32.4 g/dL (ref 30.0–36.0)
MCV: 84.2 fL (ref 80.0–100.0)
Monocytes Absolute: 0.4 10*3/uL (ref 0.1–1.0)
Monocytes Relative: 6 %
Neutro Abs: 4 10*3/uL (ref 1.7–7.7)
Neutrophils Relative %: 55 %
Platelets: 243 10*3/uL (ref 150–400)
RBC: 5.9 MIL/uL — ABNORMAL HIGH (ref 4.22–5.81)
RDW: 12.5 % (ref 11.5–15.5)
WBC: 7.4 10*3/uL (ref 4.0–10.5)
nRBC: 0 % (ref 0.0–0.2)

## 2023-01-29 LAB — AMMONIA: Ammonia: 34 umol/L (ref 9–35)

## 2023-01-29 LAB — VITAMIN D 25 HYDROXY (VIT D DEFICIENCY, FRACTURES): Vit D, 25-Hydroxy: 36.58 ng/mL (ref 30–100)

## 2023-01-29 LAB — TSH: TSH: 0.706 u[IU]/mL (ref 0.350–4.500)

## 2023-01-29 MED ORDER — DIVALPROEX SODIUM 250 MG PO DR TAB
250.0000 mg | DELAYED_RELEASE_TABLET | Freq: Once | ORAL | Status: AC
  Administered 2023-01-29: 250 mg via ORAL
  Filled 2023-01-29: qty 1

## 2023-01-29 MED ORDER — DIVALPROEX SODIUM 500 MG PO DR TAB
500.0000 mg | DELAYED_RELEASE_TABLET | Freq: Two times a day (BID) | ORAL | Status: DC
  Administered 2023-01-29 – 2023-02-04 (×12): 500 mg via ORAL
  Filled 2023-01-29 (×19): qty 1

## 2023-01-29 MED ORDER — VALPROIC ACID 250 MG PO CAPS
250.0000 mg | ORAL_CAPSULE | Freq: Once | ORAL | Status: DC
  Filled 2023-01-29: qty 1

## 2023-01-29 NOTE — Progress Notes (Signed)
Adult Psychoeducational Group Note  Date:  01/29/2023 Time:  9:09 PM  Group Topic/Focus:  Goals Group:   The focus of this group is to help patients establish daily goals to achieve during treatment and discuss how the patient can incorporate goal setting into their daily lives to aide in recovery.  Participation Level:  Active  Participation Quality:  Appropriate  Affect:  Appropriate  Cognitive:  Appropriate  Insight: Appropriate  Engagement in Group:  Engaged  Modes of Intervention:  Discussion  Additional Comments:  Pt stated his day was alright.  Pt goal for the day was to make people smile. Pt met goal.  Wynema Birch D 01/29/2023, 9:09 PM

## 2023-01-29 NOTE — Group Note (Signed)
Recreation Therapy Group Note   Group Topic:Goal Setting  Group Date: 01/29/2023 Start Time: 1015 End Time: 1052 Facilitators: Negar Sieler-McCall, LRT,CTRS Location: 500 Hall Dayroom   Goal Area(s) Addresses:  Patient will identify long-term goal they wish to accomplish.  Patient will reflect on short-term steps and support they will need to reach their goal(s).  Group Description: DREAM. Patients were provided a sheet with the word dream spelled out in large block letters.  Patients were to write all the goals they wish to accomplish inside the letters that spell dream.  Patients would then write what is preventing them from reaching their goals on the outside of the letters. Lastly, patients would identify ways to overcome the challenges they face.    Affect/Mood: Anxious   Participation Level: Active   Participation Quality: Moderate Cues   Behavior: Impulsive   Speech/Thought Process: Grandiose   Insight: Limited   Judgement: Limited   Modes of Intervention: Worksheet   Patient Response to Interventions:  Engaged   Education Outcome:  Acknowledges education   Clinical Observations/Individualized Feedback: Dylan Moon had a hard time sitting still.  Dylan Moon was distracted, looking into the hallway focusing more on a peer, trying to insert himself into that situation.  LRT had to redirect Dylan Moon multiple times to focus on the group session.  Dylan Moon identified his goals as wanting to move his mother into a better house and car.  Dylan Moon also wants to get better homes and cars for his sisters, nieces and nephews.  Dylan Moon also expressed wanting to move to [facility].  Dylan Moon ultimate dream is to become a Environmental manager for the [employer].  Dylan Moon identified his biggest challenge is needing more support.      Plan: Continue to engage patient in RT group sessions 2-3x/week.   Dylan Moon, LRT,CTRS 01/29/2023 11:31 AM
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
## 2023-01-29 NOTE — BH IP Treatment Plan (Signed)
Interdisciplinary Treatment and Diagnostic Plan Update  01/29/2023 Time of Session: 10:50 AM  Dylan Moon MRN: [id number]  Principal Diagnosis: Bipolar I disorder, severe, current or most recent episode manic, with psychotic features (HCC)  Secondary Diagnoses: Principal Problem:   Bipolar I disorder, severe, current or most recent episode manic, with psychotic features (HCC) Active Problems:   Seizures (HCC)   Delta-9-tetrahydrocannabinol (THC) dependence (HCC)   GAD (generalized anxiety disorder)   Insomnia   Current Medications:  Current Facility-Administered Medications  Medication Dose Route Frequency Provider Last Rate Last Admin   acetaminophen (TYLENOL) tablet 650 mg  650 mg Oral Q6H PRN Ardis Hughs, NP       alum & mag hydroxide-simeth (MAALOX/MYLANTA) 200-200-20 MG/5ML suspension 30 mL  30 mL Oral Q4H PRN Ardis Hughs, NP       carvedilol (COREG) tablet 6.25 mg  6.25 mg Oral BID WC Ardis Hughs, NP   6.25 mg at 01/29/23 0751   diphenhydrAMINE (BENADRYL) capsule 50 mg  50 mg Oral TID PRN Ardis Hughs, NP   50 mg at 01/29/23 1434   Or   diphenhydrAMINE (BENADRYL) injection 50 mg  50 mg Intramuscular TID PRN Ardis Hughs, NP       divalproex (DEPAKOTE) DR tablet 500 mg  500 mg Oral BID Armandina Stammer I, NP       Eslicarbazepine Acetate TABS 1,600 mg  1,600 mg Oral Daily Ardis Hughs, NP   1,600 mg at 01/29/23 0751   haloperidol (HALDOL) tablet 5 mg  5 mg Oral TID PRN Ardis Hughs, NP   5 mg at 01/29/23 1433   Or   haloperidol lactate (HALDOL) injection 5 mg  5 mg Intramuscular TID PRN Ardis Hughs, NP       loratadine (CLARITIN) tablet 10 mg  10 mg Oral Daily Ardis Hughs, NP   10 mg at 01/29/23 0750   LORazepam (ATIVAN) tablet 2 mg  2 mg Oral TID PRN Ardis Hughs, NP   2 mg at 01/29/23 1433   Or   LORazepam (ATIVAN) injection 2 mg  2 mg Intramuscular TID PRN Ardis Hughs, NP       LORazepam (ATIVAN)  injection 2 mg  2 mg Intramuscular Daily PRN [name], NP       magnesium hydroxide (MILK OF MAGNESIA) suspension 30 mL  30 mL Oral Daily PRN Ardis Hughs, NP       paliperidone (INVEGA) 24 hr tablet 3 mg  3 mg Oral BID [name], NP   3 mg at 01/29/23 0749   traZODone (DESYREL) tablet 50 mg  50 mg Oral QHS PRN Ardis Hughs, NP   50 mg at 01/28/23 2040   valproic acid (DEPAKENE) 250 MG capsule 250 mg  250 mg Oral Once Armandina Stammer I, NP       zonisamide (ZONEGRAN) capsule 300 mg  300 mg Oral Daily Ardis Hughs, NP   300 mg at 01/29/23 4098   PTA Medications: Medications Prior to Admission  Medication Sig Dispense Refill Last Dose   carvedilol (COREG) 6.25 MG tablet Take 1 tablet (6.25 mg total) by mouth 2 (two) times daily with a meal.      cetirizine (ZYRTEC) 10 MG tablet Take 10 mg by mouth daily as needed for allergies.      clonazePAM (KLONOPIN) 0.5 MG disintegrating tablet Take 1 tablet (0.5 mg total) by mouth 3 (three) times daily as needed (  agitation or seizure like activity). 60 tablet 0    cloNIDine (CATAPRES) 0.1 MG tablet Take 0.1 mg by mouth daily as needed (For anxiety.).      divalproex (DEPAKOTE) 250 MG DR tablet Take 1 tablet (250 mg total) by mouth 2 (two) times daily.      Eslicarbazepine Acetate (APTIOM) 800 MG TABS Take 1,600 mg by mouth daily. 30 tablet     risperiDONE (RISPERDAL) 2 MG tablet Take 1 tablet (2 mg total) by mouth 2 (two) times daily.      traZODone (DESYREL) 50 MG tablet Take 1 tablet (50 mg total) by mouth at bedtime as needed for sleep.      Vitamin D, Ergocalciferol, (DRISDOL) 1.25 MG (50000 UNIT) CAPS capsule Take 50,000 Units by mouth every Sunday.      zonisamide (ZONEGRAN) 100 MG capsule Take 3 capsules (300 mg total) by mouth daily.       Patient Stressors: Marital or family conflict   Medication change or noncompliance   Substance abuse    Patient Strengths: Automotive engineer for treatment/growth   Supportive family/friends   Treatment Modalities: Medication Management, Group therapy, Case management,  1 to 1 session with clinician, Psychoeducation, Recreational therapy.   Physician Treatment Plan for Primary Diagnosis: Bipolar I disorder, severe, current or most recent episode manic, with psychotic features (HCC) Long Term Goal(s): Improvement in symptoms so as ready for discharge   Short Term Goals: Ability to identify changes in lifestyle to reduce recurrence of condition will improve Ability to verbalize feelings will improve Ability to demonstrate self-control will improve Ability to identify and develop effective coping behaviors will improve Ability to maintain clinical measurements within normal limits will improve Compliance with prescribed medications will improve Ability to identify triggers associated with substance abuse/mental health issues will improve  Medication Management: Evaluate patient's response, side effects, and tolerance of medication regimen.  Therapeutic Interventions: 1 to 1 sessions, Unit Group sessions and Medication administration.  Evaluation of Outcomes: Not Progressing  Physician Treatment Plan for Secondary Diagnosis: Principal Problem:   Bipolar I disorder, severe, current or most recent episode manic, with psychotic features (HCC) Active Problems:   Seizures (HCC)   Delta-9-tetrahydrocannabinol (THC) dependence (HCC)   GAD (generalized anxiety disorder)   Insomnia  Long Term Goal(s): Improvement in symptoms so as ready for discharge   Short Term Goals: Ability to identify changes in lifestyle to reduce recurrence of condition will improve Ability to verbalize feelings will improve Ability to demonstrate self-control will improve Ability to identify and develop effective coping behaviors will improve Ability to maintain clinical measurements within normal limits will improve Compliance with prescribed medications will improve Ability  to identify triggers associated with substance abuse/mental health issues will improve     Medication Management: Evaluate patient's response, side effects, and tolerance of medication regimen.  Therapeutic Interventions: 1 to 1 sessions, Unit Group sessions and Medication administration.  Evaluation of Outcomes: Not Progressing   RN Treatment Plan for Primary Diagnosis: Bipolar I disorder, severe, current or most recent episode manic, with psychotic features (HCC) Long Term Goal(s): Knowledge of disease and therapeutic regimen to maintain health will improve  Short Term Goals: Ability to remain free from injury will improve, Ability to verbalize frustration and anger appropriately will improve, Ability to demonstrate self-control, Ability to participate in decision making will improve, Ability to verbalize feelings will improve, Ability to disclose and discuss suicidal ideas, Ability to identify and develop effective coping behaviors will improve,  and Compliance with prescribed medications will improve  Medication Management: RN will administer medications as ordered by provider, will assess and evaluate patient's response and provide education to patient for prescribed medication. RN will report any adverse and/or side effects to prescribing provider.  Therapeutic Interventions: 1 on 1 counseling sessions, Psychoeducation, Medication administration, Evaluate responses to treatment, Monitor vital signs and CBGs as ordered, Perform/monitor CIWA, COWS, AIMS and Fall Risk screenings as ordered, Perform wound care treatments as ordered.  Evaluation of Outcomes: Not Progressing   LCSW Treatment Plan for Primary Diagnosis: Bipolar I disorder, severe, current or most recent episode manic, with psychotic features (HCC) Long Term Goal(s): Safe transition to appropriate next level of care at discharge, Engage patient in therapeutic group addressing interpersonal concerns.  Short Term Goals: Engage  patient in aftercare planning with referrals and resources, Increase social support, Increase ability to appropriately verbalize feelings, Increase emotional regulation, Facilitate acceptance of mental health diagnosis and concerns, Facilitate patient progression through stages of change regarding substance use diagnoses and concerns, Identify triggers associated with mental health/substance abuse issues, and Increase skills for wellness and recovery  Therapeutic Interventions: Assess for all discharge needs, 1 to 1 time with Social worker, Explore available resources and support systems, Assess for adequacy in community support network, Educate family and significant other(s) on suicide prevention, Complete Psychosocial Assessment, Interpersonal group therapy.  Evaluation of Outcomes: Not Progressing   Progress in Treatment: Attending groups: Yes. Participating in groups: Yes. Taking medication as prescribed: Yes. Toleration medication: Yes. Family/Significant other contact made: Yes, individual(s) contacted:  [name] (Mother) [phone] Patient understands diagnosis: No. Discussing patient identified problems/goals with staff: Yes. Medical problems stabilized or resolved: Yes. Denies suicidal/homicidal ideation: Yes. Issues/concerns per patient self-inventory: No.   New problem(s) identified: No, Describe:  None reported   New Short Term/Long Term Goal(s): medication stabilization, elimination of SI thoughts, development of comprehensive mental wellness plan.   Patient Goals:  " Take my medications "   Discharge Plan or Barriers: Patient recently admitted. CSW will continue to follow and assess for appropriate referrals and possible discharge planning.    Reason for Continuation of Hospitalization: Aggression Anxiety Delusions  Depression Mania Medication stabilization  Estimated Length of Stay: 5-7 days   Last 3 [place] Suicide Severity Risk Score: Flowsheet Row Admission  (Current[facility] Most recent reading at 01/27/2023 10:15 PM ED from 01/26/2023 in [facility] Most recent reading at 01/27/2023  9:54 AM ED from 01/26/2023 in [facility] Department at [facility] Most recent reading at 01/26/2023 11:31 AM  C-SSRS RISK CATEGORY No Risk No Risk No Risk       Last PHQ 2/9 Scores:    11/13/2021   10:45 AM 06/01/2021   11:43 AM 04/13/2021   12:26 PM  Depression screen PHQ 2/9  Decreased Interest 1 0 0  Down, Depressed, Hopeless 0 0 0  PHQ - 2 Score 1 0 0    Scribe for Treatment Team: Beather Arbour 01/29/2023 2:36 PM

## 2023-01-29 NOTE — [facility] Suicide Risk Assessment (Signed)
[facility] INPATIENT:  Family/Significant Other Suicide Prevention Education  Suicide Prevention Education:  Education Completed; [name], Mother, [name] (Mother) [phone]  (name of family member/significant other) has been identified by the patient as the family member/significant other with whom the patient will be residing, and identified as the person(s) who will aid the patient in the event of a mental health crisis (suicidal ideations/suicide attempt).  With written consent from the patient, the family member/significant other has been provided the following suicide prevention education, prior to the and/or following the discharge of the patient.  Patient will be returning to stay with mother. She reports that he has had increased paranoia ever since a seizure about a month ago.  Mother states that he is not at baseline at this time. No guns/weapons.  Mother concerned about medications he was on and CSW agreed to inform doctor that she would like to speak with them. No guns/weapons at this time.  The suicide prevention education provided includes the following: Suicide risk factors Suicide prevention and interventions National Suicide Hotline telephone number [facility] assessment telephone number [facility] Emergency Assistance [facility] and/or Residential Mobile Crisis Unit telephone number  Request made of family/significant other to: Remove weapons (e.g., guns, rifles, knives), all items previously/currently identified as safety concern.   Remove drugs/medications (over-the-counter, prescriptions, illicit drugs), all items previously/currently identified as a safety concern.  The family member/significant other verbalizes understanding of the suicide prevention education information provided.  The family member/significant other agrees to remove the items of safety concern listed above.  Dylan Moon E Dylan Moon 01/29/2023, 1:39 PM

## 2023-01-29 NOTE — Progress Notes (Signed)
   01/29/23 1214  Psych Admission Type (Psych Patients Only)  Admission Status Involuntary  Psychosocial Assessment  Patient Complaints Restlessness;Worrying;Irritability  Eye Contact Fair  Facial Expression Anxious  Affect Preoccupied;Irritable  Radiation protection practitioner;Hypervigilant  Motor Activity Restless;Pacing  Appearance/Hygiene Disheveled  Behavior Characteristics Irritable  Mood Preoccupied  Thought Process  Coherency Concrete thinking;Circumstantial  Content Preoccupation  Delusions Paranoid  Perception Derealization  Hallucination None reported or observed  Judgment Poor  Confusion None  Danger to Self  Current suicidal ideation? Denies  Danger to Others  Danger to Others None reported or observed  Danger to Others Abnormal  Harmful Behavior to others No threats or harm toward other people

## 2023-01-29 NOTE — Progress Notes (Signed)
Patient ID: Dylan Moon, male   DOB: Jun 18, 1996, 27 y.o.   MRN: [id number]   Pt on unit agitated about another pt being in seclusion. Pt attempted to go into seclusion room and began cursing and yelling at staff when redirected. Pt stated that he needed to save him and help him. "Let my brother out!" Benadryl 50 mg PRN, Haldol 5 mg PRN, and Ativan 2 mg PRN given.

## 2023-01-29 NOTE — Progress Notes (Signed)
Pt continues to ask about D/C . Pt stated he wants to continue to take his medication when he D/C , pt said he would like to be stabilized enough so he could work . Pt concerned with the increase in the Depakote due to his hx of increased ammonia levels while on Depakote. Pt encouraged to talk to the doctor about his D/c plans    01/29/23 2100  Psych Admission Type (Psych Patients Only)  Admission Status Involuntary  Psychosocial Assessment  Patient Complaints Anxiety;Restlessness;Worrying  Eye Contact Fair  Facial Expression Anxious  Affect Irritable;Preoccupied  Speech Argumentative;Aggressive  Interaction Demanding  Motor Activity Restless;Pacing  Appearance/Hygiene Disheveled;In scrubs  Behavior Characteristics Anxious;Irritable  Mood Suspicious;Preoccupied  Aggressive Behavior  Effect No apparent injury  Thought Process  Coherency Concrete thinking;Circumstantial  Content Preoccupation  Delusions Paranoid  Perception Hallucinations  Hallucination Auditory  Judgment Poor  Confusion None  Danger to Self  Current suicidal ideation? Denies  Danger to Others  Danger to Others None reported or observed  Danger to Others Abnormal  Harmful Behavior to others No threats or harm toward other people

## 2023-01-30 LAB — LIPID PANEL
Cholesterol: 151 mg/dL (ref 0–200)
HDL: 41 mg/dL (ref >40)
LDL Cholesterol: 91 mg/dL (ref 0–99)
Total CHOL/HDL Ratio: 3.7 RATIO
Triglycerides: 93 mg/dL (ref <150)
VLDL: 19 mg/dL (ref 0–40)

## 2023-01-30 LAB — HEMOGLOBIN A1C
Hgb A1c MFr Bld: 5.5 % (ref 4.8–5.6)
Mean Plasma Glucose: 111.15 mg/dL

## 2023-01-30 NOTE — Group Note (Signed)
Date:  01/30/2023 Time:  9:48 AM  Group Topic/Focus:  Goals Group:   The focus of this group is to help patients establish daily goals to achieve during treatment and discuss how the patient can incorporate goal setting into their daily lives to aide in recovery.    Participation Level:  Active  Participation Quality:  Attentive  Affect:  Appropriate  Cognitive:  Appropriate  Insight: Appropriate  Engagement in Group:  Engaged  Modes of Intervention:  Discussion  Additional Comments:    Dylan Moon 01/30/2023, 9:48 AM

## 2023-01-30 NOTE — Progress Notes (Signed)
Patient appears animated and bright mood/affect. Patient stated he slept well and stated having no anxiety or depression on a scale of 0 to 10. Patient denies SI,HI, and A/V/H with no plan or intent although noted to still make grandiose statements. Patient's goal for today is to "make my day better than yesterday" and to "focus on my mental health." Patient has attended groups, med compliant, and remains cooperative on unit.

## 2023-01-30 NOTE — Group Note (Signed)
Recreation Therapy Group Note   Group Topic:Health and Wellness  Group Date: 01/30/2023 Start Time: 1010 End Time: 1045 Facilitators: Tirth Cothron-McCall, LRT,CTRS Location: [address] Dayroom   Goal Area(s) Addresses:  Patient will verbalize benefit of exercise during group session. Patient will identify an exercise that can be completed post d/c.  Group Description:  Exercise.  LRT and patients discussed the importance of exercise and benefits.  Patients then completed a round of stretches to get loose for the next phase of the activity.  Patients then took turns leading the group in the exercises of their choosing.  Patients were encouraged to get water or take breaks when needed.   Affect/Mood: Happy   Participation Level: Engaged   Participation Quality: Moderate Cues   Behavior: Appropriate   Speech/Thought Process: Focused   Insight: Good   Judgement: Good   Modes of Intervention: Music and Exercise   Patient Response to Interventions:  Engaged   Education Outcome:  Acknowledges education   Clinical Observations/Individualized Feedback: Pt was bright, engaged and interacted well with peers.  Pt had to be redirected a few times to calm down.  Pt was a little to energetic at times.  Pt was appropriate, would encourage peers and was receptive to correction when needed.      Plan: Continue to engage patient in RT group sessions 2-3x/week.   Solomia Harrell-McCall, LRT,CTRS 01/30/2023 11:26 AM
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
## 2023-01-30 NOTE — Progress Notes (Signed)
Pt was feeding off another pt on the unit escalating, pt was verbally de-escalated, pt continues to be focused on D/C, pt educated on the Importance of his Depakote level being therapeutic.    01/30/23 2015  Psych Admission Type (Psych Patients Only)  Admission Status Involuntary  Psychosocial Assessment  Patient Complaints Anxiety;Restlessness  Eye Contact Fair  Facial Expression Anxious  Affect Irritable;Preoccupied  Speech Argumentative;Aggressive  Interaction Demanding  Motor Activity Restless;Pacing  Appearance/Hygiene Disheveled;In scrubs  Behavior Characteristics Cooperative;Anxious;Agitated  Mood Labile;Anxious  Aggressive Behavior  Effect No apparent injury  Thought Process  Coherency Concrete thinking;Circumstantial  Content Preoccupation  Delusions Paranoid  Perception Hallucinations  Hallucination Auditory  Judgment Poor  Confusion None  Danger to Self  Current suicidal ideation? Denies  Danger to Others  Danger to Others None reported or observed  Danger to Others Abnormal  Harmful Behavior to others No threats or harm toward other people

## 2023-01-31 LAB — VALPROIC ACID LEVEL: Valproic Acid Lvl: 103 ug/mL — ABNORMAL HIGH (ref 50.0–100.0)

## 2023-01-31 NOTE — Progress Notes (Signed)
DAR NOTE: Patient presents with anxious affect and mood.  Denies suicidal ideation, auditory and visual hallucinations.  Rates depression at 0, hopelessness at 0, and anxiety at 0.  Maintained on routine safety checks.  Medications given as prescribed.  Support and encouragement offered as needed.  Attended group and participated.  States goal for today is "work on focusing on myself."  Patient observed socializing with peers in the dayroom.  Offered no complaint.

## 2023-01-31 NOTE — Progress Notes (Signed)
Patient has been up in the dayroom, attended group tonight and participated. He reported that his girlfriend came to visit him tonight. He is pleasant yet grandiose promising staff [employer] and asking when we want it.  Support given and safety maintained with 15 min checks.  01/31/23 2030  Psych Admission Type (Psych Patients Only)  Admission Status Involuntary  Psychosocial Assessment  Patient Complaints None  Eye Contact Fair  Facial Expression Anxious  Affect Appropriate to circumstance;Anxious  Speech Logical/coherent  Interaction Assertive  Motor Activity Restless  Appearance/Hygiene Improved  Behavior Characteristics Cooperative;Appropriate to situation  Mood Anxious;Pleasant  Thought Process  Coherency Circumstantial  Content Preoccupation  Delusions Grandeur  Perception Derealization  Hallucination None reported or observed  Judgment Poor  Confusion None  Danger to Self  Current suicidal ideation? Denies
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
## 2023-01-31 NOTE — Progress Notes (Signed)
Dylan Moon did not attend wrap up group

## 2023-01-31 NOTE — Group Note (Signed)
Recreation Therapy Group Note   Group Topic:Problem Solving  Group Date: 01/31/2023 Start Time: 1000 End Time: 1030 Facilitators: Sayward Horvath-McCall, LRT,CTRS Location: [address] Dayroom   Goal Area(s) Addresses:  Patient will effectively work with peer towards shared goal.  Patient will identify skill used to make activity successful.  Patient will identify how skills used during activity can be used to reach post d/c goals.    Group Description:  In team's, using 20 small plastic cups, patients were asked to build the tallest free standing tower possible.   Affect/Mood: Appropriate and Anxious   Participation Level: Active   Participation Quality: Minimal Cues   Behavior: Attentive  and Distracted   Speech/Thought Process: Distracted   Insight: Moderate   Judgement: Moderate   Modes of Intervention: STEM Activity   Patient Response to Interventions:  Attentive   Education Outcome:  Acknowledges education   Clinical Observations/Individualized Feedback: Pt came into group attentive but was easily distracted wanting to speak to the doctor.  Pt would focus for short periods of time but was very much concerned with speaking with doctor.  Pt needed redirection when he would get off task but would get back on task.  Pt couldn't sit still when he was focused on the doctor.  Overall, pt was appropriate in group session.     Plan: Continue to engage patient in RT group sessions 2-3x/week.   Adylin Hankey-McCall, LRT,CTRS 01/31/2023 10:54 AM

## 2023-02-01 MED ORDER — CARVEDILOL 12.5 MG PO TABS
12.5000 mg | ORAL_TABLET | Freq: Two times a day (BID) | ORAL | Status: DC
  Administered 2023-02-01 – 2023-02-03 (×4): 12.5 mg via ORAL
  Filled 2023-02-01 (×8): qty 1

## 2023-02-01 MED ORDER — PALIPERIDONE PALMITATE ER 234 MG/1.5ML IM SUSY
234.0000 mg | PREFILLED_SYRINGE | Freq: Once | INTRAMUSCULAR | Status: AC
  Administered 2023-02-01: 234 mg via INTRAMUSCULAR
  Filled 2023-02-01: qty 1.5

## 2023-02-01 NOTE — Plan of Care (Signed)
  Problem: Coping: Goal: Ability to demonstrate self-control will improve Outcome: Progressing   Problem: Safety: Goal: Periods of time without injury will increase Outcome: Progressing   Problem: Activity: Goal: Will verbalize the importance of balancing activity with adequate rest periods Outcome: Progressing   

## 2023-02-01 NOTE — Group Note (Signed)
Date:  02/01/2023 Time:  8:28 PM  Group Topic/Focus:  Wrap-Up Group:   The focus of this group is to help patients review their daily goal of treatment and discuss progress on daily workbooks.    Participation Level:  Active  Participation Quality:  Appropriate  Affect:  Appropriate  Cognitive:  Appropriate  Insight: Appropriate  Engagement in Group:  Engaged  Modes of Intervention:  Education and Exploration  Additional Comments:  Patient attended and participated in group tonight. He reports making all the nurses smile was important to him today.  Lita Mains [facility] 02/01/2023, 8:28 PM

## 2023-02-01 NOTE — Progress Notes (Addendum)
[facility] MD Progress Note  02/01/2023 3:32 PM Dylan Moon  MRN:  [id number]  Reason for admission:Reason For Admission: Dylan Moon is a 27 yo AAM with past mental health diagnoses of ADHD, PTSD, bipolar d/o, GAD & a medical diagnosis of seizure d/o who presented to the [facility] health urgent care with law enforcement accompanied by his mother on 05/03 after she involuntarily committed him for aggressive behaviors & paranoia; As her documentation from the [facility], pt broke a porcelain toilet at home with his bare hands, thought someone was trying to set him up, and thought that there were fake people on his social media account. [name] mother reported that aggression started his most recent seizure activity on 05/03, and as per chart review, he was seen and treated in the ER for seizures on 05/03. For  this admission, pt was transferred to this [facility] for treatment and stabilization of his mental status.   24 hr chart review: Sleep Hours last night: 8 hrs as per nursing, documentation Nursing Concerns: none documented  Behavioral episodes in the past 24 UUV:OZDG  Medication Compliance:Compliant  Vital Signs in the past 24 hrs: Persistently elevated BP with SBP in the 140s, since admission. 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 He however, is more logical in his response to questions, is more coherence, and is more organized. Speech is clear and not pressured. Patient denies SI, denies HI, denies AVH, and is able to recognize that he was paranoid on admission; When asked questions regarding paranoia, he states, I was before, but I am not any more.  [name] mother had wanted for him to be off Depakote at admission due to medication previously causing elevated ammonia levels. This medication was continued  at a lower dose of 250 mg BID on admission, due to a, h/o seizures and not wanting to take pt off the medication too fast due to a risk of seizures. Over the past couple of days, this medication has been increased, and seems to be helping with pt's mood. Medication is currently at 500 mg BID, and last Valproic acid level on 5/10 was 103. Ammonia level 3 days ago was WNL at 34. We are completing another level tomorrow along with LFTs, and will make adjustments as necessary once labs return.  Pt and mother were interested in LAI on admission. Invega 234 mg IM ordered to be administed today. Projected discharge date is Tuesday, 5/14 as long as there are plans in place for outpatient f/u. Coreg increased from 6.25 to 12.5 for management of htn.  Principal Problem: Bipolar I disorder, severe, current or most recent episode manic, with psychotic features (HCC)  Diagnosis: Principal Problem:   Bipolar I disorder, severe, current or most recent episode manic, with psychotic features (HCC) Active Problems:   Seizures (HCC)   Delta-9-tetrahydrocannabinol (THC) dependence (HCC)   GAD (generalized anxiety disorder)   Insomnia  Total Time spent with patient:  35 minutes  Past Psychiatric History: See H&P.  Past Medical History:  Past Medical History:  Diagnosis Date   ADHD    Bipolar 1 disorder (HCC)    Schizoaffective disorder (HCC)    Scoliosis    Seizures (HCC)    most recent 12/02/17    Past Surgical History:  Procedure Laterality Date  NO PAST SURGERIES     Family History:  Family History  Problem Relation Age of Onset   Diabetes Mother    Hypertension Mother    Cancer Other    Diabetes Father    Seizures Maternal Grandfather    Family Psychiatric  History: See H&P.  Social History:  Social History   Substance and Sexual Activity  Alcohol Use Not Currently     Social History   Substance and Sexual Activity  Drug Use Yes   Types: Marijuana   Comment: Daily use of  marijuana.    Social History   Socioeconomic History   Marital status: Single    Spouse name: Not on file   Number of children: 0   Years of education: HS   Highest education level: Not on file  Occupational History   Occupation: Product manager of videos  Tobacco Use   Smoking status: Every Day    Types: Cigars   Smokeless tobacco: Never  Vaping Use   Vaping Use: Never used  Substance and Sexual Activity   Alcohol use: Not Currently   Drug use: Yes    Types: Marijuana    Comment: Daily use of marijuana.   Sexual activity: Not Currently  Other Topics Concern   Not on file  Social History Narrative   Lives at home his mother.   3-4 sodas per week.   Right-handed.   Social Determinants of Health   Financial Resource Strain: Not on file  Food Insecurity: Not on file  Transportation Needs: Not on file  Physical Activity: Not on file  Stress: Not on file  Social Connections: Not on file   Additional Social History:   Sleep: Fair  Appetite:  Fair  Current Medications: Current Facility-Administered Medications  Medication Dose Route Frequency Provider Last Rate Last Admin   acetaminophen (TYLENOL) tablet 650 mg  650 mg Oral Q6H PRN Ardis Hughs, NP       alum & mag hydroxide-simeth (MAALOX/MYLANTA) 200-200-20 MG/5ML suspension 30 mL  30 mL Oral Q4H PRN Ardis Hughs, NP       carvedilol (COREG) tablet 12.5 mg  12.5 mg Oral BID WC Courtlyn Aki, NP       diphenhydrAMINE (BENADRYL) capsule 50 mg  50 mg Oral TID PRN Ardis Hughs, NP   50 mg at 01/29/23 1434   Or   diphenhydrAMINE (BENADRYL) injection 50 mg  50 mg Intramuscular TID PRN Ardis Hughs, NP       divalproex (DEPAKOTE) DR tablet 500 mg  500 mg Oral BID Armandina Stammer I, NP   500 mg at 02/01/23 1610   Eslicarbazepine Acetate TABS 1,600 mg  1,600 mg Oral Daily Ardis Hughs, NP   1,600 mg at 02/01/23 0735   haloperidol (HALDOL) tablet 5 mg  5 mg Oral TID PRN Ardis Hughs,  NP   5 mg at 01/29/23 1433   Or   haloperidol lactate (HALDOL) injection 5 mg  5 mg Intramuscular TID PRN Ardis Hughs, NP       loratadine (CLARITIN) tablet 10 mg  10 mg Oral Daily Ardis Hughs, NP   10 mg at 02/01/23 0735   LORazepam (ATIVAN) tablet 2 mg  2 mg Oral TID PRN Ardis Hughs, NP   2 mg at 01/29/23 1433   Or   LORazepam (ATIVAN) injection 2 mg  2 mg Intramuscular TID PRN Ardis Hughs, NP       LORazepam (ATIVAN) injection  2 mg  2 mg Intramuscular Daily PRN [name], NP       magnesium hydroxide (MILK OF MAGNESIA) suspension 30 mL  30 mL Oral Daily PRN Ardis Hughs, NP       paliperidone (INVEGA SUSTENNA) injection 234 mg  234 mg Intramuscular Once Rex Kras, MD       paliperidone (INVEGA) 24 hr tablet 3 mg  3 mg Oral BID [name], NP   3 mg at 02/01/23 0738   traZODone (DESYREL) tablet 50 mg  50 mg Oral QHS PRN Ardis Hughs, NP   50 mg at 01/31/23 2028   zonisamide (ZONEGRAN) capsule 300 mg  300 mg Oral Daily Ardis Hughs, NP   300 mg at 02/01/23 1610    Lab Results:  Results for orders placed or performed during the hospital encounter of 01/27/23 (from the past 48 hour(s))  Valproic acid level     Status: Abnormal   Collection Time: 01/31/23  6:25 AM  Result Value Ref Range   Valproic Acid Lvl 103 (H) 50.0 - 100.0 ug/mL    Comment: Performed at [facility], [address], [place], [place] [address]    Blood Alcohol level:  Lab Results  Component Value Date   [facility] <10 01/26/2023   ETH <10 11/26/2022    Metabolic Disorder Labs: Lab Results  Component Value Date   HGBA1C 5.5 01/30/2023   MPG 111.15 01/30/2023   MPG 119.76 12/24/2019   Lab Results  Component Value Date   PROLACTIN 40.3 (H) 12/06/2022   Lab Results  Component Value Date   CHOL 151 01/30/2023   TRIG 93 01/30/2023   HDL 41 01/30/2023   CHOLHDL 3.7 01/30/2023   VLDL 19 01/30/2023   LDLCALC 91 01/30/2023   LDLCALC  105 (H) 01/03/2022    Physical Findings: AIMS:  , ,  ,  ,    CIWA:    COWS:     Musculoskeletal: Strength & Muscle Tone: within normal limits Gait & Station: normal Patient leans: N/A  Psychiatric Specialty Exam:  Presentation  General Appearance:  Appropriate for Environment; Fairly Groomed  Eye Contact: Fair  Speech: Clear and Coherent  Speech Volume: Normal  Handedness: Right   Mood and Affect  Mood: Depressed; Anxious  Affect: Congruent   Thought Process  Thought Processes: Coherent  Descriptions of Associations:Intact  Orientation:Full (Time, Place and Person)  Thought Content:Delusions (Delusions of grandiose)  History of Schizophrenia/Schizoaffective disorder:No  Duration of Psychotic Symptoms:Greater than six months  Hallucinations:Hallucinations: None   Ideas of Reference:None  Suicidal Thoughts:Suicidal Thoughts: No   Homicidal Thoughts:Homicidal Thoughts: No    Sensorium  Memory: Immediate Good  Judgment: Fair  Insight: Fair   Art therapist  Concentration: Fair  Attention Span: Fair  Recall: Fair  Fund of Knowledge: Poor  Language: Fair   Psychomotor Activity  Psychomotor Activity: Psychomotor Activity: Normal    Assets  Assets: Housing; Social Support   Sleep  Sleep: Sleep: Good   Physical Exam: Physical Exam Vitals and nursing note reviewed.  HENT:     Mouth/Throat:     Pharynx: Oropharynx is clear.  Cardiovascular:     Pulses: Normal pulses.     Comments: Elevated diastolic pressure (108/93).  Patient is on Coreg. See MAR. Patient is currently in no apparent distress.  Vital signs will be rechecked. Genitourinary:    Comments: Deferred Musculoskeletal:        General: Normal range of motion.  Cervical back: Normal range of motion.  Skin:    General: Skin is warm and dry.  Neurological:     General: No focal deficit present.     Mental Status: He is alert and  oriented to person, place, and time.    Review of Systems  Constitutional:  Negative for chills, diaphoresis and fever.  HENT:  Negative for congestion and sore throat.   Respiratory:  Negative for cough, shortness of breath and wheezing.   Cardiovascular:  Negative for chest pain and palpitations.  Gastrointestinal:  Negative for heartburn and nausea.  Genitourinary:  Negative for dysuria.  Musculoskeletal:  Negative for myalgias.  Neurological:  Negative for dizziness, tingling, tremors, sensory change, speech change, focal weakness, seizures, loss of consciousness, weakness and headaches.  Psychiatric/Behavioral:  Positive for depression and substance abuse. Negative for hallucinations, memory loss and suicidal ideas. The patient is nervous/anxious and has insomnia.    Blood pressure (!) 133/99, pulse 89, temperature 97.8 F (36.6 C), temperature source Oral, resp. rate 20, SpO2 100 %. There is no height or weight on file to calculate BMI.  Treatment Plan Summary: Daily contact with patient to assess and evaluate symptoms and progress in treatment and Medication management.   Continue inpatient hospitalization.  Will continue today 02/01/2023 plan as below except where it is noted.   Diagnoses:  Principal Problem:   Bipolar I disorder, severe, current or most recent episode manic, with psychotic features (HCC) Active Problems:   Seizures (HCC)   Delta-9-tetrahydrocannabinol (THC) dependence (HCC)   GAD (generalized anxiety disorder)   Insomnia   Medications -Give LAI Invega Sustenna 234 mg IM on 5/11 for mood stabilization -Previously discontinued Risperdal on admission -Continue Ativan 2 mg IM daily as needed for status epilepticus -Continue Invega 3 mg po BID for mood stabilization. -Continue Depakote 500 mg po bid for mood stabilization (Level on 5/10 103. Ammonia level on 5/8 WNL at 34)  -Increase Carvedilol from 6.25 mg to 12.5 mg for htn (home med) -Continue  Eslicarbazepine Acetate 1,600 mg daily (pt supplied med for seizures) -Continue Zonegran 300 mg po daily for seizures (home medication) -Continue trazodone 50 mg nightly as needed for sleep -Continue Claritin 10 mg daily for seasonal allergic rhinitis.  Agitation protocols: Cont as recommended;  -Benadryl 50 mg po or IM tid prn. -Haldol 5 mg po or IM tid prn.  -Lorazepam 2 mg po or IM tid prn.  Other PRNS -Continue Tylenol 650 mg every 6 hours PRN for mild pain -Continue Maalox 30 mg every 4 hrs PRN for indigestion -Continue Milk of Magnesia as needed every 6 hrs for constipation   Discharge Planning: Social work and case management to assist with discharge planning and identification of hospital follow-up needs prior to discharge Estimated LOS: 5-7 days Discharge Concerns: Need to establish a safety plan; Medication compliance and effectiveness Discharge Goals: Return home with outpatient referrals for mental health follow-up including medication management/psychotherapy  [name], NP, pmhnp. 02/01/2023, 3:32 PMPatient ID: Gabriel Carina, male

## 2023-02-01 NOTE — Progress Notes (Signed)
   02/01/23 0837  Psych Admission Type (Psych Patients Only)  Admission Status Involuntary  Psychosocial Assessment  Patient Complaints None  Eye Contact Fair  Facial Expression Animated  Affect Appropriate to circumstance  Speech Logical/coherent  Interaction Assertive  Motor Activity Restless  Appearance/Hygiene Unremarkable  Behavior Characteristics Appropriate to situation;Cooperative  Mood Pleasant;Euthymic  Thought Process  Coherency Circumstantial  Content Preoccupation  Delusions Grandeur  Perception Derealization  Hallucination None reported or observed  Judgment Poor  Confusion None  Danger to Self  Current suicidal ideation? Denies  Agreement Not to Harm Self Yes  Description of Agreement verbal  Danger to Others  Danger to Others None reported or observed  Danger to Others Abnormal  Harmful Behavior to others No threats or harm toward other people

## 2023-02-01 NOTE — Progress Notes (Signed)
Patient has been isolative to his room more tonight. 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 He was compliant with his medications and appeared less restless and much calmer tonight. Support given and safety maintained with 15 min checks.  02/01/23 2040  Psych Admission Type (Psych Patients Only)  Admission Status Involuntary  Psychosocial Assessment  Patient Complaints None  Eye Contact Fair  Facial Expression Animated  Affect Appropriate to circumstance  Speech Logical/coherent  Interaction Assertive  Motor Activity Other (Comment) (wnl)  Appearance/Hygiene Improved  Behavior Characteristics Cooperative;Appropriate to situation  Mood Pleasant  Thought Process  Coherency Circumstantial  Content Preoccupation  Delusions Grandeur  Perception Derealization  Hallucination None reported or observed  Judgment Poor  Confusion None  Danger to Self  Current suicidal ideation? Denies

## 2023-02-01 NOTE — Progress Notes (Signed)
Adult Psychoeducational Group Note  Date:  02/01/2023 Time:  8:07 AM  Group Topic/Focus:  Wrap-Up Group:   The focus of this group is to help patients review their daily goal of treatment and discuss progress on daily workbooks.  Participation Level:  Active  Participation Quality:  Appropriate  Affect:  Appropriate  Cognitive:  Appropriate  Insight: Appropriate  Engagement in Group:  Engaged  Modes of Intervention:  Discussion  Additional Comments:  The  patient attended group  Dylan Moon 02/01/2023, 8:07 AM

## 2023-02-01 NOTE — Progress Notes (Signed)
Adult Psychoeducational Group Note  Date:  02/01/2023 Time:  11:17 AM  Group Topic/Focus:  Goals Group:   The focus of this group is to help patients establish daily goals to achieve during treatment and discuss how the patient can incorporate goal setting into their daily lives to aide in recovery.  Participation Level:  Active  Participation Quality:  Appropriate  Affect:  Appropriate  Cognitive:  Appropriate  Insight: Appropriate  Engagement in Group:  Engaged  Modes of Intervention:  Discussion  Additional Comments  The patient engaged in group  Dylan Moon 02/01/2023, 11:17 AM

## 2023-02-01 NOTE — Progress Notes (Signed)
   02/01/23 0615  15 Minute Checks  Location Bedroom  Visual Appearance Calm  Behavior Composed  Sleep (Behavioral Health Patients Only)  Calculate sleep? (Click Yes once per 24 hr at 0600 safety check) Yes  Documented sleep last 24 hours 8

## 2023-02-02 LAB — HEPATIC FUNCTION PANEL
ALT: 20 U/L (ref 0–44)
AST: 17 U/L (ref 15–41)
Albumin: 4.5 g/dL (ref 3.5–5.0)
Alkaline Phosphatase: 45 U/L (ref 38–126)
Bilirubin, Direct: <0.1 mg/dL (ref 0.0–0.2)
Total Bilirubin: 0.5 mg/dL (ref 0.3–1.2)
Total Protein: 7.4 g/dL (ref 6.5–8.1)

## 2023-02-02 LAB — AMMONIA: Ammonia: 29 umol/L (ref 9–35)

## 2023-02-02 NOTE — Progress Notes (Signed)
[facility] MD Progress Note  02/02/2023 4:31 PM Dylan Moon  MRN:  [id number]  Reason for admission:Reason For Admission: Dylan Moon is a 27 yo AAM with past mental health diagnoses of ADHD, PTSD, bipolar d/o, GAD & a medical diagnosis of seizure d/o who presented to the [facility] health urgent care with law enforcement accompanied by his mother on 05/03 after she involuntarily committed him for aggressive behaviors & paranoia; As her documentation from the [facility], pt broke a porcelain toilet at home with his bare hands, thought someone was trying to set him up, and thought that there were fake people on his social media account. [name] mother reported that aggression started his most recent seizure activity on 05/03, and as per chart review, he was seen and treated in the ER for seizures on 05/03. For  this admission, pt was transferred to this [facility] for treatment and stabilization of his mental status.   24 hr chart review: Sleep Hours last night: 8 hrs as per nursing, documentation Nursing Concerns: none documented  Behavioral episodes in the past 24 NWG:NFAO  Medication Compliance:Compliant  Vital Signs in the past 24 hrs: Persistently elevated BP with SBP in the 140s, since admission. PRN Medications in the past 24 hrs: Trazodone 50 mg last night for sleep   Patient assessment note: During encounter today, delusions of grandiose have lessened. 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 He is continuing to be more logical in his response to questions, he is coherent, and is denying SI/HI/AVH, denies paranoia, and there is no evidence of delusional thinking.  Patient reports a good sleep quality last night, reports a good appetite, denies medication related side effects.  He denies being in any physical pain today.  Reports that he had a bowel movement today, and denies any other concerns.  As noted yesterday, pt's mother had wanted  for him to be off Depakote at admission due to medication previously causing elevated ammonia levels. This medication was continued at a lower dose of 250 mg BID on admission. Due to pt's h/o seizures, we did not wanting to take pt off the medication too fast due to a risk of seizures. Over the past couple of days, this medication has been increased by the providers that have been taking care of pt in that time frame, and pt seems to have benefited from this increase, as mood stability has improved.  Medication is currently at 500 mg BID, and last Valproic acid level on 5/10 was 103. Ammonia level 3 days ago was WNL at 34.  Ammonia level repeated today was within normal limits at 29.  LFTs remain within normal limits.  We will therefore continue Depakote at discharge, and outpatient provider will need to determine continuity of this medication based on patient's symptoms.  Pt and mother were interested in LAI on admission. Invega 234 mg IM was  administed on 5/11. Projected discharge date is Tuesday, 5/14 as long as there are plans in place for outpatient f/u. Coreg increased from 6.25 to 12.5 for management of htn on 5/11. SBP continues to run in the 140s. We will continue medications as listed below and will continue to monitor.  Principal Problem: Bipolar I disorder, severe, current or most recent episode manic, with psychotic features (HCC)  Diagnosis: Principal Problem:   Bipolar I disorder, severe, current or most recent episode manic, with psychotic features (HCC) Active Problems:  Seizures (HCC)   Delta-9-tetrahydrocannabinol (THC) dependence (HCC)   GAD (generalized anxiety disorder)   Insomnia  Total Time spent with patient:  35 minutes  Past Psychiatric History: See H&P.  Past Medical History:  Past Medical History:  Diagnosis Date   ADHD    Bipolar 1 disorder (HCC)    Schizoaffective disorder (HCC)    Scoliosis    Seizures (HCC)    most recent 12/02/17    Past Surgical  History:  Procedure Laterality Date   NO PAST SURGERIES     Family History:  Family History  Problem Relation Age of Onset   Diabetes Mother    Hypertension Mother    Cancer Other    Diabetes Father    Seizures Maternal Grandfather    Family Psychiatric  History: See H&P.  Social History:  Social History   Substance and Sexual Activity  Alcohol Use Not Currently     Social History   Substance and Sexual Activity  Drug Use Yes   Types: Marijuana   Comment: Daily use of marijuana.    Social History   Socioeconomic History   Marital status: Single    Spouse name: Not on file   Number of children: 0   Years of education: HS   Highest education level: Not on file  Occupational History   Occupation: Product manager of videos  Tobacco Use   Smoking status: Every Day    Types: Cigars   Smokeless tobacco: Never  Vaping Use   Vaping Use: Never used  Substance and Sexual Activity   Alcohol use: Not Currently   Drug use: Yes    Types: Marijuana    Comment: Daily use of marijuana.   Sexual activity: Not Currently  Other Topics Concern   Not on file  Social History Narrative   Lives at home his mother.   3-4 sodas per week.   Right-handed.   Social Determinants of Health   Financial Resource Strain: Not on file  Food Insecurity: Not on file  Transportation Needs: Not on file  Physical Activity: Not on file  Stress: Not on file  Social Connections: Not on file   Additional Social History:   Sleep: Fair  Appetite:  Fair  Current Medications: Current Facility-Administered Medications  Medication Dose Route Frequency Provider Last Rate Last Admin   acetaminophen (TYLENOL) tablet 650 mg  650 mg Oral Q6H PRN Ardis Hughs, NP       alum & mag hydroxide-simeth (MAALOX/MYLANTA) 200-200-20 MG/5ML suspension 30 mL  30 mL Oral Q4H PRN Ardis Hughs, NP       carvedilol (COREG) tablet 12.5 mg  12.5 mg Oral BID WC Reynalda Canny, NP   12.5 mg at  02/02/23 0800   diphenhydrAMINE (BENADRYL) capsule 50 mg  50 mg Oral TID PRN Ardis Hughs, NP   50 mg at 01/29/23 1434   Or   diphenhydrAMINE (BENADRYL) injection 50 mg  50 mg Intramuscular TID PRN Ardis Hughs, NP       divalproex (DEPAKOTE) DR tablet 500 mg  500 mg Oral BID Armandina Stammer I, NP   500 mg at 02/02/23 1112   Eslicarbazepine Acetate TABS 1,600 mg  1,600 mg Oral Daily Ardis Hughs, NP   1,600 mg at 02/02/23 0758   haloperidol (HALDOL) tablet 5 mg  5 mg Oral TID PRN Ardis Hughs, NP   5 mg at 01/29/23 1433   Or   haloperidol lactate (HALDOL) injection 5 mg  5 mg Intramuscular TID PRN Ardis Hughs, NP       loratadine (CLARITIN) tablet 10 mg  10 mg Oral Daily Ardis Hughs, NP   10 mg at 02/02/23 0800   LORazepam (ATIVAN) tablet 2 mg  2 mg Oral TID PRN Ardis Hughs, NP   2 mg at 01/29/23 1433   Or   LORazepam (ATIVAN) injection 2 mg  2 mg Intramuscular TID PRN Ardis Hughs, NP       LORazepam (ATIVAN) injection 2 mg  2 mg Intramuscular Daily PRN [name], NP       magnesium hydroxide (MILK OF MAGNESIA) suspension 30 mL  30 mL Oral Daily PRN Ardis Hughs, NP       paliperidone (INVEGA) 24 hr tablet 3 mg  3 mg Oral BID [name], NP   3 mg at 02/02/23 0800   traZODone (DESYREL) tablet 50 mg  50 mg Oral QHS PRN Ardis Hughs, NP   50 mg at 02/01/23 2035   zonisamide (ZONEGRAN) capsule 300 mg  300 mg Oral Daily Ardis Hughs, NP   300 mg at 02/02/23 1610    Lab Results:  Results for orders placed or performed during the hospital encounter of 01/27/23 (from the past 48 hour(s))  Ammonia     Status: None   Collection Time: 02/02/23  6:20 AM  Result Value Ref Range   Ammonia 29 9 - 35 umol/L    Comment: Performed at [facility], [address] [place], [place] [address]  Hepatic function panel     Status: None   Collection Time: 02/02/23  6:20 AM  Result Value Ref Range   Total Protein 7.4  6.5 - 8.1 g/dL   Albumin 4.5 3.5 - 5.0 g/dL   AST 17 15 - 41 U/L   ALT 20 0 - 44 U/L   Alkaline Phosphatase 45 38 - 126 U/L   Total Bilirubin 0.5 0.3 - 1.2 mg/dL   Bilirubin, Direct <4.0 0.0 - 0.2 mg/dL   Indirect Bilirubin NOT CALCULATED 0.3 - 0.9 mg/dL    Comment: Performed at [facility], [address] [place], [place] [address]    Blood Alcohol level:  Lab Results  Component Value Date   [facility] <10 01/26/2023   ETH <10 11/26/2022    Metabolic Disorder Labs: Lab Results  Component Value Date   HGBA1C 5.5 01/30/2023   MPG 111.15 01/30/2023   MPG 119.76 12/24/2019   Lab Results  Component Value Date   PROLACTIN 40.3 (H) 12/06/2022   Lab Results  Component Value Date   CHOL 151 01/30/2023   TRIG 93 01/30/2023   HDL 41 01/30/2023   CHOLHDL 3.7 01/30/2023   VLDL 19 01/30/2023   LDLCALC 91 01/30/2023   LDLCALC 105 (H) 01/03/2022    Physical Findings: AIMS:  , ,  ,  ,    CIWA:    COWS:     Musculoskeletal: Strength & Muscle Tone: within normal limits Gait & Station: normal Patient leans: N/A  Psychiatric Specialty Exam:  Presentation  General Appearance:  Appropriate for Environment; Fairly Groomed  Eye Contact: Good  Speech: Clear and Coherent  Speech Volume: Normal  Handedness: Right   Mood and Affect  Mood: Euthymic  Affect: Appropriate; Congruent   Thought Process  Thought Processes: Coherent  Descriptions of Associations:Intact  Orientation:Full (Time, Place and Person)  Thought Content:Logical  History of Schizophrenia/Schizoaffective disorder:Yes  Duration of Psychotic  Symptoms:Greater than six months  Hallucinations:Hallucinations: None   Ideas of Reference:None  Suicidal Thoughts:Suicidal Thoughts: No   Homicidal Thoughts:Homicidal Thoughts: No    Sensorium  Memory: Immediate Good  Judgment: Fair  Insight: Fair   Executive Functions  Concentration: Good  Attention  Span: Good  Recall: Fair  Fund of Knowledge: Fair  Language: Fair   Psychomotor Activity  Psychomotor Activity: Psychomotor Activity: Normal    Assets  Assets: Social Support; Resilience; Communication Skills   Sleep  Sleep: Sleep: Good   Physical Exam: Physical Exam Vitals and nursing note reviewed.  HENT:     Mouth/Throat:     Pharynx: Oropharynx is clear.  Cardiovascular:     Pulses: Normal pulses.     Comments: Elevated diastolic pressure (108/93).  Patient is on Coreg. See MAR. Patient is currently in no apparent distress.  Vital signs will be rechecked. Genitourinary:    Comments: Deferred Musculoskeletal:        General: Normal range of motion.     Cervical back: Normal range of motion.  Skin:    General: Skin is warm and dry.  Neurological:     General: No focal deficit present.     Mental Status: He is alert and oriented to person, place, and time.    Review of Systems  Constitutional:  Negative for chills, diaphoresis and fever.  HENT:  Negative for congestion and sore throat.   Respiratory:  Negative for cough, shortness of breath and wheezing.   Cardiovascular:  Negative for chest pain and palpitations.  Gastrointestinal:  Negative for heartburn and nausea.  Genitourinary:  Negative for dysuria.  Musculoskeletal:  Negative for myalgias.  Neurological:  Negative for dizziness, tingling, tremors, sensory change, speech change, focal weakness, seizures, loss of consciousness, weakness and headaches.  Psychiatric/Behavioral:  Positive for depression and substance abuse. Negative for hallucinations, memory loss and suicidal ideas. The patient is nervous/anxious and has insomnia.    Blood pressure 136/80, pulse 74, temperature 98.5 F (36.9 C), temperature source Oral, resp. rate 20, SpO2 100 %. There is no height or weight on file to calculate BMI.  Treatment Plan Summary: Daily contact with patient to assess and evaluate symptoms and  progress in treatment and Medication management.   Continue inpatient hospitalization.  Will continue today 02/02/2023 plan as below except where it is noted.   Diagnoses:  Principal Problem:   Bipolar I disorder, severe, current or most recent episode manic, with psychotic features (HCC) Active Problems:   Seizures (HCC)   Delta-9-tetrahydrocannabinol (THC) dependence (HCC)   GAD (generalized anxiety disorder)   Insomnia   Medications -Given LAI Invega Sustenna 234 mg IM on 5/11 for mood stabilization -Previously discontinued Risperdal on admission -Continue Ativan 2 mg IM daily as needed for status epilepticus -Continue Invega 3 mg po BID for mood stabilization. -Continue Depakote 500 mg po bid for mood stabilization (Level on 5/10 103. Ammonia level on 5/8 WNL at 34) -Continue Carvedilol 12.5 mg for htn (home med) -Continue Eslicarbazepine Acetate 1,600 mg daily (pt supplied med for seizures) -Continue Zonegran 300 mg po daily for seizures (home medication) -Continue trazodone 50 mg nightly as needed for sleep -Continue Claritin 10 mg daily for seasonal allergic rhinitis.  Agitation protocols: Cont as recommended;  -Benadryl 50 mg po or IM tid prn. -Haldol 5 mg po or IM tid prn.  -Lorazepam 2 mg po or IM tid prn.  Other PRNS -Continue Tylenol 650 mg every 6 hours PRN for mild pain -Continue Maalox  30 mg every 4 hrs PRN for indigestion -Continue Milk of Magnesia as needed every 6 hrs for constipation   Discharge Planning: Social work and case management to assist with discharge planning and identification of hospital follow-up needs prior to discharge Estimated LOS: 5-7 days Discharge Concerns: Need to establish a safety plan; Medication compliance and effectiveness Discharge Goals: Return home with outpatient referrals for mental health follow-up including medication management/psychotherapy  [name], NP, pmhnp. 02/02/2023, 4:31 PMPatient ID: Dylan Moon, male   Patient ID: Dylan Moon, male   DOB: May 26, 1996, 27 y.o.   MRN: [id number]

## 2023-02-02 NOTE — Progress Notes (Signed)
   02/02/23 0547  15 Minute Checks  Location Bedroom  Visual Appearance Calm  Behavior Sleeping  Sleep (Behavioral Health Patients Only)  Calculate sleep? (Click Yes once per 24 hr at 0600 safety check) Yes  Documented sleep last 24 hours 9

## 2023-02-02 NOTE — Progress Notes (Signed)
Patient is looking forward to discharge on Tuesday. He reports having had a good day. He had a good visit with his girlfriend this evening. He was compliant with his medications. He has been in his room most of the evening resting after taking his medications.   02/02/23 2157  Psych Admission Type (Psych Patients Only)  Admission Status Involuntary  Psychosocial Assessment  Patient Complaints None  Eye Contact Fair  Facial Expression Animated  Affect Appropriate to circumstance  Speech Logical/coherent  Interaction Assertive  Motor Activity Other (Comment) (wnl)  Appearance/Hygiene Improved  Behavior Characteristics Cooperative  Mood Pleasant  Thought Process  Coherency Circumstantial  Content WDL  Delusions None reported or observed  Perception WDL  Hallucination None reported or observed  Judgment Poor  Confusion None  Danger to Self  Current suicidal ideation? Denies

## 2023-02-03 ENCOUNTER — Encounter (HOSPITAL_COMMUNITY): Payer: Self-pay

## 2023-02-03 MED ORDER — CARVEDILOL 25 MG PO TABS
25.0000 mg | ORAL_TABLET | Freq: Two times a day (BID) | ORAL | Status: DC
  Administered 2023-02-03 – 2023-02-04 (×2): 25 mg via ORAL
  Filled 2023-02-03 (×6): qty 1

## 2023-02-03 NOTE — Group Note (Signed)
Date:  02/03/2023 Time:  9:54 AM  Group Topic/Focus:  Goals Group:   The focus of this group is to help patients establish daily goals to achieve during treatment and discuss how the patient can incorporate goal setting into their daily lives to aide in recovery.    Participation Level:  Active  Participation Quality:  Appropriate  Affect:  Appropriate  Cognitive:  Appropriate  Insight: Appropriate  Engagement in Group:  Engaged  Modes of Intervention:  Discussion  Additional Comments:  Goal: Make everybody smile.  Donell Beers 02/03/2023, 9:54 AM

## 2023-02-03 NOTE — Group Note (Unsigned)
Date:  02/03/2023 Time:  9:34 PM  Group Topic/Focus:  Wrap-Up Group:   The focus of this group is to help patients review their daily goal of treatment and discuss progress on daily workbooks.     Participation Level:  {BHH PARTICIPATION LEVEL:22264}  Participation Quality:  {BHH PARTICIPATION QUALITY:22265}  Affect:  {BHH AFFECT:22266}  Cognitive:  {BHH COGNITIVE:22267}  Insight: {BHH Insight2:20797}  Engagement in Group:  {BHH ENGAGEMENT IN GROUP:22268}  Modes of Intervention:  {BHH MODES OF INTERVENTION:22269}  Additional Comments:  ***  Swathi Dauphin Dacosta 02/03/2023, 9:34 PM  

## 2023-02-03 NOTE — Group Note (Signed)
Recreation Therapy Group Note   Group Topic:Coping Skills  Group Date: 02/03/2023 Start Time: 1010 End Time: 1045 Facilitators: Caylyn Tedeschi-McCall, LRT,CTRS Location: [address] Dayroom   Goal Area(s) Addresses: Patient will define what a coping skill is. Patient will create a list of healthy coping skills beginning with each letter of the alphabet. Patient will successfully identify positive coping skills they can use post d/c.  Patient will acknowledge benefit(s) of using learned coping skills post d/c.  Group Description:  Coping A to Z. Patient asked to identify what a coping skill is and when they use them. Patients with Clinical research associate discussed healthy versus unhealthy coping skills. Next patients were given a blank worksheet titled "Coping Skills A-Z".  Patients were instructed to come up with at least one positive coping skill per letter of the alphabet.  Patients were given 15 minutes to brainstorm before ideas were presented. Patients and LRT debriefed on the importance of coping skill selection based on situation and back-up plans when a skill tried is not effective.    Affect/Mood: Appropriate   Participation Level: Engaged   Participation Quality: Independent   Behavior: Appropriate   Speech/Thought Process: Focused   Insight: Good   Judgement: Good   Modes of Intervention: Worksheet   Patient Response to Interventions:  Engaged   Education Outcome:  Acknowledges education   Clinical Observations/Individualized Feedback: Pt was engaged and appropriate throughout group session.  Pt was appropriate and able to focus for the majority of group.  Pt was unable to identify coping skills for each letter of the alphabet but he was able to come up with some.  Pt identified his six top coping skills as basketball, drinking water, photography, idiography, eating food, make videos, attentive and YouTube videos.  Pt was being egged on by peer for still being here.  Pt expressed he  didn't want to do what he did at his last placement.  Despite saying this, pt was able to control his feelings and not allow himself to get amp'd up and remain level headed/calm.        Plan: Continue to engage patient in RT group sessions 2-3x/week.   Erasmus Bistline-McCall, LRT,CTRS 02/03/2023 12:30 PM

## 2023-02-03 NOTE — Progress Notes (Signed)
DAR NOTE: Patient presents with a calm affect and mood.  Denies suicidal thoughts, pain, auditory and visual hallucinations.  Described energy level as normal with good concentration.  No seizure activity reported or observed.  Rates depression at 0, hopelessness at 0, and anxiety at 3.  Maintained on routine safety checks.  Medications given as prescribed.  Support and encouragement offered as needed.  Attended group and participated.  States goal for today is "to keep a smile on everyone's face."  Patient observed socializing with peers in the dayroom.  Offered no complaint.  Patient is safe on and off the unit.

## 2023-02-03 NOTE — BH IP Treatment Plan (Unsigned)
Interdisciplinary Treatment and Diagnostic Plan Update  02/03/2023 Time of Session: 8:30am, update Dylan Moon MRN: [id number]  Principal Diagnosis: Bipolar I disorder, severe, current or most recent episode manic, with psychotic features (HCC)  Secondary Diagnoses: Principal Problem:   Bipolar I disorder, severe, current or most recent episode manic, with psychotic features (HCC) Active Problems:   Seizures (HCC)   Delta-9-tetrahydrocannabinol (THC) dependence (HCC)   GAD (generalized anxiety disorder)   Insomnia   Current Medications:  Current Facility-Administered Medications  Medication Dose Route Frequency Provider Last Rate Last Admin   acetaminophen (TYLENOL) tablet 650 mg  650 mg Oral Q6H PRN Ardis Hughs, NP       alum & mag hydroxide-simeth (MAALOX/MYLANTA) 200-200-20 MG/5ML suspension 30 mL  30 mL Oral Q4H PRN Ardis Hughs, NP       carvedilol (COREG) tablet 12.5 mg  12.5 mg Oral BID WC Nkwenti, Doris, NP   12.5 mg at 02/03/23 0843   diphenhydrAMINE (BENADRYL) capsule 50 mg  50 mg Oral TID PRN Ardis Hughs, NP   50 mg at 01/29/23 1434   Or   diphenhydrAMINE (BENADRYL) injection 50 mg  50 mg Intramuscular TID PRN Ardis Hughs, NP       divalproex (DEPAKOTE) DR tablet 500 mg  500 mg Oral BID Armandina Stammer I, NP   500 mg at 02/02/23 2035   Eslicarbazepine Acetate TABS 1,600 mg  1,600 mg Oral Daily Ardis Hughs, NP   1,600 mg at 02/03/23 0843   haloperidol (HALDOL) tablet 5 mg  5 mg Oral TID PRN Ardis Hughs, NP   5 mg at 01/29/23 1433   Or   haloperidol lactate (HALDOL) injection 5 mg  5 mg Intramuscular TID PRN Ardis Hughs, NP       loratadine (CLARITIN) tablet 10 mg  10 mg Oral Daily Ardis Hughs, NP   10 mg at 02/03/23 9562   LORazepam (ATIVAN) tablet 2 mg  2 mg Oral TID PRN Ardis Hughs, NP   2 mg at 01/29/23 1433   Or   LORazepam (ATIVAN) injection 2 mg  2 mg Intramuscular TID PRN Ardis Hughs, NP        LORazepam (ATIVAN) injection 2 mg  2 mg Intramuscular Daily PRN [name], NP       magnesium hydroxide (MILK OF MAGNESIA) suspension 30 mL  30 mL Oral Daily PRN Ardis Hughs, NP       paliperidone (INVEGA) 24 hr tablet 3 mg  3 mg Oral BID [name], NP   3 mg at 02/03/23 0842   traZODone (DESYREL) tablet 50 mg  50 mg Oral QHS PRN Ardis Hughs, NP   50 mg at 02/02/23 2035   zonisamide (ZONEGRAN) capsule 300 mg  300 mg Oral Daily Ardis Hughs, NP   300 mg at 02/03/23 1308   PTA Medications: Medications Prior to Admission  Medication Sig Dispense Refill Last Dose   carvedilol (COREG) 6.25 MG tablet Take 1 tablet (6.25 mg total) by mouth 2 (two) times daily with a meal.      cetirizine (ZYRTEC) 10 MG tablet Take 10 mg by mouth daily as needed for allergies.      clonazePAM (KLONOPIN) 0.5 MG disintegrating tablet Take 1 tablet (0.5 mg total) by mouth 3 (three) times daily as needed (agitation or seizure like activity). 60 tablet 0    cloNIDine (CATAPRES) 0.1 MG tablet Take 0.1 mg by mouth daily  as needed (For anxiety.).      divalproex (DEPAKOTE) 250 MG DR tablet Take 1 tablet (250 mg total) by mouth 2 (two) times daily.      Eslicarbazepine Acetate (APTIOM) 800 MG TABS Take 1,600 mg by mouth daily. 30 tablet     risperiDONE (RISPERDAL) 2 MG tablet Take 1 tablet (2 mg total) by mouth 2 (two) times daily.      traZODone (DESYREL) 50 MG tablet Take 1 tablet (50 mg total) by mouth at bedtime as needed for sleep.      Vitamin D, Ergocalciferol, (DRISDOL) 1.25 MG (50000 UNIT) CAPS capsule Take 50,000 Units by mouth every Sunday.      zonisamide (ZONEGRAN) 100 MG capsule Take 3 capsules (300 mg total) by mouth daily.       Patient Stressors: Marital or family conflict   Medication change or noncompliance   Substance abuse    Patient Strengths: Automotive engineer for treatment/growth  Supportive family/friends   Treatment Modalities: Medication  Management, Group therapy, Case management,  1 to 1 session with clinician, Psychoeducation, Recreational therapy.   Physician Treatment Plan for Primary Diagnosis: Bipolar I disorder, severe, current or most recent episode manic, with psychotic features (HCC) Long Term Goal(s): Improvement in symptoms so as ready for discharge   Short Term Goals: Ability to identify changes in lifestyle to reduce recurrence of condition will improve Ability to verbalize feelings will improve Ability to demonstrate self-control will improve Ability to identify and develop effective coping behaviors will improve Ability to maintain clinical measurements within normal limits will improve Compliance with prescribed medications will improve Ability to identify triggers associated with substance abuse/mental health issues will improve  Medication Management: Evaluate patient's response, side effects, and tolerance of medication regimen.  Therapeutic Interventions: 1 to 1 sessions, Unit Group sessions and Medication administration.  Evaluation of Outcomes: Progressing  Physician Treatment Plan for Secondary Diagnosis: Principal Problem:   Bipolar I disorder, severe, current or most recent episode manic, with psychotic features (HCC) Active Problems:   Seizures (HCC)   Delta-9-tetrahydrocannabinol (THC) dependence (HCC)   GAD (generalized anxiety disorder)   Insomnia  Long Term Goal(s): Improvement in symptoms so as ready for discharge   Short Term Goals: Ability to identify changes in lifestyle to reduce recurrence of condition will improve Ability to verbalize feelings will improve Ability to demonstrate self-control will improve Ability to identify and develop effective coping behaviors will improve Ability to maintain clinical measurements within normal limits will improve Compliance with prescribed medications will improve Ability to identify triggers associated with substance abuse/mental health  issues will improve     Medication Management: Evaluate patient's response, side effects, and tolerance of medication regimen.  Therapeutic Interventions: 1 to 1 sessions, Unit Group sessions and Medication administration.  Evaluation of Outcomes: Progressing   RN Treatment Plan for Primary Diagnosis: Bipolar I disorder, severe, current or most recent episode manic, with psychotic features (HCC) Long Term Goal(s): Knowledge of disease and therapeutic regimen to maintain health will improve  Short Term Goals: Ability to remain free from injury will improve, Ability to verbalize frustration and anger appropriately will improve, Ability to demonstrate self-control, Ability to participate in decision making will improve, Ability to verbalize feelings will improve, Ability to disclose and discuss suicidal ideas, Ability to identify and develop effective coping behaviors will improve, and Compliance with prescribed medications will improve  Medication Management: RN will administer medications as ordered by provider, will assess and evaluate patient's response  and provide education to patient for prescribed medication. RN will report any adverse and/or side effects to prescribing provider.  Therapeutic Interventions: 1 on 1 counseling sessions, Psychoeducation, Medication administration, Evaluate responses to treatment, Monitor vital signs and CBGs as ordered, Perform/monitor CIWA, COWS, AIMS and Fall Risk screenings as ordered, Perform wound care treatments as ordered.  Evaluation of Outcomes: Progressing   LCSW Treatment Plan for Primary Diagnosis: Bipolar I disorder, severe, current or most recent episode manic, with psychotic features (HCC) Long Term Goal(s): Safe transition to appropriate next level of care at discharge, Engage patient in therapeutic group addressing interpersonal concerns.  Short Term Goals: Engage patient in aftercare planning with referrals and resources, Increase social  support, Increase ability to appropriately verbalize feelings, Increase emotional regulation, Facilitate acceptance of mental health diagnosis and concerns, Facilitate patient progression through stages of change regarding substance use diagnoses and concerns, Identify triggers associated with mental health/substance abuse issues, and Increase skills for wellness and recovery  Therapeutic Interventions: Assess for all discharge needs, 1 to 1 time with Social worker, Explore available resources and support systems, Assess for adequacy in community support network, Educate family and significant other(s) on suicide prevention, Complete Psychosocial Assessment, Interpersonal group therapy.  Evaluation of Outcomes: Progressing   Progress in Treatment: Attending groups: Yes. Participating in groups: Yes. Taking medication as prescribed: Yes. Toleration medication: Yes. Family/Significant other contact made: Yes, individual(s) contacted:  [name] (Mother) [phone] Patient understands diagnosis: No. Discussing patient identified problems/goals with staff: Yes. Medical problems stabilized or resolved: Yes. Denies suicidal/homicidal ideation: Yes. Issues/concerns per patient self-inventory: No.   New problem(s) identified: No, Describe:  none reported   New Short Term/Long Term Goal(s):   medication stabilization, elimination of SI thoughts, development of comprehensive mental wellness plan.    Patient Goals:  Pt states, "Take my medications"  Discharge Plan or Barriers: Pt will be connected with outpatient med management and therapy.  Reason for Continuation of Hospitalization: Anxiety Depression Mania Medication stabilization  Estimated Length of Stay: 1-3 days  Last 3 [place] Suicide Severity Risk Score: Flowsheet Row [facility] (Current[facility] Most recent reading at 01/27/2023 10:15 PM ED from 01/26/2023 in [facility] Most recent reading at 01/27/2023  9:54 AM ED from 01/26/2023 in [facility] Most recent reading at 01/26/2023 11:31 AM  C-SSRS RISK CATEGORY No Risk No Risk No Risk       Last PHQ 2/9 Scores:    11/13/2021   10:45 AM 06/01/2021   11:43 AM 04/13/2021   12:26 PM  Depression screen PHQ 2/9  Decreased Interest 1 0 0  Down, Depressed, Hopeless 0 0 0  PHQ - 2 Score 1 0 0    Scribe for Treatment Team: Beatris Si, LCSW 02/03/2023 9:40 AM

## 2023-02-03 NOTE — Progress Notes (Signed)
   02/03/23 0546  15 Minute Checks  Location Bedroom  Visual Appearance Calm  Behavior Composed  Sleep (Behavioral Health Patients Only)  Calculate sleep? (Click Yes once per 24 hr at 0600 safety check) Yes  Documented sleep last 24 hours 8.75

## 2023-02-03 NOTE — Progress Notes (Signed)
[facility] MD Progress Note  02/03/2023 3:19 PM Dylan Moon  MRN:  [id number]  Reason for admission:Reason For Admission: Dylan Moon is a 26 yo AAM with past mental health diagnoses of ADHD, PTSD, bipolar d/o, GAD & a medical diagnosis of seizure d/o who presented to the [facility] health urgent care with law enforcement accompanied by his mother on 05/03 after she involuntarily committed him for aggressive behaviors & paranoia; As her documentation from the [facility], pt broke a porcelain toilet at home with his bare hands, thought someone was trying to set him up, and thought that there were fake people on his social media account. [name] mother reported that aggression started his most recent seizure activity on 05/03, and as per chart review, he was seen and treated in the ER for seizures on 05/03. For  this admission, pt was transferred to this [facility] for treatment and stabilization of his mental status.   24 hr chart review: Sleep Hours last night: 8.75 hrs as per nursing, documentation Nursing Concerns: none documented  Behavioral episodes in the past 24 WUJ:WJXB  Medication Compliance: Compliant  Vital Signs in the past 24 hrs: Persistently elevated BP with SBP in the 140s, since admission.  PRN Medications in the past 24 hrs: Trazodone 50 mg last night for sleep  Patient assessment note: During encounter today, delusions of grandiose have lessened, and seem to be pt's baseline at this point. He is rationale in responses to questions, answers questions linearly, is coherent, is calm and cooperative with care. He is continuing to deny SI/HI/AVH, denies paranoia.  Patient reports a good sleep quality last night, and states that "Moon slept like a baby". He reports a good appetite, denies medication related side effects.  He denies being in any physical pain today.  Denies any other concerns and verbalizes readiness for discharge, and verbally contracts for safety outside of [facility].  [name] discharge date is for tomorrow, 5/14 as long as he has outpatient f/u appointments and if safety planning has been completed by CSW with his mother or GF.    SBP continues to run in the 140s. We increased Coreg from 6.25 mg to 12.5 mg BID 2 days ago, but BP continues to trend upwards. We will increase Coreg further to 25 mg BID starting with tonight's dose. will continue other medications as listed below and will need PCP f/u after discharge.  Principal Problem: Bipolar Moon disorder, severe, current or most recent episode manic, with psychotic features (HCC)  Diagnosis: Principal Problem:   Bipolar Moon disorder, severe, current or most recent episode manic, with psychotic features (HCC) Active Problems:   Seizures (HCC)   Delta-9-tetrahydrocannabinol (THC) dependence (HCC)   GAD (generalized anxiety disorder)   Insomnia  Total Time spent with patient:  35 minutes  Past Psychiatric History: See H&P.  Past Medical History:  Past Medical History:  Diagnosis Date   ADHD    Bipolar 1 disorder (HCC)    Schizoaffective disorder (HCC)    Scoliosis    Seizures (HCC)    most recent 12/02/17    Past Surgical History:  Procedure Laterality Date   NO PAST SURGERIES     Family History:  Family History  Problem Relation Age of Onset   Diabetes Mother    Hypertension Mother    Cancer Other    Diabetes Father    Seizures Maternal Grandfather    Family Psychiatric  History: See H&P.  Social History:  Social History  Substance and Sexual Activity  Alcohol Use Not Currently     Social History   Substance and Sexual Activity  Drug Use Yes   Types: Marijuana   Comment: Daily use of marijuana.    Social History   Socioeconomic History   Marital status: Single    Spouse name: Not on file   Number of children: 0   Years of education: HS   Highest education level: Not on file  Occupational History   Occupation: Product manager of videos  Tobacco Use   Smoking status:  Every Day    Types: Cigars   Smokeless tobacco: Never  Vaping Use   Vaping Use: Never used  Substance and Sexual Activity   Alcohol use: Not Currently   Drug use: Yes    Types: Marijuana    Comment: Daily use of marijuana.   Sexual activity: Not Currently  Other Topics Concern   Not on file  Social History Narrative   Lives at home his mother.   3-4 sodas per week.   Right-handed.   Social Determinants of Health   Financial Resource Strain: Not on file  Food Insecurity: Not on file  Transportation Needs: Not on file  Physical Activity: Not on file  Stress: Not on file  Social Connections: Not on file   Additional Social History:   Sleep: Fair  Appetite:  Fair  Current Medications: Current Facility-Administered Medications  Medication Dose Route Frequency Provider Last Rate Last Admin   acetaminophen (TYLENOL) tablet 650 mg  650 mg Oral Q6H PRN Dylan Hughs, NP       alum & mag hydroxide-simeth (MAALOX/MYLANTA) 200-200-20 MG/5ML suspension 30 mL  30 mL Oral Q4H PRN Dylan Hughs, NP       carvedilol (COREG) tablet 25 mg  25 mg Oral BID WC Dylan Trovato, NP       diphenhydrAMINE (BENADRYL) capsule 50 mg  50 mg Oral TID PRN Dylan Hughs, NP   50 mg at 01/29/23 1434   Or   diphenhydrAMINE (BENADRYL) injection 50 mg  50 mg Intramuscular TID PRN Dylan Hughs, NP       divalproex (DEPAKOTE) DR tablet 500 mg  500 mg Oral BID Dylan Stammer I, NP   500 mg at 02/03/23 1046   Eslicarbazepine Acetate TABS 1,600 mg  1,600 mg Oral Daily Dylan Hughs, NP   1,600 mg at 02/03/23 0843   haloperidol (HALDOL) tablet 5 mg  5 mg Oral TID PRN Dylan Hughs, NP   5 mg at 01/29/23 1433   Or   haloperidol lactate (HALDOL) injection 5 mg  5 mg Intramuscular TID PRN Dylan Hughs, NP       loratadine (CLARITIN) tablet 10 mg  10 mg Oral Daily Dylan Hughs, NP   10 mg at 02/03/23 4098   LORazepam (ATIVAN) tablet 2 mg  2 mg Oral TID PRN Dylan Hughs,  NP   2 mg at 01/29/23 1433   Or   LORazepam (ATIVAN) injection 2 mg  2 mg Intramuscular TID PRN Dylan Hughs, NP       LORazepam (ATIVAN) injection 2 mg  2 mg Intramuscular Daily PRN Dylan Blue, NP       magnesium hydroxide (MILK OF MAGNESIA) suspension 30 mL  30 mL Oral Daily PRN Dylan Hughs, NP       paliperidone (INVEGA) 24 hr tablet 3 mg  3 mg Oral BID Dylan Blue, NP   3 mg  at 02/03/23 0842   traZODone (DESYREL) tablet 50 mg  50 mg Oral QHS PRN Dylan Hughs, NP   50 mg at 02/02/23 2035   zonisamide (ZONEGRAN) capsule 300 mg  300 mg Oral Daily Dylan Hughs, NP   300 mg at 02/03/23 1610    Lab Results:  Results for orders placed or performed during the hospital encounter of 01/27/23 (from the past 48 hour(s))  Ammonia     Status: None   Collection Time: 02/02/23  6:20 AM  Result Value Ref Range   Ammonia 29 9 - 35 umol/L    Comment: Performed at [facility], [address] [place], [place] [address]  Hepatic function panel     Status: None   Collection Time: 02/02/23  6:20 AM  Result Value Ref Range   Total Protein 7.4 6.5 - 8.1 g/dL   Albumin 4.5 3.5 - 5.0 g/dL   AST 17 15 - 41 U/L   ALT 20 0 - 44 U/L   Alkaline Phosphatase 45 38 - 126 U/L   Total Bilirubin 0.5 0.3 - 1.2 mg/dL   Bilirubin, Direct <4.0 0.0 - 0.2 mg/dL   Indirect Bilirubin NOT CALCULATED 0.3 - 0.9 mg/dL    Comment: Performed at [facility], [address] [place], [place] [address]    Blood Alcohol level:  Lab Results  Component Value Date   [facility] <10 01/26/2023   ETH <10 11/26/2022    Metabolic Disorder Labs: Lab Results  Component Value Date   HGBA1C 5.5 01/30/2023   MPG 111.15 01/30/2023   MPG 119.76 12/24/2019   Lab Results  Component Value Date   PROLACTIN 40.3 (H) 12/06/2022   Lab Results  Component Value Date   CHOL 151 01/30/2023   TRIG 93 01/30/2023   HDL 41 01/30/2023   CHOLHDL 3.7 01/30/2023   VLDL 19 01/30/2023    LDLCALC 91 01/30/2023   LDLCALC 105 (H) 01/03/2022    Physical Findings: AIMS:  , ,  ,  ,    CIWA:    COWS:     Musculoskeletal: Strength & Muscle Tone: within normal limits Gait & Station: normal Patient leans: N/A  Psychiatric Specialty Exam:  Presentation  General Appearance:  Appropriate for Environment; Fairly Groomed  Eye Contact: Good  Speech: Clear and Coherent  Speech Volume: Normal  Handedness: Right   Mood and Affect  Mood: Euthymic  Affect: Congruent   Thought Process  Thought Processes: Coherent  Descriptions of Associations:Intact  Orientation:Full (Time, Place and Person)  Thought Content:Logical  History of Schizophrenia/Schizoaffective disorder:No  Duration of Psychotic Symptoms:Greater than six months  Hallucinations:Hallucinations: None   Ideas of Reference:None  Suicidal Thoughts:Suicidal Thoughts: No   Homicidal Thoughts:Homicidal Thoughts: No    Sensorium  Memory: Immediate Good  Judgment: Fair  Insight: Fair   Art therapist  Concentration: Fair  Attention Span: Good  Recall: [employer] of Knowledge: Fair  Language: Fair   Psychomotor Activity  Psychomotor Activity: Psychomotor Activity: Normal    Assets  Assets: Communication Skills; Social Support; Housing; Resilience   Sleep  Sleep: Sleep: Good   Physical Exam: Physical Exam Vitals and nursing note reviewed.  HENT:     Mouth/Throat:     Pharynx: Oropharynx is clear.  Cardiovascular:     Pulses: Normal pulses.     Comments: Elevated diastolic pressure (108/93).  Patient is on Coreg. See MAR. Patient is currently in no apparent distress.  Vital signs will  be rechecked. Genitourinary:    Comments: Deferred Musculoskeletal:        General: Normal range of motion.     Cervical back: Normal range of motion.  Skin:    General: Skin is warm and dry.  Neurological:     General: No focal deficit present.      Mental Status: He is alert and oriented to person, place, and time.    Review of Systems  Constitutional:  Negative for chills, diaphoresis and fever.  HENT:  Negative for congestion and sore throat.   Respiratory:  Negative for cough, shortness of breath and wheezing.   Cardiovascular:  Negative for chest pain and palpitations.  Gastrointestinal:  Negative for heartburn and nausea.  Genitourinary:  Negative for dysuria.  Musculoskeletal:  Negative for myalgias.  Neurological:  Negative for dizziness, tingling, tremors, sensory change, speech change, focal weakness, seizures, loss of consciousness, weakness and headaches.  Psychiatric/Behavioral:  Positive for depression and substance abuse. Negative for hallucinations, memory loss and suicidal ideas. The patient is nervous/anxious and has insomnia.    Blood pressure (!) 145/88, pulse 65, temperature 97.7 F (36.5 C), temperature source Oral, resp. rate 20, SpO2 100 %. There is no height or weight on file to calculate BMI.  Treatment Plan Summary: Daily contact with patient to assess and evaluate symptoms and progress in treatment and Medication management.   Continue inpatient hospitalization.  Will continue today 02/03/2023 plan as below except where it is noted.   Diagnoses:  Principal Problem:   Bipolar Moon disorder, severe, current or most recent episode manic, with psychotic features (HCC) Active Problems:   Seizures (HCC)   Delta-9-tetrahydrocannabinol (THC) dependence (HCC)   GAD (generalized anxiety disorder)   Insomnia   Medications -Given LAI Invega Sustenna 234 mg IM on 5/11 for mood stabilization -Previously discontinued Risperdal on admission -Continue Ativan 2 mg IM daily as needed for status epilepticus -Continue Invega 3 mg po BID for mood stabilization. -Continue Depakote 500 mg po bid for mood stabilization (Level on 5/10 103. Ammonia level on 5/8 WNL at 34) -Increase Carvedilol from 12.5 mg to 25 BID for htn  (home med) -Continue Eslicarbazepine Acetate 1,600 mg daily (pt supplied med for seizures) -Continue Zonegran 300 mg po daily for seizures (home medication) -Continue trazodone 50 mg nightly as needed for sleep -Continue Claritin 10 mg daily for seasonal allergic rhinitis.  Agitation protocols: Cont as recommended;  -Benadryl 50 mg po or IM tid prn. -Haldol 5 mg po or IM tid prn.  -Lorazepam 2 mg po or IM tid prn.  Other PRNS -Continue Tylenol 650 mg every 6 hours PRN for mild pain -Continue Maalox 30 mg every 4 hrs PRN for indigestion -Continue Milk of Magnesia as needed every 6 hrs for constipation   Discharge Planning: Social work and case management to assist with discharge planning and identification of hospital follow-up needs prior to discharge Estimated LOS: 5-7 days Discharge Concerns: Need to establish a safety plan; Medication compliance and effectiveness Discharge Goals: Return home with outpatient referrals for mental health follow-up including medication management/psychotherapy  Dylan Blue, NP, pmhnp. 02/03/2023, 3:19 PMPatient ID: Dylan Moon, male  Patient ID: Dylan Moon, male   DOB: 08-28-1996, 28 y.o.   MRN: [id number] Patient ID: Dylan Moon, male   DOB: Sep 25, 1995, 27 y.o.   MRN: [id number]

## 2023-02-03 NOTE — Plan of Care (Signed)
  Problem: Education: Goal: Emotional status will improve Outcome: Progressing Goal: Mental status will improve Outcome: Progressing   Problem: Activity: Goal: Interest or engagement in activities will improve Outcome: Progressing   Problem: Coping: Goal: Ability to demonstrate self-control will improve Outcome: Progressing   Problem: Coping: Goal: Coping ability will improve Outcome: Progressing   Problem: Self-Concept: Goal: Will verbalize positive feelings about self Outcome: Progressing   

## 2023-02-03 NOTE — Progress Notes (Signed)
    02/03/23 2040  Psych Admission Type (Psych Patients Only)  Admission Status Involuntary  Psychosocial Assessment  Patient Complaints None  Eye Contact Fair  Facial Expression Animated  Affect Appropriate to circumstance  Speech Logical/coherent  Interaction Assertive  Motor Activity  (WDL)  Appearance/Hygiene Improved  Behavior Characteristics Cooperative  Mood Pleasant  Thought Process  Coherency Circumstantial  Content WDL  Delusions None reported or observed  Perception WDL  Hallucination None reported or observed  Judgment Poor  Confusion None  Danger to Self  Current suicidal ideation? Denies  Danger to Others  Danger to Others None reported or observed   On assessment, patient alert and oriented.  Patient is pleasant and cooperative.  Patient denies pain, SI/HI/AVH and contracts for safety.  Vitals obtained.  Administered PRN Trazodone per [facility] per patient request.  Patient is safe on the unit with q15 minute safety checks.

## 2023-02-03 NOTE — Progress Notes (Signed)
[facility] LCSW Progress Note   02/03/2023    2:37 PM  Dylan Moon   [id number]   Type of Contact and Topic:  Collateral  CSW spoke with patient mother and Csw answered questions around discharge.  Mother aware that patient will be discharged tomorrow.  Pt girlfriend will be picking patient up    Signed:  Anson Oregon MSW, LCSW, LCAS 02/03/2023 2:37 PM

## 2023-02-03 NOTE — Group Note (Signed)
[facility] LCSW Group Therapy Note  Date/Time: 1pm on 5/13  Type of Therapy and Topic:  Group Therapy:  Who Am I?  Self Esteem, Self-Actualization and Understanding Self.  Participation Level:  Active  Description of Group:    In this group patients will be asked to explore values, beliefs, truths, and morals as they relate to personal self.  Patients will be guided to discuss their thoughts, feelings, and behaviors related to what they identify as important to their true self. Patients will process together how values, beliefs and truths are connected to specific choices patients make every day. Each patient will be challenged to identify changes that they are motivated to make in order to improve self-esteem and self-actualization. This group will be process-oriented, with patients participating in exploration of their own experiences as well as giving and receiving support and challenge from other group members.  Therapeutic Goals: Patient will identify false beliefs that currently interfere with their self-esteem.  Patient will identify feelings, thought process, and behaviors related to self and will become aware of the uniqueness of themselves and of others.  Patient will be able to identify and verbalize values, morals, and beliefs as they relate to self. Patient will begin to learn how to build self-esteem/self-awareness by expressing what is important and unique to them personally.  Summary of Patient Progress: Dylan Moon participated appropriately in group.  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  Patient participated in group activity and respectful to peers.        Therapeutic Modalities:   Cognitive Behavioral Therapy Solution Focused Therapy Motivational Interviewing Brief Therapy   Dylan Picotte, LCSW, LCAS Clincal Social Worker  [facility]

## 2023-02-04 MED ORDER — PALIPERIDONE ER 3 MG PO TB24: 3.0000 mg | ORAL_TABLET | Freq: Two times a day (BID) | ORAL | 0 refills | Status: DC

## 2023-02-04 MED ORDER — TRAZODONE HCL 50 MG PO TABS: 50.0000 mg | ORAL_TABLET | Freq: Every evening | ORAL | 0 refills | Status: DC | PRN

## 2023-02-04 MED ORDER — PALIPERIDONE PALMITATE ER 156 MG/ML IM SUSY: 156.0000 mg | PREFILLED_SYRINGE | INTRAMUSCULAR | 0 refills | Status: DC

## 2023-02-04 MED ORDER — APTIOM 800 MG PO TABS: 1600.0000 mg | ORAL_TABLET | Freq: Every day | ORAL | 0 refills | Status: AC

## 2023-02-04 MED ORDER — LORATADINE 10 MG PO TABS: 10.0000 mg | ORAL_TABLET | Freq: Every day | ORAL | 0 refills | Status: DC

## 2023-02-04 MED ORDER — PALIPERIDONE PALMITATE ER 156 MG/ML IM SUSY
156.0000 mg | PREFILLED_SYRINGE | Freq: Once | INTRAMUSCULAR | Status: DC
  Filled 2023-02-04: qty 1

## 2023-02-04 MED ORDER — DIVALPROEX SODIUM 500 MG PO DR TAB: 500.0000 mg | DELAYED_RELEASE_TABLET | Freq: Two times a day (BID) | ORAL | 0 refills | Status: DC

## 2023-02-04 MED ORDER — CARVEDILOL 25 MG PO TABS: 25.0000 mg | ORAL_TABLET | Freq: Two times a day (BID) | ORAL | 0 refills | Status: DC

## 2023-02-04 MED ORDER — ZONISAMIDE 100 MG PO CAPS: 300.0000 mg | ORAL_CAPSULE | Freq: Every day | ORAL | 0 refills | Status: AC

## 2023-02-04 NOTE — Progress Notes (Signed)
  [facility] Adult Case Management Discharge Plan :  Will you be returning to the same living situation after discharge:  Yes,  home At discharge, do you have transportation home?: Yes,  girlfriend picking up Do you have the ability to pay for your medications: Yes,  insurance  Release of information consent forms completed and in the chart;  Patient's signature needed at discharge.  Patient to Follow up at:  Follow-up Information     [facility]. Go to.   Why: You have a follow up appointment for med management and therapy on May 16th at 10:25am. Contact information: [address] [place], [place]  (ph) [phone]        [facility] Call.   Why: A referral has been placed on your behalf for mental health wrap around services. Please call to set up an intake appointment and get connected to services. Contact information: [address] [place] [address] [phone]                 Next level of care provider has access to [facility] Link:no  Safety Planning and Suicide Prevention discussed: Yes,  mother, [name]     Has patient been referred to the Quitline?: Patient refused referral for treatment  Patient has been referred for addiction treatment: No known substance use disorder.   Lataya Varnell E Jalayna Josten, LCSW 02/04/2023, 9:44 AM

## 2023-02-04 NOTE — Discharge Summary (Signed)
Physician Discharge Summary Note  Patient:  Dylan Moon is an 27 y.o., male MRN:  [id number] DOB:  04-Oct-1995 Patient phone:  [phone] (home)  Patient address:   [address] [place] [address],  Total Time spent with patient: 45 minutes  Date of Admission:  01/27/2023 Date of Discharge: 02/04/2023  Reason for Admission:   Dylan Moon is a 27 yo AAM with past mental health diagnoses of ADHD, PTSD, bipolar d/o, GAD & a medical diagnosis of seizure d/o who presented to the [facility] health urgent care with law enforcement accompanied by his mother on 05/03 after she involuntarily committed him for aggressive behaviors & paranoia; As her documentation from the [facility], pt broke a porcelain toilet at home with his bare hands, thought someone was trying to set him up, and thought that there were fake people on his social media account. [name] mother reported that aggression started his most recent seizure activity on 05/03, and as per chart review, he was seen and treated in the ER for seizures on 05/03. For  this admission, pt was transferred to this [facility] for treatment and stabilization of his mental status.    Principal Problem: Bipolar I disorder, severe, current or most recent episode manic, with psychotic features [facility]) Discharge Diagnoses: Principal Problem:   Bipolar I disorder, severe, current or most recent episode manic, with psychotic features (HCC) Active Problems:   Seizures (HCC)   Delta-9-tetrahydrocannabinol (THC) dependence (HCC)   GAD (generalized anxiety disorder)   Insomnia  Past Psychiatric History: See H & P  Past Medical History:  Past Medical History:  Diagnosis Date   ADHD    Bipolar 1 disorder (HCC)    Schizoaffective disorder (HCC)    Scoliosis    Seizures (HCC)    most recent 12/02/17    Past Surgical History:  Procedure Laterality Date   NO PAST SURGERIES     Family History:  Family History  Problem Relation Age of  Onset   Diabetes Mother    Hypertension Mother    Cancer Other    Diabetes Father    Seizures Maternal Grandfather    Family Psychiatric  History: See H & P Social History:  Social History   Substance and Sexual Activity  Alcohol Use Not Currently     Social History   Substance and Sexual Activity  Drug Use Yes   Types: Marijuana   Comment: Daily use of marijuana.    Social History   Socioeconomic History   Marital status: Single    Spouse name: Not on file   Number of children: 0   Years of education: HS   Highest education level: Not on file  Occupational History   Occupation: Product manager of videos  Tobacco Use   Smoking status: Every Day    Types: Cigars   Smokeless tobacco: Never  Vaping Use   Vaping Use: Never used  Substance and Sexual Activity   Alcohol use: Not Currently   Drug use: Yes    Types: Marijuana    Comment: Daily use of marijuana.   Sexual activity: Not Currently  Other Topics Concern   Not on file  Social History Narrative   Lives at home his mother.   3-4 sodas per week.   Right-handed.   Social Determinants of Health   Financial Resource Strain: Not on file  Food Insecurity: Not on file  Transportation Needs: Not on file  Physical Activity: Not on file  Stress: Not  on file  Social Connections: Not on file    Hospital Course:  During the patient's hospitalization, patient had extensive initial psychiatric evaluation, and follow-up psychiatric evaluations every day. Psychiatric diagnoses provided upon initial assessment as listed above. Patient's psychiatric medications were adjusted on admission as follows: -Discontinued Risperdal -Started Ativan 2 mg IM daily as needed for status epilepticus -Started Invega 3 mg BID -Continued Depakote 250 mg BID (Level due on 5/8 along with ammonia level) -Continued Carvedilol 6.25 mg for htn (home med) -Continued Eslicarbazepine Acetate 1,600mg  daily (pt supplied med for  seizures) -Continued Zonegran 300 mg for seizures (home medication) -Continued trazodone 50 mg nightly as needed for sleep -Continued Claritin 10 mg daily for seasonal allergic rhinitis -Continued agitation protocol medications: Benadryl/Ativan/Haldol as per the [facility]   During the hospitalization, other adjustments were made to the patient's psychiatric medication regimen. Risperdal on admission was discontinued on admission.    Medications at discharge are as follows: -Given LAI Invega Sustenna 234 mg IM on 5/11 for mood stabilization. -Next dose of 156 mg Invega Sustenna LAI Inj is due on 05/16 @ 1045 when pt presents to his appointment at [facility]. Pick up the medication which has been sent to preferred pharmacy and take to that appointment for administration.   -Continue Invega 3 mg po BID for mood stabilization x one month, then stop, and continue with the monthly Invega Sustenna LAI 156 mg. EKG with Qtc 413, Lipid panel WNL. HA1C WNL at 5.5.   -Continue Depakote 500 mg po bid for mood stabilization.  [name] mother had wanted for him to be off Depakote at admission due to medication previously causing elevated ammonia levels. This medication was continued at a lower dose of 250 mg BID on admission. Due to pt's h/o seizures, med was not discontinued as weaning medication off too fast posed a risk of seizures. Over the course of this hospitalization, medication has been increased, and pt seems to have benefited from this increase, as mood is more stable.  Medication is currently at 500 mg BID, and last Valproic acid level on 5/10 was 103. Ammonia level 3 days ago was WNL at 34.  Ammonia level repeated today was within normal limits at 29.  LFTs remain within normal limits.  We will therefore continue Depakote at discharge at 500 mg BID as pt is benefiting from it, without any negative impacts from the medication at this time. Outpatient provider will need to determine continuity of  this medication based on patient's symptoms.     -Continue Carvedilol 25 BID for htn (home med)-Medication increased from 6.25 over the course of this hospitalization, with SBP still trending in the 140s. Pt educated to f/u with PCP regarding elevated BP.   -Continue Eslicarbazepine Acetate 1,600 mg daily (pt supplied med for seizures) -Continue Zonegran 300 mg po daily for seizures (home medication)-Educated to f/u with PCP regarding Seizure d/o   -Continue trazodone 50 mg nightly as needed for sleep -Continue Claritin 10 mg daily for seasonal allergic rhinitis.   Patient's care was discussed during the interdisciplinary team meeting every day during the hospitalization. The patient denies having side effects to prescribed psychiatric medication. Gradually, patient started adjusting to milieu. The patient was evaluated each day by a clinical provider to ascertain response to treatment. Improvement was noted by the patient's report of decreasing symptoms, improved sleep and appetite, affect, medication tolerance, behavior, and participation in unit programming.  Patient was asked each day to complete a self  inventory noting mood, mental status, pain, new symptoms, anxiety and concerns.     Symptoms were reported as significantly decreased or resolved completely by discharge. On day of discharge, the patient reports that their mood is stable. The patient denied having suicidal thoughts for more than 48 hours prior to discharge.  Patient denies having homicidal thoughts.  Patient denies having auditory hallucinations.  Patient denies any visual hallucinations or other symptoms of psychosis. The patient was motivated to continue taking medication with a goal of continued improvement in mental health.    The patient reports their target psychiatric symptoms of depression, anxiety, insomnia and psychosis responded well to the psychiatric medications, and the patient reports overall benefit from this  psychiatric hospitalization. Supportive psychotherapy was provided to the patient. The patient also participated in regular group therapy while hospitalized. Coping skills, problem solving as well as relaxation therapies were also part of the unit programming.   Labs were reviewed with the patient, and abnormal results were discussed with the patient.   The patient is able to verbalize their individual safety plan to this provider.   # It is recommended to the patient to continue psychiatric medications as prescribed, after discharge from the hospital.     # It is recommended to the patient to follow up with your outpatient psychiatric provider and PCP.   # It was discussed with the patient, the impact of alcohol, drugs, tobacco have been there overall psychiatric and medical wellbeing, and total abstinence from substance use was recommended the patient.ed.   # Prescriptions provided or sent directly to preferred pharmacy at discharge. Patient agreeable to plan. Given opportunity to ask questions. Appears to feel comfortable with discharge.    # In the event of worsening symptoms, the patient is instructed to call the crisis hotline, 911 and or go to the nearest ED for appropriate evaluation and treatment of symptoms. To follow-up with primary care provider for other medical issues, concerns and or health care needs   # Patient was discharged home  with a plan to follow up as noted below.   Physical Findings: AIMS: 0 CIWA:  n/a COWS:   n/a  Musculoskeletal: Strength & Muscle Tone: within normal limits Gait & Station: normal Patient leans: N/A   Psychiatric Specialty Exam:  Presentation  General Appearance:  Appropriate for Environment; Fairly Groomed  Eye Contact: Good  Speech: Clear and Coherent  Speech Volume: Normal  Handedness: Right   Mood and Affect  Mood: Euthymic  Affect: Appropriate; Congruent   Thought Process  Thought Processes: Coherent  Descriptions of Associations:Intact  Orientation:Full (Time, Place and Person)  Thought Content:Logical  History of Schizophrenia/Schizoaffective disorder:Yes  Duration of Psychotic Symptoms:Greater than six months  Hallucinations:Hallucinations: None  Ideas of Reference:None  Suicidal Thoughts:Suicidal Thoughts: No  Homicidal Thoughts:Homicidal Thoughts: No   Sensorium  Memory: Immediate Good  Judgment: Fair  Insight: Fair   Executive Functions  Concentration: Good  Attention Span: Good  Recall: Good  Fund of Knowledge: Fair  Language: Good   Psychomotor Activity  Psychomotor Activity: Psychomotor Activity: Normal   Assets  Assets: Communication Skills; Social Support   Sleep  Sleep: Sleep: Good    Physical Exam: Physical Exam Review of Systems  Psychiatric/Behavioral:  Positive for depression (Denies SI/HI/AVH, denies paranoia and verbally contracts for safety outside of this [facility]) and substance abuse (Educated on the negative impact of substance use on his mental health and on the need to cease using). Negative for hallucinations and suicidal ideas. The patient is nervous/anxious (Resolving on current meds) and has insomnia (Resolving on current meds).    Blood pressure (!) 140/96, pulse 80,  temperature 98.7 F (37.1 C), temperature source Oral, resp. rate 20, SpO2 100 %. There is no height or weight on file to calculate BMI.   Social History   Tobacco Use  Smoking Status Every Day   Types: Cigars  Smokeless Tobacco Never   Tobacco Cessation:  N/A, patient does not currently use tobacco products   Blood Alcohol level:  Lab Results  Component Value Date   ETH <10 01/26/2023   ETH <10 11/26/2022    Metabolic Disorder Labs:  Lab Results  Component Value Date   HGBA1C 5.5 01/30/2023   MPG 111.15 01/30/2023   MPG 119.76 12/24/2019   Lab Results  Component Value Date   PROLACTIN 40.3 (H) 12/06/2022   Lab Results  Component Value Date   CHOL 151 01/30/2023   TRIG 93 01/30/2023   HDL 41 01/30/2023   CHOLHDL 3.7 01/30/2023   VLDL 19 01/30/2023   LDLCALC 91 01/30/2023   LDLCALC 105 (H) 01/03/2022    See Psychiatric Specialty Exam and Suicide Risk Assessment completed by Attending Physician prior to discharge.  Discharge destination:  Home  Is patient on multiple antipsychotic therapies at discharge:  No   Has Patient had three or more failed trials of antipsychotic monotherapy by history:  No  Recommended Plan for Multiple Antipsychotic Therapies: NA  Allergies as of 02/04/2023       Reactions   Keppra [levetiracetam] Other (See Comments)   Irritability, mental status changes   Tramadol Other (See Comments)   Mixed with his other meds can cause seizures    Vimpat [lacosamide] Other (See Comments)   Causes anger, mental status changes        Medication List     STOP taking these medications    cetirizine 10 MG tablet Commonly known as: ZYRTEC Replaced by: loratadine 10 MG tablet   clonazePAM 0.5 MG disintegrating tablet Commonly  known as: KLONOPIN   cloNIDine 0.1 MG tablet Commonly known as: CATAPRES   risperiDONE 2 MG tablet Commonly known as: RISPERDAL   Vitamin D (Ergocalciferol) 1.25 MG (50000 UNIT) Caps capsule Commonly known  as: DRISDOL       TAKE these medications      Indication  Aptiom 800 MG Tabs Generic drug: Eslicarbazepine Acetate Take 1,600 mg by mouth daily. Start taking on: Feb 05, 2023  Indication: seizure activity   carvedilol 25 MG tablet Commonly known as: COREG Take 1 tablet (25 mg total) by mouth 2 (two) times daily with a meal. What changed:  medication strength how much to take  Indication: High Blood Pressure Disorder   divalproex 500 MG DR tablet Commonly known as: DEPAKOTE Take 1 tablet (500 mg total) by mouth 2 (two) times daily. What changed:  medication strength how much to take  Indication: Mood stabilization   loratadine 10 MG tablet Commonly known as: CLARITIN Take 1 tablet (10 mg total) by mouth daily. Start taking on: Feb 05, 2023 Replaces: cetirizine 10 MG tablet  Indication: Hayfever   paliperidone 156 MG/ML Susy injection Commonly known as: INVEGA SUSTENNA Inject 1 mL (156 mg total) into the muscle every 30 (thirty) days. Start taking on: Feb 06, 2023  Indication: Schizoaffective Disorder   paliperidone 3 MG 24 hr tablet Commonly known as: INVEGA Take 1 tablet (3 mg total) by mouth 2 (two) times daily.  Indication: Schizoaffective Disorder, mood stabilization   traZODone 50 MG tablet Commonly known as: DESYREL Take 1 tablet (50 mg total) by mouth at bedtime as needed for sleep.  Indication: Trouble Sleeping   zonisamide 100 MG capsule Commonly known as: ZONEGRAN Take 3 capsules (300 mg total) by mouth daily.  Indication: Focal Epilepsy, seizures        Follow-up Information     [facility]. Go to.   Why: You have a follow up appointment for med management and therapy on May 16th at 10:25am. Contact information: [address] [place], [place]  (ph) [phone]        [facility] Call.   Why: A referral has been placed on your behalf for mental health wrap around services.  Please call to set up an intake appointment and get connected to services. Contact information: [address] [place] [address] [phone]                Signed: [name], NP 02/04/2023, 4:09 PM

## 2023-02-04 NOTE — Progress Notes (Signed)
Pt discharged to lobby. Pt was stable and appreciative at that time. All papers and prescriptions were given and valuables returned. Verbal understanding expressed. Denies SI/HI and A/VH. Pt given opportunity to express concerns and ask questions.  
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
## 2023-02-06 ENCOUNTER — Other Ambulatory Visit (HOSPITAL_COMMUNITY): Payer: Self-pay

## 2023-02-07 ENCOUNTER — Other Ambulatory Visit: Payer: Self-pay | Admitting: Psychiatry

## 2023-07-02 ENCOUNTER — Ambulatory Visit (HOSPITAL_COMMUNITY): Payer: MEDICAID | Admitting: Student in an Organized Health Care Education/Training Program

## 2023-07-14 ENCOUNTER — Encounter (HOSPITAL_COMMUNITY): Payer: Self-pay

## 2023-07-14 ENCOUNTER — Emergency Department (HOSPITAL_COMMUNITY)
Admission: EM | Admit: 2023-07-14 | Discharge: 2023-07-16 | Disposition: A | Discharge status: Home / Self Care | Payer: MEDICAID | Attending: Emergency Medicine | Admitting: Emergency Medicine

## 2023-07-14 ENCOUNTER — Other Ambulatory Visit: Payer: Self-pay

## 2023-07-14 ENCOUNTER — Ambulatory Visit (HOSPITAL_COMMUNITY)
Admission: EM | Admit: 2023-07-14 | Discharge: 2023-07-14 | Disposition: A | Discharge status: Other Acute Inpatient Hospital | Payer: MEDICAID | Attending: Psychiatry | Admitting: Psychiatry

## 2023-07-14 DIAGNOSIS — Z79899 Other long term (current) drug therapy: Secondary | ICD-10-CM | POA: Insufficient documentation

## 2023-07-14 DIAGNOSIS — R456 Violent behavior: Secondary | ICD-10-CM | POA: Insufficient documentation

## 2023-07-14 DIAGNOSIS — F250 Schizoaffective disorder, bipolar type: Secondary | ICD-10-CM

## 2023-07-14 DIAGNOSIS — R000 Tachycardia, unspecified: Secondary | ICD-10-CM | POA: Insufficient documentation

## 2023-07-14 DIAGNOSIS — F312 Bipolar disorder, current episode manic severe with psychotic features: Secondary | ICD-10-CM | POA: Diagnosis not present

## 2023-07-14 DIAGNOSIS — D72829 Elevated white blood cell count, unspecified: Secondary | ICD-10-CM | POA: Diagnosis not present

## 2023-07-14 DIAGNOSIS — F29 Unspecified psychosis not due to a substance or known physiological condition: Secondary | ICD-10-CM | POA: Diagnosis present

## 2023-07-14 DIAGNOSIS — G40909 Epilepsy, unspecified, not intractable, without status epilepticus: Secondary | ICD-10-CM | POA: Insufficient documentation

## 2023-07-14 DIAGNOSIS — R4689 Other symptoms and signs involving appearance and behavior: Secondary | ICD-10-CM

## 2023-07-14 LAB — ETHANOL: Alcohol, Ethyl (B): <10 mg/dL (ref <10)

## 2023-07-14 LAB — CBC WITH DIFFERENTIAL/PLATELET
Abs Immature Granulocytes: 0.05 10*3/uL (ref 0.00–0.07)
Basophils Absolute: 0 10*3/uL (ref 0.0–0.1)
Basophils Relative: 0 %
Eosinophils Absolute: 0 10*3/uL (ref 0.0–0.5)
Eosinophils Relative: 0 %
HCT: 42.6 % (ref 39.0–52.0)
Hemoglobin: 14.1 g/dL (ref 13.0–17.0)
Immature Granulocytes: 0 %
Lymphocytes Relative: 8 %
Lymphs Abs: 0.9 10*3/uL (ref 0.7–4.0)
MCH: 28 pg (ref 26.0–34.0)
MCHC: 33.1 g/dL (ref 30.0–36.0)
MCV: 84.7 fL (ref 80.0–100.0)
Monocytes Absolute: 1.1 10*3/uL — ABNORMAL HIGH (ref 0.1–1.0)
Monocytes Relative: 10 %
Neutro Abs: 9.8 10*3/uL — ABNORMAL HIGH (ref 1.7–7.7)
Neutrophils Relative %: 82 %
Platelets: 210 10*3/uL (ref 150–400)
RBC: 5.03 MIL/uL (ref 4.22–5.81)
RDW: 13.6 % (ref 11.5–15.5)
WBC: 11.9 10*3/uL — ABNORMAL HIGH (ref 4.0–10.5)
nRBC: 0 % (ref 0.0–0.2)

## 2023-07-14 LAB — COMPREHENSIVE METABOLIC PANEL
ALT: 13 U/L (ref 0–44)
AST: 27 U/L (ref 15–41)
Albumin: 3.9 g/dL (ref 3.5–5.0)
Alkaline Phosphatase: 34 U/L — ABNORMAL LOW (ref 38–126)
Anion gap: 10 (ref 5–15)
BUN: 14 mg/dL (ref 6–20)
CO2: 23 mmol/L (ref 22–32)
Calcium: 8.9 mg/dL (ref 8.9–10.3)
Chloride: 104 mmol/L (ref 98–111)
Creatinine, Ser: 1.12 mg/dL (ref 0.61–1.24)
GFR, Estimated: >60 mL/min (ref >60)
Glucose, Bld: 113 mg/dL — ABNORMAL HIGH (ref 70–99)
Potassium: 4.6 mmol/L (ref 3.5–5.1)
Sodium: 137 mmol/L (ref 135–145)
Total Bilirubin: 0.6 mg/dL (ref 0.3–1.2)
Total Protein: 6.5 g/dL (ref 6.5–8.1)

## 2023-07-14 LAB — RAPID URINE DRUG SCREEN, HOSP PERFORMED
Amphetamines: NOT DETECTED
Barbiturates: NOT DETECTED
Benzodiazepines: NOT DETECTED
Cocaine: NOT DETECTED
Opiates: NOT DETECTED
Tetrahydrocannabinol: POSITIVE — AB

## 2023-07-14 LAB — SALICYLATE LEVEL: Salicylate Lvl: <7 mg/dL — ABNORMAL LOW (ref 7.0–30.0)

## 2023-07-14 LAB — ACETAMINOPHEN LEVEL: Acetaminophen (Tylenol), Serum: <10 ug/mL — ABNORMAL LOW (ref 10–30)

## 2023-07-14 MED ORDER — ACETAMINOPHEN 325 MG PO TABS: 650.0000 mg | ORAL_TABLET | Freq: Four times a day (QID) | ORAL | Status: DC | PRN

## 2023-07-14 MED ORDER — OLANZAPINE 10 MG PO TBDP
ORAL_TABLET | ORAL | Status: AC
  Administered 2023-07-14: 10 mg
  Filled 2023-07-14: qty 1

## 2023-07-14 MED ORDER — LEVOCARNITINE 1 GM/10ML PO SOLN
1000.0000 mg | Freq: Two times a day (BID) | ORAL | Status: DC
  Administered 2023-07-15 – 2023-07-16 (×2): 1000 mg via ORAL
  Filled 2023-07-14 (×4): qty 10

## 2023-07-14 MED ORDER — TRAZODONE HCL 50 MG PO TABS: 50.0000 mg | ORAL_TABLET | Freq: Every evening | ORAL | Status: DC | PRN

## 2023-07-14 MED ORDER — ZONISAMIDE 100 MG PO CAPS
300.0000 mg | ORAL_CAPSULE | Freq: Every day | ORAL | Status: DC
  Administered 2023-07-15 – 2023-07-16 (×2): 300 mg via ORAL
  Filled 2023-07-14 (×3): qty 3

## 2023-07-14 MED ORDER — OLANZAPINE 10 MG PO TBDP: 10.0000 mg | ORAL_TABLET | Freq: Three times a day (TID) | ORAL | Status: DC | PRN

## 2023-07-14 MED ORDER — LORAZEPAM 1 MG PO TABS: 1.0000 mg | ORAL_TABLET | ORAL | Status: DC | PRN

## 2023-07-14 MED ORDER — ZIPRASIDONE MESYLATE 20 MG IM SOLR: 20.0000 mg | INTRAMUSCULAR | Status: DC | PRN

## 2023-07-14 MED ORDER — DIPHENHYDRAMINE HCL 50 MG/ML IJ SOLN: 25.0000 mg | Freq: Once | INTRAMUSCULAR | Status: DC

## 2023-07-14 MED ORDER — HYDROXYZINE HCL 25 MG PO TABS: 25.0000 mg | ORAL_TABLET | Freq: Three times a day (TID) | ORAL | Status: DC | PRN

## 2023-07-14 MED ORDER — HALOPERIDOL LACTATE 5 MG/ML IJ SOLN
5.0000 mg | Freq: Once | INTRAMUSCULAR | Status: AC
  Administered 2023-07-14: 5 mg via INTRAMUSCULAR
  Filled 2023-07-14: qty 1

## 2023-07-14 MED ORDER — TRAZODONE HCL 50 MG PO TABS
50.0000 mg | ORAL_TABLET | Freq: Every evening | ORAL | Status: DC | PRN
  Administered 2023-07-15: 50 mg via ORAL
  Filled 2023-07-14: qty 1

## 2023-07-14 MED ORDER — MAGNESIUM HYDROXIDE 400 MG/5ML PO SUSP: 30.0000 mL | Freq: Every day | ORAL | Status: DC | PRN

## 2023-07-14 MED ORDER — OLANZAPINE 5 MG PO TABS: 5.0000 mg | ORAL_TABLET | Freq: Two times a day (BID) | ORAL | Status: DC

## 2023-07-14 MED ORDER — ESLICARBAZEPINE ACETATE 800 MG PO TABS
1600.0000 mg | ORAL_TABLET | Freq: Every day | ORAL | Status: DC
  Filled 2023-07-14: qty 2

## 2023-07-14 MED ORDER — CARVEDILOL 12.5 MG PO TABS
25.0000 mg | ORAL_TABLET | Freq: Two times a day (BID) | ORAL | Status: DC
  Administered 2023-07-16: 25 mg via ORAL
  Filled 2023-07-14 (×2): qty 2

## 2023-07-14 MED ORDER — DIVALPROEX SODIUM 500 MG PO DR TAB
500.0000 mg | DELAYED_RELEASE_TABLET | Freq: Two times a day (BID) | ORAL | Status: DC
  Administered 2023-07-15 – 2023-07-16 (×2): 500 mg via ORAL
  Filled 2023-07-14 (×2): qty 1

## 2023-07-14 MED ORDER — PALIPERIDONE ER 3 MG PO TB24
3.0000 mg | ORAL_TABLET | Freq: Two times a day (BID) | ORAL | Status: DC
  Filled 2023-07-14: qty 1

## 2023-07-14 MED ORDER — LORAZEPAM 2 MG/ML IJ SOLN
2.0000 mg | Freq: Once | INTRAMUSCULAR | Status: AC
  Administered 2023-07-14: 2 mg via INTRAMUSCULAR
  Filled 2023-07-14: qty 1

## 2023-07-14 MED ORDER — ZIPRASIDONE MESYLATE 20 MG IM SOLR
INTRAMUSCULAR | Status: AC
  Administered 2023-07-14: 20 mg via INTRAMUSCULAR
  Filled 2023-07-14: qty 20

## 2023-07-14 MED ORDER — LORATADINE 10 MG PO TABS
10.0000 mg | ORAL_TABLET | Freq: Every day | ORAL | Status: DC
  Administered 2023-07-16: 10 mg via ORAL
  Filled 2023-07-14 (×2): qty 1

## 2023-07-14 MED ORDER — ALUM & MAG HYDROXIDE-SIMETH 200-200-20 MG/5ML PO SUSP: 30.0000 mL | ORAL | Status: DC | PRN
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
## 2023-07-14 NOTE — ED Notes (Signed)
Ivc paperwork will be uploaded to efile paperwork located in green zone

## 2023-07-14 NOTE — ED Notes (Signed)
Pt noted laughing while running out of unit. Security guard present behind pt. This nurse noted pt and security guard running down hallway. This nurse ran toward front of ED, pt noted walking toward orange zone with security present. Pt escorted back to ED bed 50. Order for restraints received.

## 2023-07-14 NOTE — ED Notes (Signed)
Pt stating he is leaving. Security and Patent examiner present at desk and doorway.

## 2023-07-14 NOTE — Progress Notes (Signed)
LCSW Progress Note  [id number]   Dylan Moon  07/14/2023  2:40 PM  Description:   Inpatient Psychiatric Referral  Patient was recommended inpatient per Vernard Gambles, NP. There are no available beds at [facility], per [facility] Rona Ravens, RN. Patient was referred to the following out of network facilities:   Destination  Service Provider Address Phone Fax  [facility]  [address] [place] [address] [phone]  [facility] [address]  [address], [place] [address] [phone]  [facility]  [address] [place], [place] [address] [phone]  [facility]  [address] [place], [place] [address] [phone]  [facility]  [address] [place] [address] [phone]  [facility]  [address] [place], [place] [address] [phone]  [facility]  [address] [place] [address] [phone]  [facility]  [address] [place] [address] [phone]  [facility]  [address] [place] [address] [phone]  [facility]  [address], [place] [address] [phone]  [facility] [place]  [address] [place] [address] [phone]  [facility]  [place] [phone]  [facility]  [address] [place], [place] [phone]  [facility]  [address] [place] [address] [phone]  [facility]  [address], [place] [address] [phone]  [facility]  [address] [place], [place] [address] [phone]  [phone]  [facility]  [address] [place] [address] [phone]  [facility]  [place] [phone]  [facility] [address]  [place] [address] [phone]    Situation ongoing, CSW to continue following and update chart as more information becomes available.    Cathie Beams, MSW, LCSW  07/14/2023 2:40 PM
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
## 2023-07-14 NOTE — ED Notes (Addendum)
Pt threatening to staff

## 2023-07-14 NOTE — ED Notes (Signed)
Pt trying to get out of restraints, yelling at staff, jerking at straps, MD aware.
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
## 2023-07-14 NOTE — ED Notes (Signed)
No change in pt condition.

## 2023-07-14 NOTE — Discharge Instructions (Signed)
Transfer to [facility] while awaiting IP psychiatric bed availability

## 2023-07-14 NOTE — Progress Notes (Signed)
   07/14/23 1253  [facility] Triage Screening (Walk-ins at [facility] only)  How Did You Hear About [place]? Legal System  What Is the Reason for Your Visit/Call Today? Unable to asess.  How Long Has This Been Causing You Problems? <Week  Have You Recently Had Any Thoughts About Hurting Yourself? No  Are You Planning to Commit Suicide/Harm Yourself At This time? No  Have you Recently Had Thoughts About Hurting Someone Karolee Ohs? No  Are You Planning To Harm Someone At This Time? No  Are you currently experiencing any auditory, visual or other hallucinations? No  Have You Used Any Alcohol or Drugs in the Past 24 Hours? No  Do you have any current medical co-morbidities that require immediate attention? No  What Do You Feel Would Help You the Most Today? Medication(s)  If access to [facility] was not available, would you have sought care in the Emergency Department? No  Determination of Need Urgent (48 hours)  Options For Referral Medication Management;Intensive Outpatient Therapy
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
## 2023-07-14 NOTE — ED Notes (Addendum)
Spoke with mother on phone, update given. She was made aware of pt in restraints at this time.

## 2023-07-14 NOTE — ED Notes (Addendum)
[name] mother(Beverly Jones) updated on pt's current ED status. Pt requesting mother to come pick him up, voiced to pt that he can not leave because he has been IVC'd. Pt requesting to use telephone, voiced to pt that only two calls can made per day. Voiced to that call privileges will resume tomorrow. Security and Patent examiner present at desk. Mother aware of visitation hours at this time.

## 2023-07-14 NOTE — ED Notes (Signed)
Pt attempting to get out of restraints, pt threatening to leave. Pt has gotten the right arm out of the restraint x 2.

## 2023-07-14 NOTE — ED Notes (Signed)
Pt request this nurse to call his mother. Call placed to mother, pt voiced to this nurse to inform his mother to "watch her back." Mother voiced understanding. While updating mother, pt yelling requesting to speak with mother on telephone, pt attempted to grab phone from this nurse by puling on telephone cord. Security and Patent examiner present and escorted pt back to room.

## 2023-07-14 NOTE — ED Notes (Signed)
IVC PAPER WORK IS BEING TAKEN OVER TO PURPLE BY NURSE KATRINA PATIENT MOVING TO 50

## 2023-07-14 NOTE — ED Notes (Signed)
In with pt, pt biting on plastic part of the clip on restraints, pt advised to stop, pt states that he thinks he chipped his tooth. I advised pt to stop and he said "I got to get out of here". I made pt aware that he would be staying the night and he preceded to continue biting on the restraints.
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
## 2023-07-14 NOTE — Progress Notes (Signed)
Patient has been denied by [facility] due to no appropriate beds available. Patient meets BH inpatient criteria per Eligha Bridegroom, NP. Patient has been faxed out to the following facilities:   [facility]  [address] [place] [address] [phone]  [facility] [address]  [address], [place] [address] [phone]  [facility] [address] [place]  [address] [place], [place] [address] [phone]  [facility]  [address] [place], [place] [address] [phone]  [facility]  [address] [place] [address] [phone]  [facility]  [address] [place], [place] [address] [phone]  [facility]  [address] [place] [address] [phone]  [facility]  [address] [place] [address] [phone]  [facility]  [address] [place] [address] [phone]  [facility]  [address] [place] [address] [phone]  [facility]  [address], [place] [address] [phone]  [facility] Behavioral Health  [place] [phone]  [facility] EFAX  [address] [place], [place] [phone]  [facility]  [address] [place] [address] [phone]  [facility]  [address], [place] [address] [phone]  [facility]  [address] [place], [place] [address] [phone]  [facility]  [address], [place] [address] [phone]  [facility] Inpatient [place]  [place] [phone]  [phone]  [facility] [address]  [place] [address] [phone]   Damita Dunnings, MSW, LCSW-A  10:23 PM 07/14/2023
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
## 2023-07-15 DIAGNOSIS — F312 Bipolar disorder, current episode manic severe with psychotic features: Secondary | ICD-10-CM

## 2023-07-15 MED ORDER — ESLICARBAZEPINE ACETATE 800 MG PO TABS
2.0000 | ORAL_TABLET | Freq: Every day | ORAL | Status: DC
  Administered 2023-07-16: 2 via ORAL
  Filled 2023-07-15 (×2): qty 2

## 2023-07-15 MED ORDER — LORAZEPAM 2 MG/ML IJ SOLN
2.0000 mg | Freq: Once | INTRAMUSCULAR | Status: AC
  Administered 2023-07-15: 2 mg via INTRAMUSCULAR
  Filled 2023-07-15: qty 1

## 2023-07-15 MED ORDER — DIPHENHYDRAMINE HCL 50 MG/ML IJ SOLN
25.0000 mg | Freq: Three times a day (TID) | INTRAMUSCULAR | Status: DC | PRN
  Administered 2023-07-15: 25 mg via INTRAMUSCULAR
  Filled 2023-07-15: qty 1

## 2023-07-15 MED ORDER — ZIPRASIDONE MESYLATE 20 MG IM SOLR
20.0000 mg | Freq: Four times a day (QID) | INTRAMUSCULAR | Status: DC | PRN
  Administered 2023-07-15: 20 mg via INTRAMUSCULAR
  Filled 2023-07-15: qty 20

## 2023-07-15 MED ORDER — STERILE WATER FOR INJECTION IJ SOLN
INTRAMUSCULAR | Status: AC
  Administered 2023-07-15: 1.2 mL
  Filled 2023-07-15: qty 10

## 2023-07-15 MED ORDER — DROPERIDOL 2.5 MG/ML IJ SOLN: 5.0000 mg | Freq: Once | INTRAMUSCULAR | Status: DC

## 2023-07-15 MED ORDER — MIDAZOLAM HCL 5 MG/5ML IJ SOLN: 5.0000 mg | Freq: Once | INTRAMUSCULAR | Status: DC

## 2023-07-15 MED ORDER — HALOPERIDOL 5 MG PO TABS: 10.0000 mg | ORAL_TABLET | Freq: Three times a day (TID) | ORAL | Status: DC | PRN

## 2023-07-15 MED ORDER — DIPHENHYDRAMINE HCL 25 MG PO CAPS: 25.0000 mg | ORAL_CAPSULE | Freq: Three times a day (TID) | ORAL | Status: DC | PRN

## 2023-07-15 MED ORDER — HALOPERIDOL LACTATE 5 MG/ML IJ SOLN
10.0000 mg | Freq: Three times a day (TID) | INTRAMUSCULAR | Status: DC | PRN
  Administered 2023-07-15: 10 mg via INTRAMUSCULAR
  Filled 2023-07-15: qty 2

## 2023-07-15 MED ORDER — HALOPERIDOL LACTATE 5 MG/ML IJ SOLN
10.0000 mg | Freq: Once | INTRAMUSCULAR | Status: AC
  Administered 2023-07-15: 10 mg via INTRAMUSCULAR
  Filled 2023-07-15: qty 2

## 2023-07-15 MED ORDER — LORAZEPAM 2 MG/ML IJ SOLN
2.0000 mg | Freq: Four times a day (QID) | INTRAMUSCULAR | Status: DC | PRN
  Administered 2023-07-15: 2 mg via INTRAMUSCULAR
  Filled 2023-07-15: qty 1

## 2023-07-15 MED ORDER — LORAZEPAM 1 MG PO TABS
2.0000 mg | ORAL_TABLET | Freq: Four times a day (QID) | ORAL | Status: DC | PRN
  Administered 2023-07-16: 2 mg via ORAL
  Filled 2023-07-15: qty 2

## 2023-07-15 MED ORDER — DIPHENHYDRAMINE HCL 50 MG/ML IJ SOLN
25.0000 mg | Freq: Once | INTRAMUSCULAR | Status: AC
  Administered 2023-07-15: 25 mg via INTRAMUSCULAR
  Filled 2023-07-15: qty 1

## 2023-07-15 MED ORDER — LORAZEPAM 1 MG PO TABS
1.0000 mg | ORAL_TABLET | Freq: Once | ORAL | Status: AC
  Administered 2023-07-15: 1 mg via ORAL
  Filled 2023-07-15: qty 1

## 2023-07-15 NOTE — ED Notes (Signed)
Pt still endorses desire to leave and was asking his sitter for money; PO meds was able to be given; EDP has assessed patient however, restraints has been unclipped while patient appears to be resting; RN will continue to monitor; [employer] and security made aware that restraint orders has expired; Pt still remains a high flight risk at this time; Barriers has been put in place to help deter patient from running-Monique,RN
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
## 2023-07-15 NOTE — ED Notes (Signed)
Holding PO meds at this time d/t pt sleeping. Will assess when pt awake if pt will take meds.

## 2023-07-15 NOTE — ED Notes (Signed)
Pt verbally aggressive, threatening staff, kicking the footboard of the bed, unable to verbalized understanding of what he has to do to get out of restraints. Pt screaming at staff.

## 2023-07-15 NOTE — Progress Notes (Signed)
LCSW Progress Note  [id number]   Dylan Moon  07/15/2023  1:05 PM  Description:   Inpatient Psychiatric Referral  Patient was recommended inpatient per Eligha Bridegroom, NP. There are no available beds at [facility], per [facility] Rona Ravens, RN. Patient was referred to the following out of network facilities:   Destination  Service Provider Address Phone Fax  [facility]  [address], [place] [address] [phone]  [facility] [address]  [address] [place] [address] [phone]  [facility]  [address] [place], [place] [address] [phone]  [facility]  [address] [place], [place] [address] [phone]  [facility]  [address] [place] [address] [phone]  [facility]  [address] [place], [place] [address] [phone]  [facility]  [address] [place] [address] [phone]  [facility]  [address] [place] [address] [phone]  [facility]  [address], [place] [address] [phone]  [facility]  [address] [place] [address] [phone]  [facility]  [address], [place] [address] [phone]  [facility] Management Behavioral Health  [place] [phone]  [facility] EFA[facility]  [address] [place], [place] [phone]  [facility]  [address] [place] [address] [phone]  [facility]  [address] [place], [place] [address] [phone]  [facility]  [address] [place], [place] [address] [phone]  [facility]  [address] [place] [address] [phone]  [facility] Psychiatry Inpatient [facility]  [place] [phone]  [facility] [address]  [place] [address] [phone]    Situation ongoing, CSW to continue following and update chart as more information becomes available.      Cathie Beams, MSW, LCSW  07/15/2023 1:05 PM

## 2023-07-15 NOTE — ED Notes (Signed)
Patient moved to Room 52 for safety of unit and so patient does not have direct sight or path to unlocked exit doors; Sitter remain at bedside; Pt is being verbally abusive to sitter and requesting his belongings to leave; [name] mother called to check behavior status of patient; Pt behaviors remain the same at this time; [employer] in unit for secuirty-Monique,RN

## 2023-07-15 NOTE — ED Notes (Signed)
Pt refusing meds at this time

## 2023-07-15 NOTE — ED Notes (Signed)
Pt continues to be very paranoid, trying to get out of restraints, verbally aggressive. Notified EDP and BH NP about needing more meds for pt. Awaiting response.

## 2023-07-15 NOTE — ED Notes (Signed)
Pt kicking footboard and almost kicking it off the bed, foot board removed.

## 2023-07-15 NOTE — Progress Notes (Signed)
Patient has been denied by [facility] due to no appropriate beds available from the day shift AC. Patient meets BH inpatient criteria per Eligha Bridegroom, NP. Patient has been faxed out to the following facilities:    [facility]  [address] [place] [address] [phone]  [facility] [address]  [address], [place] [address] [phone]  [facility] [address] [place]  [address] [place], [place] [address] [phone]  [facility]  [address], [place] [address] [phone]  [facility]  [address] [place], [place] [address] [phone]  [facility]  [address] [place] [address] [phone]  [facility]  [address] [place] [address] [phone]  [facility]  [address] [place] [address] [phone]  [facility]  [address], [place] [address] [phone]  [facility]  [address] [place] [address] [phone]  [facility] BED Management Behavioral Health  [place] [phone]  [facility] EFAX  [address] [place], [place] [phone]  [facility]  [address], [place] [address] [phone]  [facility]  [address] [place], [place] [address] [phone]  [facility]  [address] [place], [place] [address] [phone]  [facility]  [address] [place] [address] [phone]  [facility] Psychiatry Inpatient [place]  [place] [phone]  [facility] [address]  [place] [address] [phone]    Damita Dunnings, MSW, LCSW-A  7:41 PM 07/15/2023

## 2023-07-15 NOTE — ED Notes (Signed)
RN attempted to have conversation with patient regarding criteria to be released from restraints; pt becomes agitated and tells staff to shut up; Pt did take PO meds from this RN; Pt continues to endorse that he will leave once restraints are off; Pt is aware of his new room and asked staff " Who is through the door to the left?" RN advised patient that the door he referring to is a locked door and he will not be able to get through it; Pt then states he is going to [facility] tomorrow with his girlfriend and nobody is going to stop his plans; [employer] remain in unit; Sitter in sight of patient; RN stopped conversation with patient due to viably increasing his agitation-Monique,RN

## 2023-07-15 NOTE — ED Notes (Signed)
Pt biting restraints, verbally aggressive and threatening staff.

## 2023-07-15 NOTE — ED Notes (Signed)
ED Provider at bedside. 

## 2023-07-15 NOTE — Progress Notes (Signed)
[facility] Psych ED Progress Note  07/15/2023 12:21 PM Latif ZADEN NASCA  MRN:  [id number]   Subjective:   Pt seen at [facility] for psychiatric reevaluation. Pt is in 4 point restraints upon assessment, pulling on restraints and trying to bite them off. He engaged minimally in assessment. Pt very discharge focused, stating he has to get to [facility] immediately, but unable to disclose what he needs to go to [facility] for. He continues to demand to be removed from restraints, I continued to explain criteria for restraints to be removed. Attempted to explain IVC and recommendation for IP, however, pt is unable to process information and keeps stating he will need to leave for [facility]. He is able to deny SI/HI. He denies AVH. Pt continues to be agitated, aggressive, tangential, pressured speech at times, and illogical thinking.   I spoke with his mother, Lowanda Foster, at [phone] who explained the patient was in jail for 2 months for various assault and threatening behavior charges. She also confirmed the patient did receive Invega Sustenna Injection on 07/06/23 while in jail. Pt was relased from jail a few days ago, and mom states he has been extremely paranoid and agitated.   Will continue recommendation for IVC and inpatient psychiatric treatment. Pt is currently scheduled Depakote 500 mg BID. Agitation PRN protocol in place.   Principal Problem: Bipolar I disorder, severe, current or most recent episode manic, with psychotic features (HCC) Diagnosis:  Principal Problem:   Bipolar I disorder, severe, current or most recent episode manic, with psychotic features [facility])   ED Assessment Time Calculation: Start Time: 1200 Stop Time: 1230 Total Time in Minutes (Assessment Completion): 30   Past Psychiatric History:  See above  [place] Scale:  Flowsheet Row ED from 07/14/2023 in [facility] Emergency Department at [facility] Most recent reading at 07/14/2023  2:31 PM ED from 07/14/2023 in [facility] Most recent reading at 07/14/2023 12:54 PM Ad[facility] (Dis[facility] Most recent reading at 01/27/2023 10:15 PM  C-SSRS RISK CATEGORY No Risk No Risk No Risk       Past Medical History:  Past Medical History:  Diagnosis Date   ADHD    Bipolar 1 disorder (HCC)    Schizoaffective disorder (HCC)    Scoliosis    Seizures (HCC)    most recent 12/02/17    Past Surgical History:  Procedure Laterality Date   NO PAST SURGERIES     Family History:  Family History  Problem Relation Age of Onset   Diabetes Mother    Hypertension Mother    Cancer Other    Diabetes Father    Seizures Maternal Grandfather    Family Psychiatric  History: unknown Social History:  Social History   Substance and Sexual Activity  Alcohol Use Not Currently     Social History   Substance and Sexual Activity  Drug Use Yes   Types: Marijuana   Comment: Daily use of marijuana.    Social History   Socioeconomic History   Marital status: Single    Spouse name: Not on file   Number of children: 0   Years of education: HS   Highest education level: Not on file  Occupational History   Occupation: Product manager of videos  Tobacco Use   Smoking status: Every Day    Types: Cigars   Smokeless tobacco: Never  Vaping Use   Vaping status: Never Used  Substance and Sexual Activity  Alcohol use: Not Currently   Drug use: Yes    Types: Marijuana    Comment: Daily use of marijuana.   Sexual activity: Not Currently  Other Topics Concern   Not on file  Social History Narrative   Lives at home his mother.   3-4 sodas per week.   Right-handed.   Social Determinants of Health   Financial Resource Strain: Not on file  Food Insecurity: Not on file  Transportation Needs: Not on file  Physical Activity: Not on file  Stress: Not on file  Social Connections: Not on file    Sleep: Poor  Appetite:   Fair  Current Medications: Current Facility-Administered Medications  Medication Dose Route Frequency Provider Last Rate Last Admin   carvedilol (COREG) tablet 25 mg  25 mg Oral BID WC Prosperi, Christian H, PA-C       diphenhydrAMINE (BENADRYL) capsule 25 mg  25 mg Oral Q8H PRN Eligha Bridegroom, NP       Or   diphenhydrAMINE (BENADRYL) injection 25 mg  25 mg Intramuscular Q8H PRN Eligha Bridegroom, NP   25 mg at 07/15/23 1147   divalproex (DEPAKOTE) DR tablet 500 mg  500 mg Oral BID Prosperi, Christian H, PA-C       Eslicarbazepine Acetate TABS 1,600 mg  1,600 mg Oral Daily Prosperi, Christian H, PA-C       haloperidol (HALDOL) tablet 10 mg  10 mg Oral Q8H PRN Eligha Bridegroom, NP       Or   haloperidol lactate (HALDOL) injection 10 mg  10 mg Intramuscular Q8H PRN Eligha Bridegroom, NP   10 mg at 07/15/23 1147   levOCARNitine (CARNITOR) 1 GM/10ML solution 1,000 mg  1,000 mg Oral BID Prosperi, Christian H, PA-C       loratadine (CLARITIN) tablet 10 mg  10 mg Oral Daily Prosperi, Christian H, PA-C       LORazepam (ATIVAN) tablet 2 mg  2 mg Oral Q6H PRN Eligha Bridegroom, NP       Or   LORazepam (ATIVAN) injection 2 mg  2 mg Intramuscular Q6H PRN Eligha Bridegroom, NP   2 mg at 07/15/23 1147   traZODone (DESYREL) tablet 50 mg  50 mg Oral QHS PRN Prosperi, Christian H, PA-C       zonisamide (ZONEGRAN) capsule 300 mg  300 mg Oral Daily Prosperi, Christian H, PA-C       Current Outpatient Medications  Medication Sig Dispense Refill   acetaminophen (TYLENOL) 500 MG tablet Take 1,000 mg by mouth every 6 (six) hours as needed for mild pain (pain score 1-3) or moderate pain (pain score 4-6).     carvedilol (COREG) 25 MG tablet Take 1 tablet (25 mg total) by mouth 2 (two) times daily with a meal. 60 tablet 0   divalproex (DEPAKOTE) 500 MG DR tablet Take 1 tablet (500 mg total) by mouth 2 (two) times daily. 60 tablet 0   escitalopram (LEXAPRO) 10 MG tablet Take 10 mg by mouth daily.     Eslicarbazepine  Acetate (APTIOM) 800 MG TABS Take 1,600 mg by mouth daily. 60 tablet 0   famotidine (PEPCID) 20 MG tablet Take 20 mg by mouth daily.     hydrOXYzine (ATARAX) 25 MG tablet Take 25 mg by mouth 2 (two) times daily as needed for anxiety.     levOCARNitine (CARNITOR) 330 MG tablet Take 990 mg by mouth 2 (two) times daily.     loratadine (CLARITIN) 10 MG tablet Take 1 tablet (10 mg  total) by mouth daily. (Patient taking differently: Take 10 mg by mouth daily as needed for allergies.) 30 tablet 0   paliperidone (INVEGA SUSTENNA) 156 MG/ML SUSY injection Inject 1 mL (156 mg total) into the muscle every 30 (thirty) days. 1 mL 0   traZODone (DESYREL) 50 MG tablet Take 1 tablet (50 mg total) by mouth at bedtime as needed for sleep. 30 tablet 0   zonisamide (ZONEGRAN) 100 MG capsule Take 3 capsules (300 mg total) by mouth daily. 90 capsule 0    Lab Results:  Results for orders placed or performed during the hospital encounter of 07/14/23 (from the past 48 hour(s))  CBC with Differential     Status: Abnormal   Collection Time: 07/14/23  2:34 PM  Result Value Ref Range   WBC 11.9 (H) 4.0 - 10.5 K/uL   RBC 5.03 4.22 - 5.81 MIL/uL   Hemoglobin 14.1 13.0 - 17.0 g/dL   HCT 16.1 09.6 - 04.5 %   MCV 84.7 80.0 - 100.0 fL   MCH 28.0 26.0 - 34.0 pg   MCHC 33.1 30.0 - 36.0 g/dL   RDW 40.9 81.1 - 91.4 %   Platelets 210 150 - 400 K/uL   nRBC 0.0 0.0 - 0.2 %   Neutrophils Relative % 82 %   Neutro Abs 9.8 (H) 1.7 - 7.7 K/uL   Lymphocytes Relative 8 %   Lymphs Abs 0.9 0.7 - 4.0 K/uL   Monocytes Relative 10 %   Monocytes Absolute 1.1 (H) 0.1 - 1.0 K/uL   Eosinophils Relative 0 %   Eosinophils Absolute 0.0 0.0 - 0.5 K/uL   Basophils Relative 0 %   Basophils Absolute 0.0 0.0 - 0.1 K/uL   Immature Granulocytes 0 %   Abs Immature Granulocytes 0.05 0.00 - 0.07 K/uL    Comment: Performed at [facility], [address], [place], [place] [address]  Ethanol     Status: None   Collection Time: 07/14/23  2:34  PM  Result Value Ref Range   Alcohol, Ethyl (B) <10 <10 mg/dL    Comment: (NOTE) Lowest detectable limit for serum alcohol is 10 mg/dL.  For medical purposes only. Performed at [facility], [address], [place], [place] [address]   Salicylate level     Status: Abnormal   Collection Time: 07/14/23  2:34 PM  Result Value Ref Range   Salicylate Lvl <7.0 (L) 7.0 - 30.0 mg/dL    Comment: Performed at [facility] Lab, [address] [place], [place] [address]  Acetaminophen level     Status: Abnormal   Collection Time: 07/14/23  2:34 PM  Result Value Ref Range   Acetaminophen (Tylenol), Serum <10 (L) 10 - 30 ug/mL    Comment: (NOTE) Therapeutic concentrations vary significantly. A range of 10-30 ug/mL  may be an effective concentration for many patients. However, some  are best treated at concentrations outside of this range. Acetaminophen concentrations >150 ug/mL at 4 hours after ingestion  and >50 ug/mL at 12 hours after ingestion are often associated with  toxic reactions.  Performed at [facility], [address] [place], [place] [address]   Urine rapid drug screen (hosp performed)     Status: Abnormal   Collection Time: 07/14/23  2:34 PM  Result Value Ref Range   Opiates NONE DETECTED NONE DETECTED   Cocaine NONE DETECTED NONE DETECTED   Benzodiazepines NONE DETECTED NONE DETECTED   Amphetamines NONE DETECTED NONE DETECTED  Tetrahydrocannabinol POSITIVE (A) NONE DETECTED   Barbiturates NONE DETECTED NONE DETECTED    Comment: (NOTE) DRUG SCREEN FOR MEDICAL PURPOSES ONLY.  IF CONFIRMATION IS NEEDED FOR ANY PURPOSE, NOTIFY LAB WITHIN 5 DAYS.  LOWEST DETECTABLE LIMITS FOR URINE DRUG SCREEN Drug Class                     Cutoff (ng/mL) Amphetamine and metabolites    1000 Barbiturate and metabolites    200 Benzodiazepine                 200 Opiates and metabolites        300 Cocaine and metabolites        300 THC                             50 Performed at [facility], [address], [place], [place] [address]   Comprehensive metabolic panel     Status: Abnormal   Collection Time: 07/14/23  5:24 PM  Result Value Ref Range   Sodium 137 135 - 145 mmol/L   Potassium 4.6 3.5 - 5.1 mmol/L    Comment: HEMOLYSIS AT THIS LEVEL MAY AFFECT RESULT   Chloride 104 98 - 111 mmol/L   CO2 23 22 - 32 mmol/L   Glucose, Bld 113 (H) 70 - 99 mg/dL    Comment: Glucose reference range applies only to samples taken after fasting for at least 8 hours.   BUN 14 6 - 20 mg/dL   Creatinine, Ser 2.95 0.61 - 1.24 mg/dL   Calcium 8.9 8.9 - 62.1 mg/dL   Total Protein 6.5 6.5 - 8.1 g/dL   Albumin 3.9 3.5 - 5.0 g/dL   AST 27 15 - 41 U/L    Comment: HEMOLYSIS AT THIS LEVEL MAY AFFECT RESULT   ALT 13 0 - 44 U/L    Comment: HEMOLYSIS AT THIS LEVEL MAY AFFECT RESULT   Alkaline Phosphatase 34 (L) 38 - 126 U/L   Total Bilirubin 0.6 0.3 - 1.2 mg/dL    Comment: HEMOLYSIS AT THIS LEVEL MAY AFFECT RESULT   GFR, Estimated >60 >60 mL/min    Comment: (NOTE) Calculated using the CKD-EPI Creatinine Equation (2021)    Anion gap 10 5 - 15    Comment: Performed at [facility], [address] [place], [place] [address]    Blood Alcohol level:  Lab Results  Component Value Date   [facility] <10 07/14/2023   ETH <10 01/26/2023    Physical Findings:  CIWA:    COWS:     Musculoskeletal: Strength & Muscle Tone: within normal limits Gait & Station: normal Patient leans: N/A  Psychiatric Specialty Exam:  Presentation  General Appearance:  Appropriate for Environment  Eye Contact: Fair  Speech: Pressured; Clear and Coherent  Speech Volume: Normal  Handedness: Right   Mood and Affect  Mood: Labile; Irritable  Affect: Labile; Full Range   Thought Process  Thought Processes: Disorganized  Descriptions of Associations:Tangential  Orientation:Partial  Thought Content:Paranoid Ideation; Delusions; Illogical  History of  Schizophrenia/Schizoaffective disorder:Yes  Duration of Psychotic Symptoms:Greater than six months  Hallucinations:Hallucinations: None  Ideas of Reference:Paranoia; Delusions  Suicidal Thoughts:Suicidal Thoughts: No  Homicidal Thoughts:Homicidal Thoughts: No   Sensorium  Memory: Immediate Poor; Recent Poor  Judgment: Impaired  Insight: Lacking   Executive Functions  Concentration: Poor  Attention Span: Poor  Recall: Poor  Fund of Knowledge: Poor  Language: Poor   Psychomotor Activity  Psychomotor Activity: Psychomotor Activity: Restlessness   Assets  Assets: Leisure Time; Physical Health; Social Support; Resilience   Sleep  Sleep: Sleep: Fair Number of Hours of Sleep: 0 (will not answer)    Physical Exam: Physical Exam Neurological:     Mental Status: He is alert. He is disoriented.  Psychiatric:        Attention and Perception: He is inattentive.        Mood and Affect: Affect is labile and angry.        Speech: Speech is tangential.        Behavior: Behavior is agitated.        Thought Content: Thought content is paranoid and delusional.    Review of Systems  Psychiatric/Behavioral:  Positive for hallucinations and substance abuse.        Paranoia, delusions   Blood pressure 137/79, pulse 66, temperature 97.8 F (36.6 C), temperature source Oral, resp. rate 18, height 5\' 11"  (1.803 m), weight 88 kg, SpO2 100%. Body mass index is 27.06 kg/m.   Medical Decision Making: Pt case reviewed and discussed with Dr. Lucianne Muss. Will continue to recommend IVC and inpatient psychiatric treatment.   Eligha Bridegroom, NP 07/15/2023, 12:21 PM

## 2023-07-15 NOTE — ED Notes (Signed)
Aptiom medication taken to pharmacy and to be dispensed for pt use.

## 2023-07-15 NOTE — ED Notes (Signed)
Notified EDP of red med refused. Pt verbally aggressive, yelling, and trying to get out of restraints. It was explained to pt that he has to remain calm, cooperative and refrain from trying to get out of restraints, that removing the restraints can be considered. Pt unable to verbalize understanding of instructions. Pt still agitated.

## 2023-07-15 NOTE — ED Notes (Signed)
Pt still trying to get out of restraints. Pt still verbally aggressive and kicking the bed.

## 2023-07-15 NOTE — ED Notes (Signed)
IVC  DANGEROUS to staff

## 2023-07-15 NOTE — ED Notes (Signed)
Restraints now reapplied and pt repositioned in the bed. IM meds to be given for continued aggression and behaviors.

## 2023-07-15 NOTE — Progress Notes (Signed)
Pt is biting restraints trying to get out of them

## 2023-07-15 NOTE — ED Notes (Signed)
Pt thrashing around in bed trying to bite off restraints, kicking the foot board, yelling at staff. Pt not able to comprehend why he is here, and what he needs to do to be able to get out of restraints. Pt is oriented x 3 (disoriented to situation). PRN to be given to help w/ agitation.

## 2023-07-15 NOTE — ED Notes (Signed)
IVC PAPERWORK DONE AND COMPLETED ATTACHED TO THE CLIPBOARD IN BLUE ZONE  SECRETARY CEIRA  IS AWARE.  PAPERWORK EXPIRES 10/28 ORIGINAL PAPERWORK IN THE RED FOLDER

## 2023-07-15 NOTE — ED Notes (Signed)
Pt broke out of R leg restraint in same way as the left leg restraint. New restraint placed on pt.

## 2023-07-15 NOTE — ED Notes (Signed)
Updated pt's mother on pt status per pt request.

## 2023-07-15 NOTE — ED Notes (Signed)
IVC  DANGEROUS to staff and others  Restraints

## 2023-07-15 NOTE — Progress Notes (Signed)
Pt continue to try to get out restraints and threaten staff nurse aware

## 2023-07-15 NOTE — ED Notes (Signed)
Pt kicked footboard and broke the L leg restraint. Called EDP and obtained verbal order for 1 time b52

## 2023-07-16 ENCOUNTER — Encounter (HOSPITAL_COMMUNITY): Payer: Self-pay | Admitting: Psychiatry

## 2023-07-16 ENCOUNTER — Other Ambulatory Visit: Payer: Self-pay

## 2023-07-16 ENCOUNTER — Inpatient Hospital Stay (HOSPITAL_COMMUNITY)
Admission: AD | Admit: 2023-07-16 | Discharge: 2023-07-23 | DRG: 885 | Disposition: A | Discharge status: Home / Self Care | Payer: MEDICAID | Source: Intra-hospital | Attending: Psychiatry | Admitting: Psychiatry

## 2023-07-16 DIAGNOSIS — F411 Generalized anxiety disorder: Secondary | ICD-10-CM | POA: Diagnosis present

## 2023-07-16 DIAGNOSIS — Z79899 Other long term (current) drug therapy: Secondary | ICD-10-CM | POA: Diagnosis not present

## 2023-07-16 DIAGNOSIS — F17290 Nicotine dependence, other tobacco product, uncomplicated: Secondary | ICD-10-CM | POA: Diagnosis present

## 2023-07-16 DIAGNOSIS — E7220 Disorder of urea cycle metabolism, unspecified: Secondary | ICD-10-CM | POA: Diagnosis present

## 2023-07-16 DIAGNOSIS — Z885 Allergy status to narcotic agent status: Secondary | ICD-10-CM

## 2023-07-16 DIAGNOSIS — Z91148 Patient's other noncompliance with medication regimen for other reason: Secondary | ICD-10-CM

## 2023-07-16 DIAGNOSIS — G40909 Epilepsy, unspecified, not intractable, without status epilepticus: Secondary | ICD-10-CM | POA: Diagnosis present

## 2023-07-16 DIAGNOSIS — Z5941 Food insecurity: Secondary | ICD-10-CM

## 2023-07-16 DIAGNOSIS — F312 Bipolar disorder, current episode manic severe with psychotic features: Secondary | ICD-10-CM | POA: Diagnosis present

## 2023-07-16 DIAGNOSIS — G4700 Insomnia, unspecified: Secondary | ICD-10-CM | POA: Diagnosis present

## 2023-07-16 DIAGNOSIS — M419 Scoliosis, unspecified: Secondary | ICD-10-CM | POA: Diagnosis present

## 2023-07-16 DIAGNOSIS — F1220 Cannabis dependence, uncomplicated: Secondary | ICD-10-CM | POA: Diagnosis present

## 2023-07-16 DIAGNOSIS — Z888 Allergy status to other drugs, medicaments and biological substances status: Secondary | ICD-10-CM | POA: Diagnosis not present

## 2023-07-16 DIAGNOSIS — F909 Attention-deficit hyperactivity disorder, unspecified type: Secondary | ICD-10-CM | POA: Diagnosis present

## 2023-07-16 DIAGNOSIS — F29 Unspecified psychosis not due to a substance or known physiological condition: Secondary | ICD-10-CM | POA: Diagnosis not present

## 2023-07-16 MED ORDER — DIPHENHYDRAMINE HCL 25 MG PO CAPS: 50.0000 mg | ORAL_CAPSULE | Freq: Three times a day (TID) | ORAL | Status: DC | PRN

## 2023-07-16 MED ORDER — LORAZEPAM 2 MG/ML IJ SOLN: 2.0000 mg | Freq: Three times a day (TID) | INTRAMUSCULAR | Status: DC | PRN

## 2023-07-16 MED ORDER — ESLICARBAZEPINE ACETATE 800 MG PO TABS
2.0000 | ORAL_TABLET | Freq: Every day | ORAL | Status: DC
  Administered 2023-07-17 – 2023-07-23 (×7): 2 via ORAL

## 2023-07-16 MED ORDER — LORATADINE 10 MG PO TABS
10.0000 mg | ORAL_TABLET | Freq: Every day | ORAL | Status: DC
  Administered 2023-07-17 – 2023-07-23 (×7): 10 mg via ORAL
  Filled 2023-07-16 (×9): qty 1

## 2023-07-16 MED ORDER — LEVOCARNITINE 1 GM/10ML PO SOLN
1000.0000 mg | Freq: Two times a day (BID) | ORAL | Status: DC
  Administered 2023-07-16 – 2023-07-23 (×14): 1000 mg via ORAL
  Filled 2023-07-16 (×19): qty 10

## 2023-07-16 MED ORDER — TRAZODONE HCL 50 MG PO TABS
50.0000 mg | ORAL_TABLET | Freq: Every evening | ORAL | Status: DC | PRN
  Administered 2023-07-16 – 2023-07-22 (×7): 50 mg via ORAL
  Filled 2023-07-16 (×6): qty 1

## 2023-07-16 MED ORDER — CARVEDILOL 12.5 MG PO TABS
25.0000 mg | ORAL_TABLET | Freq: Two times a day (BID) | ORAL | Status: DC
  Administered 2023-07-17 – 2023-07-23 (×13): 25 mg via ORAL
  Filled 2023-07-16 (×8): qty 2
  Filled 2023-07-16 (×2): qty 1
  Filled 2023-07-16 (×4): qty 2
  Filled 2023-07-16: qty 1
  Filled 2023-07-16 (×3): qty 2

## 2023-07-16 MED ORDER — MAGNESIUM HYDROXIDE 400 MG/5ML PO SUSP: 30.0000 mL | Freq: Every day | ORAL | Status: DC | PRN

## 2023-07-16 MED ORDER — HALOPERIDOL 5 MG PO TABS: 5.0000 mg | ORAL_TABLET | Freq: Three times a day (TID) | ORAL | Status: DC | PRN

## 2023-07-16 MED ORDER — ACETAMINOPHEN 325 MG PO TABS: 650.0000 mg | ORAL_TABLET | Freq: Four times a day (QID) | ORAL | Status: DC | PRN

## 2023-07-16 MED ORDER — DIVALPROEX SODIUM 500 MG PO DR TAB
500.0000 mg | DELAYED_RELEASE_TABLET | Freq: Two times a day (BID) | ORAL | Status: DC
  Administered 2023-07-16 – 2023-07-18 (×4): 500 mg via ORAL
  Filled 2023-07-16 (×9): qty 1

## 2023-07-16 MED ORDER — ALUM & MAG HYDROXIDE-SIMETH 200-200-20 MG/5ML PO SUSP: 30.0000 mL | ORAL | Status: DC | PRN

## 2023-07-16 MED ORDER — LORAZEPAM 1 MG PO TABS: 2.0000 mg | ORAL_TABLET | Freq: Three times a day (TID) | ORAL | Status: DC | PRN

## 2023-07-16 MED ORDER — NICOTINE 14 MG/24HR TD PT24
14.0000 mg | MEDICATED_PATCH | Freq: Every day | TRANSDERMAL | Status: DC
  Administered 2023-07-17: 14 mg via TRANSDERMAL
  Filled 2023-07-16 (×3): qty 1

## 2023-07-16 MED ORDER — HALOPERIDOL LACTATE 5 MG/ML IJ SOLN: 5.0000 mg | Freq: Three times a day (TID) | INTRAMUSCULAR | Status: DC | PRN

## 2023-07-16 MED ORDER — DIPHENHYDRAMINE HCL 50 MG/ML IJ SOLN: 50.0000 mg | Freq: Three times a day (TID) | INTRAMUSCULAR | Status: DC | PRN

## 2023-07-16 MED ORDER — ZONISAMIDE 100 MG PO CAPS
300.0000 mg | ORAL_CAPSULE | Freq: Every day | ORAL | Status: DC
  Administered 2023-07-17 – 2023-07-23 (×7): 300 mg via ORAL
  Filled 2023-07-16 (×10): qty 3

## 2023-07-16 NOTE — ED Notes (Signed)
Updated pt's mom.

## 2023-07-16 NOTE — ED Notes (Signed)
Pt ambulatory to nurse's station with strong and steady gait to use phone. Pt calm and cooperative at this time.

## 2023-07-16 NOTE — Group Note (Signed)
Date:  07/16/2023 Time:  9:11 PM  Group Topic/Focus:  Wrap-Up Group:   The focus of this group is to help patients review their daily goal of treatment and discuss progress on daily workbooks.    Participation Level:  Did Not Attend   Scot Dock 07/16/2023, 9:11 PM

## 2023-07-16 NOTE — ED Provider Notes (Signed)
Emergency Medicine Observation Re-evaluation Note  Dylan Moon is a 27 y.o. male, seen on rounds today.  Pt initially presented to the ED for complaints of ivc, ?compliance with outpatient meds, and acute psychosis/agitated behavior. Currently resting calmly.   Physical Exam  BP 129/80 (BP Location: Right Arm)   Pulse 73   Temp 97.8 F (36.6 C) (Oral)   Resp 18   Ht 1.803 m (5\' 11" )   Wt 88 kg   SpO2 99%   BMI 27.06 kg/m  Physical Exam General: resting, nad.  Cardiac: regular rate Lungs: breathing comfortably. Psych: calm, resting.   ED Course / MDM    I have reviewed the labs performed to date as well as medications administered while in observation.  Recent changes in the last 24 hours include ED obs, reassessment.   Plan  [facility] team is following and today's reassessment is pending. Med management and disposition per [facility] team.     Cathren Laine, MD 07/16/23 [phone]

## 2023-07-16 NOTE — ED Notes (Signed)
Pt making statements about leaving ED. Will continue to monitor.

## 2023-07-16 NOTE — ED Notes (Signed)
Holding PO meds at this time until pt wakes up to prevent behaviors
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
## 2023-07-16 NOTE — ED Notes (Signed)
Pt a little irritable. Will continue to monitor closely for any behaviors that escalate

## 2023-07-16 NOTE — ED Notes (Signed)
Pt currently awake, calm, cooperative and pleasant. Pt took meds whole w/ water and tolerated well. Will continue to monitor pt behavior closely for any signs of escalation. Security in purple zone.  Explained to pt that after lunch staff will get an EKG and then he can use the phone as long as his behavior remains calm.

## 2023-07-16 NOTE — ED Notes (Signed)
Pt called mom asking her to pick him up- Pt stating "I'm a grown ass man." and "I am going to walk home."  Pt calmly walked back to room at this time.
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
## 2023-07-16 NOTE — Tx Team (Signed)
Initial Treatment Plan 07/16/2023 10:14 PM [name] RUE:[id number]    PATIENT STRESSORS: Legal issue   Medication change or noncompliance   Substance abuse     PATIENT STRENGTHS: General fund of knowledge  Motivation for treatment/growth  Supportive family/friends    PATIENT IDENTIFIED PROBLEMS: Paranoia / Psychosis  Aggression / anger  "Get my mind right, working on my self"                 DISCHARGE CRITERIA:  Improved stabilization in mood, thinking, and/or behavior Verbal commitment to aftercare and medication compliance  PRELIMINARY DISCHARGE PLAN: Attend aftercare/continuing care group Attend PHP/IOP  PATIENT/FAMILY INVOLVEMENT: This treatment plan has been presented to and reviewed with the patient, Dylan Moon.  The patient and family have been given the opportunity to ask questions and make suggestions.  Delos Haring, RN 07/16/2023, 10:14 PM

## 2023-07-16 NOTE — Progress Notes (Addendum)
Pt has been accepted to [facility] on 07/16/2023 Pending DC Bed assignment: 508-1   Pt meets inpatient criteria per: Gara Kroner NP  Attending Physician will be: Phineas Inches, MD   Report can be called to: Adult unit: [phone]  Pt can arrive after: pending discharges  Care Team Notified: [facility] Rona Ravens RN, Florentina Addison RN, [place] Ar RN, and [place] Hendra LCSW  [place] Daria Mcmeekin LCSW-A   07/16/2023 2:59 PM

## 2023-07-17 DIAGNOSIS — F312 Bipolar disorder, current episode manic severe with psychotic features: Secondary | ICD-10-CM | POA: Diagnosis not present

## 2023-07-17 LAB — LIPID PANEL
Cholesterol: 199 mg/dL (ref 0–200)
HDL: 59 mg/dL (ref >40)
LDL Cholesterol: 101 mg/dL — ABNORMAL HIGH (ref 0–99)
Total CHOL/HDL Ratio: 3.4 {ratio}
Triglycerides: 193 mg/dL — ABNORMAL HIGH (ref <150)
VLDL: 39 mg/dL (ref 0–40)

## 2023-07-17 LAB — HEMOGLOBIN A1C
Hgb A1c MFr Bld: 5.6 % (ref 4.8–5.6)
Mean Plasma Glucose: 114.02 mg/dL

## 2023-07-17 MED ORDER — PALIPERIDONE ER 3 MG PO TB24
3.0000 mg | ORAL_TABLET | Freq: Two times a day (BID) | ORAL | Status: DC
  Administered 2023-07-17 – 2023-07-23 (×12): 3 mg via ORAL
  Filled 2023-07-17 (×17): qty 1

## 2023-07-17 MED ORDER — NICOTINE 14 MG/24HR TD PT24
14.0000 mg | MEDICATED_PATCH | Freq: Every day | TRANSDERMAL | Status: DC
  Filled 2023-07-17 (×8): qty 1

## 2023-07-17 MED ORDER — LORAZEPAM 2 MG/ML IJ SOLN: 2.0000 mg | Freq: Every day | INTRAMUSCULAR | Status: DC | PRN

## 2023-07-17 NOTE — Plan of Care (Signed)

## 2023-07-17 NOTE — [facility] Group Notes (Signed)
[facility] Group Notes:  (Nursing/MHT/Case Management/Adjunct)  Date:  07/17/2023  Time:  9:56 AM  Type of Therapy:  Psychoeducational Skills  Participation Level:  Active  Participation Quality:  Appropriate and Attentive  Affect:  Appropriate  Cognitive:  Alert and Appropriate  Insight:  Appropriate and Good  Engagement in Group:  Engaged  Modes of Intervention:  Orientation  Summary of Progress/Problems: Discussed goals and expectation for the day.  Stated goal is "to be better than yesterday and to take my medication."  Dylan Moon 07/17/2023, 9:56 AM

## 2023-07-17 NOTE — Plan of Care (Signed)
  Problem: Education: Goal: Emotional status will improve Outcome: Progressing Goal: Mental status will improve Outcome: Progressing   Problem: Activity: Goal: Interest or engagement in activities will improve Outcome: Progressing Goal: Sleeping patterns will improve Outcome: Progressing   Problem: Safety: Goal: Periods of time without injury will increase Outcome: Progressing   

## 2023-07-17 NOTE — Progress Notes (Signed)
Pt continues to want to leave, but pt understands he has to go through his Tx , pt had no inappropriate behaviors on the unit this evening    07/17/23 2145  Psych Admission Type (Psych Patients Only)  Admission Status Involuntary  Psychosocial Assessment  Patient Complaints Anxiety;Worrying  Eye Contact Fair  Facial Expression Sad;Anxious  Affect Anxious;Preoccupied;Labile  Speech Slow  Interaction Assertive  Motor Activity Slow  Appearance/Hygiene In scrubs  Behavior Characteristics Cooperative  Mood Anxious  Aggressive Behavior  Effect No apparent injury  Thought Process  Coherency Circumstantial;Disorganized;Concrete thinking  Content Blaming others  Delusions Paranoid  Perception Hallucinations  Hallucination Auditory  Judgment Impaired  Confusion Mild  Danger to Self  Current suicidal ideation? Denies
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
## 2023-07-17 NOTE — Group Note (Signed)
Date:  07/17/2023 Time:  8:51 PM  Group Topic/Focus:  Wrap-Up Group:   The focus of this group is to help patients review their daily goal of treatment and discuss progress on daily workbooks.    Participation Level:  Active  Participation Quality:  Appropriate  Affect:  Appropriate  Cognitive:  Appropriate  Insight: Appropriate  Engagement in Group:  Engaged  Modes of Intervention:  Education and Exploration  Additional Comments:  Patient attended and participated in group tonight. He reports that his strength is that he likes to keep others happy. His support system is week.  Lita Mains [facility] 07/17/2023, 8:51 PM

## 2023-07-17 NOTE — Progress Notes (Signed)
Pt has been anxious today and preoccupied about leaving hospital to attend his grandmothers funeral. Pt compliant with redirection and is medication compliant. Pts mother spoke with nurse and stated pts grandmother has been deceased and there is no funeral for her today. Q 15 minute checks ongoing.

## 2023-07-17 NOTE — Plan of Care (Signed)
  Problem: Activity: Goal: Interest or engagement in activities will improve Outcome: Progressing Goal: Sleeping patterns will improve Outcome: Progressing   

## 2023-07-18 ENCOUNTER — Encounter (HOSPITAL_COMMUNITY): Payer: Self-pay

## 2023-07-18 DIAGNOSIS — F312 Bipolar disorder, current episode manic severe with psychotic features: Secondary | ICD-10-CM | POA: Diagnosis not present

## 2023-07-18 LAB — AMMONIA: Ammonia: 70 umol/L — ABNORMAL HIGH (ref 9–35)

## 2023-07-18 LAB — TSH: TSH: 2.536 u[IU]/mL (ref 0.350–4.500)

## 2023-07-18 LAB — VALPROIC ACID LEVEL: Valproic Acid Lvl: 70 ug/mL (ref 50.0–100.0)

## 2023-07-18 LAB — VITAMIN D 25 HYDROXY (VIT D DEFICIENCY, FRACTURES): Vit D, 25-Hydroxy: 28.82 ng/mL — ABNORMAL LOW (ref 30–100)

## 2023-07-18 MED ORDER — DIVALPROEX SODIUM 500 MG PO DR TAB
500.0000 mg | DELAYED_RELEASE_TABLET | Freq: Every day | ORAL | Status: DC
  Administered 2023-07-18 – 2023-07-22 (×5): 500 mg via ORAL
  Filled 2023-07-18 (×7): qty 1

## 2023-07-18 MED ORDER — DIVALPROEX SODIUM 250 MG PO DR TAB
250.0000 mg | DELAYED_RELEASE_TABLET | Freq: Every day | ORAL | Status: DC
  Administered 2023-07-19 – 2023-07-23 (×5): 250 mg via ORAL
  Filled 2023-07-18 (×7): qty 1

## 2023-07-18 MED ORDER — LACTULOSE 10 GM/15ML PO SOLN
10.0000 g | Freq: Two times a day (BID) | ORAL | Status: DC
  Administered 2023-07-18 – 2023-07-20 (×4): 10 g via ORAL
  Filled 2023-07-18 (×7): qty 15

## 2023-07-18 MED ORDER — DIVALPROEX SODIUM 500 MG PO DR TAB: 500.0000 mg | DELAYED_RELEASE_TABLET | Freq: Every day | ORAL | Status: DC

## 2023-07-18 NOTE — Group Note (Signed)
Recreation Therapy Group Note   Group Topic:Team Building  Group Date: 07/18/2023 Start Time: 1007 End Time: 1032 Facilitators: Belle Charlie-McCall, LRT,CTRS Location: [address] [employer]   Group Topic: Team Building, Problem Solving  Goal Area(s) Addresses:  Patient will effectively work with peer towards shared goal.  Patient will identify skills used to make activity successful.  Patient will identify how skills used during activity can be applied to reach post d/c goals.   Intervention: STEM Activity- Glass blower/designer  Group Description: Tallest Pharmacist, community. In teams of 5-6, patients were given 11 craft pipe cleaners. Using the materials provided, patients were instructed to compete again the opposing team(s) to build the tallest free-standing structure from floor level. The activity was timed; difficulty increased by Clinical research associate as Production designer, theatre/television/film continued.  Systematically resources were removed with additional directions for example, placing one arm behind their back, working in silence, and shape stipulations. LRT facilitated post-activity discussion reviewing team processes and necessary communication skills involved in completion. Patients were encouraged to reflect how the skills utilized, or not utilized, in this activity can be incorporated to positively impact support systems post discharge.  Education: Pharmacist, community, Scientist, physiological, Discharge Planning   Education Outcome: Acknowledges education/In group clarification offered/Needs additional education.    Affect/Mood: Drowsy   Participation Level: Active   Participation Quality: Independent   Behavior: Attentive  and Cooperative   Speech/Thought Process: Focused   Insight: Moderate   Judgement: Moderate   Modes of Intervention: STEM Activity   Patient Response to Interventions:  Receptive   Education Outcome:  In group clarification offered    Clinical Observations/Individualized Feedback: Pt  had little to no help from peers in completing the activity. Pt was drowsy but still worked to construct a tower. Pt was appropriate and active throughout session.      Plan: Continue to engage patient in RT group sessions 2-3x/week.   Sadeel Fiddler-McCall, LRT,CTRS 07/18/2023 12:41 PM

## 2023-07-18 NOTE — [facility] Group Notes (Signed)
[facility] Group Notes:  (Nursing/MHT/Case Management/Adjunct)    Adult Psychoeducational Group Note  Date:  07/18/2023 Time:  10:41 AM  Group Topic/Focus:  Goals Group:   The focus of this group is to help patients establish daily goals to achieve during treatment and discuss how the patient can incorporate goal setting into their daily lives to aide in recovery. Orientation:   The focus of this group is to educate the patient on the purpose and policies of crisis stabilization and provide a format to answer questions about their admission.  The group details unit policies and expectations of patients while admitted.  Participation Level:  Did Not Attend  Additional Comments:  Did not attend.  Lucilla Edin 07/18/2023, 10:41 AM

## 2023-07-18 NOTE — Progress Notes (Cosign Needed Addendum)
[facility] MD Progress Note  07/18/2023 4:07 PM Dylan Moon  MRN:  [id number]  HPI: Dylan Moon is a 27 year old African-American male with prior mental health history of schizoaffective disorder, bipolar type, PTSD, ADHD, GAD & medical diagnosis of seizure activity who presented to the [facility] behavioral health urgent care [facility]) accompanied by law enforcement, after being involuntarily committed by his mother for aggressive behaviors such as walking up to random people and accusing him of threatening to kill him, asking law enforcement to shoot people etc. patient's last hospitalization at this [facility] was on 01/28/2023.  He was transferred back to this hospital for treatment and stabilization of his mental status.   Patient assessment note: Vital signs are within normal limits.  Overnight RN noted that patient was preoccupied, disorganized, circumstantial, and blaming others.  As per nursing reports, patient has not presented any behavioral concerns on the unit, he has been appropriate, is compliant with medications, and slept through the night with no issues.  During today's assessment, patient is able to sit through entire assessment, concentration is good, but he continues to have some lingering paranoia, but to a lesser extent as compared to yesterday.  He states that he saw somebody outside of his home prior to this hospitalization, loading up his car, and he thought that the person was out to get him, but is currently unsure if that is the case.  He remains grandiose, and talks about being a Environmental manager, and talks about how he knows a lot of music execs, etc. he denies SI/HI/AVH.  He is cooperative with unit rules and protocols, he reports that he is sleeping through the night with no concerns, denies being in any physical pain today, denies any issues with his bowel movements, states that he had one today. No TD/EPS type symptoms found on assessment, and pt denies any feelings of  stiffness. AIMS: 0.   As per patient's mother, patient got the long-acting injectable medication Invega Sustenna 156 mg on 07/06/2023.  Patient's mother was also asked yesterday to bring one of his antiepileptic medications to the hospital that we do not seem to carry, and states that she dropped all of his meds here earlier today. Mother is asking for CSW to make contact with pt's ACT team and have them see patient here prior to him discharging. Info will be relayed to CSW.   We are continuing medications as listed below.  Changing morning dose of Depakote ER to 250 mg due to a slightly elevated ammonia level at 70.  Continuing all other medications with no changes as noted below.  Symptoms are improving on the Invega 3 mg twice daily p.o. We will continue to revisit discharge planning on a daily basis.  Tentative discharge date for now is 10/28, if all follow-up appointments are in place, and psychosis continues to resolve.  Principal Problem: Bipolar affective disorder, current episode manic with psychotic symptoms (HCC) Diagnosis: Principal Problem:   Bipolar affective disorder, current episode manic with psychotic symptoms (HCC)  Total Time spent with patient: 45 minutes  Past Psychiatric History: See H & P  Past Medical History:  Past Medical History:  Diagnosis Date   ADHD    Bipolar 1 disorder (HCC)    Schizoaffective disorder (HCC)    Scoliosis    Seizures (HCC)    most recent 12/02/17    Past Surgical History:  Procedure Laterality Date   NO PAST SURGERIES     Family History:  Family History  Problem Relation Age of Onset   Diabetes Mother    Hypertension Mother    Cancer Other    Diabetes Father    Seizures Maternal Grandfather    Family Psychiatric  History: See H & P Social History:  Social History   Substance and Sexual Activity  Alcohol Use Not Currently     Social History   Substance and Sexual Activity  Drug Use Yes   Types: Marijuana   Comment: Daily  use of marijuana.    Social History   Socioeconomic History   Marital status: Single    Spouse name: Not on file   Number of children: 0   Years of education: HS   Highest education level: Not on file  Occupational History   Occupation: Product manager of videos  Tobacco Use   Smoking status: Some Days    Types: Cigars   Smokeless tobacco: Never  Vaping Use   Vaping status: Never Used  Substance and Sexual Activity   Alcohol use: Not Currently   Drug use: Yes    Types: Marijuana    Comment: Daily use of marijuana.   Sexual activity: Not Currently  Other Topics Concern   Not on file  Social History Narrative   Lives at home his mother.   3-4 sodas per week.   Right-handed.   Social Determinants of Health   Financial Resource Strain: Not on file  Food Insecurity: Food Insecurity Present (07/16/2023)   Hunger Vital Sign    Worried About Running Out of Food in the Last Year: Often true    Ran Out of Food in the Last Year: Sometimes true  Transportation Needs: No Transportation Needs (07/16/2023)   PRAPARE - Administrator, Civil Service (Medical): No    Lack of Transportation (Non-Medical): No  Physical Activity: Not on file  Stress: Not on file  Social Connections: Not on file   Sleep: Good  Appetite:  Good  Current Medications: Current Facility-Administered Medications  Medication Dose Route Frequency Provider Last Rate Last Admin   acetaminophen (TYLENOL) tablet 650 mg  650 mg Oral Q6H PRN Lenox Ponds, NP       alum & mag hydroxide-simeth (MAALOX/MYLANTA) 200-200-20 MG/5ML suspension 30 mL  30 mL Oral Q4H PRN Lenox Ponds, NP       carvedilol (COREG) tablet 25 mg  25 mg Oral BID WC Lenox Ponds, NP   25 mg at 07/18/23 1610   diphenhydrAMINE (BENADRYL) capsule 50 mg  50 mg Oral TID PRN Lenox Ponds, NP       Or   diphenhydrAMINE (BENADRYL) injection 50 mg  50 mg Intramuscular TID PRN Lenox Ponds, NP       Melene Muller ON  07/19/2023] divalproex (DEPAKOTE) DR tablet 250 mg  250 mg Oral Q1400 Fidencia Mccloud, NP       divalproex (DEPAKOTE) DR tablet 500 mg  500 mg Oral QHS Kelda Azad, NP       Eslicarbazepine Acetate TABS 2 tablet  2 tablet Oral Daily Lenox Ponds, NP   2 tablet at 07/18/23 0827   haloperidol (HALDOL) tablet 5 mg  5 mg Oral TID PRN Lenox Ponds, NP       Or   haloperidol lactate (HALDOL) injection 5 mg  5 mg Intramuscular TID PRN Lenox Ponds, NP       lactulose (CHRONULAC) 10 GM/15ML solution 10 g  10 g Oral BID [name], NP  levOCARNitine (CARNITOR) 1 GM/10ML solution 1,000 mg  1,000 mg Oral BID Lenox Ponds, NP   1,000 mg at 07/18/23 8756   loratadine (CLARITIN) tablet 10 mg  10 mg Oral Daily Lenox Ponds, NP   10 mg at 07/18/23 4332   LORazepam (ATIVAN) tablet 2 mg  2 mg Oral TID PRN Lenox Ponds, NP       Or   LORazepam (ATIVAN) injection 2 mg  2 mg Intramuscular TID PRN Lenox Ponds, NP       LORazepam (ATIVAN) injection 2 mg  2 mg Intramuscular Daily PRN [name], NP       magnesium hydroxide (MILK OF MAGNESIA) suspension 30 mL  30 mL Oral Daily PRN Lenox Ponds, NP       nicotine (NICODERM CQ - dosed in mg/24 hours) patch 14 mg  14 mg Transdermal Daily Sindy Guadeloupe, NP       paliperidone (INVEGA) 24 hr tablet 3 mg  3 mg Oral BID [name], NP   3 mg at 07/18/23 9518   traZODone (DESYREL) tablet 50 mg  50 mg Oral QHS PRN Lenox Ponds, NP   50 mg at 07/17/23 2036   zonisamide (ZONEGRAN) capsule 300 mg  300 mg Oral Daily Lenox Ponds, NP   300 mg at 07/18/23 8416    Lab Results:  Results for orders placed or performed during the hospital encounter of 07/16/23 (from the past 48 hour(s))  Hemoglobin A1c     Status: None   Collection Time: 07/17/23  6:25 AM  Result Value Ref Range   Hgb A1c MFr Bld 5.6 4.8 - 5.6 %    Comment: (NOTE) Pre diabetes:          5.7%-6.4%  Diabetes:              >6.4%  Glycemic control for    <7.0% adults with diabetes    Mean Plasma Glucose 114.02 mg/dL    Comment: Performed at [facility], [address] [place], [place] [address]  Lipid panel     Status: Abnormal   Collection Time: 07/17/23  6:25 AM  Result Value Ref Range   Cholesterol 199 0 - 200 mg/dL   Triglycerides 160 (H) <150 mg/dL   HDL 59 >10 mg/dL   Total CHOL/HDL Ratio 3.4 RATIO   VLDL 39 0 - 40 mg/dL   LDL Cholesterol 932 (H) 0 - 99 mg/dL    Comment:        Total Cholesterol/HDL:CHD Risk Coronary Heart Disease Risk Table                     Men   Women  1/2 Average Risk   3.4   3.3  Average Risk       5.0   4.4  2 X Average Risk   9.6   7.1  3 X Average Risk  23.4   11.0        Use the calculated Patient Ratio above and the CHD Risk Table to determine the patient's CHD Risk.        ATP III CLASSIFICATION (LDL):  <100     mg/dL   Optimal  355-732  mg/dL   Near or Above                    Optimal  130-159  mg/dL   Borderline  202-542  mg/dL   High  >  190     mg/dL   Very High Performed at [facility], [address], [place], [place] [address]   Valproic acid level     Status: None   Collection Time: 07/18/23  6:42 AM  Result Value Ref Range   Valproic Acid Lvl 70 50.0 - 100.0 ug/mL    Comment: Performed at [facility], [address] [place], [place] [address]  Ammonia     Status: Abnormal   Collection Time: 07/18/23  6:42 AM  Result Value Ref Range   Ammonia 70 (H) 9 - 35 umol/L    Comment: Performed at [facility], [address] [place], [place] [address]  TSH     Status: None   Collection Time: 07/18/23  6:42 AM  Result Value Ref Range   TSH 2.536 0.350 - 4.500 uIU/mL    Comment: Performed by a 3rd Generation assay with a functional sensitivity of <=0.01 uIU/mL. Performed at [facility], [address] [place], [place] [address]   VITAMIN D 25 Hydroxy (Vit-D Deficiency, Fractures)     Status:  Abnormal   Collection Time: 07/18/23  6:42 AM  Result Value Ref Range   Vit D, 25-Hydroxy 28.82 (L) 30 - 100 ng/mL    Comment: (NOTE) Vitamin D deficiency has been defined by the Institute of Medicine  and an Endocrine Society practice guideline as a level of serum 25-OH  vitamin D less than 20 ng/mL (1,2). The Endocrine Society went on to  further define vitamin D insufficiency as a level between 21 and 29  ng/mL (2).  1. IOM (Institute of Medicine). 2010. Dietary reference intakes for  calcium and D. [place]: [employer]. 2. Holick MF, Binkley [place], Bischoff-Ferrari HA, et al. Evaluation,  treatment, and prevention of vitamin D deficiency: an Endocrine  Society clinical practice guideline, JCEM. 2011 Jul; 96(7): 1911-30.  Performed at [facility], [address] [place], [place] [address]     Blood Alcohol level:  Lab Results  Component Value Date   [facility] <10 07/14/2023   ETH <10 01/26/2023    Metabolic Disorder Labs: Lab Results  Component Value Date   HGBA1C 5.6 07/17/2023   MPG 114.02 07/17/2023   MPG 111.15 01/30/2023   Lab Results  Component Value Date   PROLACTIN 40.3 (H) 12/06/2022   Lab Results  Component Value Date   CHOL 199 07/17/2023   TRIG 193 (H) 07/17/2023   HDL 59 07/17/2023   CHOLHDL 3.4 07/17/2023   VLDL 39 07/17/2023   LDLCALC 101 (H) 07/17/2023   LDLCALC 91 01/30/2023    Physical Findings: AIMS:  , ,  ,  ,    CIWA:    COWS:     Musculoskeletal: Strength & Muscle Tone: within normal limits Gait & Station: normal Patient leans: N/A  Psychiatric Specialty Exam:  Presentation  General Appearance:  Appropriate for Environment; Fairly Groomed  Eye Contact: Fair  Speech: Clear and Coherent  Speech Volume: Normal  Handedness: Right   Mood and Affect  Mood: Anxious  Affect: Congruent   Thought Process  Thought Processes: Coherent  Descriptions of Associations:Intact  Orientation:Full  (Time, Place and Person)  Thought Content:Illogical  History of Schizophrenia/Schizoaffective disorder:Yes  Duration of Psychotic Symptoms:Greater than six months  Hallucinations:Hallucinations: None  Ideas of Reference:None; Percusatory; Delusions  Suicidal Thoughts:Suicidal Thoughts: No  Homicidal Thoughts:Homicidal Thoughts: No   Sensorium  Memory: Recent Poor  Judgment: Intact  Insight: Fair   Chartered certified accountant: Good  Attention Span: Good  Recall: Poor  Fund of Knowledge: Poor  Language: Fair   Lexicographer Activity: Psychomotor Activity: Normal   Assets  Assets: Resilience; Housing; Social Support   Sleep  Sleep: Sleep: Good    Physical Exam: Physical Exam Vitals and nursing note reviewed.  Constitutional:      Appearance: Normal appearance.  HENT:     Head: Normocephalic.     Nose: Nose normal.  Eyes:     Pupils: Pupils are equal, round, and reactive to light.  Musculoskeletal:        General: Normal range of motion.     Cervical back: Normal range of motion.  Neurological:     General: No focal deficit present.     Mental Status: He is alert and oriented to person, place, and time.  Psychiatric:        Thought Content: Thought content normal.    Review of Systems  HENT: Negative.    Respiratory: Negative.    Cardiovascular:  Negative for chest pain.  Gastrointestinal: Negative.   Genitourinary: Negative.   Musculoskeletal: Negative.   Skin: Negative.   Neurological: Negative.   Psychiatric/Behavioral:  Positive for substance abuse. Negative for depression, hallucinations, memory loss and suicidal ideas. The patient is nervous/anxious and has insomnia.   All other systems reviewed and are negative.  Blood pressure 111/86, pulse 79, temperature 98.3 F (36.8 C), temperature source Oral, resp. rate 20, height 5\' 11"  (1.803 m), weight 87.1 kg, SpO2 98%. Body mass index is 26.78  kg/m.  Treatment Plan Summary: Daily contact with patient to assess and evaluate symptoms and progress in treatment and Medication management   Observation Level/Precautions:  15 minute checks  Laboratory:  Labs reviewed   Psychotherapy:  Unit Group sessions  Medications:  See [facility]  Consultations:  To be determined   Discharge Concerns:  Safety, medication compliance, mood stability  Estimated LOS: 5-7 days  Other:  N/A      PLAN Safety and Monitoring: Voluntary admission to inpatient psychiatric unit for safety, stabilization and treatment Daily contact with patient to assess and evaluate symptoms and progress in treatment Patient's case to be discussed in multi-disciplinary team meeting Observation Level : q15 minute checks Vital signs: q12 hours Precautions: Safety   Long Term Goal(s): Improvement in symptoms so as ready for discharge   Short Term Goals: Ability to identify changes in lifestyle to reduce recurrence of condition will improve, Ability to verbalize feelings will improve, Ability to demonstrate self-control will improve, Ability to identify and develop effective coping behaviors will improve, Ability to maintain clinical measurements within normal limits will improve, Compliance with prescribed medications will improve, and Ability to identify triggers associated with substance abuse/mental health issues will improve   Diagnoses:  Principal Problem:   Bipolar I disorder, severe, current or most recent episode manic, with psychotic features (HCC) Active Problems:   Seizures (HCC)   Delta-9-tetrahydrocannabinol (THC) dependence (HCC)   GAD (generalized anxiety disorder)   Insomnia   Medications -Continue Ativan 2 mg IM daily as needed for status epilepticus -Continue Invega 3 mg BID -Decrease AM dose of Depakote to 250 mg in the mornings & PM dose at 500 mg due to ammonia level being elevated slightly at 70 -Start Lactulose 30 cc BID for elevated ammonia  levels -Continue Carvedilol 25 mg for htn (home med) -Continue Eslicarbazepine Acetate 1,600mg  daily  -Continue Zonegran 300 mg for seizures (  home medication) -Continue trazodone 50 mg nightly as needed for sleep -Continue Claritin 10 mg daily for seasonal allergic rhinitis -Continue agitation protocol medications: Benadryl/Ativan/Haldol as per the MAR   Other PRNS -Continue Tylenol 650 mg every 6 hours PRN for mild pain -Continue Maalox 30 mg every 4 hrs PRN for indigestion -Continue Milk of Magnesia as needed every 6 hrs for constipation   Labs reviewed: Ammonia levels are slightly elevated at 70, a.m. dose of Depakote reduced to 250 mg in the mornings.  CBC with absolute neutrophil count is elevated, we will need to recheck in a few days along with a repeat ammonia level.  Discharge Planning: Social work and case management to assist with discharge planning and identification of hospital follow-up needs prior to discharge Estimated LOS: 5-7 days Discharge Concerns: Need to establish a safety plan; Medication compliance and effectiveness Discharge Goals: Return home with outpatient referrals for mental health follow-up including medication management/psychotherapy   I certify that inpatient services furnished can reasonably be expected to improve the patient's condition.      [name], NP 07/18/2023, 4:07 PM

## 2023-07-18 NOTE — BH IP Treatment Plan (Signed)
Interdisciplinary Treatment and Diagnostic Plan Update  07/18/2023 Time of Session: 11:05 AM  Dylan Moon MRN: [id number]  Principal Diagnosis: Bipolar affective disorder, current episode manic with psychotic symptoms (HCC)  Secondary Diagnoses: Principal Problem:   Bipolar affective disorder, current episode manic with psychotic symptoms (HCC)   Current Medications:  Current Facility-Administered Medications  Medication Dose Route Frequency Provider Last Rate Last Admin   acetaminophen (TYLENOL) tablet 650 mg  650 mg Oral Q6H PRN Lenox Ponds, NP       alum & mag hydroxide-simeth (MAALOX/MYLANTA) 200-200-20 MG/5ML suspension 30 mL  30 mL Oral Q4H PRN Lenox Ponds, NP       carvedilol (COREG) tablet 25 mg  25 mg Oral BID WC Lenox Ponds, NP   25 mg at 07/18/23 5176   diphenhydrAMINE (BENADRYL) capsule 50 mg  50 mg Oral TID PRN Lenox Ponds, NP       Or   diphenhydrAMINE (BENADRYL) injection 50 mg  50 mg Intramuscular TID PRN Lenox Ponds, NP       divalproex (DEPAKOTE) DR tablet 500 mg  500 mg Oral BID Lenox Ponds, NP   500 mg at 07/18/23 0827   Eslicarbazepine Acetate TABS 2 tablet  2 tablet Oral Daily Lenox Ponds, NP   2 tablet at 07/18/23 0827   haloperidol (HALDOL) tablet 5 mg  5 mg Oral TID PRN Lenox Ponds, NP       Or   haloperidol lactate (HALDOL) injection 5 mg  5 mg Intramuscular TID PRN Lenox Ponds, NP       levOCARNitine (CARNITOR) 1 GM/10ML solution 1,000 mg  1,000 mg Oral BID Lenox Ponds, NP   1,000 mg at 07/18/23 1607   loratadine (CLARITIN) tablet 10 mg  10 mg Oral Daily Lenox Ponds, NP   10 mg at 07/18/23 3710   LORazepam (ATIVAN) tablet 2 mg  2 mg Oral TID PRN Lenox Ponds, NP       Or   LORazepam (ATIVAN) injection 2 mg  2 mg Intramuscular TID PRN Lenox Ponds, NP       LORazepam (ATIVAN) injection 2 mg  2 mg Intramuscular Daily PRN [name], NP       magnesium hydroxide (MILK OF MAGNESIA) suspension  30 mL  30 mL Oral Daily PRN Lenox Ponds, NP       nicotine (NICODERM CQ - dosed in mg/24 hours) patch 14 mg  14 mg Transdermal Daily Sindy Guadeloupe, NP       paliperidone (INVEGA) 24 hr tablet 3 mg  3 mg Oral BID [name], NP   3 mg at 07/18/23 6269   traZODone (DESYREL) tablet 50 mg  50 mg Oral QHS PRN Lenox Ponds, NP   50 mg at 07/17/23 2036   zonisamide (ZONEGRAN) capsule 300 mg  300 mg Oral Daily Lenox Ponds, NP   300 mg at 07/18/23 4854   PTA Medications: Medications Prior to Admission  Medication Sig Dispense Refill Last Dose   acetaminophen (TYLENOL) 500 MG tablet Take 1,000 mg by mouth every 6 (six) hours as needed for mild pain (pain score 1-3) or moderate pain (pain score 4-6).      carvedilol (COREG) 25 MG tablet Take 1 tablet (25 mg total) by mouth 2 (two) times daily with a meal. 60 tablet 0    divalproex (DEPAKOTE) 500 MG DR tablet Take 1 tablet (500 mg total) by mouth  2 (two) times daily. 60 tablet 0    escitalopram (LEXAPRO) 10 MG tablet Take 10 mg by mouth daily.      Eslicarbazepine Acetate (APTIOM) 800 MG TABS Take 1,600 mg by mouth daily. 60 tablet 0    famotidine (PEPCID) 20 MG tablet Take 20 mg by mouth daily.      hydrOXYzine (ATARAX) 25 MG tablet Take 25 mg by mouth 2 (two) times daily as needed for anxiety.      levOCARNitine (CARNITOR) 330 MG tablet Take 990 mg by mouth 2 (two) times daily.      loratadine (CLARITIN) 10 MG tablet Take 1 tablet (10 mg total) by mouth daily. (Patient taking differently: Take 10 mg by mouth daily as needed for allergies.) 30 tablet 0    paliperidone (INVEGA SUSTENNA) 156 MG/ML SUSY injection Inject 1 mL (156 mg total) into the muscle every 30 (thirty) days. 1 mL 0    traZODone (DESYREL) 50 MG tablet Take 1 tablet (50 mg total) by mouth at bedtime as needed for sleep. 30 tablet 0    zonisamide (ZONEGRAN) 100 MG capsule Take 3 capsules (300 mg total) by mouth daily. 90 capsule 0     Patient Stressors: Legal issue    Medication change or noncompliance   Substance abuse    Patient Strengths: General fund of knowledge  Motivation for treatment/growth  Supportive family/friends   Treatment Modalities: Medication Management, Group therapy, Case management,  1 to 1 session with clinician, Psychoeducation, Recreational therapy.   Physician Treatment Plan for Primary Diagnosis: Bipolar affective disorder, current episode manic with psychotic symptoms (HCC) Long Term Goal(s):     Short Term Goals:    Medication Management: Evaluate patient's response, side effects, and tolerance of medication regimen.  Therapeutic Interventions: 1 to 1 sessions, Unit Group sessions and Medication administration.  Evaluation of Outcomes: Not Progressing  Physician Treatment Plan for Secondary Diagnosis: Principal Problem:   Bipolar affective disorder, current episode manic with psychotic symptoms (HCC)  Long Term Goal(s):     Short Term Goals:       Medication Management: Evaluate patient's response, side effects, and tolerance of medication regimen.  Therapeutic Interventions: 1 to 1 sessions, Unit Group sessions and Medication administration.  Evaluation of Outcomes: Not Progressing   RN Treatment Plan for Primary Diagnosis: Bipolar affective disorder, current episode manic with psychotic symptoms (HCC) Long Term Goal(s): Knowledge of disease and therapeutic regimen to maintain health will improve  Short Term Goals: Ability to remain free from injury will improve, Ability to verbalize frustration and anger appropriately will improve, Ability to demonstrate self-control, Ability to participate in decision making will improve, Ability to verbalize feelings will improve, Ability to disclose and discuss suicidal ideas, Ability to identify and develop effective coping behaviors will improve, and Compliance with prescribed medications will improve  Medication Management: RN will administer medications as ordered by  provider, will assess and evaluate patient's response and provide education to patient for prescribed medication. RN will report any adverse and/or side effects to prescribing provider.  Therapeutic Interventions: 1 on 1 counseling sessions, Psychoeducation, Medication administration, Evaluate responses to treatment, Monitor vital signs and CBGs as ordered, Perform/monitor CIWA, COWS, AIMS and Fall Risk screenings as ordered, Perform wound care treatments as ordered.  Evaluation of Outcomes: Not Progressing   LCSW Treatment Plan for Primary Diagnosis: Bipolar affective disorder, current episode manic with psychotic symptoms (HCC) Long Term Goal(s): Safe transition to appropriate next level of care at discharge, Engage patient in therapeutic  group addressing interpersonal concerns.  Short Term Goals: Engage patient in aftercare planning with referrals and resources, Increase social support, Increase ability to appropriately verbalize feelings, Increase emotional regulation, Facilitate acceptance of mental health diagnosis and concerns, Facilitate patient progression through stages of change regarding substance use diagnoses and concerns, Identify triggers associated with mental health/substance abuse issues, and Increase skills for wellness and recovery  Therapeutic Interventions: Assess for all discharge needs, 1 to 1 time with Social worker, Explore available resources and support systems, Assess for adequacy in community support network, Educate family and significant other(s) on suicide prevention, Complete Psychosocial Assessment, Interpersonal group therapy.  Evaluation of Outcomes: Not Progressing   Progress in Treatment: Attending groups: Yes. Participating in groups: Yes. Taking medication as prescribed: Yes. Toleration medication: Yes. Family/Significant other contact made: No, will contact:  Will contact whoever pt gives CSW permission to call  Patient understands diagnosis:  No. Discussing patient identified problems/goals with staff: Yes. Medical problems stabilized or resolved: Yes. Denies suicidal/homicidal ideation: Yes. Issues/concerns per patient self-inventory: No.   New problem(s) identified: No, Describe:  None reported   New Short Term/Long Term Goal(s): medication stabilization, elimination of SI thoughts, development of comprehensive mental wellness plan.    Patient Goals:  " stay on top of my medication "   Discharge Plan or Barriers: Patient recently admitted. CSW will continue to follow and assess for appropriate referrals and possible discharge planning.    Reason for Continuation of Hospitalization: Aggression Delusions  Mania Medication stabilization Other; describe Not taking his medication   Estimated Length of Stay: 5-7 days   Last 3 [place] Suicide Severity Risk Score: Flowsheet Row Ad[facility] (Current) [facility] Most recent reading at 07/16/2023  8:05 PM ED from 07/14/2023 in [facility] Emergency Department at [facility] Most recent reading at 07/14/2023  2:31 PM ED from 07/14/2023 in [facility] Most recent reading at 07/14/2023 12:54 PM  C-SSRS RISK CATEGORY No Risk No Risk No Risk       Last PHQ 2/9 Scores:    11/13/2021   10:45 AM 06/01/2021   11:43 AM 04/13/2021   12:26 PM  Depression screen PHQ 2/9  Decreased Interest 1 0 0  Down, Depressed, Hopeless 0 0 0  PHQ - 2 Score 1 0 0    Scribe for Treatment Team: Beather Arbour 07/18/2023 2:46 PM

## 2023-07-18 NOTE — Progress Notes (Signed)
Pt family member brought in pt medications, they were counted , Labeled,  bagged and placed in pt locker, form signed by pt and copy given to pt and copy placed in pt chart, Pt seizure medication Aptiom (Eslicarbazepine) was consolidated into one bottle (53 pills) and placed in pt medication drawer , the other empty bottle was placed in the bag  along with the other medications and labeled as empty.

## 2023-07-18 NOTE — Plan of Care (Signed)
Problem: Activity: Goal: Interest or engagement in activities will improve Outcome: Progressing   Problem: Health Behavior/Discharge Planning: Goal: Compliance with treatment plan for underlying cause of condition will improve Outcome: Progressing   Problem: Safety: Goal: Periods of time without injury will increase Outcome: Progressing   Pt presents animated , fidgety with pressured speech and fair eye contact. Denies SI, HI, AVH and pain when assessed. Visible in milieu majority of this shift, restless but pleasant on interactions. Reports he slept well last night with good appetite. Tolerates meals and fluids well without discomfort.  Remains medication compliant without adverse drug reactions. Verbalized concerns about discharge, verbalized understanding when educated on process. Q 15 minutes safety checks maintained as ordered. Support, encouragement and reassurance offered. Safety maintained on and off unit.

## 2023-07-18 NOTE — Group Note (Signed)
Date:  07/18/2023 Time:  8:50 PM  Group Topic/Focus:  Wrap-Up Group:   The focus of this group is to help patients review their daily goal of treatment and discuss progress on daily workbooks.    Participation Level:  Did Not Attend   Scot Dock 07/18/2023, 8:50 PM

## 2023-07-18 NOTE — Progress Notes (Signed)
   07/18/23 2145  Psych Admission Type (Psych Patients Only)  Admission Status Involuntary  Psychosocial Assessment  Patient Complaints Anxiety  Eye Contact Fair  Facial Expression Sad;Anxious  Affect Anxious;Preoccupied;Labile  Speech Slow  Interaction Assertive  Motor Activity Slow  Appearance/Hygiene In scrubs  Behavior Characteristics Cooperative  Mood Anxious;Preoccupied  Aggressive Behavior  Effect No apparent injury  Thought Process  Coherency Circumstantial;Disorganized;Concrete thinking  Content Blaming others  Delusions Paranoid  Perception Hallucinations  Hallucination Auditory  Judgment Impaired  Confusion Mild  Danger to Self  Current suicidal ideation? Denies

## 2023-07-18 NOTE — Plan of Care (Signed)
  Problem: Education: Goal: Emotional status will improve Outcome: Progressing Goal: Mental status will improve Outcome: Progressing   Problem: Activity: Goal: Interest or engagement in activities will improve Outcome: Progressing   Problem: Coping: Goal: Ability to demonstrate self-control will improve Outcome: Progressing   

## 2023-07-18 NOTE — Progress Notes (Signed)
Recreation Therapy Notes  Patient admitted to unit 10.23.24. Due to admission within last year, no new recreation therapy assessment conducted at this time. Last assessment conducted on 5.7.24.    Reason for current admission per patient, "panic attack".  Patient reports having no stressors from previous admission.  Patient identified no new coping skills or leisure interests.  Patient identified strengths as "photography".  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  Patient denies SI, HI, AVH at this time.    Information found below from assessment conducted 5.7.24.  Coping Skills: TV, Sports, Exercise, Music, Talk, Prayer  Leisure Interests: Photography; Shoot music videos  Patient Strengths: Athletic, Intelligent  Areas of Improvement: Eating    Nancee Brownrigg-McCall, LRT,CTRS Jenean Escandon A Kalee Broxton-McCall 07/18/2023 1:16 PM

## 2023-07-19 DIAGNOSIS — F312 Bipolar disorder, current episode manic severe with psychotic features: Secondary | ICD-10-CM | POA: Diagnosis not present

## 2023-07-19 NOTE — [facility] Group Notes (Signed)
[facility] Group Notes:  (Nursing/MHT/Case Management/Adjunct)  Date:  07/19/2023  Time:  8:41 PM  Type of Therapy:   Wrap-up group  Participation Level:  Active  Participation Quality:  Appropriate  Affect:  Appropriate  Cognitive:  Appropriate  Insight:  Appropriate  Engagement in Group:  Engaged  Modes of Intervention:  Education  Summary of Progress/Problems: Pt goal to make people smile. Pt reports he met his goal. Rated his day 10/10.   Noah Delaine 07/19/2023, 8:41 PM

## 2023-07-19 NOTE — Progress Notes (Signed)
[facility] MD Progress Note  07/19/2023 4:08 PM Dylan Moon  MRN:  [id number] Subjective:   Dylan Moon is a 27 yr old male who presented on 10/21 to [facility] via [facility] under IVC for paranoia and bizarre behavior, he was admitted to [facility] on 10/24.  PPHx is significant for Schizoaffective Disorder, Bipolar Type, PTSD, ADHD, and GAD, and no Suicide Attempts, Self Injurious Behavior, and no Prior Psychiatric Hospitalizations, he does have an ACT Team- [facility].  PMHx is significant for Seizures.   Case was discussed in the multidisciplinary team. MAR was reviewed and patient was compliant with medications.  He received PRN Trazodone last night.   Psychiatric Team made the following recommendations yesterday: -Continue Invega 3 mg BID for psychosis and mood stability -Decrease Depakote to DR 250 mg AM and 500 mg PM for mood stability -Start Lactulose 10 g BID   On interview today patient reports he slept good last night.  He reports his appetite is doing good.  He reports no SI, HI, or AVH.  He reports no Paranoia or Ideas of Reference.  He reports no issues with his medications.  He reports he is feeling good today.  He reports his mood is stable.  Discussed with him that we would redraw the lab work tomorrow morning to recheck his ammonia level and he reported understanding.  Principal Problem: Bipolar affective disorder, current episode manic with psychotic symptoms (HCC) Diagnosis: Principal Problem:   Bipolar affective disorder, current episode manic with psychotic symptoms (HCC)  Total Time spent with patient:  I personally spent 35 minutes on the unit in direct patient care. The direct patient care time included face-to-face time with the patient, reviewing the patient's chart, communicating with other professionals, and coordinating care. Greater than 50% of this time was spent in counseling or coordinating care with the patient regarding goals of hospitalization, psycho-education, and discharge  planning needs.   Past Psychiatric History: Schizoaffective Disorder, Bipolar Type, PTSD, ADHD, and GAD, and no Suicide Attempts, Self Injurious Behavior, and no Prior Psychiatric Hospitalizations, he does have an ACT Team- [facility].  Past Medical History:  Past Medical History:  Diagnosis Date   ADHD    Bipolar 1 disorder (HCC)    Schizoaffective disorder (HCC)    Scoliosis    Seizures (HCC)    most recent 12/02/17    Past Surgical History:  Procedure Laterality Date   NO PAST SURGERIES     Family History:  Family History  Problem Relation Age of Onset   Diabetes Mother    Hypertension Mother    Cancer Other    Diabetes Father    Seizures Maternal Grandfather    Family Psychiatric  History: Unknown Social History:  Social History   Substance and Sexual Activity  Alcohol Use Not Currently     Social History   Substance and Sexual Activity  Drug Use Yes   Types: Marijuana   Comment: Daily use of marijuana.    Social History   Socioeconomic History   Marital status: Single    Spouse name: Not on file   Number of children: 0   Years of education: HS   Highest education level: Not on file  Occupational History   Occupation: Product manager of videos  Tobacco Use   Smoking status: Some Days    Types: Cigars   Smokeless tobacco: Never  Vaping Use   Vaping status: Never Used  Substance and Sexual Activity   Alcohol use: Not Currently   Drug  use: Yes    Types: Marijuana    Comment: Daily use of marijuana.   Sexual activity: Not Currently  Other Topics Concern   Not on file  Social History Narrative   Lives at home his mother.   3-4 sodas per week.   Right-handed.   Social Determinants of Health   Financial Resource Strain: Not on file  Food Insecurity: Food Insecurity Present (07/16/2023)   Hunger Vital Sign    Worried About Running Out of Food in the Last Year: Often true    Ran Out of Food in the Last Year: Sometimes true  Transportation Needs:  No Transportation Needs (07/16/2023)   PRAPARE - Administrator, Civil Service (Medical): No    Lack of Transportation (Non-Medical): No  Physical Activity: Not on file  Stress: Not on file  Social Connections: Not on file   Additional Social History:                         Sleep: Good  Appetite:  Good  Current Medications: Current Facility-Administered Medications  Medication Dose Route Frequency Provider Last Rate Last Admin   acetaminophen (TYLENOL) tablet 650 mg  650 mg Oral Q6H PRN Lenox Ponds, NP       alum & mag hydroxide-simeth (MAALOX/MYLANTA) 200-200-20 MG/5ML suspension 30 mL  30 mL Oral Q4H PRN Lenox Ponds, NP       carvedilol (COREG) tablet 25 mg  25 mg Oral BID WC Lenox Ponds, NP   25 mg at 07/19/23 0809   diphenhydrAMINE (BENADRYL) capsule 50 mg  50 mg Oral TID PRN Lenox Ponds, NP       Or   diphenhydrAMINE (BENADRYL) injection 50 mg  50 mg Intramuscular TID PRN Lenox Ponds, NP       divalproex (DEPAKOTE) DR tablet 250 mg  250 mg Oral Q1400 [name], NP   250 mg at 07/19/23 1308   divalproex (DEPAKOTE) DR tablet 500 mg  500 mg Oral QHS [name], NP   500 mg at 07/18/23 2052   Eslicarbazepine Acetate TABS 2 tablet  2 tablet Oral Daily Lenox Ponds, NP   2 tablet at 07/19/23 0809   haloperidol (HALDOL) tablet 5 mg  5 mg Oral TID PRN Lenox Ponds, NP       Or   haloperidol lactate (HALDOL) injection 5 mg  5 mg Intramuscular TID PRN Lenox Ponds, NP       lactulose (CHRONULAC) 10 GM/15ML solution 10 g  10 g Oral BID [name], NP   10 g at 07/19/23 1327   levOCARNitine (CARNITOR) 1 GM/10ML solution 1,000 mg  1,000 mg Oral BID Lenox Ponds, NP   1,000 mg at 07/19/23 0810   loratadine (CLARITIN) tablet 10 mg  10 mg Oral Daily Lenox Ponds, NP   10 mg at 07/19/23 6578   LORazepam (ATIVAN) tablet 2 mg  2 mg Oral TID PRN Lenox Ponds, NP       Or   LORazepam (ATIVAN) injection 2 mg  2 mg  Intramuscular TID PRN Lenox Ponds, NP       LORazepam (ATIVAN) injection 2 mg  2 mg Intramuscular Daily PRN [name], NP       magnesium hydroxide (MILK OF MAGNESIA) suspension 30 mL  30 mL Oral Daily PRN Lenox Ponds, NP       nicotine (NICODERM CQ -  dosed in mg/24 hours) patch 14 mg  14 mg Transdermal Daily Sindy Guadeloupe, NP       paliperidone (INVEGA) 24 hr tablet 3 mg  3 mg Oral BID [name], NP   3 mg at 07/19/23 0809   traZODone (DESYREL) tablet 50 mg  50 mg Oral QHS PRN Lenox Ponds, NP   50 mg at 07/18/23 2052   zonisamide (ZONEGRAN) capsule 300 mg  300 mg Oral Daily Lenox Ponds, NP   300 mg at 07/19/23 8841    Lab Results:  Results for orders placed or performed during the hospital encounter of 07/16/23 (from the past 48 hour(s))  Valproic acid level     Status: None   Collection Time: 07/18/23  6:42 AM  Result Value Ref Range   Valproic Acid Lvl 70 50.0 - 100.0 ug/mL    Comment: Performed at [facility], [address], [place], [place] [address]  Ammonia     Status: Abnormal   Collection Time: 07/18/23  6:42 AM  Result Value Ref Range   Ammonia 70 (H) 9 - 35 umol/L    Comment: Performed at [facility], [address] [place], [place] [address]  TSH     Status: None   Collection Time: 07/18/23  6:42 AM  Result Value Ref Range   TSH 2.536 0.350 - 4.500 uIU/mL    Comment: Performed by a 3rd Generation assay with a functional sensitivity of <=0.01 uIU/mL. Performed at [facility], [address], [place], [place] [address]   VITAMIN D 25 Hydroxy (Vit-D Deficiency, Fractures)     Status: Abnormal   Collection Time: 07/18/23  6:42 AM  Result Value Ref Range   Vit D, 25-Hydroxy 28.82 (L) 30 - 100 ng/mL    Comment: (NOTE) Vitamin D deficiency has been defined by the Institute of Medicine  and an Endocrine Society practice guideline as a level of serum 25-OH  vitamin D less than 20 ng/mL  (1,2). The Endocrine Society went on to  further define vitamin D insufficiency as a level between 21 and 29  ng/mL (2).  1. IOM (Institute of Medicine). 2010. Dietary reference intakes for  calcium and D. [place]: [employer]. 2. Holick MF, Binkley [place], Bischoff-Ferrari HA, et al. Evaluation,  treatment, and prevention of vitamin D deficiency: an Endocrine  Society clinical practice guideline, JCEM. 2011 Jul; 96(7): 1911-30.  Performed at [facility], [address], [place], [place] [address]     Blood Alcohol level:  Lab Results  Component Value Date   [facility] <10 07/14/2023   ETH <10 01/26/2023    Metabolic Disorder Labs: Lab Results  Component Value Date   HGBA1C 5.6 07/17/2023   MPG 114.02 07/17/2023   MPG 111.15 01/30/2023   Lab Results  Component Value Date   PROLACTIN 40.3 (H) 12/06/2022   Lab Results  Component Value Date   CHOL 199 07/17/2023   TRIG 193 (H) 07/17/2023   HDL 59 07/17/2023   CHOLHDL 3.4 07/17/2023   VLDL 39 07/17/2023   LDLCALC 101 (H) 07/17/2023   LDLCALC 91 01/30/2023    Physical Findings: AIMS:  , ,  ,  ,    CIWA:    COWS:     Musculoskeletal: Strength & Muscle Tone: within normal limits Gait & Station: normal Patient leans: N/A  Psychiatric Specialty Exam:  Presentation  General Appearance:  Appropriate for Environment; Casual  Eye Contact:  Good  Speech: Clear and Coherent; Normal Rate  Speech Volume: Normal  Handedness: Right   Mood and Affect  Mood: -- ("ok")  Affect: Non-Congruent (smiling during entire interview)   Thought Process  Thought Processes: Coherent  Descriptions of Associations:Intact  Orientation:Full (Time, Place and Person)  Thought Content:Logical; WDL  History of Schizophrenia/Schizoaffective disorder:Yes  Duration of Psychotic Symptoms:Greater than six months  Hallucinations:Hallucinations: None  Ideas of Reference:None  Suicidal  Thoughts:Suicidal Thoughts: No  Homicidal Thoughts:Homicidal Thoughts: No   Sensorium  Memory: Immediate Fair  Judgment: Intact  Insight: Fair   Art therapist  Concentration: Good  Attention Span: Good  Recall: Fair  Fund of Knowledge: Fair  Language: Good   Psychomotor Activity  Psychomotor Activity: Psychomotor Activity: Normal   Assets  Assets: Resilience; Social Support; Housing   Sleep  Sleep: Sleep: Good Number of Hours of Sleep: 8.5    Physical Exam: Physical Exam Vitals and nursing note reviewed.  Constitutional:      General: He is not in acute distress.    Appearance: Normal appearance. He is normal weight. He is not ill-appearing or toxic-appearing.  HENT:     Head: Normocephalic and atraumatic.  Pulmonary:     Effort: Pulmonary effort is normal.  Musculoskeletal:        General: Normal range of motion.  Neurological:     General: No focal deficit present.     Mental Status: He is alert.    Review of Systems  Respiratory:  Negative for cough and shortness of breath.   Cardiovascular:  Negative for chest pain.  Gastrointestinal:  Negative for abdominal pain, constipation, diarrhea, nausea and vomiting.  Neurological:  Negative for dizziness, weakness and headaches.  Psychiatric/Behavioral:  Negative for depression, hallucinations and suicidal ideas. The patient is not nervous/anxious.    Blood pressure 112/75, pulse 74, temperature (!) 97.1 F (36.2 C), temperature source Oral, resp. rate 16, height 5\' 11"  (1.803 m), weight 87.1 kg, SpO2 100%. Body mass index is 26.78 kg/m.   Treatment Plan Summary: Daily contact with patient to assess and evaluate symptoms and progress in treatment and Medication management  Dylan Moon is a 27 yr old male who presented on 10/21 to [facility] via [facility] under IVC for paranoia and bizarre behavior, he was admitted to [facility] on 10/24.  PPHx is significant for Schizoaffective Disorder, Bipolar  Type, PTSD, ADHD, and GAD, and no Suicide Attempts, Self Injurious Behavior, and no Prior Psychiatric Hospitalizations, he does have an ACT Team- [facility].  PMHx is significant for Seizures.   Jurgen is showing improvement on his medications.  He did have an elevated Ammonia and so his Depakote was decreased and Lactulose was started.  We will order a CMP, CBC, Ammonia, and Depakote level for tomorrow morning.  We will not make any changes to his medications.  We will continue to monitor.   Schizoaffective Disorder, Bipolar Type: -Continue Invega 3 mg BID for psychosis and mood stability -Continue Depakote DR 250 mg AM and 500 mg PM for mood stability -Continue Agitation Protocol: Haldol/Ativan/Benadryl   Seizures: -Continue Eslicarbazepine Acetate 2 tablets daily -Continue Zonegran 300 mg daily -Continue Ativan 2 mg IM daily PRN   Hyperammonemia: -Continue Lactulose 10 g BID    Nicotine Dependence: -Continue Nicotine Patch 14 mg daily   -Continue Carvedilol 25 mg BID -Continue Levocarnitine 1000 mg BID -Continue Loratadine 10 mg daily -Continue PRN's: Tylenol, Maalox, Atarax, Milk of Magnesia, Trazodone   Lauro Franklin, MD 07/19/2023, 4:08 PM

## 2023-07-19 NOTE — Plan of Care (Signed)
Problem: Education: Goal: Emotional status will improve Outcome: Progressing   Problem: Safety: Goal: Periods of time without injury will increase 07/19/2023 1734 by Sherryl Manges, RN Outcome: Progressing 07/19/2023 1711 by Sherryl Manges, RN Outcome: Progressing   Problem: Activity: Goal: Interest or engagement in activities will improve Outcome: Progressing   Pt denies SI, HI, AVH and pain when assessed. Reports he slept well last night with good appetite. Observed to be animated, intrusive, restless at times but remains verbally redirectable and cooperative with care.  Rates his depression, anxiety and hopelessness all 0/10.  Attended scheduled groups in dayroom and off milieu to courtyard and cafeteria for meals; returned without issues. Tolerated meals and fluids well when offered.  Safety checks maintained at Q 15 minutes intervals.

## 2023-07-19 NOTE — Progress Notes (Signed)
   07/19/23 2136  Psych Admission Type (Psych Patients Only)  Admission Status Involuntary  Psychosocial Assessment  Patient Complaints None  Eye Contact Fair  Facial Expression Animated  Affect Appropriate to circumstance  Speech Soft  Interaction Assertive  Motor Activity Other (Comment) (WNL)  Appearance/Hygiene In scrubs  Behavior Characteristics Cooperative  Mood Other (Comment);Pleasant (calm)  Thought Process  Coherency WDL  Delusions None reported or observed  Perception WDL  Hallucination None reported or observed  Judgment Impaired  Confusion None  Danger to Self  Current suicidal ideation? Denies  Danger to Others  Danger to Others None reported or observed   D: Patient in dayroom calmy watching TVreports she had a good day and is able to engage therapeutically. A: Medications administered as prescribed. Support and encouragement provided as needed.  R: Patient remains safe on the unit. Plan of care ongoing for safety and stability. No further concerns as of present. Pt expresses no other needs at this time.

## 2023-07-20 DIAGNOSIS — F312 Bipolar disorder, current episode manic severe with psychotic features: Secondary | ICD-10-CM | POA: Diagnosis not present

## 2023-07-20 LAB — COMPREHENSIVE METABOLIC PANEL
ALT: 28 U/L (ref 0–44)
AST: 23 U/L (ref 15–41)
Albumin: 4.1 g/dL (ref 3.5–5.0)
Alkaline Phosphatase: 38 U/L (ref 38–126)
Anion gap: 13 (ref 5–15)
BUN: 12 mg/dL (ref 6–20)
CO2: 22 mmol/L (ref 22–32)
Calcium: 9.5 mg/dL (ref 8.9–10.3)
Chloride: 102 mmol/L (ref 98–111)
Creatinine, Ser: 0.78 mg/dL (ref 0.61–1.24)
GFR, Estimated: >60 mL/min (ref >60)
Glucose, Bld: 97 mg/dL (ref 70–99)
Potassium: 4.2 mmol/L (ref 3.5–5.1)
Sodium: 137 mmol/L (ref 135–145)
Total Bilirubin: 0.6 mg/dL (ref 0.3–1.2)
Total Protein: 6.8 g/dL (ref 6.5–8.1)

## 2023-07-20 LAB — CBC WITH DIFFERENTIAL/PLATELET
Abs Immature Granulocytes: 0.01 10*3/uL (ref 0.00–0.07)
Basophils Absolute: 0 10*3/uL (ref 0.0–0.1)
Basophils Relative: 0 %
Eosinophils Absolute: 0.1 10*3/uL (ref 0.0–0.5)
Eosinophils Relative: 1 %
HCT: 45.3 % (ref 39.0–52.0)
Hemoglobin: 14.7 g/dL (ref 13.0–17.0)
Immature Granulocytes: 0 %
Lymphocytes Relative: 59 %
Lymphs Abs: 3.9 10*3/uL (ref 0.7–4.0)
MCH: 28.1 pg (ref 26.0–34.0)
MCHC: 32.5 g/dL (ref 30.0–36.0)
MCV: 86.5 fL (ref 80.0–100.0)
Monocytes Absolute: 0.4 10*3/uL (ref 0.1–1.0)
Monocytes Relative: 6 %
Neutro Abs: 2.3 10*3/uL (ref 1.7–7.7)
Neutrophils Relative %: 34 %
Platelets: 220 10*3/uL (ref 150–400)
RBC: 5.24 MIL/uL (ref 4.22–5.81)
RDW: 13.2 % (ref 11.5–15.5)
WBC: 6.7 10*3/uL (ref 4.0–10.5)
nRBC: 0 % (ref 0.0–0.2)

## 2023-07-20 LAB — AMMONIA: Ammonia: 94 umol/L — ABNORMAL HIGH (ref 9–35)

## 2023-07-20 LAB — VALPROIC ACID LEVEL: Valproic Acid Lvl: 75 ug/mL (ref 50.0–100.0)

## 2023-07-20 MED ORDER — LACTULOSE 10 GM/15ML PO SOLN
20.0000 g | Freq: Once | ORAL | Status: AC
  Administered 2023-07-20: 20 g via ORAL
  Filled 2023-07-20: qty 30

## 2023-07-20 MED ORDER — LACTULOSE 10 GM/15ML PO SOLN
30.0000 g | Freq: Two times a day (BID) | ORAL | Status: DC
  Administered 2023-07-20 – 2023-07-23 (×7): 30 g via ORAL
  Filled 2023-07-20 (×11): qty 45

## 2023-07-20 NOTE — Plan of Care (Signed)

## 2023-07-20 NOTE — [facility] Group Notes (Signed)
Adult Psychoeducational Group Note  Date:  07/20/2023 Time:  9:04 PM  Group Topic/Focus:  Wrap-Up Group:   The focus of this group is to help patients review their daily goal of treatment and discuss progress on daily workbooks.  Participation Level:  Active  Participation Quality:  Appropriate  Affect:  Flat  Cognitive:  Appropriate  Insight: Appropriate  Engagement in Group:  Engaged  Modes of Intervention:  Discussion  Additional Comments:   Pt states that he had a good day and shared a visit from his girlfriend. Pt endorsed anxiety at a 2 and states he's hopful to d/c home tomorrow  Vevelyn Pat 07/20/2023, 9:04 PM

## 2023-07-20 NOTE — Progress Notes (Signed)
   07/20/23 2215  Psych Admission Type (Psych Patients Only)  Admission Status Involuntary  Psychosocial Assessment  Patient Complaints Worrying  Eye Contact Fair  Facial Expression Anxious  Affect Anxious  Speech Slow;Soft  Interaction Assertive  Motor Activity Slow  Appearance/Hygiene Improved  Behavior Characteristics Cooperative  Mood Anxious;Preoccupied  Aggressive Behavior  Effect No apparent injury  Thought Process  Coherency Circumstantial  Content Blaming others  Delusions WDL  Perception WDL  Hallucination None reported or observed  Judgment Impaired  Confusion None  Danger to Self  Current suicidal ideation? Denies  Danger to Others  Danger to Others None reported or observed

## 2023-07-20 NOTE — Group Note (Signed)
Date:  07/20/2023 Time:  1:23 PM  Group Topic/Focus:  Goals Group:   The focus of this group is to help patients establish daily goals to achieve during treatment and discuss how the patient can incorporate goal setting into their daily lives to aide in recovery.    Participation Level:  Active  Participation Quality:  Appropriate  Affect:  Appropriate  Cognitive:  Appropriate  Insight: Good  Engagement in Group:  Engaged  Modes of Intervention:  Discussion  Additional Comments:  Pt participated.   Gwenevere Abbot Ramsay 07/20/2023, 1:23 PM

## 2023-07-20 NOTE — Progress Notes (Signed)
Pt was irritable this morning focused on leaving hospital and upcoming court date. Pt has participated in unit activities and is not irritable this afternoon. Pt reports 3 bowel movements today. Nurse educated pt about effects of lactulose. Fluids encouraged. Pt denies SI/HI/AVH.

## 2023-07-20 NOTE — Plan of Care (Signed)
  Problem: Education: Goal: Mental status will improve Outcome: Progressing   Problem: Activity: Goal: Interest or engagement in activities will improve Outcome: Progressing   

## 2023-07-20 NOTE — Progress Notes (Signed)
[place] MD Progress Note  07/20/2023 2:43 PM Dylan Moon  MRN:  [id number] Subjective:   Dylan Moon is a 27 yr old male who presented on 10/21 to [facility] via [facility] under IVC for paranoia and bizarre behavior, he was admitted to [facility] on 10/24.  PPHx is significant for Schizoaffective Disorder, Bipolar Type, PTSD, ADHD, and GAD, and no Suicide Attempts, Self Injurious Behavior, and no Prior Psychiatric Hospitalizations, he does have an ACT Team- [facility].  PMHx is significant for Seizures.   Case was discussed in the multidisciplinary team. MAR was reviewed and patient was compliant with medications.  He received PRN Trazodone last night.   Psychiatric Team made the following recommendations yesterday: -Continue Invega 3 mg BID for psychosis and mood stability -Continue Depakote DR 250 mg AM and 500 mg PM for mood stability -Continue Lactulose 10 g BID   On interview today patient reports he slept good last night.  He reports his appetite is doing good.  He reports no SI, HI, or AVH.  He reports no Paranoia or Ideas of Reference.  He reports no issues with his medications.  Discussed with him that his Ammonia level was increased.  Discussed that due to this we would increase his Lactulose and would redraw his Ammonia tomorrow morning.  He reported understanding and was agreeable.  He reports no other concerns at present.   Principal Problem: Bipolar affective disorder, current episode manic with psychotic symptoms (HCC) Diagnosis: Principal Problem:   Bipolar affective disorder, current episode manic with psychotic symptoms (HCC)  Total Time spent with patient:  I personally spent 35 minutes on the unit in direct patient care. The direct patient care time included face-to-face time with the patient, reviewing the patient's chart, communicating with other professionals, and coordinating care. Greater than 50% of this time was spent in counseling or coordinating care with the patient regarding goals  of hospitalization, psycho-education, and discharge planning needs.   Past Psychiatric History: Schizoaffective Disorder, Bipolar Type, PTSD, ADHD, and GAD, and no Suicide Attempts, Self Injurious Behavior, and no Prior Psychiatric Hospitalizations, he does have an ACT Team- [facility].  Past Medical History:  Past Medical History:  Diagnosis Date   ADHD    Bipolar 1 disorder (HCC)    Schizoaffective disorder (HCC)    Scoliosis    Seizures (HCC)    most recent 12/02/17    Past Surgical History:  Procedure Laterality Date   NO PAST SURGERIES     Family History:  Family History  Problem Relation Age of Onset   Diabetes Mother    Hypertension Mother    Cancer Other    Diabetes Father    Seizures Maternal Grandfather    Family Psychiatric  History: Unknown Social History:  Social History   Substance and Sexual Activity  Alcohol Use Not Currently     Social History   Substance and Sexual Activity  Drug Use Yes   Types: Marijuana   Comment: Daily use of marijuana.    Social History   Socioeconomic History   Marital status: Single    Spouse name: Not on file   Number of children: 0   Years of education: HS   Highest education level: Not on file  Occupational History   Occupation: Product manager of videos  Tobacco Use   Smoking status: Some Days    Types: Cigars   Smokeless tobacco: Never  Vaping Use   Vaping status: Never Used  Substance and Sexual Activity   Alcohol  use: Not Currently   Drug use: Yes    Types: Marijuana    Comment: Daily use of marijuana.   Sexual activity: Not Currently  Other Topics Concern   Not on file  Social History Narrative   Lives at home his mother.   3-4 sodas per week.   Right-handed.   Social Determinants of Health   Financial Resource Strain: Not on file  Food Insecurity: Food Insecurity Present (07/16/2023)   Hunger Vital Sign    Worried About Running Out of Food in the Last Year: Often true    Ran Out of Food in  the Last Year: Sometimes true  Transportation Needs: No Transportation Needs (07/16/2023)   PRAPARE - Administrator, Civil Service (Medical): No    Lack of Transportation (Non-Medical): No  Physical Activity: Not on file  Stress: Not on file  Social Connections: Not on file   Additional Social History:                         Sleep: Good  Appetite:  Good  Current Medications: Current Facility-Administered Medications  Medication Dose Route Frequency Provider Last Rate Last Admin   acetaminophen (TYLENOL) tablet 650 mg  650 mg Oral Q6H PRN Lenox Ponds, NP       alum & mag hydroxide-simeth (MAALOX/MYLANTA) 200-200-20 MG/5ML suspension 30 mL  30 mL Oral Q4H PRN Lenox Ponds, NP       carvedilol (COREG) tablet 25 mg  25 mg Oral BID WC Lenox Ponds, NP   25 mg at 07/20/23 1610   diphenhydrAMINE (BENADRYL) capsule 50 mg  50 mg Oral TID PRN Lenox Ponds, NP       Or   diphenhydrAMINE (BENADRYL) injection 50 mg  50 mg Intramuscular TID PRN Lenox Ponds, NP       divalproex (DEPAKOTE) DR tablet 250 mg  250 mg Oral Q1400 [name], NP   250 mg at 07/20/23 0824   divalproex (DEPAKOTE) DR tablet 500 mg  500 mg Oral QHS Nkwenti, Doris, NP   500 mg at 07/19/23 2103   Eslicarbazepine Acetate TABS 2 tablet  2 tablet Oral Daily Lenox Ponds, NP   2 tablet at 07/20/23 9604   haloperidol (HALDOL) tablet 5 mg  5 mg Oral TID PRN Lenox Ponds, NP       Or   haloperidol lactate (HALDOL) injection 5 mg  5 mg Intramuscular TID PRN Lenox Ponds, NP       lactulose (CHRONULAC) 10 GM/15ML solution 30 g  30 g Oral BID Lauro Franklin, MD   30 g at 07/20/23 1338   levOCARNitine (CARNITOR) 1 GM/10ML solution 1,000 mg  1,000 mg Oral BID Lenox Ponds, NP   1,000 mg at 07/20/23 5409   loratadine (CLARITIN) tablet 10 mg  10 mg Oral Daily Lenox Ponds, NP   10 mg at 07/20/23 0825   LORazepam (ATIVAN) tablet 2 mg  2 mg Oral TID PRN Lenox Ponds, NP       Or   LORazepam (ATIVAN) injection 2 mg  2 mg Intramuscular TID PRN Lenox Ponds, NP       LORazepam (ATIVAN) injection 2 mg  2 mg Intramuscular Daily PRN [name], NP       magnesium hydroxide (MILK OF MAGNESIA) suspension 30 mL  30 mL Oral Daily PRN Lenox Ponds, NP  nicotine (NICODERM CQ - dosed in mg/24 hours) patch 14 mg  14 mg Transdermal Daily Sindy Guadeloupe, NP       paliperidone (INVEGA) 24 hr tablet 3 mg  3 mg Oral BID [name], NP   3 mg at 07/20/23 0825   traZODone (DESYREL) tablet 50 mg  50 mg Oral QHS PRN Lenox Ponds, NP   50 mg at 07/19/23 2103   zonisamide (ZONEGRAN) capsule 300 mg  300 mg Oral Daily Lenox Ponds, NP   300 mg at 07/20/23 0825    Lab Results:  Results for orders placed or performed during the hospital encounter of 07/16/23 (from the past 48 hour(s))  Comprehensive metabolic panel     Status: None   Collection Time: 07/20/23  6:29 AM  Result Value Ref Range   Sodium 137 135 - 145 mmol/L   Potassium 4.2 3.5 - 5.1 mmol/L   Chloride 102 98 - 111 mmol/L   CO2 22 22 - 32 mmol/L   Glucose, Bld 97 70 - 99 mg/dL    Comment: Glucose reference range applies only to samples taken after fasting for at least 8 hours.   BUN 12 6 - 20 mg/dL   Creatinine, Ser 0.45 0.61 - 1.24 mg/dL   Calcium 9.5 8.9 - 40.9 mg/dL   Total Protein 6.8 6.5 - 8.1 g/dL   Albumin 4.1 3.5 - 5.0 g/dL   AST 23 15 - 41 U/L   ALT 28 0 - 44 U/L   Alkaline Phosphatase 38 38 - 126 U/L   Total Bilirubin 0.6 0.3 - 1.2 mg/dL   GFR, Estimated >81 >19 mL/min    Comment: (NOTE) Calculated using the CKD-EPI Creatinine Equation (2021)    Anion gap 13 5 - 15    Comment: Performed at [facility], [address] [place], [place] [address]  Ammonia     Status: Abnormal   Collection Time: 07/20/23  6:29 AM  Result Value Ref Range   Ammonia 94 (H) 9 - 35 umol/L    Comment: SLIGHT HEMOLYSIS Performed at [facility], [address] [place], [place] [address]   Valproic acid level     Status: None   Collection Time: 07/20/23  6:29 AM  Result Value Ref Range   Valproic Acid Lvl 75 50.0 - 100.0 ug/mL    Comment: Performed at [facility], [address], [place], [place] [address]  CBC with Differential     Status: None   Collection Time: 07/20/23  6:29 AM  Result Value Ref Range   WBC 6.7 4.0 - 10.5 K/uL   RBC 5.24 4.22 - 5.81 MIL/uL   Hemoglobin 14.7 13.0 - 17.0 g/dL   HCT 78.4 69.6 - 29.5 %   MCV 86.5 80.0 - 100.0 fL   MCH 28.1 26.0 - 34.0 pg   MCHC 32.5 30.0 - 36.0 g/dL   RDW 28.4 13.2 - 44.0 %   Platelets 220 150 - 400 K/uL   nRBC 0.0 0.0 - 0.2 %   Neutrophils Relative % 34 %   Neutro Abs 2.3 1.7 - 7.7 K/uL   Lymphocytes Relative 59 %   Lymphs Abs 3.9 0.7 - 4.0 K/uL   Monocytes Relative 6 %   Monocytes Absolute 0.4 0.1 - 1.0 K/uL   Eosinophils Relative 1 %   Eosinophils Absolute 0.1 0.0 - 0.5 K/uL   Basophils Relative 0 %   Basophils Absolute 0.0 0.0 - 0.1  K/uL   Immature Granulocytes 0 %   Abs Immature Granulocytes 0.01 0.00 - 0.07 K/uL    Comment: Performed at [facility], [address], [place], [place] [address]     Blood Alcohol level:  Lab Results  Component Value Date   [facility] <10 07/14/2023   ETH <10 01/26/2023    Metabolic Disorder Labs: Lab Results  Component Value Date   HGBA1C 5.6 07/17/2023   MPG 114.02 07/17/2023   MPG 111.15 01/30/2023   Lab Results  Component Value Date   PROLACTIN 40.3 (H) 12/06/2022   Lab Results  Component Value Date   CHOL 199 07/17/2023   TRIG 193 (H) 07/17/2023   HDL 59 07/17/2023   CHOLHDL 3.4 07/17/2023   VLDL 39 07/17/2023   LDLCALC 101 (H) 07/17/2023   LDLCALC 91 01/30/2023    Physical Findings: AIMS:  , ,  ,  ,    CIWA:    COWS:     Musculoskeletal: Strength & Muscle Tone: within normal limits Gait & Station: normal Patient leans: N/A  Psychiatric Specialty Exam:  Presentation   General Appearance:  Appropriate for Environment; Casual  Eye Contact: Good  Speech: Clear and Coherent; Normal Rate  Speech Volume: Normal  Handedness: Right   Mood and Affect  Mood: -- ("ok")  Affect: Congruent   Thought Process  Thought Processes: Coherent; Goal Directed  Descriptions of Associations:Intact  Orientation:Full (Time, Place and Person)  Thought Content:Logical; WDL  History of Schizophrenia/Schizoaffective disorder:Yes  Duration of Psychotic Symptoms:Greater than six months  Hallucinations:Hallucinations: None  Ideas of Reference:None  Suicidal Thoughts:Suicidal Thoughts: No  Homicidal Thoughts:Homicidal Thoughts: No   Sensorium  Memory: Immediate Fair  Judgment: Fair  Insight: Fair   Art therapist  Concentration: Good  Attention Span: Good  Recall: Fair  Fund of Knowledge: Fair  Language: Good   Psychomotor Activity  Psychomotor Activity: Psychomotor Activity: Normal   Assets  Assets: Resilience; Social Support   Sleep  Sleep: Sleep: Good Number of Hours of Sleep: 8.5    Physical Exam: Physical Exam Vitals and nursing note reviewed.  Constitutional:      General: He is not in acute distress.    Appearance: Normal appearance. He is normal weight. He is not ill-appearing or toxic-appearing.  HENT:     Head: Normocephalic and atraumatic.  Pulmonary:     Effort: Pulmonary effort is normal.  Musculoskeletal:        General: Normal range of motion.  Neurological:     General: No focal deficit present.     Mental Status: He is alert.    Review of Systems  Respiratory:  Negative for cough and shortness of breath.   Cardiovascular:  Negative for chest pain.  Gastrointestinal:  Negative for abdominal pain, constipation, diarrhea, nausea and vomiting.  Neurological:  Negative for dizziness, weakness and headaches.  Psychiatric/Behavioral:  Negative for depression, hallucinations and  suicidal ideas. The patient is not nervous/anxious.    Blood pressure 116/79, pulse 77, temperature 97.6 F (36.4 C), temperature source Oral, resp. rate 16, height 5\' 11"  (1.803 m), weight 87.1 kg, SpO2 100%. Body mass index is 26.78 kg/m.   Treatment Plan Summary: Daily contact with patient to assess and evaluate symptoms and progress in treatment and Medication management  Garcia E Westermeyer is a 27 yr old male who presented on 10/21 to [facility] via [facility] under IVC for paranoia and bizarre behavior, he was admitted to [facility] on 10/24.  PPHx is significant for  Schizoaffective Disorder, Bipolar Type, PTSD, ADHD, and GAD, and no Suicide Attempts, Self Injurious Behavior, and no Prior Psychiatric Hospitalizations, he does have an ACT Team- [facility].  PMHx is significant for Seizures.   Benoit has remained stable from a psychiatric standpoint with the decrease in his Depakote.  His Ammonia lvl was rechecked and it has resulted as even higher at 94.  Due to this we will increase his Lactulose and will redraw his Ammonia tomorrow morning.  We will not make any other changes to his medications at this time.  We will continue to monitor.   Schizoaffective Disorder, Bipolar Type: -Continue Invega 3 mg BID for psychosis and mood stability -Continue Depakote DR 250 mg AM and 500 mg PM for mood stability -Continue Agitation Protocol: Haldol/Ativan/Benadryl   Seizures: -Continue Eslicarbazepine Acetate 2 tablets daily -Continue Zonegran 300 mg daily -Continue Ativan 2 mg IM daily PRN   Hyperammonemia: -Ammonia lvl: 94 -Increase Lactulose to 30 g BID -Recheck Ammonia lvl tomorrow morning    Nicotine Dependence: -Continue Nicotine Patch 14 mg daily   -Continue Carvedilol 25 mg BID -Continue Levocarnitine 1000 mg BID -Continue Loratadine 10 mg daily -Continue PRN's: Tylenol, Maalox, Atarax, Milk of Magnesia, Trazodone   Lauro Franklin, MD 07/20/2023, 2:43 PM

## 2023-07-20 NOTE — Plan of Care (Signed)
  Problem: Education: Goal: Emotional status will improve Outcome: Progressing Goal: Mental status will improve Outcome: Progressing   Problem: Activity: Goal: Interest or engagement in activities will improve Outcome: Progressing Goal: Sleeping patterns will improve Outcome: Progressing   Problem: Physical Regulation: Goal: Ability to maintain clinical measurements within normal limits will improve Outcome: Progressing   Problem: Safety: Goal: Periods of time without injury will increase Outcome: Progressing

## 2023-07-21 ENCOUNTER — Telehealth (HOSPITAL_COMMUNITY): Payer: Self-pay

## 2023-07-21 DIAGNOSIS — F312 Bipolar disorder, current episode manic severe with psychotic features: Secondary | ICD-10-CM | POA: Diagnosis not present

## 2023-07-21 LAB — AMMONIA: Ammonia: 71 umol/L — ABNORMAL HIGH (ref 9–35)

## 2023-07-21 NOTE — Plan of Care (Signed)
  Problem: Education: Goal: Emotional status will improve Outcome: Progressing Goal: Mental status will improve Outcome: Progressing   Problem: Activity: Goal: Interest or engagement in activities will improve Outcome: Progressing Goal: Sleeping patterns will improve Outcome: Progressing

## 2023-07-21 NOTE — Progress Notes (Signed)
[facility] MD Progress Note  07/21/2023 11:37 AM Chandan GREAT ISOBE  MRN:  [id number] Subjective:   Kidus E Schirtzinger is a 27 yr old male who presented on 10/21 to [facility] via [facility] under IVC for paranoia and bizarre behavior, he was admitted to [facility] on 10/24.  PPHx is significant for Schizoaffective Disorder, Bipolar Type, PTSD, ADHD, and GAD, and no Suicide Attempts, Self Injurious Behavior, and no Prior Psychiatric Hospitalizations, he does have an ACT Team- PSI.  PMHx is significant for Seizures.   Case was discussed in the multidisciplinary team. MAR was reviewed and patient was compliant with medications.  He received PRN Trazodone last night.  Staff reports he slept well.  No complaints noted.   Psychiatric Team made the following recommendations yesterday: -Continue Invega 3 mg BID for psychosis and mood stability -Continue Depakote DR 250 mg AM and 500 mg PM for mood stability -Continue Lactulose 10 g BID   On interview today patient reports he slept good last night.  He reports his appetite is doing good.  He reports no SI, HI, or AVH.  He reports no Paranoia or Ideas of Reference.  He reports no issues with his medications.  He is focused on discharge.  Discussed with him that his Ammonia level was increased and that we are monitoring the levels.  Discussed the use of lactulose.  Apparently patient has been taking levocarnitine at home and this is a known side effect for him to have higher ammonia levels.  However concern was that his level was at 94 which was higher than 70 which was on admission.  The level today is 71.  The plan is to repeat the level again tomorrow and if it continues to trend down he can be discharged home. He can continue taking the lactulose and levocarnitine at home.  Principal Problem: Bipolar affective disorder, current episode manic with psychotic symptoms (HCC) Diagnosis: Principal Problem:   Bipolar affective disorder, current episode manic with psychotic symptoms  (HCC)  Total Time spent with patient:  I personally spent 35 minutes on the unit in direct patient care. The direct patient care time included face-to-face time with the patient, reviewing the patient's chart, communicating with other professionals, and coordinating care. Greater than 50% of this time was spent in counseling or coordinating care with the patient regarding goals of hospitalization, psycho-education, and discharge planning needs.   Past Psychiatric History: Schizoaffective Disorder, Bipolar Type, PTSD, ADHD, and GAD, and no Suicide Attempts, Self Injurious Behavior, and no Prior Psychiatric Hospitalizations, he does have an ACT Team- PSI.  Past Medical History:  Past Medical History:  Diagnosis Date   ADHD    Bipolar 1 disorder (HCC)    Schizoaffective disorder (HCC)    Scoliosis    Seizures (HCC)    most recent 12/02/17    Past Surgical History:  Procedure Laterality Date   NO PAST SURGERIES     Family History:  Family History  Problem Relation Age of Onset   Diabetes Mother    Hypertension Mother    Cancer Other    Diabetes Father    Seizures Maternal Grandfather    Family Psychiatric  History: Unknown Social History:  Social History   Substance and Sexual Activity  Alcohol Use Not Currently     Social History   Substance and Sexual Activity  Drug Use Yes   Types: Marijuana   Comment: Daily use of marijuana.    Social History   Socioeconomic History   Marital  status: Single    Spouse name: Not on file   Number of children: 0   Years of education: HS   Highest education level: Not on file  Occupational History   Occupation: Product manager of videos  Tobacco Use   Smoking status: Some Days    Types: Cigars   Smokeless tobacco: Never  Vaping Use   Vaping status: Never Used  Substance and Sexual Activity   Alcohol use: Not Currently   Drug use: Yes    Types: Marijuana    Comment: Daily use of marijuana.   Sexual activity: Not  Currently  Other Topics Concern   Not on file  Social History Narrative   Lives at home his mother.   3-4 sodas per week.   Right-handed.   Social Determinants of Health   Financial Resource Strain: Not on file  Food Insecurity: Food Insecurity Present (07/16/2023)   Hunger Vital Sign    Worried About Running Out of Food in the Last Year: Often true    Ran Out of Food in the Last Year: Sometimes true  Transportation Needs: No Transportation Needs (07/16/2023)   PRAPARE - Administrator, Civil Service (Medical): No    Lack of Transportation (Non-Medical): No  Physical Activity: Not on file  Stress: Not on file  Social Connections: Not on file   Additional Social History:                         Sleep: Good  Appetite:  Good  Current Medications: Current Facility-Administered Medications  Medication Dose Route Frequency Provider Last Rate Last Admin   acetaminophen (TYLENOL) tablet 650 mg  650 mg Oral Q6H PRN Lenox Ponds, NP       alum & mag hydroxide-simeth (MAALOX/MYLANTA) 200-200-20 MG/5ML suspension 30 mL  30 mL Oral Q4H PRN Lenox Ponds, NP       carvedilol (COREG) tablet 25 mg  25 mg Oral BID WC Lenox Ponds, NP   25 mg at 07/21/23 0741   diphenhydrAMINE (BENADRYL) capsule 50 mg  50 mg Oral TID PRN Lenox Ponds, NP       Or   diphenhydrAMINE (BENADRYL) injection 50 mg  50 mg Intramuscular TID PRN Lenox Ponds, NP       divalproex (DEPAKOTE) DR tablet 250 mg  250 mg Oral Q1400 [name], NP   250 mg at 07/21/23 0742   divalproex (DEPAKOTE) DR tablet 500 mg  500 mg Oral QHS Nkwenti, Doris, NP   500 mg at 07/20/23 2046   Eslicarbazepine Acetate TABS 2 tablet  2 tablet Oral Daily Lenox Ponds, NP   2 tablet at 07/21/23 1610   haloperidol (HALDOL) tablet 5 mg  5 mg Oral TID PRN Lenox Ponds, NP       Or   haloperidol lactate (HALDOL) injection 5 mg  5 mg Intramuscular TID PRN Lenox Ponds, NP       lactulose  (CHRONULAC) 10 GM/15ML solution 30 g  30 g Oral BID Lauro Franklin, MD   30 g at 07/21/23 0739   levOCARNitine (CARNITOR) 1 GM/10ML solution 1,000 mg  1,000 mg Oral BID Lenox Ponds, NP   1,000 mg at 07/21/23 0739   loratadine (CLARITIN) tablet 10 mg  10 mg Oral Daily Lenox Ponds, NP   10 mg at 07/21/23 0741   LORazepam (ATIVAN) tablet 2 mg  2 mg Oral TID PRN  Lenox Ponds, NP       Or   LORazepam (ATIVAN) injection 2 mg  2 mg Intramuscular TID PRN Lenox Ponds, NP       LORazepam (ATIVAN) injection 2 mg  2 mg Intramuscular Daily PRN [name], NP       magnesium hydroxide (MILK OF MAGNESIA) suspension 30 mL  30 mL Oral Daily PRN Lenox Ponds, NP       nicotine (NICODERM CQ - dosed in mg/24 hours) patch 14 mg  14 mg Transdermal Daily Sindy Guadeloupe, NP       paliperidone (INVEGA) 24 hr tablet 3 mg  3 mg Oral BID [name], NP   3 mg at 07/21/23 0741   traZODone (DESYREL) tablet 50 mg  50 mg Oral QHS PRN Lenox Ponds, NP   50 mg at 07/20/23 2044   zonisamide (ZONEGRAN) capsule 300 mg  300 mg Oral Daily Lenox Ponds, NP   300 mg at 07/21/23 4098    Lab Results:  Results for orders placed or performed during the hospital encounter of 07/16/23 (from the past 48 hour(s))  Comprehensive metabolic panel     Status: None   Collection Time: 07/20/23  6:29 AM  Result Value Ref Range   Sodium 137 135 - 145 mmol/L   Potassium 4.2 3.5 - 5.1 mmol/L   Chloride 102 98 - 111 mmol/L   CO2 22 22 - 32 mmol/L   Glucose, Bld 97 70 - 99 mg/dL    Comment: Glucose reference range applies only to samples taken after fasting for at least 8 hours.   BUN 12 6 - 20 mg/dL   Creatinine, Ser 1.19 0.61 - 1.24 mg/dL   Calcium 9.5 8.9 - 14.7 mg/dL   Total Protein 6.8 6.5 - 8.1 g/dL   Albumin 4.1 3.5 - 5.0 g/dL   AST 23 15 - 41 U/L   ALT 28 0 - 44 U/L   Alkaline Phosphatase 38 38 - 126 U/L   Total Bilirubin 0.6 0.3 - 1.2 mg/dL   GFR, Estimated >82 >95 mL/min    Comment:  (NOTE) Calculated using the CKD-EPI Creatinine Equation (2021)    Anion gap 13 5 - 15    Comment: Performed at [facility], [address] [place], [place] [address]  Ammonia     Status: Abnormal   Collection Time: 07/20/23  6:29 AM  Result Value Ref Range   Ammonia 94 (H) 9 - 35 umol/L    Comment: SLIGHT HEMOLYSIS Performed at [facility], [address], [place], [place] [address]   Valproic acid level     Status: None   Collection Time: 07/20/23  6:29 AM  Result Value Ref Range   Valproic Acid Lvl 75 50.0 - 100.0 ug/mL    Comment: Performed at [facility], [address] [place], [place] [address]  CBC with Differential     Status: None   Collection Time: 07/20/23  6:29 AM  Result Value Ref Range   WBC 6.7 4.0 - 10.5 K/uL   RBC 5.24 4.22 - 5.81 MIL/uL   Hemoglobin 14.7 13.0 - 17.0 g/dL   HCT 95.2 84.1 - 32.4 %   MCV 86.5 80.0 - 100.0 fL   MCH 28.1 26.0 - 34.0 pg   MCHC 32.5 30.0 - 36.0 g/dL   RDW 40.1 02.7 - 25.3 %   Platelets 220 150 - 400 K/uL   nRBC 0.0 0.0 -  0.2 %   Neutrophils Relative % 34 %   Neutro Abs 2.3 1.7 - 7.7 K/uL   Lymphocytes Relative 59 %   Lymphs Abs 3.9 0.7 - 4.0 K/uL   Monocytes Relative 6 %   Monocytes Absolute 0.4 0.1 - 1.0 K/uL   Eosinophils Relative 1 %   Eosinophils Absolute 0.1 0.0 - 0.5 K/uL   Basophils Relative 0 %   Basophils Absolute 0.0 0.0 - 0.1 K/uL   Immature Granulocytes 0 %   Abs Immature Granulocytes 0.01 0.00 - 0.07 K/uL    Comment: Performed at [facility], [address] [place], [place] [address]  Ammonia     Status: Abnormal   Collection Time: 07/21/23  6:42 AM  Result Value Ref Range   Ammonia 71 (H) 9 - 35 umol/L    Comment: Performed at [facility], [address] [place], [place] [address]     Blood Alcohol level:  Lab Results  Component Value Date   [facility] <10 07/14/2023   ETH <10 01/26/2023    Metabolic  Disorder Labs: Lab Results  Component Value Date   HGBA1C 5.6 07/17/2023   MPG 114.02 07/17/2023   MPG 111.15 01/30/2023   Lab Results  Component Value Date   PROLACTIN 40.3 (H) 12/06/2022   Lab Results  Component Value Date   CHOL 199 07/17/2023   TRIG 193 (H) 07/17/2023   HDL 59 07/17/2023   CHOLHDL 3.4 07/17/2023   VLDL 39 07/17/2023   LDLCALC 101 (H) 07/17/2023   LDLCALC 91 01/30/2023    Physical Findings: AIMS:  , ,  ,  ,    CIWA:    COWS:     Musculoskeletal: Strength & Muscle Tone: within normal limits Gait & Station: normal Patient leans: N/A  Psychiatric Specialty Exam:  Presentation  General Appearance:  Casual  Eye Contact: Fair  Speech: Clear and Coherent  Speech Volume: Normal  Handedness: Right   Mood and Affect  Mood: Anxious; Labile  Affect: Appropriate; Labile   Thought Process  Thought Processes: Coherent  Descriptions of Associations:Intact  Orientation:Full (Time, Place and Person)  Thought Content:Perseveration  History of Schizophrenia/Schizoaffective disorder:Yes  Duration of Psychotic Symptoms:Greater than six months  Hallucinations:Hallucinations: None  Ideas of Reference:None  Suicidal Thoughts:Suicidal Thoughts: No  Homicidal Thoughts:Homicidal Thoughts: No   Sensorium  Memory: Immediate Fair; Recent Fair; Remote Fair  Judgment: Fair  Insight: Fair   Art therapist  Concentration: Fair  Attention Span: Fair  Recall: [employer] of Knowledge: Fair  Language: Fair   Psychomotor Activity  Psychomotor Activity: Psychomotor Activity: Normal   Assets  Assets: Communication Skills; Desire for Improvement; Housing   Sleep  Sleep: Sleep: Good Number of Hours of Sleep: 8    Physical Exam: Physical Exam Vitals and nursing note reviewed.  Constitutional:      General: He is not in acute distress.    Appearance: Normal appearance. He is normal weight. He is not  ill-appearing or toxic-appearing.  HENT:     Head: Normocephalic and atraumatic.  Pulmonary:     Effort: Pulmonary effort is normal.  Musculoskeletal:        General: Normal range of motion.  Neurological:     General: No focal deficit present.     Mental Status: He is alert.    Review of Systems  Respiratory:  Negative for cough and shortness of breath.   Cardiovascular:  Negative for chest pain.  Gastrointestinal:  Negative for  abdominal pain, constipation, diarrhea, nausea and vomiting.  Neurological:  Negative for dizziness, weakness and headaches.  Psychiatric/Behavioral:  Negative for depression, hallucinations and suicidal ideas. The patient is not nervous/anxious.    Blood pressure 117/72, pulse 79, temperature 98 F (36.7 C), temperature source Oral, resp. rate 12, height 5\' 11"  (1.803 m), weight 87.1 kg, SpO2 100%. Body mass index is 26.78 kg/m.   Treatment Plan Summary: Daily contact with patient to assess and evaluate symptoms and progress in treatment and Medication management  Alfonzia E Garvey is a 27 yr old male who presented on 10/21 to [facility] via GPD under IVC for paranoia and bizarre behavior, he was admitted to [facility] on 10/24.  PPHx is significant for Schizoaffective Disorder, Bipolar Type, PTSD, ADHD, and GAD, and no Suicide Attempts, Self Injurious Behavior, and no Prior Psychiatric Hospitalizations, he does have an ACT Team- PSI.  PMHx is significant for Seizures.   Glenn has remained stable from a psychiatric standpoint with the decrease in his Depakote.  His Ammonia lvl was rechecked and it has resulted as even higher at 94.  Due to this we will increase his Lactulose and will redraw his Ammonia tomorrow morning.  We will not make any other changes to his medications at this time.  We will continue to monitor.   Schizoaffective Disorder, Bipolar Type: -Continue Invega 3 mg BID for psychosis and mood stability -Continue Depakote DR 250 mg AM and 500 mg PM for  mood stability -Continue Agitation Protocol: Haldol/Ativan/Benadryl   Seizures: -Continue Eslicarbazepine Acetate 2 tablets daily -Continue Zonegran 300 mg daily -Continue Ativan 2 mg IM daily PRN   Hyperammonemia: -Ammonia lvl: down to 71 today - Lactulose to 30 g BID -Recheck Ammonia lvl tomorrow morning and if it continues to trend downward, patient could be considered for discharge.    Nicotine Dependence: -Continue Nicotine Patch 14 mg daily   -Continue Carvedilol 25 mg BID -Continue Levocarnitine 1000 mg BID -Continue Loratadine 10 mg daily -Continue PRN's: Tylenol, Maalox, Atarax, Milk of Magnesia, Trazodone   Rex Kras, MD 07/21/2023, 11:37 AM Patient ID: Gabriel Carina, male   DOB: [birth year], 27 y.o.   MRN: [id number]

## 2023-07-21 NOTE — [facility] Group Notes (Signed)
Adult Psychoeducational Group Note  Date:  07/21/2023 Time:  8:54 PM  Group Topic/Focus:  Wrap-Up Group:   The focus of this group is to help patients review their daily goal of treatment and discuss progress on daily workbooks.  Participation Level:  Active  Participation Quality:  Appropriate  Affect:  Appropriate  Cognitive:  Appropriate  Insight: Appropriate  Engagement in Group:  Engaged  Modes of Intervention:  Discussion  Additional Comments:   Pt states he had a good day but was disappointed because he assumed he would go home today. Pt went outside with peers and played basketball and also attended groups throughout the day. Pt talked to mother and gf on the phone and met with his doctor today. Pt denied everything   Vevelyn Pat 07/21/2023, 8:54 PM

## 2023-07-21 NOTE — [facility] Suicide Risk Assessment (Signed)
[facility] INPATIENT:  Family/Significant Other Suicide Prevention Education  Suicide Prevention Education:  Education Completed; Salvatore Marvel ( mom) [phone],  (name of family member/significant other) has been identified by the patient as the family member/significant other with whom the patient will be residing, and identified as the person(s) who will aid the patient in the event of a mental health crisis (suicidal ideations/suicide attempt).  With written consent from the patient, the family member/significant other has been provided the following suicide prevention education, prior to the and/or following the discharge of the patient.  CSW spoke with patient mom and completed safety planning. Mom shared that she did not have any safety concerns for patient or herself and that once patient is ready to DC he will return back to her home. Mom could not recall where patient last followed up at for his medication needs when CSW asked , but stated that she wanted to try a new provider and will get back with CSW once she make a few phone calls. Mom confirmed that there are no guns or weapons in the home nor does patient have access.   The suicide prevention education provided includes the following: Suicide risk factors Suicide prevention and interventions National Suicide Hotline telephone number [facility] assessment telephone number [facility] Emergency Assistance 911 [facility] and/or Residential Mobile Crisis Unit telephone number  Request made of family/significant other to: Remove weapons (e.g., guns, rifles, knives), all items previously/currently identified as safety concern.   Remove drugs/medications (over-the-counter, prescriptions, illicit drugs), all items previously/currently identified as a safety concern.  The family member/significant other verbalizes understanding of the suicide prevention education information provided.  The family member/significant other  agrees to remove the items of safety concern listed above.  Dylan Moon 07/21/2023, 4:25 PM

## 2023-07-21 NOTE — Progress Notes (Signed)
   07/21/23 2115  Psych Admission Type (Psych Patients Only)  Admission Status Involuntary  Psychosocial Assessment  Patient Complaints Anxiety  Eye Contact Fair  Facial Expression Anxious  Affect Anxious  Speech Slow;Soft  Interaction Assertive  Motor Activity Slow  Appearance/Hygiene Improved  Behavior Characteristics Cooperative;Anxious  Mood Anxious;Preoccupied;Pleasant  Aggressive Behavior  Effect No apparent injury  Thought Process  Coherency Circumstantial  Content Blaming others  Delusions WDL  Perception WDL  Hallucination None reported or observed  Judgment Impaired  Confusion None  Danger to Self  Current suicidal ideation? Denies  Danger to Others  Danger to Others None reported or observed

## 2023-07-21 NOTE — Group Note (Signed)
Recreation Therapy Group Note   Group Topic:Goal Setting  Group Date: 07/21/2023 Start Time: 1015 End Time: 1050 Facilitators: Tynika Luddy-McCall, LRT,CTRS Location: [address] Dayroom   Goal Area(s) Addresses:  Patient will effectively work with peer towards shared goal.  Patient will identify skills used to make activity successful.  Patient will identify how skills used during activity can be used to reach post d/c goals.   Intervention: STEM Activity  Group Description: Stage manager. In teams of 3-5, patients were given 12 plastic drinking straws and an equal length of masking tape. Using the materials provided, patients were asked to build a landing pad to catch a golf ball dropped from approximately 5 feet in the air. All materials were required to be used by the team in their design. LRT facilitated post-activity discussion.  Education: Pharmacist, community, Scientist, physiological, Discharge Planning   Education Outcome: Acknowledges education/In group clarification offered/Needs additional education.    Affect/Mood: Appropriate   Participation Level: Active   Participation Quality: Independent   Behavior: Appropriate   Speech/Thought Process: Focused   Insight: Good   Judgement: Good   Modes of Intervention: Worksheet   Patient Response to Interventions:  Receptive   Education Outcome:  In group clarification offered    Clinical Observations/Individualized Feedback: Pt was late to group but receptive to the activity. Pt has goals of sports photography/video graphy, movie creator/director and anything positive. Pt identified "people down fall" as the challenge he's facing. Pt expressed the way for him to achieve his goals in "stay away from people that hate and have a positive circle".     Plan: Continue to engage patient in RT group sessions 2-3x/week.   Alyssamae Klinck-McCall, LRT,CTRS 07/21/2023 1:08 PM

## 2023-07-21 NOTE — Group Note (Signed)
LCSW Group Therapy Note  Group Date: 07/21/2023 Start Time: 1300 End Time: 1400   Type of Therapy and Topic:  Group Therapy - How To Cope with Nervousness about Discharge   Participation Level:  Active   Description of Group This process group involved identification of patients' feelings about discharge. Some of them are scheduled to be discharged soon, while others are new admissions, but each of them was asked to share thoughts and feelings surrounding discharge from the hospital. One common theme was that they are excited at the prospect of going home, while another was that many of them are apprehensive about sharing why they were hospitalized. Patients were given the opportunity to discuss these feelings with their peers in preparation for discharge.  Therapeutic Goals  Patient will identify their overall feelings about pending discharge. Patient will think about how they might proactively address issues that they believe will once again arise once they get home (i.e. with parents). Patients will participate in discussion about having hope for change.   Summary of Patient Progress:  Patient came half way into group. Patient was very active throughout the session. Patient demonstrated good insight into the subject matter, and proved open to input from peers and feedback from CSW. Patient was upset about the doctor not seeing him and kept talking about how the doctor is running from him during group and delaying his DC. Patient was respectful of peers and participated throughout the entire session.   Therapeutic Modalities Cognitive Behavioral Therapy   Beather Arbour 07/21/2023  3:47 PM

## 2023-07-21 NOTE — Telephone Encounter (Signed)
Pt mother called at 430 today wanting to make a appointment for the pt. I do not have any paperwork stating PT mom can do so - I asked to speak with PT mom got frustrated , PT mom stated she filled out all the paperwork so she didn't know why I wouldn't just make the appointment. I explained that I did not have legal guardianship paperwork in order for her to make the appointment, she then told me she knows that nurses there and to please transfer me to the nurses. I did want PT mom requested and transferred her to nurses

## 2023-07-21 NOTE — Progress Notes (Addendum)
Pt denied SI/HI/AVH this morning. Pt preoccupied with wanting to discharge today. Pt has been cooperative throughout the shift. RN provided support and encouragement to patient. Pt given scheduled medications as prescribed. Q15 min checks verified for safety. Patient verbally contracts for safety. Patient compliant with medications and treatment plan. Patient is interacting well on the unit. Pt is safe on the unit.   07/21/23 0800  Psych Admission Type (Psych Patients Only)  Admission Status Involuntary  Psychosocial Assessment  Patient Complaints Anxiety  Eye Contact Fair  Facial Expression Animated;Anxious  Affect Anxious  Speech Soft;Slow  Interaction Assertive  Motor Activity Slow  Appearance/Hygiene Unremarkable  Behavior Characteristics Cooperative;Anxious  Mood Anxious;Preoccupied  Thought Process  Coherency Circumstantial  Content WDL  Delusions WDL  Perception WDL  Hallucination None reported or observed  Judgment Impaired  Confusion None  Danger to Self  Current suicidal ideation? Denies  Danger to Others  Danger to Others None reported or observed

## 2023-07-21 NOTE — Group Note (Signed)
Date:  07/21/2023 Time:  9:22 AM  Group Topic/Focus:  Goals Group:   The focus of this group is to help patients establish daily goals to achieve during treatment and discuss how the patient can incorporate goal setting into their daily lives to aide in recovery.    Participation Level:  Active  Participation Quality:  Appropriate  Affect:  Appropriate  Cognitive:  Appropriate  Insight: Appropriate  Engagement in Group:  Engaged  Modes of Intervention:  Discussion  Additional Comments:  Pt said he's hoping to go home today  Donell Beers 07/21/2023, 9:22 AM

## 2023-07-22 DIAGNOSIS — F312 Bipolar disorder, current episode manic severe with psychotic features: Secondary | ICD-10-CM | POA: Diagnosis not present

## 2023-07-22 NOTE — Progress Notes (Signed)
   07/22/23 2000  Psych Admission Type (Psych Patients Only)  Admission Status Involuntary  Psychosocial Assessment  Patient Complaints Anxiety;Worrying  Eye Contact Fair  Facial Expression Anxious  Affect Anxious  Speech Slow;Soft  Interaction Assertive  Motor Activity Slow  Appearance/Hygiene Improved  Behavior Characteristics Cooperative;Anxious  Mood Anxious;Preoccupied  Aggressive Behavior  Effect No apparent injury  Thought Process  Coherency Circumstantial  Content Blaming others  Delusions WDL  Perception WDL  Hallucination None reported or observed  Judgment Impaired  Confusion None  Danger to Self  Current suicidal ideation? Denies  Danger to Others  Danger to Others None reported or observed

## 2023-07-22 NOTE — Plan of Care (Signed)

## 2023-07-22 NOTE — Group Note (Signed)
Date:  07/22/2023 Time:  9:13 AM  Group Topic/Focus:  Goals Group:   The focus of this group is to help patients establish daily goals to achieve during treatment and discuss how the patient can incorporate goal setting into their daily lives to aide in recovery.    Participation Level:  Did Not Attend    Donell Beers 07/22/2023, 9:13 AM

## 2023-07-22 NOTE — Group Note (Signed)
Date:  07/22/2023 Time:  11:11 PM  Group Topic/Focus:  Wrap-Up Group:   The focus of this group is to help patients review their daily goal of treatment and discuss progress on daily workbooks.    Participation Level:  Active  Participation Quality:  Appropriate and Attentive  Affect:  Appropriate  Cognitive:  Alert and Appropriate  Insight: Appropriate and Good  Engagement in Group:  Engaged  Modes of Intervention:  Discussion and Education  Additional Comments:  Pt attended and participated in wrap up group this evening and rated their day an 8/10. Pt states that they went to groups and to all meals. Pt is anticipating D/C and hoping for their blood work levels to permit them to be discharged. Pt has no concerns at this time.   Chrisandra Netters 07/22/2023, 11:11 PM

## 2023-07-22 NOTE — Group Note (Signed)
Recreation Therapy Group Note   Group Topic:Team Building  Group Date: 07/22/2023 Start Time: 1010 End Time: 1035 Facilitators: Rane Dumm-McCall, LRT,CTRS Location: 500 Hall Dayroom   Group Topic: Communication, Team Building, Problem Solving  Goal Area(s) Addresses:  Patient will effectively work with peer towards shared goal.  Patient will identify skills used to make activity successful.  Patient will identify how skills used during activity can be used to reach post d/c goals.   Behavioral Response: Engaged, Attentive, Appropriate   Intervention: Teambuilding Activity  Group Description: [place]. Working in teams, patients were asked to use colored discs to get the entire team from one end of the hall way to the other. Patients were only allowed to move down and back the hallway by stepping on the discs, patient teams were provided 1 additional disc to assist with them completing task.    Education: Pharmacist, community, Scientist, physiological, Discharge Planning   Education Outcome: Acknowledges education.    Affect/Mood: N/A   Participation Level: Did not attend    Clinical Observations/Individualized Feedback:     Plan: Continue to engage patient in RT group sessions 2-3x/week.   Arriel Victor-McCall, LRT,CTRS  07/22/2023 11:40 AM

## 2023-07-22 NOTE — Plan of Care (Signed)
  Problem: Education: Goal: Emotional status will improve Outcome: Progressing Goal: Mental status will improve Outcome: Progressing   Problem: Activity: Goal: Interest or engagement in activities will improve Outcome: Progressing Goal: Sleeping patterns will improve Outcome: Progressing   Problem: Health Behavior/Discharge Planning: Goal: Identification of resources available to assist in meeting health care needs will improve Outcome: Progressing   Problem: Safety: Goal: Periods of time without injury will increase Outcome: Progressing

## 2023-07-22 NOTE — Progress Notes (Signed)
[facility] MD Progress Note  07/22/2023 1:36 PM Dylan Moon  MRN:  [id number] Subjective:   Dylan Moon is a 27 yr old male who presented on 10/21 to [facility] via [facility] under IVC for paranoia and bizarre behavior, he was admitted to [facility] on 10/24.  PPHx is significant for Schizoaffective Disorder, Bipolar Type, PTSD, ADHD, and GAD, and no Suicide Attempts, Self Injurious Behavior, and no Prior Psychiatric Hospitalizations, he does have an ACT Team- [facility].  PMHx is significant for Seizures.   Case was discussed in the multidisciplinary team. MAR was reviewed and patient was compliant with medications.  He received PRN Trazodone last night.  Staff reports he slept well.  No complaints noted.  No as needed medication needed or given for agitation or aggression.No reports of patient being psychotic or responding to stimuli or paranoid on the unit.  Ammonia level on 10/27 elevated at 94, on 10/28 trending down 71   Today patient was evaluated on the unit he continues to present linear and organized, alert and fully oriented to person place time and situation able to tell me that he was admitted because "my ammonia level was high I was confused" he reports feeling better over the past few days denies any paranoia or other delusions denies SI HI or AVH denies side effect to current medication regimen and agrees to comply after discharge.  I discussed with patient plan to recheck ammonia level tomorrow morning and if continuing to trend down constantly without fluctuation we will discharge him home for outpatient follow-up, he agrees.  He can continue taking the lactulose and levocarnitine at home.  Principal Problem: Bipolar affective disorder, current episode manic with psychotic symptoms (HCC) Diagnosis: Principal Problem:   Bipolar affective disorder, current episode manic with psychotic symptoms (HCC)  Total Time spent with patient:  I personally spent 35 minutes on the unit in direct patient care. The  direct patient care time included face-to-face time with the patient, reviewing the patient's chart, communicating with other professionals, and coordinating care. Greater than 50% of this time was spent in counseling or coordinating care with the patient regarding goals of hospitalization, psycho-education, and discharge planning needs.   Past Psychiatric History: Schizoaffective Disorder, Bipolar Type, PTSD, ADHD, and GAD, and no Suicide Attempts, Self Injurious Behavior, and no Prior Psychiatric Hospitalizations, he does have an ACT Team- [facility].  Past Medical History:  Past Medical History:  Diagnosis Date   ADHD    Bipolar 1 disorder (HCC)    Schizoaffective disorder (HCC)    Scoliosis    Seizures (HCC)    most recent 12/02/17    Past Surgical History:  Procedure Laterality Date   NO PAST SURGERIES     Family History:  Family History  Problem Relation Age of Onset   Diabetes Mother    Hypertension Mother    Cancer Other    Diabetes Father    Seizures Maternal Grandfather    Family Psychiatric  History: Unknown Social History:  Social History   Substance and Sexual Activity  Alcohol Use Not Currently     Social History   Substance and Sexual Activity  Drug Use Yes   Types: Marijuana   Comment: Daily use of marijuana.    Social History   Socioeconomic History   Marital status: Single    Spouse name: Not on file   Number of children: 0   Years of education: HS   Highest education level: Not on file  Occupational History  Occupation: Arboriculturist  Tobacco Use   Smoking status: Some Days    Types: Cigars   Smokeless tobacco: Never  Vaping Use   Vaping status: Never Used  Substance and Sexual Activity   Alcohol use: Not Currently   Drug use: Yes    Types: Marijuana    Comment: Daily use of marijuana.   Sexual activity: Not Currently  Other Topics Concern   Not on file  Social History Narrative   Lives at home his mother.   3-4 sodas  per week.   Right-handed.   Social Determinants of Health   Financial Resource Strain: Not on file  Food Insecurity: Food Insecurity Present (07/16/2023)   Hunger Vital Sign    Worried About Running Out of Food in the Last Year: Often true    Ran Out of Food in the Last Year: Sometimes true  Transportation Needs: No Transportation Needs (07/16/2023)   PRAPARE - Administrator, Civil Service (Medical): No    Lack of Transportation (Non-Medical): No  Physical Activity: Not on file  Stress: Not on file  Social Connections: Not on file   Additional Social History:                         Sleep: Good  Appetite:  Good  Current Medications: Current Facility-Administered Medications  Medication Dose Route Frequency Provider Last Rate Last Admin   acetaminophen (TYLENOL) tablet 650 mg  650 mg Oral Q6H PRN Lenox Ponds, NP       alum & mag hydroxide-simeth (MAALOX/MYLANTA) 200-200-20 MG/5ML suspension 30 mL  30 mL Oral Q4H PRN Lenox Ponds, NP       carvedilol (COREG) tablet 25 mg  25 mg Oral BID WC Lenox Ponds, NP   25 mg at 07/22/23 0751   diphenhydrAMINE (BENADRYL) capsule 50 mg  50 mg Oral TID PRN Lenox Ponds, NP       Or   diphenhydrAMINE (BENADRYL) injection 50 mg  50 mg Intramuscular TID PRN Lenox Ponds, NP       divalproex (DEPAKOTE) DR tablet 250 mg  250 mg Oral Q1400 [name], NP   250 mg at 07/22/23 0757   divalproex (DEPAKOTE) DR tablet 500 mg  500 mg Oral QHS Nkwenti, Doris, NP   500 mg at 07/21/23 2040   Eslicarbazepine Acetate TABS 2 tablet  2 tablet Oral Daily Lenox Ponds, NP   2 tablet at 07/22/23 0753   haloperidol (HALDOL) tablet 5 mg  5 mg Oral TID PRN Lenox Ponds, NP       Or   haloperidol lactate (HALDOL) injection 5 mg  5 mg Intramuscular TID PRN Lenox Ponds, NP       lactulose (CHRONULAC) 10 GM/15ML solution 30 g  30 g Oral BID Lauro Franklin, MD   30 g at 07/22/23 0750   levOCARNitine  (CARNITOR) 1 GM/10ML solution 1,000 mg  1,000 mg Oral BID Lenox Ponds, NP   1,000 mg at 07/22/23 0753   loratadine (CLARITIN) tablet 10 mg  10 mg Oral Daily Lenox Ponds, NP   10 mg at 07/22/23 4010   LORazepam (ATIVAN) tablet 2 mg  2 mg Oral TID PRN Lenox Ponds, NP       Or   LORazepam (ATIVAN) injection 2 mg  2 mg Intramuscular TID PRN Lenox Ponds, NP       LORazepam (  ATIVAN) injection 2 mg  2 mg Intramuscular Daily PRN [name], NP       magnesium hydroxide (MILK OF MAGNESIA) suspension 30 mL  30 mL Oral Daily PRN Lenox Ponds, NP       nicotine (NICODERM CQ - dosed in mg/24 hours) patch 14 mg  14 mg Transdermal Daily Sindy Guadeloupe, NP       paliperidone (INVEGA) 24 hr tablet 3 mg  3 mg Oral BID [name], NP   3 mg at 07/22/23 6063   traZODone (DESYREL) tablet 50 mg  50 mg Oral QHS PRN Lenox Ponds, NP   50 mg at 07/21/23 2040   zonisamide (ZONEGRAN) capsule 300 mg  300 mg Oral Daily Lenox Ponds, NP   300 mg at 07/22/23 1109    Lab Results:  Results for orders placed or performed during the hospital encounter of 07/16/23 (from the past 48 hour(s))  Ammonia     Status: Abnormal   Collection Time: 07/21/23  6:42 AM  Result Value Ref Range   Ammonia 71 (H) 9 - 35 umol/L    Comment: Performed at [facility], [address], [place], [place] [address]     Blood Alcohol level:  Lab Results  Component Value Date   [facility] <10 07/14/2023   ETH <10 01/26/2023    Metabolic Disorder Labs: Lab Results  Component Value Date   HGBA1C 5.6 07/17/2023   MPG 114.02 07/17/2023   MPG 111.15 01/30/2023   Lab Results  Component Value Date   PROLACTIN 40.3 (H) 12/06/2022   Lab Results  Component Value Date   CHOL 199 07/17/2023   TRIG 193 (H) 07/17/2023   HDL 59 07/17/2023   CHOLHDL 3.4 07/17/2023   VLDL 39 07/17/2023   LDLCALC 101 (H) 07/17/2023   LDLCALC 91 01/30/2023    Physical Findings: AIMS:  , ,  ,  ,    CIWA:     COWS:     Musculoskeletal: Strength & Muscle Tone: within normal limits Gait & Station: normal Patient leans: N/A  Psychiatric Specialty Exam:  Presentation  General Appearance:  Casual  Eye Contact: Fair  Speech: Clear and Coherent  Speech Volume: Normal  Handedness: Right   Mood and Affect  Mood: Anxious; Labile  Affect: Appropriate; Labile   Thought Process  Thought Processes: Coherent  Descriptions of Associations:Intact  Orientation:Full (Time, Place and Person)  Thought Content:Perseveration  History of Schizophrenia/Schizoaffective disorder:Yes  Duration of Psychotic Symptoms:Greater than six months  Hallucinations:Hallucinations: None  Ideas of Reference:None  Suicidal Thoughts:Suicidal Thoughts: No  Homicidal Thoughts:Homicidal Thoughts: No   Sensorium  Memory: Immediate Fair; Recent Fair; Remote Fair  Judgment: Fair  Insight: Fair   Art therapist  Concentration: Fair  Attention Span: Fair  Recall: [employer] of Knowledge: Fair  Language: Fair   Psychomotor Activity  Psychomotor Activity: Psychomotor Activity: Normal   Assets  Assets: Communication Skills; Desire for Improvement; Housing   Sleep  Sleep: Sleep: Good Number of Hours of Sleep: 8    Physical Exam: Physical Exam Vitals and nursing note reviewed.  Constitutional:      General: He is not in acute distress.    Appearance: Normal appearance. He is normal weight. He is not ill-appearing or toxic-appearing.  HENT:     Head: Normocephalic and atraumatic.  Pulmonary:     Effort: Pulmonary effort is normal.  Musculoskeletal:        General: Normal range of motion.  Neurological:     General: No focal deficit present.     Mental Status: He is alert.    Review of Systems  Respiratory:  Negative for cough and shortness of breath.   Cardiovascular:  Negative for chest pain.  Gastrointestinal:  Negative for abdominal pain,  constipation, diarrhea, nausea and vomiting.  Neurological:  Negative for dizziness, weakness and headaches.  Psychiatric/Behavioral:  Negative for depression, hallucinations and suicidal ideas. The patient is not nervous/anxious.   All other systems reviewed and are negative.  Blood pressure 139/82, pulse 73, temperature 97.6 F (36.4 C), temperature source Oral, resp. rate 20, height 5\' 11"  (1.803 m), weight 87.1 kg, SpO2 100%. Body mass index is 26.78 kg/m.   Treatment Plan Summary: Daily contact with patient to assess and evaluate symptoms and progress in treatment and Medication management  Dylan Moon is a 27 yr old male who presented on 10/21 to [facility] via [facility] under IVC for paranoia and bizarre behavior, he was admitted to [facility] on 10/24.  PPHx is significant for Schizoaffective Disorder, Bipolar Type, PTSD, ADHD, and GAD, and no Suicide Attempts, Self Injurious Behavior, and no Prior Psychiatric Hospitalizations, he does have an ACT Team- [facility].  PMHx is significant for Seizures.   Dylan Moon has remained stable from a psychiatric standpoint with the decrease in his Depakote.  His Ammonia lvl was rechecked and it has resulted as even higher at 94.  Due to this we will increase his Lactulose and will redraw his Ammonia tomorrow morning.  We will not make any other changes to his medications at this time.  We will continue to monitor.   Schizoaffective Disorder, Bipolar Type: -Continue Invega 3 mg BID for psychosis and mood stability -Continue Depakote DR 250 mg AM and 500 mg PM for mood stability -Continue Agitation Protocol: Haldol/Ativan/Benadryl   Seizures: -Continue Eslicarbazepine Acetate 2 tablets daily -Continue Zonegran 300 mg daily -Continue Ativan 2 mg IM daily PRN   Hyperammonemia: -Ammonia lvl: down to 71 today - Lactulose to 30 g BID -Recheck Ammonia lvl tomorrow morning and if it continues to trend downward, patient could be considered for discharge.    Nicotine  Dependence: -Continue Nicotine Patch 14 mg daily   -Continue Carvedilol 25 mg BID -Continue Levocarnitine 1000 mg BID -Continue Loratadine 10 mg daily -Continue PRN's: Tylenol, Maalox, Atarax, Milk of Magnesia, Trazodone   Yesika Rispoli Abbott Pao, MD 07/22/2023, 1:36 PM Patient ID: Gabriel Carina, male   DOB: 1996-06-26, 26 y.o.   MRN: [id number]

## 2023-07-23 ENCOUNTER — Encounter (HOSPITAL_COMMUNITY): Payer: Self-pay

## 2023-07-23 DIAGNOSIS — F312 Bipolar disorder, current episode manic severe with psychotic features: Secondary | ICD-10-CM | POA: Diagnosis not present

## 2023-07-23 LAB — AMMONIA: Ammonia: 76 umol/L — ABNORMAL HIGH (ref 9–35)

## 2023-07-23 MED ORDER — DIVALPROEX SODIUM 250 MG PO DR TAB: 250.0000 mg | DELAYED_RELEASE_TABLET | Freq: Two times a day (BID) | ORAL | 0 refills | Status: DC

## 2023-07-23 MED ORDER — DIVALPROEX SODIUM 250 MG PO DR TAB
250.0000 mg | DELAYED_RELEASE_TABLET | Freq: Two times a day (BID) | ORAL | Status: DC
  Filled 2023-07-23 (×4): qty 1

## 2023-07-23 MED ORDER — PALIPERIDONE ER 3 MG PO TB24: 3.0000 mg | ORAL_TABLET | Freq: Two times a day (BID) | ORAL | 0 refills | Status: DC

## 2023-07-23 MED ORDER — PALIPERIDONE PALMITATE ER 156 MG/ML IM SUSY: 156.0000 mg | PREFILLED_SYRINGE | INTRAMUSCULAR | 0 refills | Status: DC

## 2023-07-23 MED ORDER — CARVEDILOL 25 MG PO TABS: 25.0000 mg | ORAL_TABLET | Freq: Two times a day (BID) | ORAL | 0 refills | Status: DC

## 2023-07-23 MED ORDER — TRAZODONE HCL 50 MG PO TABS: 50.0000 mg | ORAL_TABLET | Freq: Every evening | ORAL | 0 refills | Status: DC | PRN

## 2023-07-23 MED ORDER — NICOTINE 14 MG/24HR TD PT24: 14.0000 mg | MEDICATED_PATCH | Freq: Every day | TRANSDERMAL | 0 refills | Status: DC

## 2023-07-23 MED ORDER — LACTULOSE 10 GM/15ML PO SOLN: 30.0000 g | Freq: Two times a day (BID) | ORAL | 0 refills | Status: DC

## 2023-07-23 NOTE — Group Note (Signed)
Recreation Therapy Group Note   Group Topic:Leisure Education  Group Date: 07/23/2023 Start Time: 1037 End Time: 1108 Facilitators: Kynlie Jane-McCall, LRT,CTRS Location: 500 Hall Dayroom   Group Topic: Leisure Education  Goal Area(s) Addresses:  Patient will identify positive leisure activities for use post discharge. Patient will identify at least one positive benefit of participation in leisure activities.   Intervention: Innovation, Group Presentation   Group Description: Keep It Contractor. Patients were placed in circle and given a beach ball. Patients were to hit the beach ball back and forth for as long as possible without it coming to a stop. While patients were hitting the ball, LRT kept timer of how long the ball was moving.  If the call came to a complete stop, the time would start over.  Education: Leisure Scientist, physiological, Special educational needs teacher, Teamwork, Discharge Planning  Education Outcome: Acknowledges education/In group clarification offered/Needs additional education.    Affect/Mood: Happy   Participation Level: Engaged   Participation Quality: Independent   Behavior: Appropriate   Speech/Thought Process: Focused   Insight: Good   Judgement: Good   Modes of Intervention: Cooperative Play   Patient Response to Interventions:  Engaged   Education Outcome:  In group clarification offered    Clinical Observations/Individualized Feedback: Pt was bright and engaged in activity. Pt was encouraging peers throughout activity. Pt was appropriate and focused throughout group session.      Plan: Continue to engage patient in RT group sessions 2-3x/week.   Jamille Yoshino-McCall, LRT,CTRS 07/23/2023 12:16 PM

## 2023-07-23 NOTE — Progress Notes (Signed)
D: Pt A & O X 3. Denies SI, HI, AVH and pain at this time. D/C home as ordered. Picked up in lobby by his mother. A: D/C instructions reviewed with pt including prescriptions and follow up appointment; compliance encouraged. All belongings from assigned locker returned to pt at time of departure. Scheduled medications given with verbal education and effects monitored. Safety checks maintained without incident till time of d/c.  R: Pt receptive to care. Compliant with medications when offered. Denies adverse drug reactions when assessed. Verbalized understanding related to d/c instructions. Signed belonging sheet in agreement with items received from locker. Ambulatory with a steady gait. Appears to be in no physical distress at time of departure.

## 2023-07-23 NOTE — Progress Notes (Signed)
  [facility] Adult Case Management Discharge Plan :  Will you be returning to the same living situation after discharge:  Yes,  patient will be returning back home with mom At discharge, do you have transportation home?: Yes,  patient mom will be here at 1:30 pm  Do you have the ability to pay for your medications: Yes,  Patient has [facility] Tailored Plan   Release of information consent forms completed and in the chart;  Patient's signature needed at discharge.  Patient to Follow up at:  Follow-up Information     Iona Hansen, NP Follow up on 07/29/2023.   Specialty: Nurse Practitioner Why: You have an appointment with your PCP on Tuesday 07/29/2023 @ 3:00pm in-person. Contact information: [address] [place] [address] [phone]         [facility] Follow up on 07/30/2023.   Why: You have a Medication management appointment with this provider on Wednesday 07/30/2023 @ 2:30 pm in person. I have also sent in an ACTT Referral on your behalf. Please follow up during appointment about referral. If you have any questions prior to your appointment, please call and ask for Genta. Contact information: [address] [place] [address] [phone]                 Next level of care provider has access to [facility]:no  Safety Planning and Suicide Prevention discussed: Yes,  Salvatore Marvel ( mom) [phone]     Has patient been referred to the Quitline?: Patient refused referral for treatment  Patient has been referred for addiction treatment: No known substance use disorder. Patient does smoke marijuana   Isabella Bowens, LCSWA 07/23/2023, 11:06 AM
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
## 2023-07-23 NOTE — BHH Counselor (Signed)
Adult Comprehensive Assessment  Patient ID: Dylan Moon, male   DOB: 1995-10-16, 27 y.o.   MRN: [id number]  Information Source: Information source: Patient  Current Stressors:  Patient states their primary concerns and needs for treatment are:: "I just got out of jail. " Patient states their goals for this hospitilization and ongoing recovery are:: "they said I need to be here." Educational / Learning stressors: denies stressors Employment / Job issues: Environmental manager Family Relationships: good relationship with mother Surveyor, quantity / Lack of resources (include bankruptcy): denies any stressors Housing / Lack of housing: reports that he lives with his girlfriend Physical health (include injuries & life threatening diseases): denies Social relationships: has a girlfriend Substance abuse: marijuana usage daily Bereavement / Loss: denies  Living/Environment/Situation:  Living Arrangements: Spouse/significant other Who else lives in the home?: girlfriend How long has patient lived in current situation?: 5 days  Family History:  Marital status: Single Are you sexually active?: Yes What is your sexual orientation?: heterosexual Has your sexual activity been affected by drugs, alcohol, medication, or emotional stress?: none Does patient have children?: Yes How many children?: 2 How is patient's relationship with their children?: good  Childhood History:  By whom was/is the patient raised?: Mother Description of patient's relationship with caregiver when they were a child: great How were you disciplined when you got in trouble as a child/adolescent?: "normally" Does patient have siblings?: Yes Number of Siblings: 2 Description of patient's current relationship with siblings: good Did patient suffer any verbal/emotional/physical/sexual abuse as a child?: No Has patient ever been sexually abused/assaulted/raped as an adolescent or adult?: No Witnessed domestic violence?: No Has patient  been affected by domestic violence as an adult?: No  Education:  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?: No  Financial Resources:   Financial resources: No income  Alcohol/Substance Abuse:   What has been your use of drugs/alcohol within the last 12 months?: marijuana If attempted suicide, did drugs/alcohol play a role in this?: No Alcohol/Substance Abuse Treatment Hx: Denies past history  Social Support System:   Forensic psychologist System: Fair Museum/gallery exhibitions officer System: receives med Proofreader:   Do You Have Hobbies?: Yes Leisure and Hobbies: rapping, music, photography  Strengths/Needs:   What is the patient's perception of their strengths?: i can use my photography stuff to get out the house. I just need someone to go with me Patient states they can use these personal strengths during their treatment to contribute to their recovery: do something good  Discharge Plan:   Currently receiving community mental health services: Yes (From Whom) Patient states concerns and preferences for aftercare planning are: therapy and med management Does patient have access to transportation?: No Does patient have financial barriers related to discharge medications?: No Patient description of barriers related to discharge medications: has insurance Plan for no access to transportation at discharge: CSW to monitor Will patient be returning to same living situation after discharge?: Yes  Summary/Recommendations:   Summary and Recommendations (to be completed by the evaluator): Dylan Moon is a 27 year old male that was admitted into [facility] on 07/16/23 for erratic  behavior and responding to internal stimuli.  Patient has a psychiatric history of schizoaffective disorder, bipolar type.  Patient states that he was brought to hospital by police due to patient requesting the police officer to shoot some people.  He reports that he has a supportive family.  He smokes marijuana on a daily basis.  He is connected to outpatient behavior health through [facility]. While here, Dylan Moon can benefit from crisis stabilization, medication management, therapeutic milieu, and referrals for services.  [name]. 07/19/2023

## 2023-07-23 NOTE — Progress Notes (Signed)
B[facility]/BMU LCSW Progress Note   07/23/2023    10:53 AM  Seanpaul E Froberg      Type of Note: ACT Team Referral    CSW faxed a referral to [facility] to get patient established with there ACT Team since he does not have one. Genta from [facility] stated that they will review referral and once patient comes to his medication management appointment on 07/30/2023 @ 2:30 pm they will get services start. CSW also shared this information with patient and mom about appointments and ACT services. CSW will continue to assist.     Signed:   Jacob Moores, MSW, [facility] 07/23/2023 10:53 AM
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
## 2023-07-23 NOTE — BH IP Treatment Plan (Signed)
Interdisciplinary Treatment and Diagnostic Plan Update  07/23/2023 Time of Session: 815a Dylan Moon MRN: [id number]  Principal Diagnosis: Bipolar affective disorder, current episode manic with psychotic symptoms (HCC)  Secondary Diagnoses: Principal Problem:   Bipolar affective disorder, current episode manic with psychotic symptoms (HCC)   Current Medications:  Current Facility-Administered Medications  Medication Dose Route Frequency Provider Last Rate Last Admin   acetaminophen (TYLENOL) tablet 650 mg  650 mg Oral Q6H PRN Lenox Ponds, NP       alum & mag hydroxide-simeth (MAALOX/MYLANTA) 200-200-20 MG/5ML suspension 30 mL  30 mL Oral Q4H PRN Lenox Ponds, NP       carvedilol (COREG) tablet 25 mg  25 mg Oral BID WC Lenox Ponds, NP   25 mg at 07/22/23 1647   diphenhydrAMINE (BENADRYL) capsule 50 mg  50 mg Oral TID PRN Lenox Ponds, NP       Or   diphenhydrAMINE (BENADRYL) injection 50 mg  50 mg Intramuscular TID PRN Lenox Ponds, NP       divalproex (DEPAKOTE) DR tablet 250 mg  250 mg Oral Q1400 [name], NP   250 mg at 07/22/23 0757   divalproex (DEPAKOTE) DR tablet 500 mg  500 mg Oral QHS [name], NP   500 mg at 07/22/23 2050   Eslicarbazepine Acetate TABS 2 tablet  2 tablet Oral Daily Lenox Ponds, NP   2 tablet at 07/22/23 0753   haloperidol (HALDOL) tablet 5 mg  5 mg Oral TID PRN Lenox Ponds, NP       Or   haloperidol lactate (HALDOL) injection 5 mg  5 mg Intramuscular TID PRN Lenox Ponds, NP       lactulose (CHRONULAC) 10 GM/15ML solution 30 g  30 g Oral BID Lauro Franklin, MD   30 g at 07/22/23 1419   levOCARNitine (CARNITOR) 1 GM/10ML solution 1,000 mg  1,000 mg Oral BID Lenox Ponds, NP   1,000 mg at 07/22/23 1645   loratadine (CLARITIN) tablet 10 mg  10 mg Oral Daily Lenox Ponds, NP   10 mg at 07/22/23 5621   LORazepam (ATIVAN) tablet 2 mg  2 mg Oral TID PRN Lenox Ponds, NP       Or   LORazepam  (ATIVAN) injection 2 mg  2 mg Intramuscular TID PRN Lenox Ponds, NP       LORazepam (ATIVAN) injection 2 mg  2 mg Intramuscular Daily PRN [name], NP       magnesium hydroxide (MILK OF MAGNESIA) suspension 30 mL  30 mL Oral Daily PRN Lenox Ponds, NP       nicotine (NICODERM CQ - dosed in mg/24 hours) patch 14 mg  14 mg Transdermal Daily Sindy Guadeloupe, NP       paliperidone (INVEGA) 24 hr tablet 3 mg  3 mg Oral BID [name], NP   3 mg at 07/22/23 2050   traZODone (DESYREL) tablet 50 mg  50 mg Oral QHS PRN Lenox Ponds, NP   50 mg at 07/22/23 2051   zonisamide (ZONEGRAN) capsule 300 mg  300 mg Oral Daily Lenox Ponds, NP   300 mg at 07/22/23 1109   PTA Medications: Medications Prior to Admission  Medication Sig Dispense Refill Last Dose   acetaminophen (TYLENOL) 500 MG tablet Take 1,000 mg by mouth every 6 (six) hours as needed for mild pain (pain score 1-3) or moderate pain (pain score 4-6).  carvedilol (COREG) 25 MG tablet Take 1 tablet (25 mg total) by mouth 2 (two) times daily with a meal. 60 tablet 0    divalproex (DEPAKOTE) 500 MG DR tablet Take 1 tablet (500 mg total) by mouth 2 (two) times daily. 60 tablet 0    escitalopram (LEXAPRO) 10 MG tablet Take 10 mg by mouth daily.      Eslicarbazepine Acetate (APTIOM) 800 MG TABS Take 1,600 mg by mouth daily. 60 tablet 0    famotidine (PEPCID) 20 MG tablet Take 20 mg by mouth daily.      hydrOXYzine (ATARAX) 25 MG tablet Take 25 mg by mouth 2 (two) times daily as needed for anxiety.      levOCARNitine (CARNITOR) 330 MG tablet Take 990 mg by mouth 2 (two) times daily.      loratadine (CLARITIN) 10 MG tablet Take 1 tablet (10 mg total) by mouth daily. (Patient taking differently: Take 10 mg by mouth daily as needed for allergies.) 30 tablet 0    paliperidone (INVEGA SUSTENNA) 156 MG/ML SUSY injection Inject 1 mL (156 mg total) into the muscle every 30 (thirty) days. 1 mL 0    traZODone (DESYREL) 50 MG tablet Take 1  tablet (50 mg total) by mouth at bedtime as needed for sleep. 30 tablet 0    zonisamide (ZONEGRAN) 100 MG capsule Take 3 capsules (300 mg total) by mouth daily. 90 capsule 0     Patient Stressors: Legal issue   Medication change or noncompliance   Substance abuse    Patient Strengths: General fund of knowledge  Motivation for treatment/growth  Supportive family/friends   Treatment Modalities: Medication Management, Group therapy, Case management,  1 to 1 session with clinician, Psychoeducation, Recreational therapy.   Physician Treatment Plan for Primary Diagnosis: Bipolar affective disorder, current episode manic with psychotic symptoms (HCC) Long Term Goal(s):     Short Term Goals:    Medication Management: Evaluate patient's response, side effects, and tolerance of medication regimen.  Therapeutic Interventions: 1 to 1 sessions, Unit Group sessions and Medication administration.  Evaluation of Outcomes: Progressing  Physician Treatment Plan for Secondary Diagnosis: Principal Problem:   Bipolar affective disorder, current episode manic with psychotic symptoms (HCC)  Long Term Goal(s):     Short Term Goals:       Medication Management: Evaluate patient's response, side effects, and tolerance of medication regimen.  Therapeutic Interventions: 1 to 1 sessions, Unit Group sessions and Medication administration.  Evaluation of Outcomes: Progressing   RN Treatment Plan for Primary Diagnosis: Bipolar affective disorder, current episode manic with psychotic symptoms (HCC) Long Term Goal(s): Knowledge of disease and therapeutic regimen to maintain health will improve  Short Term Goals: Ability to remain free from injury will improve, Ability to verbalize frustration and anger appropriately will improve, Ability to demonstrate self-control, Ability to participate in decision making will improve, Ability to verbalize feelings will improve, Ability to disclose and discuss suicidal  ideas, Ability to identify and develop effective coping behaviors will improve, and Compliance with prescribed medications will improve  Medication Management: RN will administer medications as ordered by provider, will assess and evaluate patient's response and provide education to patient for prescribed medication. RN will report any adverse and/or side effects to prescribing provider.  Therapeutic Interventions: 1 on 1 counseling sessions, Psychoeducation, Medication administration, Evaluate responses to treatment, Monitor vital signs and CBGs as ordered, Perform/monitor CIWA, COWS, AIMS and Fall Risk screenings as ordered, Perform wound care treatments as ordered.  Evaluation of  Outcomes: Progressing   LCSW Treatment Plan for Primary Diagnosis: Bipolar affective disorder, current episode manic with psychotic symptoms (HCC) Long Term Goal(s): Safe transition to appropriate next level of care at discharge, Engage patient in therapeutic group addressing interpersonal concerns.  Short Term Goals: Engage patient in aftercare planning with referrals and resources, Increase social support, Increase ability to appropriately verbalize feelings, Increase emotional regulation, Facilitate acceptance of mental health diagnosis and concerns, Facilitate patient progression through stages of change regarding substance use diagnoses and concerns, Identify triggers associated with mental health/substance abuse issues, and Increase skills for wellness and recovery  Therapeutic Interventions: Assess for all discharge needs, 1 to 1 time with Social worker, Explore available resources and support systems, Assess for adequacy in community support network, Educate family and significant other(s) on suicide prevention, Complete Psychosocial Assessment, Interpersonal group therapy.  Evaluation of Outcomes: Progressing   Progress in Treatment: Attending groups: Yes. Participating in groups: Yes. Taking medication as  prescribed: Yes. Toleration medication: Yes. Family/Significant other contact made: Yes, individual(s) contacted:   Salvatore Marvel ( mom) [phone] Patient understands diagnosis: Yes. Discussing patient identified problems/goals with staff: Yes. Medical problems stabilized or resolved: Yes. Denies suicidal/homicidal ideation: Yes. Issues/concerns per patient self-inventory: Yes. Other: N/A  New problem(s) identified: No, Describe:  None Reported  New Short Term/Long Term Goal(s): stabilization, elimination of SI thoughts, development of comprehensive mental wellness plan.  medication     Patient Goals:  Medication Stabilization  Discharge Plan or Barriers: CSW will continue to follow and assess for appropriate referrals and possible discharge planning.   Reason for Continuation of Hospitalization: Delusions  Depression Hallucinations Mania Medication stabilization Withdrawal symptoms  Estimated Length of Stay: 5-7 Days  Last 3 [place] Suicide Severity Risk Score: Flowsheet Row [facility] (Current[facility] Most recent reading at 07/16/2023  8:05 PM ED from 07/14/2023 in [facility] Emergency Department at [facility] Most recent reading at 07/14/2023  2:31 PM ED from 07/14/2023 in [facility] Most recent reading at 07/14/2023 12:54 PM  C-SSRS RISK CATEGORY No Risk No Risk No Risk       Last PHQ 2/9 Scores:    11/13/2021   10:45 AM 06/01/2021   11:43 AM 04/13/2021   12:26 PM  Depression screen PHQ 2/9  Decreased Interest 1 0 0  Down, Depressed, Hopeless 0 0 0  PHQ - 2 Score 1 0 0   detox, medication management for mood stabilization; elimination of SI thoughts; development of comprehensive mental wellness/sobriety plan

## 2023-08-06 ENCOUNTER — Encounter (HOSPITAL_COMMUNITY): Payer: Self-pay

## 2023-08-06 ENCOUNTER — Ambulatory Visit (INDEPENDENT_AMBULATORY_CARE_PROVIDER_SITE_OTHER): Payer: MEDICAID | Admitting: Physician Assistant

## 2023-08-06 ENCOUNTER — Ambulatory Visit (HOSPITAL_COMMUNITY): Payer: MEDICAID

## 2023-08-06 VITALS — BP 131/76 | HR 75 | Temp 98.2°F | Ht 71.0 in | Wt 207.4 lb

## 2023-08-06 DIAGNOSIS — R112 Nausea with vomiting, unspecified: Secondary | ICD-10-CM | POA: Diagnosis not present

## 2023-08-06 DIAGNOSIS — F5105 Insomnia due to other mental disorder: Secondary | ICD-10-CM

## 2023-08-06 DIAGNOSIS — Z8679 Personal history of other diseases of the circulatory system: Secondary | ICD-10-CM

## 2023-08-06 DIAGNOSIS — F99 Mental disorder, not otherwise specified: Secondary | ICD-10-CM

## 2023-08-06 DIAGNOSIS — F312 Bipolar disorder, current episode manic severe with psychotic features: Secondary | ICD-10-CM | POA: Diagnosis not present

## 2023-08-06 DIAGNOSIS — R7989 Other specified abnormal findings of blood chemistry: Secondary | ICD-10-CM | POA: Insufficient documentation

## 2023-08-06 MED ORDER — PROMETHAZINE HCL 6.25 MG/5ML PO SOLN: 6.2500 mg | Freq: Four times a day (QID) | ORAL | 0 refills | Status: DC | PRN

## 2023-08-06 MED ORDER — CARVEDILOL 25 MG PO TABS
25.0000 mg | ORAL_TABLET | Freq: Two times a day (BID) | ORAL | 0 refills | Status: DC
Start: 2023-08-06 — End: 2024-02-16

## 2023-08-06 MED ORDER — TRAZODONE HCL 50 MG PO TABS: 50.0000 mg | ORAL_TABLET | Freq: Every evening | ORAL | 1 refills | Status: DC | PRN

## 2023-08-06 MED ORDER — PALIPERIDONE ER 3 MG PO TB24: 3.0000 mg | ORAL_TABLET | Freq: Two times a day (BID) | ORAL | 1 refills | Status: DC

## 2023-08-06 MED ORDER — DIVALPROEX SODIUM 500 MG PO DR TAB: 500.0000 mg | DELAYED_RELEASE_TABLET | Freq: Two times a day (BID) | ORAL | 1 refills | Status: DC
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
## 2023-08-07 ENCOUNTER — Ambulatory Visit (INDEPENDENT_AMBULATORY_CARE_PROVIDER_SITE_OTHER): Payer: MEDICAID

## 2023-08-07 ENCOUNTER — Encounter (HOSPITAL_COMMUNITY): Payer: Self-pay

## 2023-08-07 VITALS — BP 132/77 | HR 66 | Ht 71.0 in | Wt 208.2 lb

## 2023-08-07 DIAGNOSIS — F411 Generalized anxiety disorder: Secondary | ICD-10-CM

## 2023-08-07 DIAGNOSIS — G4700 Insomnia, unspecified: Secondary | ICD-10-CM | POA: Diagnosis not present

## 2023-08-07 DIAGNOSIS — F200 Paranoid schizophrenia: Secondary | ICD-10-CM

## 2023-08-07 MED ORDER — PALIPERIDONE PALMITATE ER 156 MG/ML IM SUSY
156.0000 mg | PREFILLED_SYRINGE | Freq: Once | INTRAMUSCULAR | Status: AC
  Administered 2023-08-07: 156 mg via INTRAMUSCULAR

## 2023-08-07 NOTE — Progress Notes (Cosign Needed)
Patient presented to the office for [facility] injection 156 injection . Pt tolerated injection well and will return in 28 days, injection was given in Right deltoid.

## 2023-08-09 ENCOUNTER — Encounter (HOSPITAL_COMMUNITY): Payer: Self-pay | Admitting: Physician Assistant

## 2023-08-12 ENCOUNTER — Telehealth (HOSPITAL_COMMUNITY): Payer: Self-pay | Admitting: Physician Assistant

## 2023-08-12 NOTE — Telephone Encounter (Signed)
Provider to reach out to patient's mother regarding medication inquiry.

## 2023-08-18 ENCOUNTER — Other Ambulatory Visit (HOSPITAL_COMMUNITY): Payer: Self-pay

## 2023-08-20 NOTE — Telephone Encounter (Signed)
Provider spoke with patient's mother regarding concerns of the patient's use of Invega pills. Provider informed patient's mother to discontinue the patient's morning dose and Invega and to continue taking the night time dose. Patient's mother vocalized understanding.

## 2023-08-25 ENCOUNTER — Other Ambulatory Visit: Payer: Self-pay

## 2023-08-25 ENCOUNTER — Emergency Department (HOSPITAL_COMMUNITY)
Admission: EM | Admit: 2023-08-25 | Discharge: 2023-08-26 | Disposition: A | Discharge status: Other Acute Inpatient Hospital | Payer: MEDICAID | Attending: Emergency Medicine | Admitting: Emergency Medicine

## 2023-08-25 ENCOUNTER — Encounter (HOSPITAL_COMMUNITY): Payer: Self-pay

## 2023-08-25 DIAGNOSIS — Z046 Encounter for general psychiatric examination, requested by authority: Secondary | ICD-10-CM

## 2023-08-25 DIAGNOSIS — Z79899 Other long term (current) drug therapy: Secondary | ICD-10-CM | POA: Diagnosis not present

## 2023-08-25 DIAGNOSIS — F17290 Nicotine dependence, other tobacco product, uncomplicated: Secondary | ICD-10-CM | POA: Diagnosis not present

## 2023-08-25 DIAGNOSIS — F259 Schizoaffective disorder, unspecified: Secondary | ICD-10-CM | POA: Diagnosis not present

## 2023-08-25 DIAGNOSIS — F22 Delusional disorders: Secondary | ICD-10-CM | POA: Diagnosis present

## 2023-08-25 DIAGNOSIS — Z8659 Personal history of other mental and behavioral disorders: Secondary | ICD-10-CM | POA: Insufficient documentation

## 2023-08-25 DIAGNOSIS — Z653 Problems related to other legal circumstances: Secondary | ICD-10-CM | POA: Diagnosis not present

## 2023-08-25 LAB — CBC WITH DIFFERENTIAL/PLATELET
Abs Immature Granulocytes: 0.04 10*3/uL (ref 0.00–0.07)
Basophils Absolute: 0 10*3/uL (ref 0.0–0.1)
Basophils Relative: 1 %
Eosinophils Absolute: 0 10*3/uL (ref 0.0–0.5)
Eosinophils Relative: 0 %
HCT: 44.1 % (ref 39.0–52.0)
Hemoglobin: 14.7 g/dL (ref 13.0–17.0)
Immature Granulocytes: 1 %
Lymphocytes Relative: 21 %
Lymphs Abs: 1.7 10*3/uL (ref 0.7–4.0)
MCH: 28.3 pg (ref 26.0–34.0)
MCHC: 33.3 g/dL (ref 30.0–36.0)
MCV: 85 fL (ref 80.0–100.0)
Monocytes Absolute: 0.7 10*3/uL (ref 0.1–1.0)
Monocytes Relative: 9 %
Neutro Abs: 5.6 10*3/uL (ref 1.7–7.7)
Neutrophils Relative %: 68 %
Platelets: 239 10*3/uL (ref 150–400)
RBC: 5.19 MIL/uL (ref 4.22–5.81)
RDW: 13.5 % (ref 11.5–15.5)
WBC: 8.1 10*3/uL (ref 4.0–10.5)
nRBC: 0 % (ref 0.0–0.2)

## 2023-08-25 LAB — COMPREHENSIVE METABOLIC PANEL
ALT: 24 U/L (ref 0–44)
AST: 23 U/L (ref 15–41)
Albumin: 4.9 g/dL (ref 3.5–5.0)
Alkaline Phosphatase: 46 U/L (ref 38–126)
Anion gap: 7 (ref 5–15)
BUN: 16 mg/dL (ref 6–20)
CO2: 23 mmol/L (ref 22–32)
Calcium: 9.4 mg/dL (ref 8.9–10.3)
Chloride: 105 mmol/L (ref 98–111)
Creatinine, Ser: 0.95 mg/dL (ref 0.61–1.24)
GFR, Estimated: >60 mL/min (ref >60)
Glucose, Bld: 99 mg/dL (ref 70–99)
Potassium: 3.9 mmol/L (ref 3.5–5.1)
Sodium: 135 mmol/L (ref 135–145)
Total Bilirubin: 0.5 mg/dL (ref <1.2)
Total Protein: 8 g/dL (ref 6.5–8.1)

## 2023-08-25 LAB — RAPID URINE DRUG SCREEN, HOSP PERFORMED
Amphetamines: NOT DETECTED
Barbiturates: NOT DETECTED
Benzodiazepines: NOT DETECTED
Cocaine: NOT DETECTED
Opiates: NOT DETECTED
Tetrahydrocannabinol: POSITIVE — AB

## 2023-08-25 MED ORDER — LEVOCARNITINE 1 GM/10ML PO SOLN
1000.0000 mg | Freq: Two times a day (BID) | ORAL | Status: DC
  Administered 2023-08-26: 1000 mg via ORAL
  Filled 2023-08-25 (×2): qty 10

## 2023-08-25 MED ORDER — DIVALPROEX SODIUM 500 MG PO DR TAB
500.0000 mg | DELAYED_RELEASE_TABLET | Freq: Two times a day (BID) | ORAL | Status: DC
  Administered 2023-08-26: 500 mg via ORAL
  Filled 2023-08-25: qty 1

## 2023-08-25 MED ORDER — ZONISAMIDE 100 MG PO CAPS
300.0000 mg | ORAL_CAPSULE | Freq: Every day | ORAL | Status: DC
  Administered 2023-08-26: 300 mg via ORAL
  Filled 2023-08-25: qty 3

## 2023-08-25 MED ORDER — ESCITALOPRAM OXALATE 10 MG PO TABS
10.0000 mg | ORAL_TABLET | Freq: Every day | ORAL | Status: DC
  Administered 2023-08-26: 10 mg via ORAL
  Filled 2023-08-25: qty 1

## 2023-08-25 MED ORDER — CARVEDILOL 12.5 MG PO TABS: 25.0000 mg | ORAL_TABLET | Freq: Two times a day (BID) | ORAL | Status: DC | PRN

## 2023-08-25 MED ORDER — TRAZODONE HCL 50 MG PO TABS: 50.0000 mg | ORAL_TABLET | Freq: Every evening | ORAL | Status: DC | PRN

## 2023-08-25 MED ORDER — PALIPERIDONE ER 3 MG PO TB24
3.0000 mg | ORAL_TABLET | Freq: Every day | ORAL | Status: DC
Start: 2023-08-25 — End: 2023-08-26
  Filled 2023-08-25: qty 1

## 2023-08-25 MED ORDER — ESLICARBAZEPINE ACETATE 800 MG PO TABS
1600.0000 mg | ORAL_TABLET | Freq: Every day | ORAL | Status: DC
  Filled 2023-08-25: qty 2

## 2023-08-25 NOTE — ED Notes (Signed)
Pt has received 2x sprite, 2x graham crackers, 2x peanut butter, and a [place] sandwich.
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
## 2023-08-25 NOTE — ED Provider Notes (Signed)
[place] [facility]ERGENC[facility] DEPARTMENT AT [facility] Provider Note   CSN: [id number] Arrival date & time: 08/25/23  1525     History  Chief Complaint  Patient presents with   IVC   Paranoid   Erratic Behavior    Dylan Moon is a 27 y.o. male.  The history is provided by the patient and medical records (IVC paperwork). No language interpreter was used.  Mental Health Problem Presenting symptoms: aggressive behavior, bizarre behavior, delusional and paranoid behavior   Presenting symptoms: no agitation, no suicidal thoughts, no suicidal threats and no suicide attempt   Degree of incapacity (severity):  Moderate Onset quality:  Unable to specify Timing:  Constant Progression:  Resolved Chronicity:  Recurrent Context: drug abuse   Associated symptoms: no abdominal pain, no chest pain, no fatigue and no headaches   Risk factors: hx of mental illness        Home Medications Prior to Admission medications   Medication Sig Start Date End Date Taking? Authorizing Provider  carvedilol (COREG) 25 MG tablet Take 1 tablet (25 mg total) by mouth 2 (two) times daily with a meal. 08/06/23   Nwoko, Stephens Shire E, PA  divalproex (DEPAKOTE) 500 MG DR tablet Take 1 tablet (500 mg total) by mouth every 12 (twelve) hours. 08/06/23   Nwoko, Tommas Olp, PA  Eslicarbazepine Acetate (APTIOM) 800 MG TABS Take 1,600 mg by mouth daily. 02/05/23   [name], NP  lactulose (CHRONULAC) 10 GM/15ML solution Take 45 mLs (30 g total) by mouth 2 (two) times daily. 07/23/23   Sarita Bottom, MD  levOCARNitine (CARNITOR) 330 MG tablet Take 990 mg by mouth 2 (two) times daily. 05/14/23   [provider]  loratadine (CLARITIN) 10 MG tablet Take 1 tablet (10 mg total) by mouth daily. Patient taking differently: Take 10 mg by mouth daily as needed for allergies. 02/05/23   Nkwenti, Dylan Aas, NP  nicotine (NICODERM CQ - DOSED IN MG/24 HOURS) 14 mg/24hr patch Place 1 patch (14 mg total) onto the skin  daily. 07/23/23   Sarita Bottom, MD  paliperidone (INVEGA SUSTENNA) 156 MG/ML SUSY injection Inject 1 mL (156 mg total) into the muscle every 30 (thirty) days. 07/23/23   Sarita Bottom, MD  paliperidone (INVEGA) 3 MG 24 hr tablet Take 1 tablet (3 mg total) by mouth 2 (two) times daily. 08/06/23   Nwoko, Tommas Olp, PA  promethazine (PHENERGAN) 6.25 MG/5ML solution Take 5 mLs (6.25 mg total) by mouth every 6 (six) hours as needed for nausea or vomiting. 08/06/23   Nwoko, Tommas Olp, PA  traZODone (DESYREL) 50 MG tablet Take 1 tablet (50 mg total) by mouth at bedtime as needed for sleep. 08/06/23   Nwoko, Tommas Olp, PA  zonisamide (ZONEGRAN) 100 MG capsule Take 3 capsules (300 mg total) by mouth daily. 02/04/23   [name], NP      Allergies    Keppra [levetiracetam], Tramadol, and Vimpat [lacosamide]    Review of Systems   Review of Systems  Constitutional:  Negative for chills, fatigue and fever.  HENT:  Negative for congestion.   Respiratory:  Negative for cough, chest tightness, shortness of breath and wheezing.   Cardiovascular:  Negative for chest pain.  Gastrointestinal:  Negative for abdominal pain, constipation, diarrhea, nausea and vomiting.  Genitourinary:  Negative for dysuria and flank pain.  Musculoskeletal:  Negative for back pain and neck pain.  Neurological:  Negative for weakness, light-headedness, numbness and headaches.  Psychiatric/Behavioral:  Positive for paranoia. Negative  for agitation and suicidal ideas.     Physical Exam Updated Vital Signs BP (!) 145/83   Pulse 86   Temp 98.5 F (36.9 C) (Oral)   Resp 16   SpO2 99%  Physical Exam Vitals and nursing note reviewed.  Constitutional:      General: He is not in acute distress.    Appearance: He is well-developed. He is not ill-appearing, toxic-appearing or diaphoretic.  HENT:     Head: Normocephalic and atraumatic.     Nose: No congestion or rhinorrhea.     Mouth/Throat:     Mouth: Mucous membranes are  moist.  Eyes:     Conjunctiva/sclera: Conjunctivae normal.  Cardiovascular:     Rate and Rhythm: Normal rate and regular rhythm.     Heart sounds: No murmur heard. Pulmonary:     Effort: Pulmonary effort is normal. No respiratory distress.     Breath sounds: Normal breath sounds. No wheezing, rhonchi or rales.  Chest:     Chest wall: No tenderness.  Abdominal:     General: Abdomen is flat. There is no distension.     Palpations: Abdomen is soft.     Tenderness: There is no abdominal tenderness. There is no right CVA tenderness, left CVA tenderness, guarding or rebound.  Musculoskeletal:        General: No swelling or tenderness.     Cervical back: Neck supple. No tenderness.  Skin:    General: Skin is warm and dry.     Capillary Refill: Capillary refill takes less than 2 seconds.     Findings: No erythema or rash.  Neurological:     General: No focal deficit present.     Mental Status: He is alert.  Psychiatric:        Attention and Perception: Perception normal.        Behavior: Behavior is not agitated.        Thought Content: Thought content is paranoid (per IVC, now denying). Thought content does not include homicidal or suicidal ideation.     ED Results / Procedures / Treatments   Labs (all labs ordered are listed, but only abnormal results are displayed) Labs Reviewed  CBC WITH DIFFERENTIAL/PLATELET  COMPREHENSIVE METABOLIC PANEL  RAPID URINE DRUG SCREEN, HOSP PERFORMED    EKG None  Radiology No results found.  Procedures Procedures    Medications Ordered in ED Medications - No data to display  ED Course/ Medical Decision Making/ A&P                                 Medical Decision Making Amount and/or Complexity of Data Reviewed Labs: ordered.  Risk Prescription drug management.    Dylan Moon is a 27 y.o. male With a past medical history significant for marijuana use, seizures, bipolar disorder, ADHD, and schizoaffective disorder who  presents under IVC for paranoid behavior and dangerous behavior such as throwing objects at others.  According to IVC paperwork, patient barricaded himself in another person inside the house because he was concerned people are out to get them and kill them.  He reportedly threw a phone and another person and was throwing object inside the home.  They signed the patient was recently under IVC and For 15 days in October.  He reportedly had law enforcement called out due to the safety concern today and he was placed under IVC.  According to patient, he did smokes  marijuana today and then saw a car parked outside his house and he was worried someone was out to get him.  He reports that the car then drove her right before law enforcement arrived and he thinks he is not as paranoid as he was in the past.  He thinks it was just a slight panic attack and is feeling better now.  He denies SI and HI to me and denies hallucinations.  He does not drink alcohol and did reports marijuana use today.  On my exam, lungs clear.  Chest nontender.  Abdomen nontender.  Patient moving all extremities.  Patient answering questions appropriately and is pleasantly sitting eating a snack.  No evidence of acute trauma or injuries at this time.  Will hold IVC given his history and given this dangerous behavior by throwing objects while barricading himself and others inside home while acting paranoid.  Anticipate when his labs are completed he will be medically clear and then will we will await psychiatric evaluation and recommendations.  5:08 PM Labs have returned reassuring as expected.  THC is positive as he reported marijuana use today.  He is felt to be medically clear for psychiatric evaluation and management.  Psychiatry recommended holding IVC.  Will order home meds and he will await further psychiatric recommendations.        Final Clinical Impression(s) / ED Diagnoses Final diagnoses:  Involuntary commitment   Paranoia (HCC)    Clinical Impression: 1. Involuntary commitment   2. Paranoia (HCC)     Disposition: Awaiting further psychiatric  This note was prepared with assistance of Dragon voice recognition software. Occasional wrong-word or sound-a-like substitutions may have occurred due to the inherent limitations of voice recognition software.      Kamdyn Colborn, Canary Brim, MD 08/25/23 [phone]

## 2023-08-25 NOTE — Consult Note (Signed)
[facility] Consult Note  Patient Name: Dylan Moon MRN: [id number] DOB: 24-Jan-1996 DATE OF Consult: 08/25/2023  PRIMARY PSYCHIATRIC DIAGNOSES  1.  Schizoaffective Disorder 2.  ADHD by hx 3.    RECOMMENDATIONS  Recommendations: Medication recommendations: Resume home medications, Zyprexa 10 mg PO/IM BID PRN for agitation/aggression Non-Medication/therapeutic recommendations: Inpatient psychiatric admission Is inpatient psychiatric hospitalization recommended for this patient? Yes (Explain why): Potential harm to self and/or others Is another care setting recommended for this patient? (examples may include Crisis Stabilization Unit, Residential/Recovery Treatment, ALF/SNF, Memory Care Unit)  No (Explain why):   From a psychiatric perspective, is this patient appropriate for discharge to an outpatient setting/resource or other less restrictive environment for continued care?  No (Explain why): Potential harm to self and/or others Follow-Up Telepsychiatry C/L services: We will sign off for now. Please re-consult our service if needed for any concerning changes in the patient's condition, discharge planning, or questions. Communication: Treatment team members (and family members if applicable) who were involved in treatment/care discussions and planning, and with whom we spoke or engaged with via secure text/chat, include the following: Dr. Rush Landmark; Juliette Alcide, EMT; [place]; Karle Starch  Thank you for involving [place] in the care of this patient. If you have any additional questions or concerns, please call [phone] and ask for me or the provider on-call.  TELEPSYCHIATRY ATTESTATION & CONSENT  As the provider for this telehealth consult, I attest that I verified the patient's identity using two separate identifiers, introduced myself to the patient, provided my credentials, disclosed my location, and performed this encounter via a HIPAA-compliant, real-time, face-to-face, two-way, interactive  audio and video platform and with the full consent and agreement of the patient (or guardian as applicable.)  Patient physical location: [facility] provider physical location: home office in state of [place].  Video start time: 2[address] [facility] Time) Video end time: 2215 (Central Time)  IDENTIFYING DATA  Dylan Moon is a 27 y.o. year-old male for whom a psychiatric consultation has been ordered by the primary provider. The patient was identified using two separate identifiers.  CHIEF COMPLAINT/REASON FOR CONSULT  Erratic behavior  HISTORY OF PRESENT ILLNESS (HPI)  The patient presented to the ED on IVC initiated by his mother due to extreme paranoia, delusional thinking and danger to self and others.  Pt reported became paranoid, believing there were people outside his house that were trying to kill him and others in the house.  He reportedly barricaded himself and another person in the house for their safety, threw a phone at another resident and then called the police himself fearing for his safety.  Pt reportedly has a hx of Schizoaffective Disorder and admits to [facility] use today causing the paranoia.  Pt reportedly has a hx of inpatient and outpatient psychiatric tx with a recent inpatient admission.  Upon evaluation, pt reported he is no longer upset and feels he is safe to go home. Reported he and his girlfriend are planning on going on a trip to [facility] tomorrow as he is a Medical sales representative and [employer].  Admitted to a hx of Schizoaffective Disorder but denied any current psychosis.  Admitted he was seeing someone in the parking lot and called the police for his safety.  Reported "I'm calm now".  Admitted to a recent psychiatric admission in Oct or Sept of this year.  Reported he takes his medications as prescribed but unable to say when his next [place] injection is scheduled.  Based  on previous behaviors and actions, recommend upholding IVC for  further evaluation and tx at this time.    PAST PSYCHIATRIC HISTORY   Otherwise as per HPI above.  PAST MEDICAL HISTORY  Past Medical History:  Diagnosis Date   ADHD    Bipolar 1 disorder (HCC)    Schizoaffective disorder (HCC)    Scoliosis    Seizures (HCC)    most recent 12/02/17     HOME MEDICATIONS  PTA Medications  Medication Sig   loratadine (CLARITIN) 10 MG tablet Take 1 tablet (10 mg total) by mouth daily. (Patient taking differently: Take 10 mg by mouth at bedtime as needed for rhinitis or allergies.)   Eslicarbazepine Acetate (APTIOM) 800 MG TABS Take 1,600 mg by mouth daily.   zonisamide (ZONEGRAN) 100 MG capsule Take 3 capsules (300 mg total) by mouth daily.   lactulose (CHRONULAC) 10 GM/15ML solution Take 45 mLs (30 g total) by mouth 2 (two) times daily. (Patient taking differently: Take 30 g by mouth 2 (two) times daily as needed for mild constipation (or elevated ammonia).)   paliperidone (INVEGA SUSTENNA) 156 MG/ML SUSY injection Inject 1 mL (156 mg total) into the muscle every 30 (thirty) days.   divalproex (DEPAKOTE) 500 MG DR tablet Take 1 tablet (500 mg total) by mouth every 12 (twelve) hours.   paliperidone (INVEGA) 3 MG 24 hr tablet Take 1 tablet (3 mg total) by mouth 2 (two) times daily. (Patient taking differently: Take 3 mg by mouth at bedtime.)   traZODone (DESYREL) 50 MG tablet Take 1 tablet (50 mg total) by mouth at bedtime as needed for sleep.   carvedilol (COREG) 25 MG tablet Take 1 tablet (25 mg total) by mouth 2 (two) times daily with a meal. (Patient taking differently: Take 25 mg by mouth 2 (two) times daily as needed (for hypertension).)   promethazine (PHENERGAN) 6.25 MG/5ML solution Take 5 mLs (6.25 mg total) by mouth every 6 (six) hours as needed for nausea or vomiting.   nicotine (NICODERM CQ - DOSED IN MG/24 HOURS) 14 mg/24hr patch Place 1 patch (14 mg total) onto the skin daily. (Patient not taking: Reported on 08/25/2023)     ALLERGIES  Allergies   Allergen Reactions   Keppra [Levetiracetam] Other (See Comments)    Irritability, mental status changes   Tramadol Other (See Comments)    Mixed with his other meds can cause seizures     Vimpat [Lacosamide] Other (See Comments)    Causes anger, mental status changes    SOCIAL & SUBSTANCE USE HISTORY  Social History   Socioeconomic History   Marital status: Single    Spouse name: Not on file   Number of children: 0   Years of education: HS   Highest education level: Not on file  Occupational History   Occupation: Product manager of videos  Tobacco Use   Smoking status: Some Days    Types: Cigars   Smokeless tobacco: Never  Vaping Use   Vaping status: Never Used  Substance and Sexual Activity   Alcohol use: Not Currently   Drug use: Yes    Types: Marijuana    Comment: Daily use of marijuana.   Sexual activity: Not Currently  Other Topics Concern   Not on file  Social History Narrative   Lives at home his mother.   3-4 sodas per week.   Right-handed.   Social Determinants of Health   Financial Resource Strain: Not on file  Food Insecurity: Food Insecurity Present (07/16/2023)  Hunger Vital Sign    Worried About Running Out of Food in the Last Year: Often true    Ran Out of Food in the Last Year: Sometimes true  Transportation Needs: No Transportation Needs (07/16/2023)   PRAPARE - Administrator, Civil Service (Medical): No    Lack of Transportation (Non-Medical): No  Physical Activity: Not on file  Stress: Not on file  Social Connections: Not on file   Social History   Tobacco Use  Smoking Status Some Days   Types: Cigars  Smokeless Tobacco Never   Social History   Substance and Sexual Activity  Alcohol Use Not Currently   Social History   Substance and Sexual Activity  Drug Use Yes   Types: Marijuana   Comment: Daily use of marijuana.    Additional pertinent information Lives with girlfriend.  FAMILY HISTORY  Family  History  Problem Relation Age of Onset   Diabetes Mother    Hypertension Mother    Cancer Other    Diabetes Father    Seizures Maternal Grandfather    Family Psychiatric History (if known):  Denied  MENTAL STATUS EXAM (MSE)  Presentation  General Appearance:  Appropriate for Environment  Eye Contact: Good  Speech: Pressured  Speech Volume: Normal  Handedness: Right   Mood and Affect  Mood: Euphoric  Affect: Congruent   Thought Process  Thought Processes: Coherent  Descriptions of Associations: Intact  Orientation: Full (Time, Place and Person)  Thought Content: Delusions; Paranoid Ideation  History of Schizophrenia/Schizoaffective disorder: Yes  Duration of Psychotic Symptoms: Greater than six months  Hallucinations:Hallucinations: Other (comment) (Denied)  Ideas of Reference: Delusions; Paranoia  Suicidal Thoughts:Suicidal Thoughts: No  Homicidal Thoughts:Homicidal Thoughts: No   Sensorium  Memory: Immediate Good; Recent Good; Remote Good  Judgment: Poor  Insight: Poor   Executive Functions  Concentration: Fair  Attention Span: Good  Recall: Good  Fund of Knowledge: Good  Language: Good   Psychomotor Activity  Psychomotor Activity:Psychomotor Activity: Normal  Assets  Assets: Communication Skills; Housing; Physical Health; Social Support   Sleep  Sleep:Sleep: Good Number of Hours of Sleep: 8   VITALS  Blood pressure (!) 145/83, pulse 86, temperature 98.5 F (36.9 C), temperature source Oral, resp. rate 16, SpO2 99%.  LABS  Admission on 08/25/2023  Component Date Value Ref Range Status   WBC 08/25/2023 8.1  4.0 - 10.5 K/uL Final   RBC 08/25/2023 5.19  4.22 - 5.81 MIL/uL Final   Hemoglobin 08/25/2023 14.7  13.0 - 17.0 g/dL Final   HCT 19/14/7829 44.1  39.0 - 52.0 % Final   MCV 08/25/2023 85.0  80.0 - 100.0 fL Final   MCH 08/25/2023 28.3  26.0 - 34.0 pg Final   MCHC 08/25/2023 33.3  30.0 - 36.0 g/dL  Final   RDW 56/21/3086 13.5  11.5 - 15.5 % Final   Platelets 08/25/2023 239  150 - 400 K/uL Final   nRBC 08/25/2023 0.0  0.0 - 0.2 % Final   Neutrophils Relative % 08/25/2023 68  % Final   Neutro Abs 08/25/2023 5.6  1.7 - 7.7 K/uL Final   Lymphocytes Relative 08/25/2023 21  % Final   Lymphs Abs 08/25/2023 1.7  0.7 - 4.0 K/uL Final   Monocytes Relative 08/25/2023 9  % Final   Monocytes Absolute 08/25/2023 0.7  0.1 - 1.0 K/uL Final   Eosinophils Relative 08/25/2023 0  % Final   Eosinophils Absolute 08/25/2023 0.0  0.0 - 0.5 K/uL Final  Basophils Relative 08/25/2023 1  % Final   Basophils Absolute 08/25/2023 0.0  0.0 - 0.1 K/uL Final   Immature Granulocytes 08/25/2023 1  % Final   Abs Immature Granulocytes 08/25/2023 0.04  0.00 - 0.07 K/uL Final   Performed at [facility], [address], [place], [place] [address]   Sodium 08/25/2023 135  135 - 145 mmol/L Final   Potassium 08/25/2023 3.9  3.5 - 5.1 mmol/L Final   Chloride 08/25/2023 105  98 - 111 mmol/L Final   CO2 08/25/2023 23  22 - 32 mmol/L Final   Glucose, Bld 08/25/2023 99  70 - 99 mg/dL Final   Glucose reference range applies only to samples taken after fasting for at least 8 hours.   BUN 08/25/2023 16  6 - 20 mg/dL Final   Creatinine, Ser 08/25/2023 0.95  0.61 - 1.24 mg/dL Final   Calcium 60/45/4098 9.4  8.9 - 10.3 mg/dL Final   Total Protein 11/91/4782 8.0  6.5 - 8.1 g/dL Final   Albumin 95/62/1308 4.9  3.5 - 5.0 g/dL Final   AST 65/78/4696 23  15 - 41 U/L Final   ALT 08/25/2023 24  0 - 44 U/L Final   Alkaline Phosphatase 08/25/2023 46  38 - 126 U/L Final   Total Bilirubin 08/25/2023 0.5  <1.2 mg/dL Final   GFR, Estimated 08/25/2023 >60  >60 mL/min Final   Comment: (NOTE) Calculated using the CKD-EPI Creatinine Equation (2021)    Anion gap 08/25/2023 7  5 - 15 Final   Performed at [facility], [address], [place], [place] [address]   Opiates 08/25/2023 NONE DETECTED  NONE  DETECTED Final   Cocaine 08/25/2023 NONE DETECTED  NONE DETECTED Final   Benzodiazepines 08/25/2023 NONE DETECTED  NONE DETECTED Final   Amphetamines 08/25/2023 NONE DETECTED  NONE DETECTED Final   Tetrahydrocannabinol 08/25/2023 POSITIVE (A)  NONE DETECTED Final   Barbiturates 08/25/2023 NONE DETECTED  NONE DETECTED Final   Comment: (NOTE) DRUG SCREEN FOR MEDICAL PURPOSES ONLY.  IF CONFIRMATION IS NEEDED FOR ANY PURPOSE, NOTIFY LAB WITHIN 5 DAYS.  LOWEST DETECTABLE LIMITS FOR URINE DRUG SCREEN Drug Class                     Cutoff (ng/mL) Amphetamine and metabolites    1000 Barbiturate and metabolites    200 Benzodiazepine                 200 Opiates and metabolites        300 Cocaine and metabolites        300 THC                            50 Performed at [facility], [address], [place], [place] [address]     PSYCHIATRIC REVIEW OF SYSTEMS (ROS)  ROS: Notable for the following relevant positive findings: Review of Systems  Constitutional: Negative.   HENT: Negative.    Eyes: Negative.   Respiratory: Negative.    Cardiovascular: Negative.   Gastrointestinal: Negative.   Genitourinary: Negative.   Musculoskeletal: Negative.   Skin: Negative.   Neurological: Negative.   Endo/Heme/Allergies: Negative.   Psychiatric/Behavioral:  Positive for hallucinations.        Denied A/V hallucinations or paranoia/delusional thinking but petition indicates paranoia and delusional thinking     Additional findings:      Musculoskeletal: No abnormal movements observed  Gait & Station: Laying/Sitting      Pain Screening: Denies      Nutrition & Dental Concerns: If yes - consider referral to nutritional or dental specialist  RISK FORMULATION/ASSESSMENT  Is the patient experiencing any suicidal or homicidal ideations: No       Explain if yes:  Protective factors considered for safety management: Supportive family  Risk factors/concerns considered for safety  management:  Substance abuse/dependence Impulsivity Aggression Male gender Unmarried  Is there a safety management plan with the patient and treatment team to minimize risk factors and promote protective factors: Yes           Explain: Medication, inpatient psychiatric admission Is crisis care placement or psychiatric hospitalization recommended: Yes     Based on my current evaluation and risk assessment, patient is determined at this time to be at:  High risk  *RISK ASSESSMENT Risk assessment is a dynamic process; it is possible that this patient's condition, and risk level, may change. This should be re-evaluated and managed over time as appropriate. Please re-consult psychiatric consult services if additional assistance is needed in terms of risk assessment and management. If your team decides to discharge this patient, please advise the patient how to best access emergency psychiatric services, or to call 911, if their condition worsens or they feel unsafe in any way.   Harlene Salts, NP Telepsychiatry Consult Services

## 2023-08-26 NOTE — Consult Note (Signed)
  Patient is accepted at [facility] Mental health unit for treatment.  Patient admitted that after smoking Marijuana he became paranoid and could not come out of the house.  His mother informed this provider that patient was hiding in the house.  He saw a delivery truck and panicked thinking people were out to get him.  Mother states patient is not taking his oral Medication but is using Cannabis to self Medication.  Patient is under IVC and will be transported once [facility] is here.

## 2023-08-26 NOTE — Progress Notes (Addendum)
Pt has been accepted to [facility] on 08/26/2023 Bed assignment: Margo Aye 100   Pt meets inpatient criteria per: Dahlia Byes NP   Attending Physician will be: Sherrian Divers MD  Report can be called to: [phone]  Pt can arrive after ANY TIME TODAY   Care Team Notified: [facility] Rona Ravens RN, Dahlia Byes NP,  Lum Babe RN  [place] Everlean Bucher LCSW-A   08/26/2023 10:45 AM

## 2023-08-26 NOTE — ED Notes (Signed)
Patient given meal tray.

## 2023-08-26 NOTE — ED Notes (Signed)
Patient off unit to [facility] per provider. Patient alert and no s/s of distress Patient discharge information and belongings given to [facility] staff for transport. Patient ambulatory off unit, escorted and transport by sheriff.

## 2023-08-26 NOTE — Progress Notes (Signed)
LCSW Progress Note  [id number]   Dylan Moon  08/26/2023  10:11 AM  Description:   Inpatient Psychiatric Referral  Patient was recommended inpatient per Dahlia Byes  NP. There are no available beds at [facility], per [facility] Rona Ravens RN. Patient was referred to the following out of network facilities:   Destination  Service Provider Address Phone Fax  [facility]  [address] [place] [address] [phone]  [facility] [address]  [place] [address] [phone]  [facility]  [address], [place] [address] [phone]  [facility]  [address] [place] [address] [phone]  [facility]  [address], [place] [address] [phone]  [facility]  [address] [place], [place] [address] [phone]  [facility]  [address] [place] [address] [phone]  [facility]  [address] [place] [address] [phone]  [facility]  [address] [place] [address] [phone]  [facility]  [address], [place] [address] [phone]  [facility]  [address], [place] [address] [phone]  [facility]  [place] [phone]  [facility]  [address] [place] [address] [phone]  [facility]  [address] [place], [place] [phone]  [facility]  [address], [place] [address] [phone]  [facility]  [address] [place] [address] [phone]  [facility]  [address], [place] [address] [phone]  [facility] EFAX  [place] [phone]      Situation ongoing, CSW to continue following and update chart as more information becomes available.      [place] Rasool Rommel, MSW, LCSW  08/26/2023 10:11 AM

## 2023-08-26 NOTE — ED Provider Notes (Signed)
Emergency Medicine Observation Re-evaluation Note  Dylan Moon is a 27 y.o. male, seen on rounds today.  Pt initially presented to the ED for complaints of IVC, Paranoid, and Erratic Behavior Currently, the patient is awaiting inpatient care for paranoia, psychosis.  Physical Exam  BP (!) 143/79 (BP Location: Left Arm)   Pulse 71   Temp (!) 97.5 F (36.4 C) (Oral)   Resp 18   SpO2 98%  Physical Exam General: NAD , walking with steady gait Cardiac: RR Lungs: even unlabored Psych: NA  ED Course / MDM  EKG:   I have reviewed the labs performed to date as well as medications administered while in observation.  Recent changes in the last 24 hours include none.  Plan  Current plan is for transfer to [facility]. Transferred in stable condition.    Alvira Monday, MD 08/26/23 1158

## 2023-08-26 NOTE — ED Notes (Addendum)
Patient is talking about escaping through the door that staff and security go through to leave so that he does not have to have to be transported to [employer]

## 2023-08-26 NOTE — ED Notes (Signed)
Gave patient two sprites.

## 2023-09-02 ENCOUNTER — Telehealth (HOSPITAL_COMMUNITY): Payer: Self-pay | Admitting: Physician Assistant

## 2023-09-03 ENCOUNTER — Emergency Department (HOSPITAL_COMMUNITY)
Admission: EM | Admit: 2023-09-03 | Discharge: 2023-09-04 | Disposition: A | Discharge status: Other Acute Inpatient Hospital | Payer: MEDICAID | Attending: Emergency Medicine | Admitting: Emergency Medicine

## 2023-09-03 DIAGNOSIS — F29 Unspecified psychosis not due to a substance or known physiological condition: Secondary | ICD-10-CM | POA: Diagnosis not present

## 2023-09-03 DIAGNOSIS — R443 Hallucinations, unspecified: Secondary | ICD-10-CM | POA: Diagnosis present

## 2023-09-03 DIAGNOSIS — R45851 Suicidal ideations: Secondary | ICD-10-CM | POA: Diagnosis not present

## 2023-09-03 DIAGNOSIS — F259 Schizoaffective disorder, unspecified: Secondary | ICD-10-CM | POA: Insufficient documentation

## 2023-09-03 LAB — CBC WITH DIFFERENTIAL/PLATELET
Abs Immature Granulocytes: 0.01 10*3/uL (ref 0.00–0.07)
Basophils Absolute: 0 10*3/uL (ref 0.0–0.1)
Basophils Relative: 0 %
Eosinophils Absolute: 0 10*3/uL (ref 0.0–0.5)
Eosinophils Relative: 0 %
HCT: 43.5 % (ref 39.0–52.0)
Hemoglobin: 14.2 g/dL (ref 13.0–17.0)
Immature Granulocytes: 0 %
Lymphocytes Relative: 18 %
Lymphs Abs: 1.3 10*3/uL (ref 0.7–4.0)
MCH: 28.1 pg (ref 26.0–34.0)
MCHC: 32.6 g/dL (ref 30.0–36.0)
MCV: 86.1 fL (ref 80.0–100.0)
Monocytes Absolute: 0.5 10*3/uL (ref 0.1–1.0)
Monocytes Relative: 7 %
Neutro Abs: 5.4 10*3/uL (ref 1.7–7.7)
Neutrophils Relative %: 75 %
Platelets: 248 10*3/uL (ref 150–400)
RBC: 5.05 MIL/uL (ref 4.22–5.81)
RDW: 13.2 % (ref 11.5–15.5)
WBC: 7.3 10*3/uL (ref 4.0–10.5)
nRBC: 0 % (ref 0.0–0.2)

## 2023-09-03 LAB — COMPREHENSIVE METABOLIC PANEL
ALT: 13 U/L (ref 0–44)
AST: 27 U/L (ref 15–41)
Albumin: 4.7 g/dL (ref 3.5–5.0)
Alkaline Phosphatase: 42 U/L (ref 38–126)
Anion gap: 8 (ref 5–15)
BUN: 14 mg/dL (ref 6–20)
CO2: 23 mmol/L (ref 22–32)
Calcium: 9.5 mg/dL (ref 8.9–10.3)
Chloride: 101 mmol/L (ref 98–111)
Creatinine, Ser: 1.39 mg/dL — ABNORMAL HIGH (ref 0.61–1.24)
GFR, Estimated: >60 mL/min (ref >60)
Glucose, Bld: 111 mg/dL — ABNORMAL HIGH (ref 70–99)
Potassium: 4.7 mmol/L (ref 3.5–5.1)
Sodium: 132 mmol/L — ABNORMAL LOW (ref 135–145)
Total Bilirubin: 0.7 mg/dL (ref <1.2)
Total Protein: 8 g/dL (ref 6.5–8.1)

## 2023-09-03 LAB — SALICYLATE LEVEL: Salicylate Lvl: <7 mg/dL — ABNORMAL LOW (ref 7.0–30.0)

## 2023-09-03 LAB — ETHANOL: Alcohol, Ethyl (B): <10 mg/dL (ref <10)

## 2023-09-03 LAB — VALPROIC ACID LEVEL: Valproic Acid Lvl: 75 ug/mL (ref 50.0–100.0)

## 2023-09-03 LAB — ACETAMINOPHEN LEVEL: Acetaminophen (Tylenol), Serum: <10 ug/mL — ABNORMAL LOW (ref 10–30)

## 2023-09-03 MED ORDER — CLONAZEPAM 0.125 MG PO TBDP: 0.2500 mg | ORAL_TABLET | Freq: Every day | ORAL | Status: DC | PRN

## 2023-09-03 MED ORDER — ESCITALOPRAM OXALATE 10 MG PO TABS
10.0000 mg | ORAL_TABLET | Freq: Every day | ORAL | Status: DC
  Administered 2023-09-04: 10 mg via ORAL
  Filled 2023-09-03: qty 1

## 2023-09-03 MED ORDER — DIPHENHYDRAMINE HCL 50 MG/ML IJ SOLN
50.0000 mg | Freq: Once | INTRAMUSCULAR | Status: AC | PRN
  Administered 2023-09-03: 50 mg via INTRAMUSCULAR
  Filled 2023-09-03: qty 1

## 2023-09-03 MED ORDER — TRAZODONE HCL 50 MG PO TABS: 50.0000 mg | ORAL_TABLET | Freq: Every evening | ORAL | Status: DC | PRN

## 2023-09-03 MED ORDER — DIVALPROEX SODIUM 500 MG PO DR TAB
500.0000 mg | DELAYED_RELEASE_TABLET | Freq: Two times a day (BID) | ORAL | Status: DC
  Administered 2023-09-04: 500 mg via ORAL
  Filled 2023-09-03 (×2): qty 1

## 2023-09-03 MED ORDER — LORAZEPAM 2 MG/ML IJ SOLN
2.0000 mg | Freq: Once | INTRAMUSCULAR | Status: AC | PRN
  Administered 2023-09-03: 2 mg via INTRAMUSCULAR
  Filled 2023-09-03: qty 1

## 2023-09-03 MED ORDER — ZONISAMIDE 100 MG PO CAPS
300.0000 mg | ORAL_CAPSULE | Freq: Every day | ORAL | Status: DC
  Administered 2023-09-04: 300 mg via ORAL
  Filled 2023-09-03 (×2): qty 3

## 2023-09-03 MED ORDER — HALOPERIDOL LACTATE 5 MG/ML IJ SOLN
5.0000 mg | Freq: Once | INTRAMUSCULAR | Status: AC | PRN
  Administered 2023-09-03: 5 mg via INTRAMUSCULAR
  Filled 2023-09-03: qty 1

## 2023-09-03 NOTE — ED Notes (Addendum)
While giving report to receiving RN and retrieving belongings to take pt to TCU pt ran out of unit and fled hospital property, Security and [employer] advised and began searching for pt.   Security not at bed side when pt left facility.

## 2023-09-03 NOTE — ED Notes (Signed)
Pt belongings placed in TCU locker 29

## 2023-09-03 NOTE — BH Assessment (Signed)
Pt given 50mg  Benadryl Im at 21:56; 5mg  Haldol Im at 21:55; 2mg  Ativan IM at 21:56. Pt to be seen by daytime provider on 12/12.

## 2023-09-03 NOTE — ED Triage Notes (Addendum)
PT BIB [employer] for psychosis, hallucinations and paranoia x 2days after behavioral health discharge. IVCed by [employer]   Pt is currently under the influence of marijuana and cooperative.

## 2023-09-03 NOTE — ED Notes (Addendum)
Security advised that mom states pt is making statements of leaving AMA while on IVC, Security on standby.   Pt observation Psychiatrist) order placed by Press photographer. - not filled

## 2023-09-03 NOTE — ED Notes (Addendum)
Two bags of patient belongings collected and put in room 15 cabinet. Patient changed into burgundy scrubs and wanded by security

## 2023-09-03 NOTE — ED Notes (Signed)
Pt unable to participate in TTS screening d/t medication given for agitations aggressive bx and attempted elopement.

## 2023-09-03 NOTE — ED Provider Notes (Signed)
[place] EMERGENCY DEPARTMENT AT [facility] Provider Note   CSN: [id number] Arrival date & time: 09/03/23  1846     History  No chief complaint on file.   Dylan Moon is a 27 y.o. male history of bipolar here presenting with hallucinations and aggressive behavior.  Patient was just discharged from psych facility yesterday.  Patient was IVC by mother.  Per the IVC paperwork, patient was threatening to her and aggressive.  Patient also wants to gun to kill himself.  Patient also has hallucinations and felt unsafe.  Upon arrival, patient denies everything and states that he wants to go home.  However during the last ED encounter, patient stated the same thing and psychiatry decided to maintain IVC and admit patient to psych facility.  Of note, per patient, he was told that he needs to take his Depakote by mother.  He states that he actually took his Depakote already and they had an argument and that is why he was sent here.  The history is provided by the patient.       Home Medications Prior to Admission medications   Medication Sig Start Date End Date Taking? Authorizing Provider  carvedilol (COREG) 25 MG tablet Take 1 tablet (25 mg total) by mouth 2 (two) times daily with a meal. Patient taking differently: Take 25 mg by mouth 2 (two) times daily as needed (for hypertension). 08/06/23   Nwoko, Tommas Olp, PA  clonazePAM (KLONOPIN) 0.25 MG disintegrating tablet Take 0.25 mg by mouth daily as needed (as directed- dissolve orally).    [provider]  divalproex (DEPAKOTE) 500 MG DR tablet Take 1 tablet (500 mg total) by mouth every 12 (twelve) hours. 08/06/23   Nwoko, Tommas Olp, PA  ergocalciferol (VITAMIN D2) 1.25 MG (50000 UT) capsule Take 50,000 Units by mouth every Wednesday.    [provider]  escitalopram (LEXAPRO) 10 MG tablet Take 10 mg by mouth daily.    [provider]  Eslicarbazepine Acetate (APTIOM) 800 MG TABS Take 1,600 mg by  mouth daily. 02/05/23   [name], NP  famotidine (PEPCID) 40 MG tablet Take 40 mg by mouth daily as needed for heartburn or indigestion.    [provider]  lactulose (CHRONULAC) 10 GM/15ML solution Take 45 mLs (30 g total) by mouth 2 (two) times daily. Patient taking differently: Take 30 g by mouth 2 (two) times daily as needed for mild constipation (or elevated ammonia). 07/23/23   Sarita Bottom, MD  levOCARNitine (CARNITOR) 1 GM/10ML solution Take 1,000 mg by mouth 2 (two) times daily.    [provider]  loratadine (CLARITIN) 10 MG tablet Take 1 tablet (10 mg total) by mouth daily. Patient taking differently: Take 10 mg by mouth at bedtime as needed for rhinitis or allergies. 02/05/23   Nkwenti, Tyler Aas, NP  nicotine (NICODERM CQ - DOSED IN MG/24 HOURS) 14 mg/24hr patch Place 1 patch (14 mg total) onto the skin daily. Patient not taking: Reported on 08/25/2023 07/23/23   Sarita Bottom, MD  Nicotine (NICODERM CQ TD) Place 1 patch onto the skin daily as needed (for smoking cessation while hospitalized).    [provider]  paliperidone (INVEGA SUSTENNA) 156 MG/ML SUSY injection Inject 1 mL (156 mg total) into the muscle every 30 (thirty) days. 07/23/23   Sarita Bottom, MD  paliperidone (INVEGA) 3 MG 24 hr tablet Take 1 tablet (3 mg total) by mouth 2 (two) times daily. Patient taking differently: Take 3 mg by mouth  at bedtime. 08/06/23   Nwoko, Tommas Olp, PA  promethazine (PHENERGAN) 6.25 MG/5ML solution Take 5 mLs (6.25 mg total) by mouth every 6 (six) hours as needed for nausea or vomiting. 08/06/23   Nwoko, Tommas Olp, PA  traZODone (DESYREL) 50 MG tablet Take 1 tablet (50 mg total) by mouth at bedtime as needed for sleep. 08/06/23   Nwoko, Tommas Olp, PA  zonisamide (ZONEGRAN) 100 MG capsule Take 3 capsules (300 mg total) by mouth daily. 02/04/23   [name], NP      Allergies    Keppra [levetiracetam], Tramadol, and Vimpat [lacosamide]    Review of Systems    Review of Systems  Psychiatric/Behavioral:  Positive for hallucinations and suicidal ideas.   All other systems reviewed and are negative.   Physical Exam Updated Vital Signs BP (!) 156/92 (BP Location: Right Arm)   Pulse 94   Temp 98.9 F (37.2 C) (Oral)   Resp (!) 23   SpO2 99%  Physical Exam Vitals and nursing note reviewed.  Constitutional:      Comments: Patient is slightly agitated and wanting to leave.  Patient also is responding to internal stimuli  HENT:     Head: Normocephalic.     Nose: Nose normal.     Mouth/Throat:     Mouth: Mucous membranes are moist.  Eyes:     Extraocular Movements: Extraocular movements intact.     Pupils: Pupils are equal, round, and reactive to light.  Cardiovascular:     Rate and Rhythm: Normal rate and regular rhythm.  Pulmonary:     Effort: Pulmonary effort is normal.     Breath sounds: Normal breath sounds.  Abdominal:     General: Abdomen is flat.  Musculoskeletal:        General: Normal range of motion.     Cervical back: Normal range of motion and neck supple.  Skin:    General: Skin is warm.     Capillary Refill: Capillary refill takes less than 2 seconds.  Neurological:     General: No focal deficit present.  Psychiatric:     Comments: Responding to internal stimuli.     ED Results / Procedures / Treatments   Labs (all labs ordered are listed, but only abnormal results are displayed) Labs Reviewed  COMPREHENSIVE METABOLIC PANEL - Abnormal; Notable for the following components:      Result Value   Sodium 132 (*)    Glucose, Bld 111 (*)    Creatinine, Ser 1.39 (*)    All other components within normal limits  SALICYLATE LEVEL - Abnormal; Notable for the following components:   Salicylate Lvl <7.0 (*)    All other components within normal limits  ACETAMINOPHEN LEVEL - Abnormal; Notable for the following components:   Acetaminophen (Tylenol), Serum <10 (*)    All other components within normal limits  CBC WITH  DIFFERENTIAL/PLATELET  ETHANOL  VALPROIC ACID LEVEL  URINALYSIS, ROUTINE W REFLEX MICROSCOPIC  RAPID URINE DRUG SCREEN, HOSP PERFORMED    EKG EKG Interpretation Date/Time:  Wednesday September 03 2023 18:54:59 EST Ventricular Rate:  94 PR Interval:  170 QRS Duration:  82 QT Interval:  325 QTC Calculation: 407 R Axis:   50  Text Interpretation: Sinus rhythm Probable left atrial enlargement Borderline repolarization abnormality No significant change since last tracing Confirmed by Richardean Canal [phone]) on 09/03/2023 7:24:46 PM  Radiology No results found.  Procedures Procedures    Medications Ordered in ED Medications  LORazepam (ATIVAN)  injection 2 mg (has no administration in time range)  haloperidol lactate (HALDOL) injection 5 mg (has no administration in time range)  diphenhydrAMINE (BENADRYL) injection 50 mg (has no administration in time range)  clonazepam (KLONOPIN) disintegrating tablet 0.25 mg (has no administration in time range)  divalproex (DEPAKOTE) DR tablet 500 mg (has no administration in time range)  escitalopram (LEXAPRO) tablet 10 mg (has no administration in time range)  traZODone (DESYREL) tablet 50 mg (has no administration in time range)  zonisamide (ZONEGRAN) capsule 300 mg (has no administration in time range)    ED Course/ Medical Decision Making/ A&P                                 Medical Decision Making Jorryn E Detoro is a 27 y.o. male here presenting with hallucinations and suicidal ideation.  Patient is under IVC.  I upheld the first exam.  Will get psych clearance labs and consult TTS.  8:36 PM Labs unremarkable and Depakote level was therapeutic.  Patient is medically cleared for psych eval  9:50 PM Patient eloped from the ED and was caught by police.  Patient was given Ativan and Haldol and Benadryl.  Psych consult is pending.  Problems Addressed: Hallucinations: chronic illness or injury Suicidal ideation: acute illness or  injury  Amount and/or Complexity of Data Reviewed Labs: ordered. Decision-making details documented in ED Course.  Risk Prescription drug management.   Final Clinical Impression(s) / ED Diagnoses Final diagnoses:  None    Rx / DC Orders ED Discharge Orders     None         Charlynne Pander, MD 09/03/23 2150

## 2023-09-03 NOTE — ED Notes (Signed)
UA sample in lab if needed.

## 2023-09-04 ENCOUNTER — Ambulatory Visit (HOSPITAL_COMMUNITY): Payer: MEDICAID

## 2023-09-04 ENCOUNTER — Encounter (HOSPITAL_COMMUNITY): Payer: Self-pay | Admitting: Psychiatry

## 2023-09-04 ENCOUNTER — Other Ambulatory Visit: Payer: Self-pay

## 2023-09-04 ENCOUNTER — Inpatient Hospital Stay (HOSPITAL_COMMUNITY)
Admission: AD | Admit: 2023-09-04 | Discharge: 2023-09-09 | DRG: 885 | Disposition: A | Discharge status: Home / Self Care | Payer: MEDICAID | Source: Intra-hospital | Attending: Psychiatry | Admitting: Psychiatry

## 2023-09-04 DIAGNOSIS — R443 Hallucinations, unspecified: Secondary | ICD-10-CM

## 2023-09-04 DIAGNOSIS — Z885 Allergy status to narcotic agent status: Secondary | ICD-10-CM

## 2023-09-04 DIAGNOSIS — M419 Scoliosis, unspecified: Secondary | ICD-10-CM | POA: Diagnosis present

## 2023-09-04 DIAGNOSIS — E871 Hypo-osmolality and hyponatremia: Secondary | ICD-10-CM | POA: Diagnosis present

## 2023-09-04 DIAGNOSIS — N179 Acute kidney failure, unspecified: Secondary | ICD-10-CM | POA: Diagnosis present

## 2023-09-04 DIAGNOSIS — Z5941 Food insecurity: Secondary | ICD-10-CM

## 2023-09-04 DIAGNOSIS — Z79899 Other long term (current) drug therapy: Secondary | ICD-10-CM | POA: Diagnosis not present

## 2023-09-04 DIAGNOSIS — F411 Generalized anxiety disorder: Secondary | ICD-10-CM | POA: Diagnosis present

## 2023-09-04 DIAGNOSIS — G40909 Epilepsy, unspecified, not intractable, without status epilepticus: Secondary | ICD-10-CM | POA: Diagnosis present

## 2023-09-04 DIAGNOSIS — F4310 Post-traumatic stress disorder, unspecified: Secondary | ICD-10-CM | POA: Diagnosis present

## 2023-09-04 DIAGNOSIS — F312 Bipolar disorder, current episode manic severe with psychotic features: Secondary | ICD-10-CM | POA: Diagnosis present

## 2023-09-04 DIAGNOSIS — Z8249 Family history of ischemic heart disease and other diseases of the circulatory system: Secondary | ICD-10-CM | POA: Diagnosis not present

## 2023-09-04 DIAGNOSIS — F17290 Nicotine dependence, other tobacco product, uncomplicated: Secondary | ICD-10-CM | POA: Diagnosis present

## 2023-09-04 DIAGNOSIS — F909 Attention-deficit hyperactivity disorder, unspecified type: Secondary | ICD-10-CM | POA: Diagnosis present

## 2023-09-04 DIAGNOSIS — R45851 Suicidal ideations: Secondary | ICD-10-CM | POA: Diagnosis present

## 2023-09-04 DIAGNOSIS — I10 Essential (primary) hypertension: Secondary | ICD-10-CM | POA: Diagnosis present

## 2023-09-04 DIAGNOSIS — E559 Vitamin D deficiency, unspecified: Secondary | ICD-10-CM | POA: Diagnosis present

## 2023-09-04 DIAGNOSIS — F259 Schizoaffective disorder, unspecified: Secondary | ICD-10-CM | POA: Diagnosis not present

## 2023-09-04 DIAGNOSIS — G40109 Localization-related (focal) (partial) symptomatic epilepsy and epileptic syndromes with simple partial seizures, not intractable, without status epilepticus: Secondary | ICD-10-CM | POA: Diagnosis present

## 2023-09-04 DIAGNOSIS — F410 Panic disorder [episodic paroxysmal anxiety] without agoraphobia: Secondary | ICD-10-CM | POA: Diagnosis present

## 2023-09-04 DIAGNOSIS — F319 Bipolar disorder, unspecified: Principal | ICD-10-CM | POA: Diagnosis present

## 2023-09-04 DIAGNOSIS — Z888 Allergy status to other drugs, medicaments and biological substances status: Secondary | ICD-10-CM

## 2023-09-04 DIAGNOSIS — E860 Dehydration: Secondary | ICD-10-CM | POA: Diagnosis present

## 2023-09-04 DIAGNOSIS — Z833 Family history of diabetes mellitus: Secondary | ICD-10-CM

## 2023-09-04 DIAGNOSIS — F1290 Cannabis use, unspecified, uncomplicated: Secondary | ICD-10-CM | POA: Diagnosis present

## 2023-09-04 DIAGNOSIS — F5105 Insomnia due to other mental disorder: Secondary | ICD-10-CM

## 2023-09-04 DIAGNOSIS — G4700 Insomnia, unspecified: Secondary | ICD-10-CM | POA: Diagnosis present

## 2023-09-04 MED ORDER — MAGNESIUM HYDROXIDE 400 MG/5ML PO SUSP: 30.0000 mL | Freq: Every day | ORAL | Status: DC | PRN

## 2023-09-04 MED ORDER — DIVALPROEX SODIUM 500 MG PO DR TAB
500.0000 mg | DELAYED_RELEASE_TABLET | Freq: Two times a day (BID) | ORAL | Status: DC
  Administered 2023-09-04 – 2023-09-05 (×2): 500 mg via ORAL
  Filled 2023-09-04 (×6): qty 1

## 2023-09-04 MED ORDER — LORAZEPAM 2 MG/ML IJ SOLN: 2.0000 mg | Freq: Three times a day (TID) | INTRAMUSCULAR | Status: DC | PRN

## 2023-09-04 MED ORDER — ALUM & MAG HYDROXIDE-SIMETH 200-200-20 MG/5ML PO SUSP: 30.0000 mL | ORAL | Status: DC | PRN

## 2023-09-04 MED ORDER — HALOPERIDOL LACTATE 5 MG/ML IJ SOLN: 10.0000 mg | Freq: Three times a day (TID) | INTRAMUSCULAR | Status: DC | PRN

## 2023-09-04 MED ORDER — ESCITALOPRAM OXALATE 10 MG PO TABS
10.0000 mg | ORAL_TABLET | Freq: Every day | ORAL | Status: DC
  Administered 2023-09-05 – 2023-09-09 (×5): 10 mg via ORAL
  Filled 2023-09-04 (×7): qty 1

## 2023-09-04 MED ORDER — CARVEDILOL 12.5 MG PO TABS
25.0000 mg | ORAL_TABLET | Freq: Once | ORAL | Status: AC
Start: 2023-09-04 — End: 2023-09-04
  Administered 2023-09-04: 25 mg via ORAL
  Filled 2023-09-04: qty 2

## 2023-09-04 MED ORDER — ACETAMINOPHEN 325 MG PO TABS: 650.0000 mg | ORAL_TABLET | Freq: Four times a day (QID) | ORAL | Status: DC | PRN

## 2023-09-04 MED ORDER — ZONISAMIDE 100 MG PO CAPS
300.0000 mg | ORAL_CAPSULE | Freq: Every day | ORAL | Status: DC
  Administered 2023-09-05 – 2023-09-09 (×5): 300 mg via ORAL
  Filled 2023-09-04 (×7): qty 3

## 2023-09-04 MED ORDER — HALOPERIDOL LACTATE 5 MG/ML IJ SOLN: 5.0000 mg | Freq: Three times a day (TID) | INTRAMUSCULAR | Status: DC | PRN

## 2023-09-04 MED ORDER — CARVEDILOL 12.5 MG PO TABS
25.0000 mg | ORAL_TABLET | Freq: Two times a day (BID) | ORAL | Status: DC
  Administered 2023-09-05 – 2023-09-09 (×9): 25 mg via ORAL
  Filled 2023-09-04 (×2): qty 2
  Filled 2023-09-04 (×3): qty 1
  Filled 2023-09-04: qty 2
  Filled 2023-09-04: qty 1
  Filled 2023-09-04: qty 2
  Filled 2023-09-04 (×2): qty 1
  Filled 2023-09-04: qty 2
  Filled 2023-09-04: qty 1
  Filled 2023-09-04 (×3): qty 2

## 2023-09-04 MED ORDER — PALIPERIDONE PALMITATE ER 156 MG/ML IM SUSY
156.0000 mg | PREFILLED_SYRINGE | Freq: Once | INTRAMUSCULAR | Status: AC
  Administered 2023-09-04: 156 mg via INTRAMUSCULAR
  Filled 2023-09-04: qty 1

## 2023-09-04 MED ORDER — HYDROXYZINE HCL 25 MG PO TABS: 25.0000 mg | ORAL_TABLET | Freq: Three times a day (TID) | ORAL | Status: DC | PRN

## 2023-09-04 MED ORDER — DIPHENHYDRAMINE HCL 50 MG/ML IJ SOLN: 50.0000 mg | Freq: Three times a day (TID) | INTRAMUSCULAR | Status: DC | PRN

## 2023-09-04 MED ORDER — ESLICARBAZEPINE ACETATE 800 MG PO TABS
1600.0000 mg | ORAL_TABLET | Freq: Every day | ORAL | Status: DC
Start: 2023-09-05 — End: 2023-09-09
  Administered 2023-09-05 – 2023-09-09 (×5): 1600 mg via ORAL

## 2023-09-04 MED ORDER — DIPHENHYDRAMINE HCL 25 MG PO CAPS: 50.0000 mg | ORAL_CAPSULE | Freq: Three times a day (TID) | ORAL | Status: DC | PRN

## 2023-09-04 MED ORDER — CLONAZEPAM 0.25 MG PO TBDP: 0.2500 mg | ORAL_TABLET | Freq: Every day | ORAL | Status: DC | PRN

## 2023-09-04 MED ORDER — HALOPERIDOL 5 MG PO TABS: 5.0000 mg | ORAL_TABLET | Freq: Three times a day (TID) | ORAL | Status: DC | PRN

## 2023-09-04 MED ORDER — ESLICARBAZEPINE ACETATE 800 MG PO TABS
1600.0000 mg | ORAL_TABLET | Freq: Every day | ORAL | Status: DC
  Administered 2023-09-04: 1600 mg via ORAL
  Filled 2023-09-04: qty 2

## 2023-09-04 MED ORDER — TRAZODONE HCL 50 MG PO TABS
50.0000 mg | ORAL_TABLET | Freq: Every evening | ORAL | Status: DC | PRN
  Administered 2023-09-05 – 2023-09-08 (×4): 50 mg via ORAL
  Filled 2023-09-04 (×4): qty 1

## 2023-09-04 NOTE — ED Notes (Signed)
Pt given lunch tray.

## 2023-09-04 NOTE — Consult Note (Signed)
[facility] Psychiatric Consult Initial  Patient Name: .KARSIN FRIEDLAND  MRN: [id number]  DOB: [birth year]  Consult Order details:  Orders (From admission, onward)     Start     Ordered   09/03/23 2037  CONSULT TO CALL ACT TEAM       Ordering Provider: Charlynne Pander, MD  Provider:  (Not yet assigned)  Question Answer Comment  Place call to: ACT   Reason for Consult Admit      09/03/23 2036             Mode of Visit: Location of Provider [facility] Face to Face    Psychiatry Consult Evaluation  Service Date: September 04, 2023 LOS:  LOS: 0 days  Chief Complaint Psychosis and Paranoia  Primary Psychiatric Diagnoses  Schizoaffective Disorder 2.   Psychosis 3.   Aggressive behavior  Assessment  Keysean E Waye is a 27 y.o. male admitted: Presented to the EDfor 09/03/2023  6:47 PM for psychosis, hallucinations and paranoia x 2days. He carries the psychiatric diagnoses of Schizoaffective disorder and has a past medical history of  Intractable epilepsy with status epilepticus, unspecified epilepsy type. Patient has a vagal nerve stimulator placed on left side of chest.  His current presentation of paranoia is most consistent with patients diagnosis of schizoaffective disorder. He meets criteria for psychiatric inpatient admission.  Current outpatient psychotropic medications include Invega Sustenna 156 mg IM q 28 days and Invega 3 mg PO BID and historically he has had a positive response to these medications. He was compliant with medications prior to admission as evidenced by patient stating he has been compliant. On initial examination, patient is resting in his bed. Please see plan below for detailed recommendations.   Diagnoses:  Active Hospital problems: Active Problems:   * No active hospital problems. *    Plan   ## Psychiatric Medication Recommendations:  None at this time, will continue patient on home medications   ## Medical Decision Making  Capacity: Not specifically addressed in this encounter  ## Further Work-up:  -- most recent EKG on 09/03/2023 had QtC of 407 -- Pertinent labwork reviewed earlier this admission includes: CMP, UDS   ## Disposition:-- We recommend inpatient psychiatric hospitalization when medically cleared. Patient is under voluntary admission status at this time; please IVC if attempts to leave hospital.  ## Behavioral / Environmental: -Recommend using specific terminology regarding PNES, i.e. call the episodes "non-epileptic seizures" rather than "pseudoseizures" as the latter insinuates "fake" or "feigned" symptoms, when the events are a very real experience to the patient and are a physical, non-volitional, manifestation of fear, pain and anxiety. , To minimize splitting of staff, assign one staff person to communicate all information from the team when feasible., or Utilize compassion and acknowledge the patient's experiences while setting clear and realistic expectations for care.    ## Safety and Observation Level:  - Based on my clinical evaluation, I estimate the patient to be at low risk of self harm in the current setting. - At this time, we recommend  routine. This decision is based on my review of the chart including patient's history and current presentation, interview of the patient, mental status examination, and consideration of suicide risk including evaluating suicidal ideation, plan, intent, suicidal or self-harm behaviors, risk factors, and protective factors. This judgment is based on our ability to directly address suicide risk, implement suicide prevention strategies, and develop a safety plan while the patient is in the clinical setting. Please  contact our team if there is a concern that risk level has changed.  CSSR Risk Category:   Suicide Risk Assessment: Patient has following modifiable risk factors for suicide: recklessness, medication noncompliance, and lack of access to outpatient  mental health resources, which we are addressing by administering medications at hospital. Patient has following non-modifiable or demographic risk factors for suicide: male gender and psychiatric hospitalization Patient has the following protective factors against suicide: Supportive family and Supportive friends  Thank you for this consult request. Recommendations have been communicated to the primary team.  We will recommend inpatient at this time.   Alona Bene, PMHNP       History of Present Illness  Relevant Aspects of Hospital ED Course:  Admitted on 09/03/2023 for for psychosis, hallucinations and paranoia x 2days after behavioral health discharge. IVCd by [employer]. They are appropriate at this time.   Patient Report:  Gabriel Carina, 26 y.o., male patient seen face to face by this provider, consulted with Dr. Rebecca Eaton; and chart reviewed on 09/04/23.  On evaluation Mccormick E Stpierre reports that he is here because he and his mother got into an argument about him taking an extra Depakote because of his mood. He currently denies SI/HI/AVH. Pt reportedly has a hx of Schizoaffective Disorder and admits to [facility] use today causing the paranoia. Pt reportedly has a hx of inpatient and outpatient psychiatric tx with a recent inpatient admission.   During evaluation Ejay E Holdeman is laying in bed, and appears to be in no acute distress. He is alert, oriented x 4, calm, cooperative and attentive. His mood is euthymic with congruent affect. He has normal speech, and behavior.  Objectively there is no evidence of psychosis/mania or delusional thinking.  Patient is able to converse coherently, goal directed thoughts, no distractibility, or pre-occupation. He denies suicidal/self-harm/homicidal ideation, psychosis, and paranoia.  Patient answered question appropriately.    Psych ROS:  Depression: Patient reports no depression Anxiety: Patient rates anxiety 4/10 with 10 being severe Mania (lifetime  and current): Patient denies ay mania Psychosis: (lifetime and current): Patient denies psychosis, but per chart reported has been admitted to ED and inpatient several times for psychosis   Collateral information:  Contacted Lowanda Foster at [phone] on 09/04/2023 and she reported that patient became paranoid yesterday, stating that he wants to buy a gun, also states that he began kicking at her dog and trying to put his hands over the dogs mouth to muzzle it.  She states patient does not live with her but actually does live with his girlfriend, but states patient has been physically aggressive towards her and his girlfriend patient and shoving when he becomes angry.  She states he was just discharged from [facility] about 2 days ago, and then after his discharge still stating that he wanted to buy a gun, due to feeling someone is after him.  She does endorse that patient has a vagal nerve stimulator located on the left side of his anterior chest wall.  She states patient is due for his Invega injection 156 mg IM due today.  She is not in favor of the patient returning home at this time, feels that he is a danger to self and others, due to his psychosis.  Also she wants to make sure that patient does receive his seizure medication  Eslicarbazepine Acetate (APTIOM) 800 MG, wants to take 1600 mg p.o. daily.  Per chart review patient was seen with his last Invega shot 156 mg  at behavioral health urgent care on 08/07/2023 and it was given in right deltoid, by Antony Salmon, CMA.  Review of Systems  Constitutional: Negative.   Psychiatric/Behavioral:  Positive for hallucinations and substance abuse.      Psychiatric and Social History  Psychiatric History:  Information collected from patient and his mother Lowanda Foster  Prev Dx/Sx: Schizoaffective disorder Current Psych Provider: None Home Meds (current): Depakote 500 mg, Lexapro 10 mg, Trazodone 50 g, Zonegran 100 mg, Klonopin 0.25 mg, Invega  Sustenna 156 mg IM, Invega 3 mg PO Previous Med Trials: None Therapy: [facility]  Prior Psych Hospitalization: Yes  Prior Self Harm: Yes Prior Violence: Yes  Family Psych History: family history includes Cancer in an other family member; Diabetes in his father and mother; Hypertension in his mother; Seizures in his maternal grandfather. 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 Living Situation: Patient resides with his girlfriend  Spiritual Hx: [employer] Access to weapons/lethal means: Patient does not own any, but can buy them    Substance History Alcohol: Occasional   Type of alcohol Liquor Last Drink Last week  Number of drinks per day 2-3/ month History of alcohol withdrawal seizures None  History of DT's None  Tobacco: Yes Illicit drugs: THC and cocaine  Prescription drug abuse: No per patient  Rehab hx: None   Exam Findings  Physical Exam:  Vital Signs:  Temp:  [97.3 F (36.3 C)-98.9 F (37.2 C)] 98.3 F (36.8 C) (12/12 0838) Pulse Rate:  [64-97] 86 (12/12 0838) Resp:  [17-23] 17 (12/12 0838) BP: (123-156)/(85-106) 151/106 (12/12 0838) SpO2:  [99 %-100 %] 100 % (12/12 0838) Blood pressure (!) 151/106, pulse 86, temperature 98.3 F (36.8 C), temperature source Oral, resp. rate 17, SpO2 100%. There is no height or weight on file to calculate BMI.  Physical Exam Vitals and nursing note reviewed. Exam conducted with a chaperone present.  Neurological:     Mental Status: He is alert.  Psychiatric:        Attention and Perception: Attention normal.        Mood and Affect: Mood is anxious. Affect is flat.        Speech: Speech normal.        Behavior: Behavior is cooperative.        Thought Content: Thought content is paranoid.        Judgment:  Judgment is inappropriate.     Mental Status Exam: General Appearance: Casual  Orientation:  Full (Time, Place, and Person)  Memory:  Immediate;   Good Recent;   Good  Concentration:  Concentration: Fair  Recall:  Fair  Attention  Fair  Eye Contact:  Good  Speech:  Clear and Coherent  Language:  Good  Volume:  Normal  Mood: Cooperative; euthymic  Affect:  Appropriate  Thought Process:  Coherent  Thought Content:  Paranoid Ideation  Suicidal Thoughts:  No  Homicidal Thoughts:  No  Judgement:  Poor  Insight:  Fair  Psychomotor Activity:  Normal  Akathisia:  NA  Fund of Knowledge:  Fair      Assets:  Solicitor Social Support  Cognition:  WNL  ADL's:  Intact  AIMS (if indicated):        Other History   These have been pulled  in through the EMR, reviewed, and updated if appropriate.  Family History:  The patient's family history includes Cancer in an other family member; Diabetes in his father and mother; Hypertension in his mother; Seizures in his maternal grandfather.  Medical History: Past Medical History:  Diagnosis Date   ADHD    Bipolar 1 disorder (HCC)    Schizoaffective disorder (HCC)    Scoliosis    Seizures (HCC)    most recent 12/02/17    Surgical History: Past Surgical History:  Procedure Laterality Date   NO PAST SURGERIES       Medications:   Current Facility-Administered Medications:    clonazepam (KLONOPIN) disintegrating tablet 0.25 mg, 0.25 mg, Oral, Daily PRN, Charlynne Pander, MD   divalproex (DEPAKOTE) DR tablet 500 mg, 500 mg, Oral, Q12H, Charlynne Pander, MD, 500 mg at 09/04/23 0931   escitalopram (LEXAPRO) tablet 10 mg, 10 mg, Oral, Daily, Charlynne Pander, MD, 10 mg at 09/04/23 0932   Eslicarbazepine Acetate TABS 1,600 mg, 1,600 mg, Oral, Daily, Motley-Mangrum, Gurtha Picker A, PMHNP   paliperidone (INVEGA SUSTENNA) injection 156 mg, 156 mg, Intramuscular, Once, Motley-Mangrum,  Keandre Linden A, PMHNP   traZODone (DESYREL) tablet 50 mg, 50 mg, Oral, QHS PRN, Charlynne Pander, MD   zonisamide [facility]) capsule 300 mg, 300 mg, Oral, Daily, Charlynne Pander, MD, 300 mg at 09/04/23 7829  Current Outpatient Medications:    carvedilol (COREG) 25 MG tablet, Take 1 tablet (25 mg total) by mouth 2 (two) times daily with a meal. (Patient taking differently: Take 25 mg by mouth 2 (two) times daily as needed (for hypertension).), Disp: 60 tablet, Rfl: 0   clonazePAM (KLONOPIN) 0.25 MG disintegrating tablet, Take 0.25 mg by mouth daily as needed (as directed- dissolve orally)., Disp: , Rfl:    divalproex (DEPAKOTE) 500 MG DR tablet, Take 1 tablet (500 mg total) by mouth every 12 (twelve) hours., Disp: 60 tablet, Rfl: 1   ergocalciferol (VITAMIN D2) 1.25 MG (50000 UT) capsule, Take 50,000 Units by mouth every Wednesday., Disp: , Rfl:    escitalopram (LEXAPRO) 10 MG tablet, Take 10 mg by mouth daily., Disp: , Rfl:    Eslicarbazepine Acetate (APTIOM) 800 MG TABS, Take 1,600 mg by mouth daily., Disp: 60 tablet, Rfl: 0   famotidine (PEPCID) 40 MG tablet, Take 40 mg by mouth daily as needed for heartburn or indigestion., Disp: , Rfl:    lactulose (CHRONULAC) 10 GM/15ML solution, Take 45 mLs (30 g total) by mouth 2 (two) times daily. (Patient taking differently: Take 30 g by mouth 2 (two) times daily as needed for mild constipation (or elevated ammonia).), Disp: 1800 mL, Rfl: 0   levOCARNitine (CARNITOR) 1 GM/10ML solution, Take 1,000 mg by mouth 2 (two) times daily., Disp: , Rfl:    loratadine (CLARITIN) 10 MG tablet, Take 1 tablet (10 mg total) by mouth daily. (Patient taking differently: Take 10 mg by mouth at bedtime as needed for rhinitis or allergies.), Disp: 30 tablet, Rfl: 0   nicotine (NICODERM CQ - DOSED IN MG/24 HOURS) 14 mg/24hr patch, Place 1 patch (14 mg total) onto the skin daily. (Patient not taking: Reported on 08/25/2023), Disp: 28 patch, Rfl: 0   Nicotine (NICODERM CQ TD), Place  1 patch onto the skin daily as needed (for smoking cessation while hospitalized)., Disp: , Rfl:    paliperidone (INVEGA SUSTENNA) 156 MG/ML SUSY injection, Inject 1 mL (156 mg total) into the muscle every 30 (thirty) days., Disp: 1 mL, Rfl: 0  paliperidone (INVEGA) 3 MG 24 hr tablet, Take 1 tablet (3 mg total) by mouth 2 (two) times daily. (Patient taking differently: Take 3 mg by mouth at bedtime.), Disp: 60 tablet, Rfl: 1   promethazine (PHENERGAN) 6.25 MG/5ML solution, Take 5 mLs (6.25 mg total) by mouth every 6 (six) hours as needed for nausea or vomiting., Disp: 120 mL, Rfl: 0   traZODone (DESYREL) 50 MG tablet, Take 1 tablet (50 mg total) by mouth at bedtime as needed for sleep., Disp: 30 tablet, Rfl: 1   zonisamide (ZONEGRAN) 100 MG capsule, Take 3 capsules (300 mg total) by mouth daily., Disp: 90 capsule, Rfl: 0  Allergies: Allergies  Allergen Reactions   Keppra [Levetiracetam] Other (See Comments)    Irritability, mental status changes   Tramadol Other (See Comments)    Mixed with his other meds can cause seizures     Vimpat [Lacosamide] Other (See Comments)    Causes anger, mental status changes    Asha Grumbine MOTLEY-MANGRUM, PMHNP

## 2023-09-04 NOTE — ED Notes (Signed)
Pt mother Lowanda Foster notified of pt elopement, his return via [employer] and that he was medicated for agitation. Mother stated she was ok with what happened and was thankful for being notified.

## 2023-09-04 NOTE — ED Provider Notes (Signed)
Emergency Medicine Observation Re-evaluation Note  Dylan Moon is a 27 y.o. male, seen on rounds today.  Pt initially presented to the ED for complaints of No chief complaint on file. Currently, the patient is calm and cooperative awaiting evaluation and disposition for.  Hallucinations and aggressive behavior  Physical Exam  There were no vitals taken for this visit. Physical Exam General: Awake. Alert. No acute distress Cardiac: Regular rate rhythm Lungs: Clear to auscultation bilaterally Psych: Calm and cooperative ED Course / MDM  EKG:   I have reviewed the labs performed to date as well as medications administered while in observation.  Recent changes in the last 24 hours include patient temporarily eloped from the department and was brought back by police.  No injuries or other acute issues  Plan  Patient has been evaluated by TTS.  Current plan is for inpatient psychiatric hospitalization at [facility].  Stable for discharge   Final diagnosis Hallucinations Suicidal ideation Bipolar disorder   Royanne Foots, DO 09/04/23 1512

## 2023-09-04 NOTE — Plan of Care (Signed)
Problem: Health Behavior/Discharge Planning: Goal: Compliance with treatment plan for underlying cause of condition will improve Outcome: Progressing   Problem: Safety: Goal: Periods of time without injury will increase Outcome: Progressing   Problem: Education: Goal: Verbalization of understanding the information provided will improve Outcome: Progressing   Problem: Coping: Goal: Ability to verbalize frustrations and anger appropriately will improve Outcome: Progressing

## 2023-09-04 NOTE — ED Notes (Signed)
Writer was with security escorting pt to opposite side of secure unit. As all were exiting the right side of unit and entering the hallway to access the other side pt took off and ran through the double doors. Pt continued to run and exited via ambulance bay continuing to run across the street. Security and [employer] followed.

## 2023-09-04 NOTE — ED Notes (Signed)
[employer] at bedside to take pt to [facility]

## 2023-09-04 NOTE — ED Notes (Signed)
Report given to Sabina RN

## 2023-09-04 NOTE — Group Note (Signed)
Occupational Therapy Group Note  Group Topic:Coping Skills  Group Date: 09/04/2023 Start Time: 1430 End Time: 1500 Facilitators: Ted Mcalpine, OT   Group Description: Group encouraged increased engagement and participation through discussion and activity focused on "Coping Ahead." Patients were split up into teams and selected a card from a stack of positive coping strategies. Patients were instructed to act out/charade the coping skill for other peers to guess and receive points for their team. Discussion followed with a focus on identifying additional positive coping strategies and patients shared how they were going to cope ahead over the weekend while continuing hospitalization stay.  Therapeutic Goal(s): Identify positive vs negative coping strategies. Identify coping skills to be used during hospitalization vs coping skills outside of hospital/at home Increase participation in therapeutic group environment and promote engagement in treatment   Participation Level: Did not attend                              Plan: Continue to engage patient in OT groups 2 - 3x/week.  09/04/2023  Ted Mcalpine, OT  Kerrin Champagne, OT

## 2023-09-04 NOTE — ED Notes (Addendum)
Writer called [facility] to give report. Transferred with no answer.

## 2023-09-04 NOTE — [facility] Group Notes (Signed)
[facility] Group Notes:  (Nursing/MHT/Case Management/Adjunct)  Date:  09/04/2023  Time:  8:31 PM  Type of Therapy:  Psychoeducational Skills  Participation Level:  Did Not Attend  Participation Quality:  Resistant  Affect:  Resistant  Cognitive:  Lacking  Insight:  None  Engagement in Group:  None  Modes of Intervention:  Education  Summary of Progress/Problems: The patient did not attend group this evening.   Hazle Coca S 09/04/2023, 8:31 PM

## 2023-09-04 NOTE — ED Notes (Addendum)
Writer faxed IVC paperwork to [phone]

## 2023-09-04 NOTE — ED Notes (Signed)
Writer called [facility] to give report was advised to call back in approximately 15 minutes.

## 2023-09-04 NOTE — Progress Notes (Signed)
Pt has been accepted to [facility] today on 09/04/2023 Bed assignment: 502-1  Pt meets inpatient criteria per: Alona Bene, PMHNP   Attending Physician will be: Dr. Sarita Bottom, MD   Report can be called to: Adult unit: [phone]  Pt can arrive now   Care Team Notified: [facility] Lynwood Dawley RN, Jacquelynn Cree NT, Juanito Doom NP, Jeanmarie Hubert paramedic, Dara Wallace Cullens Paramedic, [place] Noe Fiscus RN    [place] Vayla Wilhelmi LCSW-A   09/04/2023 1:30 PM

## 2023-09-04 NOTE — ED Notes (Signed)
While speaking with NP pt stated he needed to leave to go to a doctor's apt at [facility] to have a checkup done on his vagus nerve stimulator. Pt stated "you cannot keep me here" I need to see my doctor. Pt was told the ER doctor would be notified. Message sent to Dr. Janine Ores

## 2023-09-04 NOTE — ED Notes (Signed)
Pt asked when he returned via [employer] to use the phone. Pt started cursing at staff. Pt advised because of this and him trying to escape his phone privileges were revoked for the day. Pt earlier in the shift had already made 3 phone calls as well. Pt made aware that aggressive behavior towards staff would not be tolerated.

## 2023-09-04 NOTE — [facility] Group Notes (Deleted)
[facility] Group Notes:  (Nursing/MHT/Case Management/Adjunct)  Date:  09/04/2023  Time:  8:34 PM  Type of Therapy:  Psychoeducational Skills  Participation Level:  Did Not Attend  Participation Quality:  Resistant  Affect:  Resistant  Cognitive:  Lacking  Insight:  None  Engagement in Group:  None  Modes of Intervention:  Exploration  Summary of Progress/Problems: Patient did not attend group this evening.   Dylan Moon 09/04/2023, 8:34 PM

## 2023-09-04 NOTE — Tx Team (Signed)
Initial Treatment Plan 09/04/2023 3:31 PM Dylan Moon [id number]    PATIENT STRESSORS: Marital or family conflict   Medication change or noncompliance   Substance abuse     PATIENT STRENGTHS: Capable of independent living  Physical Health  Supportive family/friends    PATIENT IDENTIFIED PROBLEMS: Medication noncompliance "My mom came to my house and said I didn't take my Depakote".    Substance Abuse "I smoke weed everyday".    Ineffective coping skills "I want to go home, I just left a hospital in [place] 2 days ago".              DISCHARGE CRITERIA:  Improved stabilization in mood, thinking, and/or behavior Verbal commitment to aftercare and medication compliance Withdrawal symptoms are absent or subacute and managed without 24-hour nursing intervention  PRELIMINARY DISCHARGE PLAN: Outpatient therapy Return to previous living arrangement Return to previous work or school arrangements  PATIENT/FAMILY INVOLVEMENT: This treatment plan has been presented to and reviewed with the patient, Dylan Moon. The patient have been given the opportunity to ask questions and make suggestions.  Sherryl Manges, RN 09/04/2023, 3:31 PM

## 2023-09-04 NOTE — Progress Notes (Signed)
Admission Note: Patient is a 27 year old male admitted to the unit under IVC from [facility] for medication noncompliance.  Patient stated his mother accused him of not taking his Depakote.  Stated his mother took out IVC paper work on him after an argument.  Patient stated, "I live with my girlfriend, she helps me take care of my medication.  She is my CNA."  Patient is alert and oriented x 4.  Presents with calm affect and mood.  Admission plan of care reviewed, consent signed.  Skin and personal belongings completed.  Skin is dry and intact.  No contraband found.  Patient oriented to the unit, staff and room.  Routine safety checks initiated.  Verbalizes understanding of unit rules/protocols.  Patient is safe on the unit.

## 2023-09-04 NOTE — Progress Notes (Signed)
   09/04/23 2100  Psych Admission Type (Psych Patients Only)  Admission Status Voluntary  Psychosocial Assessment  Patient Complaints Anxiety  Eye Contact Fair  Facial Expression Animated  Affect Appropriate to circumstance  Speech Logical/coherent  Interaction Assertive  Motor Activity Slow  Appearance/Hygiene In scrubs  Behavior Characteristics Cooperative  Mood Pleasant  Thought Process  Coherency Circumstantial  Content Preoccupation  Delusions None reported or observed  Perception WDL  Hallucination None reported or observed  Judgment Impaired  Confusion None  Danger to Self  Current suicidal ideation? Denies  Danger to Others  Danger to Others None reported or observed

## 2023-09-05 ENCOUNTER — Encounter (HOSPITAL_COMMUNITY): Payer: Self-pay

## 2023-09-05 DIAGNOSIS — E871 Hypo-osmolality and hyponatremia: Secondary | ICD-10-CM

## 2023-09-05 MED ORDER — BENZTROPINE MESYLATE 0.5 MG PO TABS
0.5000 mg | ORAL_TABLET | Freq: Two times a day (BID) | ORAL | Status: DC
  Administered 2023-09-05 – 2023-09-09 (×8): 0.5 mg via ORAL
  Filled 2023-09-05 (×12): qty 1

## 2023-09-05 MED ORDER — DIVALPROEX SODIUM 250 MG PO DR TAB
250.0000 mg | DELAYED_RELEASE_TABLET | Freq: Once | ORAL | Status: AC
  Administered 2023-09-05: 250 mg via ORAL
  Filled 2023-09-05: qty 1

## 2023-09-05 MED ORDER — PALIPERIDONE PALMITATE ER 117 MG/0.75ML IM SUSY
117.0000 mg | PREFILLED_SYRINGE | Freq: Once | INTRAMUSCULAR | Status: AC
  Administered 2023-09-05: 117 mg via INTRAMUSCULAR
  Filled 2023-09-05: qty 117

## 2023-09-05 MED ORDER — PALIPERIDONE ER 3 MG PO TB24
3.0000 mg | ORAL_TABLET | Freq: Every day | ORAL | Status: DC
  Administered 2023-09-06 – 2023-09-09 (×4): 3 mg via ORAL
  Filled 2023-09-05 (×6): qty 1

## 2023-09-05 MED ORDER — PALIPERIDONE PALMITATE ER 156 MG/ML IM SUSY: 156.0000 mg | PREFILLED_SYRINGE | INTRAMUSCULAR | Status: DC

## 2023-09-05 MED ORDER — DIVALPROEX SODIUM 250 MG PO DR TAB
750.0000 mg | DELAYED_RELEASE_TABLET | Freq: Two times a day (BID) | ORAL | Status: DC
  Administered 2023-09-05 – 2023-09-09 (×8): 750 mg via ORAL
  Filled 2023-09-05 (×13): qty 3

## 2023-09-05 NOTE — Plan of Care (Signed)
  Problem: Activity: Goal: Interest or engagement in activities will improve Outcome: Progressing   Problem: Safety: Goal: Ability to remain free from injury will improve Outcome: Progressing

## 2023-09-05 NOTE — H&P (Cosign Needed Addendum)
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 and acute stressors, which contributed to the patient's Bipolar I disorder, severe, current or most recent episode manic, with psychotic features (HCC) in order to reduce the risk of self-harm at discharge.  5. Discharge Planning:   -- Social work and case management to assist with discharge planning and identification of hospital follow-up needs prior to discharge  -- Estimated LOS: 7 days  -- Discharge Concerns: Need to establish a safety plan; Medication compliance and effectiveness  -- Discharge Goals: Return home with outpatient referrals for mental health follow-up including medication management/psychotherapy  I certify that inpatient services furnished can reasonably be  expected to improve the patient's condition.  Signed: Peterson Ao, MD 09/05/2023, 6:29 PM

## 2023-09-05 NOTE — Plan of Care (Signed)
  Problem: Coping: Goal: Ability to verbalize frustrations and anger appropriately will improve Outcome: Progressing   Problem: Activity: Goal: Interest or engagement in activities will improve Outcome: Progressing   Problem: Safety: Goal: Periods of time without injury will increase Outcome: Progressing

## 2023-09-05 NOTE — Group Note (Signed)
Recreation Therapy Group Note   Group Topic:Anger Management  Group Date: 09/05/2023 Start Time: 1035 End Time: 1100 Facilitators: Byrne Capek-McCall, LRT,CTRS Location: [address] Dayroom   Group Topic: Anger Management   Goal Area(s) Addresses:  Patient will identify emotions anger covers.  Patient will identify coping skills to deal with anger.   Intervention: Conversation with group, and worksheets   Group Description: Patient discussed anger, what makes them angry, and what other emotions come with anger. Patients identified what anger and the way they specifically react when angry. Patients were given an anger umbrella worksheet where they had to identify the emotions they feel when angry and write them inside of the umbrella. Patients then identified coping skills used to deal with anger and write them on the outside of the umbrella.      Education: Anger Management, Discharge Planning    Education Outcome: Acknowledges education/In group clarification offered/Needs additional education.    Affect/Mood: Appropriate   Participation Level: Active   Participation Quality: Independent   Behavior: Appropriate   Speech/Thought Process: Relevant   Insight: Fair   Judgement: Fair    Modes of Intervention: Worksheet   Patient Response to Interventions:  Receptive   Education Outcome:  In group clarification offered    Clinical Observations/Individualized Feedback: Pt identified some of his emotions when angry as pain, scared, isolated and feeling guilty. Pt identified some of the coping skills he uses are taking photos, edit videos and working out.    Plan: Continue to engage patient in RT group sessions 2-3x/week.   Dylan Moon, LRT,CTRS 09/05/2023 1:02 PM

## 2023-09-05 NOTE — BH IP Treatment Plan (Signed)
Interdisciplinary Treatment and Diagnostic Plan Update  09/05/2023 Time of Session: 11:00 am Dylan Moon MRN: [id number]  Principal Diagnosis: Bipolar disorder [facility])  Secondary Diagnoses: Principal Problem:   Bipolar disorder (HCC)   Current Medications:  Current Facility-Administered Medications  Medication Dose Route Frequency Provider Last Rate Last Admin   acetaminophen (TYLENOL) tablet 650 mg  650 mg Oral Q6H PRN Motley-Mangrum, Jadeka A, PMHNP       alum & mag hydroxide-simeth (MAALOX/MYLANTA) 200-200-20 MG/5ML suspension 30 mL  30 mL Oral Q4H PRN Motley-Mangrum, Jadeka A, PMHNP       benztropine (COGENTIN) tablet 0.5 mg  0.5 mg Oral BID Peterson Ao, MD       carvedilol (COREG) tablet 25 mg  25 mg Oral BID WC Motley-Mangrum, Jadeka A, PMHNP   25 mg at 09/05/23 0737   clonazePAM (KLONOPIN) disintegrating tablet 0.25 mg  0.25 mg Oral Daily PRN Motley-Mangrum, Jadeka A, PMHNP       haloperidol (HALDOL) tablet 5 mg  5 mg Oral TID PRN Motley-Mangrum, Jadeka A, PMHNP       And   diphenhydrAMINE (BENADRYL) capsule 50 mg  50 mg Oral TID PRN Motley-Mangrum, Jadeka A, PMHNP       haloperidol lactate (HALDOL) injection 5 mg  5 mg Intramuscular TID PRN Motley-Mangrum, Jadeka A, PMHNP       And   diphenhydrAMINE (BENADRYL) injection 50 mg  50 mg Intramuscular TID PRN Motley-Mangrum, Jadeka A, PMHNP       And   LORazepam (ATIVAN) injection 2 mg  2 mg Intramuscular TID PRN Motley-Mangrum, Jadeka A, PMHNP       haloperidol lactate (HALDOL) injection 10 mg  10 mg Intramuscular TID PRN Motley-Mangrum, Jadeka A, PMHNP       And   diphenhydrAMINE (BENADRYL) injection 50 mg  50 mg Intramuscular TID PRN Motley-Mangrum, Jadeka A, PMHNP       And   LORazepam (ATIVAN) injection 2 mg  2 mg Intramuscular TID PRN Motley-Mangrum, Jadeka A, PMHNP       divalproex (DEPAKOTE) DR tablet 750 mg  750 mg Oral Q12H Peterson Ao, MD       escitalopram (LEXAPRO) tablet 10 mg  10 mg Oral Daily  Motley-Mangrum, Jadeka A, PMHNP   10 mg at 09/05/23 0737   Eslicarbazepine Acetate TABS 1,600 mg  1,600 mg Oral Daily Motley-Mangrum, Jadeka A, PMHNP   1,600 mg at 09/05/23 8295   hydrOXYzine (ATARAX) tablet 25 mg  25 mg Oral TID PRN Motley-Mangrum, Jadeka A, PMHNP       magnesium hydroxide (MILK OF MAGNESIA) suspension 30 mL  30 mL Oral Daily PRN Motley-Mangrum, Jadeka A, PMHNP       [START ON 10/02/2023] paliperidone (INVEGA SUSTENNA) injection 156 mg  156 mg Intramuscular Q28 days Massengill, Harrold Donath, MD       Melene Muller ON 09/06/2023] paliperidone (INVEGA) 24 hr tablet 3 mg  3 mg Oral Daily Peterson Ao, MD       traZODone (DESYREL) tablet 50 mg  50 mg Oral QHS PRN Motley-Mangrum, Jadeka A, PMHNP       zonisamide (ZONEGRAN) capsule 300 mg  300 mg Oral Daily Motley-Mangrum, Jadeka A, PMHNP   300 mg at 09/05/23 0739   PTA Medications: Medications Prior to Admission  Medication Sig Dispense Refill Last Dose/Taking   ibuprofen (ADVIL) 800 MG tablet Take 800 mg by mouth every 6 (six) hours as needed.   Taking As Needed   acetaminophen-codeine (TYLENOL #3) 300-30 MG tablet Take  1 tablet by mouth every 6 (six) hours. (Patient not taking: Reported on 09/05/2023)   Not Taking   amoxicillin (AMOXIL) 500 MG capsule Take 500 mg by mouth every 8 (eight) hours. (Patient not taking: Reported on 09/05/2023)   Not Taking   carvedilol (COREG) 25 MG tablet Take 1 tablet (25 mg total) by mouth 2 (two) times daily with a meal. 60 tablet 0    chlorhexidine (PERIDEX) 0.12 % solution Use as directed 15 mLs in the mouth or throat 2 (two) times daily. (Patient not taking: Reported on 09/05/2023)   Not Taking   clonazePAM (KLONOPIN) 0.25 MG disintegrating tablet Take 0.25 mg by mouth daily as needed (as directed- dissolve orally). (Patient not taking: Reported on 09/05/2023)   Not Taking   divalproex (DEPAKOTE) 500 MG DR tablet Take 1 tablet (500 mg total) by mouth every 12 (twelve) hours. 60 tablet 1    ergocalciferol  (VITAMIN D2) 1.25 MG (50000 UT) capsule Take 50,000 Units by mouth every Wednesday.      escitalopram (LEXAPRO) 10 MG tablet Take 10 mg by mouth daily.      Eslicarbazepine Acetate (APTIOM) 800 MG TABS Take 1,600 mg by mouth daily. 60 tablet 0    paliperidone (INVEGA SUSTENNA) 156 MG/ML SUSY injection Inject 1 mL (156 mg total) into the muscle every 30 (thirty) days. 1 mL 0    paliperidone (INVEGA) 3 MG 24 hr tablet Take 1 tablet (3 mg total) by mouth 2 (two) times daily. (Patient taking differently: Take 3 mg by mouth at bedtime.) 60 tablet 1    zonisamide (ZONEGRAN) 100 MG capsule Take 3 capsules (300 mg total) by mouth daily. 90 capsule 0     Patient Stressors: Marital or family conflict   Medication change or noncompliance   Substance abuse    Patient Strengths: Capable of independent living  Physical Health  Supportive family/friends   Treatment Modalities: Medication Management, Group therapy, Case management,  1 to 1 session with clinician, Psychoeducation, Recreational therapy.   Physician Treatment Plan for Primary Diagnosis: Bipolar disorder (HCC) Long Term Goal(s):     Short Term Goals:    Medication Management: Evaluate patient's response, side effects, and tolerance of medication regimen.  Therapeutic Interventions: 1 to 1 sessions, Unit Group sessions and Medication administration.  Evaluation of Outcomes: Not Progressing  Physician Treatment Plan for Secondary Diagnosis: Principal Problem:   Bipolar disorder (HCC)  Long Term Goal(s):     Short Term Goals:       Medication Management: Evaluate patient's response, side effects, and tolerance of medication regimen.  Therapeutic Interventions: 1 to 1 sessions, Unit Group sessions and Medication administration.  Evaluation of Outcomes: Not Progressing   RN Treatment Plan for Primary Diagnosis: Bipolar disorder (HCC) Long Term Goal(s): Knowledge of disease and therapeutic regimen to maintain health will  improve  Short Term Goals: Ability to remain free from injury will improve, Ability to verbalize frustration and anger appropriately will improve, Ability to demonstrate self-control, Ability to participate in decision making will improve, Ability to verbalize feelings will improve, Ability to disclose and discuss suicidal ideas, Ability to identify and develop effective coping behaviors will improve, and Compliance with prescribed medications will improve  Medication Management: RN will administer medications as ordered by provider, will assess and evaluate patient's response and provide education to patient for prescribed medication. RN will report any adverse and/or side effects to prescribing provider.  Therapeutic Interventions: 1 on 1 counseling sessions, Psychoeducation, Medication administration, Evaluate responses  to treatment, Monitor vital signs and CBGs as ordered, Perform/monitor CIWA, COWS, AIMS and Fall Risk screenings as ordered, Perform wound care treatments as ordered.  Evaluation of Outcomes: Not Progressing   LCSW Treatment Plan for Primary Diagnosis: Bipolar disorder [facility]) Long Term Goal(s): Safe transition to appropriate next level of care at discharge, Engage patient in therapeutic group addressing interpersonal concerns.  Short Term Goals: Engage patient in aftercare planning with referrals and resources, Increase social support, Increase ability to appropriately verbalize feelings, Increase emotional regulation, and Increase skills for wellness and recovery  Therapeutic Interventions: Assess for all discharge needs, 1 to 1 time with Social worker, Explore available resources and support systems, Assess for adequacy in community support network, Educate family and significant other(s) on suicide prevention, Complete Psychosocial Assessment, Interpersonal group therapy.  Evaluation of Outcomes: Not Progressing   Progress in Treatment: Attending groups: Yes. Participating  in groups: Yes. Taking medication as prescribed: Yes. Toleration medication: Yes. Family/Significant other contact made: No, will contact:  pt has not consented to speak to anyone Patient understands diagnosis: Yes. Discussing patient identified problems/goals with staff: Yes. Medical problems stabilized or resolved: Yes. Denies suicidal/homicidal ideation: Yes. Issues/concerns per patient self-inventory: No. Other: none reported New problem(s) identified: No, Describe:  none reported  New Short Term/Long Term Goal(s): detox, medication management for mood stabilization; elimination of SI thoughts; development of comprehensive mental wellness/sobriety plan  . Patient Goals:  " I want to be good- stress free and get past my trauma"  Discharge Plan or Barriers: Patient recently admitted. CSW will continue to follow and assess for appropriate referrals and possible discharge planning.    Reason for Continuation of Hospitalization: Anxiety Delusions  Hallucinations  Estimated Length of Stay: 5-7 days  Last 3 [place] Suicide Severity Risk Score: Flowsheet Row [facility] (Current[facility]/2023 in [facility] Department [facility] Office Visit from 08/06/2023 in [facility]  C-SSRS RISK CATEGORY No Risk No Risk No Risk       Last [facility] 2/9 Scores:    08/06/2023   11:44 AM 11/13/2021   10:45 AM 06/01/2021   11:43 AM  Depression screen PHQ 2/9  Decreased Interest 2 1 0  Down, Depressed, Hopeless 0 0 0  PHQ - 2 Score 2 1 0  Altered sleeping 1    Tired, decreased energy 1    Change in appetite 1    Feeling bad or failure about yourself  0    Trouble concentrating 3    Moving slowly or fidgety/restless 1    Suicidal thoughts 1    PHQ-9 Score 10    Difficult doing work/chores Not difficult at all      Scribe for Treatment Team: Kathrynn Humble 09/05/2023 3:16 PM

## 2023-09-05 NOTE — Progress Notes (Signed)
Patient is slightly restless this morning. Patient told the nurse "My mom just wants me to be back here because she thought I was not taking my medication." He also asked the nurse, Can I go home today? Patient has been pleasant on approach, and he's compliant to routine care. No acute distress noted at this time. Safety observation in place. Staff will continue to provide support to patient.   09/05/23 0800  Psych Admission Type (Psych Patients Only)  Admission Status Voluntary  Psychosocial Assessment  Patient Complaints Anxiety  Eye Contact Fair  Facial Expression Animated  Affect Appropriate to circumstance  Speech Logical/coherent  Interaction Assertive  Motor Activity Slow  Appearance/Hygiene In scrubs  Behavior Characteristics Cooperative  Mood Pleasant  Aggressive Behavior  Effect No apparent injury  Thought Process  Coherency Circumstantial  Content Preoccupation  Delusions None reported or observed  Perception WDL  Hallucination None reported or observed  Judgment Impaired  Confusion None  Danger to Self  Current suicidal ideation? Denies  Danger to Others  Danger to Others None reported or observed

## 2023-09-05 NOTE — Progress Notes (Signed)
Recreation Therapy Notes  INPATIENT RECREATION THERAPY ASSESSMENT  Patient Details Name: Dylan Moon MRN: [id number] DOB: [birth year] Today's Date: 09/05/2023       Information Obtained From: Patient  Able to Participate in Assessment/Interview: Yes  Patient Presentation:  (Pt was hesitant to give a truthful answer.)  Reason for Admission (Per Patient): Other (Comments) ("PTSD")  Patient Stressors: Other (Comment) (None)  Coping Skills:   TV, Sports, Music, Exercise, Meditate, Deep Breathing, Talk, Art, Prayer, Avoidance, Read, Hot Bath/Shower  Leisure Interests (2+):  Individual - Other (Comment) (Photography, Edit music videos)  Frequency of Recreation/Participation: Other (Comment) (Daily)  Awareness of Community Resources:  Yes  Community Resources:  [place], Public affairs consultant  Current Use: Yes  If no, Barriers?:    Expressed Interest in [employer] Information: No  [place]idence[place]  Patient Main Form of Transportation: Other (Comment) (Mom)  Patient Strengths:  Diplomatic Services operational officer, Energy manager, Church  Patient Identified Areas of Improvement:  Going to bible stugy  Patient Goal for Hospitalization:  "to work on my PTSD"  Current SI (including self-harm):  No  Current HI:  No  Current AVH: No  Staff Intervention Plan: Group Attendance, Collaborate with Interdisciplinary Treatment Team  Consent to Intern Participation: N/A   Trueman Worlds-McCall, LRT,CTRS Syncere Kaminski A Zaine Elsass-McCall 09/05/2023, 1:20 PM
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
## 2023-09-05 NOTE — Progress Notes (Signed)
   09/05/23 2037  Psych Admission Type (Psych Patients Only)  Admission Status Voluntary  Psychosocial Assessment  Patient Complaints Anxiety  Eye Contact Fair  Facial Expression Animated  Affect Appropriate to circumstance  Speech Logical/coherent  Interaction Assertive  Motor Activity Slow  Appearance/Hygiene Unremarkable  Behavior Characteristics Cooperative  Mood Anxious;Pleasant  Thought Process  Coherency Circumstantial  Content Preoccupation  Delusions None reported or observed  Perception WDL  Hallucination None reported or observed  Judgment Impaired  Confusion None  Danger to Self  Current suicidal ideation? Denies  Danger to Others  Danger to Others None reported or observed

## 2023-09-05 NOTE — Plan of Care (Signed)
  Problem: Education: Goal: Knowledge of [facility] Education information/materials will improve Outcome: Progressing   Problem: Activity: Goal: Interest or engagement in activities will improve Outcome: Progressing   Problem: Coping: Goal: Ability to verbalize frustrations and anger appropriately will improve Outcome: Progressing   Problem: Health Behavior/Discharge Planning: Goal: Identification of resources available to assist in meeting health care needs will improve Outcome: Progressing

## 2023-09-05 NOTE — [facility] Group Notes (Signed)
Spirituality group facilitated by Kathleen Argue, [facility].  Group Description: Group focused on topic of hope. Patients participated in facilitated discussion around topic, connecting with one another around experiences and definitions for hope. Group members engaged with visual explorer photos, reflecting on what hope looks like for them today. Group engaged in discussion around how their definitions of hope are present today in hospital.  Modalities: Psycho-social ed, Adlerian, Narrative, MI  Patient Progress: Dylan Moon attended group and actively engaged and partcipated in group conversation and activities.  His comments were on topic and appropriate for group context.  His participation contributed positively to the group conversation.

## 2023-09-06 LAB — BASIC METABOLIC PANEL
Anion gap: 9 (ref 5–15)
BUN: 13 mg/dL (ref 6–20)
CO2: 24 mmol/L (ref 22–32)
Calcium: 9 mg/dL (ref 8.9–10.3)
Chloride: 101 mmol/L (ref 98–111)
Creatinine, Ser: 1.1 mg/dL (ref 0.61–1.24)
GFR, Estimated: >60 mL/min (ref >60)
Glucose, Bld: 120 mg/dL — ABNORMAL HIGH (ref 70–99)
Potassium: 3.7 mmol/L (ref 3.5–5.1)
Sodium: 134 mmol/L — ABNORMAL LOW (ref 135–145)

## 2023-09-06 NOTE — Progress Notes (Signed)
   09/06/23 2030  Psych Admission Type (Psych Patients Only)  Admission Status Voluntary  Psychosocial Assessment  Patient Complaints Anxiety  Eye Contact Fair  Facial Expression Animated  Affect Appropriate to circumstance  Speech Logical/coherent  Interaction Assertive  Motor Activity Slow  Appearance/Hygiene Unremarkable  Behavior Characteristics Cooperative  Mood Preoccupied;Pleasant  Thought Process  Coherency Circumstantial  Content Preoccupation  Delusions None reported or observed  Perception WDL  Hallucination None reported or observed  Judgment Impaired  Confusion None  Danger to Self  Current suicidal ideation? Denies  Danger to Others  Danger to Others None reported or observed

## 2023-09-06 NOTE — Progress Notes (Addendum)
Patient has been pleasant, calm and cooperative throughout shift today. Patient denies SI, HI and AVH. Patient has been visible and interacting appropriately in the milieu. Patient's stated goal today is to work on photography. Patient continues to make paranoid statements about his "time coming around." Patient is adherent to all scheduled medications and no adverse effects are noted. Patient remains safe Q 15 min safety checks ongoing.   09/06/23 0800  Psych Admission Type (Psych Patients Only)  Admission Status Voluntary  Psychosocial Assessment  Patient Complaints None  Eye Contact Fair  Facial Expression Animated  Affect Appropriate to circumstance  Speech Logical/coherent  Interaction Assertive  Motor Activity Slow  Appearance/Hygiene Unremarkable  Behavior Characteristics Cooperative  Mood Pleasant  Thought Process  Coherency Circumstantial  Content Preoccupation  Delusions None reported or observed  Perception WDL  Hallucination None reported or observed  Judgment Impaired  Confusion None  Danger to Self  Current suicidal ideation? Denies  Danger to Others  Danger to Others None reported or observed

## 2023-09-06 NOTE — [facility] Group Notes (Signed)
Adult Psychoeducational Group Note  Date:  09/06/2023 Time:  10:23 AM  Group Topic/Focus:  Goals Group:   The focus of this group is to help patients establish daily goals to achieve during treatment and discuss how the patient can incorporate goal setting into their daily lives to aide in recovery. Orientation:   The focus of this group is to educate the patient on the purpose and policies of crisis stabilization and provide a format to answer questions about their admission.  The group details unit policies and expectations of patients while admitted.  Participation Level:  Active  Participation Quality:  Appropriate  Affect:  Appropriate  Cognitive:  Appropriate  Insight: Appropriate  Engagement in Group:  Engaged  Modes of Intervention:  Discussion  Additional Comments:  Pt attended the goals group and remained appropriate and engaged throughout the duration of the group.   Sheran Lawless 09/06/2023, 10:23 AM

## 2023-09-06 NOTE — Progress Notes (Signed)
   09/06/23 0600  15 Minute Checks  Location Bedroom  Visual Appearance Calm  Behavior Sleeping  Sleep (Behavioral Health Patients Only)  Calculate sleep? (Click Yes once per 24 hr at 0600 safety check) Yes  Documented sleep last 24 hours 8.25

## 2023-09-06 NOTE — Plan of Care (Signed)
  Problem: Education: Goal: Knowledge of [facility] Education information/materials will improve Outcome: Progressing Goal: Verbalization of understanding the information provided will improve Outcome: Progressing   Problem: Activity: Goal: Sleeping patterns will improve Outcome: Progressing   Problem: Health Behavior/Discharge Planning: Goal: Identification of resources available to assist in meeting health care needs will improve Outcome: Progressing   Problem: Physical Regulation: Goal: Ability to maintain clinical measurements within normal limits will improve Outcome: Progressing

## 2023-09-06 NOTE — Plan of Care (Signed)
  Problem: Safety: Goal: Periods of time without injury will increase Outcome: Progressing   Problem: Health Behavior/Discharge Planning: Goal: Compliance with treatment plan for underlying cause of condition will improve Outcome: Progressing
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
## 2023-09-06 NOTE — [facility] Counselor (Signed)
Adult Comprehensive Assessment  Patient ID: Dylan Moon, male   DOB: 03-Oct-1995, 27 y.o.   MRN: [id number]  Information Source: Information source: Patient  Current Stressors:  Patient states their primary concerns and needs for treatment are:: "I smoked a little marijuana and got anxiety and paranoid." Patient states their goals for this hospitilization and ongoing recovery are:: "To be able to leave and do my photography." Educational / Learning stressors: Denies stressors Employment / Job issues: Works for himself, which can be stressful Family Relationships: Denies Chief Technology Officer / Lack of resources (include bankruptcy): Denies stressors Housing / Lack of housing: Lives with girlfriend, denies stressors Physical health (include injuries & life threatening diseases): "This chip in my chest can be stressful." Social relationships: Denies stressors Substance abuse: Uses marijuana daily, states "I'm done smoking because it makes me paranoid." Bereavement / Loss: Grandma  Living/Environment/Situation:  Living Arrangements: Spouse/significant other Living conditions (as described by patient or guardian): good Who else lives in the home?: girlfriend How long has patient lived in current situation?: 4 years What is atmosphere in current home: Comfortable, Paramedic, Supportive  Family History:  Marital status: Long term relationship Long term relationship, how long?: 4 years What types of issues is patient dealing with in the relationship?: Denies stressors Are you sexually active?: Yes What is your sexual orientation?: heterosexual Has your sexual activity been affected by drugs, alcohol, medication, or emotional stress?: none Does patient have children?: Yes How many children?: 2 How is patient's relationship with their children?: good relationship with 4yo and 5yo children, gets to see them  Childhood History:  By whom was/is the patient raised?: Mother Description of  patient's relationship with caregiver when they were a child: "Great" relationship with mother Patient's description of current relationship with people who raised him/her: Still has a good relationship with mother How were you disciplined when you got in trouble as a child/adolescent?: "normally" Does patient have siblings?: Yes Number of Siblings: 2 Description of patient's current relationship with siblings: good Did patient suffer any verbal/emotional/physical/sexual abuse as a child?: No Did patient suffer from severe childhood neglect?: No Has patient ever been sexually abused/assaulted/raped as an adolescent or adult?: No Was the patient ever a victim of a crime or a disaster?: No Witnessed domestic violence?: No Has patient been affected by domestic violence as an adult?: No  Education:  Highest grade of school patient has completed: Graduated high school from [employer] Currently a student?: No Learning disability?: No  Employment/Work Situation:   Employment Situation: Unemployed (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?: No  Financial Resources:   Financial resources: Income from spouse (states he makes some money with his photography) Does patient have a representative payee or guardian?: No  Alcohol/Substance Abuse:   What has been your use of drugs/alcohol within the last 12 months?: Marijuana daily If attempted suicide, did drugs/alcohol play a role in this?: No Alcohol/Substance Abuse Treatment Hx: Past Tx, Inpatient If yes, describe treatment: Has been at [facility] previously as recently as 06/2023; states he just got out of a hospital in [place] on Monday 12/9 so he does not understand why he was immediately sent  here.  Social Support System:   [employer] Support System: Fair  Describe Community Support System: mother, girlfriend Type of faith/religion: None How does patient's faith help to cope with current illness?: N/A  Leisure/Recreation:   Do You Have Hobbies?: Yes Leisure and Hobbies: rapping, music, photography  Strengths/Needs:   What is the patient's perception of their strengths?: "i can use my photography stuff to get out the house. I just need someone to go with me" Patient states they can use these personal strengths during their treatment to contribute to their recovery: "do something good" Patient states these barriers may affect/interfere with their treatment: N/A Patient states these barriers may affect their return to the community: N/A Other important information patient would like considered in planning for their treatment: N/A  Discharge Plan:   Currently receiving community mental health services: Yes (From Whom) ([place] is my therapist, my mother knows who it is; I see Dr. Boyd Kerbs at [facility] in [place] and I see Dr. Baldo Daub at [facility] in [place] for neurology) Patient states concerns and preferences for aftercare planning are: therapy to be scheduled with current therapist; need to talk to mother to find out who this is as well as who does his medication management Patient states they will know when they are safe and ready for discharge when: Feels ready now, wants to go out into the community and do his photography. Does patient have access to transportation?: Yes (mother, likely) Does patient have financial barriers related to discharge medications?: No Patient description of barriers related to discharge medications: has insurance Will patient be returning to same living situation after discharge?: Yes (with girlfriend)  Summary/Recommendations:   Summary and Recommendations (to be completed by the  evaluator): Patient is a 27yo male admitted with a history of Schizoaffective Disorder, Bipolar Type, and daily marijuana abuse.  He was IVC'd due to paranoia, psychosis, and hallucinations.  He also has a medical history of  Intractable epilepsy with status epilepticus, unspecified epilepsy type, and has a vagal nerve stimulator placed on the left side of chest.  He has had previous hospitalizations at [facility], including as recently as October 2024, and he reports just getting released from a psychiatric hospital in [place] on Monday 12/9 before being brought into the [facility] system on Wednesday 12/11 with the above symptoms.  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  He lives with his girlfriend and plans to return there at discharge, states his mother will likely pick him up.  He states he has a therapist and two doctors with [facility], one of whom is a neurologist.  He asks that his mother be contacted to obtain information about names and upcoming appointments.  He states that he wants to stop smoking marijuana immediately and completely because he realizes that the way it makes him depressed and psychotic is a problem.  While here, he would benefit from crisis stabilization, peer support, milieu management, medication evaluation and administration, group therapy, psychoeducation, and discharge planning.  At discharge it is recommend that he adhere to the established aftercare plan.  Lynnell Chad. 09/06/2023

## 2023-09-06 NOTE — [facility] Group Notes (Signed)
Adult Psychoeducational Group Note  Date:  09/06/2023 Time:  8:46 PM  Group Topic/Focus:  Wrap-Up Group:   The focus of this group is to help patients review their daily goal of treatment and discuss progress on daily workbooks.  Participation Level:  Active  Participation Quality:  Appropriate  Affect:  Appropriate  Cognitive:  Appropriate  Insight: Good  Engagement in Group:  Engaged  Modes of Intervention:  Discussion  Additional Comments:  Patient day was a 6 out of 10.  Customer had a good day and want to focus on going home.  Alfonse Ras 09/06/2023, 8:46 PM

## 2023-09-06 NOTE — Plan of Care (Signed)
Received message from nurse that mom wanted to talk with a provider. Called and talked with mom Dylan Moon at [phone].  She reports patient is still paranoid, she reports she talked with patient this morning and when she asks him how he is doing he is still telling her that he needs protection when he gets out of the hospital. She reports even though he appears calm and collected if he is still telling her that he needs protection that he is not safe to come home. She reports she told this to the team at [facility] prior to when he was discharged and they still discharged him. She reports she just wanted the team to be aware of this. Discussed medication changes that were being made with the mom and expressed appreciation for her information.

## 2023-09-07 LAB — RAPID URINE DRUG SCREEN, HOSP PERFORMED
Amphetamines: NOT DETECTED
Barbiturates: NOT DETECTED
Benzodiazepines: NOT DETECTED
Cocaine: NOT DETECTED
Opiates: NOT DETECTED
Tetrahydrocannabinol: POSITIVE — AB

## 2023-09-07 NOTE — Progress Notes (Signed)
   09/07/23 0755  Psych Admission Type (Psych Patients Only)  Admission Status Involuntary  Psychosocial Assessment  Patient Complaints Anxiety  Eye Contact Fair  Facial Expression Animated  Affect Appropriate to circumstance  Speech Logical/coherent  Interaction Assertive  Motor Activity Other (Comment) (WDL)  Appearance/Hygiene Unremarkable  Behavior Characteristics Cooperative;Appropriate to situation  Mood Pleasant  Thought Process  Coherency Circumstantial  Content Preoccupation  Delusions None reported or observed  Perception WDL  Hallucination None reported or observed  Judgment Impaired  Confusion None  Danger to Self  Current suicidal ideation? Denies  Danger to Others  Danger to Others None reported or observed

## 2023-09-07 NOTE — [facility] Suicide Risk Assessment (Signed)
[facility] INPATIENT:  Family/Significant Other Suicide Prevention Education  Suicide Prevention Education:  Education Completed; girlfriend [name] [phone] AND mother Salvatore Marvel [phone],  (name of family member/significant other) has been identified by the patient as the family member/significant other with whom the patient will be residing, and identified as the person(s) who will aid the patient in the event of a mental health crisis (suicidal ideations/suicide attempt).    There are no guns available to the patient.  Mother was informed how to get him a therapist at the same location as his psychiatrist at [facility].  They were provided Mobile Crisis number and told what that service can do to support them with patient's mental health crises.  With written consent from the patient, the family member/significant other has been provided the following suicide prevention education, prior to the and/or following the discharge of the patient.  The suicide prevention education provided includes the following: Suicide risk factors Suicide prevention and interventions National Suicide Hotline telephone number [facility] assessment telephone number [facility] Emergency Assistance [facility] and/or Residential Mobile Crisis Unit telephone number  Request made of family/significant other to: Remove weapons (e.g., guns, rifles, knives), all items previously/currently identified as safety concern.   Remove drugs/medications (over-the-counter, prescriptions, illicit drugs), all items previously/currently identified as a safety concern.  The family member/significant other verbalizes understanding of the suicide prevention education information provided.  The family member/significant other agrees to remove the items of safety concern listed above.  Carloyn Jaeger Grossman-Orr 09/07/2023, 3:04 PM

## 2023-09-07 NOTE — Progress Notes (Addendum)
[facility] MD Progress Note  09/07/2023 12:50 PM Dylan Moon  MRN:  [id number]  Principal Problem: Bipolar I disorder, severe, current or most recent episode manic, with psychotic features (HCC) Diagnosis: Principal Problem:   Bipolar I disorder, severe, current or most recent episode manic, with psychotic features (HCC) Active Problems:   AKI (acute kidney injury) (HCC)   Marijuana use   Hyponatremia  Reason for Admission:  Dylan Moon is a 27 y.o., male with a past psychiatric history  of ADHD, PTSD, bipolar disorder, generalized anxiety disorder and a medical diagnosis of seizure disorder who presents to the [facility] Involuntary from [facility] Emergency Department for evaluation and management of hallucinations, aggressive behavior and paranoid delusions.   (admitted on 09/04/2023, total  LOS: 3 days )  Chart review: Overnight Events No acute events overnight. VSS. Slept 10.5 hours per morning report. Patient pleasant and cooperative on the unit.   Pertinent information discussed during bed progression:  Case was discussed in the multidisciplinary team. He reports no paranoia in the hospital.  Information Obtained Today During Patient Interview: Patient evaluated on the unit. Reports sleep was good. Reports appetite is great. States mood is "good" today. Reports recent hospitalization at [facility]. Side effects to currently prescribed medications are none. There are no somatic complaints. Reports regular bowel movements.. Continues to ask about discharge and was speaking with his girlfriend about it yesterday and this morning. 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   On interview, suicidal ideations are not present . Homicidal ideations are not present.  There are no auditory hallucinations or visual hallucinations. Denies paranoia.  Paranoia is normally associated with smoking weed.   Collateral obtained from girlfriend, Dylan Moon [phone])   Reports that patient seems like he is back to his normal self. States that at times, Dylan Moon feels paranoid that someone is out to get him and gets concerned when people pull up to the apartment complex. Reports that [place] will feel safe with him returning.   Past Psychiatric History:  Previous psych diagnoses: GAD, schizoaffective disorder, bipolar type as per chart review, ADHD.  Prior inpatient psychiatric treatment:  recently discharged from [facility] on 09/01/2023  Prior outpatient psychiatric treatment:  [facility] Current psychiatric provider:  [facility]   Neuromodulation history: denies   Current therapist: Denies Psychotherapy hx: Denies   History of suicide attempts: Denies History of homicide: Denies  Family Psychiatric History:  Medical: Mother: DM, HTN, Father: DM, MGF: Seizures Psych: Denies Psych Rx: Denies Suicide: Denies Homicide: Denies Substance use family hx: Denies  Social History:  Place of birth and grew up where: Grew up in [place], [place].  Abuse: history of emotional abuse Marital Status: single Sexual orientation: straight Children: 2 kids  Employment: self-employed doing Librarian, academic level of education: high school Housing:  living with mom  Legal: no Special educational needs teacher: never served Consulting civil engineer: denies owning any firearms Pills stockpile: denies  Past Medical History:  Past Medical History:  Diagnosis Date   ADHD    Bipolar 1 disorder (HCC)    Schizoaffective disorder (HCC)    Scoliosis    Seizures (HCC)    most recent 12/02/17   Family History:  Family History  Problem Relation Age of Onset   Diabetes Mother    Hypertension Mother    Cancer Other  Diabetes Father    Seizures Maternal Grandfather     Current Medications: Current Facility-Administered Medications  Medication Dose  Route Frequency Provider Last Rate Last Admin   acetaminophen (TYLENOL) tablet 650 mg  650 mg Oral Q6H PRN Motley-Mangrum, Jadeka A, PMHNP       alum & mag hydroxide-simeth (MAALOX/MYLANTA) 200-200-20 MG/5ML suspension 30 mL  30 mL Oral Q4H PRN Motley-Mangrum, Jadeka A, PMHNP       benztropine (COGENTIN) tablet 0.5 mg  0.5 mg Oral BID Peterson Ao, MD   0.5 mg at 09/07/23 0757   carvedilol (COREG) tablet 25 mg  25 mg Oral BID WC Massengill, Harrold Donath, MD   25 mg at 09/07/23 0756   clonazePAM (KLONOPIN) disintegrating tablet 0.25 mg  0.25 mg Oral Daily PRN Motley-Mangrum, Jadeka A, PMHNP       haloperidol (HALDOL) tablet 5 mg  5 mg Oral TID PRN Motley-Mangrum, Jadeka A, PMHNP       And   diphenhydrAMINE (BENADRYL) capsule 50 mg  50 mg Oral TID PRN Motley-Mangrum, Jadeka A, PMHNP       haloperidol lactate (HALDOL) injection 5 mg  5 mg Intramuscular TID PRN Motley-Mangrum, Jadeka A, PMHNP       And   diphenhydrAMINE (BENADRYL) injection 50 mg  50 mg Intramuscular TID PRN Motley-Mangrum, Jadeka A, PMHNP       And   LORazepam (ATIVAN) injection 2 mg  2 mg Intramuscular TID PRN Motley-Mangrum, Jadeka A, PMHNP       haloperidol lactate (HALDOL) injection 10 mg  10 mg Intramuscular TID PRN Motley-Mangrum, Jadeka A, PMHNP       And   diphenhydrAMINE (BENADRYL) injection 50 mg  50 mg Intramuscular TID PRN Motley-Mangrum, Jadeka A, PMHNP       And   LORazepam (ATIVAN) injection 2 mg  2 mg Intramuscular TID PRN Motley-Mangrum, Jadeka A, PMHNP       divalproex (DEPAKOTE) DR tablet 750 mg  750 mg Oral Q12H Peterson Ao, MD   750 mg at 09/07/23 0757   escitalopram (LEXAPRO) tablet 10 mg  10 mg Oral Daily Motley-Mangrum, Jadeka A, PMHNP   10 mg at 09/07/23 0757   Eslicarbazepine Acetate TABS 1,600 mg  1,600 mg Oral Daily Motley-Mangrum, Jadeka A, PMHNP   1,600 mg at 09/07/23 0756   hydrOXYzine (ATARAX) tablet 25 mg  25 mg Oral TID PRN Motley-Mangrum, Jadeka A, PMHNP       magnesium hydroxide (MILK OF  MAGNESIA) suspension 30 mL  30 mL Oral Daily PRN Motley-Mangrum, Jadeka A, PMHNP       [START ON 10/02/2023] paliperidone (INVEGA SUSTENNA) injection 156 mg  156 mg Intramuscular Q28 days Massengill, Harrold Donath, MD       paliperidone (INVEGA) 24 hr tablet 3 mg  3 mg Oral Daily Peterson Ao, MD   3 mg at 09/07/23 0757   traZODone (DESYREL) tablet 50 mg  50 mg Oral QHS PRN Motley-Mangrum, Jadeka A, PMHNP   50 mg at 09/06/23 2024   zonisamide (ZONEGRAN) capsule 300 mg  300 mg Oral Daily Motley-Mangrum, Jadeka A, PMHNP   300 mg at 09/07/23 1610    Lab Results:  Results for orders placed or performed during the hospital encounter of 09/04/23 (from the past 48 hours)  Rapid urine drug screen (hospital performed)     Status: Abnormal   Collection Time: 09/05/23  9:21 PM  Result Value Ref Range   Opiates NONE DETECTED NONE DETECTED   Cocaine NONE DETECTED NONE DETECTED  Benzodiazepines NONE DETECTED NONE DETECTED   Amphetamines NONE DETECTED NONE DETECTED   Tetrahydrocannabinol POSITIVE (A) NONE DETECTED   Barbiturates NONE DETECTED NONE DETECTED    Comment: (NOTE) DRUG SCREEN FOR MEDICAL PURPOSES ONLY.  IF CONFIRMATION IS NEEDED FOR ANY PURPOSE, NOTIFY LAB WITHIN 5 DAYS.  LOWEST DETECTABLE LIMITS FOR URINE DRUG SCREEN Drug Class                     Cutoff (ng/mL) Amphetamine and metabolites    1000 Barbiturate and metabolites    200 Benzodiazepine                 200 Opiates and metabolites        300 Cocaine and metabolites        300 THC                            50 Performed at [facility], [address], [place], [place] [address]   Basic metabolic panel     Status: Abnormal   Collection Time: 09/06/23  6:50 PM  Result Value Ref Range   Sodium 134 (L) 135 - 145 mmol/L   Potassium 3.7 3.5 - 5.1 mmol/L   Chloride 101 98 - 111 mmol/L   CO2 24 22 - 32 mmol/L   Glucose, Bld 120 (H) 70 - 99 mg/dL    Comment: Glucose reference range applies only to samples taken  after fasting for at least 8 hours.   BUN 13 6 - 20 mg/dL   Creatinine, Ser 1.91 0.61 - 1.24 mg/dL   Calcium 9.0 8.9 - 47.8 mg/dL   GFR, Estimated >29 >56 mL/min    Comment: (NOTE) Calculated using the CKD-EPI Creatinine Equation (2021)    Anion gap 9 5 - 15    Comment: Performed at [facility], [address] [place], [place] [address]    Blood Alcohol level:  Lab Results  Component Value Date   [facility] <10 09/03/2023   ETH <10 07/14/2023    Metabolic Labs: Lab Results  Component Value Date   HGBA1C 5.6 07/17/2023   MPG 114.02 07/17/2023   MPG 111.15 01/30/2023   Lab Results  Component Value Date   PROLACTIN 40.3 (H) 12/06/2022   Lab Results  Component Value Date   CHOL 199 07/17/2023   TRIG 193 (H) 07/17/2023   HDL 59 07/17/2023   CHOLHDL 3.4 07/17/2023   VLDL 39 07/17/2023   LDLCALC 101 (H) 07/17/2023   LDLCALC 91 01/30/2023    Sleep:Sleep: Good Number of Hours of Sleep: 10.5    Physical Findings: AIMS: Facial and Oral Movements Muscles of Facial Expression: None, normal Lips and Perioral Area: None, normal Jaw: None, normal Tongue: None, normal,Extremity Movements Upper (arms, wrists, hands, fingers): None, normal Lower (legs, knees, ankles, toes): None, normal Trunk Movements: None, normal  Neck, shoulders, hips: None, normal Overall Severity Severity of abnormal movements (highest score from questions above): None, normal Incapacitation due to abnormal movements: None, normal Patient's awareness of abnormal movements (rate only patient's report): No Awareness, Dental Status Current problems with teeth and/or dentures?: No Does patient usually wear dentures?: No   No stiffness, cogwheeling, or tremors noted on exam.   CIWA:    COWS:     Psychiatric Specialty Exam:  Presentation  General Appearance: Appropriate for Environment; Casual  Eye Contact:Fair  Speech:Normal Rate  Speech  Volume:Normal  Handedness:Right  Mood and Affect  Mood: "Good"   Affect:Congruent   Thought Process  Thought Processes:Coherent  Descriptions of Associations:Intact  Orientation:Full (Time, Place and Person)  Thought Content:Logical  History of Schizophrenia/Schizoaffective disorder:Yes  Duration of Psychotic Symptoms:Greater than six months  Hallucinations:Hallucinations: None   Ideas of Reference:None  Suicidal Thoughts:Suicidal Thoughts: No   Homicidal Thoughts:Homicidal Thoughts: No    Sensorium  Memory:Immediate Good  Judgment:Fair  Insight:Fair   Executive Functions  Concentration:Fair  Attention Span:Fair  Recall:Fair  Fund of Knowledge:Fair  Language:Good   Psychomotor Activity  Psychomotor Activity:Psychomotor Activity: Normal    Assets  Assets:Social Support; Housing; Intimacy; Talents/Skills   Sleep  Sleep:Sleep: Good Number of Hours of Sleep: 10.5    Physical Exam Constitutional:      Appearance: Normal appearance. He is not ill-appearing or toxic-appearing.  Pulmonary:     Effort: Pulmonary effort is normal.  Musculoskeletal:        General: Normal range of motion.  Neurological:     Mental Status: He is alert and oriented to person, place, and time.  Psychiatric:        Attention and Perception: He does not perceive auditory or visual hallucinations.        Mood and Affect: Mood is not anxious or depressed.        Speech: Speech normal.        Behavior: Behavior is not agitated, aggressive, withdrawn or combative. Behavior is cooperative.        Thought Content: Thought content is not paranoid or delusional. Thought content does not include homicidal or suicidal ideation. Thought content does not include homicidal or suicidal plan.        Judgment: Judgment is not impulsive.    Review of Systems  Constitutional:  Negative for chills and fever.  Respiratory:  Negative for cough.   Cardiovascular:  Negative for  chest pain and palpitations.  Gastrointestinal:  Negative for constipation, diarrhea, nausea and vomiting.  Neurological:  Negative for weakness and headaches.  Psychiatric/Behavioral:  Negative for depression, hallucinations and suicidal ideas. The patient is not nervous/anxious and does not have insomnia.     Blood pressure 123/75, pulse 81, temperature 98.2 F (36.8 C), temperature source Oral, resp. rate 14, height 5\' 11"  (1.803 m), weight 94.8 kg, SpO2 100%. Body mass index is 29.15 kg/m.  Treatment Plan Summary: Daily contact with patient to assess and evaluate symptoms and progress in treatment, Medication management, and Plan    ASSESSMENT: Dylan Moon is a 27 y.o., male with a past psychiatric history  of ADHD, PTSD, bipolar disorder, generalized anxiety disorder and a medical diagnosis of seizure disorder who presents to the [facility] Involuntary from [facility] Emergency Department for evaluation and management of hallucinations, aggressive behavior and paranoid delusions. On assessment, patient is calm and cooperative. He denies SI/HIAVH. The patient will be admitted for medication management and symptom stabilization.   Diagnoses / Active Problems: Principal Problem:   Bipolar I disorder, severe, current or most recent episode manic, with psychotic features (HCC) Active Problems:   AKI (acute kidney injury) (HCC)   Marijuana use   Hyponatremia   PLAN: Safety and Monitoring:  --  INVOLUNTARY admission to inpatient psychiatric unit for safety, stabilization and treatment  -- Daily contact with patient to assess and evaluate symptoms and progress in treatment  -- Patient's case to be discussed in multi-disciplinary team meeting  -- Observation Level : q15 minute checks  -- Vital signs:  q12 hours  --  Precautions: suicide, elopement, and assault  2. Psychiatric Diagnoses and Treatment:    -- Patient received 156 mg IM dose of Invega on 12/12  ,additional dose of 117 given 12/13, given ongoing paranoia   -- Will need monthly Invega IM 234 10/03/22             -- Continue Home Invega to 3 mg daily for paranoia and discontinue 2 weeks after discharge            -- Continue Cogentin 0.5 mg BID for prophylaxis of EPS             --Continue increased Depakote dose to 750 mg BID for mood stabilization            -- Obtain labs on 12/17:                        - VPA                        - CMP                        - Ammonia levels  Tobacco Use  Marijuana Use             -- Patient in need of nicotine replacement; nicotine polacrilex (gum) and nicotine patch 14 mg / 24 hours ordered. Smoking cessation encouraged            -- Encourage marijuana cessation  -- The risks/benefits/side-effects/alternatives to this medication were discussed in detail with the patient and time was given for questions. The patient consents to medication trial.              -- Metabolic profile and EKG monitoring obtained while on an atypical antipsychotic  BMI: 29.15 TSH:  Lab Results  Component Value Date   TSH 2.536 07/18/2023   Lipid Panel:  Lab Results  Component Value Date   CHOL 199 07/17/2023   HDL 59 07/17/2023   LDLCALC 101 (H) 07/17/2023   TRIG 193 (H) 07/17/2023   CHOLHDL 3.4 07/17/2023    HbgA1c:  Lab Results  Component Value Date   HGBA1C 5.6 07/17/2023     Drugs of Abuse     Component Value Date/Time   LABOPIA NONE DETECTED 09/05/2023 2121   COCAINSCRNUR NONE DETECTED 09/05/2023 2121   LABBENZ NONE DETECTED 09/05/2023 2121   AMPHETMU NONE DETECTED 09/05/2023 2121   THCU POSITIVE (A) 09/05/2023 2121   LABBARB NONE DETECTED 09/05/2023 2121    EKG: QTc: 407             -- Encouraged patient to participate in unit milieu and in scheduled group therapies   -- Short Term Goals: Ability to identify changes in lifestyle to reduce recurrence of condition will improve, Ability to verbalize feelings will improve, Ability to disclose  and discuss suicidal ideas, Ability to demonstrate self-control will improve, Ability to identify and develop effective coping behaviors will improve, Ability to maintain clinical measurements within normal limits will improve, Compliance with prescribed medications will improve, and Ability to identify triggers associated with substance abuse/mental health issues will improve  -- Long Term Goals: Improvement in symptoms so as ready for discharge  Other PRNS:  -- start acetaminophen 650 mg every 6 hours as needed for mild to moderate pain, fever, and headaches              -- start  hydroxyzine 25 mg three times a day as needed for anxiety              -- start ondansetron 8 mg every 8 hours as needed for nausea or vomiting              -- start aluminum-magnesium hydroxide + simethicone 30 mL every 4 hours as needed for heartburn              -- start trazodone 50 mg at bedtime as needed for insomnia            -- Klonipin 0.25 mg daily prn             -- As needed agitation protocol in-place  -- As needed agitation protocol in-place   3. Medical Issues Being Addressed:   Seizures: Continue home medications             -- Eslicarbazepine acetate 1600 mg daily             -- Zonisamide 300 mg daily  AKI(resolved) : Patient with elevated creatinine of 1.32 >> Repeat BMP 12/14 1.10             Encourage PO hydration             Avoid nephrotoxic agents  Hyponatremia: Patient with mild hyponatremia, suspect secondary to decrease PO intake vs dehydration             - Repeat BMP 134             - Encourage PO intake   4. Discharge Planning:   -- Social work and case management to assist with discharge planning and identification of hospital follow-up needs prior to discharge  -- Estimated LOS: 5-7 days  -- Discharge Concerns: Need to establish a safety plan; Medication compliance and effectiveness  -- Discharge Goals: Return home with outpatient referrals for mental health follow-up including  medication management/psychotherapy   Please see attending attestation for additional details and finalized treatment plan.    Peterson Ao, MD PGY-1, Psychiatry Residency  12/15/202412:50 PM

## 2023-09-07 NOTE — [facility] Group Notes (Signed)
Adult Psychoeducational Group Note  Date:  09/07/2023 Time:  8:53 PM  Group Topic/Focus:  Wrap-Up Group:   The focus of this group is to help patients review their daily goal of treatment and discuss progress on daily workbooks.  Participation Level:  Active  Participation Quality:  Appropriate  Affect:  Flat  Cognitive:  Appropriate  Insight: Appropriate  Engagement in Group:  Engaged  Modes of Intervention:  Discussion  Additional Comments:   Pt states that he had a good day and was able to attend groups and go to the gym with his peers. Pt had a visit from his partner and called his mother today. Pt denies everything  Vevelyn Pat 09/07/2023, 8:53 PM

## 2023-09-07 NOTE — Progress Notes (Signed)
   09/07/23 0557  15 Minute Checks  Location Bedroom  Visual Appearance Calm  Behavior Sleeping  Sleep (Behavioral Health Patients Only)  Calculate sleep? (Click Yes once per 24 hr at 0600 safety check) Yes  Documented sleep last 24 hours 10.5

## 2023-09-07 NOTE — Plan of Care (Signed)
°  Problem: Education: °Goal: Emotional status will improve °Outcome: Progressing °Goal: Mental status will improve °Outcome: Progressing °Goal: Verbalization of understanding the information provided will improve °Outcome: Progressing °  °

## 2023-09-08 DIAGNOSIS — F312 Bipolar disorder, current episode manic severe with psychotic features: Secondary | ICD-10-CM | POA: Diagnosis not present

## 2023-09-08 NOTE — Plan of Care (Signed)

## 2023-09-08 NOTE — Plan of Care (Signed)
  Problem: Education: Goal: Knowledge of [facility] Education information/materials will improve Outcome: Progressing Goal: Emotional status will improve Outcome: Progressing Goal: Mental status will improve Outcome: Progressing Goal: Verbalization of understanding the information provided will improve Outcome: Progressing   Problem: Activity: Goal: Interest or engagement in activities will improve Outcome: Progressing   Problem: Coping: Goal: Ability to demonstrate self-control will improve Outcome: Progressing   Problem: Safety: Goal: Periods of time without injury will increase Outcome: Progressing

## 2023-09-08 NOTE — Group Note (Signed)
Recreation Therapy Group Note   Group Topic:Coping Skills  Group Date: 09/08/2023 Start Time: 1001 End Time: 1040 Facilitators: Willaim Mode-McCall, LRT,CTRS Location: [address] Dayroom   Group Topic: Stress Management   Goal Area(s) Addresses:  Patient will identify positive coping techniques. Patient will identify benefits of using positive copings skills post d/c.   Intervention: Worksheet, Group brain storming   Group Description:  Mind Map.  Patient was provided a blank template of a diagram with 32 blank boxes in a tiered system, branching from the center (similar to a bubble chart). LRT directed patients to label the middle of the diagram "Coping Skills" and consider 8 different instances (anger, stress, depression, fear, anxiety, lack of control, finances and time management) in which coping skills would be needed, that would be placed in the 2nd tier boxes. Patients were to then come up with 3 effective coping skills for the instances identified in 2nd tier of boxes. LRT then wrote the responses on the board for patients to fill in any blank spaces on their sheets.   Education: Stress Management, Discharge Planning.    Education Outcome: Acknowledges Education   Affect/Mood: Appropriate   Participation Level: Active   Participation Quality: Independent   Behavior: Appropriate   Speech/Thought Process: Relevant   Insight: Fair   Judgement: Fair    Modes of Intervention: Worksheet   Patient Response to Interventions:  Receptive   Education Outcome:  In group clarification offered    Clinical Observations/Individualized Feedback: Pt was bright and interactive during activity. Pt struggled to come up with coping skills and some of his responses didn't make sense. Pt was able to identify some positive coping skills as games, savings, deep breathing, make a schedule and music. Some of the questionable "coping skills" pt stated were people, fear and thinking. Pt  was engaged during group session.     Plan: Continue to engage patient in RT group sessions 2-3x/week.   Huan Pollok-McCall, LRT,CTRS 09/08/2023 2:03 PM

## 2023-09-08 NOTE — Progress Notes (Signed)
Pt has ACTT services with [facility] [phone], scheduled 09/10/23 at 12p to be seen in office. [name] mother aware.

## 2023-09-08 NOTE — Group Note (Signed)
LCSW Group Therapy Note   Group Date: 09/08/2023 Start Time: 1315 End Time: 1405   Type of Therapy and Topic:  Group Therapy - Who Am I?  Participation Level:  Active   Description of Group The focus of this group was to aid patients in self-exploration and awareness. Patients were guided in exploring various factors of oneself to include interests, readiness to change, management of emotions, and individual perception of self. Patients were provided with complementary worksheets exploring hidden talents, ease of asking other for help, music/media preferences, understanding and responding to feelings/emotions, and hope for the future. At group closing, patients were encouraged to adhere to discharge plan to assist in continued self-exploration and understanding.  Therapeutic Goals Patients learned that self-exploration and awareness is an ongoing process Patients identified their individual skills, preferences, and abilities Patients explored their openness to establish and confide in supports Patients explored their readiness for change and progression of mental health   Summary of Patient Progress:  Patient actively engaged in introductory check-in. Patient actively engaged in activity of self-exploration and identification, completing complementary worksheet to assist in discussion. Patient identified various factors ranging from hidden talents, favorite music and movies, trusted individuals, accountability, and individual perceptions of self and hope.Pt engaged in processing thoughts and feelings as well as means of reframing thoughts. Pt proved receptive of alternate group members input and feedback from CSW.  BestCandace Cruise, LCSWA 09/08/2023  2:12 PM

## 2023-09-08 NOTE — Progress Notes (Signed)
   09/08/23 0900  Psych Admission Type (Psych Patients Only)  Admission Status Involuntary  Psychosocial Assessment  Patient Complaints Anxiety  Eye Contact Fair  Facial Expression Animated  Affect Appropriate to circumstance  Speech Logical/coherent  Interaction Assertive  Motor Activity Other (Comment) (WNL)  Appearance/Hygiene Unremarkable  Behavior Characteristics Cooperative;Appropriate to situation  Mood Pleasant  Thought Process  Coherency Circumstantial  Content WDL  Delusions None reported or observed  Perception WDL  Hallucination None reported or observed  Judgment Poor  Confusion None  Danger to Self  Current suicidal ideation? Denies  Agreement Not to Harm Self Yes  Description of Agreement verbal  Danger to Others  Danger to Others None reported or observed

## 2023-09-08 NOTE — [facility] Group Notes (Signed)
Adult Psychoeducational Group Note  Date:  09/08/2023 Time:  8:22 PM  Group Topic/Focus:  Wrap-Up Group:   The focus of this group is to help patients review their daily goal of treatment and discuss progress on daily workbooks.  Participation Level:  Active  Participation Quality:  Appropriate  Affect:  Appropriate  Cognitive:  Appropriate  Insight: Appropriate  Engagement in Group:  Engaged  Modes of Intervention:  Discussion  Additional Comments:   Pt states that he's had a great day and is hopeful for his d/c tomorrow. Pt denied everything  Vevelyn Pat 09/08/2023, 8:22 PM
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
## 2023-09-08 NOTE — Progress Notes (Signed)
   09/07/23 2300  Psych Admission Type (Psych Patients Only)  Admission Status Involuntary  Psychosocial Assessment  Patient Complaints Anxiety  Eye Contact Fair  Facial Expression Animated  Affect Appropriate to circumstance  Speech Logical/coherent  Interaction Assertive  Motor Activity Slow  Appearance/Hygiene Unremarkable  Behavior Characteristics Cooperative;Appropriate to situation  Mood Pleasant  Thought Process  Coherency Circumstantial  Content WDL  Delusions None reported or observed  Perception WDL  Hallucination None reported or observed  Judgment Poor  Confusion None  Danger to Self  Current suicidal ideation? Denies  Agreement Not to Harm Self Yes  Description of Agreement verbal  Danger to Others  Danger to Others None reported or observed

## 2023-09-09 LAB — COMPREHENSIVE METABOLIC PANEL
ALT: 16 U/L (ref 0–44)
AST: 18 U/L (ref 15–41)
Albumin: 4.1 g/dL (ref 3.5–5.0)
Alkaline Phosphatase: 39 U/L (ref 38–126)
Anion gap: 8 (ref 5–15)
BUN: 14 mg/dL (ref 6–20)
CO2: 25 mmol/L (ref 22–32)
Calcium: 9.1 mg/dL (ref 8.9–10.3)
Chloride: 102 mmol/L (ref 98–111)
Creatinine, Ser: 1.01 mg/dL (ref 0.61–1.24)
GFR, Estimated: >60 mL/min (ref >60)
Glucose, Bld: 110 mg/dL — ABNORMAL HIGH (ref 70–99)
Potassium: 4 mmol/L (ref 3.5–5.1)
Sodium: 135 mmol/L (ref 135–145)
Total Bilirubin: 0.5 mg/dL (ref <1.2)
Total Protein: 7.1 g/dL (ref 6.5–8.1)

## 2023-09-09 LAB — VALPROIC ACID LEVEL: Valproic Acid Lvl: 93 ug/mL (ref 50.0–100.0)

## 2023-09-09 LAB — AMMONIA: Ammonia: 61 umol/L — ABNORMAL HIGH (ref 9–35)

## 2023-09-09 MED ORDER — PALIPERIDONE ER 3 MG PO TB24: 3.0000 mg | ORAL_TABLET | Freq: Every day | ORAL | 0 refills | Status: DC

## 2023-09-09 MED ORDER — BENZTROPINE MESYLATE 0.5 MG PO TABS: 0.5000 mg | ORAL_TABLET | Freq: Two times a day (BID) | ORAL | 0 refills | Status: DC

## 2023-09-09 MED ORDER — HYDROXYZINE HCL 25 MG PO TABS: 25.0000 mg | ORAL_TABLET | Freq: Three times a day (TID) | ORAL | 0 refills | Status: AC | PRN

## 2023-09-09 MED ORDER — PALIPERIDONE PALMITATE ER 234 MG/1.5ML IM SUSY: 234.0000 mg | PREFILLED_SYRINGE | INTRAMUSCULAR | 0 refills | Status: DC

## 2023-09-09 MED ORDER — DIVALPROEX SODIUM 250 MG PO DR TAB: 750.0000 mg | DELAYED_RELEASE_TABLET | Freq: Two times a day (BID) | ORAL | 0 refills | Status: DC

## 2023-09-09 MED ORDER — PALIPERIDONE PALMITATE ER 234 MG/1.5ML IM SUSY: 234.0000 mg | PREFILLED_SYRINGE | INTRAMUSCULAR | Status: DC

## 2023-09-09 MED ORDER — TRAZODONE HCL 50 MG PO TABS: 50.0000 mg | ORAL_TABLET | Freq: Every evening | ORAL | 1 refills | Status: DC | PRN

## 2023-09-09 MED ORDER — NICOTINE POLACRILEX 2 MG MT GUM: 2.0000 mg | CHEWING_GUM | OROMUCOSAL | Status: DC | PRN

## 2023-09-09 MED ORDER — NICOTINE POLACRILEX 2 MG MT GUM: 2.0000 mg | CHEWING_GUM | OROMUCOSAL | 0 refills | Status: DC | PRN

## 2023-09-09 MED ORDER — PALIPERIDONE PALMITATE ER 234 MG/1.5ML IM SUSY
234.0000 mg | PREFILLED_SYRINGE | INTRAMUSCULAR | Status: DC
Start: 2023-10-02 — End: 2023-09-09

## 2023-09-09 NOTE — Progress Notes (Signed)
   09/08/23 2017  Psych Admission Type (Psych Patients Only)  Admission Status Involuntary  Psychosocial Assessment  Patient Complaints None  Eye Contact Fair  Facial Expression Animated  Affect Appropriate to circumstance  Speech Logical/coherent  Interaction Assertive  Motor Activity Other (Comment) (WNL)  Appearance/Hygiene Unremarkable  Behavior Characteristics Cooperative  Mood Pleasant  Thought Process  Coherency Circumstantial  Content WDL  Delusions None reported or observed  Perception WDL  Hallucination None reported or observed  Judgment Impaired  Confusion None  Danger to Self  Current suicidal ideation? Denies  Agreement Not to Harm Self Yes  Description of Agreement verbal  Danger to Others  Danger to Others None reported or observed

## 2023-09-09 NOTE — Plan of Care (Signed)
°  Problem: Education: °Goal: Emotional status will improve °Outcome: Progressing °Goal: Mental status will improve °Outcome: Progressing °  °Problem: Activity: °Goal: Interest or engagement in activities will improve °Outcome: Progressing °  °

## 2023-09-09 NOTE — Progress Notes (Signed)
   09/09/23 0559  15 Minute Checks  Location Hallway  Visual Appearance Calm  Behavior Composed  Sleep (Behavioral Health Patients Only)  Calculate sleep? (Click Yes once per 24 hr at 0600 safety check) Yes  Documented sleep last 24 hours 8.75

## 2023-09-09 NOTE — Discharge Summary (Signed)
Physician Discharge Summary Note  Patient:  Dylan Moon is an 27 y.o., male MRN:  [id number] DOB:  1995/12/09 Patient phone:  [phone] (home)  Patient address:   [address] [place] [address],  Total Time spent with patient: 30 minutes  Date of Admission:  09/04/2023 Date of Discharge: 09/09/2023  Reason for Admission:    Dylan Moon is a 27 y.o., male with a past psychiatric history  of ADHD, PTSD, bipolar disorder, generalized anxiety disorder and a medical diagnosis of seizure disorder who presents to the [facility] Involuntary from [facility] Emergency Department for evaluation and management of hallucinations, aggressive behavior and paranoid delusions.   Principal Problem: Bipolar I disorder, severe, current or most recent episode manic, with psychotic features [facility]) Discharge Diagnoses: Principal Problem:   Bipolar I disorder, severe, current or most recent episode manic, with psychotic features (HCC) Active Problems:   Marijuana use    Past Psychiatric History:  Previous psych diagnoses: GAD, schizoaffective disorder, bipolar type as per chart review, ADHD.  Prior inpatient psychiatric treatment:  recently discharged from [facility] on 09/01/2023  Prior outpatient psychiatric treatment:  [facility] Current psychiatric provider:  [facility]   Neuromodulation history: denies   Current therapist: Denies Psychotherapy hx: Denies   History of suicide attempts: Denies History of homicide: Denies     Past Medical History:  Past Medical History:  Diagnosis Date   ADHD    Bipolar 1 disorder (HCC)    Schizoaffective disorder (HCC)    Scoliosis    Seizures (HCC)    most recent 12/02/17    Past Surgical History:  Procedure Laterality Date   NO PAST SURGERIES     Family History:  Family History  Problem Relation Age of Onset   Diabetes Mother    Hypertension Mother    Cancer Other    Diabetes Father     Seizures Maternal Grandfather    Family Psychiatric  History:  Psych: Denies Psych Rx: Denies Suicide: Denies Homicide: Denies Substance use family hx: Denies  Social History:  Social History   Substance and Sexual Activity  Alcohol Use Not Currently     Social History   Substance and Sexual Activity  Drug Use Yes   Types: Marijuana   Comment: Daily use of marijuana.    Social History   Socioeconomic History   Marital status: Single    Spouse name: Not on file   Number of children: 0   Years of education: HS   Highest education level: Not on file  Occupational History   Occupation: Product manager of videos  Tobacco Use   Smoking status: Some Days    Types: Cigars   Smokeless tobacco: Never  Vaping Use   Vaping status: Never Used  Substance and Sexual Activity   Alcohol use: Not Currently   Drug use: Yes    Types: Marijuana    Comment: Daily use of marijuana.   Sexual activity: Not Currently  Other Topics Concern   Not on file  Social History Narrative   Lives at home his mother.   3-4 sodas per week.   Right-handed.   Social Drivers of Corporate investment banker Strain: Not on file  Food Insecurity: Patient Declined (09/04/2023)   Hunger Vital Sign    Worried About Running Out of Food in the Last Year: Patient declined    Ran Out of Food in the Last Year: Patient declined  Recent Concern:  Food Insecurity - Food Insecurity Present (07/16/2023)   Hunger Vital Sign    Worried About Running Out of Food in the Last Year: Often true    Ran Out of Food in the Last Year: Sometimes true  Transportation Needs: Patient Declined (09/04/2023)   PRAPARE - Administrator, Civil Service (Medical): Patient declined    Lack of Transportation (Non-Medical): Patient declined  Physical Activity: Not on file  Stress: Not on file  Social Connections: Not on file    Hospital Course:  During the patient's hospitalization, patient had extensive  initial psychiatric evaluation, and follow-up psychiatric evaluations every day.   Psychiatric diagnoses provided upon initial assessment:  Bipolar I disorder, severe, current or most recent episode manic, with psychotic features (HCC)  Principal Problem:   Bipolar I disorder, severe, current or most recent episode manic, with psychotic features (HCC) Active Problems:   AKI (acute kidney injury) (HCC)   Marijuana use   Hyponatremia   Patient's psychiatric medications were adjusted on admission:  - Received additional dose of 117 mg IM on admission, due to ongoing paranoia, he received 156 mg IM dose of Invega on 12/12 in the ED prior to admission  - Decreased Home Invega to 3 mg daily - Started Cogentin 0.5 mg twice daily for prophylaxis against abnormal movements/rigidity, discuss discontinuing with outpatient provider  - Increased Depakote dose to 750 mg twice daily  - Started hydroxyzine 25 mg three times daily as needed for anxiety  - Started trazodone 50 mg at bedtime as needed for insomnia    During the hospitalization, other adjustments were made to the patient's psychiatric medication regimen:  - Start Invega 234 mg IM on 10/01/2022, discuss with outpatient providers [facility] and schedule appointment with shot clinic  - Continue taking Invega 3 mg daily for 2 weeks then discontinue - Discontinued klonipin 0.25 mg twice daily as needed, patient never requested while inpatient    Patient's care was discussed during the interdisciplinary team meeting every day during the hospitalization.   The patient denied having side effects to prescribed psychiatric medication.   Gradually, patient started adjusting to milieu. The patient was evaluated each day by a clinical provider to ascertain response to treatment. Improvement was noted by the patient's report of decreasing symptoms, improved sleep and appetite, affect, medication tolerance, behavior, and participation in unit  programming.  Patient was asked each day to complete a self inventory noting mood, mental status, pain, new symptoms, anxiety and concerns.     Symptoms were reported as significantly decreased or resolved completely by discharge.    On day of discharge, the patient reports that their mood is stable. The patient denied having suicidal thoughts for more than 48 hours prior to discharge.  Patient denies having homicidal thoughts.  Patient denies having auditory hallucinations.  Patient denies any visual hallucinations or other symptoms of psychosis. The patient was motivated to continue taking medication with a goal of continued improvement in mental health.    The patient reports their target psychiatric symptoms of paranoia, aggression and hallucinations responded well to the psychiatric medications, and the patient reports overall benefit other psychiatric hospitalization. Supportive psychotherapy was provided to the patient. The patient also participated in regular group therapy while hospitalized. Coping skills, problem solving as well as relaxation therapies were also part of the unit programming.   Labs were reviewed with the patient, and abnormal results were discussed with the patient.   The patient is able to  verbalize their individual safety plan to this provider.   # It is recommended to the patient to continue psychiatric medications as prescribed, after discharge from the hospital.     # It is recommended to the patient to follow up with your outpatient psychiatric provider and PCP.   # It was discussed with the patient, the impact of alcohol, drugs, tobacco have been there overall psychiatric and medical wellbeing, and total abstinence from substance use was recommended the patient.ed.   # Prescriptions provided or sent directly to preferred pharmacy at discharge. Patient agreeable to plan. Given opportunity to ask questions. Appears to feel comfortable with discharge.    # In the  event of worsening symptoms, the patient is instructed to call the crisis hotline, 911 and or go to the nearest ED for appropriate evaluation and treatment of symptoms. To follow-up with primary care provider for other medical issues, concerns and or health care needs   # Patient was discharged home with his mother with a plan to follow up as noted below.      Physical Findings: AIMS:  , ,  ,  ,   AIMS score zero. No EPS noted on exam.  CIWA:   0 COWS:   0  Musculoskeletal: Strength & Muscle Tone: within normal limits Gait & Station: normal Patient leans: N/A   Psychiatric Specialty Exam   Presentation  General Appearance: Appropriate for Environment; Casual   Eye Contact:Good   Speech:Normal Rate   Speech Volume:Normal   Handedness:Right     Mood and Affect  Mood:Euthymic   Duration of Depression Symptoms: None Affect:Congruent     Thought Process  Thought Processes:Coherent   Descriptions of Associations:Intact   Orientation:Full (Time, Place and Person)   Thought Content:Logical   History of Schizophrenia/Schizoaffective disorder:No   Duration of Psychotic Symptoms:N/A   Hallucinations:Hallucinations: None   Ideas of Reference:None   Suicidal Thoughts:Suicidal Thoughts: No   Homicidal Thoughts:Homicidal Thoughts: No     Sensorium  Memory:Immediate Good; Recent Good   Judgment:Intact   Insight:Present     Executive Functions  Concentration:Good   Attention Span:Good   Recall:Good   Fund of Knowledge:Good   Language:Good     Psychomotor Activity  Psychomotor Activity:Psychomotor Activity: Normal     Assets  Assets:Desire for Improvement; Talents/Skills; Social Support; Housing; Intimacy     Sleep  Sleep:Sleep: Good     Physical Exam: Physical Exam Constitutional:      Appearance: Normal appearance.  Pulmonary:     Effort: Pulmonary effort is normal.  Neurological:     Mental Status: He is alert and oriented to person,  place, and time.  Psychiatric:        Attention and Perception: He does not perceive auditory or visual hallucinations.        Mood and Affect: Mood is not anxious or depressed.        Speech: Speech normal.        Behavior: Behavior is not aggressive or combative. Behavior is cooperative.        Thought Content: Thought content is not paranoid or delusional. Thought content does not include homicidal or suicidal ideation.      Review of Systems  Constitutional:  Negative for chills and fever.  Respiratory:  Negative for cough.   Cardiovascular:  Negative for chest pain.  Gastrointestinal:  Negative for constipation, diarrhea, nausea and vomiting.  Musculoskeletal:  Negative for myalgias.  Neurological:  Negative for tingling, weakness and headaches.  Psychiatric/Behavioral:  Negative for depression, hallucinations and suicidal ideas. The patient is not nervous/anxious and does not have insomnia.    Blood pressure 125/72, pulse (!) 58, temperature 98.3 F (36.8 C), temperature source Oral, resp. rate 18, height 5\' 11"  (1.803 m), weight 94.8 kg, SpO2 100%. Body mass index is 29.15 kg/m.   Social History   Tobacco Use  Smoking Status Some Days   Types: Cigars  Smokeless Tobacco Never   Tobacco Cessation:  A prescription for an FDA-approved tobacco cessation medication provided at discharge   Blood Alcohol level:  Lab Results  Component Value Date   [facility] <10 09/03/2023   ETH <10 07/14/2023    Metabolic Disorder Labs:  Lab Results  Component Value Date   HGBA1C 5.6 07/17/2023   MPG 114.02 07/17/2023   MPG 111.15 01/30/2023   Lab Results  Component Value Date   PROLACTIN 40.3 (H) 12/06/2022   Lab Results  Component Value Date   CHOL 199 07/17/2023   TRIG 193 (H) 07/17/2023   HDL 59 07/17/2023   CHOLHDL 3.4 07/17/2023   VLDL 39 07/17/2023   LDLCALC 101 (H) 07/17/2023   LDLCALC 91 01/30/2023    See Psychiatric Specialty Exam and Suicide Risk Assessment completed  by Attending Physician prior to discharge.  Discharge destination:  Home  Is patient on multiple antipsychotic therapies at discharge:  Yes,   Do you recommend tapering to monotherapy for antipsychotics?  Yes  , Taper off Invega 3 mg daily for 2 weeks and discontinue, with plan to start Invega 234 mg IM injection on 10/01/2022 Has Patient had three or more failed trials of antipsychotic monotherapy by history:  No  Recommended Plan for Multiple Antipsychotic Therapies: None    Allergies as of 09/09/2023       Reactions   Keppra [levetiracetam] Other (See Comments)   Irritability, mental status changes   Tramadol Other (See Comments)   Mixed with his other meds can cause seizures    Vimpat [lacosamide] Other (See Comments)   Causes anger, mental status changes        Medication List     STOP taking these medications    acetaminophen-codeine 300-30 MG tablet Commonly known as: TYLENOL #3   amoxicillin 500 MG capsule Commonly known as: AMOXIL   chlorhexidine 0.12 % solution Commonly known as: PERIDEX   clonazePAM 0.25 MG disintegrating tablet Commonly known as: KLONOPIN   ibuprofen 800 MG tablet Commonly known as: ADVIL       TAKE these medications      Indication  Aptiom 800 MG Tabs Generic drug: Eslicarbazepine Acetate Take 1,600 mg by mouth daily.  Indication: Focal Epilepsy   benztropine 0.5 MG tablet Commonly known as: COGENTIN Take 1 tablet (0.5 mg total) by mouth 2 (two) times daily.  Indication: Extrapyramidal Reaction caused by Medications   carvedilol 25 MG tablet Commonly known as: COREG Take 1 tablet (25 mg total) by mouth 2 (two) times daily with a meal.  Indication: High Blood Pressure   divalproex 250 MG DR tablet Commonly known as: DEPAKOTE Take 3 tablets (750 mg total) by mouth every 12 (twelve) hours. What changed:  medication strength how much to take  Indication: mood stabilization   ergocalciferol 1.25 MG (50000 UT)  capsule Commonly known as: VITAMIN D2 Take 50,000 Units by mouth every Wednesday.  Indication: Vitamin D Deficiency   escitalopram 10 MG tablet Commonly known as: LEXAPRO Take 10 mg by mouth daily.  Indication: Generalized Anxiety Disorder, Major Depressive Disorder,  Posttraumatic Stress Disorder   hydrOXYzine 25 MG tablet Commonly known as: ATARAX Take 1 tablet (25 mg total) by mouth 3 (three) times daily as needed for anxiety.  Indication: Feeling Anxious   nicotine polacrilex 2 MG gum Commonly known as: NICORETTE Take 1 each (2 mg total) by mouth as needed for smoking cessation.  Indication: Nicotine Addiction   paliperidone 234 MG/1.5ML injection Commonly known as: INVEGA SUSTENNA Inject 234 mg into the muscle every 28 (twenty-eight) days. Start taking on: October 02, 2023 What changed:  medication strength how much to take when to take this  Indication: Bipolar w/ paranoia   paliperidone 3 MG 24 hr tablet Commonly known as: INVEGA Take 1 tablet (3 mg total) by mouth daily. Start taking on: September 10, 2023 What changed: when to take this  Indication: Bipolar w/ paranoia   traZODone 50 MG tablet Commonly known as: DESYREL Take 1 tablet (50 mg total) by mouth at bedtime as needed for sleep.  Indication: Trouble Sleeping   zonisamide 100 MG capsule Commonly known as: ZONEGRAN Take 3 capsules (300 mg total) by mouth daily.  Indication: Focal Epilepsy         Follow-up Information     Iona Hansen, NP. Schedule an appointment as soon as possible for a visit.   Specialty: Nurse Practitioner Why: Please schedule an appointment with your primary care provider. Contact information: [address] [place] [address] [phone]         De Hollingshead, MD. Schedule an appointment as soon as possible for a visit.   Specialty: Neurology Why: Sees for neurology Contact information: [address] [place]  [address] [phone]         [facility], [facility] Follow up.   Why: You have an appt for medication management on 09/10/2023 at 12:00 pm, this visit will be in person. Contact information: [address] [place] [address] [phone]                 Follow-up recommendations:    Activity: as tolerated   Diet: heart healthy   Other: -Follow-up with your outpatient psychiatric provider -instructions on appointment date, time, and address (location) are provided to you in discharge paperwork. - Patient will need follow-up appointment arranged with [facility] shot clinic on 10/01/2022, to receive Invega 234 mg IM   -Take your psychiatric medications as prescribed at discharge - instructions are provided to you in the discharge paperwork   -Follow-up with outpatient primary care doctor and other specialists -for management of preventative medicine and chronic medical disease   -Testing: Follow-up with outpatient provider for abnormal lab results:    Ammonia 61, can follow-up outpatient patient was asymptomatic without any signs of confusion or encephalopathy   -If you are prescribed an atypical antipsychotic medication, we recommend that your outpatient psychiatrist follow routine screening for side effects within 3 months of discharge, including monitoring: AIMS scale, height, weight, blood pressure, fasting lipid panel, HbA1c, and fasting blood sugar.    -Recommend total abstinence from alcohol, tobacco, and other illicit drug use at discharge.    -If your psychiatric symptoms recur, worsen, or if you have side effects to your psychiatric medications, call your outpatient psychiatric provider, 911, [phone] or go to the nearest emergency department.   -If suicidal thoughts occur, immediately call your outpatient psychiatric provider, 911, [phone] or go to the nearest emergency department.  Signed: Dr. Peterson Ao,  MD PGY-1, Psychiatry  Residency  09/09/2023, 11:10 AM

## 2023-09-09 NOTE — Progress Notes (Signed)
Patient verbalizes readiness for discharge. All patient belongings returned to patient. Discharge instructions read and discussed with patient (appointments, medications, resources). Patient expressed gratitude for care provided. Patient discharged to lobby at 1310 where sister was waiting.

## 2023-09-09 NOTE — Group Note (Signed)
Recreation Therapy Group Note   Group Topic:Health and Wellness  Group Date: 09/09/2023 Start Time: 1044 End Time: 1120 Facilitators: Zairah Arista-McCall, LRT,CTRS Location: [address] Dayroom   Group Topic: Wellness  Goal Area(s) Addresses:  Patient will define components of whole wellness. Patient will verbalize benefit of whole wellness.  Group Description: Exercise. LRT discussed physical health with patients and the group activity. Patients were instructed they would take turns leading the group in the exercises of their choosing. Patients were instructed to take breaks and get water as needed. LRT provided the music for the wellness activity.    Education: Wellness, Building control surveyor.   Education Outcome: Acknowledges education/In group clarification offered/Needs additional education.    Affect/Mood: N/A   Participation Level: Did not attend    Clinical Observations/Individualized Feedback:     Plan: Continue to engage patient in RT group sessions 2-3x/week.   Shalissa Easterwood-McCall, LRT,CTRS  09/09/2023 12:27 PM

## 2023-09-09 NOTE — [facility] Suicide Risk Assessment (Addendum)
[facility] Discharge Suicide Risk Assessment  Principal Problem: Bipolar I disorder, severe, current or most recent episode manic, with psychotic features [facility]) Discharge Diagnoses: Principal Problem:   Bipolar I disorder, severe, current or most recent episode manic, with psychotic features (HCC) Active Problems:   Marijuana use   Reason for Admission:   Dylan Moon is a 27 y.o., male with a past psychiatric history  of ADHD, PTSD, bipolar disorder, generalized anxiety disorder and a medical diagnosis of seizure disorder who presents to the [facility] Involuntary from [facility] Emergency Department for evaluation and management of hallucinations, aggressive behavior and paranoid delusions.   Hospital Summary  During the patient's hospitalization, patient had extensive initial psychiatric evaluation, and follow-up psychiatric evaluations every day.  Psychiatric diagnoses provided upon initial assessment:  Bipolar I disorder, severe, current or most recent episode manic, with psychotic features (HCC)  Principal Problem:   Bipolar I disorder, severe, current or most recent episode manic, with psychotic features (HCC) Active Problems:   AKI (acute kidney injury) (HCC)   Marijuana use   Hyponatremia  Patient's psychiatric medications were adjusted on admission:  - Received additional dose of 117 mg IM on admission, due to ongoing paranoia, he received 156 mg IM dose of Invega on 12/12 in the ED prior to admission  - Decreased Home Invega to 3 mg daily - Started Cogentin 0.5 mg twice daily for prophylaxis against abnormal movements/rigidity, discuss discontinuing with outpatient provider  - Increased Depakote dose to 750 mg twice daily  - Started hydroxyzine 25 mg three times daily as needed for anxiety  - Started trazodone 50 mg at bedtime as needed for insomnia   During the hospitalization, other adjustments were made to the patient's psychiatric medication regimen:   - Start Invega 234 mg IM on 10/01/2022, discuss with outpatient providers [facility] and schedule appointment with shot clinic  - Continue taking Invega 3 mg daily for 2 weeks then discontinue - Discontinued klonipin 0.25 mg twice daily as needed, patient never requested while inpatient   Patient's care was discussed during the interdisciplinary team meeting every day during the hospitalization.  The patient denied having side effects to prescribed psychiatric medication.  Gradually, patient started adjusting to milieu. The patient was evaluated each day by a clinical provider to ascertain response to treatment. Improvement was noted by the patient's report of decreasing symptoms, improved sleep and appetite, affect, medication tolerance, behavior, and participation in unit programming.  Patient was asked each day to complete a self inventory noting mood, mental status, pain, new symptoms, anxiety and concerns.    Symptoms were reported as significantly decreased or resolved completely by discharge.   On day of discharge, the patient reports that their mood is stable. The patient denied having suicidal thoughts for more than 48 hours prior to discharge.  Patient denies having homicidal thoughts.  Patient denies having auditory hallucinations.  Patient denies any visual hallucinations or other symptoms of psychosis. The patient was motivated to continue taking medication with a goal of continued improvement in mental health.   The patient reports their target psychiatric symptoms of paranoia, aggression and hallucinations responded well to the psychiatric medications, and the patient reports overall benefit other psychiatric hospitalization. Supportive psychotherapy was provided to the patient. The patient also participated in regular group therapy while hospitalized. Coping skills, problem solving as well as relaxation therapies were also part of the unit programming.  Labs were  reviewed with the patient, and abnormal results were  discussed with the patient.  The patient is able to verbalize their individual safety plan to this provider.  # It is recommended to the patient to continue psychiatric medications as prescribed, after discharge from the hospital.    # It is recommended to the patient to follow up with your outpatient psychiatric provider and PCP.  # It was discussed with the patient, the impact of alcohol, drugs, tobacco have been there overall psychiatric and medical wellbeing, and total abstinence from substance use was recommended the patient.ed.  # Prescriptions provided or sent directly to preferred pharmacy at discharge. Patient agreeable to plan. Given opportunity to ask questions. Appears to feel comfortable with discharge.    # In the event of worsening symptoms, the patient is instructed to call the crisis hotline, 911 and or go to the nearest ED for appropriate evaluation and treatment of symptoms. To follow-up with primary care provider for other medical issues, concerns and or health care needs  # Patient was discharged home with his mother with a plan to follow up as noted below.   Total Time spent with patient: 30 minutes  Musculoskeletal: Strength & Muscle Tone: within normal limits Gait & Station: normal Patient leans: N/A  Psychiatric Specialty Exam  Presentation  General Appearance: Appropriate for Environment; Casual  Eye Contact:Good  Speech:Normal Rate  Speech Volume:Normal  Handedness:Right   Mood and Affect  Mood:Euthymic  Duration of Depression Symptoms: None Affect:Congruent   Thought Process  Thought Processes:Coherent  Descriptions of Associations:Intact  Orientation:Full (Time, Place and Person)  Thought Content:Logical  History of Schizophrenia/Schizoaffective disorder:No  Duration of Psychotic Symptoms:N/A  Hallucinations:Hallucinations: None  Ideas of Reference:None  Suicidal  Thoughts:Suicidal Thoughts: No  Homicidal Thoughts:Homicidal Thoughts: No   Sensorium  Memory:Immediate Good; Recent Good  Judgment:Intact  Insight:Present   Executive Functions  Concentration:Good  Attention Span:Good  Recall:Good  Fund of Knowledge:Good  Language:Good   Psychomotor Activity  Psychomotor Activity:Psychomotor Activity: Normal   Assets  Assets:Desire for Improvement; Talents/Skills; Social Support; Housing; Intimacy   Sleep  Sleep:Sleep: Good   Physical Exam: Physical Exam Constitutional:      Appearance: Normal appearance.  Pulmonary:     Effort: Pulmonary effort is normal.  Neurological:     Mental Status: He is alert and oriented to person, place, and time.  Psychiatric:        Attention and Perception: He does not perceive auditory or visual hallucinations.        Mood and Affect: Mood is not anxious or depressed.        Speech: Speech normal.        Behavior: Behavior is not aggressive or combative. Behavior is cooperative.        Thought Content: Thought content is not paranoid or delusional. Thought content does not include homicidal or suicidal ideation.    Review of Systems  Constitutional:  Negative for chills and fever.  Respiratory:  Negative for cough.   Cardiovascular:  Negative for chest pain.  Gastrointestinal:  Negative for constipation, diarrhea, nausea and vomiting.  Musculoskeletal:  Negative for myalgias.  Neurological:  Negative for tingling, weakness and headaches.  Psychiatric/Behavioral:  Negative for depression, hallucinations and suicidal ideas. The patient is not nervous/anxious and does not have insomnia.    Blood pressure 125/72, pulse (!) 58, temperature 98.3 F (36.8 C), temperature source Oral, resp. rate 18, height 5\' 11"  (1.803 m), weight 94.8 kg, SpO2 100%. Body mass index is 29.15 kg/m.  Mental Status Per Nursing Assessment::  On Admission:  NA  Demographic Factors:  Male and Adolescent or  young adult  Loss Factors: NA  Historical Factors: Impulsivity  Risk Reduction Factors:   Sense of responsibility to family, Religious beliefs about death, and Positive social support  Continued Clinical Symptoms:  Bipolar Disorder:   Mixed State Alcohol/Substance Abuse/Dependencies More than one psychiatric diagnosis Previous Psychiatric Diagnoses and Treatments  Cognitive Features That Contribute To Risk:  None    Suicide Risk:  Minimal: No identifiable suicidal ideation. Patients presenting with no risk factors but with morbid ruminations; may be classified as minimal risk based on the severity of the depressive symptoms    Follow-up Information     Iona Hansen, NP. Schedule an appointment as soon as possible for a visit.   Specialty: Nurse Practitioner Why: Please schedule an appointment with your primary care provider. Contact information: [address] [place] [address] [phone]         De Hollingshead, MD. Schedule an appointment as soon as possible for a visit.   Specialty: Neurology Why: Sees for neurology Contact information: [address] [place] [address] [phone]         [facility] Follow up.   Why: You have an appt for medication management on 09/10/2023 at 12:00 pm, this visit will be in person. Contact information: [address] [place] [address] [phone]                 Plan Of Care/Follow-up recommendations:    Activity: as tolerated  Diet: heart healthy  Other: -Follow-up with your outpatient psychiatric provider -instructions on appointment date, time, and address (location) are provided to you in discharge paperwork. - Patient will need follow-up appointment arranged with [facility] shot clinic on 10/01/2022, to receive Invega 234 mg IM   -Take your psychiatric medications as prescribed at discharge - instructions are  provided to you in the discharge paperwork  -Follow-up with outpatient primary care doctor and other specialists -for management of preventative medicine and chronic medical disease  -Testing: Follow-up with outpatient provider for abnormal lab results:   Ammonia 61, can follow-up outpatient patient was asymptomatic without any signs of confusion or encephalopathy  -If you are prescribed an atypical antipsychotic medication, we recommend that your outpatient psychiatrist follow routine screening for side effects within 3 months of discharge, including monitoring: AIMS scale, height, weight, blood pressure, fasting lipid panel, HbA1c, and fasting blood sugar.   -Recommend total abstinence from alcohol, tobacco, and other illicit drug use at discharge.   -If your psychiatric symptoms recur, worsen, or if you have side effects to your psychiatric medications, call your outpatient psychiatric provider, 911, [phone] or go to the nearest emergency department.  -If suicidal thoughts occur, immediately call your outpatient psychiatric provider, 911, [phone] or go to the nearest emergency department.   Signed: Peterson Ao, MD 09/09/2023, 10:06 AM

## 2023-09-09 NOTE — Discharge Instructions (Signed)

## 2023-09-09 NOTE — Progress Notes (Signed)
  [facility] Adult Case Management Discharge Plan :  Will you be returning to the same living situation after discharge:  Yes,  pt returning home with girlfriend, Dylan Moon [phone] At discharge, do you have transportation home?: Yes,  pt's mother Dylan Moon will transport home Do you have the ability to pay for your medications: Yes,  pt has active medical coverage  Release of information consent forms completed and in the chart;  Patient's signature needed at discharge.  Patient to Follow up at:  Follow-up Information     Iona Hansen, NP. Schedule an appointment as soon as possible for a visit.   Specialty: Nurse Practitioner Why: Please schedule an appointment with your primary care provider. Contact information: [address] [place] [address] [phone]         De Hollingshead, MD. Schedule an appointment as soon as possible for a visit.   Specialty: Neurology Why: Sees for neurology Contact information: [address] [place] [address] [phone]         [facility] Follow up.   Why: You have an appt for medication management on 09/10/2023 at 12:00 pm, this visit will be in person. Contact information: [address] [place] [address] [phone]                 Next level of care provider has access to [facility] Link:no  Safety Planning and Suicide Prevention discussed: Yes,  Safety planning completed with pt' mother, Salvatore Marvel and pt's girlfriend.     Has patient been referred to the Quitline?: Patient refused referral for treatment  Patient has been referred for addiction treatment: Patient refused referral for treatment.  [name], [name], LCSWA 09/09/2023, 9:48 AM

## 2023-09-09 NOTE — Plan of Care (Signed)
Patient was able to identify coping skills during recreation therapy group sessions. Patient identified coping skills covering various areas that would be helpful to patient post discharge.   Chianna Spirito-McCall, LRT,CTRS

## 2023-09-09 NOTE — Progress Notes (Signed)
Recreation Therapy Notes  INPATIENT RECREATION TR PLAN  Patient Details Name: HOSKIE DELOE MRN: [id number] DOB: May 19, 1996 Today's Date: 09/09/2023  Rec Therapy Plan Is patient appropriate for Therapeutic Recreation?: Yes Treatment times per week: about 3 days Estimated Length of Stay: 5-7 days TR Treatment/Interventions: Group participation (Comment)  Discharge Criteria Pt will be discharged from therapy if:: Discharged Treatment plan/goals/alternatives discussed and agreed upon by:: Patient/family  Discharge Summary Short term goals set: See patient care plan Short term goals met: Complete Progress toward goals comments: Groups attended Which groups?: Coping skills, Anger management Reason goals not met: None Therapeutic equipment acquired: N/A Reason patient discharged from therapy: Discharge from hospital Pt/family agrees with progress & goals achieved: Yes Date patient discharged from therapy: 09/09/23   Emmons Toth-McCall, LRT,CTRS Khaleel Beckom A Luke Rigsbee-McCall 09/09/2023, 1:13 PM

## 2023-09-11 MED FILL — Paliperidone Palmitate ER Susp Pref Syr 156 MG/ML: INTRAMUSCULAR | Qty: 1 | Status: AC

## 2023-09-25 ENCOUNTER — Encounter (HOSPITAL_COMMUNITY): Payer: MEDICAID | Admitting: Physician Assistant

## 2023-10-01 ENCOUNTER — Telehealth (HOSPITAL_COMMUNITY): Payer: Self-pay | Admitting: Physician Assistant

## 2023-10-02 ENCOUNTER — Other Ambulatory Visit (HOSPITAL_COMMUNITY): Payer: Self-pay | Admitting: Physician Assistant

## 2023-10-02 DIAGNOSIS — R112 Nausea with vomiting, unspecified: Secondary | ICD-10-CM

## 2023-10-02 DIAGNOSIS — F312 Bipolar disorder, current episode manic severe with psychotic features: Secondary | ICD-10-CM

## 2023-10-02 MED ORDER — PROMETHAZINE HCL 6.25 MG/5ML PO SOLN: 6.2500 mg | Freq: Four times a day (QID) | ORAL | 0 refills | Status: DC | PRN

## 2023-10-02 MED ORDER — PALIPERIDONE PALMITATE ER 234 MG/1.5ML IM SUSY: 234.0000 mg | PREFILLED_SYRINGE | INTRAMUSCULAR | 3 refills | Status: AC

## 2023-10-02 NOTE — Progress Notes (Signed)
 Provider was able to reach out to patient's mother regarding her request for patient's Invega  Sustenna injection to be refilled.  She also informed provider that the patient has been experiencing nausea.  She reports that promethazine  has been helpful in managing the patient's nausea in the past.  Provider to refill patient's Invega  Sustenna injection 234 mg / 1.5 mL injection as well as promethazine  6.25 mg / 5 mL solution to pharmacy of choice.

## 2023-10-07 ENCOUNTER — Encounter (HOSPITAL_COMMUNITY): Payer: Self-pay

## 2023-10-07 ENCOUNTER — Ambulatory Visit (HOSPITAL_COMMUNITY): Payer: MEDICAID

## 2023-10-07 VITALS — BP 137/79 | HR 56 | Wt 213.2 lb

## 2023-10-07 DIAGNOSIS — F411 Generalized anxiety disorder: Secondary | ICD-10-CM

## 2023-10-07 DIAGNOSIS — F200 Paranoid schizophrenia: Secondary | ICD-10-CM

## 2023-10-07 DIAGNOSIS — G4700 Insomnia, unspecified: Secondary | ICD-10-CM

## 2023-10-07 MED ORDER — PALIPERIDONE PALMITATE ER 234 MG/1.5ML IM SUSY
234.0000 mg | PREFILLED_SYRINGE | Freq: Once | INTRAMUSCULAR | Status: AC
  Administered 2023-10-07: 234 mg via INTRAMUSCULAR

## 2023-10-07 NOTE — Progress Notes (Cosign Needed)
 Patient presented to the office for Invega sustenna injection 234 Given by Lupita Leash . Pt tolerated injection well with no Complaints and will return in 28 days.

## 2023-10-21 ENCOUNTER — Telehealth (HOSPITAL_COMMUNITY): Payer: Self-pay | Admitting: Physician Assistant

## 2023-10-21 ENCOUNTER — Telehealth (INDEPENDENT_AMBULATORY_CARE_PROVIDER_SITE_OTHER): Payer: MEDICAID | Admitting: Physician Assistant

## 2023-10-21 DIAGNOSIS — R112 Nausea with vomiting, unspecified: Secondary | ICD-10-CM

## 2023-10-21 DIAGNOSIS — F5105 Insomnia due to other mental disorder: Secondary | ICD-10-CM | POA: Diagnosis not present

## 2023-10-21 DIAGNOSIS — F411 Generalized anxiety disorder: Secondary | ICD-10-CM | POA: Diagnosis not present

## 2023-10-21 DIAGNOSIS — F312 Bipolar disorder, current episode manic severe with psychotic features: Secondary | ICD-10-CM

## 2023-10-22 ENCOUNTER — Encounter (HOSPITAL_COMMUNITY): Payer: Self-pay | Admitting: Physician Assistant

## 2023-10-22 MED ORDER — ESCITALOPRAM OXALATE 10 MG PO TABS: 10.0000 mg | ORAL_TABLET | Freq: Every day | ORAL | 1 refills | Status: DC

## 2023-10-22 MED ORDER — DIVALPROEX SODIUM 250 MG PO DR TAB: 750.0000 mg | DELAYED_RELEASE_TABLET | Freq: Two times a day (BID) | ORAL | 1 refills | Status: DC

## 2023-10-22 MED ORDER — TRAZODONE HCL 50 MG PO TABS: 50.0000 mg | ORAL_TABLET | Freq: Every evening | ORAL | 1 refills | Status: AC | PRN

## 2023-10-22 MED ORDER — PROMETHAZINE HCL 6.25 MG/5ML PO SOLN: 12.5000 mg | Freq: Four times a day (QID) | ORAL | 0 refills | Status: DC | PRN

## 2023-10-22 MED ORDER — BENZTROPINE MESYLATE 0.5 MG PO TABS: 0.5000 mg | ORAL_TABLET | Freq: Two times a day (BID) | ORAL | 1 refills | Status: DC

## 2023-10-22 NOTE — Progress Notes (Signed)
BH MD/PA/NP OP Progress Note  Virtual Visit via Video Note  I connected with Dylan Moon on 10/21/23 at  4:00 PM EST by a video enabled telemedicine application and verified that I am speaking with the correct person using two identifiers.  Location: Patient: Home Provider: Clinic   I discussed the limitations of evaluation and management by telemedicine and the availability of in person appointments. The patient expressed understanding and agreed to proceed.  Follow Up Instructions:  I discussed the assessment and treatment plan with the patient. The patient was provided an opportunity to ask questions and all were answered. The patient agreed with the plan and demonstrated an understanding of the instructions.   The patient was advised to call back or seek an in-person evaluation if the symptoms worsen or if the condition fails to improve as anticipated.  I provided 38 minutes of non-face-to-face time during this encounter.  Meta Hatchet, PA    10/21/2023 10:29 PM Dylan Moon  MRN:  [id number]  Chief Complaint:  Chief Complaint  Patient presents with   Follow-up   Medication Management   HPI:   Dylan Moon is a 28 year old male with a past psychiatric history significant for bipolar 1 disorder (severe, most recent episode manic, with psychotic features), generalized anxiety disorder, and insomnia who presents to [facility], accompanied by his mother Dylan Moon, [phone]).  Patient is currently being managed on the following psychiatric medications:  Paliperidone [name] Lorelei Pont) 234 mg / 1.5 mL intramuscular injection every 28 days Benztropine 0.5 mg 2 times daily Divalproex 750 mg DR every 12 hours Escitalopram 10 mg daily Hydroxyzine 25 mg 3 times daily as needed Trazodone 50 mg at bedtime  Patient reports that things have been all right but he has been somewhat paranoid lately.  Per patient's mother,  whenever patient gets paranoid, she will give him Risperdal and hydroxyzine.  She reports that the last time she gave him risperidone and hydroxyzine during his paranoia he calmed down but was still anxious.  She reports that he last felt paranoid last week characterized by the patient believing that people were after him.  Patient's mother believes that his Gean Birchwood injection should be increased.  Provider informed patient's mother that patient is currently on the max dosage of his [place].  Provider also informed patient's mother to start giving the patient risperidone during his moments of paranoia.  Patient continues to endorse some depression and rates his depression a 6 out of 10 with 10 being most severe.  Patient endorses depressive episodes 3 days out of the week.  Patient endorses the following depressive symptoms: decreased energy and lack of motivation.  A PHQ-9 screen was performed with the patient scoring a 15.  A GAD-7 screen was also performed with the patient scoring a 16.  Patient is alert and oriented x 4, calm, cooperative, and fully engaged in conversation during the encounter.  Patient endorses good mood.  Patient denies suicidal or homicidal ideations.  He further denies auditory or visual hallucinations and does not appear to be responding to internal/external stimuli.  Patient endorses good sleep and receives on average 6 to 7 hours of sleep per night.  Patient endorses good appetite and eats on average 3 meals per day.  Patient denies alcohol consumption.  Patient endorses tobacco use and smokes on average one Black and Mild per day.  Patient endorses illicit drug use in the form of marijuana.  Visit Diagnosis:  ICD-10-CM   1. Bipolar I disorder, severe, current or most recent episode manic, with psychotic features (HCC)  F31.2 escitalopram (LEXAPRO) 10 MG tablet    divalproex (DEPAKOTE) 250 MG DR tablet    benztropine (COGENTIN) 0.5 MG tablet    2. Nausea and  vomiting, unspecified vomiting type  R11.2 promethazine (PHENERGAN) 6.25 MG/5ML solution    3. Insomnia due to other mental disorder  F51.05 traZODone (DESYREL) 50 MG tablet   F99     4. Generalized anxiety disorder  F41.1 escitalopram (LEXAPRO) 10 MG tablet      Past Psychiatric History:  Patient has a past psychiatric history significant for bipolar disorder.  Patient also has a history of intellectual disability -- Following discharge from [facility], patient has a documented diagnosis of bipolar affective disorder, current episode manic with psychotic symptoms   Patient has a past history of hospitalization due to mental health.  Patient was recently hospitalized at [facility] on 07/16/2023.   Patient denies a past history of suicide attempts   Patient denies a past history of homicide attempts  Past Medical History:  Past Medical History:  Diagnosis Date   ADHD    Bipolar 1 disorder (HCC)    Schizoaffective disorder (HCC)    Scoliosis    Seizures (HCC)    most recent 12/02/17    Past Surgical History:  Procedure Laterality Date   NO PAST SURGERIES      Family Psychiatric History:  Per patient's mother, her father's side of the family has a history of mental illness.  She reports that her mother's side of the family has a history of anxiety and depression   Family history of suicide attempt: Patient denies Family history of homicide attempt: Patient denies Family history of substance abuse: Patient's mother states that she and her husband used cocaine she reports that her abused alcohol and marijuana.  Family History:  Family History  Problem Relation Age of Onset   Diabetes Mother    Hypertension Mother    Cancer Other    Diabetes Father    Seizures Maternal Grandfather     Social History:  Social History   Socioeconomic History   Marital status: Single    Spouse name: Not on file   Number of children: 0   Years of education: HS   Highest education level: Not on file   Occupational History   Occupation: Product manager of videos  Tobacco Use   Smoking status: Some Days    Types: Cigars   Smokeless tobacco: Never  Vaping Use   Vaping status: Never Used  Substance and Sexual Activity   Alcohol use: Not Currently   Drug use: Yes    Types: Marijuana    Comment: Daily use of marijuana.   Sexual activity: Not Currently  Other Topics Concern   Not on file  Social History Narrative   Lives at home his mother.   3-4 sodas per week.   Right-handed.   Social Drivers of Corporate investment banker Strain: Not on file  Food Insecurity: Patient Declined (09/04/2023)   Hunger Vital Sign    Worried About Running Out of Food in the Last Year: Patient declined    Ran Out of Food in the Last Year: Patient declined  Recent Concern: Food Insecurity - Food Insecurity Present (07/16/2023)   Hunger Vital Sign    Worried About Running Out of Food in the Last Year: Often true    Ran Out of Food in the Last  Year: Sometimes true  Transportation Needs: Patient Declined (09/04/2023)   PRAPARE - Administrator, Civil Service (Medical): Patient declined    Lack of Transportation (Non-Medical): Patient declined  Physical Activity: Not on file  Stress: Not on file  Social Connections: Not on file    Allergies:  Allergies  Allergen Reactions   Keppra [Levetiracetam] Other (See Comments)    Irritability, mental status changes   Tramadol Other (See Comments)    Mixed with his other meds can cause seizures     Vimpat [Lacosamide] Other (See Comments)    Causes anger, mental status changes    Metabolic Disorder Labs: Lab Results  Component Value Date   HGBA1C 5.6 07/17/2023   MPG 114.02 07/17/2023   MPG 111.15 01/30/2023   Lab Results  Component Value Date   PROLACTIN 40.3 (H) 12/06/2022   Lab Results  Component Value Date   CHOL 199 07/17/2023   TRIG 193 (H) 07/17/2023   HDL 59 07/17/2023   CHOLHDL 3.4 07/17/2023   VLDL 39  07/17/2023   LDLCALC 101 (H) 07/17/2023   LDLCALC 91 01/30/2023   Lab Results  Component Value Date   TSH 2.536 07/18/2023   TSH 0.706 01/29/2023    Therapeutic Level Labs: No results found for: "LITHIUM" Lab Results  Component Value Date   VALPROATE 93 09/09/2023   VALPROATE 75 09/03/2023   No results found for: "CBMZ"  Current Medications: Current Outpatient Medications  Medication Sig Dispense Refill   benztropine (COGENTIN) 0.5 MG tablet Take 1 tablet (0.5 mg total) by mouth 2 (two) times daily. 60 tablet 1   carvedilol (COREG) 25 MG tablet Take 1 tablet (25 mg total) by mouth 2 (two) times daily with a meal. 60 tablet 0   divalproex (DEPAKOTE) 250 MG DR tablet Take 3 tablets (750 mg total) by mouth every 12 (twelve) hours. 180 tablet 1   ergocalciferol (VITAMIN D2) 1.25 MG (50000 UT) capsule Take 50,000 Units by mouth every Wednesday.     escitalopram (LEXAPRO) 10 MG tablet Take 1 tablet (10 mg total) by mouth daily. 30 tablet 1   Eslicarbazepine Acetate (APTIOM) 800 MG TABS Take 1,600 mg by mouth daily. 60 tablet 0   hydrOXYzine (ATARAX) 25 MG tablet Take 1 tablet (25 mg total) by mouth 3 (three) times daily as needed for anxiety. 90 tablet 0   nicotine polacrilex (NICORETTE) 2 MG gum Take 1 each (2 mg total) by mouth as needed for smoking cessation. 100 tablet 0   paliperidone (INVEGA SUSTENNA) 234 MG/1.5ML injection Inject 234 mg into the muscle every 28 (twenty-eight) days. 1.5 mL 3   paliperidone (INVEGA) 3 MG 24 hr tablet Take 1 tablet (3 mg total) by mouth daily. 14 tablet 0   promethazine (PHENERGAN) 6.25 MG/5ML solution Take 10 mLs (12.5 mg total) by mouth every 6 (six) hours as needed for nausea or vomiting. 120 mL 0   traZODone (DESYREL) 50 MG tablet Take 1 tablet (50 mg total) by mouth at bedtime as needed for sleep. 30 tablet 1   zonisamide (ZONEGRAN) 100 MG capsule Take 3 capsules (300 mg total) by mouth daily. 90 capsule 0   No current facility-administered  medications for this visit.     Musculoskeletal: Strength & Muscle Tone: within normal limits Gait & Station: normal Patient leans: N/A  Psychiatric Specialty Exam: Review of Systems  Psychiatric/Behavioral:  Positive for behavioral problems and dysphoric mood. Negative for decreased concentration, hallucinations, self-injury, sleep disturbance and suicidal  ideas. The patient is nervous/anxious. The patient is not hyperactive.     There were no vitals taken for this visit.There is no height or weight on file to calculate BMI.  General Appearance: Casual  Eye Contact:  Fair  Speech:  Clear and Coherent and Normal Rate  Volume:  Normal  Mood:  Anxious and Depressed  Affect:  Congruent  Thought Process:  Coherent, Goal Directed, and Descriptions of Associations: Intact  Orientation:  Full (Time, Place, and Person)  Thought Content: WDL   Suicidal Thoughts:  No  Homicidal Thoughts:  No  Memory:  Immediate;   Good Recent;   Fair Remote;   Fair  Judgement:  Fair  Insight:  Lacking  Psychomotor Activity:  Normal  Concentration:  Concentration: Good and Attention Span: Good  Recall:  [employer] of Knowledge: Fair  Language: Fair  Akathisia:  NA  Handed:  Right  AIMS (if indicated): not done  Assets:  Communication Skills Desire for Improvement Financial Resources/Insurance Housing Social Support Transportation  ADL's:  Intact  Cognition: Impaired,  Mild  Sleep:  Fair   Screenings: AIMS    Flowsheet Row [facility]  AIMS Total Score 0      AUDIT    Flowsheet Row [facility]) [facility] Ad[facility] ([facility] Ad[facility] (Discharged[facility]  Alcohol Use Disorder Identification Test Final Score (AUDIT) 0 0 2       GAD-7    Flowsheet Row Video Visit from 10/21/2023 in [facility] Office Visit from 08/06/2023 in [facility] Video Visit from 06/01/2021 in [facility] Office Visit from 04/13/2021 in [facility] Office Visit from 02[facility]07/2021 in [facility]  Total GAD-7 Score 16 0 0 2 3      PHQ2-9    Flowsheet Row Video Visit from 10/21/2023 in [facility] Office Visit from 08/06/2023 in [facility] Office Visit from 11/13/2021 in [facility] Video Visit from 06/01/2021 in [facility] Office Visit from 04/13/2021 in [facility]  PHQ-2 Total Score 3 2 1  0 0  PHQ-9 Total Score 15 10 -- -- --      Flowsheet Row Video Visit from 10/21/2023 in [facility] Admission (Discharged) [facility] ED from 08/25/2023 in [facility]  C-SSRS RISK CATEGORY No Risk No Risk No Risk        Assessment and Plan:   Diron E. Hettich is a 28 year old male with a past psychiatric history significant for bipolar 1 disorder (severe, most recent episode manic, with psychotic features), generalized anxiety disorder, and insomnia who presents to [facility], accompanied by his mother Dylan Moon, [phone]).  Per patient's mother, patient has been experiencing episodes of paranoia on occasion.  During these moments of paranoia, she reports that she gets the patient risperidone and hydroxyzine.  She believes that his [place] injection should be increased.  Provider informed patient's mother that he was at the max dosage of his [place] injection.  Provider also instructed  patient's mother not to give patient risperidone during his moments  of paranoia.  She vocalized understanding.  During the encounter, patient admitted to smoking marijuana.  Provider instructed patient to discontinue his use of marijuana to avoid instances of paranoia.  Patient vocalized understanding.  Provider to look into other options to managing patient's symptoms of paranoia.  Patient continues to endorse episodes of nausea.  Patient is currently taking promethazine as needed to manage his nausea.  Provider to increase patient's promethazine to 12.5 mg for the management of his nausea.  Patient vocalized understanding.  Though patient endorses depression, patient is agreeable to continuing to take his medications as prescribed.  Patient's medications to be e-prescribed to pharmacy of choice.  Due to patient's use of [place], the following labs to be obtained after patient's next encounter: Lipid profile, complete blood count with differential, complete metabolic panel, and hemoglobin A1c, and valproic acid level.  Patient vocalized understanding.  Collaboration of Care: Collaboration of Care: Medication Management AEB provider managing patient's psychiatric medications, Primary Care Provider AEB patient being seen by primary care provider (family medicine), and Psychiatrist AEB patient being seen by a mental health provider at this facility  Patient/Guardian was advised Release of Information must be obtained prior to any record release in order to collaborate their care with an outside provider. Patient/Guardian was advised if they have not already done so to contact the registration department to sign all necessary forms in order for [place] to release information regarding their care.   Consent: Patient/Guardian gives verbal consent for treatment and assignment of benefits for services provided during this visit. Patient/Guardian expressed understanding and agreed to proceed.   1. Nausea  and vomiting, unspecified vomiting type  - promethazine (PHENERGAN) 6.25 MG/5ML solution; Take 10 mLs (12.5 mg total) by mouth every 6 (six) hours as needed for nausea or vomiting.  Dispense: 120 mL; Refill: 0  2. Insomnia due to other mental disorder  - traZODone (DESYREL) 50 MG tablet; Take 1 tablet (50 mg total) by mouth at bedtime as needed for sleep.  Dispense: 30 tablet; Refill: 1  3. Bipolar I disorder, severe, current or most recent episode manic, with psychotic features (HCC) (Primary)  - escitalopram (LEXAPRO) 10 MG tablet; Take 1 tablet (10 mg total) by mouth daily.  Dispense: 30 tablet; Refill: 1 - divalproex (DEPAKOTE) 250 MG DR tablet; Take 3 tablets (750 mg total) by mouth every 12 (twelve) hours.  Dispense: 180 tablet; Refill: 1 - benztropine (COGENTIN) 0.5 MG tablet; Take 1 tablet (0.5 mg total) by mouth 2 (two) times daily.  Dispense: 60 tablet; Refill: 1  4. Generalized anxiety disorder  - escitalopram (LEXAPRO) 10 MG tablet; Take 1 tablet (10 mg total) by mouth daily.  Dispense: 30 tablet; Refill: 1  Patient to follow up in 6 weeks Provider spent a total of 38 minutes with the patient/reviewing patient's chart  Meta Hatchet, PA 10/21/2023, 10:29 PM

## 2023-10-27 ENCOUNTER — Other Ambulatory Visit (HOSPITAL_COMMUNITY): Payer: Self-pay | Admitting: Physician Assistant

## 2023-10-27 DIAGNOSIS — R112 Nausea with vomiting, unspecified: Secondary | ICD-10-CM

## 2023-10-28 NOTE — Telephone Encounter (Signed)
 Message acknowledged and reviewed.

## 2023-11-04 ENCOUNTER — Ambulatory Visit (HOSPITAL_COMMUNITY): Payer: MEDICAID

## 2023-11-11 ENCOUNTER — Emergency Department (HOSPITAL_COMMUNITY)
Admission: EM | Admit: 2023-11-11 | Discharge: 2023-11-11 | Discharge status: Against Medical Advice | Payer: MEDICAID | Source: Home / Self Care

## 2023-11-13 ENCOUNTER — Telehealth (HOSPITAL_COMMUNITY): Payer: Self-pay | Admitting: Physician Assistant

## 2023-11-14 NOTE — Telephone Encounter (Signed)
Patient is currently managed on Invega 234 mg monthly.  Per chart review patient was on a higher dose because of increased paranoia and psychosis.  Patients mother informed Clinical research associate that he now attends [employer] team and his dose was reduced to 156 mg monthly.  Provider informed patient's mother that generally people are managed on a lower dose of 156 mg monthly.  Provider recommended that if she and her son wants a dose to be increased that they should request his medical records and present them to his Act team. Provider informed patients mother that his records will detail why he was on a higher dose.  She endorsed understanding and notes that she may c

## 2023-11-14 NOTE — Telephone Encounter (Signed)
Patient is currently managed on Invega 234 mg monthly.  Per chart review patient was on a higher dose because of increased paranoia and psychosis.  Patients mother informed Clinical research associate that he now attends [employer] team and his dose was reduced to 156 mg monthly.  Provider informed patient's mother that generally people are managed on a lower dose of 156 mg monthly.  Provider recommended that if she and her son wants a dose to be increased that they should request his medical records and present them to his Act team. Provider informed patients mother that his records will detail why he was on a higher dose.  She endorsed understanding and notes that she may come and pick up his records to present to his new act team. No other concerns noted at this time.

## 2023-11-15 ENCOUNTER — Other Ambulatory Visit: Payer: Self-pay

## 2023-11-15 ENCOUNTER — Emergency Department (HOSPITAL_COMMUNITY)
Admission: EM | Admit: 2023-11-15 | Discharge: 2023-11-15 | Discharge status: Against Medical Advice | Payer: MEDICAID | Attending: Emergency Medicine | Admitting: Emergency Medicine

## 2023-11-15 ENCOUNTER — Encounter (HOSPITAL_COMMUNITY): Payer: Self-pay | Admitting: Emergency Medicine

## 2023-11-15 DIAGNOSIS — Z5321 Procedure and treatment not carried out due to patient leaving prior to being seen by health care provider: Secondary | ICD-10-CM | POA: Insufficient documentation

## 2023-11-15 DIAGNOSIS — R4689 Other symptoms and signs involving appearance and behavior: Secondary | ICD-10-CM | POA: Diagnosis present

## 2023-11-15 NOTE — ED Notes (Signed)
  Patient came through triage and walked out.  Family followed shortly after stating they were going to take him to [facility].

## 2023-11-15 NOTE — ED Notes (Signed)
Pt. Walked out of ED.

## 2023-11-15 NOTE — ED Triage Notes (Signed)
  Patient BIB girlfriend for psychiatric evaluation.  Patient recently had medication decreased and has not been acting like himself per mom and girlfriend.  Patient denies any SI/HI.  Family states he got confused around 30 minutes ago and has been irritable.  Denies any pain.  Possible drug use.  No ETOH.

## 2023-11-19 ENCOUNTER — Other Ambulatory Visit (HOSPITAL_COMMUNITY): Payer: Self-pay | Admitting: Physician Assistant

## 2023-11-19 DIAGNOSIS — R112 Nausea with vomiting, unspecified: Secondary | ICD-10-CM

## 2023-11-19 NOTE — Telephone Encounter (Signed)
 Message acknowledged and reviewed.

## 2023-12-01 ENCOUNTER — Other Ambulatory Visit (HOSPITAL_COMMUNITY): Payer: Self-pay | Admitting: Physician Assistant

## 2023-12-01 DIAGNOSIS — R112 Nausea with vomiting, unspecified: Secondary | ICD-10-CM

## 2023-12-02 ENCOUNTER — Encounter (HOSPITAL_COMMUNITY): Payer: MEDICAID | Admitting: Physician Assistant

## 2023-12-24 ENCOUNTER — Other Ambulatory Visit (HOSPITAL_COMMUNITY): Payer: Self-pay | Admitting: Physician Assistant

## 2023-12-24 DIAGNOSIS — F5105 Insomnia due to other mental disorder: Secondary | ICD-10-CM

## 2024-01-13 ENCOUNTER — Other Ambulatory Visit (HOSPITAL_COMMUNITY): Payer: Self-pay | Admitting: Physician Assistant

## 2024-01-13 DIAGNOSIS — F5105 Insomnia due to other mental disorder: Secondary | ICD-10-CM

## 2024-02-15 ENCOUNTER — Ambulatory Visit (HOSPITAL_COMMUNITY)
Admission: EM | Admit: 2024-02-15 | Discharge: 2024-02-16 | Disposition: A | Discharge status: Other Acute Inpatient Hospital | Payer: MEDICAID | Attending: Nurse Practitioner | Admitting: Nurse Practitioner

## 2024-02-15 DIAGNOSIS — F250 Schizoaffective disorder, bipolar type: Secondary | ICD-10-CM | POA: Diagnosis not present

## 2024-02-15 LAB — LIPID PANEL
Cholesterol: 198 mg/dL (ref 0–200)
HDL: 47 mg/dL (ref >40)
LDL Cholesterol: 134 mg/dL — ABNORMAL HIGH (ref 0–99)
Total CHOL/HDL Ratio: 4.2 ratio
Triglycerides: 83 mg/dL (ref <150)
VLDL: 17 mg/dL (ref 0–40)

## 2024-02-15 LAB — CBC WITH DIFFERENTIAL/PLATELET
Abs Immature Granulocytes: 0.02 10*3/uL (ref 0.00–0.07)
Basophils Absolute: 0 10*3/uL (ref 0.0–0.1)
Basophils Relative: 0 %
Eosinophils Absolute: 0 10*3/uL (ref 0.0–0.5)
Eosinophils Relative: 0 %
HCT: 44.5 % (ref 39.0–52.0)
Hemoglobin: 14.9 g/dL (ref 13.0–17.0)
Immature Granulocytes: 0 %
Lymphocytes Relative: 33 %
Lymphs Abs: 1.8 10*3/uL (ref 0.7–4.0)
MCH: 28.4 pg (ref 26.0–34.0)
MCHC: 33.5 g/dL (ref 30.0–36.0)
MCV: 84.8 fL (ref 80.0–100.0)
Monocytes Absolute: 0.4 10*3/uL (ref 0.1–1.0)
Monocytes Relative: 7 %
Neutro Abs: 3.3 10*3/uL (ref 1.7–7.7)
Neutrophils Relative %: 60 %
Platelets: 257 10*3/uL (ref 150–400)
RBC: 5.25 MIL/uL (ref 4.22–5.81)
RDW: 12.8 % (ref 11.5–15.5)
WBC: 5.5 10*3/uL (ref 4.0–10.5)
nRBC: 0 % (ref 0.0–0.2)

## 2024-02-15 LAB — HEMOGLOBIN A1C
Hgb A1c MFr Bld: 5.2 % (ref 4.8–5.6)
Mean Plasma Glucose: 102.54 mg/dL

## 2024-02-15 LAB — COMPREHENSIVE METABOLIC PANEL WITH GFR
ALT: 14 U/L (ref 0–44)
AST: 15 U/L (ref 15–41)
Albumin: 4.6 g/dL (ref 3.5–5.0)
Alkaline Phosphatase: 37 U/L — ABNORMAL LOW (ref 38–126)
Anion gap: 12 (ref 5–15)
BUN: 14 mg/dL (ref 6–20)
CO2: 22 mmol/L (ref 22–32)
Calcium: 9.7 mg/dL (ref 8.9–10.3)
Chloride: 103 mmol/L (ref 98–111)
Creatinine, Ser: 1.1 mg/dL (ref 0.61–1.24)
GFR, Estimated: >60 mL/min (ref >60)
Glucose, Bld: 74 mg/dL (ref 70–99)
Potassium: 4.1 mmol/L (ref 3.5–5.1)
Sodium: 137 mmol/L (ref 135–145)
Total Bilirubin: 0.8 mg/dL (ref 0.0–1.2)
Total Protein: 7.5 g/dL (ref 6.5–8.1)

## 2024-02-15 LAB — POCT URINE DRUG SCREEN - MANUAL ENTRY (I-SCREEN)
POC Amphetamine UR: NOT DETECTED
POC Buprenorphine (BUP): NOT DETECTED
POC Cocaine UR: NOT DETECTED
POC Marijuana UR: POSITIVE — AB
POC Methadone UR: NOT DETECTED
POC Methamphetamine UR: NOT DETECTED
POC Morphine: NOT DETECTED
POC Oxazepam (BZO): NOT DETECTED
POC Oxycodone UR: NOT DETECTED
POC Secobarbital (BAR): NOT DETECTED

## 2024-02-15 LAB — TSH: TSH: 0.562 u[IU]/mL (ref 0.350–4.500)

## 2024-02-15 LAB — VALPROIC ACID LEVEL: Valproic Acid Lvl: 47 ug/mL — ABNORMAL LOW (ref 50–100)

## 2024-02-15 LAB — ETHANOL: Alcohol, Ethyl (B): <15 mg/dL (ref <15)

## 2024-02-15 MED ORDER — ALUM & MAG HYDROXIDE-SIMETH 200-200-20 MG/5ML PO SUSP: 30.0000 mL | ORAL | Status: DC | PRN

## 2024-02-15 MED ORDER — NICOTINE POLACRILEX 2 MG MT GUM: 2.0000 mg | CHEWING_GUM | OROMUCOSAL | Status: DC | PRN

## 2024-02-15 MED ORDER — DIPHENHYDRAMINE HCL 50 MG/ML IJ SOLN: 50.0000 mg | Freq: Three times a day (TID) | INTRAMUSCULAR | Status: DC | PRN

## 2024-02-15 MED ORDER — HYDROXYZINE HCL 25 MG PO TABS: 25.0000 mg | ORAL_TABLET | Freq: Three times a day (TID) | ORAL | Status: DC | PRN

## 2024-02-15 MED ORDER — CLONIDINE HCL 0.1 MG PO TABS: 0.1000 mg | ORAL_TABLET | Freq: Two times a day (BID) | ORAL | Status: DC | PRN

## 2024-02-15 MED ORDER — CARVEDILOL 25 MG PO TABS
25.0000 mg | ORAL_TABLET | Freq: Two times a day (BID) | ORAL | Status: DC
  Administered 2024-02-15: 25 mg via ORAL
  Filled 2024-02-15: qty 1

## 2024-02-15 MED ORDER — TRAZODONE HCL 50 MG PO TABS
50.0000 mg | ORAL_TABLET | Freq: Every evening | ORAL | Status: DC | PRN
  Administered 2024-02-15: 50 mg via ORAL
  Filled 2024-02-15: qty 1

## 2024-02-15 MED ORDER — HALOPERIDOL LACTATE 5 MG/ML IJ SOLN: 10.0000 mg | Freq: Three times a day (TID) | INTRAMUSCULAR | Status: DC | PRN

## 2024-02-15 MED ORDER — PALIPERIDONE ER 3 MG PO TB24
3.0000 mg | ORAL_TABLET | Freq: Every day | ORAL | Status: DC
  Administered 2024-02-15: 3 mg via ORAL
  Filled 2024-02-15: qty 1

## 2024-02-15 MED ORDER — OLANZAPINE 5 MG PO TBDP: 5.0000 mg | ORAL_TABLET | Freq: Three times a day (TID) | ORAL | Status: DC | PRN

## 2024-02-15 MED ORDER — LORAZEPAM 2 MG/ML IJ SOLN: 2.0000 mg | Freq: Three times a day (TID) | INTRAMUSCULAR | Status: DC | PRN

## 2024-02-15 MED ORDER — HALOPERIDOL LACTATE 5 MG/ML IJ SOLN: 5.0000 mg | Freq: Three times a day (TID) | INTRAMUSCULAR | Status: DC | PRN

## 2024-02-15 MED ORDER — DIVALPROEX SODIUM 500 MG PO DR TAB
750.0000 mg | DELAYED_RELEASE_TABLET | Freq: Two times a day (BID) | ORAL | Status: DC
  Administered 2024-02-15: 750 mg via ORAL
  Filled 2024-02-15: qty 1

## 2024-02-15 MED ORDER — ESCITALOPRAM OXALATE 10 MG PO TABS
10.0000 mg | ORAL_TABLET | Freq: Every day | ORAL | Status: DC
  Administered 2024-02-15: 10 mg via ORAL
  Filled 2024-02-15: qty 1

## 2024-02-15 MED ORDER — VITAMIN D (ERGOCALCIFEROL) 1.25 MG (50000 UNIT) PO CAPS
50000.0000 [IU] | ORAL_CAPSULE | ORAL | Status: DC
Start: 2024-02-18 — End: 2024-02-16

## 2024-02-15 MED ORDER — ESLICARBAZEPINE ACETATE 800 MG PO TABS: 1600.0000 mg | ORAL_TABLET | Freq: Every day | ORAL | Status: DC

## 2024-02-15 MED ORDER — BENZTROPINE MESYLATE 0.5 MG PO TABS
0.5000 mg | ORAL_TABLET | Freq: Two times a day (BID) | ORAL | Status: DC
  Administered 2024-02-15: 0.5 mg via ORAL
  Filled 2024-02-15: qty 1

## 2024-02-15 MED ORDER — ZONISAMIDE 100 MG PO CAPS
300.0000 mg | ORAL_CAPSULE | Freq: Every day | ORAL | Status: DC
  Administered 2024-02-15: 300 mg via ORAL
  Filled 2024-02-15: qty 3

## 2024-02-15 MED ORDER — ACETAMINOPHEN 325 MG PO TABS: 650.0000 mg | ORAL_TABLET | Freq: Four times a day (QID) | ORAL | Status: DC | PRN

## 2024-02-15 MED ORDER — CLONIDINE HCL 0.1 MG PO TABS
0.1000 mg | ORAL_TABLET | Freq: Once | ORAL | Status: AC
Start: 2024-02-15 — End: 2024-02-15
  Administered 2024-02-15: 0.1 mg via ORAL
  Filled 2024-02-15: qty 1

## 2024-02-15 MED ORDER — MAGNESIUM HYDROXIDE 400 MG/5ML PO SUSP: 30.0000 mL | Freq: Every day | ORAL | Status: DC | PRN

## 2024-02-15 NOTE — ED Provider Notes (Signed)
 Behavioral Health Urgent Care Medical Screening Exam  Patient Name: Dylan Moon MRN: [id number] Date of Evaluation: 02/15/24 Chief Complaint:  Psychosis & SI Diagnosis:  Final diagnoses:  Schizoaffective disorder, bipolar type (HCC)   History of Present illness: Dylan Moon is a 28 y.o. AA male with a prior mental health diagnosis of Schizoaffective d/o who presented today to the [facility] behavioral health center via [employer] due to concerns for paranoia and suicidal ideations. As per IVC petition initiated by pt's mother Peggye Camera): " [name] has been diagnosed with being bipolar and with anxiety. [name] is not taking care of his personal hygiene and is not sleeping [name] thinks someone is outside to get him on social media and thinks people are watching him from outside through the window [name] is throwing things and trashing the house [name] is drinking alcohol every day and smokes marijuana often [name] was committed that Jan 2025."  Assessment: Patient is assessed by Clinical research associate who is familiar with pt from his prior hospitalization at the [facility] behavioral health hospital. Pt states that his mother called the police because she thought that he made suicidal remarks, but denies that he made the remarks. He is calm during assessment, denies SI/HI/AVH. Denies any prior suicide attempts.  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 Pt denies any other substance use, other than THC. States that his doctor told him to cut  down to two blunts daily and that this is what he cut down to. He denies ETOH use, denies use of any other recreational substance.  Pt shares that he has been taking his medications as prescribed. Denies most depressive symptoms, but admits to decreased interest in doing things which make him happy, states that his is stressed because of the recent death of his Melanee Spire Father who was 44 yrs old.  Collateral information: Call placed to petitioner: Elva Hamburger (Mother) [phone] (Mobile)-writer called 3 times, and call was not answered.  Writer proceeded to calling the ACT team, PSI, there was also no anxiety.  Writer called patient's girlfriend with whom he resides at  [phone] -[place]-there was no answer, initially, but she was able to call back, and provided collateral.She stated that pt has been more paranoid than usual even though he has some paranoia at baseline; she shares that pt feels as though people are watching him, she states that they live in an apartment and have dogs, and whenever the dogs bark, patient feels as though people are coming to get him.  Reports that typically, patient does not feel this way when the dogs are barking.  She reports that patient has been getting more agitated, "moody" lately, reports that prior to this presentation, he left their apartment when she told him that she would call his mother to let her know that he was not doing so well. She shares that the police found him at a gas station parking lot prior to bringing him in. GF shares that pt has been smoking THC, even though he has significantly cut back  on it  and currently only smokes a blunt per day.  First examination completed, and based on reasons for presentation, pt's mental health background, information obtained from GF as well as face to face assessment by writer during which thoughts are currently impaired, we will uphold IVC. Patient currently presents as a moderate risk of danger to self.    Suicide Risk assessment Moderate:   Denies SI at this time, has a mental illness, with some risk factors for suicide present, and identifiable protective factors, including available and accessible social support. There are concerns that if not treated, current symptoms will worsen.   -Recommendations made for inpatient behavioral health hospitalization for treatment and stabilization of mental status.   -Ordered baseline labs, EKG, agitation protocol -Patient has been accepted to the [facility] behavioral health hospital and we are awaiting labs. -Restarted Home meds Started Clonidine  0.1 mg for SBP>160 or DBP>100  Flowsheet Row ED from 02/15/2024 in [facility] ED from 11/15/2023 in [facility] Emergency Department at [facility] Video Visit from 10/21/2023 in [facility]  C-SSRS RISK CATEGORY No Risk No Risk No Risk       Psychiatric Specialty Exam  Presentation  General Appearance:Disheveled  Eye Contact:Fair  Speech:Normal Rate  Speech Volume:Decreased  Handedness:Right   Mood and Affect  Mood: Depressed  Affect: Congruent   Thought Process  Thought Processes: Coherent  Descriptions of Associations:Intact  Orientation:Partial  Thought Content:Paranoid Ideation  Diagnosis of Schizophrenia or Schizoaffective disorder in past: Yes   Hallucinations:None  Ideas of Reference:Percusatory; Paranoia; Delusions  Suicidal Thoughts:No  Homicidal Thoughts:No   Sensorium  Memory: Immediate Fair  Judgment: Poor  Insight: Poor   Executive Functions  Concentration: Fair  Attention Span: Fair  Recall: Fair  Fund of Knowledge: Fair  Language: Fair   Psychomotor Activity  Psychomotor Activity: Normal   Assets  Assets: Resilience; Social Support   Sleep  Sleep: Poor  Number of hours:  10.5   Physical Exam: Physical Exam Vitals and nursing note reviewed.  HENT:      Head: Normocephalic.  Musculoskeletal:        General: Normal range of motion.  Psychiatric:        Behavior: Behavior normal.    Review of Systems  Psychiatric/Behavioral:  Positive for depression, hallucinations and substance abuse. Negative for memory loss and suicidal ideas. The patient is nervous/anxious and has insomnia.   All other systems reviewed and are negative.  Blood pressure (!) 160/102, pulse (!) 119, temperature 99.1 F (37.3 C), temperature source Oral, resp. rate 18, SpO2 100%. There is no height or weight on file to calculate BMI.  Musculoskeletal: Strength & Muscle Tone: within normal limits Gait & Station: normal Patient leans: N/A   [facility] MSE Discharge Disposition for Follow up and Recommendations: Based on my evaluation I certify that psychiatric inpatient services furnished can reasonably be expected to improve the patient's condition which I recommend transfer to an appropriate accepting facility.   Recommending inpatient hospitalization for treatment and stabilization of mental status. Robet Chiquito, NP 02/15/2024, 7:01 PM

## 2024-02-15 NOTE — BH Assessment (Signed)
 Comprehensive Clinical Assessment (CCA) Note   02/15/2024 Dylan Moon [id number]  Disposition: Dylan Chiquito, NP recommends inpatient hospitalization.   The patient demonstrates the following risk factors for suicide: Chronic risk factors for suicide include: psychiatric disorder of Bipolar and Anxiety Disorder . Acute risk factors for suicide include: family or marital conflict. Protective factors for this patient include: positive social support. Considering these factors, the overall suicide risk at this point appears to be low. Patient is not appropriate for outpatient follow up.   Pt presents to [facility] under an IVC. Per IVC," pt is dianognsed with bipolar and anxiey, not taking care of hyigene, talking about killing himself to his mother, paranoid, drinking alcohol daiy and smoking marijuana often." Pt reports he is unsure why he is at [facility]. Pt denies SI, HI, and AVH. Pt reports that he is compliant with medications. Pt denies substance and etoh use. Per chart, pt has hx of aggressive behavior towards [facility] staff.  Upon evaluation with this clinician, the patient is alert, oriented x 3, and cooperative. Speech is clear, coherent and logical. Pt appears casual. Eye contact is fair. Mood is anxious and affect is congruent with mood. The thought process is logical and thought content is coherent. Pt reports his mother thinks he is suicidal but reports that he has no desire to harm himself. Pt denies SI/HI/AVH. There is no indication that the patient is responding to internal stimuli. No delusions elicited during this assessment.       Chief Complaint: IVC   Visit Diagnosis:  Bipolar Disorder     CCA Screening, Triage and Referral (STR)  Patient Reported Information How did you hear about us ? Legal System  What Is the Reason for Your Visit/Call Today? Pt presents to [facility] under an IVC. Per IVC," pt is dianognsed with bipolar and anxiey, not taking care of hyigene, talking about  killing himself to his mother, paranoid, drinking alcohol daiy and smoking marijuana often." Pt reports he is unsure why he is at [facility]. Pt denies SI, HI, and AVH. Pt reports that he is compliant with medications. Pt denies substance and etoh use. Per chart, pt has hx of aggressive behavior towards [facility] staff.  How Long Has This Been Causing You Problems? > than 6 months  What Do You Feel Would Help You the Most Today? Treatment for Depression or other mood problem; Stress Management; Medication(s)   Have You Recently Had Any Thoughts About Hurting Yourself? No  Are You Planning to Commit Suicide/Harm Yourself At This time? No   Flowsheet Row ED from 02/15/2024 in [facility] ED from 11/15/2023 in [facility] Emergency Department at [facility] Video Visit from 10/21/2023 in [facility]  C-SSRS RISK CATEGORY No Risk No Risk No Risk       Have you Recently Had Thoughts About Hurting Someone Dylan Moon? No  Are You Planning to Harm Someone at This Time? No  Explanation: No HI   Have You Used Any Alcohol or Drugs in the Past 24 Hours? No  How Long Ago Did You Use Drugs or Alcohol? N/A What Did You Use and How Much? N/A  Do You Currently Have a Therapist/Psychiatrist? Yes  Name of Therapist/Psychiatrist: Name of Therapist/Psychiatrist: Per chart, Pt is active with ACTT team   Have You Been Recently Discharged From Any Office Practice or Programs? No  Explanation of Discharge From Practice/Program: N/A    CCA Screening Triage Referral Assessment Type of Contact: Face-to-Face  Telemedicine Service Delivery:   Is this Initial or Reassessment?   Date Telepsych consult ordered in [facility]:    Time Telepsych consult ordered in [facility]:    Location of Assessment: [facility] Assessment Services  Provider Location: [facility] Assessment Services   Collateral Involvement: None   Does Patient Have a Automotive engineer Guardian?  No  Legal Guardian Contact Information: n/a  Copy of Legal Guardianship Form: -- (n/a)  Legal Guardian Notified of Arrival: -- (n/a)  Legal Guardian Notified of Pending Discharge: -- (n/a)  If Minor and Not Living with Parent(s), Who has Custody? n/a  Is CPS involved or ever been involved? Never  Is APS involved or ever been involved? Never   Patient Determined To Be At Risk for Harm To Self or Others Based on Review of Patient Reported Information or Presenting Complaint? No  Method: No Plan  Availability of Means: No access or NA  Intent: Vague intent or NA  Notification Required: No need or identified person  Additional Information for Danger to Others Potential: -- (n/a)  Additional Comments for Danger to Others Potential: Pt denies HI  Are There Guns or Other Weapons in Your Home? No  Types of Guns/Weapons: Pt denies guns/weapons  Are These Weapons Safely Secured?                            No  Who Could Verify You Are Able To Have These Secured: Denies guns/ weapons  Do You Have any Outstanding Charges, Pending Court Dates, Parole/Probation? Pt denies pending legal charges  Contacted To Inform of Risk of Harm To Self or Others: Other: Comment (n/a)    Does Patient Present under Involuntary Commitment? Yes    [place] of Residence: [place]   Patient Currently Receiving the Following Services: ACTT Psychologist, educational)   Determination of Need: Routine (7 days)   Options For Referral: Outpatient Therapy; Medication Management     CCA Biopsychosocial Patient Reported Schizophrenia/Schizoaffective Diagnosis in Past: No   Strengths: Pt able to express his feelings and emotions   Mental Health Symptoms Depression:  None   Duration of Depressive symptoms:    Mania:  None   Anxiety:   Restlessness; Irritability   Psychosis:  None   Duration of Psychotic symptoms:    Trauma:  None   Obsessions:  None   Compulsions:  None    Inattention:  None   Hyperactivity/Impulsivity:  None   Oppositional/Defiant Behaviors:  Temper; Argumentative   Emotional Irregularity:  None   Other Mood/Personality Symptoms:  none    Mental Status Exam Appearance and self-care  Stature:  Average   Weight:  Average weight   Clothing:  Casual   Grooming:  Normal   Cosmetic use:  None   Posture/gait:  Normal   Motor activity: Normal  Sensorium  Attention:  Normal   Concentration:  Normal   Orientation:  X5   Recall/memory:  Normal   Affect and Mood  Affect:  Anxious   Mood:  Anxious   Relating  Eye contact:  Normal   Facial expression:  Anxious   Attitude toward examiner:  Cooperative   Thought and Language  Speech flow: Clear and Coherent   Thought content:  Appropriate to Mood and Circumstances   Preoccupation:  None   Hallucinations:  None   Organization:  Coherent   [employer] of Knowledge:  Average   Intelligence:  Average  Abstraction:  Normal   Judgement:  Fair   Dance movement psychotherapist:  Realistic   Insight:  Fair   Decision Making:  Impulsive   Social Functioning  Social Maturity:  Impulsive   Social Judgement:  Impropriety   Stress  Stressors:  Family conflict   Coping Ability:  Exhausted; Overwhelmed   Skill Deficits:  Building services engineer; Self-control   Supports:  Family     Religion: Religion/Spirituality Are You A Religious Person?: No  Leisure/Recreation: Leisure / Recreation Do You Have Hobbies?: No  Exercise/Diet: Exercise/Diet Do You Exercise?: No Have You Gained or Lost A Significant Amount of Weight in the Past Six Months?: No Do You Follow a Special Diet?: No Do You Have Any Trouble Sleeping?: No   CCA Employment/Education Employment/Work Situation: Employment / Work Psychologist, occupational Employment Situation: 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?: No  Education: Education Is Patient Currently Attending School?: No Last Grade Completed: 12 Did You Product manager?: No Did You Have An Individualized Education Program (IIEP): No Did You Have Any Difficulty At [employer]?: No Patient's Education Has Been Impacted by Current Illness: No   CCA Family/Childhood History Family and Relationship History: Family history Marital status: Single Does patient have children?: Yes How many children?:  (UTA) How is patient's relationship with their children?: UTA  Childhood History:  Childhood History By whom was/is the patient raised?: Mother Did patient suffer any verbal/emotional/physical/sexual abuse as a child?: No Did patient suffer from severe childhood neglect?: No Has patient ever been sexually abused/assaulted/raped as an adolescent or adult?: No Was the patient ever a victim of a crime or a disaster?: No Witnessed domestic violence?: No Has patient been affected by domestic violence as an adult?: No       CCA Substance Use Alcohol/Drug Use: Alcohol / Drug Use Pain Medications: see MAR Prescriptions: see MAR Over the Counter: see MAR History of alcohol / drug use?: No history of alcohol / drug abuse Longest period of sobriety (when/how long): n/a Pt denies ETOH and Drug use Negative Consequences of Use:  (n/a) Withdrawal Symptoms:  (n/a)                         ASAM's:  Six Dimensions of Multidimensional Assessment  Dimension 1:  Acute Intoxication and/or Withdrawal Potential:      Dimension 2:  Biomedical Conditions and Complications:      Dimension 3:  Emotional, Behavioral, or Cognitive Conditions and Complications:     Dimension 4:  Readiness to Change:     Dimension 5:  Relapse, Continued use, or Continued Problem Potential:     Dimension 6:  Recovery/Living Environment:     ASAM Severity Score:    ASAM Recommended Level of Treatment: ASAM  Recommended Level of Treatment:  (n/a)   Substance use Disorder (SUD) Substance Use Disorder (SUD)  Checklist Symptoms of Substance Use:  (n/a)  Recommendations for Services/Supports/Treatments: Recommendations for Services/Supports/Treatments Recommendations For Services/Supports/Treatments: Inpatient Hospitalization  Disposition Recommendation per psychiatric provider: We recommend inpatient psychiatric hospitalization after medical hospitalization. Patient has been involuntarily committed on 02/15/24.    DSM5 Diagnoses: Patient Active Problem List   Diagnosis Date Noted   Hallucinations 09/04/2023   Increased ammonia level 08/06/2023   Bipolar affective disorder, current episode manic with psychotic symptoms (HCC) 07/16/2023   Bipolar I disorder, severe, current or  most recent episode manic, with psychotic features (HCC) 01/28/2023   Delta-9-tetrahydrocannabinol (THC) dependence (HCC) 01/28/2023   GAD (generalized anxiety disorder) 01/28/2023   Insomnia 01/28/2023   Bipolar disorder (HCC) 12/04/2022   Agitation 12/03/2022   Suicidal behavior with attempted self-injury (HCC) 11/26/2022   Suicidal ideation 11/26/2022   Unspecified intellectual disabilities 08/09/2020   Family discord 03/15/2020   Aggressive behavior 03/15/2020   HCAP (healthcare-associated pneumonia) 12/27/2019   Status epilepticus (HCC) 12/24/2019   Acute respiratory failure with hypoxemia (HCC)    Seizures (HCC) 07/10/2018   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

## 2024-02-15 NOTE — ED Notes (Signed)
 IVC by mother d.t decline in mental health, not sleeping or caring for self. IVC indicates pt expressing SI to mother.  Pt denied current SI plan and intent.   Pt stated he is here in obs because his mother wanted him to come.  Denies and complaints or problems at present. Calm and cooperative Hourly observations for safety continue

## 2024-02-16 ENCOUNTER — Other Ambulatory Visit: Payer: Self-pay

## 2024-02-16 ENCOUNTER — Inpatient Hospital Stay (HOSPITAL_COMMUNITY)
Admission: AD | Admit: 2024-02-16 | Discharge: 2024-02-21 | DRG: 885 | Disposition: A | Discharge status: Home / Self Care | Payer: MEDICAID | Source: Intra-hospital | Attending: Psychiatry | Admitting: Psychiatry

## 2024-02-16 ENCOUNTER — Encounter (HOSPITAL_COMMUNITY): Payer: Self-pay

## 2024-02-16 ENCOUNTER — Encounter (HOSPITAL_COMMUNITY): Payer: Self-pay | Admitting: Nurse Practitioner

## 2024-02-16 DIAGNOSIS — F259 Schizoaffective disorder, unspecified: Secondary | ICD-10-CM | POA: Diagnosis present

## 2024-02-16 DIAGNOSIS — F909 Attention-deficit hyperactivity disorder, unspecified type: Secondary | ICD-10-CM | POA: Diagnosis present

## 2024-02-16 DIAGNOSIS — M419 Scoliosis, unspecified: Secondary | ICD-10-CM | POA: Diagnosis present

## 2024-02-16 DIAGNOSIS — F79 Unspecified intellectual disabilities: Secondary | ICD-10-CM | POA: Diagnosis present

## 2024-02-16 DIAGNOSIS — Z79899 Other long term (current) drug therapy: Secondary | ICD-10-CM | POA: Diagnosis not present

## 2024-02-16 DIAGNOSIS — Z91148 Patient's other noncompliance with medication regimen for other reason: Secondary | ICD-10-CM

## 2024-02-16 DIAGNOSIS — F250 Schizoaffective disorder, bipolar type: Principal | ICD-10-CM | POA: Diagnosis present

## 2024-02-16 DIAGNOSIS — F1220 Cannabis dependence, uncomplicated: Secondary | ICD-10-CM | POA: Diagnosis present

## 2024-02-16 DIAGNOSIS — Z888 Allergy status to other drugs, medicaments and biological substances status: Secondary | ICD-10-CM | POA: Diagnosis not present

## 2024-02-16 DIAGNOSIS — Z885 Allergy status to narcotic agent status: Secondary | ICD-10-CM | POA: Diagnosis not present

## 2024-02-16 DIAGNOSIS — F411 Generalized anxiety disorder: Secondary | ICD-10-CM | POA: Diagnosis present

## 2024-02-16 DIAGNOSIS — R45851 Suicidal ideations: Secondary | ICD-10-CM | POA: Diagnosis present

## 2024-02-16 DIAGNOSIS — F630 Pathological gambling: Secondary | ICD-10-CM | POA: Diagnosis present

## 2024-02-16 DIAGNOSIS — Z833 Family history of diabetes mellitus: Secondary | ICD-10-CM | POA: Diagnosis not present

## 2024-02-16 DIAGNOSIS — G40909 Epilepsy, unspecified, not intractable, without status epilepticus: Secondary | ICD-10-CM | POA: Diagnosis present

## 2024-02-16 DIAGNOSIS — Z8249 Family history of ischemic heart disease and other diseases of the circulatory system: Secondary | ICD-10-CM

## 2024-02-16 LAB — FOLATE: Folate: 6.5 ng/mL (ref >5.9)

## 2024-02-16 LAB — VITAMIN D 25 HYDROXY (VIT D DEFICIENCY, FRACTURES): Vit D, 25-Hydroxy: 54.47 ng/mL (ref 30–100)

## 2024-02-16 LAB — VITAMIN B12: Vitamin B-12: 440 pg/mL (ref 180–914)

## 2024-02-16 MED ORDER — DIPHENHYDRAMINE HCL 50 MG/ML IJ SOLN: 50.0000 mg | Freq: Three times a day (TID) | INTRAMUSCULAR | Status: DC | PRN

## 2024-02-16 MED ORDER — MAGNESIUM HYDROXIDE 400 MG/5ML PO SUSP: 30.0000 mL | Freq: Every day | ORAL | Status: DC | PRN

## 2024-02-16 MED ORDER — HALOPERIDOL LACTATE 5 MG/ML IJ SOLN: 10.0000 mg | Freq: Three times a day (TID) | INTRAMUSCULAR | Status: DC | PRN

## 2024-02-16 MED ORDER — LORAZEPAM 2 MG/ML IJ SOLN: 2.0000 mg | Freq: Three times a day (TID) | INTRAMUSCULAR | Status: DC | PRN

## 2024-02-16 MED ORDER — HYDROXYZINE HCL 25 MG PO TABS: 25.0000 mg | ORAL_TABLET | Freq: Three times a day (TID) | ORAL | Status: DC | PRN

## 2024-02-16 MED ORDER — ESCITALOPRAM OXALATE 10 MG PO TABS
10.0000 mg | ORAL_TABLET | Freq: Every day | ORAL | Status: DC
  Administered 2024-02-16: 10 mg via ORAL
  Filled 2024-02-16: qty 1

## 2024-02-16 MED ORDER — HALOPERIDOL 5 MG PO TABS: 5.0000 mg | ORAL_TABLET | Freq: Three times a day (TID) | ORAL | Status: DC | PRN

## 2024-02-16 MED ORDER — ESLICARBAZEPINE ACETATE 800 MG PO TABS
1600.0000 mg | ORAL_TABLET | Freq: Every day | ORAL | Status: DC
  Administered 2024-02-17 – 2024-02-21 (×5): 1600 mg via ORAL

## 2024-02-16 MED ORDER — ACETAMINOPHEN 325 MG PO TABS: 650.0000 mg | ORAL_TABLET | Freq: Four times a day (QID) | ORAL | Status: DC | PRN

## 2024-02-16 MED ORDER — ALUM & MAG HYDROXIDE-SIMETH 200-200-20 MG/5ML PO SUSP: 30.0000 mL | ORAL | Status: DC | PRN

## 2024-02-16 MED ORDER — CARVEDILOL 12.5 MG PO TABS
25.0000 mg | ORAL_TABLET | Freq: Two times a day (BID) | ORAL | Status: DC
  Administered 2024-02-16 – 2024-02-18 (×4): 25 mg via ORAL
  Filled 2024-02-16 (×5): qty 2

## 2024-02-16 MED ORDER — HALOPERIDOL LACTATE 5 MG/ML IJ SOLN: 5.0000 mg | Freq: Three times a day (TID) | INTRAMUSCULAR | Status: DC | PRN

## 2024-02-16 MED ORDER — DIPHENHYDRAMINE HCL 50 MG/ML IJ SOLN
50.0000 mg | Freq: Three times a day (TID) | INTRAMUSCULAR | Status: DC | PRN
Start: 2024-02-16 — End: 2024-02-21

## 2024-02-16 MED ORDER — DIVALPROEX SODIUM 250 MG PO DR TAB
750.0000 mg | DELAYED_RELEASE_TABLET | Freq: Two times a day (BID) | ORAL | Status: DC
  Administered 2024-02-16 – 2024-02-21 (×11): 750 mg via ORAL
  Filled 2024-02-16 (×11): qty 3

## 2024-02-16 MED ORDER — TRAZODONE HCL 50 MG PO TABS
50.0000 mg | ORAL_TABLET | Freq: Every evening | ORAL | Status: DC | PRN
  Administered 2024-02-16 – 2024-02-17 (×2): 50 mg via ORAL
  Filled 2024-02-16 (×2): qty 1

## 2024-02-16 MED ORDER — DIPHENHYDRAMINE HCL 25 MG PO CAPS: 50.0000 mg | ORAL_CAPSULE | Freq: Three times a day (TID) | ORAL | Status: DC | PRN

## 2024-02-16 MED ORDER — ZONISAMIDE 100 MG PO CAPS
300.0000 mg | ORAL_CAPSULE | Freq: Every day | ORAL | Status: DC
  Administered 2024-02-16 – 2024-02-21 (×6): 300 mg via ORAL
  Filled 2024-02-16 (×6): qty 3

## 2024-02-16 MED ORDER — PALIPERIDONE ER 3 MG PO TB24
3.0000 mg | ORAL_TABLET | Freq: Every day | ORAL | Status: DC
  Administered 2024-02-16 – 2024-02-21 (×6): 3 mg via ORAL
  Filled 2024-02-16 (×6): qty 1

## 2024-02-16 MED ORDER — BENZTROPINE MESYLATE 0.5 MG PO TABS
0.5000 mg | ORAL_TABLET | Freq: Two times a day (BID) | ORAL | Status: DC
  Administered 2024-02-16 – 2024-02-17 (×3): 0.5 mg via ORAL
  Filled 2024-02-16 (×3): qty 1

## 2024-02-16 MED ORDER — PALIPERIDONE PALMITATE ER 234 MG/1.5ML IM SUSY
234.0000 mg | PREFILLED_SYRINGE | Freq: Once | INTRAMUSCULAR | Status: AC
  Administered 2024-02-16: 234 mg via INTRAMUSCULAR

## 2024-02-16 NOTE — Progress Notes (Signed)
 Pt admitted to the adult Unit on 02/16/24 at 0800 to room 506-1. Pt has a hx of Schizoaffective DO. IVC by his mother. Presented to [facility] via [employer] due to concerns of paranoia and SI. Pt denies SI, HI,and AVH. Pt states that this is a misunderstanding with his mother. Skin assessment completed. Admission completed and consents signed. Nourishment provided. Pt oriented to the unit.

## 2024-02-16 NOTE — Progress Notes (Signed)
 Recreation Therapy Notes  Patient admitted to unit 5.26.25. Due to admission within last year, no new recreation therapy assessment conducted at this time. Last assessment conducted on 12.13.24.    Reason for current admission per patient, "I told my mom to send me some money so I could make some money for myself but she thought I said I wanted to kill myself".  Patient reports no changes in stressors from previous admission.  Patient reports goal of "get discharged".  Patient coping skills, leisure interests, strengths and areas of improvement remain the same from last admission.   Patient denies SI, HI, AVH at this time.    Information found below from assessment conducted 12.13.24.  Coping Skills: TV, Sports, Music, Exercise, Meditate, Deep Breathing, Talk, Art, Prayer, Avoidance, Read, Hot Bath/Shower  Leisure Interests: Photography, Edit Music Videos  Strengths: Photography, Video graphy, Church  Areas of Improvement: Going to Bible Study      Dylan Moon, LRT,CTRS Dylan Moon 02/16/2024 3:14 PM

## 2024-02-16 NOTE — ED Notes (Signed)
 Report called to RN Jerryl Morin. And [employer] also called for disposition. Patient assessments remains unchanged.

## 2024-02-16 NOTE — Group Note (Signed)
 Date:  02/16/2024 Time:  8:16 PM  Group Topic/Focus:  Wrap-Up Group:   The focus of this group is to help patients review their daily goal of treatment and discuss progress on daily workbooks.    Participation Level:  Active  Participation Quality:  Sharing  Affect:  Appropriate  Cognitive:  Appropriate  Insight: Good  Engagement in Group:  Engaged  Modes of Intervention:  Discussion  Additional Comments:    Ferd Householder Erinn Huskins 02/16/2024, 8:16 PM

## 2024-02-16 NOTE — H&P (Signed)
 Psychiatric Admission Assessment Adult  Patient Identification: Dylan Moon  MRN:  [id number]  Date of Evaluation:  02/16/24  Chief Complaint:  Schizoaffective disorder (HCC) [F25.9]   Principal Diagnosis: Schizoaffective disorder (HCC)  Diagnosis:  Principal Problem:   Schizoaffective disorder [facility])    Chief Complaint: "I was talking on the phone with my mom..."   History of Present Illness: Dylan Moon is a 28 y.o. who  has a past medical history of ADHD, Bipolar 1 disorder (HCC), Schizoaffective disorder (HCC), Scoliosis, and Seizures (HCC).  He presented to [facility] for decompensated Schizoaffective disorder (HCC) in the setting of medication noncompliance.  Per nurse practitioner Remigio Carl, "Dylan Moon is a 28 y.o. AA male with a prior mental health diagnosis of Schizoaffective d/o who presented today to the [facility] behavioral health center via [facility] due to concerns for paranoia and suicidal ideations. As per IVC petition initiated by pt's mother Dylan Moon): " [name] has been diagnosed with being bipolar and with anxiety. [name] is not taking care of his personal hygiene and is not sleeping [name] thinks someone is outside to get him on social media and thinks people are watching him from outside through the window [name] is throwing things and trashing the house [name] is drinking alcohol every day and smokes marijuana often [name] was committed that Jan 2025."   Assessment: Patient is assessed by Clinical research associate who is familiar with pt from his prior hospitalization at the [facility] behavioral health hospital. Pt states that his mother called the police because she thought that he made suicidal remarks, but denies that he made the remarks. He is calm during assessment, denies SI/HI/AVH. Denies any prior suicide attempts.   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 Pt denies any other substance use, other than THC. States that his doctor told him to cut down to two blunts daily and that this is what he cut down to. He denies ETOH use, denies use of any other recreational substance.   Pt shares that he has been taking his medications as prescribed. Denies most depressive symptoms, but admits to decreased interest in doing things which make him happy, states that his is stressed because of the recent death of his Dylan Moon who was 29 yrs old.   Collateral information: Call placed to petitioner: Dylan Moon (Mother) [phone] (Mobile)-writer called 3 times, and call was not answered.  Writer proceeded to calling the ACT team, PSI, there was also no anxiety.  Writer called patient's girlfriend with whom he resides at  [phone] -Dylan Moon,Dylan Moon-there was no answer, initially, but she was able to call back, and provided collateral.She stated that pt has been more paranoid than usual even though he has some paranoia at baseline; she shares that pt feels as though people are watching him, she states that they live in an apartment and have dogs, and whenever the dogs bark, patient feels as though people are coming to get him.  Reports that typically, patient does not feel this way when the dogs are barking.  She reports that patient has been getting more agitated, "moody" lately, reports that prior to this presentation, he left their apartment when she told him that she would call his mother to let her know that he was not doing so well. She shares that the police found him at a gas station parking lot  prior to bringing him in. GF shares that pt has been smoking THC, even though he has significantly cut back on it  and currently only smokes a blunt per day.   First examination completed, and based on reasons for presentation, pt's mental health background, information obtained from GF as well as face to face assessment by writer during which thoughts are currently impaired, we will uphold IVC. Patient currently presents as a moderate risk of danger to self.    Suicide Risk assessment Moderate:   Denies SI at this time, has a mental illness, with some risk factors for suicide present, and identifiable protective factors, including available and accessible social support. There are concerns that if not treated, current symptoms will worsen.    -Recommendations made for inpatient behavioral health hospitalization for treatment and stabilization of mental status.   -Ordered baseline labs, EKG, agitation protocol -Patient has been accepted to the [facility] behavioral health hospital and we are awaiting labs. -Restarted Home meds Started Clonidine  0.1 mg for SBP>160 or DBP>100"  On my exam, the patient is noted to be a poor and unreliable historian.  He expresses grandiose delusions about working with celebrities, being a celebrity himself, being world's Adult nurse, etc.  He provides a history that is grossly inconsistent with chart review and with his lab results.  He appears to be in a decompensated state of schizoaffective disorder.  He is amenable to restarting medications including his Invega  Sustenna injection.  Will plan on restarting oral Invega  and initiating Invega  Sustenna.   Past Psychiatric History: He  has a past medical history of ADHD, Bipolar 1 disorder (HCC), Schizoaffective disorder (HCC), Scoliosis, and Seizures (HCC).   Is the patient at risk to self?  Yes Has the patient been a risk to self in the past 6 months? No Has the patient been a risk to self within the distant past?  No Is the patient a risk to others? No Has the patient been a risk to others in the past 6 months? No Has the patient been a risk to others within the distant past? No  [place] Scale:  Flowsheet Row [facility] (Current) [facility] from [facility]15/2024 in [facility] ED from 11/15/2023 in [facility] Department [facility]  C-SSRS RISK CATEGORY No Risk No Risk No Risk          Prior Inpatient Therapy: Yes Prior Outpatient Therapy: Yes  Alcohol Screening:  1. How often do you have a drink containing alcohol?: Monthly or less 2. How many drinks containing alcohol do you have on a typical day when you are drinking?: 1 or 2 3. How often do you have six or more drinks on one occasion?: Never AUDIT-C Score: 1 4. How often during the last year have you found that you were not able to stop drinking once you had started?: Never 5. How often during the last year have you failed to do what was normally expected from you because of drinking?: Never 6. How often during the last year have you needed a first drink in the morning to get yourself  going after a heavy drinking session?: Never 7. How often during the last year have you had a feeling of guilt of remorse after drinking?: Never 8. How often during the last year have you been unable to remember what happened the night before because you had been drinking?: Never 9. Have you or someone else been injured as a result of your drinking?: No 10. Has a relative or friend or a doctor or another health worker been concerned about your drinking or suggested you cut down?: No Alcohol Use Disorder Identification Test Final Score (AUDIT): 1 Alcohol Brief Interventions/Follow-up: Patient Refused  Substance Abuse History in the last 12 months: Yes Consequences of Substance Abuse: NA  Previous Psychotropic Medications: Yes Psychological Evaluations:  No  Past Medical History:  Past Medical History:  Diagnosis Date   ADHD    Bipolar 1 disorder (HCC)    Schizoaffective disorder (HCC)    Scoliosis    Seizures (HCC)    most recent 12/02/17     Family Psychiatric & Medical History:  Family History  Problem Relation Age of Onset   Diabetes Mother    Hypertension Mother    Cancer Other    Diabetes Moon    Seizures Maternal Grandfather      Tobacco Screening:  Social History   Tobacco Use  Smoking Status Some Days   Types: Cigars  Smokeless Tobacco Never      Social History:  Social History   Substance and Sexual Activity  Alcohol Use Not Currently      Additional Social History:       Allergies:   Allergies  Allergen Reactions   Keppra  [Levetiracetam ] Other (See Comments)    Irritability, mental status changes   Tramadol  Other (See Comments)    Mixed with his other meds can cause seizures     Vimpat  [Lacosamide ] Other (See Comments)    Causes anger, mental status changes     Lab Results:  Results for orders placed or performed during the hospital encounter of 02/15/24 (from the past 48 hours)  CBC with Differential/Platelet     Status: None   Collection Time: 02/15/24  5:45 PM  Result Value Ref Range   WBC 5.5 4.0 - 10.5 K/uL   RBC 5.25 4.22 - 5.81 MIL/uL   Hemoglobin 14.9 13.0 - 17.0 g/dL   HCT 40.9 81.1 - 91.4 %   MCV 84.8 80.0 - 100.0 fL   MCH 28.4 26.0 - 34.0 pg   MCHC 33.5 30.0 - 36.0 g/dL   RDW 78.2 95.6 - 21.3 %   Platelets 257 150 - 400 K/uL   nRBC 0.0 0.0 - 0.2 %   Neutrophils Relative % 60 %   Neutro Abs 3.3 1.7 - 7.7 K/uL   Lymphocytes Relative 33 %   Lymphs Abs 1.8 0.7 - 4.0 K/uL   Monocytes Relative 7 %   Monocytes Absolute 0.4 0.1 - 1.0 K/uL   Eosinophils Relative 0 %   Eosinophils Absolute 0.0 0.0 - 0.5 K/uL   Basophils Relative 0 %   Basophils Absolute 0.0 0.0 - 0.1 K/uL   Immature Granulocytes 0 %   Abs Immature Granulocytes 0.02 0.00 - 0.07 K/uL    Comment: Performed  at [facility], [address], [place], [place] [address]  Comprehensive metabolic panel     Status: Abnormal   Collection Time: 02/15/24  5:45 PM  Result Value Ref Range   Sodium 137 135 - 145 mmol/L   Potassium  4.1 3.5 - 5.1 mmol/L   Chloride 103 98 - 111 mmol/L   CO2 22 22 - 32 mmol/L   Glucose, Bld 74 70 - 99 mg/dL    Comment: Glucose reference range applies only to samples taken after fasting for at least 8 hours.   BUN 14 6 - 20 mg/dL   Creatinine, Ser 1.61 0.61 - 1.24 mg/dL   Calcium  9.7 8.9 - 10.3 mg/dL   Total Protein 7.5 6.5 - 8.1 g/dL   Albumin 4.6 3.5 - 5.0 g/dL   AST 15 15 - 41 U/L   ALT 14 0 - 44 U/L   Alkaline Phosphatase 37 (L) 38 - 126 U/L   Total Bilirubin 0.8 0.0 - 1.2 mg/dL   GFR, Estimated >09 >60 mL/min    Comment: (NOTE) Calculated using the CKD-EPI Creatinine Equation (2021)    Anion gap 12 5 - 15    Comment: Performed at [facility], [address] [place], [place] [address]  Hemoglobin A1c     Status: None   Collection Time: 02/15/24  5:45 PM  Result Value Ref Range   Hgb A1c MFr Bld 5.2 4.8 - 5.6 %    Comment: (NOTE) Pre diabetes:          5.7%-6.4%  Diabetes:              >6.4%  Glycemic control for   <7.0% adults with diabetes    Mean Plasma Glucose 102.54 mg/dL    Comment: Performed at [facility], [address], [place], [place] [address]  Ethanol     Status: None   Collection Time: 02/15/24  5:45 PM  Result Value Ref Range   Alcohol, Ethyl (B) <15 <15 mg/dL    Comment: (NOTE) For medical purposes only. Performed at [facility], [address], [place], [place] [address]   Lipid panel     Status: Abnormal   Collection Time: 02/15/24  5:45 PM  Result Value Ref Range   Cholesterol 198 0 - 200 mg/dL   Triglycerides 83 <562 mg/dL   HDL 47 >13 mg/dL   Total CHOL/HDL Ratio 4.2 RATIO   VLDL 17 0 - 40 mg/dL   LDL Cholesterol 086 (H) 0 - 99 mg/dL    Comment:        Total Cholesterol/HDL:CHD Risk Coronary  Heart Disease Risk Table                     Men   Women  1/2 Average Risk   3.4   3.3  Average Risk       5.0   4.4  2 X Average Risk   9.6   7.1  3 X Average Risk  23.4   11.0        Use the calculated Patient Ratio above and the CHD Risk Table to determine the patient's CHD Risk.        ATP III CLASSIFICATION (LDL):  <100     mg/dL   Optimal  578-469  mg/dL   Near or Above                    Optimal  130-159  mg/dL   Borderline  629-528  mg/dL   High  >413     mg/dL   Very High Performed at [facility], [address] [place], [place] [address]   TSH     Status: None  Collection Time: 02/15/24  5:45 PM  Result Value Ref Range   TSH 0.562 0.350 - 4.500 uIU/mL    Comment: Performed by a 3rd Generation assay with a functional sensitivity of <=0.01 uIU/mL. Performed at [facility], [address] [place], [place] [address]   Valproic  acid level     Status: Abnormal   Collection Time: 02/15/24  5:45 PM  Result Value Ref Range   Valproic  Acid Lvl 47 (L) 50 - 100 ug/mL    Comment: Performed at [facility], [address] [place], [place] [address]  POCT Urine Drug Screen - (I-Screen)     Status: Abnormal   Collection Time: 02/15/24  8:17 PM  Result Value Ref Range   POC Amphetamine UR None Detected NONE DETECTED (Cut Off Level 1000 ng/mL)   POC Secobarbital (BAR) None Detected NONE DETECTED (Cut Off Level 300 ng/mL)   POC Buprenorphine (BUP) None Detected NONE DETECTED (Cut Off Level 10 ng/mL)   POC Oxazepam (BZO) None Detected NONE DETECTED (Cut Off Level 300 ng/mL)   POC Cocaine UR None Detected NONE DETECTED (Cut Off Level 300 ng/mL)   POC Methamphetamine UR None Detected NONE DETECTED (Cut Off Level 1000 ng/mL)   POC Morphine None Detected NONE DETECTED (Cut Off Level 300 ng/mL)   POC Methadone UR None Detected NONE DETECTED (Cut Off Level 300 ng/mL)   POC Oxycodone UR None Detected NONE DETECTED (Cut Off Level 100 ng/mL)   POC Marijuana UR  Positive (A) NONE DETECTED (Cut Off Level 50 ng/mL)     Blood Alcohol level:  Lab Results  Component Value Date   [facility] <15 02/15/2024   ETH <10 09/03/2023    Metabolic Disorder Labs:  Lab Results  Component Value Date   HGBA1C 5.2 02/15/2024   MPG 102.54 02/15/2024   MPG 114.02 07/17/2023   Lab Results  Component Value Date   PROLACTIN 40.3 (H) 12/06/2022    Lab Results  Component Value Date   CHOL 198 02/15/2024   TRIG 83 02/15/2024   HDL 47 02/15/2024   VLDL 17 02/15/2024   LDLCALC 134 (H) 02/15/2024   LDLCALC 101 (H) 07/17/2023      Current Medications: Current Facility-Administered Medications  Medication Dose Route Frequency Provider Last Rate Last Admin   acetaminophen  (TYLENOL ) tablet 650 mg  650 mg Oral Q6H PRN Bobbitt, Shalon E, NP       alum & mag hydroxide-simeth (MAALOX/MYLANTA) 200-200-20 MG/5ML suspension 30 mL  30 mL Oral Q4H PRN Bobbitt, Shalon E, NP       benztropine  (COGENTIN ) tablet 0.5 mg  0.5 mg Oral BID Bobbitt, Shalon E, NP   0.5 mg at 02/16/24 1019   carvedilol  (COREG ) tablet 25 mg  25 mg Oral BID WC Bobbitt, Shalon E, NP   25 mg at 02/16/24 1019   haloperidol  (HALDOL ) tablet 5 mg  5 mg Oral TID PRN Bobbitt, Shalon E, NP       And   diphenhydrAMINE  (BENADRYL ) capsule 50 mg  50 mg Oral TID PRN Bobbitt, Shalon E, NP       haloperidol  lactate (HALDOL ) injection 5 mg  5 mg Intramuscular TID PRN Bobbitt, Shalon E, NP       And   diphenhydrAMINE  (BENADRYL ) injection 50 mg  50 mg Intramuscular TID PRN Bobbitt, Shalon E, NP       And   LORazepam  (ATIVAN ) injection 2 mg  2 mg Intramuscular TID PRN Bobbitt, Shalon E, NP  haloperidol  lactate (HALDOL ) injection 10 mg  10 mg Intramuscular TID PRN Bobbitt, Shalon E, NP       And   diphenhydrAMINE  (BENADRYL ) injection 50 mg  50 mg Intramuscular TID PRN Bobbitt, Shalon E, NP       And   LORazepam  (ATIVAN ) injection 2 mg  2 mg Intramuscular TID PRN Bobbitt, Shalon E, NP       divalproex  (DEPAKOTE ) DR  tablet 750 mg  750 mg Oral Q12H Bobbitt, Shalon E, NP   750 mg at 02/16/24 1019   hydrOXYzine  (ATARAX ) tablet 25 mg  25 mg Oral TID PRN Bobbitt, Shalon E, NP       magnesium  hydroxide (MILK OF MAGNESIA) suspension 30 mL  30 mL Oral Daily PRN Bobbitt, Shalon E, NP       paliperidone  (INVEGA  SUSTENNA) injection 234 mg  234 mg Intramuscular Once Deicy Rusk S, MD       paliperidone  (INVEGA ) 24 hr tablet 3 mg  3 mg Oral Daily Alby Schwabe S, MD       traZODone  (DESYREL ) tablet 50 mg  50 mg Oral QHS PRN Bobbitt, Shalon E, NP        PTA Medications: Medications Prior to Admission  Medication Sig Dispense Refill Last Dose/Taking   benztropine  (COGENTIN ) 0.5 MG tablet Take 1 tablet (0.5 mg total) by mouth 2 (two) times daily. 60 tablet 1    carvedilol  (COREG ) 25 MG tablet Take 1 tablet (25 mg total) by mouth 2 (two) times daily with a meal. 60 tablet 0    divalproex  (DEPAKOTE ) 250 MG DR tablet Take 3 tablets (750 mg total) by mouth every 12 (twelve) hours. 180 tablet 1    ergocalciferol  (VITAMIN D2) 1.25 MG (50000 UT) capsule Take 50,000 Units by mouth every Wednesday.      escitalopram  (LEXAPRO ) 10 MG tablet Take 1 tablet (10 mg total) by mouth daily. 30 tablet 1    Eslicarbazepine Acetate  (APTIOM ) 800 MG TABS Take 1,600 mg by mouth daily. 60 tablet 0    hydrOXYzine  (ATARAX ) 25 MG tablet Take 1 tablet (25 mg total) by mouth 3 (three) times daily as needed for anxiety. 90 tablet 0    nicotine  polacrilex (NICORETTE ) 2 MG gum Take 1 each (2 mg total) by mouth as needed for smoking cessation. 100 tablet 0    paliperidone  (INVEGA  SUSTENNA) 234 MG/1.5ML injection Inject 234 mg into the muscle every 28 (twenty-eight) days. 1.5 mL 3    paliperidone  (INVEGA ) 3 MG 24 hr tablet Take 1 tablet (3 mg total) by mouth daily. 14 tablet 0    promethazine  (PHENERGAN ) 6.25 MG/5ML solution Take 10 mLs (12.5 mg total) by mouth every 6 (six) hours as needed for nausea or vomiting. 120 mL 0    traZODone  (DESYREL ) 50 MG  tablet Take 1 tablet (50 mg total) by mouth at bedtime as needed for sleep. 30 tablet 1    zonisamide  (ZONEGRAN ) 100 MG capsule Take 3 capsules (300 mg total) by mouth daily. 90 capsule 0      Musculoskeletal: Strength & Muscle Tone: within normal limits Gait & Station: normal Patient leans: N/A    Psychiatric Specialty Exam:  Presentation  General Appearance: Disheveled; Bizarre  Eye Contact: Fair  Speech: Garbled  Speech Volume: Decreased  Handedness: Right   Mood and Affect  Mood: Anxious; Euphoric  Affect: Inappropriate   Thought Process  Thought Processes: Disorganized  Descriptions of Associations: Tangential  Orientation: Partial  Thought Content: Illogical; Delusions  History of Schizophrenia/Schizoaffective disorder: Yes  Duration of Psychotic Symptoms: NA Hallucinations: Hallucinations: Auditory  Ideas of Reference: None  Suicidal Thoughts: Suicidal Thoughts: No  Homicidal Thoughts: Homicidal Thoughts: No   Sensorium  Memory: Immediate Good; Recent Good  Judgment: Poor  Insight: Poor   Executive Functions  Concentration: Fair  Attention Span: Fair  Recall: Fair  Fund of Knowledge: Good  Language: Good   Psychomotor Activity  Psychomotor Activity: Psychomotor Activity: Restlessness   Assets  Assets: Communication Skills; Social Support; Housing   Sleep  Sleep: Sleep: Poor Number of Hours of Sleep: 2.5    Physical Exam: General: Sitting comfortably. NAD. HEENT: Normocephalic, atraumatic, MMM, EMOI Lungs: no increased work of breathing noted Heart: no cyanosis Abdomen: Non distended Musculoskeletal: FROM. No obvious deformities Skin: Warm, dry, intact. No rashes noted Neuro: No obvious focal deficits.  Gait and station are normal  Review of Systems  Constitutional: Negative.   HENT: Negative.    Eyes: Negative.   Respiratory: Negative.    Cardiovascular: Negative.   Gastrointestinal: Negative.    Genitourinary: Negative.   Skin: Negative.   Neurological: Negative.   Psychiatric/Behavioral:  Positive for grandiose delusions, paranoia.     Blood pressure 110/70, pulse 61, temperature (!) 97.5 F (36.4 C), temperature source Oral, resp. rate 18, height 5\' 11"  (1.803 m), weight 92.1 kg, SpO2 97%. Body mass index is 28.31 kg/m.   Treatment Plan Summary: ASSESSMENT: Dylan Moon is an 28 y.o. male who  has a past medical history of ADHD, Bipolar 1 disorder (HCC), Schizoaffective disorder (HCC), Scoliosis, and Seizures (HCC).  He presented on 02/16/2024  8:06 AM for decompensated Schizoaffective disorder (HCC), bipolar type in the setting of medication noncompliance.  Will plan on restarting oral Invega  and restarting the Invega  Sustenna injection.  Will discontinue Lexapro  as antidepressants can precipitate and exacerbate mania.  Diagnoses / Active Problems: Patient Active Problem List   Diagnosis Date Noted   Schizoaffective disorder (HCC) 02/16/2024   Hallucinations 09/04/2023   Increased ammonia level 08/06/2023   Bipolar affective disorder, current episode manic with psychotic symptoms (HCC) 07/16/2023   Bipolar I disorder, severe, current or most recent episode manic, with psychotic features (HCC) 01/28/2023   Delta-9-tetrahydrocannabinol (THC) dependence (HCC) 01/28/2023   GAD (generalized anxiety disorder) 01/28/2023   Insomnia 01/28/2023   Bipolar disorder (HCC) 12/04/2022   Agitation 12/03/2022   Suicidal behavior with attempted self-injury (HCC) 11/26/2022   Suicidal ideation 11/26/2022   Unspecified intellectual disabilities 08/09/2020   Family discord 03/15/2020   Aggressive behavior 03/15/2020   HCAP (healthcare-associated pneumonia) 12/27/2019   Status epilepticus (HCC) 12/24/2019   Acute respiratory failure with hypoxemia (HCC)    Seizures (HCC) 07/10/2018   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 Psychiatric Diagnoses and Treatment:  Patient Active Problem List   Diagnosis Date Noted   Schizoaffective disorder (HCC) 02/16/2024   Hallucinations 09/04/2023   Increased ammonia level 08/06/2023  Bipolar affective disorder, current episode manic with psychotic symptoms (HCC) 07/16/2023   Bipolar I disorder, severe, current or most recent episode manic, with psychotic features (HCC) 01/28/2023   Delta-9-tetrahydrocannabinol (THC) dependence (HCC) 01/28/2023   GAD (generalized anxiety disorder) 01/28/2023   Insomnia 01/28/2023   Bipolar disorder (HCC) 12/04/2022   Agitation 12/03/2022   Suicidal behavior with attempted self-injury (HCC) 11/26/2022   Suicidal ideation 11/26/2022   Unspecified intellectual disabilities 08/09/2020   Family discord 03/15/2020   Aggressive behavior 03/15/2020   HCAP (healthcare-associated pneumonia) 12/27/2019   Status epilepticus (HCC) 12/24/2019   Acute respiratory failure with hypoxemia (HCC)    Seizures (HCC) 07/10/2018   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   Noncompliance with medication regimen 12/11/2017   Seizure (HCC) 05/24/2016   Dehydration, mild 05/24/2016   Marijuana use 05/24/2016     Scheduled Medications:  benztropine   0.5 mg Oral BID   carvedilol   25 mg Oral BID WC   divalproex   750 mg Oral Q12H   paliperidone   234 mg Intramuscular Once   paliperidone   3 mg Oral  Daily     As Needed Medications: acetaminophen , alum & mag hydroxide-simeth, haloperidol  **AND** diphenhydrAMINE , haloperidol  lactate **AND** diphenhydrAMINE  **AND** LORazepam , haloperidol  lactate **AND** diphenhydrAMINE  **AND** LORazepam , hydrOXYzine , magnesium  hydroxide, traZODone     3. Medical Issues Being Addressed:    Labs reviewed, unremarkable with the exception of: LDL 134, UDS cannabis positive, intake labs pending    4. Discharge Planning:   -- Social work and case management to assist with discharge planning and identification of hospital follow-up needs prior to discharge  -- Estimated LOS: 7-10 days  -- Discharge Concerns: Need to establish a safety plan; Medication compliance and effectiveness  -- Discharge Goals: Return home with outpatient referrals for mental health follow-up including medication management/psychotherapy  5. Short Term Goals:  Improve ability to identify changes in lifestyle to reduce recurrence of condition, verbalize feelings, disclose and discuss suicidal ideas, demonstrate self-control, identify and develop effective coping behaviors, compliance with prescribed medications, identify triggers associated with substance abuse/mental health issues, participate in unit milieu and in scheduled group therapies   6. Long Term Goals: Improvement in symptoms so the patient is ready for discharge   --The risks/benefits/side-effects/alternatives to the medications above were discussed in detail with the patient and time was given for questions. The patient provided informed consent.   -- Metabolic profile and EKG monitoring obtained while on an atypical antipsychotic and listed in the EHR    Total Time Spent in Direct Patient Care:  I personally spent 60 minutes on the unit in direct patient care. The direct patient care time included face-to-face time with the patient, reviewing the patient's chart, communicating with other professionals, and coordinating care.  Greater than 50% of this time was spent in counseling or coordinating care with the patient regarding goals of hospitalization, psycho-education, and discharge planning needs.   I certify that inpatient services furnished can reasonably be expected to improve the patient's condition.    Clair Crews, MD 02/16/2024, 11:04 AM      Portions of this note were created using voice recognition software. Minor syntax errors, grammatical content, spelling, or punctuation errors may have occurred unintentionally. Please notify the [facility] if the meaning of any statement is unclear.

## 2024-02-16 NOTE — Group Note (Signed)
 Recreation Therapy Group Note   Group Topic:Leisure Education  Group Date: 02/16/2024 Start Time: 1031 End Time: 1109 Facilitators: Deavion Dobbs-McCall, LRT,CTRS Location: [address] Dayroom   Group Topic: Leisure Education   Goal Area(s) Addresses:  Patient will successfully identify positive leisure and recreation activities.  Patient will acknowledge benefits of participation in healthy leisure activities post discharge.  Patient will actively work with peers toward a shared goal.   Behavioral Response:    Intervention: Competitive Group Game   Activity: Guess the [employer]. The game was broken up into 6 categories (Pop, Heil, Ava, Hip-Hop, R&B and Dance). The first team would use the spinner to land one of the music categories. Which ever category the spinner landed on, that group would read the lyrics and opposing team had to fill in the blank. If they answered correctly, they kept the card. The team with the most cards at the end, wins the game. Post-activity discussion reviewed benefits of positive recreation outlets: reducing stress, improving coping mechanisms, increasing self-esteem, and building larger support systems.   Education:  Teacher, English as a foreign language, Stress Management, Discharge Planning  Education Outcome: Acknowledges education/In group clarification offered/Needs additional education   Affect/Mood: N/A   Participation Level: Did not attend    Clinical Observations/Individualized Feedback:     Plan: Continue to engage patient in RT group sessions 2-3x/week.   Tiffancy Moger-McCall, LRT,CTRS 02/16/2024 2:31 PM

## 2024-02-16 NOTE — Plan of Care (Addendum)
 Pt observed to be animated, playful but cooperative with unit routines, engaged in scheduled groups on and off unit. 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 I just did my sister's graduations. 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  Problem: Safety: Goal: Periods of time without injury will increase Outcome: Progressing   Problem: Activity: Goal: Will identify at least one activity in which they can participate Outcome: Progressing   Problem: Nutritional: Goal: Ability to achieve adequate nutritional intake will improve Outcome: Progressing

## 2024-02-16 NOTE — ED Notes (Signed)
 Patient sleeping comfortably in bed. NAD  Respirations are even and unlabored. Will continue to monitor for safety.

## 2024-02-16 NOTE — Progress Notes (Addendum)
   02/16/24 2035  Psych Admission Type (Psych Patients Only)  Admission Status Involuntary  Psychosocial Assessment  Patient Complaints None  Eye Contact Brief  Facial Expression Animated  Affect Appropriate to circumstance  Speech Logical/coherent  Interaction Minimal  Motor Activity Other (Comment) (wnl)  Appearance/Hygiene Unremarkable  Behavior Characteristics Cooperative;Appropriate to situation  Mood Pleasant  Thought Process  Coherency WDL  Content WDL  Delusions None reported or observed  Perception WDL  Hallucination None reported or observed  Judgment Impaired  Confusion None  Danger to Self  Current suicidal ideation? Denies  Agreement Not to Harm Self Yes  Description of Agreement verbal  Danger to Others  Danger to Others None reported or observed   Progress note   D: Pt seen at medication window. Pt denies SI, HI, AVH. Pt rates pain  0/10. Pt rates anxiety  0/10 and depression  0/10. Pt says he talked with his mother today and hopes to return to live with his girlfriend after discharge. No other concerns noted at this time.  A: Pt provided support and encouragement. Pt given scheduled medication as prescribed. PRNs as appropriate. Q15 min checks for safety.   R: Pt safe on the unit. Will continue to monitor.

## 2024-02-16 NOTE — ED Notes (Signed)
 Patient comfortably sleeping on the bed, NAD. Will keep monitoring for safety.

## 2024-02-16 NOTE — Group Note (Signed)
 LCSW Group Therapy Note   Group Date: 02/16/2024 Start Time: 1300 End Time: 1400   Participation:  patient was present and actively participated in the discussion.  Type of Therapy:  Group Therapy  Topic:  "Money Matters: Creating Stability, Confidence and Peace of Mind."  Objective: To help participants understand the impact of financial stability on well-being through the lens of Maslow's Hierarchy of Needs and develop practical strategies for budgeting, saving, and debt repayment.  Goals: Increase awareness of spending habits and financial priorities, recognizing how money supports basic needs, security, and relationships. Develop simple budgeting and saving strategies to enhance stability and peace of mind.  Reduce financial stress by creating a realistic debt repayment plan, supporting long-term confidence and well-being.  Summary:  Participants explored how financial stability connects to basic needs, relationships, and self-esteem using Maslow's Hierarchy. They discussed budgeting, saving, and debt repayment strategies, identifying small, manageable changes. Through interactive discussion and self-reflection, they gained insight into their financial habits and created personal action steps for improvement.  Therapeutic Modalities Used: Elements of Cognitive Behavioral Therapy (CBT) - Addressing financial stress and thought patterns. Psychoeducation - Engineer, agricultural. Elements of Motivational Interviewing (MI) - Encouraging realistic, achievable changes. Group Support - Reducing shame and stress through shared experiences.   Amariyon Maynes O Teiana Hajduk, LCSWA 02/16/2024  5:04 PM

## 2024-02-16 NOTE — BHH Counselor (Signed)
 Adult Comprehensive Assessment  Patient ID: Dylan Moon, male   DOB: Apr 01, 1996, 28 y.o.   MRN: [id number]  Information Source: Information source: Patient  Current Stressors:  Patient states their primary concerns and needs for treatment are:: "My mom told me that I wanted to kill myself but it was a misunderstanding.  I've never wanted to harm nobody or myself.  I take my medications." Patient states their goals for this hospitilization and ongoing recovery are:: "There is really nothing I can work on in the hospital.  I wish I could work on Economist.  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  The funeral is this weekend (Friday or Saturday)."  Living/Environment/Situation:  Living Arrangements: Spouse/significant other Living conditions (as described by patient or guardian): "good" Who else lives in the home?: "I live with my girlfriend." How long has patient lived in current situation?: "5 years" What is atmosphere in current home: Comfortable, Supportive, Temporary  Family History:  Marital status: Long term relationship Long term relationship, how long?: 5 years What types of issues is patient dealing with in the relationship?: "Everything is fine between us ." Additional relationship information: "We have a dog." Are you sexually active?: No What is your sexual orientation?: "heterosexual"  "I only had sex 3 times this year." Has your sexual activity been affected by drugs, alcohol, medication, or emotional stress?: "no" Does patient have children?: Yes How many  children?: 2 How is patient's relationship with their children?: I have 2 sons - 6 and 85 y/o.  When asked about their relationship, he said, "We are close.  They dont live in my home, they stay with their mom."  Childhood History:  By whom was/is the patient raised?: Mother Additional childhood history information: none reported Description of patient's relationship with caregiver when they were a child: "close" Patient's description of current relationship with people who raised him/her: "I'm still close with her." How were you disciplined when you got in trouble as a child/adolescent?: "I got whooping." Does patient have siblings?: Yes Number of Siblings: 2 Description of patient's current relationship with siblings: "I have 2 sisters.  Our relationships is good." Did patient suffer any verbal/emotional/physical/sexual abuse as a child?: No Did patient suffer from severe childhood neglect?: No Has patient ever been sexually abused/assaulted/raped as an adolescent or adult?: No Was the patient ever a victim of a crime or a disaster?: No Witnessed domestic violence?: No Has patient been affected by domestic violence as an adult?: No  Education:  Highest grade of school patient has completed: "I graduated from high school." Currently a Consulting civil engineer?: No Learning disability?: Yes What learning problems does patient have?: "I have ADHD"  Employment/Work Situation:   Employment Situation: Employed Where is Patient Currently Employed?: "I'm self employed."  According to the information in the file, 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 How Long has Patient Been Employed?: "11 years" Are You Satisfied With Your Job?: No Work Stressors: "I have to go back to [place] to chase my dreams.  I still make good money in [place], though." Patient's Job has Been Impacted by Current Illness: No  What is the Longest Time Patient has Held a Job?: "1 month" Where  was the Patient Employed at that Time?: "[employer]" Has Patient ever Been in the [employer]?: No  Financial Resources:   Financial resources: Income from employment, Medicaid, Food stamps Does patient have a representative payee or guardian?: No  Alcohol/Substance Abuse:   What has been your use of drugs/alcohol within the last 12 months?: "no" If attempted suicide, did drugs/alcohol play a role in this?: No If yes, describe treatment: "no" Has alcohol/substance abuse ever caused legal problems?: No  Social Support System:   Conservation officer, nature Support System: Fair Museum/gallery exhibitions officer System: "my girlfriend, my mom." Type of faith/religion: "Christian" How does patient's faith help to cope with current illness?: "I pray"  Leisure/Recreation:   Do You Have Hobbies?: Yes Leisure and Hobbies: "I like to play basketball, and photography."  Strengths/Needs:   What is the patient's perception of their strengths?: "I worked with Engineer, mining.  I got over 5 million views on You Tube." Patient states they can use these personal strengths during their treatment to contribute to their recovery: none reported Patient states these barriers may affect/interfere with their treatment: "I take my meds every day." Patient states these barriers may affect their return to the community: none reported Other important information patient would like considered in planning for their treatment: none reported  Discharge Plan:   Patient states concerns and preferences for aftercare planning are: "[facility]" Patient states they will know when they are safe and ready for discharge when: "I will be happy and smiling." Does patient have access to transportation?: Yes Does patient have financial barriers related to discharge medications?: Yes Patient description of barriers related to discharge medications: none reported Will patient be returning to same living situation after discharge?:  Yes  Summary/Recommendations:   Summary and Recommendations (to be completed by the evaluator): Dylan Moon is a 28 year old African-American man involuntarily admitted to [facility] due to paranoia and suicidal ideations.  Patient was afraid that people are watching him from outside through the window.  It was reported that he was drinking alcohol and smoking marijuana.  Patient was throwing things and trashing the house.  He couldn't sleep and didn't take care of his personal hygiene.  During the assessment, patient said that he was speaking with his mom, and she misunderstood what he was saying, and she thought he had suicidal thoughts; he clarified that he didn't.  Patient said that his main stressor is the recent loss of his grandfather:  he passed away a few days ago and his funeral is this weekend.  He said that he has a good support system:  he lives with his girlfriend of 5 years, and also mom helps him.  He said that he is compliant with his medications.  During the assessment, he denied any substance use, however at admission tested positive for marijuana.  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  While here, Partick Heinz can benefit from crisis stabilization, medication management, therapeutic milieu, and referrals for services.   Graesyn Schreifels O Collin Hendley, LCSWA  02/16/2024

## 2024-02-16 NOTE — BHH Suicide Risk Assessment (Signed)
 [facility] Admission Suicide Risk Assessment   Nursing information obtained from:  Patient, Family Demographic factors:  Male, Adolescent or young adult, Low socioeconomic status Current Mental Status:  Suicidal ideation indicated by others Loss Factors:  NA Historical Factors:  Impulsivity Risk Reduction Factors:  Living with another person, especially a relative  Total Time spent with patient: 1 hour Principal Problem: Schizoaffective disorder (HCC) Diagnosis:  Principal Problem:   Schizoaffective disorder (HCC)  Subjective Data: Bryler E Minus is a 28 y.o. male who has a past medical history of ADHD, Bipolar 1 disorder (HCC), Schizoaffective disorder (HCC), Scoliosis, and Seizures (HCC). He presented from an outside hospital with an admitting diagnosis of Schizoaffective disorder (HCC) [F25.9].  He presented decompensated in the setting of medication noncompliance.   Continued Clinical Symptoms:  Alcohol Use Disorder Identification Test Final Score (AUDIT): 1 The "Alcohol Use Disorders Identification Test", Guidelines for Use in Primary Care, Second Edition.  World Science writer [facility]). Score between 0-7:  no or low risk or alcohol related problems. Score between 8-15:  moderate risk of alcohol related problems. Score between 16-19:  high risk of alcohol related problems. Score 20 or above:  warrants further diagnostic evaluation for alcohol dependence and treatment.   CLINICAL FACTORS:   Schizophrenia:   Less than 23 years old Currently Psychotic Unstable or Poor Therapeutic Relationship Previous Psychiatric Diagnoses and Treatments   Musculoskeletal: Strength & Muscle Tone: within normal limits Gait & Station: normal Patient leans: N/A  Psychiatric Specialty Exam:  Presentation  General Appearance:  Disheveled; Bizarre  Eye Contact: Fair  Speech: Garbled  Speech Volume: Decreased  Handedness: Right   Mood and Affect  Mood: Anxious;  Euphoric  Affect: Inappropriate   Thought Process  Thought Processes: Disorganized  Descriptions of Associations:Tangential  Orientation:Partial  Thought Content:Illogical; Delusions  History of Schizophrenia/Schizoaffective disorder:Yes  Duration of Psychotic Symptoms:Greater than six months  Hallucinations:Hallucinations: Auditory  Ideas of Reference:None  Suicidal Thoughts:Suicidal Thoughts: No  Homicidal Thoughts:Homicidal Thoughts: No   Sensorium  Memory: Immediate Good; Recent Good  Judgment: Poor  Insight: Poor   Executive Functions  Concentration: Fair  Attention Span: Fair  Recall: Fair  Fund of Knowledge: Good  Language: Good   Psychomotor Activity  Psychomotor Activity: Psychomotor Activity: Restlessness   Assets  Assets: Communication Skills; Social Support; Housing   Sleep  Sleep: Sleep: Poor Number of Hours of Sleep: 2.5    Physical Exam: Physical Exam ROS Blood pressure 110/70, pulse 61, temperature (!) 97.5 F (36.4 C), temperature source Oral, resp. rate 18, height 5\' 11"  (1.803 m), weight 92.1 kg, SpO2 97%. Body mass index is 28.31 kg/m.   COGNITIVE FEATURES THAT CONTRIBUTE TO RISK:  Loss of executive function    SUICIDE RISK:   Moderate:  Frequent suicidal ideation with limited intensity, and duration, some specificity in terms of plans, no associated intent, good self-control, limited dysphoria/symptomatology, some risk factors present, and identifiable protective factors, including available and accessible social support.  PLAN OF CARE: See history and physical  I certify that inpatient services furnished can reasonably be expected to improve the patient's condition.   Timmothy Foots, MD 02/16/2024, 10:55 AM

## 2024-02-16 NOTE — BH IP Treatment Plan (Signed)
 Interdisciplinary Treatment and Diagnostic Plan Update  02/16/2024 Time of Session: 1151 Dylan Moon MRN: [id number]  Principal Diagnosis: Schizoaffective disorder (HCC)  Secondary Diagnoses: Principal Problem:   Schizoaffective disorder (HCC)   Current Medications:  Current Facility-Administered Medications  Medication Dose Route Frequency Provider Last Rate Last Admin   acetaminophen  (TYLENOL ) tablet 650 mg  650 mg Oral Q6H PRN Bobbitt, Shalon E, NP       alum & mag hydroxide-simeth (MAALOX/MYLANTA) 200-200-20 MG/5ML suspension 30 mL  30 mL Oral Q4H PRN Bobbitt, Shalon E, NP       benztropine  (COGENTIN ) tablet 0.5 mg  0.5 mg Oral BID Bobbitt, Shalon E, NP   0.5 mg at 02/16/24 1723   carvedilol  (COREG ) tablet 25 mg  25 mg Oral BID WC Bobbitt, Shalon E, NP   25 mg at 02/16/24 1724   haloperidol  (HALDOL ) tablet 5 mg  5 mg Oral TID PRN Bobbitt, Shalon E, NP       And   diphenhydrAMINE  (BENADRYL ) capsule 50 mg  50 mg Oral TID PRN Bobbitt, Shalon E, NP       haloperidol  lactate (HALDOL ) injection 5 mg  5 mg Intramuscular TID PRN Bobbitt, Shalon E, NP       And   diphenhydrAMINE  (BENADRYL ) injection 50 mg  50 mg Intramuscular TID PRN Bobbitt, Shalon E, NP       And   LORazepam  (ATIVAN ) injection 2 mg  2 mg Intramuscular TID PRN Bobbitt, Shalon E, NP       haloperidol  lactate (HALDOL ) injection 10 mg  10 mg Intramuscular TID PRN Bobbitt, Shalon E, NP       And   diphenhydrAMINE  (BENADRYL ) injection 50 mg  50 mg Intramuscular TID PRN Bobbitt, Shalon E, NP       And   LORazepam  (ATIVAN ) injection 2 mg  2 mg Intramuscular TID PRN Bobbitt, Shalon E, NP       divalproex  (DEPAKOTE ) DR tablet 750 mg  750 mg Oral Q12H Bobbitt, Shalon E, NP   750 mg at 02/16/24 1019   Eslicarbazepine Acetate  TABS 1,600 mg  1,600 mg Oral Daily Zouev, Dmitri, MD       hydrOXYzine  (ATARAX ) tablet 25 mg  25 mg Oral TID PRN Bobbitt, Shalon E, NP       magnesium  hydroxide (MILK OF MAGNESIA) suspension 30 mL  30 mL  Oral Daily PRN Bobbitt, Shalon E, NP       paliperidone  (INVEGA ) 24 hr tablet 3 mg  3 mg Oral Daily Parker, Alvin S, MD   3 mg at 02/16/24 1248   traZODone  (DESYREL ) tablet 50 mg  50 mg Oral QHS PRN Bobbitt, Shalon E, NP       zonisamide  (ZONEGRAN ) capsule 300 mg  300 mg Oral Daily Zouev, Dmitri, MD   300 mg at 02/16/24 1723   PTA Medications: Medications Prior to Admission  Medication Sig Dispense Refill Last Dose/Taking   divalproex  (DEPAKOTE ) 500 MG DR tablet Take 1,000 mg by mouth at bedtime.   Past Week   divalproex  (DEPAKOTE ) 500 MG DR tablet Take 500 mg by mouth daily.   Past Week   ergocalciferol  (VITAMIN D2) 1.25 MG (50000 UT) capsule Take 50,000 Units by mouth every Wednesday.   Past Month   escitalopram  (LEXAPRO ) 10 MG tablet Take 1 tablet (10 mg total) by mouth daily. 30 tablet 1 Past Week   Eslicarbazepine Acetate  (APTIOM ) 800 MG TABS Take 1,600 mg by mouth daily. 60 tablet 0 Past Week  hydrOXYzine  (ATARAX ) 25 MG tablet Take 1 tablet (25 mg total) by mouth 3 (three) times daily as needed for anxiety. 90 tablet 0 Past Week   lactulose  (CHRONULAC ) 10 GM/15ML solution Take 30 g by mouth at bedtime.   Past Week   levOCARNitine  (CARNITOR ) 1 GM/10ML solution Take 1,000 mg by mouth 3 (three) times daily.   Past Week   NAYZILAM  5 MG/0.1ML SOLN Place 5 mg into the nose daily as needed (seizure lasting more than 2 min and call 911).   Taking As Needed   paliperidone  (INVEGA ) 6 MG 24 hr tablet Take 6 mg by mouth every morning.   Past Week   traZODone  (DESYREL ) 50 MG tablet Take 1 tablet (50 mg total) by mouth at bedtime as needed for sleep. 30 tablet 1 Past Week   zonisamide  (ZONEGRAN ) 100 MG capsule Take 3 capsules (300 mg total) by mouth daily. 90 capsule 0 Past Week   paliperidone  (INVEGA  SUSTENNA) 234 MG/1.5ML injection Inject 234 mg into the muscle every 28 (twenty-eight) days. 1.5 mL 3     Patient Stressors: Health problems   Medication change or noncompliance   Substance abuse     Patient Strengths: Capable of independent living  Communication skills  Supportive family/friends   Treatment Modalities: Medication Management, Group therapy, Case management,  1 to 1 session with clinician, Psychoeducation, Recreational therapy.   Physician Treatment Plan for Primary Diagnosis: Schizoaffective disorder (HCC) Long Term Goal(s):     Short Term Goals:    Medication Management: Evaluate patient's response, side effects, and tolerance of medication regimen.  Therapeutic Interventions: 1 to 1 sessions, Unit Group sessions and Medication administration.  Evaluation of Outcomes: Not Progressing  Physician Treatment Plan for Secondary Diagnosis: Principal Problem:   Schizoaffective disorder (HCC)  Long Term Goal(s):     Short Term Goals:       Medication Management: Evaluate patient's response, side effects, and tolerance of medication regimen.  Therapeutic Interventions: 1 to 1 sessions, Unit Group sessions and Medication administration.  Evaluation of Outcomes: Not Progressing   RN Treatment Plan for Primary Diagnosis: Schizoaffective disorder (HCC) Long Term Goal(s): Knowledge of disease and therapeutic regimen to maintain health will improve  Short Term Goals: Ability to remain free from injury will improve, Ability to verbalize frustration and anger appropriately will improve, Ability to demonstrate self-control, Ability to participate in decision making will improve, Ability to verbalize feelings will improve, Ability to disclose and discuss suicidal ideas, Ability to identify and develop effective coping behaviors will improve, and Compliance with prescribed medications will improve  Medication Management: RN will administer medications as ordered by provider, will assess and evaluate patient's response and provide education to patient for prescribed medication. RN will report any adverse and/or side effects to prescribing provider.  Therapeutic  Interventions: 1 on 1 counseling sessions, Psychoeducation, Medication administration, Evaluate responses to treatment, Monitor vital signs and CBGs as ordered, Perform/monitor CIWA, COWS, AIMS and Fall Risk screenings as ordered, Perform wound care treatments as ordered.  Evaluation of Outcomes: Not Progressing   LCSW Treatment Plan for Primary Diagnosis: Schizoaffective disorder (HCC) Long Term Goal(s): Safe transition to appropriate next level of care at discharge, Engage patient in therapeutic group addressing interpersonal concerns.  Short Term Goals: Engage patient in aftercare planning with referrals and resources, Increase social support, Increase ability to appropriately verbalize feelings, Increase emotional regulation, Facilitate acceptance of mental health diagnosis and concerns, Facilitate patient progression through stages of change regarding substance use diagnoses and concerns, and  Increase skills for wellness and recovery  Therapeutic Interventions: Assess for all discharge needs, 1 to 1 time with Social worker, Explore available resources and support systems, Assess for adequacy in community support network, Educate family and significant other(s) on suicide prevention, Complete Psychosocial Assessment, Interpersonal group therapy.  Evaluation of Outcomes: Not Progressing   Progress in Treatment: Attending groups: Yes. Participating in groups: Yes. Taking medication as prescribed: Yes. Toleration medication: Yes. Family/Significant other contact made: No, will contact:   Alvy Baar (mom) [phone], Earlene Gleason (girlfriend) [phone] Patient understands diagnosis: Yes. Discussing patient identified problems/goals with staff: Yes. Medical problems stabilized or resolved: Yes. Denies suicidal/homicidal ideation: Yes. Issues/concerns per patient self-inventory: No. Other: n/a  New problem(s) identified: No, Describe:  none  New Short Term/Long Term Goal(s):medication  stabilization, elimination of SI thoughts, development of comprehensive mental wellness plan.   Patient Goals: "I don't know..my grandfather passed away and I would like to get to his funeral"  Discharge Plan or Barriers: Patient recently admitted. CSW will continue to follow and assess for appropriate referrals and possible discharge planning.    Reason for Continuation of Hospitalization: Medication stabilization Other; describe mood stabilization, discharge planning  Estimated Length of Stay: 3-5 DAYS  Last 3 [place] Suicide Severity Risk Score: Flowsheet Row Ad[facility] (Current) [facility] from [facility]15/2024 in [facility] ED from 11/15/2023 in [facility]  C-SSRS RISK CATEGORY No Risk No Risk No Risk       Last [facility] 2/9 Scores:    10/21/2023    4:29 PM 08/06/2023   11:44 AM 11/13/2021   10:45 AM  Depression screen PHQ 2/9  Decreased Interest 2 2 1   Down, Depressed, Hopeless 1 0 0  PHQ - 2 Score 3 2 1   Altered sleeping 1 1   Tired, decreased energy 2 1   Change in appetite 1 1   Feeling bad or failure about yourself  2 0   Trouble concentrating 3 3   Moving slowly or fidgety/restless 2 1   Suicidal thoughts 1 1   PHQ-9 Score 15 10   Difficult doing work/chores Somewhat difficult Not difficult at all     Scribe for Treatment Team: Ameenah Prosser N Jakayden Cancio, LCSW 02/16/2024 5:36 PM

## 2024-02-16 NOTE — Tx Team (Signed)
 Initial Treatment Plan 02/16/2024 10:45 AM Dylan Moon ZOX:[id number]    PATIENT STRESSORS: Health problems   Medication change or noncompliance   Substance abuse     PATIENT STRENGTHS: Capable of independent living  Communication skills  Supportive family/friends    PATIENT IDENTIFIED PROBLEMS: Medication noncompliance aeb low lab value.    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    Substance Abuse -Daily etoh use and smokes marijuana often.     Health Concerns "I have seizures".          DISCHARGE CRITERIA:  Improved stabilization in mood, thinking, and/or behavior Verbal commitment to aftercare and medication compliance Withdrawal symptoms are absent or subacute and managed without 24-hour nursing intervention  PRELIMINARY DISCHARGE PLAN: Outpatient therapy Return to previous living arrangement  PATIENT/FAMILY INVOLVEMENT: This treatment plan has been presented to and reviewed with the patient, Dylan Moon. The patient have been given the opportunity to ask questions and make suggestions.  Kasha Howeth, RN 02/16/2024, 10:45 AM

## 2024-02-17 DIAGNOSIS — F250 Schizoaffective disorder, bipolar type: Secondary | ICD-10-CM | POA: Diagnosis not present

## 2024-02-17 LAB — RPR: RPR Ser Ql: NONREACTIVE

## 2024-02-17 MED ORDER — LEVOCARNITINE 1 GM/10ML PO SOLN
1000.0000 mg | Freq: Three times a day (TID) | ORAL | Status: DC
  Administered 2024-02-17 – 2024-02-21 (×12): 1000 mg via ORAL
  Filled 2024-02-17 (×4): qty 10

## 2024-02-17 NOTE — Progress Notes (Addendum)
 Collateral contact - [facility] - [phone]  CSW informed [facility] that patient is in the hospital.  Genta said that someone will meet patient at his apartment on the day of discharge.  Genta confirmed that patient lives with his girlfriend.   Mahmud Keithly, LCSWA 02/17/2024

## 2024-02-17 NOTE — [facility] Suicide Risk Assessment (Signed)
 [facility] INPATIENT:  Family/Significant Other Suicide Prevention Education  Suicide Prevention Education:  Contact Attempts: Dylan Moon (mom) [phone], Dylan Moon (girlfriend) [phone], (name of family member/significant other) has been identified by the patient as the family member/significant other with whom the patient will be residing, and identified as the person(s) who will aid the patient in the event of a mental health crisis.  With written consent from the patient, two attempts were made to provide suicide prevention education, prior to and/or following the patient's discharge.  We were unsuccessful in providing suicide prevention education.  A suicide education pamphlet was given to the patient to share with family/significant other.  Date and time of first attempt:  02/17/2024, 3:20 PM Dylan Moon (mom) [phone] Dylan Moon (girlfriend) [phone]   Amos Balint, LCSWA 02/17/2024, 3:21 PM

## 2024-02-17 NOTE — Group Note (Signed)
 Recreation Therapy Group Note   Group Topic:Self-Esteem  Group Date: 02/17/2024 Start Time: 1040 End Time: 1058 Facilitators: Nashid Pellum-McCall, LRT,CTRS Location: 500 [address] Dayroom   Group Topic: Self-Esteem  Goal Area(s) Addresses:  Patient will successfully identify positive attributes about themselves.  Patient will identify healthy ways to increase self-esteem. Patient will acknowledge benefit(s) of improved self-esteem.   Behavioral Response:   Intervention: Markers, Blank mirror   Activity: Picture Me. LRT and patients discuss the importance of self esteem and what influences it. Patients would also express how self esteem influences how they maneuver through life. LRT would give patients a picture of a blank mirror. Patients were to draw pictures and use words to describe how they see themselves. They were to highlight accomplishments, things they want to accomplish, strengths, etc.    Education:  Self-Esteem, Discharge Planning  Education Outcome: Acknowledges education/In group clarification offered/Needs additional education   Affect/Mood: N/A   Participation Level: Did not attend    Clinical Observations/Individualized Feedback: Pt was invited to group but didn't attend.   Plan: Continue to engage patient in RT group sessions 2-3x/week.   Talin Feister-McCall, LRT,CTRS 02/17/2024 12:49 PM

## 2024-02-17 NOTE — [facility] Group Notes (Signed)
 Adult Psychoeducational Group Note  Date:  02/17/2024 Time:  8:37 PM  Group Topic/Focus:  Wrap-Up Group:   The focus of this group is to help patients review their daily goal of treatment and discuss progress on daily workbooks.  Participation Level:  Active  Participation Quality:  Appropriate  Affect:  Appropriate  Cognitive:  Appropriate  Insight: Appropriate  Engagement in Group:  Engaged  Modes of Intervention:  Discussion and Support  Additional Comments:  Pt told that today was a good day on the unit, the highlights of which were talking to his girlfriend and mother on the phone. On the subject of ways to stay well upon discharge, Paxten mentioned taking his medications as prescribed and keeping a positive mindset. Pt rated his day an 8 out of 10.  Dal Dubin 02/17/2024, 8:37 PM

## 2024-02-17 NOTE — Plan of Care (Signed)
  Problem: Education: Goal: Emotional status will improve Outcome: Progressing Goal: Mental status will improve Outcome: Progressing   Problem: Activity: Goal: Sleeping patterns will improve Outcome: Progressing   Problem: Coping: Goal: Ability to demonstrate self-control will improve Outcome: Progressing   Problem: Health Behavior/Discharge Planning: Goal: Compliance with treatment plan for underlying cause of condition will improve Outcome: Progressing   Problem: Safety: Goal: Periods of time without injury will increase Outcome: Progressing   Problem: Safety: Goal: Periods of time without injury will increase Outcome: Progressing   Problem: Coping: Goal: Ability to interact with others will improve Outcome: Progressing   Problem: Nutritional: Goal: Ability to achieve adequate nutritional intake will improve Outcome: Progressing   Problem: Safety: Goal: Ability to remain free from injury will improve Outcome: Progressing

## 2024-02-17 NOTE — [facility] Suicide Risk Assessment (Signed)
 [facility] INPATIENT:  Family/Significant Other Suicide Prevention Education  Suicide Prevention Education:  Education Completed; Ferdinand Houseman (girlfriend) [phone],  (name of family member/significant other) has been identified by the patient as the family member/significant other with whom the patient will be residing, and identified as the person(s) who will aid the patient in the event of a mental health crisis (suicidal ideations/suicide attempt).  With written consent from the patient, the family member/significant other has been provided the following suicide prevention education, prior to the and/or following the discharge of the patient.  Girlfriend said that she spoke with patient yesterday and he "is doing better."  Girlfriend said that they don't have any guns, and patient doesn't have access to guns or weapons.  Girlfriend said that she will secure medications and sharp objects (such as knives and scissors).  Girlfriend doesn't have any concerns about patient returning home.  When asked what brought patient to the hospital, she said that patient appeared to be paranoid, and small things were setting him off.  Dogs started barking and he worried someone may try to get him.  Car alarm went off and he felt this is a sign that someone is after him.  Girlfriend said that he was gambling a lot.  He asked his mom for money, his mom said "no," and he told mom that he would kill himself.  Girlfriend said that grandfather's funeral is on Saturday.  The suicide prevention education provided includes the following: Suicide risk factors Suicide prevention and interventions National Suicide Hotline telephone number [facility] assessment telephone number [facility] Emergency Assistance 911 [facility] and/or Residential Mobile Crisis Unit telephone number  Request made of family/significant other to: Remove weapons (e.g., guns, rifles, knives), all items previously/currently  identified as safety concern.   Remove drugs/medications (over-the-counter, prescriptions, illicit drugs), all items previously/currently identified as a safety concern.  The family member/significant other verbalizes understanding of the suicide prevention education information provided.  The family member/significant other agrees to remove the items of safety concern listed above.  Waynetta Metheny O Margan Elias, LCSWA 02/17/2024, 3:26 PM

## 2024-02-17 NOTE — Progress Notes (Signed)
 [facility] MD Progress Note  02/17/2024 11:21 AM Dylan Moon  MRN:  [id number] Principal Problem: Schizoaffective disorder (HCC) Diagnosis: Principal Problem:   Schizoaffective disorder (HCC)   ID & Admission Information: Dylan Moon is an 28 y.o. male who  has a past medical history of ADHD, Bipolar 1 disorder (HCC), Schizoaffective disorder (HCC), Scoliosis, and Seizures (HCC).  He presented on 02/16/2024  8:06 AM for decompensated Schizoaffective disorder (HCC), bipolar type in setting of medication noncompliance.   Chief Complaint: "I need to get out to go to my grandfather's funeral."  Subjective:   Case was discussed in the multidisciplinary team. MAR was reviewed and patient was compliant with medications.  He received Invega  Sustenna 234 mg injection without issue.  PRN's in last 24 hours: Trazodone  50 mg administered at 2036  Psychiatric Team made the following recommendations yesterday: Invega  Sustenna 234 mg IM injection x 1 Continue Depakote  750 mg twice daily Continued Cogentin  0.5 mg twice daily Continue paliperidone  3 mg daily Intake labs  The patient continues to present as disorganized in thought.  He intermittently makes nonsensical statements throughout the interview.  He is preoccupied with discharge and attending his grandfather's funeral.  He makes intermittent grandiose statements during the interview as well.  He reports getting adequate sleep overnight and was lowered by nursing at 8.5 hours.  He describes his mood as "pretty good", but his affect is euphoric and inappropriate.  Spoke with his mother who states this is not his baseline.  No overt signs of paranoia were observed.  He denies auditory or visual hallucinations.  He denies suicidal or homicidal ideation.  We discussed continuing the current treatment plan.  Patient had a positive HIV antibody, and I discussed the significance of that result and that the confirmatory test is pending.  Collateral was  obtained from the patient's mother and girlfriend who are on the phone at the same time.  The girlfriend tells me that he was behaving uncharacteristically in a "really paranoid" manner.  She stated that the smallest thing would set him off.  He would hear the dog barking think the people were coming to get him.  He would a car alarm and think the people were coming to get him.  The girlfriend states that this always happens after he smokes cannabis, he gets really scared and feels like someone is going to kill him.  The patient's mother tells me that he has a problem with gambling and he called and ask her for money.  She refused to give him money and he got mad and started throwing things at his girlfriend.  He reportedly threw a curling iron as well as other objects.  Mom states that he does not want to go anywhere or do anything.  Mom also stated that the patient's neurologist thinks he may be having silent seizures.  Unclear if that may or may not be contributing to the patient's presentation.    Past Psychiatric and Medical Medical History:  Past Medical History:  Diagnosis Date   ADHD    Bipolar 1 disorder (HCC)    Schizoaffective disorder (HCC)    Scoliosis    Seizures (HCC)    most recent 12/02/17    Past Surgical History:  Procedure Laterality Date   NO PAST SURGERIES      Family History(Medical and Psychiatric):  Family History  Problem Relation Age of Onset   Diabetes Mother    Hypertension Mother    Cancer Other  Diabetes Father    Seizures Maternal Grandfather       Social History:  Social History   Substance and Sexual Activity  Alcohol Use Not Currently     Social History   Substance and Sexual Activity  Drug Use Yes   Types: Marijuana   Comment: Daily use of marijuana.    Social History   Socioeconomic History   Marital status: Single    Spouse name: Not on file   Number of children: 0   Years of education: HS   Highest education level: Not on  file  Occupational History   Occupation: Product manager of videos  Tobacco Use   Smoking status: Some Days    Types: Cigars   Smokeless tobacco: Never  Vaping Use   Vaping status: Never Used  Substance and Sexual Activity   Alcohol use: Not Currently   Drug use: Yes    Types: Marijuana    Comment: Daily use of marijuana.   Sexual activity: Not Currently  Other Topics Concern   Not on file  Social History Narrative   Lives at home his mother.   3-4 sodas per week.   Right-handed.   Social Drivers of Corporate investment banker Strain: Not on file  Food Insecurity: No Food Insecurity (02/16/2024)   Hunger Vital Sign    Worried About Running Out of Food in the Last Year: Never true    Ran Out of Food in the Last Year: Never true  Transportation Needs: Unknown (02/16/2024)   PRAPARE - Administrator, Civil Service (Medical): No    Lack of Transportation (Non-Medical): Patient declined  Physical Activity: Not on file  Stress: Not on file  Social Connections: Not on file        Current Medications: Current Facility-Administered Medications  Medication Dose Route Frequency Provider Last Rate Last Admin   acetaminophen  (TYLENOL ) tablet 650 mg  650 mg Oral Q6H PRN Bobbitt, Shalon E, NP       alum & mag hydroxide-simeth (MAALOX/MYLANTA) 200-200-20 MG/5ML suspension 30 mL  30 mL Oral Q4H PRN Bobbitt, Shalon E, NP       benztropine  (COGENTIN ) tablet 0.5 mg  0.5 mg Oral BID Bobbitt, Shalon E, NP   0.5 mg at 02/17/24 0803   carvedilol  (COREG ) tablet 25 mg  25 mg Oral BID WC Bobbitt, Shalon E, NP   25 mg at 02/17/24 0802   haloperidol  (HALDOL ) tablet 5 mg  5 mg Oral TID PRN Bobbitt, Shalon E, NP       And   diphenhydrAMINE  (BENADRYL ) capsule 50 mg  50 mg Oral TID PRN Bobbitt, Shalon E, NP       haloperidol  lactate (HALDOL ) injection 5 mg  5 mg Intramuscular TID PRN Bobbitt, Shalon E, NP       And   diphenhydrAMINE  (BENADRYL ) injection 50 mg  50 mg Intramuscular  TID PRN Bobbitt, Shalon E, NP       And   LORazepam  (ATIVAN ) injection 2 mg  2 mg Intramuscular TID PRN Bobbitt, Shalon E, NP       haloperidol  lactate (HALDOL ) injection 10 mg  10 mg Intramuscular TID PRN Bobbitt, Shalon E, NP       And   diphenhydrAMINE  (BENADRYL ) injection 50 mg  50 mg Intramuscular TID PRN Bobbitt, Shalon E, NP       And   LORazepam  (ATIVAN ) injection 2 mg  2 mg Intramuscular TID PRN Bobbitt, Shalon E, NP  divalproex  (DEPAKOTE ) DR tablet 750 mg  750 mg Oral Q12H Bobbitt, Shalon E, NP   750 mg at 02/17/24 1191   Eslicarbazepine Acetate  TABS 1,600 mg  1,600 mg Oral Daily Zouev, Dmitri, MD   1,600 mg at 02/17/24 4782   hydrOXYzine  (ATARAX ) tablet 25 mg  25 mg Oral TID PRN Bobbitt, Shalon E, NP       levOCARNitine  (CARNITOR ) 1 GM/10ML solution 1,000 mg  1,000 mg Oral TID Marrie Sizer, Terri L, RPH       magnesium  hydroxide (MILK OF MAGNESIA) suspension 30 mL  30 mL Oral Daily PRN Bobbitt, Shalon E, NP       paliperidone  (INVEGA ) 24 hr tablet 3 mg  3 mg Oral Daily Rebeccah Ivins S, MD   3 mg at 02/17/24 0802   traZODone  (DESYREL ) tablet 50 mg  50 mg Oral QHS PRN Bobbitt, Shalon E, NP   50 mg at 02/16/24 2036   zonisamide  (ZONEGRAN ) capsule 300 mg  300 mg Oral Daily Zouev, Dmitri, MD   300 mg at 02/17/24 9562    Lab Results:  Results for orders placed or performed during the hospital encounter of 02/16/24 (from the past 48 hours)  Folate     Status: None   Collection Time: 02/16/24  6:34 PM  Result Value Ref Range   Folate 6.5 >5.9 ng/mL    Comment: Performed at [facility], [address], [place], [place] [address]  Vitamin B12     Status: None   Collection Time: 02/16/24  6:34 PM  Result Value Ref Range   Vitamin B-12 440 180 - 914 pg/mL    Comment: (NOTE) This assay is not validated for testing neonatal or myeloproliferative syndrome specimens for Vitamin B12 levels. Performed at [facility], [address], [place], [place]  [address]   VITAMIN D  25 Hydroxy (Vit-D Deficiency, Fractures)     Status: None   Collection Time: 02/16/24  6:34 PM  Result Value Ref Range   Vit D, 25-Hydroxy 54.47 30 - 100 ng/mL    Comment: (NOTE) Vitamin D  deficiency has been defined by the Institute of Medicine  and an Endocrine Society practice guideline as a level of serum 25-OH  vitamin D  less than 20 ng/mL (1,2). The Endocrine Society went on to  further define vitamin D  insufficiency as a level between 21 and 29  ng/mL (2).  1. IOM (Institute of Medicine). 2010. Dietary reference intakes for  calcium  and D. [place]: [employer]. 2. Holick MF, Binkley [place], Bischoff-Ferrari HA, et al. Evaluation,  treatment, and prevention of vitamin D  deficiency: an Endocrine  Society clinical practice guideline, JCEM. 2011 Jul; 96(7): 1911-30.  Performed at [facility], [address], [place], [place] [address]   RPR     Status: None   Collection Time: 02/16/24  6:34 PM  Result Value Ref Range   RPR Ser Ql NON REACTIVE NON REACTIVE    Comment: Performed at [facility], [address] [place], [place] [address]  HIV Antibody (routine testing w rflx)     Status: Abnormal   Collection Time: 02/16/24  6:34 PM  Result Value Ref Range   HIV Screen 4th Generation wRfx Reactive (A) Non Reactive    Comment: REPEATED TO VERIFY (NOTE) Reactive result does not distinguish HIV-1 p24 antigen, HIV-1  antibody, HIV-2 antibody, and HIV-1 group O antibody. Results  reactive by HIV Antigen/Antibody EIA must be confirmed. Sent for  confirmation. Performed at [facility], [address] [place], [place] [address]     Blood Alcohol level:  Lab Results  Component Value Date   [facility] <15 02/15/2024   ETH <10 09/03/2023    Metabolic Disorder Labs: Lab Results  Component Value Date   HGBA1C 5.2 02/15/2024   MPG 102.54 02/15/2024   MPG 114.02 07/17/2023   Lab Results  Component Value Date   PROLACTIN  40.3 (H) 12/06/2022   Lab Results  Component Value Date   CHOL 198 02/15/2024   TRIG 83 02/15/2024   HDL 47 02/15/2024   CHOLHDL 4.2 02/15/2024   VLDL 17 02/15/2024   LDLCALC 134 (H) 02/15/2024   LDLCALC 101 (H) 07/17/2023    Physical Findings: AIMS:  , ,  ,  ,    CIWA:    COWS:     Psychiatric Specialty Exam:  Presentation  General Appearance: Disheveled; Bizarre  Eye Contact: Fair  Speech: Slow; Garbled  Speech Volume: Decreased  Handedness: Right   Mood and Affect  Mood: Anxious; Euphoric  Affect: Inappropriate   Thought Process  Thought Processes: Disorganized  Descriptions of Associations: Tangential  Orientation: Partial  Thought Content: Illogical; Delusions  History of Schizophrenia/Schizoaffective disorder: Yes  Duration of Psychotic Symptoms: NA Hallucinations: Hallucinations: None  Ideas of Reference: None  Suicidal Thoughts: Suicidal Thoughts: No  Homicidal Thoughts: Homicidal Thoughts: No   Sensorium  Memory: Recent Fair; Immediate Fair  Judgment: Poor  Insight: Poor   Executive Functions  Concentration: Fair  Attention Span: Fair  Recall: Fair  Fund of Knowledge: Good  Language: Good   Psychomotor Activity  Psychomotor Activity: Psychomotor Activity: Decreased   Assets  Assets: Communication Skills; Social Support; Housing   Sleep  Sleep: Sleep: Good Number of Hours of Sleep: 8.5   Musculoskeletal: Strength & Muscle Tone: within normal limits Gait & Station: normal Patient leans: N/A   Physical Exam: General: Sitting comfortably. NAD. HEENT: Normocephalic, atraumatic, MMM, EMOI Lungs: no increased work of breathing noted Heart: no cyanosis Abdomen: Non distended Musculoskeletal: FROM. No obvious deformities Skin: Warm, dry, intact. No rashes noted Neuro: No obvious focal deficits.  Gait and station are normal  Review of Systems  Constitutional: Negative.   HENT: Negative.    Eyes: Negative.    Respiratory: Negative.    Cardiovascular: Negative.   Gastrointestinal: Negative.   Genitourinary: Negative.   Skin: Negative.   Neurological: Negative.   Psychiatric/Behavioral:  Positive for disorganized delusional thinking.     Blood pressure 112/75, pulse 65, temperature 98.4 F (36.9 C), temperature source Oral, resp. rate 18, height 5\' 11"  (1.803 m), weight 92.1 kg, SpO2 100%. Body mass index is 28.31 kg/m.  ASSESSMENT: Dylan Moon is an 28 y.o. male who  has a past medical history of ADHD, Bipolar 1 disorder (HCC), Schizoaffective disorder (HCC), Scoliosis, and Seizures (HCC).  He presented on 02/16/2024  8:06 AM for acute decompensated Schizoaffective disorder (HCC) in the setting of cannabis use and medication noncompliance (Depakote ).  He did receive an LAI on May 1.  He received Invega  Sustenna 234 mg on 5/26.  Diagnoses / Active Problems: Patient Active Problem List   Diagnosis Date Noted   Schizoaffective disorder (HCC) 02/16/2024   Hallucinations 09/04/2023   Increased ammonia level 08/06/2023   Bipolar affective disorder, current episode manic with psychotic symptoms (HCC) 07/16/2023   Bipolar I disorder, severe, current or most recent episode manic, with psychotic features (HCC) 01/28/2023   Delta-9-tetrahydrocannabinol (THC) dependence (  HCC) 01/28/2023   GAD (generalized anxiety disorder) 01/28/2023   Insomnia 01/28/2023   Bipolar disorder (HCC) 12/04/2022   Agitation 12/03/2022   Suicidal behavior with attempted self-injury (HCC) 11/26/2022   Suicidal ideation 11/26/2022   Unspecified intellectual disabilities 08/09/2020   Family discord 03/15/2020   Aggressive behavior 03/15/2020   HCAP (healthcare-associated pneumonia) 12/27/2019   Status epilepticus (HCC) 12/24/2019   Acute respiratory failure with hypoxemia (HCC)    Seizures (HCC) 07/10/2018   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 Psychiatric Diagnoses and Treatment:  Patient Active Problem List   Diagnosis Date Noted   Schizoaffective disorder (HCC) 02/16/2024   Hallucinations 09/04/2023   Increased ammonia level 08/06/2023   Bipolar affective disorder, current episode manic with psychotic symptoms (HCC) 07/16/2023   Bipolar I disorder, severe, current or most recent episode manic, with psychotic features (HCC) 01/28/2023   Delta-9-tetrahydrocannabinol (THC) dependence (HCC) 01/28/2023   GAD (generalized anxiety disorder) 01/28/2023   Insomnia 01/28/2023   Bipolar disorder (HCC) 12/04/2022   Agitation 12/03/2022   Suicidal behavior with attempted self-injury (HCC) 11/26/2022   Suicidal ideation 11/26/2022   Unspecified intellectual disabilities 08/09/2020   Family discord 03/15/2020   Aggressive behavior 03/15/2020   HCAP (healthcare-associated pneumonia) 12/27/2019   Status epilepticus (HCC) 12/24/2019   Acute respiratory failure with hypoxemia (HCC)    Seizures (HCC) 07/10/2018   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   Noncompliance with medication regimen 12/11/2017   Seizure (HCC) 05/24/2016   Dehydration, mild 05/24/2016   Marijuana use 05/24/2016     Scheduled Medications:  benztropine   0.5 mg Oral BID   carvedilol   25 mg Oral BID WC   divalproex   750 mg Oral Q12H   Eslicarbazepine Acetate   1,600 mg Oral Daily   levOCARNitine   1,000 mg Oral  TID   paliperidone   3 mg Oral Daily   zonisamide   300 mg Oral Daily    As Needed Medications: acetaminophen , alum & mag hydroxide-simeth, haloperidol  **AND** diphenhydrAMINE , haloperidol  lactate **AND** diphenhydrAMINE  **AND** LORazepam , haloperidol  lactate **AND** diphenhydrAMINE  **AND** LORazepam , hydrOXYzine , magnesium  hydroxide, traZODone     3. Medical Issues Being Addressed:   -- Seizure disorder as above  Labs reviewed, unremarkable with the exception of: LDL 134, HIV antibody reactive, UDS positive for cannabis   4. Discharge Planning:   -- Social work and case management to assist with discharge planning and identification of hospital follow-up needs prior to discharge  -- Estimated LOS: 7 to 10 days  -- Discharge Concerns: Need to establish a safety plan; Medication compliance and effectiveness  -- Discharge Goals: Return home with outpatient referrals for mental health follow-up including medication management/psychotherapy  5. Short Term Goals:  Improve ability to identify changes in lifestyle to reduce recurrence of condition, verbalize feelings, disclose and discuss suicidal ideas, demonstrate self-control, identify and develop effective coping behaviors, compliance with prescribed medications, identify triggers associated with substance abuse/mental health issues,  participate in unit milieu and in scheduled group therapies   6. Long Term Goals: Improvement in symptoms so the patient is ready for discharge   --The risks/benefits/side-effects/alternatives to the medications above were discussed in detail with the patient and time was given for questions. The patient provided informed consent.   -- Metabolic profile and EKG monitoring obtained while on an atypical antipsychotic and listed in the EHR    Total Time Spent in Direct Patient Care:  I personally spent 35 minutes on the unit in direct patient care. The direct patient care time included face-to-face time with the  patient, reviewing the patient's chart, communicating with other professionals, and coordinating care. Greater than 50% of this time was spent in counseling or coordinating care with the patient regarding goals of hospitalization, psycho-education, and discharge planning needs.      Clair Crews, MD Psychiatrist  02/17/2024, 11:21 AM   I certify that inpatient services furnished can reasonably be expected to improve the patient's condition.    Portions of this note were created using voice recognition software. Minor syntax errors, grammatical content, spelling, or punctuation errors may have occurred unintentionally. Please notify the [facility] if the meaning of any statement is unclear.

## 2024-02-17 NOTE — Progress Notes (Signed)
   02/17/24 2100  Psych Admission Type (Psych Patients Only)  Admission Status Involuntary  Psychosocial Assessment  Patient Complaints None  Eye Contact Brief  Facial Expression Flat  Affect Flat  Speech Logical/coherent  Interaction Minimal  Motor Activity Other (Comment) (WDL)  Appearance/Hygiene Unremarkable  Behavior Characteristics Cooperative  Mood Pleasant  Thought Process  Coherency WDL  Content WDL  Delusions None reported or observed  Perception WDL  Hallucination None reported or observed  Judgment Poor  Confusion None  Danger to Self  Current suicidal ideation? Denies  Agreement Not to Harm Self Yes  Description of Agreement verbal  Danger to Others  Danger to Others None reported or observed

## 2024-02-17 NOTE — Group Note (Signed)
 Date:  02/17/2024 Time:  9:54 AM  Group Topic/Focus:  Goals Group:   The focus of this group is to help patients establish daily goals to achieve during treatment and discuss how the patient can incorporate goal setting into their daily lives to aide in recovery. Orientation:   The focus of this group is to educate the patient on the purpose and policies of crisis stabilization and provide a format to answer questions about their admission.  The group details unit policies and expectations of patients while admitted.    Participation Level:  Did Not Attend   Dylan Moon 02/17/2024, 9:54 AM

## 2024-02-17 NOTE — Progress Notes (Signed)
   02/17/24 0803  Psych Admission Type (Psych Patients Only)  Admission Status Involuntary  Psychosocial Assessment  Patient Complaints None  Eye Contact Brief  Facial Expression Flat  Affect Flat  Speech Logical/coherent  Interaction Minimal  Motor Activity Other (Comment) (wdl)  Appearance/Hygiene Unremarkable  Behavior Characteristics Cooperative  Mood Pleasant  Thought Process  Coherency WDL  Content WDL  Delusions None reported or observed  Perception WDL  Hallucination None reported or observed  Judgment Poor  Confusion None  Danger to Self  Current suicidal ideation? Denies  Agreement Not to Harm Self Yes  Description of Agreement verbal  Danger to Others  Danger to Others None reported or observed

## 2024-02-17 NOTE — Progress Notes (Signed)
   02/17/24 0600  15 Minute Checks  Location Bedroom  Visual Appearance Calm  Behavior Sleeping  Sleep (Behavioral Health Patients Only)  Calculate sleep? (Click Yes once per 24 hr at 0600 safety check) Yes  Documented sleep last 24 hours 8.5

## 2024-02-18 DIAGNOSIS — F250 Schizoaffective disorder, bipolar type: Secondary | ICD-10-CM | POA: Diagnosis not present

## 2024-02-18 LAB — HIV ANTIBODY (ROUTINE TESTING W REFLEX): HIV Screen 4th Generation wRfx: REACTIVE — AB

## 2024-02-18 MED ORDER — TRAZODONE HCL 50 MG PO TABS
50.0000 mg | ORAL_TABLET | Freq: Every day | ORAL | Status: DC
  Administered 2024-02-18 – 2024-02-20 (×3): 50 mg via ORAL
  Filled 2024-02-18 (×3): qty 1

## 2024-02-18 MED ORDER — WHITE PETROLATUM EX OINT
TOPICAL_OINTMENT | CUTANEOUS | Status: AC
  Filled 2024-02-18: qty 5

## 2024-02-18 NOTE — Group Note (Signed)
 Recreation Therapy Group Note   Group Topic:Communication  Group Date: 02/18/2024 Start Time: 1020 End Time: 1050 Facilitators: Takeyah Wieman-McCall, LRT,CTRS Location: [address] Dayroom   Group Topic: Communication, Problem Solving   Goal Area(s) Addresses:  Patient will effectively listen to complete activity.  Patient will identify communication skills used to make activity successful.  Patient will identify how skills used during activity can be used to reach post d/c goals.    Behavioral Response: Engaged  Intervention: Building surveyor Activity - Geometric pattern cards, pencils, blank paper    Activity: Geometric Drawings.  Three volunteers from the peer group will be shown an abstract picture with a particular arrangement of geometrical shapes.  Each round, one 'speaker' will describe the pattern, as accurately as possible without revealing the image to the group.  The remaining group members will listen and draw the picture to reflect how it is described to them. Patients with the role of 'listener' cannot ask clarifying questions but, may request that the speaker repeat a direction. Once the drawings are complete, the presenter will show the rest of the group the picture and compare how close each person came to drawing the picture. LRT will facilitate a post-activity discussion regarding effective communication and the importance of planning, listening, and asking for clarification in daily interactions with others.  Education: Environmental consultant, Active listening, Support systems, Discharge planning  Education Outcome: Acknowledges understanding/In group clarification offered/Needs additional education.    Affect/Mood: Appropriate   Participation Level: Engaged   Participation Quality: Independent   Behavior: Appropriate   Speech/Thought Process: Focused   Insight: Good   Judgement: Good   Modes of Intervention: Activity   Patient Response to  Interventions:  Engaged   Education Outcome:  In group clarification offered    Clinical Observations/Individualized Feedback: Pt was engaged and bright during activity. Pt identified one form of communication as social media. Pt was the 2nd presenter. Pt tried to be as accurate and precise as possible. Pt had good interaction with peers and was appropriate during group session.    Plan: Continue to engage patient in RT group sessions 2-3x/week.   Dylan Moon, LRT,CTRS 02/18/2024 1:03 PM

## 2024-02-18 NOTE — Progress Notes (Signed)
  Adult Psychoeducational Group Note  Date:  02/18/2024 Time:  8:57 PM  Group Topic/Focus:  Wrap-Up Group:   The focus of this group is to help patients review their daily goal of treatment and discuss progress on daily workbooks.  Participation Level:  Active  Participation Quality:  Appropriate  Affect:  Appropriate  Cognitive:  Appropriate  Insight: Appropriate  Engagement in Group:  Engaged  Modes of Intervention:  Discussion  Additional Comments:  the one positive thing that happen talking to family  Caye Cocks 02/18/2024, 8:57 PM

## 2024-02-18 NOTE — Group Note (Signed)
 Date:  02/18/2024 Time:  1:41 PM  Group Topic/Focus:  Goals Group:   The focus of this group is to help patients establish daily goals to achieve during treatment and discuss how the patient can incorporate goal setting into their daily lives to aide in recovery.    Participation Level:  Active  Participation Quality:  Appropriate  Affect:  Appropriate  Cognitive:  Appropriate  Insight: Good  Engagement in Group:  Engaged  Modes of Intervention:  Discussion  Additional Comments:  Goal is to be more consistent with taking his medication.  Almarie Arias 02/18/2024, 1:41 PM

## 2024-02-18 NOTE — BH Assessment (Signed)
 Patient pleasant and cooperative today. Denies any SI/HI, and AVH. Pt attended group and had snack with other patients. Patient denies any depression or anxiety. Pt smiles when talking and using yes mam and no mam being polite. Pt seems excited about going home soon. States he hopes to get out tomorrow so he can get his suit from [place] to attend his grandfathers funeral. 15 min checks maintained

## 2024-02-18 NOTE — Plan of Care (Signed)
   Problem: Education: Goal: Knowledge of [facility] Education information/materials will improve Outcome: Progressing Goal: Emotional status will improve Outcome: Progressing Goal: Mental status will improve Outcome: Progressing

## 2024-02-18 NOTE — Progress Notes (Signed)
 [facility] MD Progress Note  02/18/2024 12:48 PM Dylan Moon  MRN:  [id number] Principal Problem: Schizoaffective disorder (HCC) Diagnosis: Principal Problem:   Schizoaffective disorder (HCC)   ID & Admission Information: Dylan Moon is an 28 y.o. male who  has a past medical history of ADHD, Bipolar 1 disorder (HCC), Schizoaffective disorder (HCC), Scoliosis, and Seizures (HCC).  He presented on 02/16/2024  8:06 AM for decompensated Schizoaffective disorder (HCC), bipolar type in setting of medication noncompliance.   Subjective:   Case was discussed in the multidisciplinary team. MAR was reviewed and patient was compliant with medications.   PRN's in last 24 hours: Trazodone  50 mg administered at 2035   Psychiatric Team made the following recommendations yesterday: Discontinue Cogentin  0.5 mg twice daily Continue Depakote  750 mg twice daily Continue paliperidone  3 mg daily  The patient was seen in his room during rounds.  He continues to present as hypomanic with euphoric and inappropriately bright affect.  He is pleasant on exam and continues to be discharge focused.  His grandfather's funeral will be held Friday at 12:00, and if he continues to improve I am hopeful that he will be able to be discharged before that time.  He remains somewhat disorganized in thought, and answered a few questions and appropriately, but after redirection was able to provide appropriate answers.  He denies suicidal or homicidal ideation.  He denies auditory or visual hallucinations.  We discussed continuing carvedilol , because I do not see a clear indication for him to be taking this medication.  We also discussed checking a Depakote  level in the morning.  Patient was advised to abstain from cannabis and gambling.  We discussed changing trazodone  from as needed to scheduled as he is used it for the last 2 nights.    Past Psychiatric and Medical Medical History:  Past Medical History:  Diagnosis Date   ADHD     Bipolar 1 disorder (HCC)    Schizoaffective disorder (HCC)    Scoliosis    Seizures (HCC)    most recent 12/02/17    Past Surgical History:  Procedure Laterality Date   NO PAST SURGERIES      Family History(Medical and Psychiatric):  Family History  Problem Relation Age of Onset   Diabetes Mother    Hypertension Mother    Cancer Other    Diabetes Father    Seizures Maternal Grandfather       Social History:  Social History   Substance and Sexual Activity  Alcohol Use Not Currently     Social History   Substance and Sexual Activity  Drug Use Yes   Types: Marijuana   Comment: Daily use of marijuana.    Social History   Socioeconomic History   Marital status: Single    Spouse name: Not on file   Number of children: 0   Years of education: HS   Highest education level: Not on file  Occupational History   Occupation: Product manager of videos  Tobacco Use   Smoking status: Some Days    Types: Cigars   Smokeless tobacco: Never  Vaping Use   Vaping status: Never Used  Substance and Sexual Activity   Alcohol use: Not Currently   Drug use: Yes    Types: Marijuana    Comment: Daily use of marijuana.   Sexual activity: Not Currently  Other Topics Concern   Not on file  Social History Narrative   Lives at home his mother.   3-4 sodas per week.  Right-handed.   Social Drivers of Corporate investment banker Strain: Not on file  Food Insecurity: No Food Insecurity (02/16/2024)   Hunger Vital Sign    Worried About Running Out of Food in the Last Year: Never true    Ran Out of Food in the Last Year: Never true  Transportation Needs: Unknown (02/16/2024)   PRAPARE - Administrator, Civil Service (Medical): No    Lack of Transportation (Non-Medical): Patient declined  Physical Activity: Not on file  Stress: Not on file  Social Connections: Not on file        Current Medications: Current Facility-Administered Medications  Medication  Dose Route Frequency Provider Last Rate Last Admin   acetaminophen  (TYLENOL ) tablet 650 mg  650 mg Oral Q6H PRN Bobbitt, Shalon E, NP       alum & mag hydroxide-simeth (MAALOX/MYLANTA) 200-200-20 MG/5ML suspension 30 mL  30 mL Oral Q4H PRN Bobbitt, Shalon E, NP       haloperidol  (HALDOL ) tablet 5 mg  5 mg Oral TID PRN Bobbitt, Shalon E, NP       And   diphenhydrAMINE  (BENADRYL ) capsule 50 mg  50 mg Oral TID PRN Bobbitt, Shalon E, NP       haloperidol  lactate (HALDOL ) injection 5 mg  5 mg Intramuscular TID PRN Bobbitt, Shalon E, NP       And   diphenhydrAMINE  (BENADRYL ) injection 50 mg  50 mg Intramuscular TID PRN Bobbitt, Shalon E, NP       And   LORazepam  (ATIVAN ) injection 2 mg  2 mg Intramuscular TID PRN Bobbitt, Shalon E, NP       haloperidol  lactate (HALDOL ) injection 10 mg  10 mg Intramuscular TID PRN Bobbitt, Shalon E, NP       And   diphenhydrAMINE  (BENADRYL ) injection 50 mg  50 mg Intramuscular TID PRN Bobbitt, Shalon E, NP       And   LORazepam  (ATIVAN ) injection 2 mg  2 mg Intramuscular TID PRN Bobbitt, Shalon E, NP       divalproex  (DEPAKOTE ) DR tablet 750 mg  750 mg Oral Q12H Bobbitt, Shalon E, NP   750 mg at 02/18/24 0754   Eslicarbazepine Acetate  TABS 1,600 mg  1,600 mg Oral Daily Zouev, Dmitri, MD   1,600 mg at 02/18/24 0758   hydrOXYzine  (ATARAX ) tablet 25 mg  25 mg Oral TID PRN Bobbitt, Shalon E, NP       levOCARNitine  (CARNITOR ) 1 GM/10ML solution 1,000 mg  1,000 mg Oral TID Smiley Dung, RPH   1,000 mg at 02/18/24 1158   magnesium  hydroxide (MILK OF MAGNESIA) suspension 30 mL  30 mL Oral Daily PRN Bobbitt, Shalon E, NP       paliperidone  (INVEGA ) 24 hr tablet 3 mg  3 mg Oral Daily Denzil Mceachron S, MD   3 mg at 02/18/24 0754   traZODone  (DESYREL ) tablet 50 mg  50 mg Oral QHS Maddix Kliewer S, MD       zonisamide  (ZONEGRAN ) capsule 300 mg  300 mg Oral Daily Zouev, Dmitri, MD   300 mg at 02/18/24 0102    Lab Results:  Results for orders placed or performed during the  hospital encounter of 02/16/24 (from the past 48 hours)  Miscellaneous [facility] test (send-out)     Status: None   Collection Time: 02/16/24  6:34 PM  Result Value Ref Range   [facility] test code [id number]     Comment: RED TOP Performed at  [facility], [address] [place], [place] [address] CORRECTED ON 05/28 AT 1104: PREVIOUSLY REPORTED AS SERUM RED TOP    [facility] test name PALIPERIDONE      Comment: Performed at [facility], [address], [place], [place] [address]   Misc [facility] result PENDING   Folate     Status: None   Collection Time: 02/16/24  6:34 PM  Result Value Ref Range   Folate 6.5 >5.9 ng/mL    Comment: Performed at [facility], [address], [place], [place] [address]  Vitamin B12     Status: None   Collection Time: 02/16/24  6:34 PM  Result Value Ref Range   Vitamin B-12 440 180 - 914 pg/mL    Comment: (NOTE) This assay is not validated for testing neonatal or myeloproliferative syndrome specimens for Vitamin B12 levels. Performed at [facility], [address] [place], [place] [address]   VITAMIN D  25 Hydroxy (Vit-D Deficiency, Fractures)     Status: None   Collection Time: 02/16/24  6:34 PM  Result Value Ref Range   Vit D, 25-Hydroxy 54.47 30 - 100 ng/mL    Comment: (NOTE) Vitamin D  deficiency has been defined by the Institute of Medicine  and an Endocrine Society practice guideline as a level of serum 25-OH  vitamin D  less than 20 ng/mL (1,2). The Endocrine Society went on to  further define vitamin D  insufficiency as a level between 21 and 29  ng/mL (2).  1. IOM (Institute of Medicine). 2010. Dietary reference intakes for  calcium  and D. [place]: [employer]. 2. Holick MF, Binkley [place], Bischoff-Ferrari HA, et al. Evaluation,  treatment, and prevention of vitamin D  deficiency: an Endocrine  Society clinical practice guideline, JCEM. 2011 Jul; 96(7):  1911-30.  Performed at [facility], [address] [place], [place] [address]   RPR     Status: None   Collection Time: 02/16/24  6:34 PM  Result Value Ref Range   RPR Ser Ql NON REACTIVE NON REACTIVE    Comment: Performed at [facility], [address] [place], [place] [address]  HIV Antibody (routine testing w rflx)     Status: None   Collection Time: 02/16/24  6:34 PM  Result Value Ref Range   HIV Screen 4th Generation wRfx Non Reactive Non Reactive    Comment: (NOTE) HIV-1/HIV-2 antibodies and HIV-1 p24 antigen were NOT detected. There is no laboratory evidence of HIV infection. HIV Negative Performed At: [facility] [address] [place], [place] [id number] Pearlean Botts MD UY:[id number] CORRECTED ON 05/28 AT 0335: PREVIOUSLY REPORTED AS Reactive REPEATED TO VERIFY     Blood Alcohol level:  Lab Results  Component Value Date   [facility] <15 02/15/2024   ETH <10 09/03/2023    Metabolic Disorder Labs: Lab Results  Component Value Date   HGBA1C 5.2 02/15/2024   MPG 102.54 02/15/2024   MPG 114.02 07/17/2023   Lab Results  Component Value Date   PROLACTIN 40.3 (H) 12/06/2022   Lab Results  Component Value Date   CHOL 198 02/15/2024   TRIG 83 02/15/2024   HDL 47 02/15/2024   CHOLHDL 4.2 02/15/2024   VLDL 17 02/15/2024   LDLCALC 134 (H) 02/15/2024   LDLCALC 101 (H) 07/17/2023    Physical Findings: AIMS:  , ,  ,  ,    CIWA:    COWS:     Psychiatric Specialty Exam:  Presentation  General Appearance: Disheveled  Eye Contact: Fair  Speech: Normal Rate; Clear and Coherent  Speech Volume: Decreased  Handedness: Right   Mood and Affect  Mood: Anxious; Euphoric  Affect: Inappropriate   Thought Process  Thought Processes: Coherent  Descriptions of Associations: Intact  Orientation: Partial  Thought Content: Logical; Scattered  History of Schizophrenia/Schizoaffective disorder: Yes  Duration of Psychotic Symptoms:  NA Hallucinations: Hallucinations: None  Ideas of Reference: None  Suicidal Thoughts: Suicidal Thoughts: No  Homicidal Thoughts: Homicidal Thoughts: No   Sensorium  Memory: Immediate Fair; Recent Fair  Judgment: Poor  Insight: Poor   Executive Functions  Concentration: Fair  Attention Span: Fair  Recall: Fair  Fund of Knowledge: Good  Language: Good   Psychomotor Activity  Psychomotor Activity: Psychomotor Activity: Normal   Assets  Assets: Communication Skills; Social Support; Housing   Sleep  Sleep: Sleep: Good Number of Hours of Sleep: 8.5   Musculoskeletal: Strength & Muscle Tone: within normal limits Gait & Station: normal Patient leans: N/A   Physical Exam: General: Sitting comfortably. NAD. HEENT: Normocephalic, atraumatic, MMM, EMOI Lungs: no increased work of breathing noted Heart: no cyanosis Abdomen: Non distended Musculoskeletal: FROM. No obvious deformities Skin: Warm, dry, intact. No rashes noted Neuro: No obvious focal deficits.  Gait and station are normal  Review of Systems  Constitutional: Negative.   HENT: Negative.    Eyes: Negative.   Respiratory: Negative.    Cardiovascular: Negative.   Gastrointestinal: Negative.   Genitourinary: Negative.   Skin: Negative.   Neurological: Negative.   Psychiatric/Behavioral:  Positive for disorganized delusional thinking.     Blood pressure 122/77, pulse 69, temperature 97.8 F (36.6 C), temperature source Oral, resp. rate 18, height 5\' 11"  (1.803 m), weight 92.1 kg, SpO2 100%. Body mass index is 28.31 kg/m.  ASSESSMENT: Gasper E Caldera is an 28 y.o. male who  has a past medical history of ADHD, Bipolar 1 disorder (HCC), Schizoaffective disorder (HCC), Scoliosis, and Seizures (HCC).  He presented on 02/16/2024  8:06 AM for acute decompensated Schizoaffective disorder (HCC) in the setting of cannabis use and medication noncompliance (Depakote ).  He did receive an LAI on May 1.  He  received Invega  Sustenna 234 mg on 5/26.  Diagnoses / Active Problems: Patient Active Problem List   Diagnosis Date Noted   Schizoaffective disorder (HCC) 02/16/2024   Hallucinations 09/04/2023   Increased ammonia level 08/06/2023   Bipolar affective disorder, current episode manic with psychotic symptoms (HCC) 07/16/2023   Bipolar I disorder, severe, current or most recent episode manic, with psychotic features (HCC) 01/28/2023   Delta-9-tetrahydrocannabinol (THC) dependence (HCC) 01/28/2023   GAD (generalized anxiety disorder) 01/28/2023   Insomnia 01/28/2023   Bipolar disorder (HCC) 12/04/2022   Agitation 12/03/2022   Suicidal behavior with attempted self-injury (HCC) 11/26/2022   Suicidal ideation 11/26/2022   Unspecified intellectual disabilities 08/09/2020   Family discord 03/15/2020   Aggressive behavior 03/15/2020   HCAP (healthcare-associated pneumonia) 12/27/2019   Status epilepticus (HCC) 12/24/2019   Acute respiratory failure with hypoxemia (HCC)    Seizures (HCC) 07/10/2018   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 Psychiatric Diagnoses and Treatment:  Patient Active Problem List   Diagnosis Date Noted   Schizoaffective disorder (HCC) 02/16/2024   Hallucinations 09/04/2023   Increased ammonia level 08/06/2023   Bipolar affective disorder, current episode manic with psychotic symptoms  (HCC) 07/16/2023   Bipolar I disorder, severe, current or most recent episode manic, with psychotic features (HCC) 01/28/2023   Delta-9-tetrahydrocannabinol (THC) dependence (HCC) 01/28/2023   GAD (generalized anxiety disorder) 01/28/2023   Insomnia 01/28/2023   Bipolar disorder (HCC) 12/04/2022   Agitation 12/03/2022   Suicidal behavior with attempted self-injury (HCC) 11/26/2022   Suicidal ideation 11/26/2022   Unspecified intellectual disabilities 08/09/2020   Family discord 03/15/2020   Aggressive behavior 03/15/2020   HCAP (healthcare-associated pneumonia) 12/27/2019   Status epilepticus (HCC) 12/24/2019   Acute respiratory failure with hypoxemia (HCC)    Seizures (HCC) 07/10/2018   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   Noncompliance with medication regimen 12/11/2017   Seizure (HCC) 05/24/2016   Dehydration, mild 05/24/2016   Marijuana use 05/24/2016     Scheduled Medications:  divalproex   750 mg Oral Q12H   Eslicarbazepine Acetate   1,600 mg Oral Daily   levOCARNitine   1,000 mg Oral TID   paliperidone   3 mg Oral Daily   traZODone   50 mg Oral QHS   zonisamide   300 mg Oral Daily    As Needed Medications: acetaminophen , alum & mag hydroxide-simeth, haloperidol  **AND** diphenhydrAMINE , haloperidol  lactate **AND** diphenhydrAMINE  **AND** LORazepam , haloperidol  lactate **AND** diphenhydrAMINE  **AND** LORazepam , hydrOXYzine , magnesium  hydroxide    3. Medical Issues Being Addressed:   -- Seizure disorder as above  Labs reviewed, unremarkable with the exception of: LDL 134, HIV antibody reactive, UDS positive for cannabis   4. Discharge Planning:   -- Social work and case management to assist with discharge planning and identification of hospital follow-up needs prior to discharge  -- Estimated LOS: 7 to 10 days  -- Discharge Concerns: Need to establish a safety plan; Medication compliance and effectiveness  -- Discharge Goals:  Return home with outpatient referrals for mental health follow-up including medication management/psychotherapy  5. Short Term Goals:  Improve ability to identify changes in lifestyle to reduce recurrence of condition, verbalize feelings, disclose and discuss suicidal ideas, demonstrate self-control, identify and develop effective coping behaviors, compliance with prescribed medications, identify triggers associated with substance abuse/mental health issues, participate in unit milieu and in scheduled group therapies   6. Long Term Goals: Improvement in symptoms so the patient is ready for discharge   --The risks/benefits/side-effects/alternatives to the medications above were discussed in detail with the patient and time was given for questions. The patient provided informed consent.   -- Metabolic profile and EKG monitoring obtained while on an atypical antipsychotic and listed in the EHR    Total Time Spent in Direct Patient Care:  I personally spent 35 minutes on the unit in direct patient care. The direct patient care time included face-to-face time with the patient, reviewing the patient's chart, communicating with other professionals, and coordinating care. Greater than 50% of this time was spent in counseling or coordinating care with the patient regarding goals of hospitalization, psycho-education, and discharge planning needs.      Clair Crews, MD Psychiatrist  02/18/2024, 12:48 PM   I certify that inpatient services furnished can  reasonably be expected to improve the patient's condition.    Portions of this note were created using voice recognition software. Minor syntax errors, grammatical content, spelling, or punctuation errors may have occurred unintentionally. Please notify the [facility] if the meaning of any statement is unclear.

## 2024-02-18 NOTE — Group Note (Unsigned)
 Therapy Group Note  Group Topic:Other  Group Date: 02/18/2024 Start Time: 1430 End Time: 1518 Facilitators: Filip Luten G, OT    Group OT session titled "Media Detox Challenge" was conducted with approximately 20 adolescent patients in the inpatient behavioral health unit. The group focused on exploring the impact of media/screen use on emotional regulation, occupational balance, and routine development. Patients were provided with structured handouts and guided through a step-by-step process that included individual reflection, small group planning, and large group discussion. Tasks included identifying current screen time habits, emotional effects of media use, and collaboratively designing a 24-hour "media detox" plan with non-screen-based alternatives.      Participation Level: {OT BHH Participation RUEAV:40981}   Participation Quality: {OT BHH Participation Quality:26268}   Behavior: {BHH OT Group Behavior:26269}   Speech/Thought Process: {BHH OT Speech/Thought Process:26270}   Affect/Mood: {OT BHH Affect/Mood:26271}   Insight: {OT BHH Insight:26272}   Judgement: {OT BHH Judgement:26272}   Individualization: *** was *** in their participation of group discussion/activity. *** identified  Modes of Intervention: {BHH MODES OF INTERVENTION:26273}  Patient Response to Interventions:  {BHH OT Patient Response to Interventions:26274}   Plan: Continue to engage patient in OT groups 2 - 3x/week.  02/18/2024  Lynnda Sas, OT

## 2024-02-18 NOTE — Progress Notes (Signed)
   02/18/24 0754  Psych Admission Type (Psych Patients Only)  Admission Status Involuntary  Psychosocial Assessment  Patient Complaints Anxiety  Eye Contact Fair  Facial Expression Animated  Affect Appropriate to circumstance  Speech Logical/coherent  Interaction Assertive  Motor Activity Other (Comment) (wdl)  Appearance/Hygiene Unremarkable  Behavior Characteristics Cooperative  Mood Pleasant  Thought Process  Coherency WDL  Content WDL  Delusions None reported or observed  Perception WDL  Hallucination None reported or observed  Judgment Poor  Confusion None  Danger to Self  Current suicidal ideation? Denies  Agreement Not to Harm Self Yes  Description of Agreement verbal  Danger to Others  Danger to Others None reported or observed

## 2024-02-18 NOTE — Plan of Care (Signed)
   Problem: Education: Goal: Knowledge of [facility] Education information/materials will improve Outcome: Progressing Goal: Emotional status will improve Outcome: Progressing Goal: Mental status will improve Outcome: Progressing Goal: Verbalization of understanding the information provided will improve Outcome: Progressing

## 2024-02-19 DIAGNOSIS — F250 Schizoaffective disorder, bipolar type: Secondary | ICD-10-CM | POA: Diagnosis not present

## 2024-02-19 LAB — HIV-1 RNA QUANT-NO REFLEX-BLD
HIV 1 RNA Quant: <20 {copies}/mL
LOG10 HIV-1 RNA: UNDETERMINED {Log_copies}/mL

## 2024-02-19 LAB — VALPROIC ACID LEVEL: Valproic Acid Lvl: 116 ug/mL — ABNORMAL HIGH (ref 50–100)

## 2024-02-19 NOTE — Progress Notes (Addendum)
   02/19/24 1200  Psych Admission Type (Psych Patients Only)  Admission Status Involuntary  Psychosocial Assessment  Patient Complaints Other (Comment) (Pt perseverating on discharge and had to be redirected.  Challenged pt to walk laps and he made an attempt. He denied SI/HI/AVH.)  Eye Contact Staring  Facial Expression Animated  Affect Sad (Ranging between bright and sad d/t waiting on discharge)  Speech Logical/coherent  Interaction Assertive  Appearance/Hygiene Unremarkable  Behavior Characteristics Cooperative  Thought Process  Coherency WDL  Content WDL  Delusions None reported or observed  Perception WDL  Hallucination None reported or observed  Judgment Poor  Confusion None  Danger to Self  Current suicidal ideation? Denies  Description of Suicide Plan n  Agreement Not to Harm Self Yes  Description of Agreement Verbal  Danger to Others  Danger to Others None reported or observed

## 2024-02-19 NOTE — Plan of Care (Signed)
  Problem: Education: Goal: Knowledge of [facility] Education information/materials will improve Outcome: Progressing Goal: Emotional status will improve Outcome: Progressing Goal: Mental status will improve Outcome: Progressing   Problem: Education: Goal: Knowledge of [place] [facility] Education information/materials will improve Outcome: Progressing Goal: Emotional status will improve Outcome: Progressing Goal: Mental status will improve Outcome: Progressing

## 2024-02-19 NOTE — Progress Notes (Signed)
 [facility] MD Progress Note  02/19/2024 1:02 PM [name]  MRN:  [id number] Principal Problem: Schizoaffective disorder (HCC) Diagnosis: Principal Problem:   Schizoaffective disorder (HCC)   ID & Admission Information: Dylan Moon is an 28 y.o. male who  has a past medical history of ADHD, Bipolar 1 disorder (HCC), Schizoaffective disorder (HCC), Scoliosis, and Seizures (HCC).  He presented on 02/16/2024  8:06 AM for decompensated Schizoaffective disorder (HCC), bipolar type in setting of medication noncompliance.   Subjective:   Case was discussed in the multidisciplinary team. MAR was reviewed and patient was compliant with medications.   PRN's in last 24 hours: None  Psychiatric Team made the following recommendations yesterday: Continue current medications  Juanantonio was seen in an interview room during rounds.  His affect remains inappropriate, and nursing noted paranoid behavior overnight.  He is illogical in conversation today, and tells me that he needs to go to [place] to get a sued for his grandfather's funeral.  He tells me that there are no men's clothing stores in [place].  I gave him a list of approximately 15 off of Google maps.  He remains focused on discharge.  The patient denies suicidal ideation, homicidal ideation, auditory hallucinations, or visual hallucinations.  He denies medication side effects.   I spoke with the patient's mother today, and she tells me that she does not think he is at his baseline.  She remains concerned about his smoking cannabis despite the fact that it triggers his paranoia, and about his gambling.  She tells me that the funeral was originally scheduled for Friday, but then rescheduled for Sunday at 2:00.    Past Psychiatric and Medical Medical History:  Past Medical History:  Diagnosis Date   ADHD    Bipolar 1 disorder (HCC)    Schizoaffective disorder (HCC)    Scoliosis    Seizures (HCC)    most recent 12/02/17    Past Surgical  History:  Procedure Laterality Date   NO PAST SURGERIES      Family History(Medical and Psychiatric):  Family History  Problem Relation Age of Onset   Diabetes Mother    Hypertension Mother    Cancer Other    Diabetes Father    Seizures Maternal Grandfather       Social History:  Social History   Substance and Sexual Activity  Alcohol Use Not Currently     Social History   Substance and Sexual Activity  Drug Use Yes   Types: Marijuana   Comment: Daily use of marijuana.    Social History   Socioeconomic History   Marital status: Single    Spouse name: Not on file   Number of children: 0   Years of education: HS   Highest education level: Not on file  Occupational History   Occupation: Product manager of videos  Tobacco Use   Smoking status: Some Days    Types: Cigars   Smokeless tobacco: Never  Vaping Use   Vaping status: Never Used  Substance and Sexual Activity   Alcohol use: Not Currently   Drug use: Yes    Types: Marijuana    Comment: Daily use of marijuana.   Sexual activity: Not Currently  Other Topics Concern   Not on file  Social History Narrative   Lives at home his mother.   3-4 sodas per week.   Right-handed.   Social Drivers of Corporate investment banker Strain: Not on file  Food Insecurity: No Food Insecurity (02/16/2024)  Hunger Vital Sign    Worried About Running Out of Food in the Last Year: Never true    Ran Out of Food in the Last Year: Never true  Transportation Needs: Unknown (02/16/2024)   PRAPARE - Administrator, Civil Service (Medical): No    Lack of Transportation (Non-Medical): Patient declined  Physical Activity: Not on file  Stress: Not on file  Social Connections: Not on file        Current Medications: Current Facility-Administered Medications  Medication Dose Route Frequency Provider Last Rate Last Admin   acetaminophen  (TYLENOL ) tablet 650 mg  650 mg Oral Q6H PRN Bobbitt, Shalon E, NP        alum & mag hydroxide-simeth (MAALOX/MYLANTA) 200-200-20 MG/5ML suspension 30 mL  30 mL Oral Q4H PRN Bobbitt, Shalon E, NP       haloperidol  (HALDOL ) tablet 5 mg  5 mg Oral TID PRN Bobbitt, Shalon E, NP       And   diphenhydrAMINE  (BENADRYL ) capsule 50 mg  50 mg Oral TID PRN Bobbitt, Shalon E, NP       haloperidol  lactate (HALDOL ) injection 5 mg  5 mg Intramuscular TID PRN Bobbitt, Shalon E, NP       And   diphenhydrAMINE  (BENADRYL ) injection 50 mg  50 mg Intramuscular TID PRN Bobbitt, Shalon E, NP       And   LORazepam  (ATIVAN ) injection 2 mg  2 mg Intramuscular TID PRN Bobbitt, Shalon E, NP       haloperidol  lactate (HALDOL ) injection 10 mg  10 mg Intramuscular TID PRN Bobbitt, Shalon E, NP       And   diphenhydrAMINE  (BENADRYL ) injection 50 mg  50 mg Intramuscular TID PRN Bobbitt, Shalon E, NP       And   LORazepam  (ATIVAN ) injection 2 mg  2 mg Intramuscular TID PRN Bobbitt, Shalon E, NP       divalproex  (DEPAKOTE ) DR tablet 750 mg  750 mg Oral Q12H Bobbitt, Shalon E, NP   750 mg at 02/19/24 0809   Eslicarbazepine Acetate  TABS 1,600 mg  1,600 mg Oral Daily Zouev, Dmitri, MD   1,600 mg at 02/19/24 1610   hydrOXYzine  (ATARAX ) tablet 25 mg  25 mg Oral TID PRN Bobbitt, Shalon E, NP       levOCARNitine  (CARNITOR ) 1 GM/10ML solution 1,000 mg  1,000 mg Oral TID Smiley Dung, RPH   1,000 mg at 02/19/24 1155   magnesium  hydroxide (MILK OF MAGNESIA) suspension 30 mL  30 mL Oral Daily PRN Bobbitt, Shalon E, NP       paliperidone  (INVEGA ) 24 hr tablet 3 mg  3 mg Oral Daily Hector Venne S, MD   3 mg at 02/19/24 0809   traZODone  (DESYREL ) tablet 50 mg  50 mg Oral QHS Yug Loria S, MD   50 mg at 02/18/24 2052   zonisamide  (ZONEGRAN ) capsule 300 mg  300 mg Oral Daily Zouev, Dmitri, MD   300 mg at 02/19/24 9604    Lab Results:  No results found for this or any previous visit (from the past 48 hours).   Blood Alcohol level:  Lab Results  Component Value Date   [facility] <15 02/15/2024   ETH <10  09/03/2023    Metabolic Disorder Labs: Lab Results  Component Value Date   HGBA1C 5.2 02/15/2024   MPG 102.54 02/15/2024   MPG 114.02 07/17/2023   Lab Results  Component Value Date   PROLACTIN 40.3 (H) 12/06/2022  Lab Results  Component Value Date   CHOL 198 02/15/2024   TRIG 83 02/15/2024   HDL 47 02/15/2024   CHOLHDL 4.2 02/15/2024   VLDL 17 02/15/2024   LDLCALC 134 (H) 02/15/2024   LDLCALC 101 (H) 07/17/2023    Physical Findings: AIMS:  , ,  ,  ,    CIWA:    COWS:     Psychiatric Specialty Exam:  Presentation  General Appearance: Fairly Groomed  Eye Contact: Fair  Speech: Clear and Coherent; Slow  Speech Volume: Decreased  Handedness: Right   Mood and Affect  Mood: Anxious; Euphoric  Affect: Inappropriate   Thought Process  Thought Processes: Disorganized; Goal Directed  Descriptions of Associations: Intact  Orientation: Partial  Thought Content: Illogical  History of Schizophrenia/Schizoaffective disorder: Yes  Duration of Psychotic Symptoms: NA Hallucinations: Hallucinations: None  Ideas of Reference: None  Suicidal Thoughts: Suicidal Thoughts: No  Homicidal Thoughts: Homicidal Thoughts: No   Sensorium  Memory: Immediate Good; Recent Good  Judgment: Poor  Insight: Poor   Executive Functions  Concentration: Fair  Attention Span: Fair  Recall: Fair  Fund of Knowledge: Good  Language: Good   Psychomotor Activity  Psychomotor Activity: Psychomotor Activity: Normal   Assets  Assets: Communication Skills; Social Support; Housing   Sleep  Sleep: Sleep: Good   Musculoskeletal: Strength & Muscle Tone: within normal limits Gait & Station: normal Patient leans: N/A   Physical Exam: General: Sitting comfortably. NAD. HEENT: Normocephalic, atraumatic, MMM, EMOI Lungs: no increased work of breathing noted Heart: no cyanosis Abdomen: Non distended Musculoskeletal: FROM. No obvious deformities Skin: Warm, dry,  intact. No rashes noted Neuro: No obvious focal deficits.  Gait and station are normal  Review of Systems  Constitutional: Negative.   HENT: Negative.    Eyes: Negative.   Respiratory: Negative.    Cardiovascular: Negative.   Gastrointestinal: Negative.   Genitourinary: Negative.   Skin: Negative.   Neurological: Negative.   Psychiatric/Behavioral:  Positive for disorganized delusional thinking.     Blood pressure 127/78, pulse 62, temperature (!) 97.5 F (36.4 C), temperature source Oral, resp. rate 19, height 5\' 11"  (1.803 m), weight 92.1 kg, SpO2 100%. Body mass index is 28.31 kg/m.  ASSESSMENT: Dylan Moon is an 28 y.o. male who  has a past medical history of ADHD, Bipolar 1 disorder (HCC), Schizoaffective disorder (HCC), Scoliosis, and Seizures (HCC).  He presented on 02/16/2024  8:06 AM for acute decompensated Schizoaffective disorder (HCC) in the setting of cannabis use and medication noncompliance (Depakote ).  He did receive an LAI on May 1.  He received Invega  Sustenna 234 mg on 5/26.  Diagnoses / Active Problems: Patient Active Problem List   Diagnosis Date Noted   Schizoaffective disorder (HCC) 02/16/2024   Hallucinations 09/04/2023   Increased ammonia level 08/06/2023   Bipolar affective disorder, current episode manic with psychotic symptoms (HCC) 07/16/2023   Bipolar I disorder, severe, current or most recent episode manic, with psychotic features (HCC) 01/28/2023   Delta-9-tetrahydrocannabinol (THC) dependence (HCC) 01/28/2023   GAD (generalized anxiety disorder) 01/28/2023   Insomnia 01/28/2023   Bipolar disorder (HCC) 12/04/2022   Agitation 12/03/2022   Suicidal behavior with attempted self-injury (HCC) 11/26/2022   Suicidal ideation 11/26/2022   Unspecified intellectual disabilities 08/09/2020   Family discord 03/15/2020   Aggressive behavior 03/15/2020   HCAP (healthcare-associated pneumonia) 12/27/2019   Status epilepticus (HCC) 12/24/2019   Acute  respiratory failure with hypoxemia (HCC)    Seizures (HCC) 07/10/2018   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 Psychiatric Diagnoses and Treatment:  Patient Active Problem List   Diagnosis Date Noted   Schizoaffective disorder (HCC) 02/16/2024   Hallucinations 09/04/2023   Increased ammonia level 08/06/2023   Bipolar affective disorder, current episode manic with psychotic symptoms (HCC) 07/16/2023   Bipolar I disorder, severe, current or most recent episode manic, with psychotic features (HCC) 01/28/2023   Delta-9-tetrahydrocannabinol (THC) dependence (HCC) 01/28/2023   GAD (generalized anxiety disorder) 01/28/2023   Insomnia 01/28/2023   Bipolar disorder (HCC) 12/04/2022   Agitation 12/03/2022   Suicidal behavior with attempted self-injury (HCC) 11/26/2022   Suicidal ideation 11/26/2022   Unspecified intellectual disabilities 08/09/2020   Family discord 03/15/2020   Aggressive behavior 03/15/2020   HCAP (healthcare-associated pneumonia) 12/27/2019   Status epilepticus (HCC) 12/24/2019   Acute respiratory failure with hypoxemia (HCC)    Seizures (HCC) 07/10/2018   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   Noncompliance with medication regimen 12/11/2017   Seizure (HCC) 05/24/2016   Dehydration, mild 05/24/2016   Marijuana use 05/24/2016     Scheduled  Medications:  divalproex   750 mg Oral Q12H   Eslicarbazepine Acetate   1,600 mg Oral Daily   levOCARNitine   1,000 mg Oral TID   paliperidone   3 mg Oral Daily   traZODone   50 mg Oral QHS   zonisamide   300 mg Oral Daily    As Needed Medications: acetaminophen , alum & mag hydroxide-simeth, haloperidol  **AND** diphenhydrAMINE , haloperidol  lactate **AND** diphenhydrAMINE  **AND** LORazepam , haloperidol  lactate **AND** diphenhydrAMINE  **AND** LORazepam , hydrOXYzine , magnesium  hydroxide    3. Medical Issues Being Addressed:   -- Seizure disorder as above  Labs reviewed, unremarkable with the exception of: LDL 134, HIV antibody reactive, UDS positive for cannabis   4. Discharge Planning:   -- Social work and case management to assist with discharge planning and identification of hospital follow-up needs prior to discharge  -- Estimated LOS: 7 to 10 days  -- Discharge Concerns: Need to establish a safety plan; Medication compliance and effectiveness  -- Discharge Goals: Return home with outpatient referrals for mental health follow-up including medication management/psychotherapy  5. Short Term Goals:  Improve ability to identify changes in lifestyle to reduce recurrence of condition, verbalize feelings, disclose and discuss suicidal ideas, demonstrate self-control, identify and develop effective coping behaviors, compliance with prescribed medications, identify triggers associated with substance abuse/mental health issues, participate in unit milieu and in scheduled group therapies   6. Long Term Goals: Improvement in symptoms so the patient is ready for discharge   --The risks/benefits/side-effects/alternatives to the medications above were discussed in detail with the patient and time was given for questions. The patient provided informed consent.   -- Metabolic profile and EKG monitoring obtained while on an atypical antipsychotic and listed in the EHR    Total Time Spent in Direct  Patient Care:  I personally spent 35 minutes on the unit in direct patient care.  The direct patient care time included face-to-face time with the patient, reviewing the patient's chart, communicating with other professionals, and coordinating care. Greater than 50% of this time was spent in counseling or coordinating care with the patient regarding goals of hospitalization, psycho-education, and discharge planning needs.      Clair Crews, MD Psychiatrist  02/19/2024, 1:02 PM   I certify that inpatient services furnished can reasonably be expected to improve the patient's condition.    Portions of this note were created using voice recognition software. Minor syntax errors, grammatical content, spelling, or punctuation errors may have occurred unintentionally. Please notify the [facility] if the meaning of any statement is unclear.

## 2024-02-19 NOTE — Group Note (Signed)
 LCSW Group Therapy Note   Group Date: 02/19/2024 Start Time: 1100 End Time: 1200   Participation:  patient attended 50% of the group session.  Patient was respectful and somewhat participated in the discussion.  Type of Therapy:  Group Therapy  Topic:  Finding Balance: Using Wise Mind for Thoughtful Decisions  Objective:  To help participants understand and apply the concept of Agustina Aldrich Mind to make balanced, thoughtful decisions by integrating emotion and logic.  Goals: Learn the differences between Emotional Mind, Reasonable Mind, and [employer]. Recognize personal signs of Emotional and Reasonable Mind. Practice using [employer] in real-life scenarios.  Therapeutic Modalities: Elements of Dialectical Behavior Therapy (DBT):  Mindfulness (noticing thoughts and emotions without judgment), Emotion Regulation (understanding and managing emotional responses), Distress Tolerance (coping with difficult situations without making them worse), Wise Mind (integrating emotion and reason for balanced decision-making) Elements of Cognitive Behavioral Therapy (CBT):  Identifying automatic thoughts, Challenging cognitive distortions, Using logic to reframe unhelpful thinking patterns  Summary:  This class focused on Wise Mind - DBT's concept of balancing Emotional Mind and Reasonable Mind. We identified when we're in each state and practiced using Wise Mind to respond thoughtfully in real-life situations. By combining emotion and logic, participants can improve decision-making, manage challenges, and enhance relationships.   Orrie Lascano O Camary Sosa, LCSWA 02/19/2024  6:14 PM

## 2024-02-19 NOTE — [facility] Group Notes (Signed)
 Spirituality Group   Description: Participant directed exploration of values, beliefs and meaning   Following a brief framework of chaplain's role and ground rules of group behavior, participants are invited to share concerns or questions that engage spiritual life. Emphasis placed on common themes and shared experiences and ways to make meaning and clarify living into one's values.   Theory/Process/Goal: Utilize the theoretical framework of group therapy established by Derrell Flight, Relational Cultural Theory and Rogerian approaches to facilitate relational empathy and use of the "here and now" to foster reflection, self-awareness, and sharing.   Observations: Dylan Moon attended the second half of group. He was reserved but was clearly engaged in the conversation.  [name] L. Minetta Aly, M.Div [phone]

## 2024-02-19 NOTE — Progress Notes (Signed)
   02/19/24 2200  Psych Admission Type (Psych Patients Only)  Admission Status Involuntary  Psychosocial Assessment  Patient Complaints None  Eye Contact Fair  Facial Expression Animated  Affect Appropriate to circumstance  Speech Logical/coherent  Interaction Assertive  Motor Activity Other (Comment) (appropriate)  Appearance/Hygiene Unremarkable  Behavior Characteristics Cooperative  Mood Pleasant  Thought Process  Coherency WDL  Content WDL  Delusions None reported or observed  Perception WDL  Hallucination None reported or observed  Judgment Poor  Confusion None  Danger to Self  Current suicidal ideation? Denies  Agreement Not to Harm Self Yes  Description of Agreement verbal  Danger to Others  Danger to Others None reported or observed

## 2024-02-19 NOTE — Group Note (Signed)
 Date:  02/19/2024 Time:  9:12 AM  Group Topic/Focus:  Goals Group:   The focus of this group is to help patients establish daily goals to achieve during treatment and discuss how the patient can incorporate goal setting into their daily lives to aide in recovery.    Participation Level:  Did Not Attend   Dylan Moon 02/19/2024, 9:12 AM

## 2024-02-19 NOTE — Group Note (Signed)
 Date:  02/19/2024 Time:  9:06 PM  Group Topic/Focus:  Wrap-Up Group:   The focus of this group is to help patients review their daily goal of treatment and discuss progress on daily workbooks.    Participation Level:  Active  Participation Quality:  Attentive and Intrusive  Affect:  Excited  Cognitive:  Alert  Insight: Appropriate  Engagement in Group:  Distracting and Monopolizing  Modes of Intervention:  Discussion, Education, and Limit-setting  Additional Comments:  Pt attended and participated in wrap up group this evenign and rated their day a 7/10. Pt stated that they went to groups but states that not being D/C today negatively affected their day. Pt mentioned that there was a "misunderstanding" with their mother that caused their admission. Pt stated that they were able to speak to their mother to discuss this "misunderstanding". Pt anticipates being D/C by Sunday in order to attend their loved one's funeral. Pt complains of the lighting in the hospital effected their epilepsy. When asked if they wear sunglasses to help, they responded no. Pt has no other complaints at this time.   Eligah Grow 02/19/2024, 9:06 PM

## 2024-02-20 ENCOUNTER — Encounter (HOSPITAL_COMMUNITY): Payer: Self-pay

## 2024-02-20 DIAGNOSIS — F250 Schizoaffective disorder, bipolar type: Secondary | ICD-10-CM | POA: Diagnosis not present

## 2024-02-20 NOTE — Group Note (Signed)
 Date:  02/20/2024 Time:  9:08 AM  Group Topic/Focus:  Goals Group:   The focus of this group is to help patients establish daily goals to achieve during treatment and discuss how the patient can incorporate goal setting into their daily lives to aide in recovery.    Participation Level:  Did Not Attend   Dylan Moon 02/20/2024, 9:08 AM

## 2024-02-20 NOTE — Group Note (Signed)
 Recreation Therapy Group Note   Group Topic:Team Building  Group Date: 02/20/2024 Start Time: 0940 End Time: 1005 Facilitators: Samer Dutton-McCall, LRT,CTRS Location: [address] Dayroom   Group Topic: Communication, Team Building, Problem Solving  Goal Area(s) Addresses:  Patient will effectively work with peer towards shared goal.  Patient will identify skills used to make activity successful.  Patient will identify how skills used during activity can be used to reach post d/c goals.   Behavioral Response:   Intervention: STEM Activity  Activity: Straw Bridge. In teams of 3-5, patients were given 15 plastic drinking straws and an equal length of masking tape. Using the materials provided, patients were instructed to build a free standing bridge-like structure to suspend an everyday item (ex: puzzle box) off of the floor or table surface. All materials were required to be used by the team in their design. LRT facilitated post-activity discussion reviewing team process. Patients were encouraged to reflect how the skills used in this activity can be generalized to daily life post discharge.   Education: Pharmacist, community, Scientist, physiological, Discharge Planning   Education Outcome: Acknowledges education/In group clarification offered/Needs additional education.    Affect/Mood: N/A   Participation Level: Did not attend    Clinical Observations/Individualized Feedback:     Plan: Continue to engage patient in RT group sessions 2-3x/week.   Andreus Cure-McCall, LRT,CTRS  02/20/2024 12:25 PM

## 2024-02-20 NOTE — Progress Notes (Signed)
   02/20/24 1610  15 Minute Checks  Location Bedroom  Visual Appearance Calm  Behavior Sleeping  Sleep (Behavioral Health Patients Only)  Calculate sleep? (Click Yes once per 24 hr at 0600 safety check) Yes  Documented sleep last 24 hours 8.5

## 2024-02-20 NOTE — Group Note (Signed)
 Date:  02/20/2024 Time:  8:50 PM  Group Topic/Focus:  Wrap-Up Group:   The focus of this group is to help patients review their daily goal of treatment and discuss progress on daily workbooks.    Additional Comments:  Pt attended the AA meeting this evening.   Eligah Grow 02/20/2024, 8:50 PM

## 2024-02-20 NOTE — Group Note (Signed)
 Date:  02/20/2024 Time:  9:44 PM  Group Topic/Focus:  Wrap-Up Group:   The focus of this group is to help patients review their daily goal of treatment and discuss progress on daily workbooks.    Participation Level:  Active  Participation Quality:  Appropriate  Affect:  Appropriate  Cognitive:  Appropriate  Insight: Appropriate  Engagement in Group:  Engaged  Modes of Intervention:  Discussion  Additional Comments:  Pt ha a good day  Dellia Ferguson 02/20/2024, 9:44 PM

## 2024-02-20 NOTE — Progress Notes (Signed)
 [facility] MD Progress Note  02/20/2024 1:52 PM Dylan Moon  MRN:  [id number] Principal Problem: Schizoaffective disorder (HCC) Diagnosis: Principal Problem:   Schizoaffective disorder (HCC)   ID & Admission Information: Dylan Moon is an 28 y.o. male who  has a past medical history of ADHD, Bipolar 1 disorder (HCC), Schizoaffective disorder (HCC), Scoliosis, and Seizures (HCC).  He presented on 02/16/2024  8:06 AM for decompensated Schizoaffective disorder (HCC), bipolar type in setting of medication noncompliance.   Subjective:   Case was discussed in the multidisciplinary team. MAR was reviewed and patient was compliant with medications.   PRN's in last 24 hours: None  Psychiatric Team made the following recommendations yesterday: Continue current medications  Captain was seen in his room during rounds.  He reported that his mood was "good" today.  He denies any new psychiatric or medical complaints.  He continues to present as mildly disorganized in thought and illogical, but is pleasant.  He reports that his appetite is good.  He reports that focus and concentration are adequate.  He denies issues with energy.  He reports adequate sleep.  He denies any medication side effects.  He denies suicidal ideations, homicidal ideations, auditory hallucinations, visual hallucinations, or delusions.  If he continues to improve we will plan on discharging tomorrow.  Depakote  level was therapeutic.     Past Psychiatric and Medical Medical History:  Past Medical History:  Diagnosis Date   ADHD    Bipolar 1 disorder (HCC)    Schizoaffective disorder (HCC)    Scoliosis    Seizures (HCC)    most recent 12/02/17    Past Surgical History:  Procedure Laterality Date   NO PAST SURGERIES      Family History(Medical and Psychiatric):  Family History  Problem Relation Age of Onset   Diabetes Mother    Hypertension Mother    Cancer Other    Diabetes Father    Seizures Maternal Grandfather        Social History:  Social History   Substance and Sexual Activity  Alcohol Use Not Currently     Social History   Substance and Sexual Activity  Drug Use Yes   Types: Marijuana   Comment: Daily use of marijuana.    Social History   Socioeconomic History   Marital status: Single    Spouse name: Not on file   Number of children: 0   Years of education: HS   Highest education level: Not on file  Occupational History   Occupation: Product manager of videos  Tobacco Use   Smoking status: Some Days    Types: Cigars   Smokeless tobacco: Never  Vaping Use   Vaping status: Never Used  Substance and Sexual Activity   Alcohol use: Not Currently   Drug use: Yes    Types: Marijuana    Comment: Daily use of marijuana.   Sexual activity: Not Currently  Other Topics Concern   Not on file  Social History Narrative   Lives at home his mother.   3-4 sodas per week.   Right-handed.   Social Drivers of Corporate investment banker Strain: Not on file  Food Insecurity: No Food Insecurity (02/16/2024)   Hunger Vital Sign    Worried About Running Out of Food in the Last Year: Never true    Ran Out of Food in the Last Year: Never true  Transportation Needs: Unknown (02/16/2024)   PRAPARE - Administrator, Civil Service (Medical): No  Lack of Transportation (Non-Medical): Patient declined  Physical Activity: Not on file  Stress: Not on file  Social Connections: Not on file        Current Medications: Current Facility-Administered Medications  Medication Dose Route Frequency Provider Last Rate Last Admin   acetaminophen  (TYLENOL ) tablet 650 mg  650 mg Oral Q6H PRN Bobbitt, Shalon E, NP       alum & mag hydroxide-simeth (MAALOX/MYLANTA) 200-200-20 MG/5ML suspension 30 mL  30 mL Oral Q4H PRN Bobbitt, Shalon E, NP       haloperidol  (HALDOL ) tablet 5 mg  5 mg Oral TID PRN Bobbitt, Shalon E, NP       And   diphenhydrAMINE  (BENADRYL ) capsule 50 mg  50 mg  Oral TID PRN Bobbitt, Shalon E, NP       haloperidol  lactate (HALDOL ) injection 5 mg  5 mg Intramuscular TID PRN Bobbitt, Shalon E, NP       And   diphenhydrAMINE  (BENADRYL ) injection 50 mg  50 mg Intramuscular TID PRN Bobbitt, Shalon E, NP       And   LORazepam  (ATIVAN ) injection 2 mg  2 mg Intramuscular TID PRN Bobbitt, Shalon E, NP       haloperidol  lactate (HALDOL ) injection 10 mg  10 mg Intramuscular TID PRN Bobbitt, Shalon E, NP       And   diphenhydrAMINE  (BENADRYL ) injection 50 mg  50 mg Intramuscular TID PRN Bobbitt, Shalon E, NP       And   LORazepam  (ATIVAN ) injection 2 mg  2 mg Intramuscular TID PRN Bobbitt, Shalon E, NP       divalproex  (DEPAKOTE ) DR tablet 750 mg  750 mg Oral Q12H Bobbitt, Shalon E, NP   750 mg at 02/20/24 0981   Eslicarbazepine Acetate  TABS 1,600 mg  1,600 mg Oral Daily Zouev, Dmitri, MD   1,600 mg at 02/20/24 0744   hydrOXYzine  (ATARAX ) tablet 25 mg  25 mg Oral TID PRN Bobbitt, Shalon E, NP       levOCARNitine  (CARNITOR ) 1 GM/10ML solution 1,000 mg  1,000 mg Oral TID Smiley Dung, RPH   1,000 mg at 02/20/24 0744   magnesium  hydroxide (MILK OF MAGNESIA) suspension 30 mL  30 mL Oral Daily PRN Bobbitt, Shalon E, NP       paliperidone  (INVEGA ) 24 hr tablet 3 mg  3 mg Oral Daily Cleatis Fandrich S, MD   3 mg at 02/20/24 1914   traZODone  (DESYREL ) tablet 50 mg  50 mg Oral QHS Teesha Ohm S, MD   50 mg at 02/19/24 2101   zonisamide  (ZONEGRAN ) capsule 300 mg  300 mg Oral Daily Zouev, Dmitri, MD   300 mg at 02/20/24 7829    Lab Results:  Results for orders placed or performed during the hospital encounter of 02/16/24 (from the past 48 hours)  Valproic  acid level     Status: Abnormal   Collection Time: 02/19/24  6:21 PM  Result Value Ref Range   Valproic  Acid Lvl 116 (H) 50 - 100 ug/mL    Comment: Performed at [facility], [address] [place], [place] [address]     Blood Alcohol level:  Lab Results  Component Value Date   [facility] <15  02/15/2024   ETH <10 09/03/2023    Metabolic Disorder Labs: Lab Results  Component Value Date   HGBA1C 5.2 02/15/2024   MPG 102.54 02/15/2024   MPG 114.02 07/17/2023   Lab Results  Component Value Date  PROLACTIN 40.3 (H) 12/06/2022   Lab Results  Component Value Date   CHOL 198 02/15/2024   TRIG 83 02/15/2024   HDL 47 02/15/2024   CHOLHDL 4.2 02/15/2024   VLDL 17 02/15/2024   LDLCALC 134 (H) 02/15/2024   LDLCALC 101 (H) 07/17/2023    Physical Findings: AIMS:  , ,  ,  ,    CIWA:    COWS:     Psychiatric Specialty Exam:  Presentation  General Appearance: Appropriate for Environment  Eye Contact: Good  Speech: Clear and Coherent; Normal Rate  Speech Volume: Normal  Handedness: Right   Mood and Affect  Mood: Euthymic  Affect: Congruent   Thought Process  Thought Processes: Linear  Descriptions of Associations: Intact  Orientation: Full (Time, Place and Person)  Thought Content: Logical  History of Schizophrenia/Schizoaffective disorder: No  Duration of Psychotic Symptoms: NA Hallucinations: Hallucinations: None  Ideas of Reference: None  Suicidal Thoughts: Suicidal Thoughts: No  Homicidal Thoughts: Homicidal Thoughts: No   Sensorium  Memory: Immediate Good  Judgment: Fair  Insight: Fair   Art therapist  Concentration: Good  Attention Span: Good  Recall: Good  Fund of Knowledge: Good  Language: Good   Psychomotor Activity  Psychomotor Activity: Psychomotor Activity: Normal   Assets  Assets: Communication Skills; Social Support; Housing; Leisure Time   Sleep  Sleep: Sleep: Good   Musculoskeletal: Strength & Muscle Tone: within normal limits Gait & Station: normal Patient leans: N/A   Physical Exam: General: Sitting comfortably. NAD. HEENT: Normocephalic, atraumatic, MMM, EMOI Lungs: no increased work of breathing noted Heart: no cyanosis Abdomen: Non distended Musculoskeletal: FROM. No obvious  deformities Skin: Warm, dry, intact. No rashes noted Neuro: No obvious focal deficits.  Gait and station are normal  Review of Systems  Constitutional: Negative.   HENT: Negative.    Eyes: Negative.   Respiratory: Negative.    Cardiovascular: Negative.   Gastrointestinal: Negative.   Genitourinary: Negative.   Skin: Negative.   Neurological: Negative.   Psychiatric/Behavioral:  Positive for disorganized thoughts   Blood pressure 129/84, pulse 72, temperature 98.3 F (36.8 C), temperature source Oral, resp. rate 20, height 5\' 11"  (1.803 m), weight 92.1 kg, SpO2 100%. Body mass index is 28.31 kg/m.  ASSESSMENT: Eragon E Nobbe is an 28 y.o. male who  has a past medical history of ADHD, Bipolar 1 disorder (HCC), Schizoaffective disorder (HCC), Scoliosis, and Seizures (HCC).  He presented on 02/16/2024  8:06 AM for acute decompensated Schizoaffective disorder (HCC) in the setting of cannabis use and medication noncompliance (Depakote ).  He did receive an LAI on May 1.  He received Invega  Sustenna 234 mg on 5/26.  Diagnoses / Active Problems: Patient Active Problem List   Diagnosis Date Noted   Schizoaffective disorder (HCC) 02/16/2024   Hallucinations 09/04/2023   Increased ammonia level 08/06/2023   Bipolar affective disorder, current episode manic with psychotic symptoms (HCC) 07/16/2023   Bipolar I disorder, severe, current or most recent episode manic, with psychotic features (HCC) 01/28/2023   Delta-9-tetrahydrocannabinol (THC) dependence (HCC) 01/28/2023   GAD (generalized anxiety disorder) 01/28/2023   Insomnia 01/28/2023   Bipolar disorder (HCC) 12/04/2022   Agitation 12/03/2022   Suicidal behavior with attempted self-injury (HCC) 11/26/2022   Suicidal ideation 11/26/2022   Unspecified intellectual disabilities 08/09/2020   Family discord 03/15/2020   Aggressive behavior 03/15/2020   HCAP (healthcare-associated pneumonia) 12/27/2019   Status epilepticus (HCC) 12/24/2019    Acute respiratory failure with hypoxemia (HCC)    Seizures (HCC)  07/10/2018   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 Psychiatric Diagnoses and Treatment:  Patient Active Problem List   Diagnosis Date Noted   Schizoaffective disorder (HCC) 02/16/2024   Hallucinations 09/04/2023   Increased ammonia level 08/06/2023   Bipolar affective disorder, current episode manic with psychotic symptoms (HCC) 07/16/2023   Bipolar I disorder, severe, current or most recent episode manic, with psychotic features (HCC) 01/28/2023   Delta-9-tetrahydrocannabinol (THC) dependence (HCC) 01/28/2023   GAD (generalized anxiety disorder) 01/28/2023   Insomnia 01/28/2023   Bipolar disorder (HCC) 12/04/2022   Agitation 12/03/2022   Suicidal behavior with attempted self-injury (HCC) 11/26/2022   Suicidal ideation 11/26/2022   Unspecified intellectual disabilities 08/09/2020   Family discord 03/15/2020   Aggressive behavior 03/15/2020   HCAP (healthcare-associated pneumonia) 12/27/2019   Status epilepticus (HCC) 12/24/2019   Acute respiratory failure with hypoxemia (HCC)    Seizures (HCC) 07/10/2018   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   Noncompliance with medication regimen 12/11/2017   Seizure (HCC) 05/24/2016   Dehydration, mild 05/24/2016   Marijuana use 05/24/2016      Scheduled Medications:  divalproex   750 mg Oral Q12H   Eslicarbazepine Acetate   1,600 mg Oral Daily   levOCARNitine   1,000 mg Oral TID   paliperidone   3 mg Oral Daily   traZODone   50 mg Oral QHS   zonisamide   300 mg Oral Daily    As Needed Medications: acetaminophen , alum & mag hydroxide-simeth, haloperidol  **AND** diphenhydrAMINE , haloperidol  lactate **AND** diphenhydrAMINE  **AND** LORazepam , haloperidol  lactate **AND** diphenhydrAMINE  **AND** LORazepam , hydrOXYzine , magnesium  hydroxide    3. Medical Issues Being Addressed:   -- Seizure disorder as above  Labs reviewed, unremarkable with the exception of: LDL 134, HIV antibody reactive, UDS positive for cannabis   4. Discharge Planning:   -- Social work and case management to assist with discharge planning and identification of hospital follow-up needs prior to discharge  -- Estimated LOS: 7 to 10 days  -- Discharge Concerns: Need to establish a safety plan; Medication compliance and effectiveness  -- Discharge Goals: Return home with outpatient referrals for mental health follow-up including medication management/psychotherapy  5. Short Term Goals:  Improve ability to identify changes in lifestyle to reduce recurrence of condition, verbalize feelings, disclose and discuss suicidal ideas, demonstrate self-control, identify and develop effective coping behaviors, compliance with prescribed medications, identify triggers associated with substance abuse/mental health issues, participate in unit milieu and in scheduled group therapies   6. Long Term Goals: Improvement in symptoms so the patient is ready for discharge   --The risks/benefits/side-effects/alternatives to the medications above were discussed in detail with the patient and time was given for questions. The patient provided informed consent.   -- Metabolic profile and EKG monitoring obtained while on an atypical antipsychotic and listed in the EHR    Total Time Spent  in Direct Patient Care:  I personally spent 35 minutes on the  unit in direct patient care. The direct patient care time included face-to-face time with the patient, reviewing the patient's chart, communicating with other professionals, and coordinating care. Greater than 50% of this time was spent in counseling or coordinating care with the patient regarding goals of hospitalization, psycho-education, and discharge planning needs.      Clair Crews, MD Psychiatrist  02/20/2024, 1:52 PM   I certify that inpatient services furnished can reasonably be expected to improve the patient's condition.    Portions of this note were created using voice recognition software. Minor syntax errors, grammatical content, spelling, or punctuation errors may have occurred unintentionally. Please notify the [facility] if the meaning of any statement is unclear.

## 2024-02-20 NOTE — BH IP Treatment Plan (Signed)
 Interdisciplinary Treatment and Diagnostic Plan Update  02/20/2024 Time of Session: 12:10 PM - UPDATE Dylan Moon MRN: [id number]  Principal Diagnosis: Schizoaffective disorder (HCC)  Secondary Diagnoses: Principal Problem:   Schizoaffective disorder (HCC)   Current Medications:  Current Facility-Administered Medications  Medication Dose Route Frequency Provider Last Rate Last Admin   acetaminophen  (TYLENOL ) tablet 650 mg  650 mg Oral Q6H PRN Bobbitt, Shalon E, NP       alum & mag hydroxide-simeth (MAALOX/MYLANTA) 200-200-20 MG/5ML suspension 30 mL  30 mL Oral Q4H PRN Bobbitt, Shalon E, NP       haloperidol  (HALDOL ) tablet 5 mg  5 mg Oral TID PRN Bobbitt, Shalon E, NP       And   diphenhydrAMINE  (BENADRYL ) capsule 50 mg  50 mg Oral TID PRN Bobbitt, Shalon E, NP       haloperidol  lactate (HALDOL ) injection 5 mg  5 mg Intramuscular TID PRN Bobbitt, Shalon E, NP       And   diphenhydrAMINE  (BENADRYL ) injection 50 mg  50 mg Intramuscular TID PRN Bobbitt, Shalon E, NP       And   LORazepam  (ATIVAN ) injection 2 mg  2 mg Intramuscular TID PRN Bobbitt, Shalon E, NP       haloperidol  lactate (HALDOL ) injection 10 mg  10 mg Intramuscular TID PRN Bobbitt, Shalon E, NP       And   diphenhydrAMINE  (BENADRYL ) injection 50 mg  50 mg Intramuscular TID PRN Bobbitt, Shalon E, NP       And   LORazepam  (ATIVAN ) injection 2 mg  2 mg Intramuscular TID PRN Bobbitt, Shalon E, NP       divalproex  (DEPAKOTE ) DR tablet 750 mg  750 mg Oral Q12H Bobbitt, Shalon E, NP   750 mg at 02/20/24 7829   Eslicarbazepine Acetate  TABS 1,600 mg  1,600 mg Oral Daily Zouev, Dmitri, MD   1,600 mg at 02/20/24 0744   hydrOXYzine  (ATARAX ) tablet 25 mg  25 mg Oral TID PRN Bobbitt, Shalon E, NP       levOCARNitine  (CARNITOR ) 1 GM/10ML solution 1,000 mg  1,000 mg Oral TID Smiley Dung, RPH   1,000 mg at 02/20/24 0744   magnesium  hydroxide (MILK OF MAGNESIA) suspension 30 mL  30 mL Oral Daily PRN Bobbitt, Shalon E, NP        paliperidone  (INVEGA ) 24 hr tablet 3 mg  3 mg Oral Daily Parker, Alvin S, MD   3 mg at 02/20/24 5621   traZODone  (DESYREL ) tablet 50 mg  50 mg Oral QHS Parker, Alvin S, MD   50 mg at 02/19/24 2101   zonisamide  (ZONEGRAN ) capsule 300 mg  300 mg Oral Daily Zouev, Dmitri, MD   300 mg at 02/20/24 0807   PTA Medications: Medications Prior to Admission  Medication Sig Dispense Refill Last Dose/Taking   divalproex  (DEPAKOTE ) 500 MG DR tablet Take 1,000 mg by mouth at bedtime.   Past Week   divalproex  (DEPAKOTE ) 500 MG DR tablet Take 500 mg by mouth daily.   Past Week   ergocalciferol  (VITAMIN D2) 1.25 MG (50000 UT) capsule Take 50,000 Units by mouth every Wednesday.   Past Month   escitalopram  (LEXAPRO ) 10 MG tablet Take 1 tablet (10 mg total) by mouth daily. 30 tablet 1 Past Week   Eslicarbazepine Acetate  (APTIOM ) 800 MG TABS Take 1,600 mg by mouth daily. 60 tablet 0 Past Week   hydrOXYzine  (ATARAX ) 25 MG tablet Take 1 tablet (25 mg total) by  mouth 3 (three) times daily as needed for anxiety. 90 tablet 0 Past Week   lactulose  (CHRONULAC ) 10 GM/15ML solution Take 30 g by mouth at bedtime.   Past Week   levOCARNitine  (CARNITOR ) 1 GM/10ML solution Take 1,000 mg by mouth 3 (three) times daily.   Past Week   NAYZILAM  5 MG/0.1ML SOLN Place 5 mg into the nose daily as needed (seizure lasting more than 2 min and call 911).   Taking As Needed   paliperidone  (INVEGA ) 6 MG 24 hr tablet Take 6 mg by mouth every morning.   Past Week   traZODone  (DESYREL ) 50 MG tablet Take 1 tablet (50 mg total) by mouth at bedtime as needed for sleep. 30 tablet 1 Past Week   zonisamide  (ZONEGRAN ) 100 MG capsule Take 3 capsules (300 mg total) by mouth daily. 90 capsule 0 Past Week   paliperidone  (INVEGA  SUSTENNA) 234 MG/1.5ML injection Inject 234 mg into the muscle every 28 (twenty-eight) days. 1.5 mL 3     Patient Stressors: Health problems   Medication change or noncompliance   Substance abuse    Patient Strengths: Capable  of independent living  Communication skills  Supportive family/friends   Treatment Modalities: Medication Management, Group therapy, Case management,  1 to 1 session with clinician, Psychoeducation, Recreational therapy.   Physician Treatment Plan for Primary Diagnosis: Schizoaffective disorder (HCC) Long Term Goal(s):     Short Term Goals:    Medication Management: Evaluate patient's response, side effects, and tolerance of medication regimen.  Therapeutic Interventions: 1 to 1 sessions, Unit Group sessions and Medication administration.  Evaluation of Outcomes: Progressing  Physician Treatment Plan for Secondary Diagnosis: Principal Problem:   Schizoaffective disorder (HCC)  Long Term Goal(s):     Short Term Goals:       Medication Management: Evaluate patient's response, side effects, and tolerance of medication regimen.  Therapeutic Interventions: 1 to 1 sessions, Unit Group sessions and Medication administration.  Evaluation of Outcomes: Progressing   RN Treatment Plan for Primary Diagnosis: Schizoaffective disorder (HCC) Long Term Goal(s): Knowledge of disease and therapeutic regimen to maintain health will improve  Short Term Goals: Ability to remain free from injury will improve, Ability to verbalize frustration and anger appropriately will improve, Ability to verbalize feelings will improve, and Ability to disclose and discuss suicidal ideas  Medication Management: RN will administer medications as ordered by provider, will assess and evaluate patient's response and provide education to patient for prescribed medication. RN will report any adverse and/or side effects to prescribing provider.  Therapeutic Interventions: 1 on 1 counseling sessions, Psychoeducation, Medication administration, Evaluate responses to treatment, Monitor vital signs and CBGs as ordered, Perform/monitor CIWA, COWS, AIMS and Fall Risk screenings as ordered, Perform wound care treatments as  ordered.  Evaluation of Outcomes: Progressing   LCSW Treatment Plan for Primary Diagnosis: Schizoaffective disorder (HCC) Long Term Goal(s): Safe transition to appropriate next level of care at discharge, Engage patient in therapeutic group addressing interpersonal concerns.  Short Term Goals: Engage patient in aftercare planning with referrals and resources, Increase ability to appropriately verbalize feelings, Facilitate acceptance of mental health diagnosis and concerns, and Identify triggers associated with mental health/substance abuse issues  Therapeutic Interventions: Assess for all discharge needs, 1 to 1 time with Social worker, Explore available resources and support systems, Assess for adequacy in community support network, Educate family and significant other(s) on suicide prevention, Complete Psychosocial Assessment, Interpersonal group therapy.  Evaluation of Outcomes: Progressing   Progress in Treatment: Attending groups:  attended some groups Participating in groups: Yes. Taking medication as prescribed: Yes. Toleration medication: Yes. Family/Significant other contact made: Yes, contacted Earlene Gleason (girlfriend) [phone] Patient understands diagnosis: Yes. Discussing patient identified problems/goals with staff: Yes. Medical problems stabilized or resolved: Yes. Denies suicidal/homicidal ideation: Yes. Issues/concerns per patient self-inventory: No.   New problem(s) identified: No, Describe:  none   New Short Term/Long Term Goal(s):medication stabilization, elimination of SI thoughts, development of comprehensive mental wellness plan.    Patient Goals: "I don't know..my grandfather passed away and I would like to get to his funeral"   Discharge Plan or Barriers: Patient recently admitted. CSW will continue to follow and assess for appropriate referrals and possible discharge planning.      Reason for Continuation of Hospitalization: Medication stabilization Other;  describe mood stabilization, discharge planning   Estimated Length of Stay:  1 - 2 days  Last 3 [place] Suicide Severity Risk Score: Flowsheet Row [facility] (Current) [facility] from [facility]15/2024 in [facility] from 11/15/2023 in [facility]  C-SSRS RISK CATEGORY No Risk No Risk No Risk       Last [facility] 2/9 Scores:    10/21/2023    4:29 PM 08/06/2023   11:44 AM 11/13/2021   10:45 AM  Depression screen PHQ 2/9  Decreased Interest 2 2 1   Down, Depressed, Hopeless 1 0 0  PHQ - 2 Score 3 2 1   Altered sleeping 1 1   Tired, decreased energy 2 1   Change in appetite 1 1   Feeling bad or failure about yourself  2 0   Trouble concentrating 3 3   Moving slowly or fidgety/restless 2 1   Suicidal thoughts 1 1   PHQ-9 Score 15 10   Difficult doing work/chores Somewhat difficult Not difficult at all     Scribe for Treatment Team: Coyt Govoni O Jeraldean Wechter, LCSWA 02/20/2024 3:33 PM

## 2024-02-21 ENCOUNTER — Encounter (HOSPITAL_COMMUNITY): Payer: Self-pay | Admitting: Nurse Practitioner

## 2024-02-21 DIAGNOSIS — F1220 Cannabis dependence, uncomplicated: Secondary | ICD-10-CM

## 2024-02-21 DIAGNOSIS — F250 Schizoaffective disorder, bipolar type: Secondary | ICD-10-CM

## 2024-02-21 DIAGNOSIS — F630 Pathological gambling: Secondary | ICD-10-CM

## 2024-02-21 HISTORY — DX: Cannabis dependence, uncomplicated: F12.20

## 2024-02-21 HISTORY — DX: Pathological gambling: F63.0

## 2024-02-21 MED ORDER — DIVALPROEX SODIUM 500 MG PO DR TAB: 1500.0000 mg | DELAYED_RELEASE_TABLET | Freq: Every day | ORAL | 0 refills | Status: AC

## 2024-02-21 NOTE — [facility] Suicide Risk Assessment (Signed)
 [facility] Discharge Suicide Risk Assessment   Principal Problem: Schizoaffective disorder [facility])  Discharge Diagnoses: Principal Problem:   Schizoaffective disorder (HCC)      Total Time spent with patient: 40  Musculoskeletal: Strength & Muscle Tone: within normal limits Gait & Station: normal Patient leans: N/A   Psychiatric Specialty Exam:  Presentation  General Appearance: Appropriate for Environment  Eye Contact: Good  Speech: Clear and Coherent; Normal Rate  Speech Volume: Normal  Handedness: Right   Mood and Affect  Mood: Euthymic  Affect: Congruent   Thought Process  Thought Processes: Linear  Descriptions of Associations: Intact  Orientation: Full (Time, Place and Person)  Thought Content: Logical  History of Schizophrenia/Schizoaffective disorder: No  Duration of Psychotic Symptoms: NA Hallucinations: Hallucinations: None  Ideas of Reference: None  Suicidal Thoughts: Suicidal Thoughts: No  Homicidal Thoughts: Homicidal Thoughts: No   Sensorium  Memory: Immediate Good  Judgment: Fair  Insight: Fair   Art therapist  Concentration: Good  Attention Span: Good  Recall: Good  Fund of Knowledge: Good  Language: Good   Psychomotor Activity  Psychomotor Activity: Psychomotor Activity: Normal   Assets  Assets: Communication Skills; Social Support; Housing; Leisure Time   Sleep  Sleep: Sleep: Good   Physical Exam: General: Sitting comfortably. NAD. HEENT: Normocephalic, atraumatic, MMM, EMOI Lungs: no increased work of breathing noted Heart: no cyanosis Abdomen: Non distended Musculoskeletal: FROM. No obvious deformities Skin: Warm, dry, intact. No rashes noted Neuro: No obvious focal deficits.  Gait and station are normal  Review of Systems  Constitutional: Negative.   HENT: Negative.    Eyes: Negative.   Respiratory: Negative.    Cardiovascular: Negative.   Gastrointestinal: Negative.   Genitourinary:  Negative.   Skin: Negative.   Neurological: Negative.   Psychiatric/Behavioral:  Negative   Mental Status Per Nursing Assessment: Suicidal ideation indicated by others  Demographic Factors:  Male  Loss Factors: NA  Historical Factors: Family history of mental illness or substance abuse  Risk Reduction Factors:   Sense of responsibility to family, Living with another person, especially a relative, Positive social support, and Positive therapeutic relationship  Continued Clinical Symptoms:  Previous psychiatric diagnoses and treatments  Cognitive Features That Contribute To Risk:  None  Suicide Risk:  Minimal: No identifiable suicidal ideation.  Patients presenting with no risk factors but with morbid ruminations; may be classified as minimal risk based on the severity of the depressive symptoms.    Follow-up Information     Angelia Kelp, NP Follow up.   Specialty: Nurse Practitioner Why: Schedule an appointment as soon as possible for a visit.  Please schedule an appointment with your primary care provider. P: [phone] Contact information: [address] [place] [address] [phone]         [facility] Follow up.   Contact information: [address] [place] [address] [phone]         Krystal Phenix, MD Follow up.   Specialty: Neurology Why: [phone] Contact information: [address] [place] [address] [phone]                  Plan Of Care/Follow-up recommendations:  Activity: as tolerated  Diet: heart healthy  Other: -Follow-up with your outpatient psychiatric provider -instructions on appointment date, time, and address (location) are provided to you in discharge paperwork.  -Take your psychiatric medications as prescribed at discharge - instructions are provided to you in the discharge  paperwork  -Follow-up with outpatient primary care doctor and  other specialists -for management of preventative medicine and chronic medical issues  -Testing: Follow-up with outpatient provider for any abnormal lab results (if any)  -If you are prescribed an atypical antipsychotic medication, we recommend that your outpatient psychiatrist follow routine screening for side effects within 3 months of discharge, including monitoring: AIMS scale, height, weight, blood pressure, fasting lipid panel, HbA1c, and fasting blood sugar.   -Recommend total abstinence from alcohol, tobacco, and other illicit drug use at discharge.   -If your psychiatric symptoms recur, worsen, or if you have side effects to your psychiatric medications, call your outpatient psychiatric provider, [phone] or go to the nearest emergency department.  -If suicidal thoughts occur, immediately call your outpatient psychiatric provider, 911, [phone] or go to the nearest emergency department.   Clair Crews, MD 02/21/24 10:25 AM

## 2024-02-21 NOTE — Progress Notes (Signed)
  [facility] Adult Case Management Discharge Plan :  Will you be returning to the same living situation after discharge:  Yes,  with mother Gaylon Kea At discharge, do you have transportation home?: No. Do you have the ability to pay for your medications: Yes,  has insurance  Release of information consent forms completed and in the chart;  Patient's signature needed at discharge.  Patient to Follow up at:  Follow-up Information     Angelia Kelp, NP Follow up.   Specialty: Nurse Practitioner Why: Schedule an appointment as soon as possible for a visit.  Please schedule an appointment with your primary care provider. P: [phone] Contact information: [address] [place] [address] [phone]         [facility] Follow up.   Contact information: [address] [place] [address] [phone]         Krystal Phenix, MD Follow up.   Specialty: Neurology Why: [phone] Contact information: [address] [place] [address] [phone]                 Next level of care provider has access to [facility] Link:no  Safety Planning and Suicide Prevention discussed: Yes,  Ferdinand Houseman     Has patient been referred to the Quitline?: Patient refused referral for treatment  Patient has been referred for addiction treatment: Patient refused referral for treatment.  Lucrezia Sachs, LCSW 02/21/2024, 9:23 AM

## 2024-02-21 NOTE — Plan of Care (Signed)
 Problem: Education: Goal: Knowledge of [facility] Education information/materials will improve Outcome: Adequate for Discharge Goal: Emotional status will improve Outcome: Adequate for Discharge Goal: Mental status will improve Outcome: Adequate for Discharge Goal: Verbalization of understanding the information provided will improve Outcome: Adequate for Discharge   Problem: Activity: Goal: Interest or engagement in activities will improve Outcome: Adequate for Discharge Goal: Sleeping patterns will improve Outcome: Adequate for Discharge   Problem: Coping: Goal: Ability to verbalize frustrations and anger appropriately will improve Outcome: Adequate for Discharge Goal: Ability to demonstrate self-control will improve Outcome: Adequate for Discharge   Problem: Health Behavior/Discharge Planning: Goal: Identification of resources available to assist in meeting health care needs will improve Outcome: Adequate for Discharge Goal: Compliance with treatment plan for underlying cause of condition will improve Outcome: Adequate for Discharge   Problem: Physical Regulation: Goal: Ability to maintain clinical measurements within normal limits will improve Outcome: Adequate for Discharge   Problem: Safety: Goal: Periods of time without injury will increase Outcome: Adequate for Discharge   Problem: Education: Goal: Knowledge of [facility] Education information/materials will improve Outcome: Adequate for Discharge Goal: Emotional status will improve Outcome: Adequate for Discharge Goal: Mental status will improve Outcome: Adequate for Discharge Goal: Verbalization of understanding the information provided will improve Outcome: Adequate for Discharge   Problem: Activity: Goal: Interest or engagement in activities will improve Outcome: Adequate for Discharge Goal: Sleeping patterns will improve Outcome: Adequate for Discharge   Problem: Coping: Goal:  Ability to verbalize frustrations and anger appropriately will improve Outcome: Adequate for Discharge Goal: Ability to demonstrate self-control will improve Outcome: Adequate for Discharge   Problem: Health Behavior/Discharge Planning: Goal: Identification of resources available to assist in meeting health care needs will improve Outcome: Adequate for Discharge Goal: Compliance with treatment plan for underlying cause of condition will improve Outcome: Adequate for Discharge   Problem: Physical Regulation: Goal: Ability to maintain clinical measurements within normal limits will improve Outcome: Adequate for Discharge   Problem: Safety: Goal: Periods of time without injury will increase Outcome: Adequate for Discharge   Problem: Activity: Goal: Will identify at least one activity in which they can participate Outcome: Adequate for Discharge   Problem: Coping: Goal: Ability to identify and develop effective coping behavior will improve Outcome: Adequate for Discharge Goal: Ability to interact with others will improve Outcome: Adequate for Discharge Goal: Demonstration of participation in decision-making regarding own care will improve Outcome: Adequate for Discharge Goal: Ability to use eye contact when communicating with others will improve Outcome: Adequate for Discharge   Problem: Health Behavior/Discharge Planning: Goal: Identification of resources available to assist in meeting health care needs will improve Outcome: Adequate for Discharge   Problem: Self-Concept: Goal: Will verbalize positive feelings about self Outcome: Adequate for Discharge   Problem: Activity: Goal: Will verbalize the importance of balancing activity with adequate rest periods Outcome: Adequate for Discharge   Problem: Education: Goal: Will be free of psychotic symptoms Outcome: Adequate for Discharge Goal: Knowledge of the prescribed therapeutic regimen will improve Outcome: Adequate for  Discharge   Problem: Coping: Goal: Coping ability will improve Outcome: Adequate for Discharge Goal: Will verbalize feelings Outcome: Adequate for Discharge   Problem: Health Behavior/Discharge Planning: Goal: Compliance with prescribed medication regimen will improve Outcome: Adequate for Discharge   Problem: Nutritional: Goal: Ability to achieve adequate nutritional intake will improve Outcome: Adequate for Discharge   Problem: Role Relationship: Goal: Ability to communicate needs accurately will improve Outcome: Adequate for Discharge  Goal: Ability to interact with others will improve Outcome: Adequate for Discharge   Problem: Safety: Goal: Ability to redirect hostility and anger into socially appropriate behaviors will improve Outcome: Adequate for Discharge Goal: Ability to remain free from injury will improve Outcome: Adequate for Discharge   Problem: Self-Care: Goal: Ability to participate in self-care as condition permits will improve Outcome: Adequate for Discharge   Problem: Self-Concept: Goal: Will verbalize positive feelings about self Outcome: Adequate for Discharge   Problem: Education: Goal: Knowledge of disease or condition will improve Outcome: Adequate for Discharge Goal: Understanding of discharge needs will improve Outcome: Adequate for Discharge   Problem: Health Behavior/Discharge Planning: Goal: Ability to identify changes in lifestyle to reduce recurrence of condition will improve Outcome: Adequate for Discharge Goal: Identification of resources available to assist in meeting health care needs will improve Outcome: Adequate for Discharge

## 2024-02-21 NOTE — Progress Notes (Signed)
 Pt visible in the milieu.  Interacting appropriately with staff and peers.  Pt rating both depression and anxiety at 0.  Denied SI, HI and AVH.  Needs assessed.  Pt denied.  Safety checks continue for patient safety.  Pt safe on unit.

## 2024-02-21 NOTE — Progress Notes (Signed)
   02/21/24 1000  Psych Admission Type (Psych Patients Only)  Admission Status Involuntary  Psychosocial Assessment  Patient Complaints None  Eye Contact Fair  Facial Expression Animated  Affect Appropriate to circumstance  Speech Logical/coherent  Interaction Assertive  Appearance/Hygiene Disheveled  Behavior Characteristics Cooperative  Mood Pleasant  Thought Process  Coherency WDL  Content WDL  Delusions None reported or observed  Perception WDL  Hallucination None reported or observed  Judgment WDL  Confusion None  Danger to Self  Current suicidal ideation? Denies  Agreement Not to Harm Self Yes  Description of Agreement Verbal  Danger to Others  Danger to Others None reported or observed

## 2024-02-21 NOTE — Group Note (Signed)
 Date:  02/21/2024 Time:  9:11 AM  Group Topic/Focus:  Goals Group:   The focus of this group is to help patients establish daily goals to achieve during treatment and discuss how the patient can incorporate goal setting into their daily lives to aide in recovery.    Participation Level:  Did Not Attend    [name] 02/21/2024, 9:11 AM

## 2024-02-21 NOTE — Discharge Summary (Signed)
 Physician Discharge Summary Note  Patient:  Dylan Moon is a 28 y.o. male  MRN:  [id number]  DOB:  12-09-1995  Patient phone: [phone] (home)  Patient address:   [address] [place] [address]   Total Time spent with patient: 13 Minutes  Date of Admission:  02/16/2024  Date of Discharge: 02/21/24   Reason for Admission: Decompensated schizoaffective disorder.  According to the patient's mother and girlfriend he was compliant with his Invega  Sustenna injection, but not with oral medications (paliperidone  and Depakote ).  His Depakote  was subtherapeutic on admission.  Depakote  was subsequently increased to 750 mg twice daily.  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   Past Medical History:  Past Medical History:  Diagnosis Date   ADHD    Gambling disorder, episodic 02/21/2024   Schizoaffective disorder (HCC)    Scoliosis    Seizures (HCC)    most recent 12/02/17   Severe cannabis use disorder (HCC) 02/21/2024   Causes paranoia and psychiatric decompensation     Past Surgical History:  Procedure Laterality Date   NO PAST SURGERIES       Family History:  Family History  Problem Relation Age of Onset   Diabetes Mother    Hypertension Mother    Cancer Other    Diabetes Father    Seizures Maternal Grandfather      Family Psychiatric  History: See H&P  Social History:  Social History   Substance and Sexual Activity  Alcohol Use Not Currently     Social History   Substance and Sexual Activity  Drug Use Yes   Types: Marijuana   Comment: Daily use of marijuana.     Social History   Socioeconomic History    Marital status: Single    Spouse name: Not on file   Number of children: 0   Years of education: HS   Highest education level: Not on file  Occupational History   Occupation: Product manager of videos  Tobacco Use   Smoking status: Some Days    Types: Cigars   Smokeless tobacco: Never  Vaping Use   Vaping status: Never Used  Substance and Sexual Activity   Alcohol use: Not Currently   Drug use: Yes    Types: Marijuana    Comment: Daily use of marijuana.   Sexual activity: Not Currently  Other Topics Concern   Not on file  Social History Narrative   Lives at home his mother.   3-4 sodas per week.   Right-handed.   Social Drivers of Corporate investment banker Strain: Not on file  Food Insecurity: No Food Insecurity (02/16/2024)   Hunger Vital Sign    Worried About Running Out of Food in the Last Year: Never true    Ran Out of Food in the Last Year: Never true  Transportation Needs: Unknown (02/16/2024)   PRAPARE - Administrator, Civil Service (Medical): No    Lack of Transportation (Non-Medical): Patient declined  Physical Activity: Not on file  Stress: Not on file  Social Connections: Not on file     Hospital Course:  During the patient's hospitalization, patient had extensive initial psychiatric evaluation, and follow-up psychiatric evaluations every day.  Psychiatric  diagnoses provided upon initial assessment: Schizoaffective disorder (HCC) [F25.9]   The patient was given on Invega  Sustenna injection of 234 mg.  Paliperidone  level was checked prior to the injection, but is still pending.  Will plan on passing the results to the patient's ACT team when they are available.  Is possible he is a rapid metabolizer of medication.  He was given oral Invega  3 mg daily and Depakote  was increased to 750 mg twice daily.  His level was in the therapeutic range for bipolar disorder, and elevated for seizure disorder.  Recommend he take the medication at 1500 mg  nightly after discharge.  Given the patient's decompensation despite the Invega  Sustenna injection, he may benefit from getting the medication every 3 weeks rather than every 4 weeks.  He was also encouraged to abstain from cannabis and gambling.  Patient's care was discussed during the interdisciplinary team meeting every day during the hospitalization.  The patient denied having side effects to prescribed psychiatric medication.  Gradually, patient started adjusting to milieu. The patient was evaluated each day by a clinical provider to ascertain response to treatment. Improvement was noted by the patient's report of decreasing symptoms, improved sleep and appetite, affect, medication tolerance, behavior, and participation in unit programming.  Patient was asked each day to complete a self inventory noting mood, mental status, pain, new symptoms, anxiety and concerns.    Symptoms were reported as significantly decreased or resolved completely by discharge.   On day of discharge, the patient reports that their mood is stable. The patient denied having suicidal thoughts for more than 48 hours prior to discharge.  Patient denies having homicidal thoughts.  Patient denies having auditory hallucinations.  Patient denies any visual hallucinations or other symptoms of psychosis. The patient was motivated to continue taking medication with a goal of continued improvement in mental health.   The patient reports their target psychiatric symptoms of paranoia responded well to the psychiatric medications and unit programming.  The patient reports overall benefit other psychiatric hospitalization. Supportive psychotherapy was provided to the patient. The patient also participated in regular group therapy while hospitalized. Coping skills, problem solving as well as relaxation therapies were also part of the unit programming.  Labs were reviewed with the patient, and abnormal results were discussed with the  patient.  The patient is able to verbalize their individual safety plan to this provider.    Physical Findings:  AIMS:  Facial and Oral Movements: None Muscles of Facial Expression: None Lips and Perioral Area: None Jaw: None Tongue: None,Extremity Movements Upper (arms, wrists, hands, fingers): None Lower (legs, knees, ankles, toes): None, Trunk Movements Neck, shoulders, hips: None, Global Judgements Severity of abnormal movements overall: None Incapacitation due to abnormal movements: None Patient's awareness of abnormal movements: No Awareness, Dental Status Current problems with teeth and/or dentures: No Does patient usually wear dentures: No Edentia: No   CIWA:   NA  COWS:  NA  Musculoskeletal: Strength & Muscle Tone: within normal limits Gait & Station: normal Patient leans: N/A    Psychiatric Specialty Exam:  Presentation  General Appearance: Appropriate for Environment  Eye Contact: Good  Speech: Clear and Coherent; Normal Rate  Speech Volume: Normal  Handedness: Right   Mood and Affect  Mood: Euthymic  Affect: Congruent   Thought Process  Thought Processes: Linear  Descriptions of Associations: Intact  Orientation: Full (Time, Place and Person)  Thought Content: Logical  History of Schizophrenia/Schizoaffective disorder: No  Duration of Psychotic Symptoms: NA Hallucinations:  Hallucinations: None  Ideas of Reference: None  Suicidal Thoughts: Suicidal Thoughts: No  Homicidal Thoughts: Homicidal Thoughts: No   Sensorium  Memory: Immediate Good  Judgment: Fair  Insight: Fair   Art therapist  Concentration: Good  Attention Span: Good  Recall: Good  Fund of Knowledge: Good  Language: Good   Psychomotor Activity  Psychomotor Activity: Psychomotor Activity: Normal   Assets  Assets: Communication Skills; Social Support; Housing; Leisure Time   Sleep  Sleep: Sleep: Good      Physical Exam: General:  Sitting comfortably. NAD. HEENT: Normocephalic, atraumatic, MMM, EMOI Lungs: no increased work of breathing noted Heart: no cyanosis Abdomen: Non distended Musculoskeletal: FROM. No obvious deformities Skin: Warm, dry, intact. No rashes noted Neuro: No obvious focal deficits.  Gait and station are normal  Review of Systems:  Constitutional: Negative.   HENT: Negative.    Eyes: Negative.   Respiratory: Negative.    Cardiovascular: Negative.   Gastrointestinal: Negative.   Genitourinary: Negative.   Skin: Negative.   Neurological: Negative.   Psychiatric/Behavioral:  Negative  Blood pressure 132/81, pulse 67, temperature 97.7 F (36.5 C), temperature source Oral, resp. rate 18, height 5\' 11"  (1.803 m), weight 92.1 kg, SpO2 100%. Body mass index is 28.31 kg/m.    Social History   Tobacco Use  Smoking Status Some Days   Types: Cigars  Smokeless Tobacco Never     Tobacco Cessation:  A prescription for an FDA approved medication for tobacco cessation was not prescribed because: Patient Refused   Blood Alcohol level:  Lab Results  Component Value Date   [facility] <15 02/15/2024   ETH <10 09/03/2023    Metabolic Disorder Labs:  Lab Results  Component Value Date   HGBA1C 5.2 02/15/2024   MPG 102.54 02/15/2024   MPG 114.02 07/17/2023   Lab Results  Component Value Date   PROLACTIN 40.3 (H) 12/06/2022    Lab Results  Component Value Date   CHOL 198 02/15/2024   TRIG 83 02/15/2024   HDL 47 02/15/2024   VLDL 17 02/15/2024   LDLCALC 134 (H) 02/15/2024   LDLCALC 101 (H) 07/17/2023      See Psychiatric Specialty Exam and Suicide Risk Assessment completed by Attending Physician prior to discharge.  Discharge destination: Home with girlfriend  Is patient on multiple antipsychotic therapies at discharge:  No  Has Patient had three or more failed trials of antipsychotic monotherapy by history: NA Recommended Plan for Multiple Antipsychotic Therapies:  NA   Discharge Instructions     Diet - low sodium heart healthy   Complete by: As directed    Increase activity slowly   Complete by: As directed         Allergies as of 02/21/2024       Reactions   Keppra  [levetiracetam ] Other (See Comments)   Irritability, mental status changes   Tramadol  Other (See Comments)   Mixed with his other meds can cause seizures    Vimpat  [lacosamide ] Other (See Comments)   Causes anger, mental status changes        Medication List     STOP taking these medications    escitalopram  10 MG tablet Commonly known as: LEXAPRO    lactulose  10 GM/15ML solution Commonly known as: CHRONULAC        TAKE these medications      Indication  Aptiom  800 MG Tabs Generic drug: Eslicarbazepine Acetate  Take 1,600 mg by mouth daily.  Indication: Focal Epilepsy   divalproex  500 MG DR tablet Commonly  known as: DEPAKOTE  Take 3 tablets (1,500 mg total) by mouth at bedtime. What changed:  how much to take Another medication with the same name was removed. Continue taking this medication, and follow the directions you see here.  Indication: Absence Seizure Disorder, Manic Phase of Manic-Depression   ergocalciferol  1.25 MG (50000 UT) capsule Commonly known as: VITAMIN D2 Take 50,000 Units by mouth every Wednesday.  Indication: Vitamin D  Deficiency   hydrOXYzine  25 MG tablet Commonly known as: ATARAX  Take 1 tablet (25 mg total) by mouth 3 (three) times daily as needed for anxiety.  Indication: Feeling Anxious   levOCARNitine  1 GM/10ML solution Commonly known as: CARNITOR  Take 1,000 mg by mouth 3 (three) times daily.  Indication: Per doctor's instructions   Nayzilam  5 MG/0.1ML Soln Generic drug: Midazolam  Place 5 mg into the nose daily as needed (seizure lasting more than 2 min and call 911).  Indication: Seizure   paliperidone  234 MG/1.5ML injection Commonly known as: INVEGA  SUSTENNA Inject 234 mg into the muscle every 28 (twenty-eight)  days.  Indication: Bipolar w/ paranoia   paliperidone  6 MG 24 hr tablet Commonly known as: INVEGA  Take 6 mg by mouth every morning.  Indication: Schizoaffective Disorder   traZODone  50 MG tablet Commonly known as: DESYREL  Take 1 tablet (50 mg total) by mouth at bedtime as needed for sleep.  Indication: Trouble Sleeping   zonisamide  100 MG capsule Commonly known as: ZONEGRAN  Take 3 capsules (300 mg total) by mouth daily.  Indication: Focal Epilepsy          Follow-up Information     Angelia Kelp, NP Follow up.   Specialty: Nurse Practitioner Why: Schedule an appointment as soon as possible for a visit.  Please schedule an appointment with your primary care provider. P: [phone] Contact information: [address] [place] [address] [phone]         [facility] Follow up.   Contact information: [address] [place] [address] [phone]         Krystal Phenix, MD Follow up.   Specialty: Neurology Why: [phone] Contact information: [address] [place] [address] [phone]                    Follow-up recommendations:  - It is recommended to the patient to continue psychiatric medications as prescribed, after discharge from the hospital.   - It is recommended to the patient to follow up with your outpatient psychiatric provider and PCP. - It was discussed with the patient, the impact of alcohol, drugs, tobacco have been there overall psychiatric and medical wellbeing, and total abstinence from substance use was recommended the patient. - Prescriptions provided or sent directly to preferred pharmacy at discharge. Patient agreeable to plan. Given opportunity to ask questions. Appears to feel comfortable with discharge.   - In the event of worsening symptoms, the patient is instructed to call the crisis hotline, 911 and or go to the nearest ED for appropriate evaluation  and treatment of symptoms. To follow-up with primary care provider for other medical issues, concerns and or health care needs - Patient was discharged home with a plan to follow up as noted above.   Comments:  NA  Signed: Clair Crews, MD 02/21/24 10:39 AM

## 2024-02-21 NOTE — Progress Notes (Signed)
 Nurs Dischg Note:   D:Patient denies SI/HI/AVH at this time. Pt appears calm and cooperative, and no distress noted.   A: All Personal items in locker returned to pt and inventory form was signed. [name] home medication was returned. Pt given AVS/ SRA / BH Transition/ safety plan.  Pt escorted to the lobby and mother was present to pick him up.   R:  Pt States he will follow up with outpatient / other services, and take MEDS as prescribed.

## 2024-02-24 LAB — MISC LABCORP TEST (SEND OUT): Labcorp test code: 790856

## 2024-02-25 ENCOUNTER — Other Ambulatory Visit (HOSPITAL_COMMUNITY): Payer: Self-pay | Admitting: Physician Assistant

## 2024-02-25 DIAGNOSIS — R112 Nausea with vomiting, unspecified: Secondary | ICD-10-CM

## 2024-03-04 ENCOUNTER — Encounter (HOSPITAL_COMMUNITY): Payer: Self-pay | Admitting: Emergency Medicine

## 2024-03-04 ENCOUNTER — Emergency Department (HOSPITAL_COMMUNITY)
Admission: EM | Admit: 2024-03-04 | Discharge: 2024-03-05 | Disposition: A | Discharge status: Other Acute Inpatient Hospital | Payer: MEDICAID | Attending: Emergency Medicine | Admitting: Emergency Medicine

## 2024-03-04 ENCOUNTER — Other Ambulatory Visit: Payer: Self-pay

## 2024-03-04 DIAGNOSIS — F3163 Bipolar disorder, current episode mixed, severe, without psychotic features: Secondary | ICD-10-CM | POA: Diagnosis not present

## 2024-03-04 DIAGNOSIS — F29 Unspecified psychosis not due to a substance or known physiological condition: Secondary | ICD-10-CM | POA: Insufficient documentation

## 2024-03-04 DIAGNOSIS — F411 Generalized anxiety disorder: Secondary | ICD-10-CM | POA: Diagnosis present

## 2024-03-04 DIAGNOSIS — F319 Bipolar disorder, unspecified: Secondary | ICD-10-CM | POA: Diagnosis present

## 2024-03-04 DIAGNOSIS — F23 Brief psychotic disorder: Secondary | ICD-10-CM

## 2024-03-04 DIAGNOSIS — Z79899 Other long term (current) drug therapy: Secondary | ICD-10-CM | POA: Diagnosis not present

## 2024-03-04 DIAGNOSIS — R451 Restlessness and agitation: Secondary | ICD-10-CM | POA: Insufficient documentation

## 2024-03-04 DIAGNOSIS — F259 Schizoaffective disorder, unspecified: Secondary | ICD-10-CM | POA: Diagnosis not present

## 2024-03-04 LAB — CBC WITH DIFFERENTIAL/PLATELET
Abs Immature Granulocytes: 0.01 10*3/uL (ref 0.00–0.07)
Basophils Absolute: 0 10*3/uL (ref 0.0–0.1)
Basophils Relative: 0 %
Eosinophils Absolute: 0 10*3/uL (ref 0.0–0.5)
Eosinophils Relative: 1 %
HCT: 46.9 % (ref 39.0–52.0)
Hemoglobin: 15.2 g/dL (ref 13.0–17.0)
Immature Granulocytes: 0 %
Lymphocytes Relative: 46 %
Lymphs Abs: 1.8 10*3/uL (ref 0.7–4.0)
MCH: 28.1 pg (ref 26.0–34.0)
MCHC: 32.4 g/dL (ref 30.0–36.0)
MCV: 86.7 fL (ref 80.0–100.0)
Monocytes Absolute: 0.4 10*3/uL (ref 0.1–1.0)
Monocytes Relative: 9 %
Neutro Abs: 1.7 10*3/uL (ref 1.7–7.7)
Neutrophils Relative %: 44 %
Platelets: 202 10*3/uL (ref 150–400)
RBC: 5.41 MIL/uL (ref 4.22–5.81)
RDW: 13.3 % (ref 11.5–15.5)
WBC: 3.9 10*3/uL — ABNORMAL LOW (ref 4.0–10.5)
nRBC: 0 % (ref 0.0–0.2)

## 2024-03-04 LAB — COMPREHENSIVE METABOLIC PANEL WITH GFR
ALT: 14 U/L (ref 0–44)
AST: 15 U/L (ref 15–41)
Albumin: 4.5 g/dL (ref 3.5–5.0)
Alkaline Phosphatase: 42 U/L (ref 38–126)
Anion gap: 9 (ref 5–15)
BUN: 9 mg/dL (ref 6–20)
CO2: 23 mmol/L (ref 22–32)
Calcium: 9.5 mg/dL (ref 8.9–10.3)
Chloride: 104 mmol/L (ref 98–111)
Creatinine, Ser: 0.89 mg/dL (ref 0.61–1.24)
GFR, Estimated: >60 mL/min (ref >60)
Glucose, Bld: 107 mg/dL — ABNORMAL HIGH (ref 70–99)
Potassium: 3.9 mmol/L (ref 3.5–5.1)
Sodium: 136 mmol/L (ref 135–145)
Total Bilirubin: 0.8 mg/dL (ref 0.0–1.2)
Total Protein: 7.3 g/dL (ref 6.5–8.1)

## 2024-03-04 LAB — CBG MONITORING, ED: Glucose-Capillary: 100 mg/dL — ABNORMAL HIGH (ref 70–99)

## 2024-03-04 LAB — ETHANOL: Alcohol, Ethyl (B): <15 mg/dL (ref <15)

## 2024-03-04 NOTE — Progress Notes (Signed)
 Inpatient Psychiatric Referral  Patient was recommended inpatient per [facility], NP. There are no available beds at [facility], per [facility] Shelbie Dess, RN. Patient was referred to the following out of network facilities:   Destination  Service Provider Request Status Address Phone Fax  [facility]  Pending - Request Sent [address] [place] [address] [phone]  [facility]  Pending - Request Sent [address] [place]ella Naas [place] [address] [phone]  [facility]  Pending - Request Sent [address] [place] [address] [phone]  [facility]  Pending - Request Sent [address], [place] [address] [phone]  [facility]  Pending - Request Sent [address] Shelva Dice [place] [address] [phone]  [facility]  Pending - Request Sent [address] [place] [address] [phone]  [facility]  Pending - Request Sent [address] Sharren Decree [place] [phone]  [facility]  Pending - Request Sent [address] [place] [address] [phone]   Situation ongoing, CSW to continue following and update chart as more information becomes available.   Albertus Alt MSW, LCSWA 03/04/2024  10:25PM

## 2024-03-04 NOTE — ED Notes (Signed)
Patient is on the phone with his mother 

## 2024-03-04 NOTE — ED Triage Notes (Signed)
 Patient brought in by [facility] with IVC. Mother took out papers stating patient is hyper-paranoid.

## 2024-03-04 NOTE — ED Notes (Addendum)
 Patient ran out of EMS bay. Security called.
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
## 2024-03-04 NOTE — ED Notes (Signed)
 Snack given.

## 2024-03-04 NOTE — Progress Notes (Signed)
 Pt has been accepted to [facility] for 6/13 Bed assignment: Main campus  Pt meets inpatient criteria per Chandra Come, NP.   Attending Physician will be Lavona Pounds, MD  Report can be called to: [phone]   Pt can arrive after 8 AM  Care Team Notified: Junior Rimando, RN
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
## 2024-03-05 LAB — RAPID URINE DRUG SCREEN, HOSP PERFORMED
Amphetamines: NOT DETECTED
Barbiturates: NOT DETECTED
Benzodiazepines: NOT DETECTED
Cocaine: NOT DETECTED
Opiates: NOT DETECTED
Tetrahydrocannabinol: POSITIVE — AB

## 2024-03-05 NOTE — ED Notes (Signed)
 Report given to Pervis Hocking RN at [facility].

## 2024-03-05 NOTE — ED Notes (Signed)
Pt was given breakfast tray 

## 2024-03-05 NOTE — ED Provider Notes (Signed)
 Emergency Medicine Observation Re-evaluation Note  Dylan Moon is a 28 y.o. male, seen on rounds today.  Pt initially presented to the ED for complaints of Psychiatric Evaluation Currently, the patient is resting.  Physical Exam  BP 111/72   Pulse 61   Temp (!) 97.5 F (36.4 C) (Oral)   Resp 17   Ht 5' 11 (1.803 m)   Wt 92.1 kg   SpO2 100%   BMI 28.32 kg/m  Physical Exam General: NAD   ED Course / MDM  EKG:   I have reviewed the labs performed to date as well as medications administered while in observation.  Recent changes in the last 24 hours include no acute events reported.  Plan  Current plan is for psych placement - accepted [facility], Dr. Atha Blanco, Herma Longest, MD 03/05/24 [phone]

## 2024-03-05 NOTE — ED Notes (Signed)
 Called Sheriff about transporting patient to [facility] and he said it will be after 10am
